# Patient Record
Sex: Male | Born: 1950
Health system: Southern US, Community
[De-identification: ages and names within clinical notes are randomized; demographics above are authoritative.]

## PROBLEM LIST (undated history)

## (undated) DIAGNOSIS — K219 Gastro-esophageal reflux disease without esophagitis: Secondary | ICD-10-CM

## (undated) DIAGNOSIS — Z950 Presence of cardiac pacemaker: Secondary | ICD-10-CM

## (undated) DIAGNOSIS — E7800 Pure hypercholesterolemia, unspecified: Secondary | ICD-10-CM

## (undated) DIAGNOSIS — I10 Essential (primary) hypertension: Secondary | ICD-10-CM

## (undated) DIAGNOSIS — I4891 Unspecified atrial fibrillation: Secondary | ICD-10-CM

## (undated) DIAGNOSIS — E669 Obesity, unspecified: Secondary | ICD-10-CM

## (undated) DIAGNOSIS — C801 Malignant (primary) neoplasm, unspecified: Secondary | ICD-10-CM

## (undated) HISTORY — PX: CHOLECYSTECTOMY: SHX55

## (undated) HISTORY — PX: APPENDECTOMY: SHX54

## (undated) HISTORY — PX: ESOPHAGEAL DILATION: SHX303

## (undated) HISTORY — PX: JOINT REPLACEMENT: SHX530

---

## 1999-03-16 ENCOUNTER — Encounter: Payer: Self-pay | Admitting: Internal Medicine

## 1999-03-16 ENCOUNTER — Ambulatory Visit (HOSPITAL_COMMUNITY): Admission: RE | Admit: 1999-03-16 | Discharge: 1999-03-16 | Payer: Self-pay | Admitting: Internal Medicine

## 2001-03-24 ENCOUNTER — Ambulatory Visit (HOSPITAL_COMMUNITY): Admission: RE | Admit: 2001-03-24 | Discharge: 2001-03-24 | Payer: Self-pay | Admitting: Internal Medicine

## 2001-03-25 ENCOUNTER — Encounter (INDEPENDENT_AMBULATORY_CARE_PROVIDER_SITE_OTHER): Payer: Self-pay | Admitting: Internal Medicine

## 2001-03-25 ENCOUNTER — Ambulatory Visit (HOSPITAL_COMMUNITY): Admission: RE | Admit: 2001-03-25 | Discharge: 2001-03-25 | Payer: Self-pay | Admitting: Internal Medicine

## 2004-09-01 ENCOUNTER — Ambulatory Visit: Payer: Self-pay | Admitting: Family Medicine

## 2004-09-04 ENCOUNTER — Ambulatory Visit (HOSPITAL_COMMUNITY): Admission: RE | Admit: 2004-09-04 | Discharge: 2004-09-04 | Payer: Self-pay | Admitting: Family Medicine

## 2004-09-07 ENCOUNTER — Ambulatory Visit: Payer: Self-pay | Admitting: Family Medicine

## 2004-10-03 ENCOUNTER — Ambulatory Visit: Payer: Self-pay | Admitting: Family Medicine

## 2005-06-14 ENCOUNTER — Emergency Department (HOSPITAL_COMMUNITY): Admission: EM | Admit: 2005-06-14 | Discharge: 2005-06-14 | Payer: Self-pay | Admitting: Emergency Medicine

## 2009-07-21 ENCOUNTER — Emergency Department (HOSPITAL_COMMUNITY): Admission: EM | Admit: 2009-07-21 | Discharge: 2009-07-21 | Payer: Self-pay | Admitting: Emergency Medicine

## 2009-07-31 ENCOUNTER — Emergency Department (HOSPITAL_COMMUNITY): Admission: EM | Admit: 2009-07-31 | Discharge: 2009-07-31 | Payer: Self-pay | Admitting: Emergency Medicine

## 2010-05-15 ENCOUNTER — Emergency Department (HOSPITAL_COMMUNITY)
Admission: EM | Admit: 2010-05-15 | Discharge: 2010-05-15 | Payer: Self-pay | Source: Home / Self Care | Admitting: Emergency Medicine

## 2010-05-22 LAB — URINE CULTURE
Colony Count: NO GROWTH
Culture  Setup Time: 201201102223
Culture: NO GROWTH

## 2010-05-22 LAB — COMPREHENSIVE METABOLIC PANEL
ALT: 17 U/L (ref 0–53)
AST: 25 U/L (ref 0–37)
Albumin: 3.9 g/dL (ref 3.5–5.2)
Alkaline Phosphatase: 94 U/L (ref 39–117)
BUN: 11 mg/dL (ref 6–23)
CO2: 29 mEq/L (ref 19–32)
Calcium: 9.6 mg/dL (ref 8.4–10.5)
Chloride: 99 mEq/L (ref 96–112)
Creatinine, Ser: 0.69 mg/dL (ref 0.4–1.5)
GFR calc Af Amer: >60 mL/min (ref >60)
GFR calc non Af Amer: >60 mL/min (ref >60)
Glucose, Bld: 102 mg/dL — ABNORMAL HIGH (ref 70–99)
Potassium: 3.7 mEq/L (ref 3.5–5.1)
Sodium: 138 mEq/L (ref 135–145)
Total Bilirubin: 0.7 mg/dL (ref 0.3–1.2)
Total Protein: 8.5 g/dL — ABNORMAL HIGH (ref 6.0–8.3)

## 2010-05-22 LAB — URINALYSIS, ROUTINE W REFLEX MICROSCOPIC
Bilirubin Urine: NEGATIVE
Hgb urine dipstick: NEGATIVE
Ketones, ur: NEGATIVE mg/dL
Nitrite: NEGATIVE
Protein, ur: NEGATIVE mg/dL
Specific Gravity, Urine: 1.015 (ref 1.005–1.030)
Urine Glucose, Fasting: NEGATIVE mg/dL
Urobilinogen, UA: 1 mg/dL (ref 0.0–1.0)
pH: 5.5 (ref 5.0–8.0)

## 2010-05-22 LAB — APTT: aPTT: 37 seconds (ref 24–37)

## 2010-05-22 LAB — CBC
HCT: 45.3 % (ref 39.0–52.0)
Hemoglobin: 15.7 g/dL (ref 13.0–17.0)
MCH: 28.1 pg (ref 26.0–34.0)
MCHC: 34.7 g/dL (ref 30.0–36.0)
MCV: 81.2 fL (ref 78.0–100.0)
Platelets: 325 10*3/uL (ref 150–400)
RBC: 5.58 MIL/uL (ref 4.22–5.81)
RDW: 14 % (ref 11.5–15.5)
WBC: 9 10*3/uL (ref 4.0–10.5)

## 2010-05-22 LAB — DIFFERENTIAL
Basophils Absolute: 0.1 10*3/uL (ref 0.0–0.1)
Basophils Relative: 1 % (ref 0–1)
Eosinophils Absolute: 0.1 10*3/uL (ref 0.0–0.7)
Eosinophils Relative: 1 % (ref 0–5)
Lymphocytes Relative: 38 % (ref 12–46)
Lymphs Abs: 3.4 10*3/uL (ref 0.7–4.0)
Monocytes Absolute: 0.6 10*3/uL (ref 0.1–1.0)
Monocytes Relative: 7 % (ref 3–12)
Neutro Abs: 4.9 10*3/uL (ref 1.7–7.7)
Neutrophils Relative %: 54 % (ref 43–77)

## 2010-05-22 LAB — PROTIME-INR
INR: 1.01 (ref 0.00–1.49)
Prothrombin Time: 13.5 seconds (ref 11.6–15.2)

## 2010-07-30 LAB — URINALYSIS, ROUTINE W REFLEX MICROSCOPIC
Bilirubin Urine: NEGATIVE
Glucose, UA: NEGATIVE mg/dL
Hgb urine dipstick: NEGATIVE
Ketones, ur: NEGATIVE mg/dL
Nitrite: NEGATIVE
Protein, ur: NEGATIVE mg/dL
Specific Gravity, Urine: 1.02 (ref 1.005–1.030)
Urobilinogen, UA: 2 mg/dL — ABNORMAL HIGH (ref 0.0–1.0)
pH: 6.5 (ref 5.0–8.0)

## 2011-08-17 ENCOUNTER — Emergency Department (HOSPITAL_COMMUNITY)
Admission: EM | Admit: 2011-08-17 | Discharge: 2011-08-17 | Disposition: A | Discharge status: Home / Self Care | Payer: Self-pay | Attending: Emergency Medicine | Admitting: Emergency Medicine

## 2011-08-17 ENCOUNTER — Encounter (HOSPITAL_COMMUNITY): Payer: Self-pay | Admitting: Emergency Medicine

## 2011-08-17 DIAGNOSIS — M542 Cervicalgia: Secondary | ICD-10-CM | POA: Insufficient documentation

## 2011-08-17 DIAGNOSIS — T148XXA Other injury of unspecified body region, initial encounter: Secondary | ICD-10-CM | POA: Insufficient documentation

## 2011-08-17 DIAGNOSIS — M25519 Pain in unspecified shoulder: Secondary | ICD-10-CM | POA: Insufficient documentation

## 2011-08-17 DIAGNOSIS — X503XXA Overexertion from repetitive movements, initial encounter: Secondary | ICD-10-CM | POA: Insufficient documentation

## 2011-08-17 MED ORDER — CYCLOBENZAPRINE HCL 10 MG PO TABS: ORAL_TABLET | ORAL | Status: DC

## 2011-08-17 MED ORDER — PREDNISONE 20 MG PO TABS
60.0000 mg | ORAL_TABLET | Freq: Once | ORAL | Status: AC
  Administered 2011-08-17: 60 mg via ORAL
  Filled 2011-08-17: qty 3

## 2011-08-17 MED ORDER — PREDNISONE 50 MG PO TABS: ORAL_TABLET | ORAL | Status: DC

## 2011-08-17 MED ORDER — CYCLOBENZAPRINE HCL 10 MG PO TABS
10.0000 mg | ORAL_TABLET | Freq: Once | ORAL | Status: AC
  Administered 2011-08-17: 10 mg via ORAL
  Filled 2011-08-17: qty 1

## 2011-08-17 NOTE — ED Notes (Signed)
Pt c/o stiff neck and shoulder pain since mowing his yard Wed.

## 2011-08-17 NOTE — Discharge Instructions (Signed)
Cryotherapy Cryotherapy means treatment with cold. Ice or gel packs can be used to reduce both pain and swelling. Ice is the most helpful within the first 24 to 48 hours after an injury or flareup from overusing a muscle or joint. Sprains, strains, spasms, burning pain, shooting pain, and aches can all be eased with ice. Ice can also be used when recovering from surgery. Ice is effective, has very few side effects, and is safe for most people to use. PRECAUTIONS  Ice is not a safe treatment option for people with:  Raynaud's phenomenon. This is a condition affecting small blood vessels in the extremities. Exposure to cold may cause your problems to return.   Cold hypersensitivity. There are many forms of cold hypersensitivity, including:   Cold urticaria. Red, itchy hives appear on the skin when the tissues begin to warm after being iced.   Cold erythema. This is a red, itchy rash caused by exposure to cold.   Cold hemoglobinuria. Red blood cells break down when the tissues begin to warm after being iced. The hemoglobin that carry oxygen are passed into the urine because they cannot combine with blood proteins fast enough.   Numbness or altered sensitivity in the area being iced.  If you have any of the following conditions, do not use ice until you have discussed cryotherapy with your caregiver:  Heart conditions, such as arrhythmia, angina, or chronic heart disease.   High blood pressure.   Healing wounds or open skin in the area being iced.   Current infections.   Rheumatoid arthritis.   Poor circulation.   Diabetes.  Ice slows the blood flow in the region it is applied. This is beneficial when trying to stop inflamed tissues from spreading irritating chemicals to surrounding tissues. However, if you expose your skin to cold temperatures for too long or without the proper protection, you can damage your skin or nerves. Watch for signs of skin damage due to cold. HOME CARE  INSTRUCTIONS Follow these tips to use ice and cold packs safely.  Place a dry or damp towel between the ice and skin. A damp towel will cool the skin more quickly, so you may need to shorten the time that the ice is used.   For a more rapid response, add gentle compression to the ice.   Ice for no more than 10 to 20 minutes at a time. The bonier the area you are icing, the less time it will take to get the benefits of ice.   Check your skin after 5 minutes to make sure there are no signs of a poor response to cold or skin damage.   Rest 20 minutes or more in between uses.   Once your skin is numb, you can end your treatment. You can test numbness by very lightly touching your skin. The touch should be so light that you do not see the skin dimple from the pressure of your fingertip. When using ice, most people will feel these normal sensations in this order: cold, burning, aching, and numbness.   Do not use ice on someone who cannot communicate their responses to pain, such as small children or people with dementia.  HOW TO MAKE AN ICE PACK Ice packs are the most common way to use ice therapy. Other methods include ice massage, ice baths, and cryo-sprays. Muscle creams that cause a cold, tingly feeling do not offer the same benefits that ice offers and should not be used as a substitute  unless recommended by your caregiver. To make an ice pack, do one of the following:  Place crushed ice or a bag of frozen vegetables in a sealable plastic bag. Squeeze out the excess air. Place this bag inside another plastic bag. Slide the bag into a pillowcase or place a damp towel between your skin and the bag.   Mix 3 parts water with 1 part rubbing alcohol. Freeze the mixture in a sealable plastic bag. When you remove the mixture from the freezer, it will be slushy. Squeeze out the excess air. Place this bag inside another plastic bag. Slide the bag into a pillowcase or place a damp towel between your skin  and the bag.  SEEK MEDICAL CARE IF:  You develop white spots on your skin. This may give the skin a blotchy (mottled) appearance.   Your skin turns blue or pale.   Your skin becomes waxy or hard.   Your swelling gets worse.  MAKE SURE YOU: Muscle Strain A muscle strain, or pulled muscle, occurs when a muscle is over-stretched. A small number of muscle fibers may also be torn. This is especially common in athletes. This happens when a sudden violent force placed on a muscle pushes it past its capacity. Usually, recovery from a pulled muscle takes 1 to 2 weeks. But complete healing will take 5 to 6 weeks. There are millions of muscle fibers. Following injury, your body will usually return to normal quickly. HOME CARE INSTRUCTIONS   While awake, apply ice to the sore muscle for 15 to 20 minutes each hour for the first 2 days. Put ice in a plastic bag and place a towel between the bag of ice and your skin.   Do not use the pulled muscle for several days. Do not use the muscle if you have pain.   You may wrap the injured area with an elastic bandage for comfort. Be careful not to bind it too tightly. This may interfere with blood circulation.   Only take over-the-counter or prescription medicines for pain, discomfort, or fever as directed by your caregiver. Do not use aspirin as this will increase bleeding (bruising) at injury site.   Warming up before exercise helps prevent muscle strains.  SEEK MEDICAL CARE IF:  There is increased pain or swelling in the affected area. MAKE SURE YOU:   Understand these instructions.   Will watch your condition.   Will get help right away if you are not doing well or get worse.  Document Released: 04/23/2005 Document Revised: 04/12/2011 Document Reviewed: 11/20/2006 [facility] Patient Information 2012 [place], [place].  Understand these instructions.   Will watch your condition.   Will get help right away if you are not doing well or get worse.    Document Released: 12/18/2010 Document Revised: 04/12/2011 Document Reviewed: 12/18/2010 [facility] Patient Information 2012 [place], [place].   Take the meds as directed.  Do NOT take any NSAID's while taking the prednisone.  Apply cold compresses several times daily to areas of soreness.  Follow up with your PCP as needed.

## 2011-08-17 NOTE — ED Provider Notes (Signed)
History     CSN: [id number]  Arrival date & time 08/17/11  1539   None     Chief Complaint  Patient presents with  . Neck Pain  . Shoulder Pain    (Consider location/radiation/quality/duration/timing/severity/associated sxs/prior treatment) HPI Comments: Pain began after using a new riding lawn mower 2 days ago.  States he was hitting areas of collapsed moles tunnels and jostling his upper body.  Took ibuprofen without relief.  No borderline DM.  No taking and SSRI's.  Patient is a 61 y.o. male presenting with neck pain and shoulder pain. The history is provided by the patient. No language interpreter was used.  Neck Pain  This is a new problem. Episode onset: 2 days. The problem occurs constantly. The problem has not changed since onset.There has been no fever. The pain radiates to the left shoulder and right shoulder (lateral neck). The pain is moderate. He has tried NSAIDs for the symptoms. The treatment provided no relief.  Shoulder Pain Associated symptoms include neck pain.    History reviewed. No pertinent past medical history.  History reviewed. No pertinent past surgical history.  History reviewed. No pertinent family history.  History  Substance Use Topics  . Smoking status: Not on file  . Smokeless tobacco: Not on file  . Alcohol Use: No      Review of Systems  HENT: Positive for neck pain.   Musculoskeletal:       B shoulder pain  All other systems reviewed and are negative.    Allergies  Adhesive and Sulfa antibiotics  Home Medications   Current Outpatient Rx  Name Route Sig Dispense Refill  . OMEPRAZOLE-SODIUM BICARBONATE 20-1100 MG PO CAPS Oral Take 1 capsule by mouth daily before breakfast.      BP 147/85  Pulse 73  Temp(Src) 97.4 F (36.3 C) (Oral)  Resp 20  Ht 6\' 5"  (1.956 m)  Wt 250 lb (113.399 kg)  BMI 29.65 kg/m2  SpO2 96%  Physical Exam  Nursing note and vitals reviewed. Constitutional: He is oriented to person, place, and  time. He appears well-developed and well-nourished. No distress.  HENT:  Head: Normocephalic and atraumatic.  Eyes: EOM are normal.  Neck: Trachea normal. Muscular tenderness present. No spinous process tenderness present. Erythema and decreased range of motion present.         B lateral muscle pain extending down into muscular areas of shoulders.  No vertebral or bony shoulder pain or PT.  Pain primarily with movement.  Cardiovascular: Normal rate, regular rhythm, normal heart sounds and intact distal pulses.   Pulmonary/Chest: Effort normal and breath sounds normal. No respiratory distress.  Abdominal: Soft. He exhibits no distension. There is no tenderness.  Musculoskeletal: He exhibits tenderness.  Neurological: He is alert and oriented to person, place, and time. No cranial nerve deficit or sensory deficit. Coordination and gait normal. GCS eye subscore is 4. GCS verbal subscore is 5. GCS motor subscore is 6.  Reflex Scores:      Tricep reflexes are 2+ on the right side and 2+ on the left side.      Bicep reflexes are 2+ on the right side and 2+ on the left side.      Brachioradialis reflexes are 2+ on the right side and 2+ on the left side. Skin: Skin is warm and dry. He is not diaphoretic.  Psychiatric: He has a normal mood and affect. Judgment normal.    ED Course  Procedures (including critical care time)  Labs Reviewed - No data to display No results found.   No diagnosis found.    MDM  rx-flexeril 10 mg, 20 rx prednisone 50 mg, 6 Ice F/u your PCP as needed.        Worthy Rancher, PA 08/17/11 [phone]

## 2011-08-17 NOTE — ED Notes (Signed)
Pt states mowed his lawn yesterday and awoke this morning with a stiff neck and sore shoulders. Pt denies injury at this time. Pt refers to pain as " stiff and painful with movement". Pt denies vision change, N/V and headache.

## 2011-08-18 NOTE — ED Provider Notes (Signed)
Medical screening examination/treatment/procedure(s) were performed by non-physician practitioner and as supervising physician I was immediately available for consultation/collaboration.  Shelda Jakes, MD 08/18/11 [phone]

## 2015-04-27 ENCOUNTER — Inpatient Hospital Stay (HOSPITAL_COMMUNITY): Payer: Medicare Other

## 2015-04-27 ENCOUNTER — Encounter (HOSPITAL_COMMUNITY): Payer: Self-pay

## 2015-04-27 ENCOUNTER — Inpatient Hospital Stay (HOSPITAL_COMMUNITY)
Admission: EM | Admit: 2015-04-27 | Discharge: 2015-04-29 | DRG: 310 | Disposition: A | Discharge status: Home / Self Care | Payer: Medicare Other | Attending: Internal Medicine | Admitting: Internal Medicine

## 2015-04-27 ENCOUNTER — Emergency Department (HOSPITAL_COMMUNITY): Payer: Medicare Other

## 2015-04-27 DIAGNOSIS — K219 Gastro-esophageal reflux disease without esophagitis: Secondary | ICD-10-CM | POA: Diagnosis present

## 2015-04-27 DIAGNOSIS — E039 Hypothyroidism, unspecified: Secondary | ICD-10-CM | POA: Diagnosis present

## 2015-04-27 DIAGNOSIS — I4891 Unspecified atrial fibrillation: Principal | ICD-10-CM

## 2015-04-27 DIAGNOSIS — Z23 Encounter for immunization: Secondary | ICD-10-CM | POA: Diagnosis not present

## 2015-04-27 DIAGNOSIS — G4733 Obstructive sleep apnea (adult) (pediatric): Secondary | ICD-10-CM | POA: Diagnosis present

## 2015-04-27 DIAGNOSIS — R269 Unspecified abnormalities of gait and mobility: Secondary | ICD-10-CM | POA: Diagnosis not present

## 2015-04-27 DIAGNOSIS — I481 Persistent atrial fibrillation: Secondary | ICD-10-CM

## 2015-04-27 DIAGNOSIS — I10 Essential (primary) hypertension: Secondary | ICD-10-CM | POA: Diagnosis present

## 2015-04-27 DIAGNOSIS — Z6837 Body mass index (BMI) 37.0-37.9, adult: Secondary | ICD-10-CM

## 2015-04-27 DIAGNOSIS — E669 Obesity, unspecified: Secondary | ICD-10-CM | POA: Diagnosis present

## 2015-04-27 DIAGNOSIS — Z8249 Family history of ischemic heart disease and other diseases of the circulatory system: Secondary | ICD-10-CM | POA: Diagnosis not present

## 2015-04-27 DIAGNOSIS — E876 Hypokalemia: Secondary | ICD-10-CM | POA: Diagnosis not present

## 2015-04-27 DIAGNOSIS — I452 Bifascicular block: Secondary | ICD-10-CM | POA: Diagnosis present

## 2015-04-27 DIAGNOSIS — R42 Dizziness and giddiness: Secondary | ICD-10-CM | POA: Diagnosis not present

## 2015-04-27 DIAGNOSIS — E860 Dehydration: Secondary | ICD-10-CM

## 2015-04-27 DIAGNOSIS — I4510 Unspecified right bundle-branch block: Secondary | ICD-10-CM | POA: Diagnosis present

## 2015-04-27 DIAGNOSIS — R05 Cough: Secondary | ICD-10-CM | POA: Diagnosis not present

## 2015-04-27 DIAGNOSIS — I440 Atrioventricular block, first degree: Secondary | ICD-10-CM | POA: Diagnosis present

## 2015-04-27 DIAGNOSIS — I453 Trifascicular block: Secondary | ICD-10-CM | POA: Diagnosis present

## 2015-04-27 DIAGNOSIS — I480 Paroxysmal atrial fibrillation: Secondary | ICD-10-CM | POA: Diagnosis present

## 2015-04-27 DIAGNOSIS — I444 Left anterior fascicular block: Secondary | ICD-10-CM | POA: Diagnosis present

## 2015-04-27 HISTORY — DX: Gastro-esophageal reflux disease without esophagitis: K21.9

## 2015-04-27 HISTORY — DX: Obesity, unspecified: E66.9

## 2015-04-27 LAB — CBC WITH DIFFERENTIAL/PLATELET
Basophils Absolute: 0.1 10*3/uL (ref 0.0–0.1)
Basophils Relative: 1 %
Eosinophils Absolute: 0.1 10*3/uL (ref 0.0–0.7)
Eosinophils Relative: 1 %
HCT: 51 % (ref 39.0–52.0)
Hemoglobin: 17.9 g/dL — ABNORMAL HIGH (ref 13.0–17.0)
Lymphocytes Relative: 34 %
Lymphs Abs: 3.5 10*3/uL (ref 0.7–4.0)
MCH: 29.2 pg (ref 26.0–34.0)
MCHC: 35.1 g/dL (ref 30.0–36.0)
MCV: 83.2 fL (ref 78.0–100.0)
Monocytes Absolute: 0.7 10*3/uL (ref 0.1–1.0)
Monocytes Relative: 7 %
Neutro Abs: 6 10*3/uL (ref 1.7–7.7)
Neutrophils Relative %: 57 %
Platelets: 302 10*3/uL (ref 150–400)
RBC: 6.13 MIL/uL — ABNORMAL HIGH (ref 4.22–5.81)
RDW: 14 % (ref 11.5–15.5)
WBC: 10.4 10*3/uL (ref 4.0–10.5)

## 2015-04-27 LAB — TROPONIN I
TROPONIN I: 0.04 ng/mL — AB (ref <0.031)
Troponin I: 0.04 ng/mL — ABNORMAL HIGH (ref <0.031)
Troponin I: 0.04 ng/mL — ABNORMAL HIGH (ref <0.031)

## 2015-04-27 LAB — COMPREHENSIVE METABOLIC PANEL
ALT: 21 U/L (ref 17–63)
AST: 34 U/L (ref 15–41)
Albumin: 4 g/dL (ref 3.5–5.0)
Alkaline Phosphatase: 84 U/L (ref 38–126)
Anion gap: 10 (ref 5–15)
BUN: 12 mg/dL (ref 6–20)
CO2: 32 mmol/L (ref 22–32)
Calcium: 9.4 mg/dL (ref 8.9–10.3)
Chloride: 97 mmol/L — ABNORMAL LOW (ref 101–111)
Creatinine, Ser: 0.89 mg/dL (ref 0.61–1.24)
GFR calc Af Amer: >60 mL/min (ref >60)
GFR calc non Af Amer: >60 mL/min (ref >60)
Glucose, Bld: 111 mg/dL — ABNORMAL HIGH (ref 65–99)
Potassium: 2.9 mmol/L — ABNORMAL LOW (ref 3.5–5.1)
Sodium: 139 mmol/L (ref 135–145)
Total Bilirubin: 1.2 mg/dL (ref 0.3–1.2)
Total Protein: 8.3 g/dL — ABNORMAL HIGH (ref 6.5–8.1)

## 2015-04-27 LAB — TSH
TSH: 10.503 u[IU]/mL — ABNORMAL HIGH (ref 0.350–4.500)
TSH: 6.505 u[IU]/mL — ABNORMAL HIGH (ref 0.350–4.500)

## 2015-04-27 LAB — MAGNESIUM: Magnesium: 2.1 mg/dL (ref 1.7–2.4)

## 2015-04-27 MED ORDER — ENOXAPARIN SODIUM 40 MG/0.4ML ~~LOC~~ SOLN
40.0000 mg | SUBCUTANEOUS | Status: DC
  Administered 2015-04-27 – 2015-04-28 (×2): 40 mg via SUBCUTANEOUS
  Filled 2015-04-27 (×2): qty 0.4

## 2015-04-27 MED ORDER — OMEPRAZOLE MAGNESIUM 20 MG PO TBEC: 20.0000 mg | DELAYED_RELEASE_TABLET | Freq: Every day | ORAL | Status: DC

## 2015-04-27 MED ORDER — PNEUMOCOCCAL VAC POLYVALENT 25 MCG/0.5ML IJ INJ
0.5000 mL | INJECTION | INTRAMUSCULAR | Status: AC
  Administered 2015-04-28: 0.5 mL via INTRAMUSCULAR
  Filled 2015-04-27: qty 0.5

## 2015-04-27 MED ORDER — SODIUM CHLORIDE 0.9 % IJ SOLN
3.0000 mL | Freq: Two times a day (BID) | INTRAMUSCULAR | Status: DC
  Administered 2015-04-29: 3 mL via INTRAVENOUS

## 2015-04-27 MED ORDER — SODIUM CHLORIDE 0.9 % IV BOLUS (SEPSIS)
500.0000 mL | Freq: Once | INTRAVENOUS | Status: AC
  Administered 2015-04-27: 500 mL via INTRAVENOUS

## 2015-04-27 MED ORDER — POTASSIUM CHLORIDE IN NACL 40-0.9 MEQ/L-% IV SOLN
INTRAVENOUS | Status: DC
  Administered 2015-04-27 – 2015-04-28 (×2): 75 mL/h via INTRAVENOUS

## 2015-04-27 MED ORDER — ONDANSETRON HCL 4 MG/2ML IJ SOLN: 4.0000 mg | Freq: Four times a day (QID) | INTRAMUSCULAR | Status: DC | PRN

## 2015-04-27 MED ORDER — ONDANSETRON HCL 4 MG PO TABS: 4.0000 mg | ORAL_TABLET | Freq: Four times a day (QID) | ORAL | Status: DC | PRN

## 2015-04-27 MED ORDER — INFLUENZA VAC SPLIT QUAD 0.5 ML IM SUSY
0.5000 mL | PREFILLED_SYRINGE | INTRAMUSCULAR | Status: AC
  Administered 2015-04-28: 0.5 mL via INTRAMUSCULAR
  Filled 2015-04-27: qty 0.5

## 2015-04-27 MED ORDER — PANTOPRAZOLE SODIUM 40 MG PO TBEC
40.0000 mg | DELAYED_RELEASE_TABLET | Freq: Every day | ORAL | Status: DC
  Administered 2015-04-27 – 2015-04-29 (×3): 40 mg via ORAL
  Filled 2015-04-27 (×3): qty 1

## 2015-04-27 NOTE — Consult Note (Signed)
Edmund A. Merlene Laughter, MD     [email or web address]          Roy [name] is an 64 y.o. male.   ASSESSMENT/PLAN: This setting of new onset atrial fibrillation. I think this is most likely etiology. His examination seems normal at this point in time. He certainly could have a small infarct not seen on CT scan involving the posterior circulation by think this is unlikely.  CHADS-VASC score 1  Likely on diagnosed obstructive sleep apnea syndrome.  RECOMMENDATION: Aspirin 81 mg daily. Outpatient sleep study.  The patient is 64 year old white male who presents with about a 3 day history of dizziness. The dizziness is described as gait instability and lightheadedness. The patient denies spinning. The dizziness is worse when he gets up and walk around after several minutes. The patient does not report focal numbness, weakness, dysarthria or dysphagia. There is no chest pain or shortness of breath. There is no palpitation. He does report a generalized sense of being fatigued and weak. The patient was diagnosed with new onset atrial fibrillation today. There is no previous history of stroke, TIA or coronary events. The review of systems otherwise negative. Patient does report a strong family history of atrial fibrillation in multiple family members. CT scan admission is negative. MRI was attempted but the patient appears to be quite claustrophobic and refused to have the testing done.  GENERAL: Very pleasant male in no acute distress.  HEENT: Supple. Atraumatic normocephalic. Large stocky neck.  ABDOMEN: soft  EXTREMITIES: No edema   BACK: Normal.  SKIN: Normal by inspection.    MENTAL STATUS: Alert and oriented. Speech, language and cognition are generally intact. Judgment and insight normal.   CRANIAL NERVES: Pupils are equal, round and reactive to light and accommodation; extra ocular movements are full, there is no significant nystagmus; visual fields are full;  upper and lower facial muscles are normal in strength and symmetric, there is no flattening of the nasolabial folds; tongue is midline; uvula is midline; shoulder elevation is normal.  MOTOR: Normal tone, bulk and strength; no pronator drift.  COORDINATION: Left finger to nose is normal, right finger to nose is normal, No rest tremor; no intention tremor; no postural tremor; no bradykinesia.  REFLEXES: Deep tendon reflexes are symmetrical and normal. Babinski reflexes are flexor bilaterally.   SENSATION: Normal to light touch.  GAIT: The patient stands with minimal assistance and takes good steps with a relatively stable gait.    Blood pressure 141/94, pulse 78, temperature 97.8 F (36.6 C), temperature source Oral, resp. rate 18, height 6' (1.829 m), weight 125.646 kg (277 lb), SpO2 95 %.  History reviewed. No pertinent past medical history.  Past Surgical History  Procedure Laterality Date  . Joint replacement      No family history on file.  Social History:  reports that he has never smoked. He does not have any smokeless tobacco history on file. He reports that he does not drink alcohol or use illicit drugs.  Allergies:  Allergies  Allergen Reactions  . Adhesive [Tape]   . Sulfa Antibiotics     UNKNOWN Childhood reaction    Medications: Prior to Admission medications   Medication Sig Start Date End Date Taking? Authorizing Provider  omeprazole (PRILOSEC OTC) 20 MG tablet Take 20 mg by mouth daily.   Yes Historical Provider, MD    Scheduled Meds: . enoxaparin (LOVENOX) injection  40 mg Subcutaneous Q24H  . [START ON 04/28/2015] Influenza vac split quadrivalent PF  0.5 mL Intramuscular Tomorrow-1000  . pantoprazole  40 mg Oral Daily  . [START ON 04/28/2015] pneumococcal 23 valent vaccine  0.5 mL Intramuscular Tomorrow-1000  . sodium chloride  3 mL Intravenous Q12H   Continuous Infusions: . 0.9 % NaCl with KCl 40 mEq / L 75 mL/hr (04/27/15 1746)   PRN  Meds:.ondansetron **OR** ondansetron (ZOFRAN) IV     Results for orders placed or performed during the hospital encounter of 04/27/15 (from the past 48 hour(s))  Comprehensive metabolic panel     Status: Abnormal   Collection Time: 04/27/15  1:36 PM  Result Value Ref Range   Sodium 139 135 - 145 mmol/L   Potassium 2.9 (L) 3.5 - 5.1 mmol/L   Chloride 97 (L) 101 - 111 mmol/L   CO2 32 22 - 32 mmol/L   Glucose, Bld 111 (H) 65 - 99 mg/dL   BUN 12 6 - 20 mg/dL   Creatinine, Ser 0.89 0.61 - 1.24 mg/dL   Calcium 9.4 8.9 - 10.3 mg/dL   Total Protein 8.3 (H) 6.5 - 8.1 g/dL   Albumin 4.0 3.5 - 5.0 g/dL   AST 34 15 - 41 U/L   ALT 21 17 - 63 U/L   Alkaline Phosphatase 84 38 - 126 U/L   Total Bilirubin 1.2 0.3 - 1.2 mg/dL   GFR calc non Af Amer >60 >60 mL/min   GFR calc Af Amer >60 >60 mL/min    Comment: (NOTE) The eGFR has been calculated using the CKD EPI equation. This calculation has not been validated in all clinical situations. eGFR's persistently <60 mL/min signify possible Chronic Kidney Disease.    Anion gap 10 5 - 15  Troponin I     Status: Abnormal   Collection Time: 04/27/15  1:36 PM  Result Value Ref Range   Troponin I 0.04 (H) <0.031 ng/mL    Comment:        PERSISTENTLY INCREASED TROPONIN VALUES IN THE RANGE OF 0.04-0.49 ng/mL CAN BE SEEN IN:       -UNSTABLE ANGINA       -CONGESTIVE HEART FAILURE       -MYOCARDITIS       -CHEST TRAUMA       -ARRYHTHMIAS       -LATE PRESENTING MYOCARDIAL INFARCTION       -COPD   CLINICAL FOLLOW-UP RECOMMENDED.   CBC with Differential     Status: Abnormal   Collection Time: 04/27/15  1:36 PM  Result Value Ref Range   WBC 10.4 4.0 - 10.5 K/uL   RBC 6.13 (H) 4.22 - 5.81 MIL/uL   Hemoglobin 17.9 (H) 13.0 - 17.0 g/dL   HCT 51.0 39.0 - 52.0 %   MCV 83.2 78.0 - 100.0 fL   MCH 29.2 26.0 - 34.0 pg   MCHC 35.1 30.0 - 36.0 g/dL   RDW 14.0 11.5 - 15.5 %   Platelets 302 150 - 400 K/uL   Neutrophils Relative % 57 %   Neutro Abs 6.0 1.7  - 7.7 K/uL   Lymphocytes Relative 34 %   Lymphs Abs 3.5 0.7 - 4.0 K/uL   Monocytes Relative 7 %   Monocytes Absolute 0.7 0.1 - 1.0 K/uL   Eosinophils Relative 1 %   Eosinophils Absolute 0.1 0.0 - 0.7 K/uL   Basophils Relative 1 %   Basophils Absolute 0.1 0.0 - 0.1 K/uL  Magnesium     Status: None   Collection Time: 04/27/15  1:36 PM  Result Value Ref  Range   Magnesium 2.1 1.7 - 2.4 mg/dL  TSH     Status: Abnormal   Collection Time: 04/27/15  1:37 PM  Result Value Ref Range   TSH 10.503 (H) 0.350 - 4.500 uIU/mL  Troponin I     Status: Abnormal   Collection Time: 04/27/15  4:38 PM  Result Value Ref Range   Troponin I 0.04 (H) <0.031 ng/mL    Comment:        PERSISTENTLY INCREASED TROPONIN VALUES IN THE RANGE OF 0.04-0.49 ng/mL CAN BE SEEN IN:       -UNSTABLE ANGINA       -CONGESTIVE HEART FAILURE       -MYOCARDITIS       -CHEST TRAUMA       -ARRYHTHMIAS       -LATE PRESENTING MYOCARDIAL INFARCTION       -COPD   CLINICAL FOLLOW-UP RECOMMENDED.     Studies/Results:     Alija Riano A. Merlene Laughter, M.D.  Diplomate, Tax adviser of Psychiatry and Neurology ( Neurology). 04/27/2015, 6:04 PM

## 2015-04-27 NOTE — ED Provider Notes (Signed)
CSN: [id number]     Arrival date & time 04/27/15  1228 History   First MD Initiated Contact with Patient 04/27/15 1304     Chief Complaint  Patient presents with  . Dizziness     (Consider location/radiation/quality/duration/timing/severity/associated sxs/prior Treatment) Patient is a 64 y.o. male presenting with dizziness. The history is provided by the patient.  Dizziness Associated symptoms: no chest pain, no headaches and no weakness    patient states he has felt dizzy for the last 5 days. States he feels a little unsteady. States he's had to use a cane. Mild shortness of breath with it. No cough. No fevers. States his head does feel congested. States his ears feel little full also. States he does not feel drunk. No chest pain. No nausea vomiting or diarrhea. He does have a history of a lazy eye.  History reviewed. No pertinent past medical history. Past Surgical History  Procedure Laterality Date  . Joint replacement     No family history on file. Social History  Substance Use Topics  . Smoking status: Never Smoker   . Smokeless tobacco: None  . Alcohol Use: No    Review of Systems  Constitutional: Negative for appetite change.  HENT: Positive for congestion.        Patient states he feels that there is water in his ears  Respiratory: Negative for chest tightness.   Cardiovascular: Negative for chest pain.  Gastrointestinal: Negative for abdominal pain.  Musculoskeletal: Negative for back pain.  Neurological: Positive for dizziness. Negative for syncope, speech difficulty, weakness and headaches.      Allergies  Adhesive and Sulfa antibiotics  Home Medications   Prior to Admission medications   Medication Sig Start Date End Date Taking? Authorizing Provider  omeprazole (PRILOSEC OTC) 20 MG tablet Take 20 mg by mouth daily.   Yes Historical Provider, MD   BP 117/78 mmHg  Pulse 54  Temp(Src) 97.5 F (36.4 C) (Oral)  Resp 18  Ht 6' (1.829 m)  Wt 277 lb  (125.646 kg)  BMI 37.56 kg/m2  SpO2 98% Physical Exam  Constitutional: He appears well-developed.  HENT:  Bilateral TMs without erythema.  Eyes:  Strabismus of left eye.  Neck: Neck supple.  Cardiovascular: Normal rate.   Irregular rhythm  Abdominal: Soft.  Musculoskeletal: Normal range of motion.  Neurological: He is alert.  Skin: Skin is warm.   finger-nose intact bilaterally. Some nystagmus with gaze to the left but does have a lazy eye on that side. Good heel-to-shin bilaterally. Normal gait. No Romberg.  ED Course  Procedures (including critical care time) Labs Review Labs Reviewed  COMPREHENSIVE METABOLIC PANEL - Abnormal; Notable for the following:    Potassium 2.9 (*)    Chloride 97 (*)    Glucose, Bld 111 (*)    Total Protein 8.3 (*)    All other components within normal limits  TROPONIN I - Abnormal; Notable for the following:    Troponin I 0.04 (*)    All other components within normal limits  CBC WITH DIFFERENTIAL/PLATELET - Abnormal; Notable for the following:    RBC 6.13 (*)    Hemoglobin 17.9 (*)    All other components within normal limits  TSH - Abnormal; Notable for the following:    TSH 10.503 (*)    All other components within normal limits  MAGNESIUM  TSH  TROPONIN I  TROPONIN I  TROPONIN I    Imaging Review Dg Chest 2 View  04/27/2015  CLINICAL  DATA:  Dizziness since Sunday.  Smoker, cough. EXAM: CHEST  2 VIEW COMPARISON:  None. FINDINGS: The heart size and mediastinal contours are within normal limits. Both lungs are clear. The visualized skeletal structures are unremarkable. IMPRESSION: No active cardiopulmonary disease. Electronically Signed   By: Rolm Baptise M.D.   On: 04/27/2015 13:57   I have personally reviewed and evaluated these images and lab results as part of my medical decision-making.   EKG Interpretation   Date/Time:  Wednesday April 27 2015 12:39:51 EST Ventricular Rate:  86 PR Interval:    QRS Duration: 163 QT  Interval:  428 QTC Calculation: 512 R Axis:   -72 Text Interpretation:  Atrial fibrillation RBBB and LAFB LVH by voltage  Confirmed by Alvino Chapel  MD, Ovid Curd [phone]) on 04/27/2015 12:45:59 PM      MDM   Final diagnoses:  New onset atrial fibrillation ([place])  Dehydration    Patient weakness/dizziness with new onset A. fib. Also appears somewhat dehydrated. Nonfocal neuro exam but stroke considered. May be feeling bad due to the A. fib. Will admit to internal medicine.    Davonna Belling, MD 04/27/15 1700

## 2015-04-27 NOTE — ED Notes (Signed)
Pt reports dizziness and feeling lightheaded since Sunday.  Pt says got better Monday but didn't completely go away.  Pt says feels like head is stopped up.

## 2015-04-27 NOTE — H&P (Signed)
[facility] History and Physical  Roy [name] [id number] DOB: 27-Dec-1950 DOA: 04/27/2015  Referring physician: ER PCP: Sherrie Mustache, MD   Chief Complaint: Dizziness  HPI: Roy [name] is a 64 y.o. male  This is a 64 year old man who gives a 4-5 day history of dizziness. He feels unsteady on his feet and thinks he is going to fall but has not done so. He has been using a cane to avoid falls. She denies any lightheadedness as if he was going to faint. He denies any chest pain or palpitations. There is no dyspnea. He does not have any double vision or slurred speech. There is no limb weakness. Evaluation in the emergency room shows him to have new onset atrial fibrillation which is rate controlled. He is now being admitted for further management.   Review of Systems:  Apart from symptoms above, all systems are negative.  History reviewed. No pertinent past medical history. Past Surgical History  Procedure Laterality Date  . Joint replacement     Social History:  reports that he has never smoked. He does not have any smokeless tobacco history on file. He reports that he does not drink alcohol or use illicit drugs.  Allergies  Allergen Reactions  . Adhesive [Tape]   . Sulfa Antibiotics     UNKNOWN Childhood reaction     Family history: His father and brothers have had irregular heart rates  Prior to Admission medications   Medication Sig Start Date End Date Taking? Authorizing Provider  omeprazole (PRILOSEC OTC) 20 MG tablet Take 20 mg by mouth daily.   Yes Historical Provider, MD   Physical Exam: Filed Vitals:   04/27/15 1230 04/27/15 1330 04/27/15 1400  BP: 165/73  107/85  Pulse: 83 67 66  Temp: 97.5 F (36.4 C)    TempSrc: Oral    Resp: 24 19   Height: 6' (1.829 m)    Weight: 125.646 kg (277 lb)    SpO2: 98% 97% 95%    Wt Readings from Last 3 Encounters:  04/27/15 125.646 kg (277 lb)  08/17/11 113.399 kg (250 lb)    General:  Appears  calm and comfortable Eyes: PERRL, normal lids, irises & conjunctiva ENT: grossly normal hearing, lips & tongue Neck: no LAD, masses or thyromegaly Cardiovascular: Irregularly irregular, consistent with atrial fibrillation. Telemetry: Atrial fibrillation. Ventricular rate is controlled. Respiratory: CTA bilaterally, no w/r/r. Normal respiratory effort. Abdomen: soft, ntnd Skin: no rash or induration seen on limited exam Musculoskeletal: grossly normal tone BUE/BLE Psychiatric: grossly normal mood and affect, speech fluent and appropriate Neurologic: grossly non-focal. in particular, there are no cerebellar signs.           Labs on Admission:  Basic Metabolic Panel:  Recent Labs Lab 04/27/15 1336  NA 139  K 2.9*  CL 97*  CO2 32  GLUCOSE 111*  BUN 12  CREATININE 0.89  CALCIUM 9.4  MG 2.1   Liver Function Tests:  Recent Labs Lab 04/27/15 1336  AST 34  ALT 21  ALKPHOS 84  BILITOT 1.2  PROT 8.3*  ALBUMIN 4.0   No results for input(s): LIPASE, AMYLASE in the last 168 hours. No results for input(s): AMMONIA in the last 168 hours. CBC:  Recent Labs Lab 04/27/15 1336  WBC 10.4  NEUTROABS 6.0  HGB 17.9*  HCT 51.0  MCV 83.2  PLT 302   Cardiac Enzymes:  Recent Labs Lab 04/27/15 1336  TROPONINI 0.04*    BNP (last 3 results) No results  for input(s): BNP in the last 8760 hours.  ProBNP (last 3 results) No results for input(s): PROBNP in the last 8760 hours.  CBG: No results for input(s): GLUCAP in the last 168 hours.  Radiological Exams on Admission: Dg Chest 2 View  04/27/2015  CLINICAL DATA:  Dizziness since Sunday.  Smoker, cough. EXAM: CHEST  2 VIEW COMPARISON:  None. FINDINGS: The heart size and mediastinal contours are within normal limits. Both lungs are clear. The visualized skeletal structures are unremarkable. IMPRESSION: No active cardiopulmonary disease. Electronically Signed   By: Rolm Baptise M.D.   On: 04/27/2015 13:57    EKG:  Independently reviewed. Atrial fibrillation without any acute ST-T wave changes.  Assessment/Plan   1. Dizziness. The etiology is unclear. He may have had a cerebellar event. I'm going to obtain MRI brain scan. I will last neurology to evaluate the patient. 2. New onset atrial fibrillation. The ventricular rate is controlled. It is difficult to know how long he has been in atrial fibrillation. Order echocardiogram. Cardiology consultation. 3. Elevated TSH. He may have hypothyroidism. We'll repeat the TSH. He may require Synthroid.  He'll be admitted, treated. Further recommendations will depend on patient's hospital progress.   Code Status: Full code.  DVT Prophylaxis: Lovenox.  Family Communication: I discussed the plan with the patient at the bedside.   Disposition Plan: Home when medically stable  Time spent: 45 minutes.  Doree Albee [facility] Pager [phone].

## 2015-04-28 ENCOUNTER — Encounter (HOSPITAL_COMMUNITY): Payer: Self-pay | Admitting: Adult Health

## 2015-04-28 ENCOUNTER — Inpatient Hospital Stay (HOSPITAL_COMMUNITY): Payer: Medicare Other

## 2015-04-28 DIAGNOSIS — I4891 Unspecified atrial fibrillation: Secondary | ICD-10-CM | POA: Diagnosis not present

## 2015-04-28 DIAGNOSIS — I4510 Unspecified right bundle-branch block: Secondary | ICD-10-CM

## 2015-04-28 DIAGNOSIS — I440 Atrioventricular block, first degree: Secondary | ICD-10-CM

## 2015-04-28 DIAGNOSIS — E876 Hypokalemia: Secondary | ICD-10-CM

## 2015-04-28 LAB — TROPONIN I
TROPONIN I: 0.03 ng/mL (ref <0.031)
TROPONIN I: 0.03 ng/mL (ref <0.031)

## 2015-04-28 LAB — COMPREHENSIVE METABOLIC PANEL
ALK PHOS: 71 U/L (ref 38–126)
ALT: 18 U/L (ref 17–63)
ANION GAP: 9 (ref 5–15)
AST: 26 U/L (ref 15–41)
Albumin: 3.5 g/dL (ref 3.5–5.0)
BILIRUBIN TOTAL: 1.5 mg/dL — AB (ref 0.3–1.2)
BUN: 10 mg/dL (ref 6–20)
CALCIUM: 9 mg/dL (ref 8.9–10.3)
CO2: 33 mmol/L — ABNORMAL HIGH (ref 22–32)
Chloride: 99 mmol/L — ABNORMAL LOW (ref 101–111)
Creatinine, Ser: 0.79 mg/dL (ref 0.61–1.24)
Glucose, Bld: 115 mg/dL — ABNORMAL HIGH (ref 65–99)
POTASSIUM: 2.7 mmol/L — AB (ref 3.5–5.1)
Sodium: 141 mmol/L (ref 135–145)
TOTAL PROTEIN: 7.3 g/dL (ref 6.5–8.1)

## 2015-04-28 LAB — CBC
HEMATOCRIT: 48.4 % (ref 39.0–52.0)
HEMOGLOBIN: 16.6 g/dL (ref 13.0–17.0)
MCH: 28.7 pg (ref 26.0–34.0)
MCHC: 34.3 g/dL (ref 30.0–36.0)
MCV: 83.7 fL (ref 78.0–100.0)
Platelets: 279 10*3/uL (ref 150–400)
RBC: 5.78 MIL/uL (ref 4.22–5.81)
RDW: 14 % (ref 11.5–15.5)
WBC: 9.6 10*3/uL (ref 4.0–10.5)

## 2015-04-28 LAB — MAGNESIUM: MAGNESIUM: 2 mg/dL (ref 1.7–2.4)

## 2015-04-28 LAB — T4, FREE: Free T4: 0.93 ng/dL (ref 0.61–1.12)

## 2015-04-28 MED ORDER — POTASSIUM CHLORIDE CRYS ER 20 MEQ PO TBCR
40.0000 meq | EXTENDED_RELEASE_TABLET | Freq: Once | ORAL | Status: AC
  Administered 2015-04-28: 40 meq via ORAL
  Filled 2015-04-28: qty 2

## 2015-04-28 MED ORDER — POTASSIUM CHLORIDE CRYS ER 20 MEQ PO TBCR
60.0000 meq | EXTENDED_RELEASE_TABLET | Freq: Four times a day (QID) | ORAL | Status: AC
  Administered 2015-04-28 (×2): 60 meq via ORAL
  Filled 2015-04-28 (×2): qty 3

## 2015-04-28 MED ORDER — POTASSIUM CHLORIDE 10 MEQ/100ML IV SOLN
10.0000 meq | INTRAVENOUS | Status: DC
  Administered 2015-04-28 (×2): 10 meq via INTRAVENOUS
  Filled 2015-04-28 (×2): qty 100

## 2015-04-28 NOTE — Consult Note (Signed)
CARDIOLOGY CONSULT NOTE   Patient ID: WILMA WUTHRICH MRN: [id number] DOB/AGE: Jul 03, 1950 63 y.o.  Admit Date: 04/27/2015 Referring Physician: [id number] Primary Physician: Sherrie Mustache, MD Consulting Cardiologist: Dorris Carnes MD Primary Cardiologist: New Reason for Consultation: New Onset Atrial fib  Clinical Summary Mr. Buxton is a 64 y.o.male with no prior cardiac history, who presented to ER with complaints of dizziness and found to be in atrial fib,, which is of uncertain length of time.  He denies palpitations  He states that he has been dizzy and lightheaded for 3 days prior to admission, waxing and waning with no precipitating or alleviating factors. Symptoms became worse on day of admission. He denies chest pain, Does note SOB with activity He admits to low energy over the last few months with some sluggishness. States he used to be active but now has to stop and rest often.   In ER BP 165/73 HR 83, O2 Sat 98%. Afebrile. TSH 10.503, and 6.505 respectively. Potassium of 2.9. Troponin of 0.03, 0.04, and 0.04 respectively. CXR was negative for pneumonia or CHF. EKG demonstrated atrial fib with  He was started on IV fluids with potassium and also given a potassium bolus. Magnesium 2.1. Repeat EKG demonstrated SR  Pt feeling better now    Allergies  Allergen Reactions  . Adhesive [Tape]   . Sulfa Antibiotics     UNKNOWN Childhood reaction    Medications Scheduled Medications: . enoxaparin (LOVENOX) injection  40 mg Subcutaneous Q24H  . pantoprazole  40 mg Oral Daily  . potassium chloride  60 mEq Oral Q6H  . sodium chloride  3 mL Intravenous Q12H    Infusions: . 0.9 % NaCl with KCl 40 mEq / L 75 mL/hr (04/28/15 0736)    PRN Medications: ondansetron **OR** ondansetron (ZOFRAN) IV   Past Medical History  Diagnosis Date  . GERD (gastroesophageal reflux disease)   . Obesity     Past Surgical History  Procedure Laterality Date  . Joint replacement    .  Cholecystectomy      Family History  Problem Relation Age of Onset  . Heart failure Father   . Thyroid disease Sister   . Heart attack Father     Deceased     Social History Mr. Trompeter reports that he has never smoked. He does not have any smokeless tobacco history on file. Mr. Bushway reports that he does not drink alcohol.  Review of Systems Complete review of systems are found to be negative unless outlined in H&P above.  Physical Examination Blood pressure 147/88, pulse 83, temperature 97.8 F (36.6 C), temperature source Oral, resp. rate 18, height 6' (1.829 m), weight 277 lb (125.646 kg), SpO2 95 %.  Intake/Output Summary (Last 24 hours) at 04/28/15 0950 Last data filed at 04/28/15 0618  Gross per 24 hour  Intake    480 ml  Output      0 ml  Net    480 ml    Telemetry: Sinus rhythm with intraventricular conduction delay.   GEN:  Obese 64 yo in NAD   HEENT: Conjunctiva and lids normal, oropharynx clear with moist mucosa. Neck: Supple, no elevated JVP or carotid bruits, no thyromegaly. Lungs: Clear to auscultation, nonlabored breathing at rest. Cardiac: Regular rate and rhythm, no S3 or significant systolic murmur, no pericardial rub. Abdomen: Soft, nontender, no hepatomegaly, bowel sounds present, no guarding or rebound. Extremities: No pitting edema, distal pulses 2+. Skin: Warm and dry. Musculoskeletal: No kyphosis. Neuropsychiatric: Alert  and oriented x3, affect grossly appropriate.  Prior Cardiac Testing/Procedures None Lab Results  Basic Metabolic Panel:  Recent Labs Lab 04/27/15 1336 04/28/15 0420 04/28/15 0611  NA 139 141  --   K 2.9* 2.7*  --   CL 97* 99*  --   CO2 32 33*  --   GLUCOSE 111* 115*  --   BUN 12 10  --   CREATININE 0.89 0.79  --   CALCIUM 9.4 9.0  --   MG 2.1  --  2.0    Liver Function Tests:  Recent Labs Lab 04/27/15 1336 04/28/15 0420  AST 34 26  ALT 21 18  ALKPHOS 84 71  BILITOT 1.2 1.5*  PROT 8.3* 7.3  ALBUMIN  4.0 3.5    CBC:  Recent Labs Lab 04/27/15 1336 04/28/15 0420  WBC 10.4 9.6  NEUTROABS 6.0  --   HGB 17.9* 16.6  HCT 51.0 48.4  MCV 83.2 83.7  PLT 302 279    Cardiac Enzymes:  Recent Labs Lab 04/27/15 1336 04/27/15 1638 04/27/15 2224 04/28/15 0420  TROPONINI 0.04* 0.04* 0.04* 0.03    Radiology: Dg Chest 2 View  04/27/2015  CLINICAL DATA:  Dizziness since Sunday.  Smoker, cough. EXAM: CHEST  2 VIEW COMPARISON:  None. FINDINGS: The heart size and mediastinal contours are within normal limits. Both lungs are clear. The visualized skeletal structures are unremarkable. IMPRESSION: No active cardiopulmonary disease. Electronically Signed   By: Rolm Baptise M.D.   On: 04/27/2015 13:57     ECG: SR, lst degree AV block (PR 240)  RBBB.  LAFB  Prolonged QTc) Nonspecific ST changes    Impression and Recommendations  1  Atrial fibrillation  New  Unknown duration.  Most likely related to fatigue, SOB  Currently back in SR. Echo with normal chamber size, normal LVEF /RVEF  Afib may be related to thyroid dysfunction  Would replete Pt should be on anticoagulation until thyroid function is stabilized and persistent SR confirmed   1. Dizziness: Improved now that back in SR and after IVfluids  Would d/c fluids and follow  Note neuro consulted   2. RBBB/LAFB with 1st degree block:Follow closely  Prob wont tolerate much with neg chronotropes that could further disrupt conduction   3. Hypokalemia: Being repleted  Check in am  4.  Thyroid  Free t3 and t4 pending   5. Obesity with snoring: ]Needs eval for sleep apnea   Signed: Phill Myron. Lawrence NP AACC  04/28/2015, 9:50 AM Co-Sign MD  Pt seen and examined  I have amended note above by Arnold Long to reflect my findings  Will continue to follow   Dorris Carnes

## 2015-04-28 NOTE — Progress Notes (Signed)
CRITICAL VALUE ALERT  Critical value received:  yes  Date of notification:  04/28/15  Time of notification:  05285  Critical value read back:no  Nurse who received alert:  Kendra Opitz RN  MD notified (1st page):  Dr Arnoldo Morale  Time of first page:  0525  MD notified (2nd page):n/a  Time of second page:n/a  Responding MD:  Dr Arnoldo Morale  Time MD responded:  [phone]

## 2015-04-28 NOTE — Progress Notes (Signed)
Critical EKG Result - STEMI - Reported to Dr Arnoldo Morale.  Stat Troponin ordered.  If elevated new orders may be given.

## 2015-04-28 NOTE — Care Management Note (Signed)
Case Management Note  Patient Details  Name: Roy [name] MRN: [id number] Date of Birth: [birth year]  Subjective/Objective:                  Pt admitted from home with a fib/hypokalemia. Pt lives with his wife and will return home at discharge. Pt is independent with ADL's. Pt has no Part D insurance.  Action/Plan: CM explained to pt the importance of having Part D insurance for medication coverage. No medication assistance can be given at discharge.   Expected Discharge Date:                  Expected Discharge Plan:  Home/Self Care  In-House Referral:  NA  Discharge planning Services  CM Consult  Post Acute Care Choice:  NA Choice offered to:  NA  DME Arranged:    DME Agency:     HH Arranged:    HH Agency:     Status of Service:  Completed, signed off  Medicare Important Message Given:    Date Medicare IM Given:    Medicare IM give by:    Date Additional Medicare IM Given:    Additional Medicare Important Message give by:     If discussed at [place] of Stay Meetings, dates discussed:    Additional Comments:  Joylene Draft, RN 04/28/2015, 1:31 PM

## 2015-04-28 NOTE — Progress Notes (Signed)
[facility] PROGRESS NOTE   Roy [name] [id number] DOB: 1950-08-28 DOA: 04/27/2015 PCP: Sherrie Mustache, MD  HPI/Subjective: Seen with wife at bedside, denies any dizziness and chest today. No chest pain, denies any palpitations.  Assessment/Plan: Principal Problem:   Dizziness Active Problems:   Atrial fibrillation (HCC)   Hypokalemia   RBBB   First degree AV block   Dizziness Presented with dizziness since Sunday, this is comes and goes. MRI of the brain without new findings. EKG showed different findings, initially showed atrial fibrillation but atrial fibrillation now is gone. Cardiology to evaluate.  Atrial fibrillation EKG showed atrial fibrillation which is resolved now (cannot rule out paroxysmal nature of it) All 3 EKGs showed right bundle branch block and left anterior fascicular block. Now has first-degree AV block, RBBB and LAFB, cardiology to evaluate, cannot rule out trifascicular block.  Hypothyroidism Possible hypothyroidism with elevated TSH. Check a free T4 and total T3, likely will start Synthroid at 1 mcg/kg/day.  Hypokalemia Unclear etiology, will replete with oral supplements.  Probable OSA Patient has body habitus and symptoms (including snoring at night and daytime sleepiness) suggesting OSA. Recommend polysomnogram as outpatient.   Code Status: Full Code Family Communication: Plan discussed with the patient. Disposition Plan: Remains inpatient Diet: Diet Heart Room service appropriate?: Yes; Fluid consistency:: Thin  Consultants:  Cardiology.  Neurology  Procedures:  None  Antibiotics:  None   Objective: Filed Vitals:   04/27/15 2035 04/28/15 0617  BP: 160/90 147/88  Pulse: 65 83  Temp: 97.8 F (36.6 C) 97.8 F (36.6 C)  Resp: 18 18    Intake/Output Summary (Last 24 hours) at 04/28/15 1145 Last data filed at 04/28/15 1021  Gross per 24 hour  Intake    840 ml  Output      0 ml  Net    840 ml     Filed Weights   04/27/15 1230  Weight: 125.646 kg (277 lb)    Exam: General: Alert and awake, oriented x3, not in any acute distress. HEENT: anicteric sclera, pupils reactive to light and accommodation, EOMI CVS: S1-S2 clear, no murmur rubs or gallops Chest: clear to auscultation bilaterally, no wheezing, rales or rhonchi Abdomen: soft nontender, nondistended, normal bowel sounds, no organomegaly Extremities: no cyanosis, clubbing or edema noted bilaterally Neuro: Cranial nerves II-XII intact, no focal neurological deficits  Data Reviewed: Basic Metabolic Panel:  Recent Labs Lab 04/27/15 1336 04/28/15 0420 04/28/15 0611  NA 139 141  --   K 2.9* 2.7*  --   CL 97* 99*  --   CO2 32 33*  --   GLUCOSE 111* 115*  --   BUN 12 10  --   CREATININE 0.89 0.79  --   CALCIUM 9.4 9.0  --   MG 2.1  --  2.0   Liver Function Tests:  Recent Labs Lab 04/27/15 1336 04/28/15 0420  AST 34 26  ALT 21 18  ALKPHOS 84 71  BILITOT 1.2 1.5*  PROT 8.3* 7.3  ALBUMIN 4.0 3.5   No results for input(s): LIPASE, AMYLASE in the last 168 hours. No results for input(s): AMMONIA in the last 168 hours. CBC:  Recent Labs Lab 04/27/15 1336 04/28/15 0420  WBC 10.4 9.6  NEUTROABS 6.0  --   HGB 17.9* 16.6  HCT 51.0 48.4  MCV 83.2 83.7  PLT 302 279   Cardiac Enzymes:  Recent Labs Lab 04/27/15 1336 04/27/15 1638 04/27/15 2224 04/28/15 0420 04/28/15 0611  TROPONINI 0.04* 0.04* 0.04*  0.03 0.03   BNP (last 3 results) No results for input(s): BNP in the last 8760 hours.  ProBNP (last 3 results) No results for input(s): PROBNP in the last 8760 hours.  CBG: No results for input(s): GLUCAP in the last 168 hours.  Micro No results found for this or any previous visit (from the past 240 hour(s)).   Studies: Dg Chest 2 View  04/27/2015  CLINICAL DATA:  Dizziness since Sunday.  Smoker, cough. EXAM: CHEST  2 VIEW COMPARISON:  None. FINDINGS: The heart size and mediastinal contours are  within normal limits. Both lungs are clear. The visualized skeletal structures are unremarkable. IMPRESSION: No active cardiopulmonary disease. Electronically Signed   By: Rolm Baptise M.D.   On: 04/27/2015 13:57    Scheduled Meds: . enoxaparin (LOVENOX) injection  40 mg Subcutaneous Q24H  . pantoprazole  40 mg Oral Daily  . potassium chloride  60 mEq Oral Q6H  . sodium chloride  3 mL Intravenous Q12H   Continuous Infusions: . 0.9 % NaCl with KCl 40 mEq / L 75 mL/hr (04/28/15 0736)       Time spent: 35 minutes    [facility] A  [facility] Pager [phone] If 7PM-7AM, please contact night-coverage at [email or web address], password [facility] 04/28/2015, 11:45 AM  LOS: 1 day

## 2015-04-28 NOTE — Progress Notes (Addendum)
CRITICAL VALUE ALERT  Critical value received:  K+ = 2.7  Date of notification:  04/28/15  Time of notification:  0650  Critical value read back:yes  Nurse who received alert:  Kendra Opitz RN  MD notified (1st page):  Dr Arnoldo Morale  Time of first page:  [phone]  MD notified (2nd page):N/A  Time of second page:N/A  Responding MD:  N/A  Time MD responded:  N/A

## 2015-04-29 DIAGNOSIS — I444 Left anterior fascicular block: Secondary | ICD-10-CM | POA: Diagnosis present

## 2015-04-29 DIAGNOSIS — I453 Trifascicular block: Secondary | ICD-10-CM | POA: Diagnosis present

## 2015-04-29 DIAGNOSIS — I480 Paroxysmal atrial fibrillation: Secondary | ICD-10-CM

## 2015-04-29 DIAGNOSIS — I10 Essential (primary) hypertension: Secondary | ICD-10-CM | POA: Diagnosis present

## 2015-04-29 LAB — BASIC METABOLIC PANEL
Anion gap: 11 (ref 5–15)
BUN: 10 mg/dL (ref 6–20)
CHLORIDE: 99 mmol/L — AB (ref 101–111)
CO2: 30 mmol/L (ref 22–32)
CREATININE: 0.83 mg/dL (ref 0.61–1.24)
Calcium: 9.4 mg/dL (ref 8.9–10.3)
GFR calc Af Amer: >60 mL/min (ref >60)
GFR calc non Af Amer: >60 mL/min (ref >60)
GLUCOSE: 85 mg/dL (ref 65–99)
Potassium: 3 mmol/L — ABNORMAL LOW (ref 3.5–5.1)
SODIUM: 140 mmol/L (ref 135–145)

## 2015-04-29 LAB — LIPID PANEL
CHOL/HDL RATIO: 7.7 ratio
CHOLESTEROL: 147 mg/dL (ref 0–200)
HDL: 19 mg/dL — ABNORMAL LOW (ref >40)
LDL Cholesterol: 97 mg/dL (ref 0–99)
Triglycerides: 153 mg/dL — ABNORMAL HIGH (ref <150)
VLDL: 31 mg/dL (ref 0–40)

## 2015-04-29 LAB — T3: T3 TOTAL: 149 ng/dL (ref 71–180)

## 2015-04-29 MED ORDER — REGADENOSON 0.4 MG/5ML IV SOLN
0.4000 mg | Freq: Once | INTRAVENOUS | Status: DC
Start: 2015-05-03 — End: 2015-04-29

## 2015-04-29 MED ORDER — POTASSIUM CHLORIDE CRYS ER 20 MEQ PO TBCR
60.0000 meq | EXTENDED_RELEASE_TABLET | Freq: Once | ORAL | Status: AC
  Administered 2015-04-29: 60 meq via ORAL
  Filled 2015-04-29: qty 3

## 2015-04-29 MED ORDER — ASPIRIN EC 81 MG PO TBEC: 81.0000 mg | DELAYED_RELEASE_TABLET | Freq: Every day | ORAL | Status: DC

## 2015-04-29 MED ORDER — RIVAROXABAN 20 MG PO TABS
20.0000 mg | ORAL_TABLET | Freq: Every day | ORAL | Status: DC
Start: 2015-04-29 — End: 2018-03-16

## 2015-04-29 NOTE — Discharge Summary (Addendum)
Physician Discharge Summary  Roy [name] [id number] DOB: [birth year] DOA: 04/27/2015  PCP: Sherrie Mustache, MD  Admit date: 04/27/2015 Discharge date: 04/29/2015  Time spent: 40  minutes  Recommendations for Outpatient Follow-up:  1. Follow-up with cardiology on 05/04/2015. 2. Discharged on Xarelto per cardiology recommendation.   Discharge Diagnoses:  Principal Problem:   Dizziness Active Problems:   Atrial fibrillation (HCC)   Hypokalemia   RBBB   First degree AV block   Essential hypertension   Trifascicular block   LAFB (left anterior fascicular block)   Discharge Condition: Stable  Diet recommendation: Heart healthy  Filed Weights   04/27/15 1230  Weight: 125.646 kg (277 lb)    History of present illness:  Roy [name] is a 64 y.o. male  This is a 64 year old man who gives a 4-5 day history of dizziness. He feels unsteady on his feet and thinks he is going to fall but has not done so. He has been using a cane to avoid falls. She denies any lightheadedness as if he was going to faint. He denies any chest pain or palpitations. There is no dyspnea. He does not have any double vision or slurred speech. There is no limb weakness. Evaluation in the emergency room shows him to have new onset atrial fibrillation which is rate controlled. He is now being admitted for further management.   Hospital Course:   Dizziness Presented with dizziness since Sunday, this is comes and goes. MRI of the brain without new findings. EKG showed different findings, initially showed atrial fibrillation which spontaneously changed to normal sinus rhythm No dizziness, patient to follow-up with cardiology at their office. I have asked him to avoid driving and operating motorized machinery until he sees his cardiologist as outpatient.  Atrial fibrillation EKG showed atrial fibrillation which is resolved now (cannot rule out paroxysmal nature of it) Patient follow-up with  cardiology on 05/04/2015. Discharge on Xarelto 20 mg for CHA2DS2-VASc score of 1 or 2 per cardiology recommendation This patients CHA2DS2-VASc Score and unadjusted Ischemic Stroke Rate (% per year) is equal to 2.2 % stroke rate/year from a score of 2 for age of 51 and probable HTN. Above score calculated as 1 point each if present [CHF, HTN, DM, Vascular=MI/PAD/Aortic Plaque, Age if 65-74, or Male] Above score calculated as 2 points each if present [Age > 75, or Stroke/TIA/TE].  Trifascicular block All 3 EKGs showed right bundle branch block and left anterior fascicular block. Now has first-degree AV block, RBBB and LAFB, patient has trifascicular block, to follow-up with cardiology as outpatient. Patient has multiple missed beats in the telemetry. Discussed with general and EP cardiology Dr. Curt Bears (who recommended outpatient monitoring), okay with discharge home.  Hypothyroidism TSH is elevated at 6.505, previous TSH was 10.503 day ago (? Lab error) Free T4 and total T3 normal, recheck TSH, free T4 and total T3 in 3-4 weeks. The thyroid function test picture might indicate subclinical hypothyroidism, this is comes with bradycardia and NOT A. fib and tachyarrhythmia. Did not start Synthroid recheck TFTs in 3-4 weeks.  Hypokalemia Unclear etiology, repleted with oral supplements, recheck at the next appointment.  Probable OSA Patient has body habitus and symptoms (including snoring at night and daytime sleepiness) suggesting OSA. Highly recommend polysomnogram as outpatient.   Procedures:  2-D echo done on 04/28/2015 Study Conclusions - Left ventricle: The cavity size was normal. Wall thickness was increased in a pattern of mild LVH. Systolic function was normal. The estimated ejection fraction was in  the range of 55% to 60%. - Aortic valve: There was mild regurgitation. - Pulmonary arteries: PA peak pressure: 33 mm Hg (S).  Consultations:  None  Discharge Exam: Filed  Vitals:   04/29/15 0529 04/29/15 1446  BP: 163/91 160/87  Pulse: 70 75  Temp: 97.5 F (36.4 C) 97.9 F (36.6 C)  Resp: 18 20   General: Alert and awake, oriented x3, not in any acute distress. Obese HEENT: anicteric sclera, pupils reactive to light and accommodation, EOMI CVS: S1-S2 clear, no murmur rubs or gallops Chest: clear to auscultation bilaterally, no wheezing, rales or rhonchi Abdomen: soft nontender, nondistended, normal bowel sounds, no organomegaly Extremities: no cyanosis, clubbing or edema noted bilaterally Neuro: Cranial nerves II-XII intact, no focal neurological deficits  Discharge Instructions   Discharge Instructions    Diet - low sodium heart healthy    Complete by:  As directed      Increase activity slowly    Complete by:  As directed           Current Discharge Medication List    START taking these medications   Details  rivaroxaban (XARELTO) 20 MG TABS tablet Take 1 tablet (20 mg total) by mouth daily with supper. Qty: 30 tablet, Refills: 0      CONTINUE these medications which have NOT CHANGED   Details  omeprazole (PRILOSEC OTC) 20 MG tablet Take 20 mg by mouth daily.       Allergies  Allergen Reactions  . Adhesive [Tape]   . Sulfa Antibiotics     UNKNOWN Childhood reaction   Follow-up Information    Follow up with Erlene Quan, PA-C On 05/04/2015.   Specialties:  Cardiology, Radiology   Why:  3 pm.   Contact information:   [address] [place] [address] [phone]       Follow up with [facility].   Why:  Stress Test-Arrive at 11:30 am. Nothing to eat after midnight but take all medications with a sip of water.        The results of significant diagnostics from this hospitalization (including imaging, microbiology, ancillary and laboratory) are listed below for reference.    Significant Diagnostic Studies: Dg Chest 2 View  04/27/2015  CLINICAL DATA:  Dizziness since Sunday.  Smoker, cough. EXAM:  CHEST  2 VIEW COMPARISON:  None. FINDINGS: The heart size and mediastinal contours are within normal limits. Both lungs are clear. The visualized skeletal structures are unremarkable. IMPRESSION: No active cardiopulmonary disease. Electronically Signed   By: Rolm Baptise M.D.   On: 04/27/2015 13:57    Microbiology: No results found for this or any previous visit (from the past 240 hour(s)).   Labs: Basic Metabolic Panel:  Recent Labs Lab 04/27/15 1336 04/28/15 0420 04/28/15 0611 04/29/15 0426  NA 139 141  --  140  K 2.9* 2.7*  --  3.0*  CL 97* 99*  --  99*  CO2 32 33*  --  30  GLUCOSE 111* 115*  --  85  BUN 12 10  --  10  CREATININE 0.89 0.79  --  0.83  CALCIUM 9.4 9.0  --  9.4  MG 2.1  --  2.0  --    Liver Function Tests:  Recent Labs Lab 04/27/15 1336 04/28/15 0420  AST 34 26  ALT 21 18  ALKPHOS 84 71  BILITOT 1.2 1.5*  PROT 8.3* 7.3  ALBUMIN 4.0 3.5   No results for input(s): LIPASE, AMYLASE in the  last 168 hours. No results for input(s): AMMONIA in the last 168 hours. CBC:  Recent Labs Lab 04/27/15 1336 04/28/15 0420  WBC 10.4 9.6  NEUTROABS 6.0  --   HGB 17.9* 16.6  HCT 51.0 48.4  MCV 83.2 83.7  PLT 302 279   Cardiac Enzymes:  Recent Labs Lab 04/27/15 1336 04/27/15 1638 04/27/15 2224 04/28/15 0420 04/28/15 0611  TROPONINI 0.04* 0.04* 0.04* 0.03 0.03   BNP: BNP (last 3 results) No results for input(s): BNP in the last 8760 hours.  ProBNP (last 3 results) No results for input(s): PROBNP in the last 8760 hours.  CBG: No results for input(s): GLUCAP in the last 168 hours.     Signed:  Birdie Hopes MD    [facility] 04/29/2015, 3:11 PM

## 2015-04-29 NOTE — Discharge Instructions (Addendum)

## 2015-04-29 NOTE — Care Management Important Message (Signed)
Important Message  Patient Details  Name: Roy [name] MRN: [id number] Date of Birth: October 19, 1950   Medicare Important Message Given:  Yes    Joylene Draft, RN 04/29/2015, 1:50 PM

## 2015-04-29 NOTE — Care Management Note (Signed)
Case Management Note  Patient Details  Name: TORRY GRADY MRN: [id number] Date of Birth: 02/06/1951  Subjective/Objective:                    Action/Plan:   Expected Discharge Date:                  Expected Discharge Plan:  Home/Self Care  In-House Referral:  NA  Discharge planning Services  CM Consult  Post Acute Care Choice:  NA Choice offered to:  NA  DME Arranged:    DME Agency:     HH Arranged:    HH Agency:     Status of Service:  Completed, signed off  Medicare Important Message Given:  Yes Date Medicare IM Given:    Medicare IM give by:    Date Additional Medicare IM Given:    Additional Medicare Important Message give by:     If discussed at [place] of Stay Meetings, dates discussed:    Additional Comments: Pt discharged home today. No CM needs noted. Christinia Gully Lennox, RN 04/29/2015, 2:29 PM

## 2015-04-29 NOTE — Progress Notes (Signed)
ANTICOAGULATION CONSULT NOTE - Initial Consult  Pharmacy Consult for Xarelto Indication: atrial fibrillation  Allergies  Allergen Reactions  . Adhesive [Tape]   . Sulfa Antibiotics     UNKNOWN Childhood reaction   Patient Measurements: Height: 6' (182.9 cm) Weight: 277 lb (125.646 kg) IBW/kg (Calculated) : 77.6  Vital Signs: Temp: 97.5 F (36.4 C) (12/23 0529) Temp Source: Oral (12/23 0529) BP: 163/91 mmHg (12/23 0529) Pulse Rate: 70 (12/23 0529)  Labs:  Recent Labs  04/27/15 1336  04/27/15 2224 04/28/15 0420 04/28/15 0611 04/29/15 0426  HGB 17.9*  --   --  16.6  --   --   HCT 51.0  --   --  48.4  --   --   PLT 302  --   --  279  --   --   CREATININE 0.89  --   --  0.79  --  0.83  TROPONINI 0.04*  < > 0.04* 0.03 0.03  --   < > = values in this interval not displayed.  Estimated Creatinine Clearance: 121.5 mL/min (by C-G formula based on Cr of 0.83).  Medical History: Past Medical History  Diagnosis Date  . GERD (gastroesophageal reflux disease)   . Obesity    Medications:  Prescriptions prior to admission  Medication Sig Dispense Refill Last Dose  . omeprazole (PRILOSEC OTC) 20 MG tablet Take 20 mg by mouth daily.   04/27/2015 at Unknown time   Assessment: Atrial fibrillation EKG showed atrial fibrillation which is resolved now (cannot rule out paroxysmal nature of it)   All 3 EKGs showed right bundle branch block and left anterior fascicular block.   Now has first-degree AV block, RBBB and LAFB, cardiology to evaluate, cannot rule out trifascicular block.  Goal of Therapy:  Anticoagulation for stroke prevention Monitor platelets by anticoagulation protocol: Yes   Plan: Asked to discuss treatment with patient and possible affordability of Xarelto.  Also d/w case Freight forwarder.  Pt will receive a benefits card for the 1st month of Xarelto for free and will be given information to apply for patient assistance with this medication. Xarelto education provided for  patient.    [place] Crane, Stephaie Dardis A 04/29/2015,10:54 AM

## 2015-04-29 NOTE — Progress Notes (Addendum)
Consulting cardiologist: Roy [name] Primary Cardiologist: Roy Carnes MD  Cardiology Specific Problem List: 1.Atrial fibrillation 2. RBBB-LAFB 3. Hypokalemia   Subjective:    Anxious to go home. No complaints  Breathing good  No CP    Objective:   Temp:  [97.5 F (36.4 C)-98.2 F (36.8 C)] 97.5 F (36.4 C) (12/23 0529) Pulse Rate:  [70-77] 70 (12/23 0529) Resp:  [18-20] 18 (12/23 0529) BP: (138-167)/(74-91) 163/91 mmHg (12/23 0529) SpO2:  [94 %-98 %] 96 % (12/23 0529) Last BM Date: 04/27/15  Filed Weights   04/27/15 1230  Weight: 277 lb (125.646 kg)    Intake/Output Summary (Last 24 hours) at 04/29/15 1045 Last data filed at 04/29/15 0954  Gross per 24 hour  Intake   1973 ml  Output      0 ml  Net   1973 ml    Telemetry: NSR in the 60's and 70's.   Exam:  General: No acute distress.  Feels good   HEENT: Conjunctiva and lids normal, oropharynx clear.  Lungs: Clear to auscultation, nonlabored.  Cardiac: No elevated JVP or bruits. RRR, no gallop or rub.   Abdomen: Normoactive bowel sounds, nontender, nondistended.  Extremities: No pitting edema, distal pulses full.  Neuropsychiatric: Alert and oriented x3, affect appropriate.   Lab Results:  Basic Metabolic Panel:  Recent Labs Lab 04/27/15 1336 04/28/15 0420 04/28/15 0611 04/29/15 0426  NA 139 141  --  140  K 2.9* 2.7*  --  3.0*  CL 97* 99*  --  99*  CO2 32 33*  --  30  GLUCOSE 111* 115*  --  85  BUN 12 10  --  10  CREATININE 0.89 0.79  --  0.83  CALCIUM 9.4 9.0  --  9.4  MG 2.1  --  2.0  --     Liver Function Tests:  Recent Labs Lab 04/27/15 1336 04/28/15 0420  AST 34 26  ALT 21 18  ALKPHOS 84 71  BILITOT 1.2 1.5*  PROT 8.3* 7.3  ALBUMIN 4.0 3.5    CBC:  Recent Labs Lab 04/27/15 1336 04/28/15 0420  WBC 10.4 9.6  HGB 17.9* 16.6  HCT 51.0 48.4  MCV 83.2 83.7  PLT 302 279    Cardiac Enzymes:  Recent Labs Lab 04/27/15 2224 04/28/15 0420 04/28/15 0611    TROPONINI 0.04* 0.03 0.03   Echocardiogram 04/28/2015 Left ventricle: The cavity size was normal. Wall thickness was increased in a pattern of mild LVH. Systolic function was normal. The estimated ejection fraction was in the range of 55% to 60%. - Aortic valve: There was mild regurgitation. - Pulmonary arteries: PA peak pressure: 33 mm Hg (S).  Radiology: Dg Chest 2 View  04/27/2015  CLINICAL DATA:  Dizziness since Sunday.  Smoker, cough. EXAM: CHEST  2 VIEW COMPARISON:  None. FINDINGS: The heart size and mediastinal contours are within normal limits. Both lungs are clear. The visualized skeletal structures are unremarkable. IMPRESSION: No active cardiopulmonary disease. Electronically Signed   By: Rolm Baptise M.D.   On: 04/27/2015 13:57     Medications:   Scheduled Medications: . enoxaparin (LOVENOX) injection  40 mg Subcutaneous Q24H  . pantoprazole  40 mg Oral Daily  . [START ON 05/03/2015] regadenoson  0.4 mg Intravenous Once  . sodium chloride  3 mL Intravenous Q12H    Infusions:    PRN Medications: ondansetron **OR** ondansetron (ZOFRAN) IV   Assessment and Plan:   1. Atrial fib: Now back in  SR. He is not on any BB at this time due to conduction delays  He is feeling much better.  OK to D/C with close f/u Appt on Wed in clinic   On Xarelto for now  CHADSVASc 1or 2  (2 if BP remains high)  F/U next week  Again, close f/u of thyroid    2. Hypothyroidism: T4 and T3 are normal. TSH 6.505 two days ago.  Also TSH of 10  Will need to to be followed   3. Hypokalemia: Potassium improved but not at normal level. Question due to hypothyroid involvement. Will give another dose of potassium 60 mEq today. He wants to go home. Will need daily potassium replacement and follow up labs as OP. Magnesium 2.0.   Roy Myron. Lawrence NP AACC  04/29/2015, 10:45 AM   Patient seen and examined  I have amended note above to reflect my findings.   Pt feeling much better  Currently  maintaining SR TSH elevated but free t3, free t4 OK  THis will need to be followed   Echo yesterday showed normal LVEF / RVEF/ atrial size On Xarelto now.  CHADSVasc 1 or 2 (if hypertensive )  WIll need to be reasessed in clinic  Thyroid function tracked BP followed  BP was up this AM    Roy [name]

## 2015-04-29 NOTE — Progress Notes (Signed)
Discharge instructions and prescription given, verbalized understanding, out in stable condition ambulatory with staff. 

## 2015-05-03 ENCOUNTER — Inpatient Hospital Stay (HOSPITAL_COMMUNITY): Admit: 2015-05-03 | Payer: Medicare Other

## 2015-05-04 ENCOUNTER — Encounter: Payer: Self-pay | Admitting: Cardiology

## 2015-05-04 ENCOUNTER — Ambulatory Visit (INDEPENDENT_AMBULATORY_CARE_PROVIDER_SITE_OTHER): Payer: Medicare Other | Admitting: Cardiology

## 2015-05-04 ENCOUNTER — Other Ambulatory Visit (HOSPITAL_COMMUNITY)
Admission: RE | Admit: 2015-05-04 | Discharge: 2015-05-04 | Disposition: A | Discharge status: Home / Self Care | Payer: Medicare Other | Source: Ambulatory Visit | Attending: Cardiology | Admitting: Cardiology

## 2015-05-04 VITALS — BP 150/92 | HR 65 | Ht 72.0 in | Wt 282.0 lb

## 2015-05-04 DIAGNOSIS — Z7901 Long term (current) use of anticoagulants: Secondary | ICD-10-CM

## 2015-05-04 DIAGNOSIS — I4891 Unspecified atrial fibrillation: Secondary | ICD-10-CM | POA: Diagnosis not present

## 2015-05-04 DIAGNOSIS — I4510 Unspecified right bundle-branch block: Secondary | ICD-10-CM | POA: Diagnosis not present

## 2015-05-04 DIAGNOSIS — I440 Atrioventricular block, first degree: Secondary | ICD-10-CM

## 2015-05-04 DIAGNOSIS — Z5181 Encounter for therapeutic drug level monitoring: Secondary | ICD-10-CM | POA: Diagnosis not present

## 2015-05-04 DIAGNOSIS — E876 Hypokalemia: Secondary | ICD-10-CM

## 2015-05-04 DIAGNOSIS — Z79899 Other long term (current) drug therapy: Secondary | ICD-10-CM

## 2015-05-04 DIAGNOSIS — E669 Obesity, unspecified: Secondary | ICD-10-CM

## 2015-05-04 DIAGNOSIS — I480 Paroxysmal atrial fibrillation: Secondary | ICD-10-CM

## 2015-05-04 DIAGNOSIS — I444 Left anterior fascicular block: Secondary | ICD-10-CM

## 2015-05-04 LAB — BASIC METABOLIC PANEL
Anion gap: 10 (ref 5–15)
BUN: 9 mg/dL (ref 6–20)
CO2: 31 mmol/L (ref 22–32)
Calcium: 9.9 mg/dL (ref 8.9–10.3)
Chloride: 99 mmol/L — ABNORMAL LOW (ref 101–111)
Creatinine, Ser: 0.7 mg/dL (ref 0.61–1.24)
GFR calc Af Amer: >60 mL/min (ref >60)
GFR calc non Af Amer: >60 mL/min (ref >60)
Glucose, Bld: 104 mg/dL — ABNORMAL HIGH (ref 65–99)
Potassium: 4.3 mmol/L (ref 3.5–5.1)
Sodium: 140 mmol/L (ref 135–145)

## 2015-05-04 NOTE — Assessment & Plan Note (Signed)
BMI 38, possible sleep apnea

## 2015-05-04 NOTE — Patient Instructions (Signed)
Your physician recommends that you schedule a follow-up appointment with Dr. Harrington Challenger after your sleep study   Your physician recommends that you continue on your current medications as directed. Please refer to the Current Medication list given to you today.  Your physician has recommended that you have a sleep study. This test records several body functions during sleep, including: brain activity, eye movement, oxygen and carbon dioxide blood levels, heart rate and rhythm, breathing rate and rhythm, the flow of air through your mouth and nose, snoring, body muscle movements, and chest and belly movement.  If you need a refill on your cardiac medications before your next appointment, please call your pharmacy.  Thank you for choosing [facility]!

## 2015-05-04 NOTE — Assessment & Plan Note (Signed)
Pt converted spontaneously to NSR. No obvious cause.

## 2015-05-04 NOTE — Assessment & Plan Note (Signed)
K+ was 3.0 in ED when he presented with PAF

## 2015-05-04 NOTE — Assessment & Plan Note (Signed)
Xarelto

## 2015-05-04 NOTE — Progress Notes (Signed)
    05/04/2015 Roy [name]   [birth year]  [id number]  Primary Physician Sherrie Mustache, MD Primary Cardiologist: Dr Harrington Challenger  HPI:  64 y/o obese male, no prior history of arrhythmia or CAD, presented to the ED 04/29/15 with dizziness and was found to be in AF and had a K+ of 3. His K+ was repleted and he converted spontaneously to NSR. His baseline EKG shows NSR with trifascicular block so he was not started on a beta blocker. His initial TSH was high-10, but repeat was 6 with normal free T4. He is in the office today for follow up. He is on Xarelto. He has not had recurrent PAF.    Current Outpatient Prescriptions  Medication Sig Dispense Refill  . omeprazole (PRILOSEC OTC) 20 MG tablet Take 20 mg by mouth daily.    . rivaroxaban (XARELTO) 20 MG TABS tablet Take 1 tablet (20 mg total) by mouth daily with supper. 30 tablet 0   No current facility-administered medications for this visit.    Allergies  Allergen Reactions  . Adhesive [Tape]   . Sulfa Antibiotics     UNKNOWN Childhood reaction    Social History   Social History  . Marital Status: Single    Spouse Name: N/A  . Number of Children: N/A  . Years of Education: N/A   Occupational History  . Not on file.   Social History Main Topics  . Smoking status: Never Smoker   . Smokeless tobacco: Not on file  . Alcohol Use: No  . Drug Use: No  . Sexual Activity: Not on file   Other Topics Concern  . Not on file   Social History Narrative     Review of Systems: General: negative for chills, fever, night sweats or weight changes.  Cardiovascular: negative for chest pain, dyspnea on exertion, edema, orthopnea, palpitations, paroxysmal nocturnal dyspnea or shortness of breath Dermatological: negative for rash Respiratory: negative for cough or wheezing Urologic: negative for hematuria Abdominal: negative for nausea, vomiting, diarrhea, bright red blood per rectum, melena, or hematemesis Neurologic: negative for  visual changes, syncope, or dizziness All other systems reviewed and are otherwise negative except as noted above.    Blood pressure 150/92, pulse 65, height 6' (1.829 m), weight 282 lb (127.914 kg), SpO2 97 %.  General appearance: alert, cooperative, no distress and moderately obese Neck: no carotid bruit, no JVD and thyroid not enlarged, symmetric, no tenderness/mass/nodules Lungs: clear to auscultation bilaterally Heart: regular rate and rhythm Extremities: extremities normal, atraumatic, no cyanosis or edema Pulses: 2+ and symmetric Neurologic: Grossly normal  EKG NSR, RBBB, 1st degree AVB, LAD  ASSESSMENT AND PLAN:   RBBB .  LAFB (left anterior fascicular block) .  First degree AV block .  PAF (paroxysmal atrial fibrillation) ([place]) Pt converted spontaneously to NSR. No obvious cause.  Obesity (BMI 30-39.9) BMI 38, possible sleep apnea  Hypokalemia K+ was 3.0 in ED when he presented with PAF  Chronic anticoagulation Xarelto    PLAN  Check K+. Order sleep study. Continue Xarelto, f/u Dr Harrington Challenger in 3 months.  Kerin Ransom K PA-C 05/04/2015 3:26 PM

## 2015-05-06 ENCOUNTER — Telehealth: Payer: Self-pay

## 2015-05-06 NOTE — Telephone Encounter (Signed)
Gave results to patient's wife.

## 2015-07-22 ENCOUNTER — Encounter: Payer: Medicare Other | Admitting: Internal Medicine

## 2015-07-22 NOTE — Progress Notes (Signed)
Error   This encounter was created in error - please disregard. 

## 2017-02-25 DIAGNOSIS — Z23 Encounter for immunization: Secondary | ICD-10-CM | POA: Diagnosis not present

## 2017-03-19 DIAGNOSIS — I10 Essential (primary) hypertension: Secondary | ICD-10-CM | POA: Diagnosis not present

## 2018-02-26 ENCOUNTER — Encounter (HOSPITAL_COMMUNITY): Payer: Self-pay | Admitting: Emergency Medicine

## 2018-02-26 ENCOUNTER — Other Ambulatory Visit: Payer: Self-pay

## 2018-02-26 ENCOUNTER — Emergency Department (HOSPITAL_COMMUNITY)
Admission: EM | Admit: 2018-02-26 | Discharge: 2018-02-26 | Disposition: A | Discharge status: Home / Self Care | Payer: Medicare Other | Attending: Emergency Medicine | Admitting: Emergency Medicine

## 2018-02-26 DIAGNOSIS — Z7901 Long term (current) use of anticoagulants: Secondary | ICD-10-CM | POA: Diagnosis not present

## 2018-02-26 DIAGNOSIS — Z79899 Other long term (current) drug therapy: Secondary | ICD-10-CM | POA: Insufficient documentation

## 2018-02-26 DIAGNOSIS — R030 Elevated blood-pressure reading, without diagnosis of hypertension: Secondary | ICD-10-CM

## 2018-02-26 DIAGNOSIS — R21 Rash and other nonspecific skin eruption: Secondary | ICD-10-CM | POA: Diagnosis not present

## 2018-02-26 DIAGNOSIS — I10 Essential (primary) hypertension: Secondary | ICD-10-CM | POA: Insufficient documentation

## 2018-02-26 MED ORDER — LORATADINE 10 MG PO TABS
10.0000 mg | ORAL_TABLET | Freq: Once | ORAL | Status: AC
  Administered 2018-02-26: 10 mg via ORAL
  Filled 2018-02-26: qty 1

## 2018-02-26 MED ORDER — PREDNISONE 10 MG PO TABS: ORAL_TABLET | ORAL | 0 refills | Status: DC

## 2018-02-26 MED ORDER — PREDNISONE 50 MG PO TABS
60.0000 mg | ORAL_TABLET | Freq: Once | ORAL | Status: AC
  Administered 2018-02-26: 60 mg via ORAL
  Filled 2018-02-26: qty 1

## 2018-02-26 MED ORDER — LORATADINE 10 MG PO TABS: 10.0000 mg | ORAL_TABLET | Freq: Every day | ORAL | 0 refills | Status: DC

## 2018-02-26 NOTE — ED Triage Notes (Signed)
Patient with rash to his lower legs and lower arms. Onset over the weekend.

## 2018-02-26 NOTE — Discharge Instructions (Addendum)
Your rash does look like insect bites and favors chiggers as discussed. Continue taking the loratadine (claritin) daily which can help with itching.  You may also take benadryl as discussed but this will make you drowsy.  Take your next dose of prednisone tomorrow evening and taper the dose per the instructions until gone.  Get rechecked for any worsening or persistent symptoms.  Your blood pressure is elevated tonight and you should have this rechecked within 1 week.

## 2018-02-26 NOTE — ED Provider Notes (Signed)
[facility] EMERGENCY DEPARTMENT Provider Note   CSN: [id number] Arrival date & time: 02/26/18  [place]     History   Chief Complaint Chief Complaint  Patient presents with  . Rash    HPI Roy [name] is a 67 y.o. male with past medical history as outlined below presenting with an itchy rash mostly on his bilateral lower extremities but with a few areas on his left forearm which started 3-4 days ago. He reports symptoms started while working outdoors, mowing his grass in an area "loaded with chiggers" but also spread straw in his dog lot which may have also been a trigger for rash.  He has used rubbing alcohol, hydrogen peroxide and currently has calamine lotion on his legs which does help the itching.  He does not like taking benadryl due to drowsiness.  HPI  Past Medical History:  Diagnosis Date  . GERD (gastroesophageal reflux disease)   . Obesity     Patient Active Problem List   Diagnosis Date Noted  . Obesity (BMI 30-39.9) 05/04/2015  . Chronic anticoagulation 05/04/2015  . Trifascicular block 04/29/2015  . LAFB (left anterior fascicular block) 04/29/2015  . Essential hypertension   . Hypokalemia 04/28/2015  . RBBB 04/28/2015  . First degree AV block 04/28/2015  . PAF (paroxysmal atrial fibrillation) ([place]) 04/27/2015  . Dizziness 04/27/2015    Past Surgical History:  Procedure Laterality Date  . CHOLECYSTECTOMY    . JOINT REPLACEMENT          Home Medications    Prior to Admission medications   Medication Sig Start Date End Date Taking? Authorizing Provider  loratadine (CLARITIN) 10 MG tablet Take 1 tablet (10 mg total) by mouth daily. 02/26/18   Evalee Jefferson, PA-C  omeprazole (PRILOSEC OTC) 20 MG tablet Take 20 mg by mouth daily.    [provider]  predniSONE (DELTASONE) 10 MG tablet Take 6 tablets day one, 5 tablets day two, 4 tablets day three, 3 tablets day four, 2 tablets day five, then 1 tablet day six 02/26/18   Kelie Gainey, Almyra Free, PA-C    rivaroxaban (XARELTO) 20 MG TABS tablet Take 1 tablet (20 mg total) by mouth daily with supper. 04/29/15   Verlee Monte, MD    Family History Family History  Problem Relation Age of Onset  . Heart failure Father   . Heart attack Father        Deceased   . Thyroid disease Sister     Social History Social History   Tobacco Use  . Smoking status: Never Smoker  . Smokeless tobacco: Never Used  Substance Use Topics  . Alcohol use: No  . Drug use: No     Allergies   Adhesive [tape] and Sulfa antibiotics   Review of Systems Review of Systems  Constitutional: Negative for chills and fever.  Respiratory: Negative for shortness of breath and wheezing.   Skin: Positive for rash.  Neurological: Negative for numbness.     Physical Exam Updated Vital Signs BP (!) 156/80 (BP Location: Right Arm)   Pulse 64   Temp 97.9 F (36.6 C) (Oral)   Resp 17   Ht 6' (1.829 m)   Wt 113.4 kg   SpO2 99%   BMI 33.91 kg/m   Physical Exam  Constitutional: He appears well-developed and well-nourished. No distress.  HENT:  Head: Normocephalic.  Neck: Neck supple.  Cardiovascular: Normal rate.  Pulmonary/Chest: Effort normal. He has no wheezes.  Musculoskeletal: Normal range of motion. He exhibits  no edema.  Skin: Rash noted. Rash is papular.  Scattered, papular rash bilateral lower legs, most prominent along sock edges.  Blanching. Some with central scabbing. No pustules or vesicles.  No surrounding erythema or red streaking.  Left volar forearm with few smaller lesions.     ED Treatments / Results  Labs (all labs ordered are listed, but only abnormal results are displayed) Labs Reviewed - No data to display  EKG None  Radiology No results found.  Procedures Procedures (including critical care time)  Medications Ordered in ED Medications  loratadine (CLARITIN) tablet 10 mg (10 mg Oral Given 02/26/18 1930)  predniSONE (DELTASONE) tablet 60 mg (60 mg Oral Given 02/26/18  1930)     Initial Impression / Assessment and Plan / ED Course  I have reviewed the triage vital signs and the nursing notes.  Pertinent labs & imaging results that were available during my care of the patient were reviewed by me and considered in my medical decision making (see chart for details).     Probable chigger bites.  Placed on prednisone to help with inflammation, claritin in place of benadryl which pt does not like. Discussed elevated bp and need or recheck within 1 week.   Final Clinical Impressions(s) / ED Diagnoses   Final diagnoses:  Rash  Elevated blood pressure reading    ED Discharge Orders         Ordered    predniSONE (DELTASONE) 10 MG tablet     02/26/18 1948    loratadine (CLARITIN) 10 MG tablet  Daily     02/26/18 1948           Evalee Jefferson, PA-C 02/26/18 2345    Hayden Rasmussen, MD 02/27/18 1034

## 2018-03-11 DIAGNOSIS — Z1159 Encounter for screening for other viral diseases: Secondary | ICD-10-CM | POA: Diagnosis not present

## 2018-03-11 DIAGNOSIS — L03116 Cellulitis of left lower limb: Secondary | ICD-10-CM | POA: Diagnosis not present

## 2018-03-11 DIAGNOSIS — Z125 Encounter for screening for malignant neoplasm of prostate: Secondary | ICD-10-CM | POA: Diagnosis not present

## 2018-03-11 DIAGNOSIS — I10 Essential (primary) hypertension: Secondary | ICD-10-CM | POA: Diagnosis not present

## 2018-03-16 ENCOUNTER — Inpatient Hospital Stay (HOSPITAL_COMMUNITY)
Admission: EM | Admit: 2018-03-16 | Discharge: 2018-03-18 | DRG: 603 | Disposition: A | Discharge status: Home / Self Care | Payer: Medicare Other | Attending: Internal Medicine | Admitting: Internal Medicine

## 2018-03-16 ENCOUNTER — Other Ambulatory Visit: Payer: Self-pay

## 2018-03-16 ENCOUNTER — Encounter (HOSPITAL_COMMUNITY): Payer: Self-pay | Admitting: Emergency Medicine

## 2018-03-16 DIAGNOSIS — Z91048 Other nonmedicinal substance allergy status: Secondary | ICD-10-CM

## 2018-03-16 DIAGNOSIS — I10 Essential (primary) hypertension: Secondary | ICD-10-CM | POA: Diagnosis present

## 2018-03-16 DIAGNOSIS — L03116 Cellulitis of left lower limb: Secondary | ICD-10-CM

## 2018-03-16 DIAGNOSIS — Z9114 Patient's other noncompliance with medication regimen: Secondary | ICD-10-CM

## 2018-03-16 DIAGNOSIS — Z8349 Family history of other endocrine, nutritional and metabolic diseases: Secondary | ICD-10-CM

## 2018-03-16 DIAGNOSIS — Z8249 Family history of ischemic heart disease and other diseases of the circulatory system: Secondary | ICD-10-CM

## 2018-03-16 DIAGNOSIS — I480 Paroxysmal atrial fibrillation: Secondary | ICD-10-CM | POA: Diagnosis present

## 2018-03-16 DIAGNOSIS — I4510 Unspecified right bundle-branch block: Secondary | ICD-10-CM | POA: Diagnosis present

## 2018-03-16 DIAGNOSIS — Z9660 Presence of unspecified orthopedic joint implant: Secondary | ICD-10-CM | POA: Diagnosis present

## 2018-03-16 DIAGNOSIS — J302 Other seasonal allergic rhinitis: Secondary | ICD-10-CM | POA: Diagnosis present

## 2018-03-16 DIAGNOSIS — Z882 Allergy status to sulfonamides status: Secondary | ICD-10-CM

## 2018-03-16 DIAGNOSIS — K219 Gastro-esophageal reflux disease without esophagitis: Secondary | ICD-10-CM | POA: Diagnosis present

## 2018-03-16 DIAGNOSIS — Z9049 Acquired absence of other specified parts of digestive tract: Secondary | ICD-10-CM

## 2018-03-16 DIAGNOSIS — I4891 Unspecified atrial fibrillation: Secondary | ICD-10-CM | POA: Diagnosis present

## 2018-03-16 DIAGNOSIS — E0781 Sick-euthyroid syndrome: Secondary | ICD-10-CM | POA: Diagnosis present

## 2018-03-16 DIAGNOSIS — L0390 Cellulitis, unspecified: Secondary | ICD-10-CM | POA: Diagnosis present

## 2018-03-16 LAB — CBC WITH DIFFERENTIAL/PLATELET
ABS IMMATURE GRANULOCYTES: 0.01 10*3/uL (ref 0.00–0.07)
BASOS ABS: 0.1 10*3/uL (ref 0.0–0.1)
Basophils Relative: 1 %
EOS PCT: 1 %
Eosinophils Absolute: 0.1 10*3/uL (ref 0.0–0.5)
HEMATOCRIT: 56 % — AB (ref 39.0–52.0)
HEMOGLOBIN: 18.4 g/dL — AB (ref 13.0–17.0)
Immature Granulocytes: 0 %
LYMPHS ABS: 2.5 10*3/uL (ref 0.7–4.0)
LYMPHS PCT: 25 %
MCH: 29.3 pg (ref 26.0–34.0)
MCHC: 32.9 g/dL (ref 30.0–36.0)
MCV: 89.3 fL (ref 80.0–100.0)
MONO ABS: 0.5 10*3/uL (ref 0.1–1.0)
Monocytes Relative: 5 %
NEUTROS ABS: 6.9 10*3/uL (ref 1.7–7.7)
NRBC: 0 % (ref 0.0–0.2)
Neutrophils Relative %: 68 %
Platelets: 268 10*3/uL (ref 150–400)
RBC: 6.27 MIL/uL — ABNORMAL HIGH (ref 4.22–5.81)
RDW: 13.2 % (ref 11.5–15.5)
WBC: 10.1 10*3/uL (ref 4.0–10.5)

## 2018-03-16 LAB — BASIC METABOLIC PANEL
Anion gap: 7 (ref 5–15)
BUN: 13 mg/dL (ref 8–23)
CALCIUM: 9.3 mg/dL (ref 8.9–10.3)
CHLORIDE: 102 mmol/L (ref 98–111)
CO2: 28 mmol/L (ref 22–32)
CREATININE: 0.61 mg/dL (ref 0.61–1.24)
GFR calc non Af Amer: >60 mL/min (ref >60)
Glucose, Bld: 101 mg/dL — ABNORMAL HIGH (ref 70–99)
POTASSIUM: 4.1 mmol/L (ref 3.5–5.1)
Sodium: 137 mmol/L (ref 135–145)

## 2018-03-16 MED ORDER — VANCOMYCIN HCL 10 G IV SOLR
2000.0000 mg | Freq: Once | INTRAVENOUS | Status: AC
Start: 2018-03-16 — End: 2018-03-16
  Administered 2018-03-16: 2000 mg via INTRAVENOUS
  Filled 2018-03-16: qty 2000

## 2018-03-16 MED ORDER — SODIUM CHLORIDE 0.9 % IV BOLUS
500.0000 mL | Freq: Once | INTRAVENOUS | Status: AC
  Administered 2018-03-16: 500 mL via INTRAVENOUS

## 2018-03-16 MED ORDER — OMEPRAZOLE MAGNESIUM 20 MG PO TBEC: 20.0000 mg | DELAYED_RELEASE_TABLET | Freq: Every day | ORAL | Status: DC

## 2018-03-16 MED ORDER — SODIUM CHLORIDE 0.9 % IV SOLN
INTRAVENOUS | Status: DC
  Administered 2018-03-16: 20:00:00 via INTRAVENOUS

## 2018-03-16 MED ORDER — VANCOMYCIN HCL IN DEXTROSE 1-5 GM/200ML-% IV SOLN
1000.0000 mg | Freq: Two times a day (BID) | INTRAVENOUS | Status: DC
  Administered 2018-03-17: 1000 mg via INTRAVENOUS
  Filled 2018-03-16: qty 200

## 2018-03-16 MED ORDER — LORATADINE 10 MG PO TABS
10.0000 mg | ORAL_TABLET | Freq: Every day | ORAL | Status: DC
  Administered 2018-03-18: 10 mg via ORAL
  Filled 2018-03-16 (×3): qty 1

## 2018-03-16 MED ORDER — VANCOMYCIN HCL IN DEXTROSE 1-5 GM/200ML-% IV SOLN: 1000.0000 mg | Freq: Once | INTRAVENOUS | Status: DC

## 2018-03-16 MED ORDER — HEPARIN SODIUM (PORCINE) 5000 UNIT/ML IJ SOLN
5000.0000 [IU] | Freq: Three times a day (TID) | INTRAMUSCULAR | Status: DC
  Administered 2018-03-16 – 2018-03-18 (×5): 5000 [IU] via SUBCUTANEOUS
  Filled 2018-03-16 (×5): qty 1

## 2018-03-16 MED ORDER — PANTOPRAZOLE SODIUM 40 MG PO TBEC
40.0000 mg | DELAYED_RELEASE_TABLET | Freq: Every day | ORAL | Status: DC
  Administered 2018-03-17 – 2018-03-18 (×2): 40 mg via ORAL
  Filled 2018-03-16 (×3): qty 1

## 2018-03-16 NOTE — ED Provider Notes (Signed)
[facility] EMERGENCY DEPARTMENT Provider Note   CSN: [id number] Arrival date & time: 03/16/18  1032     History   Chief Complaint Chief Complaint  Patient presents with  . Wound Check    HPI Roy [name] is a 67 y.o. male.  Patient presents with erythema in the left lower extremity mid medial tibial area.  He thinks symptoms initially started with a chigger bite.  Seen by his primary care doctor early in the week and prescribed doxycycline.  Wound has not worsened with tenderness in the mid medial thigh and proximal medial tibia area.  He is not diabetic.  No fever or chills.  Severity of symptoms is moderate.  Palpation makes symptoms worse.     Past Medical History:  Diagnosis Date  . GERD (gastroesophageal reflux disease)   . Obesity     Patient Active Problem List   Diagnosis Date Noted  . Obesity (BMI 30-39.9) 05/04/2015  . Chronic anticoagulation 05/04/2015  . Trifascicular block 04/29/2015  . LAFB (left anterior fascicular block) 04/29/2015  . Essential hypertension   . Hypokalemia 04/28/2015  . RBBB 04/28/2015  . First degree AV block 04/28/2015  . PAF (paroxysmal atrial fibrillation) ([place]) 04/27/2015  . Dizziness 04/27/2015    Past Surgical History:  Procedure Laterality Date  . CHOLECYSTECTOMY    . JOINT REPLACEMENT          Home Medications    Prior to Admission medications   Medication Sig Start Date End Date Taking? Authorizing Provider  loratadine (CLARITIN) 10 MG tablet Take 1 tablet (10 mg total) by mouth daily. 02/26/18  Yes Idol, Almyra Free, PA-C  omeprazole (PRILOSEC OTC) 20 MG tablet Take 20 mg by mouth daily.   Yes [provider]  doxycycline (VIBRAMYCIN) 100 MG capsule Take 100 mg by mouth 2 (two) times daily.  03/11/18 03/21/18  [provider]  predniSONE (DELTASONE) 10 MG tablet Take 6 tablets day one, 5 tablets day two, 4 tablets day three, 3 tablets day four, 2 tablets day five, then 1 tablet day six Patient not  taking: Reported on 03/16/2018 02/26/18   Evalee Jefferson, PA-C    Family History Family History  Problem Relation Age of Onset  . Heart failure Father   . Heart attack Father        Deceased   . Thyroid disease Sister     Social History Social History   Tobacco Use  . Smoking status: Never Smoker  . Smokeless tobacco: Never Used  Substance Use Topics  . Alcohol use: No  . Drug use: No     Allergies   Adhesive [tape] and Sulfa antibiotics   Review of Systems Review of Systems  All other systems reviewed and are negative.    Physical Exam Updated Vital Signs BP 133/68   Pulse 63   Temp 98.1 F (36.7 C) (Oral)   Resp 18   Ht 6' (1.829 m)   Wt 116.1 kg   SpO2 96%   BMI 34.72 kg/m   Physical Exam  Constitutional: He is oriented to person, place, and time. He appears well-developed and well-nourished.  HENT:  Head: Normocephalic and atraumatic.  Eyes: Conjunctivae are normal.  Neck: Neck supple.  Cardiovascular: Normal rate and regular rhythm.  Pulmonary/Chest: Effort normal and breath sounds normal.  Abdominal: Soft. Bowel sounds are normal.  Musculoskeletal: Normal range of motion.  Neurological: He is alert and oriented to person, place, and time.  Skin:  Left lower extremity: Obvious  erythema in the mid medial tibia soft tissue area.  Additional tenderness in the mid medial thigh.  Initially, there is a small ruptured superficial vein.  Good hemostasis at this time.  Psychiatric: He has a normal mood and affect. His behavior is normal.  Nursing note and vitals reviewed.    ED Treatments / Results  Labs (all labs ordered are listed, but only abnormal results are displayed) Labs Reviewed  CBC WITH DIFFERENTIAL/PLATELET - Abnormal; Notable for the following components:      Result Value   RBC 6.27 (*)    Hemoglobin 18.4 (*)    HCT 56.0 (*)    All other components within normal limits  BASIC METABOLIC PANEL - Abnormal; Notable for the following  components:   Glucose, Bld 101 (*)    All other components within normal limits    EKG None  Radiology No results found.  Procedures Procedures (including critical care time)  Medications Ordered in ED Medications  sodium chloride 0.9 % bolus 500 mL (0 mLs Intravenous Stopped 03/16/18 1450)  vancomycin (VANCOCIN) 2,000 mg in sodium chloride 0.9 % 500 mL IVPB (0 mg Intravenous Stopped 03/16/18 1451)     Initial Impression / Assessment and Plan / ED Course  I have reviewed the triage vital signs and the nursing notes.  Pertinent labs & imaging results that were available during my care of the patient were reviewed by me and considered in my medical decision making (see chart for details).     Patient presents with worsening cellulitis of the left lower extremity.  He has failed outpatient treatment.  Will Rx IV vancomycin.  Admit.  Final Clinical Impressions(s) / ED Diagnoses   Final diagnoses:  Cellulitis of left lower extremity    ED Discharge Orders    None       Nat Christen, MD 03/16/18 [phone]

## 2018-03-16 NOTE — Progress Notes (Signed)
Pharmacy Antibiotic Note  Roy [name] is a 67 y.o. male admitted on 03/16/2018 with cellulitis.  Pharmacy has been consulted for Vancomycin dosing.  Plan: Vancomycin 2000mg  loading dose, then 1000mg   IV every 12 hours.  Goal trough 10-15 mcg/mL.  F/U cxs and clinical progress Monitor V/S, labs and levels as indicated  Height: 6' (182.9 cm) Weight: 256 lb (116.1 kg) IBW/kg (Calculated) : 77.6  Temp (24hrs), Avg:98.1 F (36.7 C), Min:98.1 F (36.7 C), Max:98.1 F (36.7 C)  Recent Labs  Lab 03/16/18 1259  WBC 10.1  CREATININE 0.61    Estimated Creatinine Clearance: 116.3 mL/min (by C-G formula based on SCr of 0.61 mg/dL).    Allergies  Allergen Reactions  . Adhesive [Tape]   . Sulfa Antibiotics     UNKNOWN Childhood reaction    Antimicrobials this admission: Vancomycin 11/10 >>   Dose adjustments this admission: N/a  Microbiology results: No cxs  Thank you for allowing pharmacy to be a part of this patient's care.  Isac Sarna, BS Pharm D, [place] Clinical Pharmacist Pager [phone] 03/16/2018 5:11 PM

## 2018-03-16 NOTE — H&P (Addendum)
History and Physical    DUGLAS HEIER JKD:[id number] DOB: Sep 25, 1950 DOA: 03/16/2018  Referring MD/NP/PA: Lacinda Axon PCP: Dione Housekeeper, MD  Outpatient Specialists: none   Patient coming from: Home   Chief Complaint: LLE cellulitis   HPI: Roy [name] is a 67 y.o. male with medical history significant for GERD presenting with left lower extreme the cellulitis.  Patient reports aggressive redness over the left lower extremity over the past 1 to 2 weeks.  Patient believes he may came from an insect/chigger bite.  No fevers or chills.  No nausea vomiting.  Patient does work outside. No reported tobacco abuse. Pt denies any hx/o DM.  Patient was seen by PCP and placed on oral doxycycline approximately 1 week ago.  Symptoms have minimally improved and appears to have worsened.  Denies any chest pain, shortness of breath.  No abdominal pain.  Does report some minimal purulent drainage. ED Course: Presented to the ER afebrile, hemodynamically stable.  White count 10.1.  Creatinine 0.6.  Review of Systems: As per HPI otherwise 10 point review of systems negative.    Past Medical History:  Diagnosis Date  . GERD (gastroesophageal reflux disease)   . Obesity     Past Surgical History:  Procedure Laterality Date  . CHOLECYSTECTOMY    . JOINT REPLACEMENT       reports that he has never smoked. He has never used smokeless tobacco. He reports that he does not drink alcohol or use drugs.  Allergies  Allergen Reactions  . Adhesive [Tape]   . Sulfa Antibiotics     UNKNOWN Childhood reaction    Family History  Problem Relation Age of Onset  . Heart failure Father   . Heart attack Father        Deceased   . Thyroid disease Sister     Prior to Admission medications   Medication Sig Start Date End Date Taking? Authorizing Provider  loratadine (CLARITIN) 10 MG tablet Take 1 tablet (10 mg total) by mouth daily. 02/26/18  Yes Idol, Almyra Free, PA-C  omeprazole (PRILOSEC OTC) 20 MG tablet Take 20  mg by mouth daily.   Yes [provider]  doxycycline (VIBRAMYCIN) 100 MG capsule Take 100 mg by mouth 2 (two) times daily.  03/11/18 03/21/18  [provider]  predniSONE (DELTASONE) 10 MG tablet Take 6 tablets day one, 5 tablets day two, 4 tablets day three, 3 tablets day four, 2 tablets day five, then 1 tablet day six Patient not taking: Reported on 03/16/2018 02/26/18   Evalee Jefferson, PA-C    Physical Exam: Vitals:   03/16/18 1120 03/16/18 1122 03/16/18 1330  BP:  (!) 171/101 133/68  Pulse: 77  63  Resp: 19  18  Temp: 98.1 F (36.7 C)    TempSrc: Oral    SpO2: 97%  96%  Weight: 116.1 kg    Height: 6' (1.829 m)        Constitutional: NAD, calm, comfortable Vitals:   03/16/18 1120 03/16/18 1122 03/16/18 1330  BP:  (!) 171/101 133/68  Pulse: 77  63  Resp: 19  18  Temp: 98.1 F (36.7 C)    TempSrc: Oral    SpO2: 97%  96%  Weight: 116.1 kg    Height: 6' (1.829 m)     Eyes: PERRL, lids and conjunctivae normal ENMT: Mucous membranes are moist. Posterior pharynx clear of any exudate or lesions.Normal dentition.  Neck: normal, supple, no masses, no thyromegaly Respiratory: clear to auscultation bilaterally, no wheezing,  no crackles. Normal respiratory effort. No accessory muscle use.  Cardiovascular: Regular rate and rhythm, no murmurs / rubs / gallops. No extremity edema. 2+ pedal pulses. No carotid bruits.  Abdomen: no tenderness, no masses palpated. No hepatosplenomegaly. Bowel sounds positive.  Musculoskeletal: no clubbing / cyanosis. No joint deformity upper and lower extremities. Good ROM, no contractures. Normal muscle tone.  Skin: + redness and erythema over anterior ankle of LLE. Trace scab. Neurologic: CN 2-12 grossly intact. Sensation intact, DTR normal. Strength 5/5 in all 4.  Psychiatric: Normal judgment and insight. Alert and oriented x 3. Normal mood.    Labs on Admission: I have personally reviewed following labs and imaging  studies  CBC: Recent Labs  Lab 03/16/18 1259  WBC 10.1  NEUTROABS 6.9  HGB 18.4*  HCT 56.0*  MCV 89.3  PLT 536   Basic Metabolic Panel: Recent Labs  Lab 03/16/18 1259  NA 137  K 4.1  CL 102  CO2 28  GLUCOSE 101*  BUN 13  CREATININE 0.61  CALCIUM 9.3   GFR: Estimated Creatinine Clearance: 116.3 mL/min (by C-G formula based on SCr of 0.61 mg/dL). Liver Function Tests: No results for input(s): AST, ALT, ALKPHOS, BILITOT, PROT, ALBUMIN in the last 168 hours. No results for input(s): LIPASE, AMYLASE in the last 168 hours. No results for input(s): AMMONIA in the last 168 hours. Coagulation Profile: No results for input(s): INR, PROTIME in the last 168 hours. Cardiac Enzymes: No results for input(s): CKTOTAL, CKMB, CKMBINDEX, TROPONINI in the last 168 hours. BNP (last 3 results) No results for input(s): PROBNP in the last 8760 hours. HbA1C: No results for input(s): HGBA1C in the last 72 hours. CBG: No results for input(s): GLUCAP in the last 168 hours. Lipid Profile: No results for input(s): CHOL, HDL, LDLCALC, TRIG, CHOLHDL, LDLDIRECT in the last 72 hours. Thyroid Function Tests: No results for input(s): TSH, T4TOTAL, FREET4, T3FREE, THYROIDAB in the last 72 hours. Anemia Panel: No results for input(s): VITAMINB12, FOLATE, FERRITIN, TIBC, IRON, RETICCTPCT in the last 72 hours. Urine analysis:    Component Value Date/Time   COLORURINE YELLOW 05/15/2010 2059   APPEARANCEUR CLEAR 05/15/2010 2059   LABSPEC 1.015 05/15/2010 2059   PHURINE 5.5 05/15/2010 2059   GLUCOSEU NEGATIVE 07/21/2009 2043   HGBUR NEGATIVE 05/15/2010 2059   BILIRUBINUR NEGATIVE 05/15/2010 2059   KETONESUR NEGATIVE 05/15/2010 2059   PROTEINUR NEGATIVE 05/15/2010 2059   UROBILINOGEN 1.0 05/15/2010 2059   NITRITE NEGATIVE 05/15/2010 2059   LEUKOCYTESUR  05/15/2010 2059    NEGATIVE MICROSCOPIC NOT DONE ON URINES WITH NEGATIVE PROTEIN, BLOOD, LEUKOCYTES, NITRITE, OR GLUCOSE <1000 mg/dL.   Sepsis  Labs: @LABRCNTIP (procalcitonin:4,lacticidven:4) )No results found for this or any previous visit (from the past 240 hour(s)).   Radiological Exams on Admission: No results found.   Assessment/Plan Active Problems:   Cellulitis   1-Cellulitis  -failed outpt management w/ oral doxy  -IV vanc  -blood cultures  -pending demarkation of affected area to assess for any progress   2-GERD -cont PPI   3-Seasonal Allergies -cont anti-histamine   DVT prophylaxis: SQH  Code Status: Full Code   Family Communication: No family at bedside   Disposition Plan: Pending further evaluation   Consults called: None   Admission status: Obs    Deneise Lever MD [facility] Pager [phone]  If 7PM-7AM, please contact night-coverage [email or web address] Password [facility]  03/16/2018, 7:29 PM

## 2018-03-16 NOTE — ED Triage Notes (Signed)
Patient seen in Emergency room on 11/05 and diagnosed with cellulitis to left lower leg from possible bug bites. Patient given doxycycline. Per patient wound to left lower leg progressively getting worse. Red streaking noted with warmth and tenderness to touch. Denies any fevers.

## 2018-03-17 ENCOUNTER — Encounter (HOSPITAL_COMMUNITY): Payer: Self-pay

## 2018-03-17 DIAGNOSIS — J302 Other seasonal allergic rhinitis: Secondary | ICD-10-CM | POA: Diagnosis present

## 2018-03-17 DIAGNOSIS — I480 Paroxysmal atrial fibrillation: Secondary | ICD-10-CM

## 2018-03-17 DIAGNOSIS — Z9114 Patient's other noncompliance with medication regimen: Secondary | ICD-10-CM | POA: Diagnosis not present

## 2018-03-17 DIAGNOSIS — L03116 Cellulitis of left lower limb: Secondary | ICD-10-CM | POA: Diagnosis not present

## 2018-03-17 DIAGNOSIS — K219 Gastro-esophageal reflux disease without esophagitis: Secondary | ICD-10-CM | POA: Diagnosis present

## 2018-03-17 DIAGNOSIS — Z9660 Presence of unspecified orthopedic joint implant: Secondary | ICD-10-CM | POA: Diagnosis present

## 2018-03-17 DIAGNOSIS — E0781 Sick-euthyroid syndrome: Secondary | ICD-10-CM | POA: Diagnosis present

## 2018-03-17 DIAGNOSIS — Z882 Allergy status to sulfonamides status: Secondary | ICD-10-CM | POA: Diagnosis not present

## 2018-03-17 DIAGNOSIS — I4510 Unspecified right bundle-branch block: Secondary | ICD-10-CM | POA: Diagnosis not present

## 2018-03-17 DIAGNOSIS — I10 Essential (primary) hypertension: Secondary | ICD-10-CM | POA: Diagnosis not present

## 2018-03-17 DIAGNOSIS — Z8349 Family history of other endocrine, nutritional and metabolic diseases: Secondary | ICD-10-CM | POA: Diagnosis not present

## 2018-03-17 DIAGNOSIS — Z9049 Acquired absence of other specified parts of digestive tract: Secondary | ICD-10-CM | POA: Diagnosis not present

## 2018-03-17 DIAGNOSIS — Z8249 Family history of ischemic heart disease and other diseases of the circulatory system: Secondary | ICD-10-CM | POA: Diagnosis not present

## 2018-03-17 DIAGNOSIS — Z91048 Other nonmedicinal substance allergy status: Secondary | ICD-10-CM | POA: Diagnosis not present

## 2018-03-17 LAB — COMPREHENSIVE METABOLIC PANEL
ALK PHOS: 64 U/L (ref 38–126)
ALT: 16 U/L (ref 0–44)
AST: 21 U/L (ref 15–41)
Albumin: 3.4 g/dL — ABNORMAL LOW (ref 3.5–5.0)
Anion gap: 7 (ref 5–15)
BUN: 12 mg/dL (ref 8–23)
CALCIUM: 9.1 mg/dL (ref 8.9–10.3)
CO2: 28 mmol/L (ref 22–32)
CREATININE: 0.71 mg/dL (ref 0.61–1.24)
Chloride: 104 mmol/L (ref 98–111)
GFR calc non Af Amer: >60 mL/min (ref >60)
Glucose, Bld: 106 mg/dL — ABNORMAL HIGH (ref 70–99)
Potassium: 3.6 mmol/L (ref 3.5–5.1)
SODIUM: 139 mmol/L (ref 135–145)
Total Bilirubin: 2.4 mg/dL — ABNORMAL HIGH (ref 0.3–1.2)
Total Protein: 6.8 g/dL (ref 6.5–8.1)

## 2018-03-17 LAB — CBC WITH DIFFERENTIAL/PLATELET
Abs Immature Granulocytes: 0.01 10*3/uL (ref 0.00–0.07)
BASOS ABS: 0.1 10*3/uL (ref 0.0–0.1)
Basophils Relative: 1 %
EOS ABS: 0.1 10*3/uL (ref 0.0–0.5)
EOS PCT: 1 %
HEMATOCRIT: 52.7 % — AB (ref 39.0–52.0)
Hemoglobin: 17.5 g/dL — ABNORMAL HIGH (ref 13.0–17.0)
Immature Granulocytes: 0 %
LYMPHS ABS: 3.4 10*3/uL (ref 0.7–4.0)
Lymphocytes Relative: 36 %
MCH: 29.5 pg (ref 26.0–34.0)
MCHC: 33.2 g/dL (ref 30.0–36.0)
MCV: 88.7 fL (ref 80.0–100.0)
Monocytes Absolute: 0.6 10*3/uL (ref 0.1–1.0)
Monocytes Relative: 6 %
NRBC: 0 % (ref 0.0–0.2)
Neutro Abs: 5.2 10*3/uL (ref 1.7–7.7)
Neutrophils Relative %: 56 %
Platelets: 213 10*3/uL (ref 150–400)
RBC: 5.94 MIL/uL — ABNORMAL HIGH (ref 4.22–5.81)
RDW: 13.2 % (ref 11.5–15.5)
WBC: 9.3 10*3/uL (ref 4.0–10.5)

## 2018-03-17 LAB — TSH: TSH: 3.836 u[IU]/mL (ref 0.350–4.500)

## 2018-03-17 LAB — BILIRUBIN, FRACTIONATED(TOT/DIR/INDIR)
Bilirubin, Direct: 0.3 mg/dL — ABNORMAL HIGH (ref 0.0–0.2)
Indirect Bilirubin: 1.9 mg/dL — ABNORMAL HIGH (ref 0.3–0.9)
Total Bilirubin: 2.2 mg/dL — ABNORMAL HIGH (ref 0.3–1.2)

## 2018-03-17 LAB — LACTATE DEHYDROGENASE: LDH: 98 U/L (ref 98–192)

## 2018-03-17 LAB — T4, FREE: Free T4: 0.98 ng/dL (ref 0.82–1.77)

## 2018-03-17 MED ORDER — CEFAZOLIN SODIUM-DEXTROSE 2-4 GM/100ML-% IV SOLN
2.0000 g | Freq: Three times a day (TID) | INTRAVENOUS | Status: DC
  Administered 2018-03-17 – 2018-03-18 (×4): 2 g via INTRAVENOUS
  Filled 2018-03-17 (×8): qty 100

## 2018-03-17 NOTE — Discharge Summary (Signed)
Physician Discharge Summary  Roy [name] PTW:[id number] DOB: 1951-04-19 DOA: 03/16/2018  PCP: Dione Housekeeper, MD  Admit date: 03/16/2018 Discharge date: 03/18/2018  Admitted From: Home Disposition:  Home   Recommendations for Outpatient Follow-up:  1. Follow up with PCP in 1-2 weeks 2. Please obtain BMP/CBC in one week    Discharge Condition: Stable CODE STATUS: FULL Diet recommendation: Heart Healthy   Brief/Interim Summary: 67 year old male with a history of paroxysmal atrial fibrillation, right bundle branch block, GERD presenting with 1 week history of increasing left lower extremity edema and erythema around his pretibial area.  Patient states that approximately 2 weeks ago he was covered with "chigger bites".  The patient visited the emergency department on 02/26/2018.  He was sent home with prednisone.  He stated that the rash from his chigger bites improved.  However, 10 days prior to this admission, the patient developed erythema and edema and pain on his left his left pretibial area.  He denies any new injury or trauma.  Patient went to see his primary care provider on 03/11/2018.  Was placed on doxycycline.  He was compliant with medications.  He stated that he had increasing pain, erythema, edema spreading proximally.  As result, the patient came to emergency department for further evaluation.  He denies any fevers, chills, nausea, vomiting, diarrhea, abdominal pain, dysuria, hematuria.  He denies any other rashes.  Upon presentation, patient was noted to have WBC 10.1.  Start IV vancomycin initially which was changed to cefazolin.  Pt improved clinically.    Discharge Diagnoses:  Cellulitis of the left lower extremity -The patient failed outpatient doxycycline -He continued to have increasing pain, erythema, edema -Discontinue vancomycin -Started IV cefazolin -d/c home with cephalexin x  more days to finish 8 day course of abx -A.m. CBC  Paroxysmal atrial  fibrillation -Previously taking rivaroxaban--he stopped because he could not afford -CHADSVASc = 2 -ask case management regarding co-pays of NOAC-->$300 -discussed benefits/risks, alternatives with pt including warfarin and ASA -pt wants to take xarelto and follow up with his PCP to possibly help him filling out papers for financial assistance with xarelto -30-day voucher given to pt prior to discharge to start xarelto -place on tele-->afib with slow ventricular response -lost to follow up with cardiology -ekg--personally reviewed--Afib with RBBB -TSH--6.505 -Free T4--0.98, consistent with euthyroid sick syndrome -rate controlled  Essential hypertension -Presently not on any medications -He has been following his primary care provider whom feels this may be in part due to "white coat hypertension" -However, his blood pressure remains elevated during his hospitalization -He has had documented elevated blood pressures dating back to 2016 -start amlodipine 2.5 mg daily -follow up with PCP to titrate  GERD -Continue PPI  Hyperbilirubinemia -Mostly indirect -Suspect Gilbert's -Check LDH--98 -haptoglobin -no abdominal pain   Discharge Instructions   Allergies as of 03/18/2018      Reactions   Adhesive [tape]    Sulfa Antibiotics    UNKNOWN Childhood reaction      Medication List    STOP taking these medications   doxycycline 100 MG capsule Commonly known as:  VIBRAMYCIN   predniSONE 10 MG tablet Commonly known as:  DELTASONE     TAKE these medications   amLODipine 2.5 MG tablet Commonly known as:  NORVASC Take 1 tablet (2.5 mg total) by mouth daily.   cephALEXin 500 MG capsule Commonly known as:  KEFLEX Take 1 capsule (500 mg total) by mouth 4 (four) times daily.   loratadine 10 MG  tablet Commonly known as:  CLARITIN Take 1 tablet (10 mg total) by mouth daily.   omeprazole 20 MG tablet Commonly known as:  PRILOSEC OTC Take 20 mg by mouth daily.     rivaroxaban 20 MG Tabs tablet Commonly known as:  XARELTO Take 1 tablet (20 mg total) by mouth daily with supper.       Allergies  Allergen Reactions  . Adhesive [Tape]   . Sulfa Antibiotics     UNKNOWN Childhood reaction    Consultations:  none   Procedures/Studies: No results found.      Discharge Exam: Vitals:   03/18/18 0531 03/18/18 0807  BP: (!) 141/91   Pulse: (!) 48   Resp: 18   Temp: 98 F (36.7 C)   SpO2: 96% 98%   Vitals:   03/17/18 1520 03/17/18 2224 03/18/18 0531 03/18/18 0807  BP: (!) 120/53 131/63 (!) 141/91   Pulse: (!) 52 (!) 54 (!) 48   Resp: 20 20 18    Temp: 98.2 F (36.8 C) 97.7 F (36.5 C) 98 F (36.7 C)   TempSrc: Oral Oral Oral   SpO2: 98% 96% 96% 98%  Weight:      Height:        General: Pt is alert, awake, not in acute distress Cardiovascular: RRR, S1/S2 +, no rubs, no gallops Respiratory: CTA bilaterally, no wheezing, no rhonchi Abdominal: Soft, NT, ND, bowel sounds + Extremities: improving erythema left pretibial area. No drainage, necrosis or crepitance   The results of significant diagnostics from this hospitalization (including imaging, microbiology, ancillary and laboratory) are listed below for reference.    Significant Diagnostic Studies: No results found.   Microbiology: No results found for this or any previous visit (from the past 240 hour(s)).   Labs: Basic Metabolic Panel: Recent Labs  Lab 03/16/18 1259 03/17/18 0557 03/18/18 0455  NA 137 139 140  K 4.1 3.6 3.6  CL 102 104 101  CO2 28 28 31   GLUCOSE 101* 106* 87  BUN 13 12 14   CREATININE 0.61 0.71 0.78  CALCIUM 9.3 9.1 9.2   Liver Function Tests: Recent Labs  Lab 03/17/18 0557 03/18/18 0455  AST 21 18  ALT 16 13  ALKPHOS 64 66  BILITOT 2.2*  2.4* 1.9*  PROT 6.8 7.1  ALBUMIN 3.4* 3.5   No results for input(s): LIPASE, AMYLASE in the last 168 hours. No results for input(s): AMMONIA in the last 168 hours. CBC: Recent Labs  Lab  03/16/18 1259 03/17/18 0557 03/18/18 0455  WBC 10.1 9.3 9.4  NEUTROABS 6.9 5.2  --   HGB 18.4* 17.5* 16.9  HCT 56.0* 52.7* 51.7  MCV 89.3 88.7 89.0  PLT 268 213 246   Cardiac Enzymes: No results for input(s): CKTOTAL, CKMB, CKMBINDEX, TROPONINI in the last 168 hours. BNP: Invalid input(s): POCBNP CBG: No results for input(s): GLUCAP in the last 168 hours.  Time coordinating discharge:  36 minutes  Signed:  [name], DO [facility] Pager: [phone] 03/18/2018, 11:37 AM

## 2018-03-17 NOTE — Progress Notes (Signed)
**Note De-identified Roy [name] Obfuscation** EKG complete and placed in patient chart 

## 2018-03-17 NOTE — Plan of Care (Signed)

## 2018-03-17 NOTE — Progress Notes (Signed)
PROGRESS NOTE  Roy [name] WNI:[id number] DOB: March 03, 1951 DOA: 03/16/2018 PCP: Dione Housekeeper, MD  Brief History:  67 year old male with a history of paroxysmal atrial fibrillation, right bundle branch block, GERD presenting with 1 week history of increasing left lower extremity edema and erythema around his pretibial area.  Patient states that approximately 2 weeks ago he was covered with "chigger bites".  The patient visited the emergency department on 02/26/2018.  He was sent home with prednisone.  He stated that the rash from his chigger bites improved.  However, 10 days prior to this admission, the patient developed erythema and edema and pain on his left his left pretibial area.  He denies any new injury or trauma.  Patient went to see his primary care provider on 03/11/2018.  Was placed on doxycycline.  He was compliant with medications.  He stated that he had increasing pain, erythema, edema spreading proximally.  As result, the patient came to emergency department for further evaluation.  He denies any fevers, chills, nausea, vomiting, diarrhea, abdominal pain, dysuria, hematuria.  He denies any other rashes.  Upon presentation, patient was noted to have WBC 10.1.  Start IV vancomycin.  Assessment/Plan: Cellulitis of the left lower extremity -The patient failed outpatient doxycycline -He continued to have increasing pain, erythema, edema -Discontinue vancomycin -Start IV cefazolin -A.m. CBC  Paroxysmal atrial fibrillation -Previously taking rivaroxaban--he stopped because he could not afford -CHADSVASc = 2 -ask case management regarding co-pays of NOAC -place on tele -lost to follow up with cardiology -ekg -TSH -rate controlled  Essential hypertension -Presently not on any medications -He has been following his primary care provider whom feels this may be in part due to "white coat hypertension" -However, his blood pressure remains elevated during his  hospitalization -He has had documented elevated blood pressures dating back to 2016  GERD -Continue PPI  Hyperbilirubinemia -Mostly indirect -Suspect Gilbert's -Check LDH and haptoglobin -no abdominal pain     Disposition Plan:   Home 03/18/18 if stable Family Communication:  No Family at bedside  Consultants:  none  Code Status:  FULL  DVT Prophylaxis:  Delight Heparin    Procedures: As Listed in Progress Note Above  Antibiotics: None      Subjective: He continues to have some pain in his left lower extremity.  It is a little bit better.  He denies any chest pain, shortness breath, nausea, vomiting, diarrhea, abdominal pain, dysuria, hematuria.  Denies any recent trauma to his left leg.  He denies any drainage in his left leg.  Objective: Vitals:   03/16/18 2100 03/17/18 0106 03/17/18 0127 03/17/18 0556  BP: 133/68 124/67 (!) 157/83 (!) 155/91  Pulse: 64 65 (!) 56 (!) 113  Resp: 18 17 18 18   Temp:   97.7 F (36.5 C) 97.7 F (36.5 C)  TempSrc:    Oral  SpO2: 97% 97% 99% (!) 87%  Weight:      Height:        Intake/Output Summary (Last 24 hours) at 03/17/2018 0750 Last data filed at 03/17/2018 0500 Gross per 24 hour  Intake 1040.98 ml  Output -  Net 1040.98 ml   Weight change:  Exam:   General:  Pt is alert, follows commands appropriately, not in acute distress  HEENT: No icterus, No thrush, No neck mass, [place]/AT  Cardiovascular: IRRR, S1/S2, no rubs, no gallops  Respiratory: CTA bilaterally, no wheezing, no crackles, no rhonchi  Abdomen: Soft/+BS, non  tender, non distended, no guarding  Extremities: Mild erythema on his left pretibial area around some varicose veins.  There is no active drainage, necrosis, open wounds.  Please see pictures below of left lower extremity     Data Reviewed: I have personally reviewed following labs and imaging studies Basic Metabolic Panel: Recent Labs  Lab 03/16/18 1259 03/17/18 0557  NA 137 139  K 4.1 3.6    CL 102 104  CO2 28 28  GLUCOSE 101* 106*  BUN 13 12  CREATININE 0.61 0.71  CALCIUM 9.3 9.1   Liver Function Tests: Recent Labs  Lab 03/17/18 0557  AST 21  ALT 16  ALKPHOS 64  BILITOT 2.2*  2.4*  PROT 6.8  ALBUMIN 3.4*   No results for input(s): LIPASE, AMYLASE in the last 168 hours. No results for input(s): AMMONIA in the last 168 hours. Coagulation Profile: No results for input(s): INR, PROTIME in the last 168 hours. CBC: Recent Labs  Lab 03/16/18 1259 03/17/18 0557  WBC 10.1 9.3  NEUTROABS 6.9 5.2  HGB 18.4* 17.5*  HCT 56.0* 52.7*  MCV 89.3 88.7  PLT 268 213   Cardiac Enzymes: No results for input(s): CKTOTAL, CKMB, CKMBINDEX, TROPONINI in the last 168 hours. BNP: Invalid input(s): POCBNP CBG: No results for input(s): GLUCAP in the last 168 hours. HbA1C: No results for input(s): HGBA1C in the last 72 hours. Urine analysis:    Component Value Date/Time   COLORURINE YELLOW 05/15/2010 2059   APPEARANCEUR CLEAR 05/15/2010 2059   LABSPEC 1.015 05/15/2010 2059   PHURINE 5.5 05/15/2010 2059   GLUCOSEU NEGATIVE 07/21/2009 2043   HGBUR NEGATIVE 05/15/2010 2059   BILIRUBINUR NEGATIVE 05/15/2010 2059   KETONESUR NEGATIVE 05/15/2010 2059   PROTEINUR NEGATIVE 05/15/2010 2059   UROBILINOGEN 1.0 05/15/2010 2059   NITRITE NEGATIVE 05/15/2010 2059   LEUKOCYTESUR  05/15/2010 2059    NEGATIVE MICROSCOPIC NOT DONE ON URINES WITH NEGATIVE PROTEIN, BLOOD, LEUKOCYTES, NITRITE, OR GLUCOSE <1000 mg/dL.   Sepsis Labs: @LABRCNTIP (procalcitonin:4,lacticidven:4) )No results found for this or any previous visit (from the past 240 hour(s)).   Scheduled Meds: . heparin  5,000 Units Subcutaneous Q8H  . loratadine  10 mg Oral Daily  . pantoprazole  40 mg Oral Daily   Continuous Infusions: . sodium chloride 50 mL/hr at 03/16/18 1939  . vancomycin 1,000 mg (03/17/18 0426)    Procedures/Studies: No results found.  [name], DO  [facility] Pager  [phone]  If 7PM-7AM, please contact night-coverage [email or web address] Password [email or web address] 03/17/2018, 7:50 AM   LOS: 0 days

## 2018-03-18 LAB — HIV ANTIBODY (ROUTINE TESTING W REFLEX): HIV Screen 4th Generation wRfx: NONREACTIVE

## 2018-03-18 LAB — COMPREHENSIVE METABOLIC PANEL
ALBUMIN: 3.5 g/dL (ref 3.5–5.0)
ALK PHOS: 66 U/L (ref 38–126)
ALT: 13 U/L (ref 0–44)
AST: 18 U/L (ref 15–41)
Anion gap: 8 (ref 5–15)
BILIRUBIN TOTAL: 1.9 mg/dL — AB (ref 0.3–1.2)
BUN: 14 mg/dL (ref 8–23)
CO2: 31 mmol/L (ref 22–32)
Calcium: 9.2 mg/dL (ref 8.9–10.3)
Chloride: 101 mmol/L (ref 98–111)
Creatinine, Ser: 0.78 mg/dL (ref 0.61–1.24)
GFR calc Af Amer: >60 mL/min (ref >60)
GLUCOSE: 87 mg/dL (ref 70–99)
POTASSIUM: 3.6 mmol/L (ref 3.5–5.1)
Sodium: 140 mmol/L (ref 135–145)
TOTAL PROTEIN: 7.1 g/dL (ref 6.5–8.1)

## 2018-03-18 LAB — CBC
HCT: 51.7 % (ref 39.0–52.0)
HEMOGLOBIN: 16.9 g/dL (ref 13.0–17.0)
MCH: 29.1 pg (ref 26.0–34.0)
MCHC: 32.7 g/dL (ref 30.0–36.0)
MCV: 89 fL (ref 80.0–100.0)
Platelets: 246 10*3/uL (ref 150–400)
RBC: 5.81 MIL/uL (ref 4.22–5.81)
RDW: 13.3 % (ref 11.5–15.5)
WBC: 9.4 10*3/uL (ref 4.0–10.5)
nRBC: 0 % (ref 0.0–0.2)

## 2018-03-18 LAB — HAPTOGLOBIN: Haptoglobin: 137 mg/dL (ref 34–200)

## 2018-03-18 MED ORDER — AMLODIPINE BESYLATE 5 MG PO TABS
2.5000 mg | ORAL_TABLET | Freq: Every day | ORAL | Status: DC
  Administered 2018-03-18: 2.5 mg via ORAL
  Filled 2018-03-18: qty 1

## 2018-03-18 MED ORDER — RIVAROXABAN 20 MG PO TABS: 20.0000 mg | ORAL_TABLET | Freq: Every day | ORAL | Status: DC

## 2018-03-18 MED ORDER — RIVAROXABAN 20 MG PO TABS: 20.0000 mg | ORAL_TABLET | Freq: Every day | ORAL | 0 refills | Status: DC

## 2018-03-18 MED ORDER — CEPHALEXIN 500 MG PO CAPS: 500.0000 mg | ORAL_CAPSULE | Freq: Four times a day (QID) | ORAL | 0 refills | Status: DC

## 2018-03-18 MED ORDER — AMLODIPINE BESYLATE 2.5 MG PO TABS: 2.5000 mg | ORAL_TABLET | Freq: Every day | ORAL | 1 refills | Status: DC

## 2018-03-18 NOTE — Progress Notes (Signed)
IV discontinued,catheter intact. Discharged home with instructions given on medications and follow up visits,patient verbalized understanding. Prescriptions sent to Pharmacy of choice documented on AVS. Accompanied by staff to an awaiting vehicle. 

## 2018-03-18 NOTE — Care Management (Signed)
Benefits check:  Pt. Has no Pharmacy benefits listed.  Medicare A and B only..    Pt has decided to use 30-day card and apply for financial assistance at PCP office. CM encouraged pt to contact drug company as soon as possible because they can let them know quickly if he would qualify for the assistance program. CM also encouraged pt to get part D added.

## 2018-03-25 DIAGNOSIS — L03116 Cellulitis of left lower limb: Secondary | ICD-10-CM | POA: Diagnosis not present

## 2018-03-25 DIAGNOSIS — I480 Paroxysmal atrial fibrillation: Secondary | ICD-10-CM | POA: Diagnosis not present

## 2018-03-25 DIAGNOSIS — Z0000 Encounter for general adult medical examination without abnormal findings: Secondary | ICD-10-CM | POA: Diagnosis not present

## 2018-03-25 DIAGNOSIS — Z1159 Encounter for screening for other viral diseases: Secondary | ICD-10-CM | POA: Diagnosis not present

## 2018-03-25 DIAGNOSIS — Z125 Encounter for screening for malignant neoplasm of prostate: Secondary | ICD-10-CM | POA: Diagnosis not present

## 2018-03-25 DIAGNOSIS — I10 Essential (primary) hypertension: Secondary | ICD-10-CM | POA: Diagnosis not present

## 2018-03-26 ENCOUNTER — Other Ambulatory Visit: Payer: Self-pay

## 2018-03-26 ENCOUNTER — Emergency Department (HOSPITAL_COMMUNITY)
Admission: EM | Admit: 2018-03-26 | Discharge: 2018-03-26 | Disposition: A | Discharge status: Home / Self Care | Payer: Medicare Other | Attending: Emergency Medicine | Admitting: Emergency Medicine

## 2018-03-26 ENCOUNTER — Encounter (HOSPITAL_COMMUNITY): Payer: Self-pay

## 2018-03-26 ENCOUNTER — Emergency Department (HOSPITAL_COMMUNITY): Payer: Medicare Other

## 2018-03-26 DIAGNOSIS — R0789 Other chest pain: Secondary | ICD-10-CM | POA: Diagnosis not present

## 2018-03-26 DIAGNOSIS — Z79899 Other long term (current) drug therapy: Secondary | ICD-10-CM | POA: Diagnosis not present

## 2018-03-26 DIAGNOSIS — I10 Essential (primary) hypertension: Secondary | ICD-10-CM | POA: Insufficient documentation

## 2018-03-26 DIAGNOSIS — Z7901 Long term (current) use of anticoagulants: Secondary | ICD-10-CM | POA: Diagnosis not present

## 2018-03-26 DIAGNOSIS — R079 Chest pain, unspecified: Secondary | ICD-10-CM | POA: Diagnosis not present

## 2018-03-26 LAB — COMPREHENSIVE METABOLIC PANEL
ALT: 19 U/L (ref 0–44)
AST: 24 U/L (ref 15–41)
Albumin: 4.2 g/dL (ref 3.5–5.0)
Alkaline Phosphatase: 86 U/L (ref 38–126)
Anion gap: 10 (ref 5–15)
BUN: 11 mg/dL (ref 8–23)
CHLORIDE: 99 mmol/L (ref 98–111)
CO2: 28 mmol/L (ref 22–32)
Calcium: 9.9 mg/dL (ref 8.9–10.3)
Creatinine, Ser: 0.62 mg/dL (ref 0.61–1.24)
GFR calc Af Amer: >60 mL/min (ref >60)
Glucose, Bld: 123 mg/dL — ABNORMAL HIGH (ref 70–99)
Potassium: 3.7 mmol/L (ref 3.5–5.1)
Sodium: 137 mmol/L (ref 135–145)
Total Bilirubin: 1.7 mg/dL — ABNORMAL HIGH (ref 0.3–1.2)
Total Protein: 8.7 g/dL — ABNORMAL HIGH (ref 6.5–8.1)

## 2018-03-26 LAB — CBC WITH DIFFERENTIAL/PLATELET
ABS IMMATURE GRANULOCYTES: 0.04 10*3/uL (ref 0.00–0.07)
BASOS PCT: 1 %
Basophils Absolute: 0.1 10*3/uL (ref 0.0–0.1)
Eosinophils Absolute: 0.1 10*3/uL (ref 0.0–0.5)
Eosinophils Relative: 1 %
HEMATOCRIT: 55.6 % — AB (ref 39.0–52.0)
HEMOGLOBIN: 18.4 g/dL — AB (ref 13.0–17.0)
IMMATURE GRANULOCYTES: 0 %
LYMPHS ABS: 2.7 10*3/uL (ref 0.7–4.0)
LYMPHS PCT: 22 %
MCH: 28.9 pg (ref 26.0–34.0)
MCHC: 33.1 g/dL (ref 30.0–36.0)
MCV: 87.3 fL (ref 80.0–100.0)
MONOS PCT: 6 %
Monocytes Absolute: 0.7 10*3/uL (ref 0.1–1.0)
NEUTROS ABS: 8.5 10*3/uL — AB (ref 1.7–7.7)
NEUTROS PCT: 70 %
PLATELETS: 319 10*3/uL (ref 150–400)
RBC: 6.37 MIL/uL — ABNORMAL HIGH (ref 4.22–5.81)
RDW: 13 % (ref 11.5–15.5)
WBC: 12.1 10*3/uL — ABNORMAL HIGH (ref 4.0–10.5)
nRBC: 0 % (ref 0.0–0.2)

## 2018-03-26 LAB — PROTIME-INR
INR: 1.08
Prothrombin Time: 13.9 seconds (ref 11.4–15.2)

## 2018-03-26 LAB — I-STAT TROPONIN, ED: TROPONIN I, POC: 0 ng/mL (ref 0.00–0.08)

## 2018-03-26 LAB — TROPONIN I

## 2018-03-26 MED ORDER — PANTOPRAZOLE SODIUM 40 MG PO TBEC
40.0000 mg | DELAYED_RELEASE_TABLET | Freq: Once | ORAL | Status: AC
  Administered 2018-03-26: 40 mg via ORAL
  Filled 2018-03-26: qty 1

## 2018-03-26 NOTE — ED Provider Notes (Signed)
[facility] EMERGENCY DEPARTMENT Provider Note   CSN: [id number] Arrival date & time: 03/26/18  0715     History   Chief Complaint Chief Complaint  Patient presents with  . Chest Pain    HPI Roy [name] is a 67 y.o. male.  Patient complains of left thigh discomfort in his chest after eating something last night.  No longer having the pain now.  The history is provided by the patient. No language interpreter was used.  Chest Pain   This is a new problem. The current episode started 6 to 12 hours ago. The problem occurs rarely. The problem has been resolved. The pain is associated with eating. The pain is present in the lateral region. The pain is at a severity of 3/10. The pain is mild. The quality of the pain is described as brief. The pain does not radiate. Pertinent negatives include no abdominal pain, no back pain, no cough and no headaches.  Pertinent negatives for past medical history include no seizures.    Past Medical History:  Diagnosis Date  . GERD (gastroesophageal reflux disease)   . Obesity     Patient Active Problem List   Diagnosis Date Noted  . Cellulitis 03/16/2018  . Obesity (BMI 30-39.9) 05/04/2015  . Chronic anticoagulation 05/04/2015  . Trifascicular block 04/29/2015  . LAFB (left anterior fascicular block) 04/29/2015  . Essential hypertension   . Hypokalemia 04/28/2015  . RBBB 04/28/2015  . First degree AV block 04/28/2015  . PAF (paroxysmal atrial fibrillation) ([place]) 04/27/2015  . Dizziness 04/27/2015    Past Surgical History:  Procedure Laterality Date  . APPENDECTOMY    . CHOLECYSTECTOMY    . ESOPHAGEAL DILATION     multiple times  . JOINT REPLACEMENT          Home Medications    Prior to Admission medications   Medication Sig Start Date End Date Taking? Authorizing Provider  amLODipine (NORVASC) 2.5 MG tablet Take 1 tablet (2.5 mg total) by mouth daily. 03/18/18  Yes Tat, Shanon Brow, MD  cephALEXin (KEFLEX) 500 MG capsule Take  1 capsule (500 mg total) by mouth 4 (four) times daily. 03/18/18  Yes Tat, Shanon Brow, MD  omeprazole (PRILOSEC OTC) 20 MG tablet Take 20 mg by mouth daily.   Yes [provider]  warfarin (COUMADIN) 5 MG tablet Take 1 tablet by mouth daily. 03/25/18  Yes [provider]    Family History Family History  Problem Relation Age of Onset  . Heart failure Father   . Heart attack Father        Deceased   . Thyroid disease Sister     Social History Social History   Tobacco Use  . Smoking status: Never Smoker  . Smokeless tobacco: Never Used  Substance Use Topics  . Alcohol use: No  . Drug use: No     Allergies   Adhesive [tape] and Sulfa antibiotics   Review of Systems Review of Systems  Constitutional: Negative for appetite change and fatigue.  HENT: Negative for congestion, ear discharge and sinus pressure.   Eyes: Negative for discharge.  Respiratory: Negative for cough.   Cardiovascular: Positive for chest pain.  Gastrointestinal: Negative for abdominal pain and diarrhea.  Genitourinary: Negative for frequency and hematuria.  Musculoskeletal: Negative for back pain.  Skin: Negative for rash.  Neurological: Negative for seizures and headaches.  Psychiatric/Behavioral: Negative for hallucinations.     Physical Exam Updated Vital Signs BP 132/83   Pulse 62  Temp 98.2 F (36.8 C) (Oral)   Resp 20   SpO2 94%   Physical Exam  Constitutional: He is oriented to person, place, and time. He appears well-developed.  HENT:  Head: Normocephalic.  Eyes: Conjunctivae and EOM are normal. No scleral icterus.  Neck: Neck supple. No thyromegaly present.  Cardiovascular: Normal rate and regular rhythm. Exam reveals no gallop and no friction rub.  No murmur heard. Pulmonary/Chest: No stridor. He has no wheezes. He has no rales. He exhibits no tenderness.  Abdominal: He exhibits no distension. There is no tenderness. There is no rebound.  Musculoskeletal: Normal  range of motion. He exhibits no edema.  Lymphadenopathy:    He has no cervical adenopathy.  Neurological: He is oriented to person, place, and time. He exhibits normal muscle tone. Coordination normal.  Skin: No rash noted. No erythema.  Psychiatric: He has a normal mood and affect. His behavior is normal.     ED Treatments / Results  Labs (all labs ordered are listed, but only abnormal results are displayed) Labs Reviewed  CBC WITH DIFFERENTIAL/PLATELET - Abnormal; Notable for the following components:      Result Value   WBC 12.1 (*)    RBC 6.37 (*)    Hemoglobin 18.4 (*)    HCT 55.6 (*)    Neutro Abs 8.5 (*)    All other components within normal limits  COMPREHENSIVE METABOLIC PANEL - Abnormal; Notable for the following components:   Glucose, Bld 123 (*)    Total Protein 8.7 (*)    Total Bilirubin 1.7 (*)    All other components within normal limits  PROTIME-INR  TROPONIN I  I-STAT TROPONIN, ED    EKG EKG Interpretation  Date/Time:  Wednesday March 26 2018 07:23:14 EST Ventricular Rate:  80 PR Interval:    QRS Duration: 152 QT Interval:  407 QTC Calculation: 470 R Axis:   127 Text Interpretation:  Atrial fibrillation Right bundle branch block Confirmed by Milton Ferguson [id number]) on 03/26/2018 7:28:34 AM   Radiology Dg Chest Portable 1 View  Result Date: 03/26/2018 CLINICAL DATA:  Chest pain EXAM: PORTABLE CHEST 1 VIEW COMPARISON:  April 27, 2015 FINDINGS: There is no appreciable edema or consolidation. There is cardiomegaly with pulmonary vascularity normal. No adenopathy. No bone lesions. No pneumothorax. IMPRESSION: Stable cardiac prominence.  No edema or consolidation. Electronically Signed   By: Lowella Grip III M.D.   On: 03/26/2018 07:57    Procedures Procedures (including critical care time)  Medications Ordered in ED Medications  pantoprazole (PROTONIX) EC tablet 40 mg (40 mg Oral Given 03/26/18 0839)     Initial Impression / Assessment  and Plan / ED Course  I have reviewed the triage vital signs and the nursing notes.  Pertinent labs & imaging results that were available during my care of the patient were reviewed by me and considered in my medical decision making (see chart for details).     EKG shows his right bundle branch block which is unchanged from 3 years ago.  Labs including 2 troponins are unremarkable.  Patient has had no discomfort in the emergency department.  We will increase his Prilosec to twice a day and he has an appointment with his doctor next week for recheck  Final Clinical Impressions(s) / ED Diagnoses   Final diagnoses:  Atypical chest pain    ED Discharge Orders    None       Milton Ferguson, MD 03/26/18 1154

## 2018-03-26 NOTE — ED Triage Notes (Signed)
Pt started having left sided chest pain at 1:30 am. States it is an aching pain that does not radiate. States he ate  peanut butter and ritz crackers last night and that may have something to do with it. States he does have acid reflux and is not sure if that is where the pain is coming from.

## 2018-03-26 NOTE — ED Notes (Signed)
EKG given to Dr. Zammit 

## 2018-03-26 NOTE — Discharge Instructions (Addendum)
Start taking your Prilosec twice a day and follow-up with your doctor Tuesday as planned.  Return if any problems

## 2018-04-01 DIAGNOSIS — Z7901 Long term (current) use of anticoagulants: Secondary | ICD-10-CM | POA: Diagnosis not present

## 2018-04-01 DIAGNOSIS — I4891 Unspecified atrial fibrillation: Secondary | ICD-10-CM | POA: Diagnosis not present

## 2018-04-08 DIAGNOSIS — I4891 Unspecified atrial fibrillation: Secondary | ICD-10-CM | POA: Diagnosis not present

## 2018-04-08 DIAGNOSIS — Z23 Encounter for immunization: Secondary | ICD-10-CM | POA: Diagnosis not present

## 2018-04-08 DIAGNOSIS — I10 Essential (primary) hypertension: Secondary | ICD-10-CM | POA: Diagnosis not present

## 2018-04-08 DIAGNOSIS — L0390 Cellulitis, unspecified: Secondary | ICD-10-CM | POA: Diagnosis not present

## 2018-04-08 DIAGNOSIS — K219 Gastro-esophageal reflux disease without esophagitis: Secondary | ICD-10-CM | POA: Diagnosis not present

## 2018-04-15 DIAGNOSIS — I4891 Unspecified atrial fibrillation: Secondary | ICD-10-CM | POA: Diagnosis not present

## 2018-04-15 DIAGNOSIS — I480 Paroxysmal atrial fibrillation: Secondary | ICD-10-CM | POA: Diagnosis not present

## 2018-04-15 DIAGNOSIS — Z7901 Long term (current) use of anticoagulants: Secondary | ICD-10-CM | POA: Diagnosis not present

## 2019-02-17 ENCOUNTER — Other Ambulatory Visit: Payer: Self-pay

## 2019-02-17 DIAGNOSIS — Z20822 Contact with and (suspected) exposure to covid-19: Secondary | ICD-10-CM

## 2019-02-19 LAB — NOVEL CORONAVIRUS, NAA: SARS-CoV-2, NAA: NOT DETECTED

## 2019-02-20 ENCOUNTER — Telehealth: Payer: Self-pay | Admitting: General Practice

## 2019-02-20 NOTE — Telephone Encounter (Signed)
Negative COVID results given. Patient results "NOT Detected." Caller expressed understanding. ° °

## 2019-04-29 ENCOUNTER — Other Ambulatory Visit: Payer: Self-pay

## 2019-04-29 ENCOUNTER — Emergency Department (HOSPITAL_COMMUNITY): Payer: Medicare Other

## 2019-04-29 ENCOUNTER — Encounter (HOSPITAL_COMMUNITY): Payer: Self-pay | Admitting: Emergency Medicine

## 2019-04-29 ENCOUNTER — Emergency Department (HOSPITAL_COMMUNITY)
Admission: EM | Admit: 2019-04-29 | Discharge: 2019-04-29 | Disposition: A | Discharge status: Home / Self Care | Payer: Medicare Other | Attending: Emergency Medicine | Admitting: Emergency Medicine

## 2019-04-29 DIAGNOSIS — Z7901 Long term (current) use of anticoagulants: Secondary | ICD-10-CM | POA: Diagnosis not present

## 2019-04-29 DIAGNOSIS — I480 Paroxysmal atrial fibrillation: Secondary | ICD-10-CM | POA: Insufficient documentation

## 2019-04-29 DIAGNOSIS — Z79899 Other long term (current) drug therapy: Secondary | ICD-10-CM | POA: Insufficient documentation

## 2019-04-29 DIAGNOSIS — I10 Essential (primary) hypertension: Secondary | ICD-10-CM | POA: Diagnosis not present

## 2019-04-29 DIAGNOSIS — K5792 Diverticulitis of intestine, part unspecified, without perforation or abscess without bleeding: Secondary | ICD-10-CM

## 2019-04-29 DIAGNOSIS — R1032 Left lower quadrant pain: Secondary | ICD-10-CM | POA: Diagnosis present

## 2019-04-29 LAB — URINALYSIS, ROUTINE W REFLEX MICROSCOPIC
Bilirubin Urine: NEGATIVE
Glucose, UA: NEGATIVE mg/dL
Ketones, ur: NEGATIVE mg/dL
Leukocytes,Ua: NEGATIVE
Nitrite: NEGATIVE
Protein, ur: 30 mg/dL — AB
Specific Gravity, Urine: 1.017 (ref 1.005–1.030)
pH: 6 (ref 5.0–8.0)

## 2019-04-29 LAB — CBC WITH DIFFERENTIAL/PLATELET
Abs Immature Granulocytes: 0.04 10*3/uL (ref 0.00–0.07)
Basophils Absolute: 0.1 10*3/uL (ref 0.0–0.1)
Basophils Relative: 1 %
Eosinophils Absolute: 0 10*3/uL (ref 0.0–0.5)
Eosinophils Relative: 0 %
HCT: 54.4 % — ABNORMAL HIGH (ref 39.0–52.0)
Hemoglobin: 18.2 g/dL — ABNORMAL HIGH (ref 13.0–17.0)
Immature Granulocytes: 0 %
Lymphocytes Relative: 18 %
Lymphs Abs: 2.3 10*3/uL (ref 0.7–4.0)
MCH: 28.7 pg (ref 26.0–34.0)
MCHC: 33.5 g/dL (ref 30.0–36.0)
MCV: 85.8 fL (ref 80.0–100.0)
Monocytes Absolute: 0.8 10*3/uL (ref 0.1–1.0)
Monocytes Relative: 6 %
Neutro Abs: 10 10*3/uL — ABNORMAL HIGH (ref 1.7–7.7)
Neutrophils Relative %: 75 %
Platelets: 301 10*3/uL (ref 150–400)
RBC: 6.34 MIL/uL — ABNORMAL HIGH (ref 4.22–5.81)
RDW: 14.6 % (ref 11.5–15.5)
WBC: 13.3 10*3/uL — ABNORMAL HIGH (ref 4.0–10.5)
nRBC: 0 % (ref 0.0–0.2)

## 2019-04-29 LAB — BASIC METABOLIC PANEL
Anion gap: 11 (ref 5–15)
BUN: 8 mg/dL (ref 8–23)
CO2: 31 mmol/L (ref 22–32)
Calcium: 9.3 mg/dL (ref 8.9–10.3)
Chloride: 96 mmol/L — ABNORMAL LOW (ref 98–111)
Creatinine, Ser: 0.75 mg/dL (ref 0.61–1.24)
GFR calc Af Amer: >60 mL/min (ref >60)
GFR calc non Af Amer: >60 mL/min (ref >60)
Glucose, Bld: 128 mg/dL — ABNORMAL HIGH (ref 70–99)
Potassium: 2.9 mmol/L — ABNORMAL LOW (ref 3.5–5.1)
Sodium: 138 mmol/L (ref 135–145)

## 2019-04-29 MED ORDER — MAGNESIUM SULFATE 2 GM/50ML IV SOLN
2.0000 g | INTRAVENOUS | Status: AC
  Administered 2019-04-29: 2 g via INTRAVENOUS
  Filled 2019-04-29: qty 50

## 2019-04-29 MED ORDER — POTASSIUM CHLORIDE 10 MEQ/100ML IV SOLN
10.0000 meq | Freq: Once | INTRAVENOUS | Status: AC
  Administered 2019-04-29: 12:00:00 10 meq via INTRAVENOUS
  Filled 2019-04-29: qty 100

## 2019-04-29 MED ORDER — IOHEXOL 300 MG/ML  SOLN
100.0000 mL | Freq: Once | INTRAMUSCULAR | Status: AC | PRN
  Administered 2019-04-29: 11:00:00 100 mL via INTRAVENOUS

## 2019-04-29 MED ORDER — AMOXICILLIN-POT CLAVULANATE 875-125 MG PO TABS
1.0000 | ORAL_TABLET | Freq: Once | ORAL | Status: AC
  Administered 2019-04-29: 12:00:00 1 via ORAL
  Filled 2019-04-29: qty 1

## 2019-04-29 MED ORDER — ONDANSETRON 4 MG PO TBDP: 4.0000 mg | ORAL_TABLET | Freq: Three times a day (TID) | ORAL | 0 refills | Status: DC | PRN

## 2019-04-29 MED ORDER — HYDROCODONE-ACETAMINOPHEN 5-325 MG PO TABS: 1.0000 | ORAL_TABLET | Freq: Four times a day (QID) | ORAL | 0 refills | Status: AC | PRN

## 2019-04-29 MED ORDER — AMOXICILLIN-POT CLAVULANATE 875-125 MG PO TABS: 1.0000 | ORAL_TABLET | Freq: Two times a day (BID) | ORAL | 0 refills | Status: AC

## 2019-04-29 MED ORDER — SODIUM CHLORIDE 0.9 % IV SOLN: INTRAVENOUS | Status: DC

## 2019-04-29 NOTE — ED Provider Notes (Signed)
[facility] EMERGENCY DEPARTMENT Provider Note   CSN: [id number] Arrival date & time: 04/29/19  [id number]     History No chief complaint on file.   Roy [name] is a 68 y.o. male.  HPI   This patient is a 68 year old male, prior history of cholecystectomy, appendectomy and paroxysmal atrial fibrillation for which he takes Coumadin.  He presents to the hospital after developing gradual onset of left lower quadrant discomfort which started yesterday morning and has been intermittent since that time until last night when it became more persistent.  He had a difficult time resting and sleeping last night because of the constant left lower quadrant pain which radiates down into the groin and the pelvis.  There is no associated diarrhea, he does have some chronic constipation for which he uses a stool softener but has had no blood in the stool.  He denies any dysuria, frequency or hematuria and states this does not feel similar to kidney stones which she had many years ago.  The patient denies having any hernias in the past, had a colonoscopy in the past, does not think he had diverticulosis  I have reviewed the medical history, there is no signs of gastrointestinal evaluations on the medical record, however a CT scan performed in 2011 showed that the patient did have sigmoid diverticulosis.    Past Medical History:  Diagnosis Date  . GERD (gastroesophageal reflux disease)   . Obesity     Patient Active Problem List   Diagnosis Date Noted  . Cellulitis 03/16/2018  . Obesity (BMI 30-39.9) 05/04/2015  . Chronic anticoagulation 05/04/2015  . Trifascicular block 04/29/2015  . LAFB (left anterior fascicular block) 04/29/2015  . Essential hypertension   . Hypokalemia 04/28/2015  . RBBB 04/28/2015  . First degree AV block 04/28/2015  . PAF (paroxysmal atrial fibrillation) ([place]) 04/27/2015  . Dizziness 04/27/2015    Past Surgical History:  Procedure Laterality Date  . APPENDECTOMY    .  CHOLECYSTECTOMY    . ESOPHAGEAL DILATION     multiple times  . JOINT REPLACEMENT         Family History  Problem Relation Age of Onset  . Heart failure Father   . Heart attack Father        Deceased   . Thyroid disease Sister     Social History   Tobacco Use  . Smoking status: Never Smoker  . Smokeless tobacco: Never Used  Substance Use Topics  . Alcohol use: No  . Drug use: No    Home Medications Prior to Admission medications   Medication Sig Start Date End Date Taking? Authorizing Provider  amLODipine (NORVASC) 2.5 MG tablet Take 1 tablet (2.5 mg total) by mouth daily. 03/18/18  Yes Tat, Shanon Brow, MD  omeprazole (PRILOSEC OTC) 20 MG tablet Take 20 mg by mouth daily.   Yes [provider]  warfarin (COUMADIN) 5 MG tablet Take 1 tablet by mouth daily. 03/25/18  Yes [provider]  amoxicillin-clavulanate (AUGMENTIN) 875-125 MG tablet Take 1 tablet by mouth every 12 (twelve) hours for 10 days. 04/29/19 05/09/19  Noemi Chapel, MD  HYDROcodone-acetaminophen (NORCO/VICODIN) 5-325 MG tablet Take 1 tablet by mouth every 6 (six) hours as needed for up to 3 days. 04/29/19 05/02/19  Noemi Chapel, MD  ondansetron (ZOFRAN ODT) 4 MG disintegrating tablet Take 1 tablet (4 mg total) by mouth every 8 (eight) hours as needed for nausea. 04/29/19   Noemi Chapel, MD    Allergies  Adhesive [tape] and Sulfa antibiotics  Review of Systems   Review of Systems  Constitutional: Negative for chills and fever.  Respiratory: Negative for cough and shortness of breath.   Gastrointestinal: Positive for abdominal pain. Negative for nausea and vomiting.  Genitourinary: Negative for difficulty urinating, dysuria, flank pain, hematuria, penile swelling, scrotal swelling and testicular pain.  Musculoskeletal: Negative for joint swelling.  All other systems reviewed and are negative.   Physical Exam Updated Vital Signs BP (!) 143/84 (BP Location: Right Arm)   Pulse 74   Temp  98.6 F (37 C) (Oral)   Resp 18   Ht 1.829 m (6')   Wt 112.5 kg   SpO2 98%   BMI 33.63 kg/m   Physical Exam Vitals and nursing note reviewed.  Constitutional:      General: He is not in acute distress.    Appearance: He is well-developed.  HENT:     Head: Normocephalic and atraumatic.     Mouth/Throat:     Pharynx: No oropharyngeal exudate.  Eyes:     General: No scleral icterus.       Right eye: No discharge.        Left eye: No discharge.     Conjunctiva/sclera: Conjunctivae normal.     Pupils: Pupils are equal, round, and reactive to light.  Neck:     Thyroid: No thyromegaly.     Vascular: No JVD.  Cardiovascular:     Rate and Rhythm: Normal rate and regular rhythm.     Heart sounds: Normal heart sounds. No murmur. No friction rub. No gallop.      Comments: At this time the patient's heart rate is regular, normal pulses, no edema Pulmonary:     Effort: Pulmonary effort is normal. No respiratory distress.     Breath sounds: Normal breath sounds. No wheezing or rales.  Abdominal:     General: Bowel sounds are normal. There is no distension.     Palpations: Abdomen is soft. There is no mass.     Tenderness: There is abdominal tenderness.     Comments: The abdomen is very soft, he is obese, he has tenderness in the left mid and lower quadrants, no CVA tenderness  Musculoskeletal:        General: No tenderness. Normal range of motion.     Cervical back: Normal range of motion and neck supple.  Lymphadenopathy:     Cervical: No cervical adenopathy.  Skin:    General: Skin is warm and dry.     Findings: No erythema or rash.  Neurological:     Mental Status: He is alert.     Coordination: Coordination normal.  Psychiatric:        Behavior: Behavior normal.     ED Results / Procedures / Treatments   Labs (all labs ordered are listed, but only abnormal results are displayed) Labs Reviewed  CBC WITH DIFFERENTIAL/PLATELET - Abnormal; Notable for the following  components:      Result Value   WBC 13.3 (*)    RBC 6.34 (*)    Hemoglobin 18.2 (*)    HCT 54.4 (*)    Neutro Abs 10.0 (*)    All other components within normal limits  BASIC METABOLIC PANEL - Abnormal; Notable for the following components:   Potassium 2.9 (*)    Chloride 96 (*)    Glucose, Bld 128 (*)    All other components within normal limits  URINALYSIS, ROUTINE W REFLEX MICROSCOPIC - Abnormal;  Notable for the following components:   Color, Urine AMBER (*)    APPearance HAZY (*)    Hgb urine dipstick SMALL (*)    Protein, ur 30 (*)    Bacteria, UA MANY (*)    All other components within normal limits  URINE CULTURE    EKG None  Radiology CT ABDOMEN PELVIS W CONTRAST  Result Date: 04/29/2019 CLINICAL DATA:  Left lower abdominal pain radiating to the groin for 2 days. Concern for diverticulitis. EXAM: CT ABDOMEN AND PELVIS WITH CONTRAST TECHNIQUE: Multidetector CT imaging of the abdomen and pelvis was performed using the standard protocol following bolus administration of intravenous contrast. CONTRAST:  18mL OMNIPAQUE IOHEXOL 300 MG/ML  SOLN COMPARISON:  07/22/2009 FINDINGS: Lower chest: Mild motion artifact through the lung bases without lung consolidation or pleural effusion. Hepatobiliary: No focal liver abnormality is seen. Status post cholecystectomy. No biliary dilatation. Pancreas: Unremarkable. Spleen: Unremarkable. Adrenals/Urinary Tract: Unremarkable adrenal glands. 1.5 cm cyst in the upper pole of the right kidney. Subcentimeter hypodensity in the medial upper pole of the left kidney, too small to fully characterize. No renal calculi or hydronephrosis. Largely collapsed bladder. Stomach/Bowel: The stomach is nondistended. There is no evidence of bowel obstruction. Sigmoid colon diverticulosis is noted with new prominent wall thickening proximally and moderate surrounding inflammatory changes. No extraluminal gas or fluid collection is identified. Status post  appendectomy. Vascular/Lymphatic: Aortic atherosclerosis without aneurysm. No enlarged lymph nodes. Reproductive: Enlarged prostate indenting the bladder base. Other: Small fat containing right inguinal hernia. Right-sided abdominal wall muscular fatty atrophy with abdominal wall laxity. No intraperitoneal free fluid. Musculoskeletal: Left total hip arthroplasty. Bilateral SI joint ankylosis. Interbody and posterior element ankylosis in the lumbar and included lower thoracic spine. Severe medial left neural foraminal stenosis at L4-5 due to facet and endplate spurring and disc bulging. IMPRESSION: 1. Acute sigmoid diverticulitis. No evidence of perforation or abscess. 2. Findings of ankylosing spondylitis. 3.  Aortic Atherosclerosis (ICD10-I70.0). Electronically Signed   By: Logan Bores M.D.   On: 04/29/2019 11:51    Procedures Procedures (including critical care time)  Medications Ordered in ED Medications  potassium chloride 10 mEq in 100 mL IVPB (has no administration in time range)  magnesium sulfate IVPB 2 g 50 mL (has no administration in time range)  0.9 %  sodium chloride infusion (has no administration in time range)  amoxicillin-clavulanate (AUGMENTIN) 875-125 MG per tablet 1 tablet (has no administration in time range)  iohexol (OMNIPAQUE) 300 MG/ML solution 100 mL (100 mLs Intravenous Contrast Given 04/29/19 1128)    ED Course  I have reviewed the triage vital signs and the nursing notes.  Pertinent labs & imaging results that were available during my care of the patient were reviewed by me and considered in my medical decision making (see chart for details).  Clinical Course as of Apr 28 1158  Wed Apr 29, 2019  1044 Urinalysis shows many bacteria with 6-10 white blood cells, 6-10 red blood cells, nitrite negative, leukocyte negative, possible contaminant, proceed with CT scan   [BM]  1114 Potassium is 2.9, CBC shows a leukocytosis of 13,300 with a relative elevation in  hemoglobin at 18 consistent with a likely dehydrated or contracted fluid state.  IV fluids and potassium will be given   [BM]  1152 I have personally viewed and interpreted the CT scan, I believe there is acute diverticulitis of the sigmoid colon.  Antibiotics started, the patient still has stable vital signs   [BM]  Clinical Course User Index [BM] Noemi Chapel, MD   MDM Rules/Calculators/A&P                      This patient is currently asymptomatic with left lower quadrant discomfort, known history of diverticulosis based on prior CT, will need CT scan of the abdomen and pelvis with contrast to look for signs of diverticulitis, he is not febrile or tachycardic, hoping that there is no signs of abscess or perforation, the patient is agreeable to the plan.  Could possibly be nephrolithiasis though this constant reproducible pain seems less likely to be of ureteral origin  Final Clinical Impression(s) / ED Diagnoses Final diagnoses:  Acute diverticulitis of intestine    Rx / DC Orders ED Discharge Orders         Ordered    amoxicillin-clavulanate (AUGMENTIN) 875-125 MG tablet  Every 12 hours     04/29/19 1159    HYDROcodone-acetaminophen (NORCO/VICODIN) 5-325 MG tablet  Every 6 hours PRN     04/29/19 1159    ondansetron (ZOFRAN ODT) 4 MG disintegrating tablet  Every 8 hours PRN     04/29/19 1159           Noemi Chapel, MD 04/29/19 1200

## 2019-04-29 NOTE — Discharge Instructions (Signed)
Your CT scan confirms that you do in fact have diverticulitis.  Please read the attached instructions and take the medicine called Augmentin twice a day for the next 10 days.  You may take hydrocodone only for severe pain, 1 tablet every 6 hours  Please take Zofran as needed for nausea  Drink plenty of fluids but return to the emergency department for uncontrolled pain or progressive nausea vomiting pain or fever.  Follow-up with your doctor in 3 days for recheck.

## 2019-04-29 NOTE — ED Triage Notes (Signed)
Patient c/o of lower left ABD pain radiating down to groin area. denies N/V/D

## 2019-05-01 LAB — URINE CULTURE: Culture: 20000 — AB

## 2019-05-03 ENCOUNTER — Telehealth: Payer: Self-pay | Admitting: Emergency Medicine

## 2019-05-03 NOTE — Telephone Encounter (Signed)
Post ED Visit - Positive Culture Follow-up  Culture report reviewed by antimicrobial stewardship pharmacist: [place] Team []  Elenor Quinones, Pharm.D. []  Heide Guile, Pharm.D., BCPS AQ-ID []  Parks Neptune, Pharm.D., BCPS []  Alycia Rossetti, Pharm.D., BCPS []  [place], Pharm.D., BCPS, AAHIVP []  Legrand Como, Pharm.D., BCPS, AAHIVP []  Salome Arnt, PharmD, BCPS []  Johnnette Gourd, PharmD, BCPS []  Hughes Better, PharmD, BCPS []  Leeroy Cha, PharmD []  Laqueta Linden, PharmD, BCPS []  Albertina Parr, PharmD  [place] Team []  Leodis Sias, PharmD []  Lindell Spar, PharmD []  Royetta Asal, PharmD []  Graylin Shiver, Rph []  Rema Fendt) Glennon Mac, PharmD []  Arlyn Dunning, PharmD []  Netta Cedars, PharmD []  Dia Sitter, PharmD []  Leone Haven, PharmD []  Gretta Arab, PharmD []  Theodis Shove, PharmD []  Peggyann Juba, PharmD []  Reuel Boom, PharmD   Positive urine culture Treated with amoxicillin-pot clavulante, organism sensitive to the same and no further patient follow-up is required at this time.  Hazle Nordmann 05/03/2019, 9:59 AM

## 2019-05-03 NOTE — Telephone Encounter (Signed)
Post ED Visit - Positive Culture Follow-up  Culture report reviewed by antimicrobial stewardship pharmacist: [place] Team []  Elenor Quinones, Pharm.D. []  Heide Guile, Pharm.D., BCPS AQ-ID []  Parks Neptune, Pharm.D., BCPS []  Alycia Rossetti, Pharm.D., BCPS []  [place], Pharm.D., BCPS, AAHIVP []  Legrand Como, Pharm.D., BCPS, AAHIVP []  Salome Arnt, PharmD, BCPS []  Johnnette Gourd, PharmD, BCPS []  Hughes Better, PharmD, BCPS []  Leeroy Cha, PharmD []  Laqueta Linden, PharmD, BCPS []  Albertina Parr, PharmD  [place] Team []  Leodis Sias, PharmD []  Lindell Spar, PharmD []  Royetta Asal, PharmD []  Graylin Shiver, Rph []  Rema Fendt) Glennon Mac, PharmD []  Arlyn Dunning, PharmD []  Netta Cedars, PharmD []  Dia Sitter, PharmD []  Leone Haven, PharmD []  Gretta Arab, PharmD []  Theodis Shove, PharmD []  Peggyann Juba, PharmD []  Reuel Boom, PharmD   Positive urine culture Treated with amoxicillin-pot clavulanate, organism sensitive to the same and no further patient follow-up is required at this time.  Hazle Nordmann 05/03/2019, 10:19 AM

## 2019-12-21 ENCOUNTER — Ambulatory Visit (INDEPENDENT_AMBULATORY_CARE_PROVIDER_SITE_OTHER): Payer: Medicare Other | Admitting: Gastroenterology

## 2019-12-31 ENCOUNTER — Ambulatory Visit (INDEPENDENT_AMBULATORY_CARE_PROVIDER_SITE_OTHER): Payer: Medicare Other | Admitting: Gastroenterology

## 2020-02-19 LAB — COLOGUARD

## 2020-05-07 HISTORY — PX: CATARACT EXTRACTION: SUR2

## 2020-10-21 ENCOUNTER — Other Ambulatory Visit: Payer: Self-pay

## 2020-10-21 ENCOUNTER — Emergency Department (HOSPITAL_COMMUNITY): Payer: Medicare Other

## 2020-10-21 ENCOUNTER — Encounter (HOSPITAL_COMMUNITY): Payer: Self-pay | Admitting: *Deleted

## 2020-10-21 ENCOUNTER — Inpatient Hospital Stay (HOSPITAL_COMMUNITY)
Admission: EM | Admit: 2020-10-21 | Discharge: 2020-10-24 | DRG: 641 | Disposition: A | Discharge status: Home / Self Care | Payer: Medicare Other | Attending: Internal Medicine | Admitting: Internal Medicine

## 2020-10-21 DIAGNOSIS — K219 Gastro-esophageal reflux disease without esophagitis: Secondary | ICD-10-CM | POA: Diagnosis present

## 2020-10-21 DIAGNOSIS — Z9109 Other allergy status, other than to drugs and biological substances: Secondary | ICD-10-CM

## 2020-10-21 DIAGNOSIS — R3912 Poor urinary stream: Secondary | ICD-10-CM | POA: Diagnosis present

## 2020-10-21 DIAGNOSIS — D689 Coagulation defect, unspecified: Secondary | ICD-10-CM

## 2020-10-21 DIAGNOSIS — Z882 Allergy status to sulfonamides status: Secondary | ICD-10-CM

## 2020-10-21 DIAGNOSIS — I10 Essential (primary) hypertension: Secondary | ICD-10-CM | POA: Diagnosis present

## 2020-10-21 DIAGNOSIS — E876 Hypokalemia: Principal | ICD-10-CM | POA: Diagnosis present

## 2020-10-21 DIAGNOSIS — Z20822 Contact with and (suspected) exposure to covid-19: Secondary | ICD-10-CM | POA: Diagnosis present

## 2020-10-21 DIAGNOSIS — I4891 Unspecified atrial fibrillation: Secondary | ICD-10-CM | POA: Diagnosis present

## 2020-10-21 DIAGNOSIS — E669 Obesity, unspecified: Secondary | ICD-10-CM | POA: Diagnosis present

## 2020-10-21 DIAGNOSIS — I480 Paroxysmal atrial fibrillation: Secondary | ICD-10-CM | POA: Diagnosis present

## 2020-10-21 DIAGNOSIS — R531 Weakness: Secondary | ICD-10-CM | POA: Diagnosis not present

## 2020-10-21 DIAGNOSIS — Z8249 Family history of ischemic heart disease and other diseases of the circulatory system: Secondary | ICD-10-CM

## 2020-10-21 DIAGNOSIS — E7800 Pure hypercholesterolemia, unspecified: Secondary | ICD-10-CM | POA: Diagnosis present

## 2020-10-21 DIAGNOSIS — E785 Hyperlipidemia, unspecified: Secondary | ICD-10-CM | POA: Diagnosis present

## 2020-10-21 DIAGNOSIS — Z7901 Long term (current) use of anticoagulants: Secondary | ICD-10-CM

## 2020-10-21 DIAGNOSIS — I4510 Unspecified right bundle-branch block: Secondary | ICD-10-CM | POA: Diagnosis present

## 2020-10-21 DIAGNOSIS — Z79899 Other long term (current) drug therapy: Secondary | ICD-10-CM

## 2020-10-21 DIAGNOSIS — Z9049 Acquired absence of other specified parts of digestive tract: Secondary | ICD-10-CM

## 2020-10-21 DIAGNOSIS — E0590 Thyrotoxicosis, unspecified without thyrotoxic crisis or storm: Secondary | ICD-10-CM

## 2020-10-21 DIAGNOSIS — Z8349 Family history of other endocrine, nutritional and metabolic diseases: Secondary | ICD-10-CM

## 2020-10-21 DIAGNOSIS — Z6836 Body mass index (BMI) 36.0-36.9, adult: Secondary | ICD-10-CM

## 2020-10-21 HISTORY — DX: Essential (primary) hypertension: I10

## 2020-10-21 HISTORY — DX: Pure hypercholesterolemia, unspecified: E78.00

## 2020-10-21 HISTORY — DX: Unspecified atrial fibrillation: I48.91

## 2020-10-21 LAB — URIC ACID: Uric Acid, Serum: 11.8 mg/dL — ABNORMAL HIGH (ref 3.7–8.6)

## 2020-10-21 LAB — PROTIME-INR
INR: 5.1 (ref 0.8–1.2)
Prothrombin Time: 47 seconds — ABNORMAL HIGH (ref 11.4–15.2)

## 2020-10-21 LAB — URINALYSIS, COMPLETE (UACMP) WITH MICROSCOPIC
Bacteria, UA: NONE SEEN
Bilirubin Urine: NEGATIVE
Glucose, UA: NEGATIVE mg/dL
Hgb urine dipstick: NEGATIVE
Ketones, ur: NEGATIVE mg/dL
Leukocytes,Ua: NEGATIVE
Nitrite: NEGATIVE
Protein, ur: NEGATIVE mg/dL
Specific Gravity, Urine: <1.005 — ABNORMAL LOW (ref 1.005–1.030)
pH: 7 (ref 5.0–8.0)

## 2020-10-21 LAB — CBC WITH DIFFERENTIAL/PLATELET
Basophils Absolute: 0.1 10*3/uL (ref 0.0–0.1)
Basophils Relative: 1 %
Eosinophils Absolute: 0.2 10*3/uL (ref 0.0–0.5)
Eosinophils Relative: 3 %
HCT: 47.3 % (ref 39.0–52.0)
Hemoglobin: 17.3 g/dL — ABNORMAL HIGH (ref 13.0–17.0)
Lymphocytes Relative: 51 %
Lymphs Abs: 2.6 10*3/uL (ref 0.7–4.0)
MCH: 31.6 pg (ref 26.0–34.0)
MCHC: 36.6 g/dL — ABNORMAL HIGH (ref 30.0–36.0)
MCV: 86.5 fL (ref 80.0–100.0)
Monocytes Absolute: 0.3 10*3/uL (ref 0.1–1.0)
Monocytes Relative: 5 %
Neutro Abs: 2 10*3/uL (ref 1.7–7.7)
Neutrophils Relative %: 40 %
Platelets: 216 10*3/uL (ref 150–400)
RBC: 5.47 MIL/uL (ref 4.22–5.81)
RDW: 14.6 % (ref 11.5–15.5)
WBC: 5 10*3/uL (ref 4.0–10.5)
nRBC: 0 % (ref 0.0–0.2)

## 2020-10-21 LAB — TROPONIN I (HIGH SENSITIVITY)
Troponin I (High Sensitivity): 23 ng/L — ABNORMAL HIGH (ref <18)
Troponin I (High Sensitivity): 24 ng/L — ABNORMAL HIGH (ref <18)

## 2020-10-21 LAB — MAGNESIUM
Magnesium: 1.6 mg/dL — ABNORMAL LOW (ref 1.7–2.4)
Magnesium: 1.7 mg/dL (ref 1.7–2.4)

## 2020-10-21 LAB — VITAMIN B12: Vitamin B-12: 231 pg/mL (ref 180–914)

## 2020-10-21 LAB — COMPREHENSIVE METABOLIC PANEL
ALT: 19 U/L (ref 0–44)
AST: 66 U/L — ABNORMAL HIGH (ref 15–41)
Albumin: 3.6 g/dL (ref 3.5–5.0)
Alkaline Phosphatase: 57 U/L (ref 38–126)
Anion gap: 11 (ref 5–15)
BUN: 15 mg/dL (ref 8–23)
CO2: 31 mmol/L (ref 22–32)
Calcium: 8.9 mg/dL (ref 8.9–10.3)
Chloride: 96 mmol/L — ABNORMAL LOW (ref 98–111)
Creatinine, Ser: 0.78 mg/dL (ref 0.61–1.24)
GFR, Estimated: >60 mL/min (ref >60)
Glucose, Bld: 106 mg/dL — ABNORMAL HIGH (ref 70–99)
Potassium: 2.3 mmol/L — CL (ref 3.5–5.1)
Sodium: 138 mmol/L (ref 135–145)
Total Bilirubin: 3.2 mg/dL — ABNORMAL HIGH (ref 0.3–1.2)
Total Protein: 6.9 g/dL (ref 6.5–8.1)

## 2020-10-21 LAB — T4, FREE: Free T4: 1.75 ng/dL — ABNORMAL HIGH (ref 0.61–1.12)

## 2020-10-21 LAB — BILIRUBIN, FRACTIONATED(TOT/DIR/INDIR)
Bilirubin, Direct: 0.5 mg/dL — ABNORMAL HIGH (ref 0.0–0.2)
Indirect Bilirubin: 2.3 mg/dL — ABNORMAL HIGH (ref 0.3–0.9)
Total Bilirubin: 2.8 mg/dL — ABNORMAL HIGH (ref 0.3–1.2)

## 2020-10-21 LAB — LIPASE, BLOOD: Lipase: 26 U/L (ref 11–51)

## 2020-10-21 LAB — RESP PANEL BY RT-PCR (FLU A&B, COVID) ARPGX2
Influenza A by PCR: NEGATIVE
Influenza B by PCR: NEGATIVE
SARS Coronavirus 2 by RT PCR: NEGATIVE

## 2020-10-21 LAB — TSH: TSH: 0.03 u[IU]/mL — ABNORMAL LOW (ref 0.350–4.500)

## 2020-10-21 LAB — FOLATE: Folate: 8.3 ng/mL (ref >5.9)

## 2020-10-21 LAB — LACTATE DEHYDROGENASE: LDH: 475 U/L — ABNORMAL HIGH (ref 98–192)

## 2020-10-21 MED ORDER — PANTOPRAZOLE SODIUM 40 MG PO TBEC
40.0000 mg | DELAYED_RELEASE_TABLET | Freq: Every day | ORAL | Status: DC
  Administered 2020-10-22 – 2020-10-24 (×3): 40 mg via ORAL
  Filled 2020-10-21 (×4): qty 1

## 2020-10-21 MED ORDER — ROSUVASTATIN CALCIUM 10 MG PO TABS
5.0000 mg | ORAL_TABLET | Freq: Every day | ORAL | Status: DC
  Administered 2020-10-21 – 2020-10-23 (×3): 5 mg via ORAL
  Filled 2020-10-21 (×3): qty 1

## 2020-10-21 MED ORDER — ONDANSETRON HCL 4 MG PO TABS: 4.0000 mg | ORAL_TABLET | Freq: Four times a day (QID) | ORAL | Status: DC | PRN

## 2020-10-21 MED ORDER — POTASSIUM CHLORIDE 10 MEQ/100ML IV SOLN
INTRAVENOUS | Status: AC
  Administered 2020-10-21: 10 meq
  Filled 2020-10-21: qty 100

## 2020-10-21 MED ORDER — POTASSIUM CHLORIDE 10 MEQ/100ML IV SOLN
10.0000 meq | INTRAVENOUS | Status: AC
  Administered 2020-10-21 (×2): 10 meq via INTRAVENOUS
  Filled 2020-10-21 (×2): qty 100

## 2020-10-21 MED ORDER — SODIUM CHLORIDE 0.9 % IV BOLUS
1000.0000 mL | Freq: Once | INTRAVENOUS | Status: AC
  Administered 2020-10-21: 1000 mL via INTRAVENOUS

## 2020-10-21 MED ORDER — MAGNESIUM SULFATE 2 GM/50ML IV SOLN
2.0000 g | Freq: Once | INTRAVENOUS | Status: AC
  Administered 2020-10-21: 2 g via INTRAVENOUS
  Filled 2020-10-21: qty 50

## 2020-10-21 MED ORDER — ACETAMINOPHEN 650 MG RE SUPP: 650.0000 mg | Freq: Four times a day (QID) | RECTAL | Status: DC | PRN

## 2020-10-21 MED ORDER — ACETAMINOPHEN 325 MG PO TABS: 650.0000 mg | ORAL_TABLET | Freq: Four times a day (QID) | ORAL | Status: DC | PRN

## 2020-10-21 MED ORDER — IOHEXOL 300 MG/ML  SOLN
100.0000 mL | Freq: Once | INTRAMUSCULAR | Status: AC | PRN
  Administered 2020-10-21: 100 mL via INTRAVENOUS

## 2020-10-21 MED ORDER — POTASSIUM CHLORIDE CRYS ER 20 MEQ PO TBCR
40.0000 meq | EXTENDED_RELEASE_TABLET | Freq: Once | ORAL | Status: AC
  Administered 2020-10-21: 40 meq via ORAL
  Filled 2020-10-21: qty 2

## 2020-10-21 MED ORDER — ONDANSETRON HCL 4 MG/2ML IJ SOLN: 4.0000 mg | Freq: Four times a day (QID) | INTRAMUSCULAR | Status: DC | PRN

## 2020-10-21 NOTE — ED Notes (Signed)
Attempted to give report to [place].

## 2020-10-21 NOTE — ED Notes (Signed)
Date and time results received: 10/21/20 &  1108  Test: INR  Critical Value: 5.1  Name of Provider Notified: EDP  Orders Received? Or Actions Taken?: Notified

## 2020-10-21 NOTE — ED Triage Notes (Signed)
Pt c/o generalized weakness, intermittent headache, weak urine stream. Pt reports he went to the beach last week and feels dehydrated. He was outside in the heat most days for several hours and did a lot of walking and feels he probably "wore myself out". Denies SOB, cough, fever. He reports he hasn't felt any better since coming back from the beach 1 week ago.

## 2020-10-21 NOTE — Progress Notes (Signed)
ANTICOAGULATION CONSULT NOTE - Initial Consult  Pharmacy Consult for Coumadin Indication: atrial fibrillation  Allergies  Allergen Reactions   Adhesive [Tape]    Sulfa Antibiotics     UNKNOWN Childhood reaction    Patient Measurements: Height: 6' (182.9 cm) Weight: 122.9 kg (271 lb) IBW/kg (Calculated) : 77.6  Vital Signs: Temp: 98.2 F (36.8 C) (06/17 0918) Temp Source: Oral (06/17 0918) BP: 131/68 (06/17 1200) Pulse Rate: 53 (06/17 1200)  Labs: Recent Labs    10/21/20 0928  HGB 17.3*  HCT 47.3  PLT 216  LABPROT 47.0*  INR 5.1*  CREATININE 0.78  TROPONINIHS 23*    Estimated Creatinine Clearance: 116.3 mL/min (by C-G formula based on SCr of 0.78 mg/dL).   Medical History: Past Medical History:  Diagnosis Date   Atrial fibrillation (HCC)    GERD (gastroesophageal reflux disease)    High cholesterol    Hypertension    Obesity     Medications:  See med rec  Assessment: 69 y.o. male with medical history of paroxysmal atrial fibrillation, right bundle branch block, GERD, hypertension, hyperlipidemia presenting with 1 week history of generalized weakness, decreased oral intake. No bleeding noted but INR is elevated at 5.1  Per patient, he takes 5mg  on T, TH, Sat                                   7.5mg  on M,W,F,Sun Goal of Therapy:  INR 2-3 Monitor platelets by anticoagulation protocol: Yes   Plan:  No coumadin today PT-INR daily Monitor for S/S of bleeding   Isac Sarna, BS [place]ena [place], BCPS Clinical Pharmacist Pager [phone]  10/21/2020,12:54 PM

## 2020-10-21 NOTE — ED Notes (Signed)
All 3 bags of potassium given to pt.

## 2020-10-21 NOTE — Evaluation (Signed)
Physical Therapy Evaluation Patient Details Name: Roy [name] MRN: [id number] DOB: 03-09-1951 Today's Date: 10/21/2020   History of Present Illness  Roy [name] is a 70 y.o. male with medical history of paroxysmal atrial fibrillation, right bundle branch block, GERD, hypertension, hyperlipidemia presenting with 1 week history of generalized weakness, decreased oral intake.  The patient denies any fevers, chills, headache, chest pain, shortness of breath, palpitations, nausea, vomiting, diarrhea.  He has had some intermittent left lower quadrant pain that is worsened with certain movements.  He denies any hematochezia, melena, hemoptysis, hematuria, dysuria.  He has had a weak urinary stream.  He has had some dizziness but denies any syncope.  He states that he recently started HCTZ on 09/20/2020.  At that time, his amlodipine was discontinued.  He has had no other medication changes.  He has had decreased oral intake and decreased appetite.   Clinical Impression  Patient functioning near baseline for functional mobility and gait. Patient with labored transition to seated EOB. He demonstrates good sitting balance and sitting tolerance. Patient minimally unsteady with initial transfer to standing without AD. He ambulates with slightly unsteady, labored cadence without AD or loss of balance. Patient returned to bed at end of session. Patient discharged to care of nursing for ambulation daily as tolerated for length of stay.     Follow Up Recommendations No PT follow up    Equipment Recommendations  None recommended by PT    Recommendations for Other Services       Precautions / Restrictions Precautions Precautions: Fall Restrictions Weight Bearing Restrictions: No      Mobility  Bed Mobility Overal bed mobility: Modified Independent             General bed mobility comments: labored transition to seated EOB    Transfers Overall transfer level: Modified  independent Equipment used: None             General transfer comment: transfer to standing without AD, minimal unsteadiness upon standing  Ambulation/Gait Ambulation/Gait assistance: Min guard Gait Distance (Feet): 100 Feet Assistive device: None Gait Pattern/deviations: Decreased step length - right;Decreased step length - left;Decreased stride length;Trunk flexed     General Gait Details: slighlty unsteady cadence without AD without loss of balance  Stairs            Wheelchair Mobility    Modified Rankin (Stroke Patients Only)       Balance Overall balance assessment: Needs assistance Sitting-balance support: No upper extremity supported Sitting balance-Leahy Scale: Good Sitting balance - Comments: seated EoB   Standing balance support: No upper extremity supported Standing balance-Leahy Scale: Good Standing balance comment: fair/good without AD                             Pertinent Vitals/Pain Pain Assessment: No/denies pain    Home Living Family/patient expects to be discharged to:: Private residence Living Arrangements: Alone Available Help at Discharge: Family Type of Home: Mobile home Home Access: Stairs to enter Entrance Stairs-Rails: Right;Left;Can reach both Entrance Stairs-Number of Steps: 3-4 Home Layout: One level Home Equipment: Walker - 2 wheels;Cane - single point      Prior Function Level of Independence: Independent         Comments: community ambulator, independent with ADL     Hand Dominance        Extremity/Trunk Assessment   Upper Extremity Assessment Upper Extremity Assessment: Overall WFL for tasks assessed  Lower Extremity Assessment Lower Extremity Assessment: Overall WFL for tasks assessed    Cervical / Trunk Assessment Cervical / Trunk Assessment: Kyphotic  Communication   Communication: No difficulties  Cognition Arousal/Alertness: Awake/alert Behavior During Therapy: WFL for tasks  assessed/performed Overall Cognitive Status: Within Functional Limits for tasks assessed                                        General Comments      Exercises     Assessment/Plan    PT Assessment Patent does not need any further PT services  PT Problem List         PT Treatment Interventions      PT Goals (Current goals can be found in the Care Plan section)  Acute Rehab PT Goals Patient Stated Goal: Return home PT Goal Formulation: With patient Time For Goal Achievement: 10/21/20 Potential to Achieve Goals: Good    Frequency     Barriers to discharge        Co-evaluation               AM-PAC PT "6 Clicks" Mobility  Outcome Measure Help needed turning from your back to your side while in a flat bed without using bedrails?: None Help needed moving from lying on your back to sitting on the side of a flat bed without using bedrails?: None Help needed moving to and from a bed to a chair (including a wheelchair)?: None Help needed standing up from a chair using your arms (e.g., wheelchair or bedside chair)?: None Help needed to walk in hospital room?: None Help needed climbing 3-5 steps with a railing? : A Little 6 Click Score: 23    End of Session Equipment Utilized During Treatment: Gait belt Activity Tolerance: Patient tolerated treatment well Patient left: in bed;with call bell/phone within reach Nurse Communication: Mobility status PT Visit Diagnosis: Other abnormalities of gait and mobility (R26.89);Muscle weakness (generalized) (M62.81)    Time: 7001-7494 PT Time Calculation (min) (ACUTE ONLY): 23 min   Charges:   PT Evaluation $PT Eval Low Complexity: 1 Low PT Treatments $Therapeutic Activity: 8-22 mins        2:25 PM, 10/21/20 Mearl Latin PT, DPT Physical Therapist at [facility]

## 2020-10-21 NOTE — H&P (Signed)
History and Physical  Roy [name] OIB:[id number] DOB: 09-11-1950 DOA: 10/21/2020   PCP: Bridget Hartshorn, NP   Patient coming from: Home  Chief Complaint: generalized weakness  HPI:  Roy [name] is a 70 y.o. male with medical history of paroxysmal atrial fibrillation, right bundle branch block, GERD, hypertension, hyperlipidemia presenting with 1 week history of generalized weakness, decreased oral intake.  The patient denies any fevers, chills, headache, chest pain, shortness of breath, palpitations, nausea, vomiting, diarrhea.  He has had some intermittent left lower quadrant pain that is worsened with certain movements.  He denies any hematochezia, melena, hemoptysis, hematuria, dysuria.  He has had a weak urinary stream.  He has had some dizziness but denies any syncope.  He states that he recently started HCTZ on 09/20/2020.  At that time, his amlodipine was discontinued.  He has had no other medication changes.  He has had decreased oral intake and decreased appetite. In the emergency department, the patient was afebrile hemodynamically stable with oxygen saturation 94 5% on room air.  BMP showed sodium 138, potassium 2.3, serum creatinine 0.78.  AST 66, ALT 19, alk phosphatase 57, total bilirubin 3.2, WBC 5.0, hemoglobin 17.3, platelets 216,000.  INR was one 5.1.  CT abdomen pelvis was negative for any acute findings.  Assessment/Plan: Generalized weakness -Likely secondary to hypokalemia -Obtain UA and urine culture -X45 -Folic acid -TSH -PT eval  Hypokalemia -Replete -check mag  Supratherapeutic INR/coagulopathy -INR 5.1 -Likely secondary to poor oral intake -The patient last took warfarin on the morning of 10/21/2020 -As the patient has no active bleeding, will allow the patient's INR to trend down  Hyperbilirubinemia -Likely secondary to Gilbert's -This has been a chronic issue for the patient -Fractionate bili -Check LDH -Haptoglobin  Essential  hypertension -Discontinue HCTZ in the setting of his severe hypokalemia -Monitor off antihypertensives for now  Paroxysmal atrial fibrillation -Currently in sinus rhythm -Holding warfarin as discussed above  Class II Obesity -BMI 36.75 -Lifestyle modification  Hyperlipidemia -Continue statin  GERD -Continue PPI       Past Medical History:  Diagnosis Date   Atrial fibrillation (HCC)    GERD (gastroesophageal reflux disease)    High cholesterol    Hypertension    Obesity    Past Surgical History:  Procedure Laterality Date   APPENDECTOMY     CATARACT EXTRACTION Bilateral 2022   CHOLECYSTECTOMY     ESOPHAGEAL DILATION     multiple times   JOINT REPLACEMENT     Social History:  reports that he has never smoked. He has never used smokeless tobacco. He reports that he does not drink alcohol and does not use drugs.   Family History  Problem Relation Age of Onset   Heart failure Father    Heart attack Father        Deceased    Thyroid disease Sister      Allergies  Allergen Reactions   Adhesive [Tape]    Sulfa Antibiotics     UNKNOWN Childhood reaction     Prior to Admission medications   Medication Sig Start Date End Date Taking? Authorizing Provider  amLODipine (NORVASC) 2.5 MG tablet Take 1 tablet (2.5 mg total) by mouth daily. 03/18/18   [name], MD  omeprazole (PRILOSEC OTC) 20 MG tablet Take 20 mg by mouth daily.    [provider]  ondansetron (ZOFRAN ODT) 4 MG disintegrating tablet Take 1 tablet (4 mg total) by mouth every 8 (eight)  hours as needed for nausea. 04/29/19   Noemi Chapel, MD  warfarin (COUMADIN) 5 MG tablet Take 1 tablet by mouth daily. 03/25/18   [provider]    Review of Systems:  Constitutional:  No weight loss, night sweats, Fevers, chills,  Head&Eyes: No headache.  No vision loss.  No eye pain or scotoma ENT:  No Difficulty swallowing,Tooth/dental problems,Sore throat,  No ear ache, post nasal drip,   Cardio-vascular:  No chest pain, Orthopnea, PND, swelling in lower extremities,  dizziness, palpitations  GI:  No  abdominal pain, nausea, vomiting, diarrhea, loss of appetite, hematochezia, melena, heartburn, indigestion, Resp:  No shortness of breath with exertion or at rest. No cough. No coughing up of blood .No wheezing.No chest wall deformity  Skin:  no rash or lesions.  GU:  no dysuria, change in color of urine, no urgency or frequency. No flank pain.  Musculoskeletal:  No joint pain or swelling. No decreased range of motion. No back pain.  Psych:  No change in mood or affect. No depression or anxiety. Neurologic: No headache, no dysesthesia, no focal weakness, no vision loss. No syncope  Physical Exam: Vitals:   10/21/20 0913 10/21/20 0918 10/21/20 0951 10/21/20 1030  BP:  (!) 151/91  122/88  Pulse:  69  (!) 56  Resp:  18  (!) 21  Temp:  98.2 F (36.8 C)    TempSrc:  Oral    SpO2:  96% 94% 93%  Weight: 122.9 kg     Height: 6' (1.829 m)      General:  A&O x 3, NAD, nontoxic, pleasant/cooperative Head/Eye: No conjunctival hemorrhage, no icterus, /AT, No nystagmus ENT:  No icterus,  No thrush, good dentition, no pharyngeal exudate Neck:  No masses, no lymphadenpathy, no bruits CV:  RRR, no rub, no gallop, no S3 Lung:  CTAB, good air movement, no wheeze, no rhonchi Abdomen: soft/NT, +BS, nondistended, no peritoneal signs Ext: No cyanosis, No rashes, No petechiae, No lymphangitis, No edema Neuro: CNII-XII intact, strength 4/5 in bilateral upper and lower extremities, no dysmetria  Labs on Admission:  Basic Metabolic Panel: Recent Labs  Lab 10/21/20 0928  NA 138  K 2.3*  CL 96*  CO2 31  GLUCOSE 106*  BUN 15  CREATININE 0.78  CALCIUM 8.9  MG 1.6*   Liver Function Tests: Recent Labs  Lab 10/21/20 0928  AST 66*  ALT 19  ALKPHOS 57  BILITOT 3.2*  PROT 6.9  ALBUMIN 3.6   Recent Labs  Lab 10/21/20 0928  LIPASE 26   No results for input(s): AMMONIA  in the last 168 hours. CBC: Recent Labs  Lab 10/21/20 0928  WBC 5.0  NEUTROABS 2.0  HGB 17.3*  HCT 47.3  MCV 86.5  PLT 216   Coagulation Profile: Recent Labs  Lab 10/21/20 0928  INR 5.1*   Cardiac Enzymes: No results for input(s): CKTOTAL, CKMB, CKMBINDEX, TROPONINI in the last 168 hours. BNP: Invalid input(s): POCBNP CBG: No results for input(s): GLUCAP in the last 168 hours. Urine analysis:    Component Value Date/Time   COLORURINE AMBER (A) 04/29/2019 1004   APPEARANCEUR HAZY (A) 04/29/2019 1004   LABSPEC 1.017 04/29/2019 1004   PHURINE 6.0 04/29/2019 1004   GLUCOSEU NEGATIVE 04/29/2019 1004   HGBUR SMALL (A) 04/29/2019 1004   BILIRUBINUR NEGATIVE 04/29/2019 1004   KETONESUR NEGATIVE 04/29/2019 1004   PROTEINUR 30 (A) 04/29/2019 1004   UROBILINOGEN 1.0 05/15/2010 2059   NITRITE NEGATIVE 04/29/2019 1004   LEUKOCYTESUR  NEGATIVE 04/29/2019 1004   Sepsis Labs: @LABRCNTIP (procalcitonin:4,lacticidven:4) ) Recent Results (from the past 240 hour(s))  Resp Panel by RT-PCR (Flu A&B, Covid) Nasopharyngeal Swab     Status: None   Collection Time: 10/21/20  9:29 AM   Specimen: Nasopharyngeal Swab; Nasopharyngeal(NP) swabs in vial transport medium  Result Value Ref Range Status   SARS Coronavirus 2 by RT PCR NEGATIVE NEGATIVE Final    Comment: (NOTE) SARS-CoV-2 target nucleic acids are NOT DETECTED.  The SARS-CoV-2 RNA is generally detectable in upper respiratory specimens during the acute phase of infection. The lowest concentration of SARS-CoV-2 viral copies this assay can detect is 138 copies/mL. A negative result does not preclude SARS-Cov-2 infection and should not be used as the sole basis for treatment or other patient management decisions. A negative result may occur with  improper specimen collection/handling, submission of specimen other than nasopharyngeal swab, presence of viral mutation(s) within the areas targeted by this assay, and inadequate number of  viral copies(<138 copies/mL). A negative result must be combined with clinical observations, patient history, and epidemiological information. The expected result is Negative.  Fact Sheet for Patients:  EntrepreneurPulse.com.au  Fact Sheet for Healthcare Providers:  IncredibleEmployment.be  This test is no t yet approved or cleared by the [place] FDA and  has been authorized for detection and/or diagnosis of SARS-CoV-2 by FDA under an Emergency Use Authorization (EUA). This EUA will remain  in effect (meaning this test can be used) for the duration of the COVID-19 declaration under Section 564(b)(1) of the Act, 21 U.S.C.section 360bbb-3(b)(1), unless the authorization is terminated  or revoked sooner.       Influenza A by PCR NEGATIVE NEGATIVE Final   Influenza B by PCR NEGATIVE NEGATIVE Final    Comment: (NOTE) The Xpert Xpress SARS-CoV-2/FLU/RSV plus assay is intended as an aid in the diagnosis of influenza from Nasopharyngeal swab specimens and should not be used as a sole basis for treatment. Nasal washings and aspirates are unacceptable for Xpert Xpress SARS-CoV-2/FLU/RSV testing.  Fact Sheet for Patients: EntrepreneurPulse.com.au  Fact Sheet for Healthcare Providers: IncredibleEmployment.be  This test is not yet approved or cleared by the [place] FDA and has been authorized for detection and/or diagnosis of SARS-CoV-2 by FDA under an Emergency Use Authorization (EUA). This EUA will remain in effect (meaning this test can be used) for the duration of the COVID-19 declaration under Section 564(b)(1) of the Act, 21 U.S.C. section 360bbb-3(b)(1), unless the authorization is terminated or revoked.  Performed at [facility], [address], [place], [place] [address]      Radiological Exams on Admission: CT ABDOMEN PELVIS W CONTRAST  Result Date: 10/21/2020 CLINICAL DATA:   Diverticulitis suspected, weakness, headache, weak urine stream EXAM: CT ABDOMEN AND PELVIS WITH CONTRAST TECHNIQUE: Multidetector CT imaging of the abdomen and pelvis was performed using the standard protocol following bolus administration of intravenous contrast. CONTRAST:  131m OMNIPAQUE IOHEXOL 300 MG/ML  SOLN COMPARISON:  04/29/2019 FINDINGS: Lower chest: No acute abnormality. Hepatobiliary: No focal liver abnormality is seen. Status post cholecystectomy. No biliary dilatation. Pancreas: Unremarkable. No pancreatic ductal dilatation or surrounding inflammatory changes. Spleen: Normal in size without significant abnormality. Adrenals/Urinary Tract: Adrenal glands are unremarkable. Kidneys are normal, without renal calculi, solid lesion, or hydronephrosis. Bladder is unremarkable. Stomach/Bowel: Stomach is within normal limits. Appendix is surgically absent. No evidence of bowel wall thickening, distention, or inflammatory changes. Sigmoid diverticula. Vascular/Lymphatic: Aortic atherosclerosis. No enlarged abdominal or pelvic lymph nodes. Reproductive: Prostatomegaly. Other: No abdominal  wall hernia or abnormality. No abdominopelvic ascites. Musculoskeletal: No acute osseous findings. Ankylosis of the included thoracic and lumbar spine as well as the bilateral sacroiliac joint. Status post left hip total arthroplasty. IMPRESSION: 1. No acute CT findings of the abdomen or pelvis. 2. Sigmoid diverticulosis without evidence of acute diverticulitis. 3. Prostatomegaly. 4. Ankylosis of the included lower thoracic and lumbar spine as well as the bilateral sacroiliac joints, suggestive of ankylosing spondylitis. Aortic Atherosclerosis (ICD10-I70.0). Electronically Signed   By: Eddie Candle M.D.   On: 10/21/2020 11:24    EKG: Independently reviewed. Atrial fib, RBBB    Time spent:60 minutes Code Status:   FULL Family Communication:  No Family at bedside Disposition Plan: expect 1-2 day hospitalization Consults  called: none DVT Prophylaxis: warfarin Malyk Girouard, DO  [facility] Pager [phone]  If 7PM-7AM, please contact night-coverage [email or web address] Password [facility] 10/21/2020, 12:26 PM

## 2020-10-21 NOTE — ED Provider Notes (Signed)
[facility] Provider Note   CSN: [id number] Arrival date & time: 10/21/20  0900     History Chief Complaint  Patient presents with   Weakness    Roy [name] is a 70 y.o. male.  HPI   Pt is a 70 y/o male with a h/o afib, gerd, hld, htn, obesity who presents to the ed today for eval of generalized weakness ongoing for the last 7-10 days. He further reports a weak urinary stream and decreased urine output. Denies dysuria, frequency, or hematuria. He has had some llq abd pain. Denies fever, uri sxs, cough, chest pain, sob, vomiting, diarrhea. Denies any sick contacts. Has had covid vaccines.   Past Medical History:  Diagnosis Date   Atrial fibrillation ([place])    GERD (gastroesophageal reflux disease)    High cholesterol    Hypertension    Obesity     Patient Active Problem List   Diagnosis Date Noted   Cellulitis 03/16/2018   Obesity (BMI 30-39.9) 05/04/2015   Chronic anticoagulation 05/04/2015   Trifascicular block 04/29/2015   LAFB (left anterior fascicular block) 04/29/2015   Essential hypertension    Hypokalemia 04/28/2015   RBBB 04/28/2015   First degree AV block 04/28/2015   PAF (paroxysmal atrial fibrillation) ([place]) 04/27/2015   Dizziness 04/27/2015    Past Surgical History:  Procedure Laterality Date   APPENDECTOMY     CATARACT EXTRACTION Bilateral 2022   CHOLECYSTECTOMY     ESOPHAGEAL DILATION     multiple times   JOINT REPLACEMENT         Family History  Problem Relation Age of Onset   Heart failure Father    Heart attack Father        Deceased    Thyroid disease Sister     Social History   Tobacco Use   Smoking status: Never   Smokeless tobacco: Never  Vaping Use   Vaping Use: Never used  Substance Use Topics   Alcohol use: No   Drug use: No    Home Medications Prior to Admission medications   Medication Sig Start Date End Date Taking? Authorizing Provider  amLODipine (NORVASC) 2.5 MG tablet Take 1 tablet  (2.5 mg total) by mouth daily. 03/18/18   [name], MD  omeprazole (PRILOSEC OTC) 20 MG tablet Take 20 mg by mouth daily.    [provider]  ondansetron (ZOFRAN ODT) 4 MG disintegrating tablet Take 1 tablet (4 mg total) by mouth every 8 (eight) hours as needed for nausea. 04/29/19   Noemi Chapel, MD  warfarin (COUMADIN) 5 MG tablet Take 1 tablet by mouth daily. 03/25/18   [provider]    Allergies    Adhesive [tape] and Sulfa antibiotics  Review of Systems   Review of Systems  Constitutional:  Negative for chills and fever.  HENT:  Negative for congestion, ear pain, rhinorrhea and sore throat.   Eyes:  Negative for visual disturbance.  Respiratory:  Negative for cough and shortness of breath.   Cardiovascular:  Negative for chest pain and leg swelling.  Gastrointestinal:  Positive for abdominal pain. Negative for constipation, diarrhea, nausea and vomiting.  Genitourinary:  Positive for decreased urine volume and difficulty urinating. Negative for dysuria and hematuria.  Musculoskeletal:  Negative for back pain.  Skin:  Negative for color change and rash.  Neurological:  Positive for weakness.  All other systems reviewed and are negative.  Physical Exam Updated Vital Signs BP 122/88   Pulse (!) 56  Temp 98.2 F (36.8 C) (Oral)   Resp (!) 21   Ht 6' (1.829 m)   Wt 122.9 kg   SpO2 93%   BMI 36.75 kg/m   Physical Exam Vitals and nursing note reviewed.  Constitutional:      Appearance: He is well-developed.  HENT:     Head: Normocephalic and atraumatic.  Eyes:     Conjunctiva/sclera: Conjunctivae normal.  Cardiovascular:     Heart sounds: Normal heart sounds. No murmur heard.    Comments: Irregularly irregular Pulmonary:     Effort: Pulmonary effort is normal. No respiratory distress.     Breath sounds: Normal breath sounds. No wheezing, rhonchi or rales.  Abdominal:     General: Bowel sounds are normal.     Palpations: Abdomen is soft.      Tenderness: There is abdominal tenderness (LLQ TTP). There is no guarding or rebound.  Musculoskeletal:     Cervical back: Neck supple.  Skin:    General: Skin is warm and dry.  Neurological:     Mental Status: He is alert.    ED Results / Procedures / Treatments   Labs (all labs ordered are listed, but only abnormal results are displayed) Labs Reviewed  COMPREHENSIVE METABOLIC PANEL - Abnormal; Notable for the following components:      Result Value   Potassium 2.3 (*)    Chloride 96 (*)    Glucose, Bld 106 (*)    AST 66 (*)    Total Bilirubin 3.2 (*)    All other components within normal limits  CBC WITH DIFFERENTIAL/PLATELET - Abnormal; Notable for the following components:   Hemoglobin 17.3 (*)    MCHC 36.6 (*)    All other components within normal limits  PROTIME-INR - Abnormal; Notable for the following components:   Prothrombin Time 47.0 (*)    INR 5.1 (*)    All other components within normal limits  MAGNESIUM - Abnormal; Notable for the following components:   Magnesium 1.6 (*)    All other components within normal limits  TROPONIN I (HIGH SENSITIVITY) - Abnormal; Notable for the following components:   Troponin I (High Sensitivity) 23 (*)    All other components within normal limits  RESP PANEL BY RT-PCR (FLU A&B, COVID) [id number]  URINE CULTURE  LIPASE, BLOOD  URINALYSIS, ROUTINE W REFLEX MICROSCOPIC  URINALYSIS, COMPLETE (UACMP) WITH MICROSCOPIC  TROPONIN I (HIGH SENSITIVITY)    EKG EKG Interpretation  Date/Time:  Friday October 21 2020 09:20:18 EDT Ventricular Rate:  69 PR Interval:    QRS Duration: 176 QT Interval:  618 QTC Calculation: 663 R Axis:   -82 Text Interpretation: Atrial fibrillation RBBB and LAFB Confirmed by Milton Ferguson [phone]) on 10/21/2020 9:23:35 AM  Radiology CT ABDOMEN PELVIS W CONTRAST  Result Date: 10/21/2020 CLINICAL DATA:  Diverticulitis suspected, weakness, headache, weak urine stream EXAM: CT ABDOMEN AND PELVIS WITH CONTRAST  TECHNIQUE: Multidetector CT imaging of the abdomen and pelvis was performed using the standard protocol following bolus administration of intravenous contrast. CONTRAST:  162mL OMNIPAQUE IOHEXOL 300 MG/ML  SOLN COMPARISON:  04/29/2019 FINDINGS: Lower chest: No acute abnormality. Hepatobiliary: No focal liver abnormality is seen. Status post cholecystectomy. No biliary dilatation. Pancreas: Unremarkable. No pancreatic ductal dilatation or surrounding inflammatory changes. Spleen: Normal in size without significant abnormality. Adrenals/Urinary Tract: Adrenal glands are unremarkable. Kidneys are normal, without renal calculi, solid lesion, or hydronephrosis. Bladder is unremarkable. Stomach/Bowel: Stomach is within normal limits. Appendix is surgically absent. No evidence of bowel  wall thickening, distention, or inflammatory changes. Sigmoid diverticula. Vascular/Lymphatic: Aortic atherosclerosis. No enlarged abdominal or pelvic lymph nodes. Reproductive: Prostatomegaly. Other: No abdominal wall hernia or abnormality. No abdominopelvic ascites. Musculoskeletal: No acute osseous findings. Ankylosis of the included thoracic and lumbar spine as well as the bilateral sacroiliac joint. Status post left hip total arthroplasty. IMPRESSION: 1. No acute CT findings of the abdomen or pelvis. 2. Sigmoid diverticulosis without evidence of acute diverticulitis. 3. Prostatomegaly. 4. Ankylosis of the included lower thoracic and lumbar spine as well as the bilateral sacroiliac joints, suggestive of ankylosing spondylitis. Aortic Atherosclerosis (ICD10-I70.0). Electronically Signed   By: Eddie Candle M.D.   On: 10/21/2020 11:24    Procedures Procedures   CRITICAL CARE Performed by: Rodney Booze   Total critical care time: 32 minutes  Critical care time was exclusive of separately billable procedures and treating other patients.  Critical care was necessary to treat or prevent imminent or life-threatening  deterioration.  Critical care was time spent personally by me on the following activities: development of treatment plan with patient and/or surrogate as well as nursing, discussions with consultants, evaluation of patient's response to treatment, examination of patient, obtaining history from patient or surrogate, ordering and performing treatments and interventions, ordering and review of laboratory studies, ordering and review of radiographic studies, pulse oximetry and re-evaluation of patient's condition.   Medications Ordered in ED Medications  potassium chloride SA (KLOR-CON) CR tablet 40 mEq (has no administration in time range)  potassium chloride 10 mEq in 100 mL IVPB (has no administration in time range)  magnesium sulfate IVPB 2 g 50 mL (has no administration in time range)  sodium chloride 0.9 % bolus 1,000 mL (1,000 mLs Intravenous New Bag/Given 10/21/20 0959)  iohexol (OMNIPAQUE) 300 MG/ML solution 100 mL (100 mLs Intravenous Contrast Given 10/21/20 1052)    ED Course  I have reviewed the triage vital signs and the nursing notes.  Pertinent labs & imaging results that were available during my care of the patient were reviewed by me and considered in my medical decision making (see chart for details).    MDM Rules/Calculators/A&P                          70 y/o M presenting for 1 week hx of weakness, decreased urine output. He further reports abd pain.   Reviewed/interpreted labs CBC grossly unremarkable CMP with hypokalemia, normal renal function, slightly elevated AST and elevated bilirubin which appears mildly worse from prior  - po and iv k given.  - no ruq abd ttp to suggest gallbladder pathology as cause for hyperbilirubinemia Lipase neg INR elevated at 5, no obvious active bleeding to warrant vit k or ffp administration Mag low  - iv k given Trop marginally elevated, no cp reported COVID neg UA pending on admission   EKG - with afib, RBBB, LAFB  Imaging  reviewed/interpreted  CT abd/pelvis  -  1. No acute CT findings of the abdomen or pelvis. 2. Sigmoid diverticulosis without evidence of acute diverticulitis. 3. Prostatomegaly. 4. Ankylosis of the included lower thoracic and lumbar spine as well as the bilateral sacroiliac joints, suggestive of ankylosing spondylitis. Aortic Atherosclerosis   Pt with severe hypokalemia, will admit for further tx.   12:03 PM CONSULT with Dr. Carles Collet who accepts patient for admission   Final Clinical Impression(s) / ED Diagnoses Final diagnoses:  Hypokalemia    Rx / DC Orders ED Discharge Orders  None        Bishop Dublin 10/21/20 1203    Milton Ferguson, MD 10/24/20 [phone]

## 2020-10-22 DIAGNOSIS — Z7901 Long term (current) use of anticoagulants: Secondary | ICD-10-CM | POA: Diagnosis not present

## 2020-10-22 DIAGNOSIS — E0590 Thyrotoxicosis, unspecified without thyrotoxic crisis or storm: Secondary | ICD-10-CM | POA: Diagnosis not present

## 2020-10-22 DIAGNOSIS — E876 Hypokalemia: Secondary | ICD-10-CM | POA: Diagnosis not present

## 2020-10-22 DIAGNOSIS — D689 Coagulation defect, unspecified: Secondary | ICD-10-CM | POA: Diagnosis not present

## 2020-10-22 LAB — COMPREHENSIVE METABOLIC PANEL
ALT: 19 U/L (ref 0–44)
AST: 66 U/L — ABNORMAL HIGH (ref 15–41)
Albumin: 3.4 g/dL — ABNORMAL LOW (ref 3.5–5.0)
Alkaline Phosphatase: 52 U/L (ref 38–126)
Anion gap: 11 (ref 5–15)
BUN: 13 mg/dL (ref 8–23)
CO2: 28 mmol/L (ref 22–32)
Calcium: 8.9 mg/dL (ref 8.9–10.3)
Chloride: 99 mmol/L (ref 98–111)
Creatinine, Ser: 0.62 mg/dL (ref 0.61–1.24)
GFR, Estimated: >60 mL/min (ref >60)
Glucose, Bld: 89 mg/dL (ref 70–99)
Potassium: 2.6 mmol/L — CL (ref 3.5–5.1)
Sodium: 138 mmol/L (ref 135–145)
Total Bilirubin: 2.3 mg/dL — ABNORMAL HIGH (ref 0.3–1.2)
Total Protein: 6.5 g/dL (ref 6.5–8.1)

## 2020-10-22 LAB — CBC
HCT: 45.3 % (ref 39.0–52.0)
Hemoglobin: 15.9 g/dL (ref 13.0–17.0)
MCH: 30.9 pg (ref 26.0–34.0)
MCHC: 35.1 g/dL (ref 30.0–36.0)
MCV: 88 fL (ref 80.0–100.0)
Platelets: 220 10*3/uL (ref 150–400)
RBC: 5.15 MIL/uL (ref 4.22–5.81)
RDW: 14.6 % (ref 11.5–15.5)
WBC: 4.2 10*3/uL (ref 4.0–10.5)
nRBC: 0 % (ref 0.0–0.2)

## 2020-10-22 LAB — PROTIME-INR
INR: 5.7 (ref 0.8–1.2)
Prothrombin Time: 51.5 seconds — ABNORMAL HIGH (ref 11.4–15.2)

## 2020-10-22 LAB — RETICULOCYTES
Immature Retic Fract: 16.6 % — ABNORMAL HIGH (ref 2.3–15.9)
RBC.: 5.07 MIL/uL (ref 4.22–5.81)
Retic Count, Absolute: 211.4 10*3/uL — ABNORMAL HIGH (ref 19.0–186.0)
Retic Ct Pct: 4.2 % — ABNORMAL HIGH (ref 0.4–3.1)

## 2020-10-22 LAB — HAPTOGLOBIN: Haptoglobin: 21 mg/dL — ABNORMAL LOW (ref 32–363)

## 2020-10-22 LAB — DIRECT ANTIGLOBULIN TEST (NOT AT ARMC)
DAT, IgG: NEGATIVE
DAT, complement: NEGATIVE

## 2020-10-22 LAB — HIV ANTIBODY (ROUTINE TESTING W REFLEX): HIV Screen 4th Generation wRfx: NONREACTIVE

## 2020-10-22 LAB — MAGNESIUM: Magnesium: 1.8 mg/dL (ref 1.7–2.4)

## 2020-10-22 MED ORDER — POTASSIUM CHLORIDE 10 MEQ/100ML IV SOLN
10.0000 meq | INTRAVENOUS | Status: AC
  Administered 2020-10-22 (×3): 10 meq via INTRAVENOUS
  Filled 2020-10-22 (×3): qty 100

## 2020-10-22 MED ORDER — MAGNESIUM OXIDE -MG SUPPLEMENT 400 (240 MG) MG PO TABS: 400.0000 mg | ORAL_TABLET | Freq: Every day | ORAL | Status: DC

## 2020-10-22 MED ORDER — POTASSIUM CHLORIDE CRYS ER 20 MEQ PO TBCR
40.0000 meq | EXTENDED_RELEASE_TABLET | Freq: Two times a day (BID) | ORAL | Status: DC
  Administered 2020-10-22 (×2): 40 meq via ORAL
  Filled 2020-10-22 (×3): qty 2

## 2020-10-22 MED ORDER — MAGNESIUM OXIDE -MG SUPPLEMENT 400 (240 MG) MG PO TABS
400.0000 mg | ORAL_TABLET | Freq: Two times a day (BID) | ORAL | Status: DC
  Administered 2020-10-22 – 2020-10-24 (×4): 400 mg via ORAL
  Filled 2020-10-22 (×4): qty 1

## 2020-10-22 NOTE — Plan of Care (Signed)
  Problem: Education: Goal: Knowledge of General Education information will improve Description Including pain rating scale, medication(s)/side effects and non-pharmacologic comfort measures Outcome: Progressing   Problem: Health Behavior/Discharge Planning: Goal: Ability to manage health-related needs will improve Outcome: Progressing   

## 2020-10-22 NOTE — Progress Notes (Signed)
Date and time results received: 10/22/20 9:13 AM   Test: Potassium and INR Critical Value: potassium 2.6 and INR 5.7  Name of Provider Notified: Dr. Shanon Brow Tat  Orders Received? Or Actions Taken?: awaiting orders

## 2020-10-22 NOTE — Progress Notes (Signed)
PROGRESS NOTE  Roy [name] NHA:[id number] DOB: 08-28-1950 DOA: 10/21/2020 PCP: Bridget Hartshorn, NP  Brief History:  70 y.o. male with medical history of paroxysmal atrial fibrillation, right bundle branch block, GERD, hypertension, hyperlipidemia presenting with 1 week history of generalized weakness, decreased oral intake.  The patient denies any fevers, chills, headache, chest pain, shortness of breath, palpitations, nausea, vomiting, diarrhea.  He has had some intermittent left lower quadrant pain that is worsened with certain movements.  He denies any hematochezia, melena, hemoptysis, hematuria, dysuria.  He has had a weak urinary stream.  He has had some dizziness but denies any syncope.  He states that he recently started HCTZ on 09/20/2020.  At that time, his amlodipine was discontinued.  He has had no other medication changes.  He has had decreased oral intake and decreased appetite. In the emergency department, the patient was afebrile hemodynamically stable with oxygen saturation 94 5% on room air.  BMP showed sodium 138, potassium 2.3, serum creatinine 0.78.  AST 66, ALT 19, alk phosphatase 57, total bilirubin 3.2, WBC 5.0, hemoglobin 17.3, platelets 216,000.  INR was one 5.1.  CT abdomen pelvis was negative for any acute findings.    Assessment/Plan:  Generalized weakness -Likely secondary to hypokalemia -Obtain UA--no pyuria -O32--919 -Folic TYOM--6.0 -OKH--9.977 -Free T4--1.75 -PT eval   Hypokalemia/hypomagnesemia -Replete -remains low at 2.6 despite aggressive repletion -increase repletion, add IV KCl -start oral mag   Supratherapeutic INR/coagulopathy -INR 5.1>>5.7 -Likely secondary to poor oral intake -The patient last took warfarin on the morning of 10/21/2020 -As the patient has no active bleeding, will allow the patient's INR to trend down   Hyperbilirubinemia -Likely secondary to Gilbert's -This has been a chronic issue for the  patient -Fractionate bili--mostly indirect -Check LDH--475 -Haptoglobin--21 -check direct coomb's test -check retic  Hyperthyroidism -TSH--0.030 -Free T4--1.75 -check TPO antibodies -check thyroglobulin antibody -check thyroid stimulating immunoglobulin  Essential hypertension -Discontinue HCTZ in the setting of his severe hypokalemia -Monitor off antihypertensives for now   Paroxysmal atrial fibrillation -Currently in sinus rhythm -Holding warfarin as discussed above   Class II Obesity -BMI 36.75 -Lifestyle modification   Hyperlipidemia -Continue statin   GERD -Continue PPI     Status is: Observation  The patient will require care spanning > 2 midnights and should be moved to inpatient because: Persistent severe electrolyte disturbances  Dispo: The patient is from: Home              Anticipated d/c is to: Home              Patient currently is not medically stable to d/c.   Difficult to place patient No        Family Communication:   no Family at bedside  Consultants:  none  Code Status:  FULL   DVT Prophylaxis:  warfarin   Procedures: As Listed in Progress Note Above  Antibiotics: None       Subjective: Patient denies fevers, chills, headache, chest pain, dyspnea, nausea, vomiting, diarrhea, abdominal pain, dysuria, hematuria, hematochezia, and melena.   Objective: Vitals:   10/21/20 2305 10/22/20 0514 10/22/20 0900 10/22/20 1334  BP: 138/78 133/78  117/70  Pulse: 61 60  (!) 59  Resp: 20   16  Temp: 98.1 F (36.7 C) 98.5 F (36.9 C)  98 F (36.7 C)  TempSrc: Oral Oral  Oral  SpO2: 93% 90% 95% 92%  Weight:  Height:        Intake/Output Summary (Last 24 hours) at 10/22/2020 1512 Last data filed at 10/22/2020 0830 Gross per 24 hour  Intake 480 ml  Output --  Net 480 ml   Weight change:  Exam:  General:  Pt is alert, follows commands appropriately, not in acute distress HEENT: No icterus, No thrush, No neck mass,  [place]/AT Cardiovascular: RRR, S1/S2, no rubs, no gallops Respiratory: CTA bilaterally, no wheezing, no crackles, no rhonchi Abdomen: Soft/+BS, non tender, non distended, no guarding Extremities: trace LE edema, No lymphangitis, No petechiae, No rashes, no synovitis   Data Reviewed: I have personally reviewed following labs and imaging studies Basic Metabolic Panel: Recent Labs  Lab 10/21/20 0928 10/21/20 1226 10/22/20 0616  NA 138  --  138  K 2.3*  --  2.6*  CL 96*  --  99  CO2 31  --  28  GLUCOSE 106*  --  89  BUN 15  --  13  CREATININE 0.78  --  0.62  CALCIUM 8.9  --  8.9  MG 1.6* 1.7 1.8   Liver Function Tests: Recent Labs  Lab 10/21/20 0928 10/21/20 1226 10/22/20 0616  AST 66*  --  66*  ALT 19  --  19  ALKPHOS 57  --  52  BILITOT 3.2* 2.8* 2.3*  PROT 6.9  --  6.5  ALBUMIN 3.6  --  3.4*   Recent Labs  Lab 10/21/20 0928  LIPASE 26   No results for input(s): AMMONIA in the last 168 hours. Coagulation Profile: Recent Labs  Lab 10/21/20 0928 10/22/20 0616  INR 5.1* 5.7*   CBC: Recent Labs  Lab 10/21/20 0928 10/22/20 0616  WBC 5.0 4.2  NEUTROABS 2.0  --   HGB 17.3* 15.9  HCT 47.3 45.3  MCV 86.5 88.0  PLT 216 220   Cardiac Enzymes: No results for input(s): CKTOTAL, CKMB, CKMBINDEX, TROPONINI in the last 168 hours. BNP: Invalid input(s): POCBNP CBG: No results for input(s): GLUCAP in the last 168 hours. HbA1C: No results for input(s): HGBA1C in the last 72 hours. Urine analysis:    Component Value Date/Time   COLORURINE YELLOW 10/21/2020 1248   APPEARANCEUR HAZY (A) 10/21/2020 1248   LABSPEC <1.005 (L) 10/21/2020 1248   PHURINE 7.0 10/21/2020 1248   GLUCOSEU NEGATIVE 10/21/2020 1248   HGBUR NEGATIVE 10/21/2020 1248   BILIRUBINUR NEGATIVE 10/21/2020 1248   KETONESUR NEGATIVE 10/21/2020 1248   PROTEINUR NEGATIVE 10/21/2020 1248   UROBILINOGEN 1.0 05/15/2010 2059   NITRITE NEGATIVE 10/21/2020 1248   LEUKOCYTESUR NEGATIVE 10/21/2020 1248    Sepsis Labs: @LABRCNTIP (procalcitonin:4,lacticidven:4) ) Recent Results (from the past 240 hour(s))  Resp Panel by RT-PCR (Flu A&B, Covid) Nasopharyngeal Swab     Status: None   Collection Time: 10/21/20  9:29 AM   Specimen: Nasopharyngeal Swab; Nasopharyngeal(NP) swabs in vial transport medium  Result Value Ref Range Status   SARS Coronavirus 2 by RT PCR NEGATIVE NEGATIVE Final    Comment: (NOTE) SARS-CoV-2 target nucleic acids are NOT DETECTED.  The SARS-CoV-2 RNA is generally detectable in upper respiratory specimens during the acute phase of infection. The lowest concentration of SARS-CoV-2 viral copies this assay can detect is 138 copies/mL. A negative result does not preclude SARS-Cov-2 infection and should not be used as the sole basis for treatment or other patient management decisions. A negative result may occur with  improper specimen collection/handling, submission of specimen other than nasopharyngeal swab, presence of viral mutation(s) within the areas  targeted by this assay, and inadequate number of viral copies(<138 copies/mL). A negative result must be combined with clinical observations, patient history, and epidemiological information. The expected result is Negative.  Fact Sheet for Patients:  EntrepreneurPulse.com.au  Fact Sheet for Healthcare Providers:  IncredibleEmployment.be  This test is no t yet approved or cleared by the [place] FDA and  has been authorized for detection and/or diagnosis of SARS-CoV-2 by FDA under an Emergency Use Authorization (EUA). This EUA will remain  in effect (meaning this test can be used) for the duration of the COVID-19 declaration under Section 564(b)(1) of the Act, 21 U.S.C.section 360bbb-3(b)(1), unless the authorization is terminated  or revoked sooner.       Influenza A by PCR NEGATIVE NEGATIVE Final   Influenza B by PCR NEGATIVE NEGATIVE Final    Comment: (NOTE) The  Xpert Xpress SARS-CoV-2/FLU/RSV plus assay is intended as an aid in the diagnosis of influenza from Nasopharyngeal swab specimens and should not be used as a sole basis for treatment. Nasal washings and aspirates are unacceptable for Xpert Xpress SARS-CoV-2/FLU/RSV testing.  Fact Sheet for Patients: EntrepreneurPulse.com.au  Fact Sheet for Healthcare Providers: IncredibleEmployment.be  This test is not yet approved or cleared by the [place] FDA and has been authorized for detection and/or diagnosis of SARS-CoV-2 by FDA under an Emergency Use Authorization (EUA). This EUA will remain in effect (meaning this test can be used) for the duration of the COVID-19 declaration under Section 564(b)(1) of the Act, 21 U.S.C. section 360bbb-3(b)(1), unless the authorization is terminated or revoked.  Performed at [facility], [address], [place], [place] [address]      Scheduled Meds:  pantoprazole  40 mg Oral Daily   potassium chloride  40 mEq Oral BID   rosuvastatin  5 mg Oral QHS   Continuous Infusions:  Procedures/Studies: CT ABDOMEN PELVIS W CONTRAST  Result Date: 10/21/2020 CLINICAL DATA:  Diverticulitis suspected, weakness, headache, weak urine stream EXAM: CT ABDOMEN AND PELVIS WITH CONTRAST TECHNIQUE: Multidetector CT imaging of the abdomen and pelvis was performed using the standard protocol following bolus administration of intravenous contrast. CONTRAST:  168m OMNIPAQUE IOHEXOL 300 MG/ML  SOLN COMPARISON:  04/29/2019 FINDINGS: Lower chest: No acute abnormality. Hepatobiliary: No focal liver abnormality is seen. Status post cholecystectomy. No biliary dilatation. Pancreas: Unremarkable. No pancreatic ductal dilatation or surrounding inflammatory changes. Spleen: Normal in size without significant abnormality. Adrenals/Urinary Tract: Adrenal glands are unremarkable. Kidneys are normal, without renal calculi, solid lesion, or hydronephrosis.  Bladder is unremarkable. Stomach/Bowel: Stomach is within normal limits. Appendix is surgically absent. No evidence of bowel wall thickening, distention, or inflammatory changes. Sigmoid diverticula. Vascular/Lymphatic: Aortic atherosclerosis. No enlarged abdominal or pelvic lymph nodes. Reproductive: Prostatomegaly. Other: No abdominal wall hernia or abnormality. No abdominopelvic ascites. Musculoskeletal: No acute osseous findings. Ankylosis of the included thoracic and lumbar spine as well as the bilateral sacroiliac joint. Status post left hip total arthroplasty. IMPRESSION: 1. No acute CT findings of the abdomen or pelvis. 2. Sigmoid diverticulosis without evidence of acute diverticulitis. 3. Prostatomegaly. 4. Ankylosis of the included lower thoracic and lumbar spine as well as the bilateral sacroiliac joints, suggestive of ankylosing spondylitis. Aortic Atherosclerosis (ICD10-I70.0). Electronically Signed   By: AEddie CandleM.D.   On: 10/21/2020 11:24    DOrson Eva DO  [facility]  If 7PM-7AM, please contact night-coverage [email or web address] Password [email or web address] 10/22/2020, 3:12 PM   LOS: 0 days

## 2020-10-22 NOTE — Care Management Obs Status (Signed)
[facility] NOTIFICATION   Patient Details  Name: Roy [name] MRN: [id number] Date of Birth: 20-Nov-1950   Medicare Observation Status Notification Given:  Yes    Natasha Bence, LCSW 10/22/2020, 3:13 PM

## 2020-10-22 NOTE — Progress Notes (Signed)
ANTICOAGULATION CONSULT NOTE - Initial Consult  Pharmacy Consult for Coumadin Indication: atrial fibrillation  Allergies  Allergen Reactions   Adhesive [Tape]    Sulfa Antibiotics     UNKNOWN Childhood reaction    Patient Measurements: Height: 6' (182.9 cm) Weight: 122 kg (269 lb) IBW/kg (Calculated) : 77.6  Vital Signs: Temp: 98.5 F (36.9 C) (06/18 0514) Temp Source: Oral (06/18 0514) BP: 133/78 (06/18 0514) Pulse Rate: 60 (06/18 0514)  Labs: Recent Labs    10/21/20 0928 10/21/20 1226 10/22/20 0616  HGB 17.3*  --  15.9  HCT 47.3  --  45.3  PLT 216  --  220  LABPROT 47.0*  --  51.5*  INR 5.1*  --  5.7*  CREATININE 0.78  --  0.62  TROPONINIHS 23* 24*  --      Estimated Creatinine Clearance: 115.9 mL/min (by C-G formula based on SCr of 0.62 mg/dL).   Medical History: Past Medical History:  Diagnosis Date   Atrial fibrillation (HCC)    GERD (gastroesophageal reflux disease)    High cholesterol    Hypertension    Obesity     Medications:  See med rec  Assessment: 70 y.o. male with medical history of paroxysmal atrial fibrillation, right bundle branch block, GERD, hypertension, hyperlipidemia presenting with 1 week history of generalized weakness, decreased oral intake. No bleeding noted but INR is elevated at 5.7  Per patient, he takes 5mg  on T, TH, Sat                                   7.5mg  on M,W,F,Sun Goal of Therapy:  INR 2-3 Monitor platelets by anticoagulation protocol: Yes   Plan:  No coumadin today PT-INR daily Monitor for S/S of bleeding   Thomasenia Sales, PharmD, MBA, BCGP Clinical Pharmacist   10/22/2020,9:23 AM

## 2020-10-23 DIAGNOSIS — E876 Hypokalemia: Secondary | ICD-10-CM | POA: Diagnosis present

## 2020-10-23 DIAGNOSIS — R3912 Poor urinary stream: Secondary | ICD-10-CM | POA: Diagnosis present

## 2020-10-23 DIAGNOSIS — I480 Paroxysmal atrial fibrillation: Secondary | ICD-10-CM | POA: Diagnosis present

## 2020-10-23 DIAGNOSIS — Z8349 Family history of other endocrine, nutritional and metabolic diseases: Secondary | ICD-10-CM | POA: Diagnosis not present

## 2020-10-23 DIAGNOSIS — I10 Essential (primary) hypertension: Secondary | ICD-10-CM | POA: Diagnosis present

## 2020-10-23 DIAGNOSIS — Z8249 Family history of ischemic heart disease and other diseases of the circulatory system: Secondary | ICD-10-CM | POA: Diagnosis not present

## 2020-10-23 DIAGNOSIS — Z7901 Long term (current) use of anticoagulants: Secondary | ICD-10-CM | POA: Diagnosis not present

## 2020-10-23 DIAGNOSIS — E7800 Pure hypercholesterolemia, unspecified: Secondary | ICD-10-CM | POA: Diagnosis present

## 2020-10-23 DIAGNOSIS — D689 Coagulation defect, unspecified: Secondary | ICD-10-CM | POA: Diagnosis present

## 2020-10-23 DIAGNOSIS — K219 Gastro-esophageal reflux disease without esophagitis: Secondary | ICD-10-CM | POA: Diagnosis present

## 2020-10-23 DIAGNOSIS — Z6836 Body mass index (BMI) 36.0-36.9, adult: Secondary | ICD-10-CM | POA: Diagnosis not present

## 2020-10-23 DIAGNOSIS — E0590 Thyrotoxicosis, unspecified without thyrotoxic crisis or storm: Secondary | ICD-10-CM | POA: Diagnosis present

## 2020-10-23 DIAGNOSIS — E669 Obesity, unspecified: Secondary | ICD-10-CM | POA: Diagnosis present

## 2020-10-23 DIAGNOSIS — R531 Weakness: Secondary | ICD-10-CM | POA: Diagnosis present

## 2020-10-23 DIAGNOSIS — I4510 Unspecified right bundle-branch block: Secondary | ICD-10-CM | POA: Diagnosis present

## 2020-10-23 DIAGNOSIS — Z79899 Other long term (current) drug therapy: Secondary | ICD-10-CM | POA: Diagnosis not present

## 2020-10-23 DIAGNOSIS — Z882 Allergy status to sulfonamides status: Secondary | ICD-10-CM | POA: Diagnosis not present

## 2020-10-23 DIAGNOSIS — Z20822 Contact with and (suspected) exposure to covid-19: Secondary | ICD-10-CM | POA: Diagnosis present

## 2020-10-23 DIAGNOSIS — Z9049 Acquired absence of other specified parts of digestive tract: Secondary | ICD-10-CM | POA: Diagnosis not present

## 2020-10-23 DIAGNOSIS — Z9109 Other allergy status, other than to drugs and biological substances: Secondary | ICD-10-CM | POA: Diagnosis not present

## 2020-10-23 DIAGNOSIS — E785 Hyperlipidemia, unspecified: Secondary | ICD-10-CM | POA: Diagnosis present

## 2020-10-23 LAB — COMPREHENSIVE METABOLIC PANEL
ALT: 20 U/L (ref 0–44)
AST: 73 U/L — ABNORMAL HIGH (ref 15–41)
Albumin: 3.3 g/dL — ABNORMAL LOW (ref 3.5–5.0)
Alkaline Phosphatase: 51 U/L (ref 38–126)
Anion gap: 9 (ref 5–15)
BUN: 16 mg/dL (ref 8–23)
CO2: 29 mmol/L (ref 22–32)
Calcium: 9.2 mg/dL (ref 8.9–10.3)
Chloride: 101 mmol/L (ref 98–111)
Creatinine, Ser: 0.64 mg/dL (ref 0.61–1.24)
GFR, Estimated: >60 mL/min (ref >60)
Glucose, Bld: 93 mg/dL (ref 70–99)
Potassium: 2.9 mmol/L — ABNORMAL LOW (ref 3.5–5.1)
Sodium: 139 mmol/L (ref 135–145)
Total Bilirubin: 1.7 mg/dL — ABNORMAL HIGH (ref 0.3–1.2)
Total Protein: 6.4 g/dL — ABNORMAL LOW (ref 6.5–8.1)

## 2020-10-23 LAB — PROTIME-INR
INR: 5.1 (ref 0.8–1.2)
Prothrombin Time: 46.9 seconds — ABNORMAL HIGH (ref 11.4–15.2)

## 2020-10-23 LAB — CBC
HCT: 44.6 % (ref 39.0–52.0)
Hemoglobin: 15.7 g/dL (ref 13.0–17.0)
MCH: 31 pg (ref 26.0–34.0)
MCHC: 35.2 g/dL (ref 30.0–36.0)
MCV: 88.1 fL (ref 80.0–100.0)
Platelets: 213 10*3/uL (ref 150–400)
RBC: 5.06 MIL/uL (ref 4.22–5.81)
RDW: 14.6 % (ref 11.5–15.5)
WBC: 4.4 10*3/uL (ref 4.0–10.5)
nRBC: 0 % (ref 0.0–0.2)

## 2020-10-23 LAB — URINE CULTURE: Culture: <10000 — AB

## 2020-10-23 LAB — LACTATE DEHYDROGENASE: LDH: 408 U/L — ABNORMAL HIGH (ref 98–192)

## 2020-10-23 MED ORDER — POTASSIUM CHLORIDE 10 MEQ/100ML IV SOLN
10.0000 meq | INTRAVENOUS | Status: AC
  Administered 2020-10-23 (×4): 10 meq via INTRAVENOUS
  Filled 2020-10-23 (×4): qty 100

## 2020-10-23 MED ORDER — POTASSIUM CHLORIDE CRYS ER 20 MEQ PO TBCR
40.0000 meq | EXTENDED_RELEASE_TABLET | Freq: Four times a day (QID) | ORAL | Status: AC
  Administered 2020-10-23 (×3): 40 meq via ORAL
  Filled 2020-10-23 (×2): qty 2

## 2020-10-23 NOTE — Progress Notes (Signed)
PROGRESS NOTE  Roy [name] INO:[id number] DOB: 26-Aug-1950 DOA: 10/21/2020 PCP: Bridget Hartshorn, NP    Brief History:  70 y.o. male with medical history of paroxysmal atrial fibrillation, right bundle branch block, GERD, hypertension, hyperlipidemia presenting with 1 week history of generalized weakness, decreased oral intake.  The patient denies any fevers, chills, headache, chest pain, shortness of breath, palpitations, nausea, vomiting, diarrhea.  He has had some intermittent left lower quadrant pain that is worsened with certain movements.  He denies any hematochezia, melena, hemoptysis, hematuria, dysuria.  He has had a weak urinary stream.  He has had some dizziness but denies any syncope.  He states that he recently started HCTZ on 09/20/2020.  At that time, his amlodipine was discontinued.  He has had no other medication changes.  He has had decreased oral intake and decreased appetite. In the emergency department, the patient was afebrile hemodynamically stable with oxygen saturation 94 5% on room air.  BMP showed sodium 138, potassium 2.3, serum creatinine 0.78.  AST 66, ALT 19, alk phosphatase 57, total bilirubin 3.2, WBC 5.0, hemoglobin 17.3, platelets 216,000.  INR was one 5.1.  CT abdomen pelvis was negative for any acute findings.     Assessment/Plan:   Generalized weakness -Likely secondary to hypokalemia -Obtain UA--no pyuria -S96--283 -Folic MOQH--4.7 -MLY--6.503 -Free T4--1.75 -PT eval--no followup    Hypokalemia/hypomagnesemia -Replete -remains low at 2.6 despite aggressive repletion -increase repletion, add IV KCl -start oral mag   Supratherapeutic INR/coagulopathy -INR 5.1>>5.7>>5.1 -Likely secondary to poor oral intake -The patient last took warfarin on the morning of 10/21/2020 -As the patient has no active bleeding, will allow the patient's INR to trend down   Hyperbilirubinemia -Likely secondary to Gilbert's -This has been a chronic issue  for the patient -Fractionate bili--mostly indirect -Check LDH--475 -Haptoglobin--21 -check direct coomb's test--neg -check retic--4.1% -discussed with hematology, Dr. Rudell Cobb haptoglobin may be due to liver disease, NASH--can follow up in office   Hyperthyroidism -TSH--0.030 -Free T4--1.75 -check TPO antibodies -check thyroglobulin antibody -check thyroid stimulating immunoglobulin   Essential hypertension -Discontinue HCTZ in the setting of his severe hypokalemia -Monitor off antihypertensives for now   Paroxysmal atrial fibrillation -Currently in sinus rhythm -Holding warfarin as discussed above   Class II Obesity -BMI 36.75 -Lifestyle modification   Hyperlipidemia -Continue statin   GERD -Continue PPI       Status is: Observation   The patient will require care spanning > 2 midnights and should be moved to inpatient because: Persistent severe electrolyte disturbances   Dispo: The patient is from: Home              Anticipated d/c is to: Home              Patient currently is not medically stable to d/c.              Difficult to place patient No               Family Communication:   son updated 6/18   Consultants:  none   Code Status:  FULL   DVT Prophylaxis:  warfarin     Procedures: As Listed in Progress Note Above   Antibiotics: None   Subjective: Patient is feeling stronger.  Patient denies fevers, chills, headache, chest pain, dyspnea, nausea, vomiting, diarrhea, abdominal pain, dysuria, hematuria, hematochezia, and melena.   Objective: Vitals:   10/22/20 1334 10/22/20 2106 10/23/20 0558 10/23/20 1635  BP: 117/70 131/71 (!) 146/71 127/64  Pulse: (!) 59 (!) 59 (!) 59 (!) 58  Resp: _0 Temp: 98 F (36.7 C) 98.6 F (37 C) 98.7 F (37.1 C) 97.7 F (36.5 C)  TempSrc: Oral Oral Oral Oral  SpO2: 92% 94% 94% 98%  Weight:      Height:        Intake/Output Summary (Last 24 hours) at 10/23/2020 1725 Last data filed  at 10/23/2020 1500 Gross per 24 hour  Intake 900.11 ml  Output --  Net 900.11 ml   Weight change:  Exam:  General:  Pt is alert, follows commands appropriately, not in acute distress HEENT: No icterus, No thrush, No neck mass, [place]/AT Cardiovascular: RRR, S1/S2, no rubs, no gallops Respiratory: CTA bilaterally, no wheezing, no crackles, no rhonchi Abdomen: Soft/+BS, non tender, non distended, no guarding Extremities: No edema, No lymphangitis, No petechiae, No rashes, no synovitis   Data Reviewed: I have personally reviewed following labs and imaging studies Basic Metabolic Panel: Recent Labs  Lab 10/21/20 0928 10/21/20 1226 10/22/20 0616 10/23/20 0613  NA 138  --  138 139  K 2.3*  --  2.6* 2.9*  CL 96*  --  99 101  CO2 31  --  28 29  GLUCOSE 106*  --  89 93  BUN 15  --  13 16  CREATININE 0.78  --  0.62 0.64  CALCIUM 8.9  --  8.9 9.2  MG 1.6* 1.7 1.8  --    Liver Function Tests: Recent Labs  Lab 10/21/20 0928 10/21/20 1226 10/22/20 0616 10/23/20 0613  AST 66*  --  66* 73*  ALT 19  --  19 20  ALKPHOS 57  --  52 51  BILITOT 3.2* 2.8* 2.3* 1.7*  PROT 6.9  --  6.5 6.4*  ALBUMIN 3.6  --  3.4* 3.3*   Recent Labs  Lab 10/21/20 0928  LIPASE 26   No results for input(s): AMMONIA in the last 168 hours. Coagulation Profile: Recent Labs  Lab 10/21/20 0928 10/22/20 0616 10/23/20 0613  INR 5.1* 5.7* 5.1*   CBC: Recent Labs  Lab 10/21/20 0928 10/22/20 0616 10/23/20 0613  WBC 5.0 4.2 4.4  NEUTROABS 2.0  --   --   HGB 17.3* 15.9 15.7  HCT 47.3 45.3 44.6  MCV 86.5 88.0 88.1  PLT 216 220 213   Cardiac Enzymes: No results for input(s): CKTOTAL, CKMB, CKMBINDEX, TROPONINI in the last 168 hours. BNP: Invalid input(s): POCBNP CBG: No results for input(s): GLUCAP in the last 168 hours. HbA1C: No results for input(s): HGBA1C in the last 72 hours. Urine analysis:    Component Value Date/Time   COLORURINE YELLOW 10/21/2020 1248   APPEARANCEUR HAZY (A)  10/21/2020 1248   LABSPEC <1.005 (L) 10/21/2020 1248   PHURINE 7.0 10/21/2020 1248   GLUCOSEU NEGATIVE 10/21/2020 1248   HGBUR NEGATIVE 10/21/2020 1248   BILIRUBINUR NEGATIVE 10/21/2020 1248   KETONESUR NEGATIVE 10/21/2020 1248   PROTEINUR NEGATIVE 10/21/2020 1248   UROBILINOGEN 1.0 05/15/2010 2059   NITRITE NEGATIVE 10/21/2020 1248   LEUKOCYTESUR NEGATIVE 10/21/2020 1248   Sepsis Labs: _1 (procalcitonin:4,lacticidven:4) ) Recent Results (from the past 240 hour(s))  Resp Panel by RT-PCR (Flu A&B, Covid) Nasopharyngeal Swab     Status: None   Collection Time: 10/21/20  9:29 AM   Specimen: Nasopharyngeal Swab; Nasopharyngeal(NP) swabs in vial transport medium  Result Value Ref Range Status   SARS Coronavirus 2 by RT PCR NEGATIVE NEGATIVE  Final    Comment: (NOTE) SARS-CoV-2 target nucleic acids are NOT DETECTED.  The SARS-CoV-2 RNA is generally detectable in upper respiratory specimens during the acute phase of infection. The lowest concentration of SARS-CoV-2 viral copies this assay can detect is 138 copies/mL. A negative result does not preclude SARS-Cov-2 infection and should not be used as the sole basis for treatment or other patient management decisions. A negative result may occur with  improper specimen collection/handling, submission of specimen other than nasopharyngeal swab, presence of viral mutation(s) within the areas targeted by this assay, and inadequate number of viral copies(<138 copies/mL). A negative result must be combined with clinical observations, patient history, and epidemiological information. The expected result is Negative.  Fact Sheet for Patients:  EntrepreneurPulse.com.au  Fact Sheet for Healthcare Providers:  IncredibleEmployment.be  This test is no t yet approved or cleared by the [place] FDA and  has been authorized for detection and/or diagnosis of SARS-CoV-2 by FDA under an Emergency Use  Authorization (EUA). This EUA will remain  in effect (meaning this test can be used) for the duration of the COVID-19 declaration under Section 564(b)(1) of the Act, 21 U.S.C.section 360bbb-3(b)(1), unless the authorization is terminated  or revoked sooner.       Influenza A by PCR NEGATIVE NEGATIVE Final   Influenza B by PCR NEGATIVE NEGATIVE Final    Comment: (NOTE) The Xpert Xpress SARS-CoV-2/FLU/RSV plus assay is intended as an aid in the diagnosis of influenza from Nasopharyngeal swab specimens and should not be used as a sole basis for treatment. Nasal washings and aspirates are unacceptable for Xpert Xpress SARS-CoV-2/FLU/RSV testing.  Fact Sheet for Patients: EntrepreneurPulse.com.au  Fact Sheet for Healthcare Providers: IncredibleEmployment.be  This test is not yet approved or cleared by the [place] FDA and has been authorized for detection and/or diagnosis of SARS-CoV-2 by FDA under an Emergency Use Authorization (EUA). This EUA will remain in effect (meaning this test can be used) for the duration of the COVID-19 declaration under Section 564(b)(1) of the Act, 21 U.S.C. section 360bbb-3(b)(1), unless the authorization is terminated or revoked.  Performed at [facility], [address], [place], [place] [address]   Culture, Urine     Status: Abnormal   Collection Time: 10/21/20 12:48 PM   Specimen: Urine, Clean Catch  Result Value Ref Range Status   Specimen Description   Final    URINE, CLEAN CATCH Performed at [facility], [address], [place], [place] [address]    Special Requests   Final    NONE Performed at [facility], [address] [place], [place] [address]    Culture (A)  Final    <10,000 COLONIES/mL INSIGNIFICANT GROWTH Performed at [facility] [address], [place], [place] [address]    Report Status 10/23/2020 FINAL  Final     Scheduled Meds:  magnesium oxide  400 mg Oral BID    pantoprazole  40 mg Oral Daily   potassium chloride  40 mEq Oral Q6H   rosuvastatin  5 mg Oral QHS   Continuous Infusions:  Procedures/Studies: CT ABDOMEN PELVIS W CONTRAST  Result Date: 10/21/2020 CLINICAL DATA:  Diverticulitis suspected, weakness, headache, weak urine stream EXAM: CT ABDOMEN AND PELVIS WITH CONTRAST TECHNIQUE: Multidetector CT imaging of the abdomen and pelvis was performed using the standard protocol following bolus administration of intravenous contrast. CONTRAST:  13m OMNIPAQUE IOHEXOL 300 MG/ML  SOLN COMPARISON:  04/29/2019 FINDINGS: Lower chest: No acute abnormality. Hepatobiliary: No focal liver abnormality  is seen. Status post cholecystectomy. No biliary dilatation. Pancreas: Unremarkable. No pancreatic ductal dilatation or surrounding inflammatory changes. Spleen: Normal in size without significant abnormality. Adrenals/Urinary Tract: Adrenal glands are unremarkable. Kidneys are normal, without renal calculi, solid lesion, or hydronephrosis. Bladder is unremarkable. Stomach/Bowel: Stomach is within normal limits. Appendix is surgically absent. No evidence of bowel wall thickening, distention, or inflammatory changes. Sigmoid diverticula. Vascular/Lymphatic: Aortic atherosclerosis. No enlarged abdominal or pelvic lymph nodes. Reproductive: Prostatomegaly. Other: No abdominal wall hernia or abnormality. No abdominopelvic ascites. Musculoskeletal: No acute osseous findings. Ankylosis of the included thoracic and lumbar spine as well as the bilateral sacroiliac joint. Status post left hip total arthroplasty. IMPRESSION: 1. No acute CT findings of the abdomen or pelvis. 2. Sigmoid diverticulosis without evidence of acute diverticulitis. 3. Prostatomegaly. 4. Ankylosis of the included lower thoracic and lumbar spine as well as the bilateral sacroiliac joints, suggestive of ankylosing spondylitis. Aortic Atherosclerosis (ICD10-I70.0). Electronically Signed   By: Eddie Candle M.D.   On:  10/21/2020 11:24    [name], DO  [facility]  If 7PM-7AM, please contact night-coverage [email or web address] Password [email or web address] 10/23/2020, 5:25 PM   LOS: 0 days

## 2020-10-23 NOTE — Progress Notes (Signed)
Date and time results received: 10/23/20 0830   Test: INR  Critical Value:5.1  Name of Provider Notified Dr. Carles Collet via [facility] pager

## 2020-10-23 NOTE — Progress Notes (Signed)
ANTICOAGULATION CONSULT NOTE - Initial Consult  Pharmacy Consult for Coumadin Indication: atrial fibrillation  Allergies  Allergen Reactions   Adhesive [Tape]    Sulfa Antibiotics     UNKNOWN Childhood reaction    Patient Measurements: Height: 6' (182.9 cm) Weight: 122 kg (269 lb) IBW/kg (Calculated) : 77.6  Vital Signs: Temp: 98.7 F (37.1 C) (06/19 0558) Temp Source: Oral (06/19 0558) BP: 146/71 (06/19 0558) Pulse Rate: 59 (06/19 0558)  Labs: Recent Labs    10/21/20 0928 10/21/20 1226 10/22/20 0616 10/23/20 0613  HGB 17.3*  --  15.9 15.7  HCT 47.3  --  45.3 44.6  PLT 216  --  220 213  LABPROT 47.0*  --  51.5* 46.9*  INR 5.1*  --  5.7* 5.1*  CREATININE 0.78  --  0.62 0.64  TROPONINIHS 23* 24*  --   --      Estimated Creatinine Clearance: 115.9 mL/min (by C-G formula based on SCr of 0.64 mg/dL).   Medical History: Past Medical History:  Diagnosis Date   Atrial fibrillation (HCC)    GERD (gastroesophageal reflux disease)    High cholesterol    Hypertension    Obesity     Medications:  See med rec  Assessment: 70 y.o. male with medical history of paroxysmal atrial fibrillation, right bundle branch block, GERD, hypertension, hyperlipidemia presenting with 1 week history of generalized weakness, decreased oral intake. No bleeding noted but INR is elevated at 5.1  Per patient, he takes 5mg  on T, TH, Sat                                   7.5mg  on M,W,F,Sun Goal of Therapy:  INR 2-3 Monitor platelets by anticoagulation protocol: Yes   Plan:  No coumadin today PT-INR daily Monitor for S/S of bleeding   Thomasenia Sales, PharmD, MBA, BCGP Clinical Pharmacist   10/23/2020,10:18 AM

## 2020-10-24 DIAGNOSIS — E876 Hypokalemia: Secondary | ICD-10-CM | POA: Diagnosis not present

## 2020-10-24 DIAGNOSIS — E0590 Thyrotoxicosis, unspecified without thyrotoxic crisis or storm: Secondary | ICD-10-CM | POA: Diagnosis not present

## 2020-10-24 DIAGNOSIS — E669 Obesity, unspecified: Secondary | ICD-10-CM | POA: Diagnosis not present

## 2020-10-24 DIAGNOSIS — Z7901 Long term (current) use of anticoagulants: Secondary | ICD-10-CM | POA: Diagnosis not present

## 2020-10-24 LAB — MAGNESIUM: Magnesium: 1.4 mg/dL — ABNORMAL LOW (ref 1.7–2.4)

## 2020-10-24 LAB — BASIC METABOLIC PANEL
Anion gap: 9 (ref 5–15)
BUN: 12 mg/dL (ref 8–23)
CO2: 25 mmol/L (ref 22–32)
Calcium: 9 mg/dL (ref 8.9–10.3)
Chloride: 103 mmol/L (ref 98–111)
Creatinine, Ser: 0.63 mg/dL (ref 0.61–1.24)
GFR, Estimated: >60 mL/min (ref >60)
Glucose, Bld: 87 mg/dL (ref 70–99)
Potassium: 3.4 mmol/L — ABNORMAL LOW (ref 3.5–5.1)
Sodium: 137 mmol/L (ref 135–145)

## 2020-10-24 LAB — LACTATE DEHYDROGENASE: LDH: 406 U/L — ABNORMAL HIGH (ref 98–192)

## 2020-10-24 LAB — PROTIME-INR
INR: 3.8 — ABNORMAL HIGH (ref 0.8–1.2)
Prothrombin Time: 37.1 seconds — ABNORMAL HIGH (ref 11.4–15.2)

## 2020-10-24 MED ORDER — AMLODIPINE BESYLATE 2.5 MG PO TABS: 2.5000 mg | ORAL_TABLET | Freq: Every day | ORAL | 1 refills | Status: DC

## 2020-10-24 MED ORDER — POTASSIUM CHLORIDE CRYS ER 20 MEQ PO TBCR: 40.0000 meq | EXTENDED_RELEASE_TABLET | Freq: Every day | ORAL | 0 refills | Status: DC

## 2020-10-24 MED ORDER — WARFARIN SODIUM 5 MG PO TABS: 5.0000 mg | ORAL_TABLET | Freq: Every day | ORAL | Status: DC

## 2020-10-24 MED ORDER — POTASSIUM CHLORIDE CRYS ER 20 MEQ PO TBCR: 40.0000 meq | EXTENDED_RELEASE_TABLET | Freq: Every day | ORAL | Status: DC

## 2020-10-24 MED ORDER — MAGNESIUM SULFATE 2 GM/50ML IV SOLN
2.0000 g | Freq: Once | INTRAVENOUS | Status: AC
  Administered 2020-10-24: 2 g via INTRAVENOUS
  Filled 2020-10-24: qty 50

## 2020-10-24 MED ORDER — POTASSIUM CHLORIDE CRYS ER 20 MEQ PO TBCR
40.0000 meq | EXTENDED_RELEASE_TABLET | Freq: Once | ORAL | Status: AC
  Administered 2020-10-24: 40 meq via ORAL
  Filled 2020-10-24: qty 2

## 2020-10-24 MED ORDER — MAGNESIUM OXIDE -MG SUPPLEMENT 400 (240 MG) MG PO TABS: 400.0000 mg | ORAL_TABLET | Freq: Two times a day (BID) | ORAL | 1 refills | Status: DC

## 2020-10-24 NOTE — Plan of Care (Signed)

## 2020-10-24 NOTE — TOC Transition Note (Signed)
Transition of Care [facility]) - CM/SW Discharge Note   Patient Details  Name: Roy [name] MRN: [id number] Date of Birth: [birth year]  Transition of Care [facility]) CM/SW Contact:  Natasha Bence, LCSW Phone Number: 10/24/2020, 1:48 PM   Clinical Narrative:    CSW notified of patient's readiness for discharge and request for PCP list. CSW provided PCP list to patient. TOC signing off.   Final next level of care: Home/Self Care Barriers to Discharge: Barriers Resolved   Patient Goals and CMS Choice   CMS Medicare.gov Compare Post Acute Care list provided to:: Patient    Discharge Placement                    Patient and family notified of of transfer: 10/24/20  Discharge Plan and Services                                     Social Determinants of Health (SDOH) Interventions     Readmission Risk Interventions No flowsheet data found.

## 2020-10-24 NOTE — Progress Notes (Signed)
ANTICOAGULATION CONSULT NOTE -   Pharmacy Consult for Coumadin Indication: atrial fibrillation  Allergies  Allergen Reactions   Adhesive [Tape]    Sulfa Antibiotics     UNKNOWN Childhood reaction    Patient Measurements: Height: 6' (182.9 cm) Weight: 122 kg (269 lb) IBW/kg (Calculated) : 77.6  Vital Signs: Temp: 98.1 F (36.7 C) (06/20 0600) Temp Source: Oral (06/20 0600) BP: 136/76 (06/20 0600) Pulse Rate: 57 (06/20 0600)  Labs: Recent Labs    10/21/20 0928 10/21/20 1226 10/22/20 0616 10/23/20 0613 10/24/20 0607  HGB 17.3*  --  15.9 15.7  --   HCT 47.3  --  45.3 44.6  --   PLT 216  --  220 213  --   LABPROT 47.0*  --  51.5* 46.9* 37.1*  INR 5.1*  --  5.7* 5.1* 3.8*  CREATININE 0.78  --  0.62 0.64  --   TROPONINIHS 23* 24*  --   --   --      Estimated Creatinine Clearance: 115.9 mL/min (by C-G formula based on SCr of 0.64 mg/dL).   Medical History: Past Medical History:  Diagnosis Date   Atrial fibrillation (HCC)    GERD (gastroesophageal reflux disease)    High cholesterol    Hypertension    Obesity     Medications:  See med rec  Assessment: 70 y.o. male with medical history of paroxysmal atrial fibrillation, right bundle branch block, GERD, hypertension, hyperlipidemia presenting with 1 week history of generalized weakness, decreased oral intake. No bleeding noted but INR is elevated at 3.8  Per patient, he takes 5mg  on T, TH, Sat                                   7.5mg  on M,W,F,Sun Goal of Therapy:  INR 2-3 Monitor platelets by anticoagulation protocol: Yes   Plan:  No coumadin today PT-INR daily Monitor for S/S of bleeding   Margot Ables, PharmD Clinical Pharmacist 10/24/2020 7:59 AM

## 2020-10-24 NOTE — Plan of Care (Signed)
  Problem: Education: Goal: Knowledge of General Education information will improve Description: Including pain rating scale, medication(s)/side effects and non-pharmacologic comfort measures 10/24/2020 1246 by Melony Overly, RN Outcome: Adequate for Discharge 10/24/2020 1108 by Melony Overly, RN Outcome: Progressing   Problem: Health Behavior/Discharge Planning: Goal: Ability to manage health-related needs will improve 10/24/2020 1246 by Melony Overly, RN Outcome: Adequate for Discharge 10/24/2020 1108 by Melony Overly, RN Outcome: Progressing   Problem: Clinical Measurements: Goal: Ability to maintain clinical measurements within normal limits will improve 10/24/2020 1246 by Melony Overly, RN Outcome: Adequate for Discharge 10/24/2020 1108 by Melony Overly, RN Outcome: Progressing Goal: Will remain free from infection 10/24/2020 1246 by Melony Overly, RN Outcome: Adequate for Discharge 10/24/2020 1108 by Melony Overly, RN Outcome: Progressing Goal: Diagnostic test results will improve 10/24/2020 1246 by Melony Overly, RN Outcome: Adequate for Discharge 10/24/2020 1108 by Melony Overly, RN Outcome: Progressing Goal: Respiratory complications will improve 10/24/2020 1246 by Melony Overly, RN Outcome: Adequate for Discharge 10/24/2020 1108 by Melony Overly, RN Outcome: Progressing Goal: Cardiovascular complication will be avoided 10/24/2020 1246 by Melony Overly, RN Outcome: Adequate for Discharge 10/24/2020 1108 by Melony Overly, RN Outcome: Progressing   Problem: Activity: Goal: Risk for activity intolerance will decrease 10/24/2020 1246 by Melony Overly, RN Outcome: Adequate for Discharge 10/24/2020 1108 by Melony Overly, RN Outcome: Progressing   Problem: Nutrition: Goal: Adequate nutrition will be maintained 10/24/2020 1246 by Melony Overly, RN Outcome: Adequate for Discharge 10/24/2020 1108 by Melony Overly, RN Outcome: Progressing    Problem: Coping: Goal: Level of anxiety will decrease 10/24/2020 1246 by Melony Overly, RN Outcome: Adequate for Discharge 10/24/2020 1108 by Melony Overly, RN Outcome: Progressing   Problem: Elimination: Goal: Will not experience complications related to bowel motility 10/24/2020 1246 by Melony Overly, RN Outcome: Adequate for Discharge 10/24/2020 1108 by Melony Overly, RN Outcome: Progressing Goal: Will not experience complications related to urinary retention 10/24/2020 1246 by Melony Overly, RN Outcome: Adequate for Discharge 10/24/2020 1108 by Melony Overly, RN Outcome: Progressing   Problem: Pain Managment: Goal: General experience of comfort will improve 10/24/2020 1246 by Melony Overly, RN Outcome: Adequate for Discharge 10/24/2020 1108 by Melony Overly, RN Outcome: Progressing   Problem: Safety: Goal: Ability to remain free from injury will improve 10/24/2020 1246 by Melony Overly, RN Outcome: Adequate for Discharge 10/24/2020 1108 by Melony Overly, RN Outcome: Progressing   Problem: Skin Integrity: Goal: Risk for impaired skin integrity will decrease 10/24/2020 1246 by Melony Overly, RN Outcome: Adequate for Discharge 10/24/2020 1108 by Melony Overly, RN Outcome: Progressing

## 2020-10-24 NOTE — Discharge Summary (Signed)
Physician Discharge Summary  Roy [name] OQH:[id number] DOB: 12/20/1950 DOA: 10/21/2020  PCP: Bridget Hartshorn, NP  Admit date: 10/21/2020 Discharge date: 10/24/2020  Admitted From: Home Disposition:  Home   Recommendations for Outpatient Follow-up:  Follow up with PCP in 1-2 weeks Please obtain BMP/CBC in one week    Discharge Condition: Stable CODE STATUS:FULL Diet recommendation: Heart Healthy   Brief/Interim Summary: 70 y.o. male with medical history of paroxysmal atrial fibrillation, right bundle branch block, GERD, hypertension, hyperlipidemia presenting with 1 week history of generalized weakness, decreased oral intake.  The patient denies any fevers, chills, headache, chest pain, shortness of breath, palpitations, nausea, vomiting, diarrhea.  He has had some intermittent left lower quadrant pain that is worsened with certain movements.  He denies any hematochezia, melena, hemoptysis, hematuria, dysuria.  He has had a weak urinary stream.  He has had some dizziness but denies any syncope.  He states that he recently started HCTZ on 09/20/2020.  At that time, his amlodipine was discontinued.  He has had no other medication changes.  He has had decreased oral intake and decreased appetite. In the emergency department, the patient was afebrile hemodynamically stable with oxygen saturation 94 5% on room air.  BMP showed sodium 138, potassium 2.3, serum creatinine 0.78.  AST 66, ALT 19, alk phosphatase 57, total bilirubin 3.2, WBC 5.0, hemoglobin 17.3, platelets 216,000.  INR was one 5.1.  CT abdomen pelvis was negative for any acute findings.  Discharge Diagnoses:   Generalized weakness -Likely secondary to hypokalemia -Obtain UA--no pyuria -T46--568 -Folic LEXN--1.7 -GYF--7.494 -Free T4--1.75 -PT eval--no followup  -overall improved as KCl was repleted   Hypokalemia/hypomagnesemia -Replete -remains low at 2.6 despite aggressive repletion -increased repletion, added  IV KCl -started oral mag -d/c home with KCl 40 mEq daily   Supratherapeutic INR/coagulopathy -INR 5.1>>5.7>>5.1 -Likely secondary to poor oral intake -The patient last took warfarin on the morning of 10/21/2020 -As the patient has no active bleeding, will allow the patient's INR to trend down   Hyperbilirubinemia -Likely secondary to Gilbert's -This has been a chronic issue for the patient -Fractionate bili--mostly indirect -Check LDH--475 -Haptoglobin--21 -check direct coomb's test--neg -check retic--4.1% -discussed with hematology, Dr. Rudell Cobb haptoglobin may be due to liver disease, NASH--can follow up in office   Hyperthyroidism -TSH--0.030 -Free T4--1.75 -check TPO antibodies--pending at time of d/c -check thyroglobulin antibody -check thyroid stimulating immunoglobulin -outpatient endocrine appointment scheduled for 11/09/20 at 9:30 AM   Essential hypertension -Discontinue HCTZ in the setting of his severe hypokalemia -Monitor off antihypertensives for now   Paroxysmal atrial fibrillation -Currently in sinus rhythm -Holding warfarin as discussed above -INR 3.7 on day of d/c -resume warfarin 5 mg daily on 10/26/19 and follow up PCP for INR check in 3-4 days   Class II Obesity -BMI 36.75 -Lifestyle modification   Hyperlipidemia -Continue statin   GERD -Continue PPI     Discharge Instructions  Discharge Instructions     Ambulatory referral to Hematology / Oncology   Complete by: As directed    For elevated indirect bili, low haptoglobin, elevated LDH      Allergies as of 10/24/2020       Reactions   Adhesive [tape]    Sulfa Antibiotics    UNKNOWN Childhood reaction        Medication List     STOP taking these medications    hydrochlorothiazide 12.5 MG capsule Commonly known as: MICROZIDE   ondansetron 4 MG disintegrating tablet Commonly known as: Zofran  ODT       TAKE these medications    amLODipine 2.5 MG tablet Commonly  known as: NORVASC Take 1 tablet (2.5 mg total) by mouth daily.   magnesium oxide 400 (240 Mg) MG tablet Commonly known as: MAG-OX Take 1 tablet (400 mg total) by mouth 2 (two) times daily.   omeprazole 10 MG capsule Commonly known as: PRILOSEC Take 10 mg by mouth 2 (two) times daily.   potassium chloride SA 20 MEQ tablet Commonly known as: KLOR-CON Take 2 tablets (40 mEq total) by mouth daily. Start taking on: October 25, 2020   rosuvastatin 5 MG tablet Commonly known as: CRESTOR Take 1 tablet by mouth at bedtime.   warfarin 5 MG tablet Commonly known as: COUMADIN Take 1 tablet (5 mg total) by mouth daily at 4 PM. What changed:  when to take this additional instructions        Follow-up Information     Cassandria Anger, MD Follow up on 11/09/2020.   Specialty: Endocrinology Why: at 9:30 am Contact information: [place] [address] [phone]                Allergies  Allergen Reactions   Adhesive [Tape]    Sulfa Antibiotics     UNKNOWN Childhood reaction    Consultations: none   Procedures/Studies: CT ABDOMEN PELVIS W CONTRAST  Result Date: 10/21/2020 CLINICAL DATA:  Diverticulitis suspected, weakness, headache, weak urine stream EXAM: CT ABDOMEN AND PELVIS WITH CONTRAST TECHNIQUE: Multidetector CT imaging of the abdomen and pelvis was performed using the standard protocol following bolus administration of intravenous contrast. CONTRAST:  162m OMNIPAQUE IOHEXOL 300 MG/ML  SOLN COMPARISON:  04/29/2019 FINDINGS: Lower chest: No acute abnormality. Hepatobiliary: No focal liver abnormality is seen. Status post cholecystectomy. No biliary dilatation. Pancreas: Unremarkable. No pancreatic ductal dilatation or surrounding inflammatory changes. Spleen: Normal in size without significant abnormality. Adrenals/Urinary Tract: Adrenal glands are unremarkable. Kidneys are normal, without renal calculi, solid lesion, or hydronephrosis. Bladder is  unremarkable. Stomach/Bowel: Stomach is within normal limits. Appendix is surgically absent. No evidence of bowel wall thickening, distention, or inflammatory changes. Sigmoid diverticula. Vascular/Lymphatic: Aortic atherosclerosis. No enlarged abdominal or pelvic lymph nodes. Reproductive: Prostatomegaly. Other: No abdominal wall hernia or abnormality. No abdominopelvic ascites. Musculoskeletal: No acute osseous findings. Ankylosis of the included thoracic and lumbar spine as well as the bilateral sacroiliac joint. Status post left hip total arthroplasty. IMPRESSION: 1. No acute CT findings of the abdomen or pelvis. 2. Sigmoid diverticulosis without evidence of acute diverticulitis. 3. Prostatomegaly. 4. Ankylosis of the included lower thoracic and lumbar spine as well as the bilateral sacroiliac joints, suggestive of ankylosing spondylitis. Aortic Atherosclerosis (ICD10-I70.0). Electronically Signed   By: AEddie CandleM.D.   On: 10/21/2020 11:24        Discharge Exam: Vitals:   10/23/20 2146 10/24/20 0600  BP: 133/65 136/76  Pulse: (!) 52 (!) 57  Resp: 18 18  Temp: 98.1 F (36.7 C) 98.1 F (36.7 C)  SpO2: 99% 93%   Vitals:   10/23/20 0558 10/23/20 1635 10/23/20 2146 10/24/20 0600  BP: (!) 146/71 127/64 133/65 136/76  Pulse: (!) 59 (!) 58 (!) 52 (!) 57  Resp: 16 18 18 18   Temp: 98.7 F (37.1 C) 97.7 F (36.5 C) 98.1 F (36.7 C) 98.1 F (36.7 C)  TempSrc: Oral Oral Oral Oral  SpO2: 94% 98% 99% 93%  Weight:      Height:  General: Pt is alert, awake, not in acute distress Cardiovascular: RRR, S1/S2 +, no rubs, no gallops Respiratory: CTA bilaterally, no wheezing, no rhonchi Abdominal: Soft, NT, ND, bowel sounds + Extremities: no edema, no cyanosis   The results of significant diagnostics from this hospitalization (including imaging, microbiology, ancillary and laboratory) are listed below for reference.    Significant Diagnostic Studies: CT ABDOMEN PELVIS W  CONTRAST  Result Date: 10/21/2020 CLINICAL DATA:  Diverticulitis suspected, weakness, headache, weak urine stream EXAM: CT ABDOMEN AND PELVIS WITH CONTRAST TECHNIQUE: Multidetector CT imaging of the abdomen and pelvis was performed using the standard protocol following bolus administration of intravenous contrast. CONTRAST:  160m OMNIPAQUE IOHEXOL 300 MG/ML  SOLN COMPARISON:  04/29/2019 FINDINGS: Lower chest: No acute abnormality. Hepatobiliary: No focal liver abnormality is seen. Status post cholecystectomy. No biliary dilatation. Pancreas: Unremarkable. No pancreatic ductal dilatation or surrounding inflammatory changes. Spleen: Normal in size without significant abnormality. Adrenals/Urinary Tract: Adrenal glands are unremarkable. Kidneys are normal, without renal calculi, solid lesion, or hydronephrosis. Bladder is unremarkable. Stomach/Bowel: Stomach is within normal limits. Appendix is surgically absent. No evidence of bowel wall thickening, distention, or inflammatory changes. Sigmoid diverticula. Vascular/Lymphatic: Aortic atherosclerosis. No enlarged abdominal or pelvic lymph nodes. Reproductive: Prostatomegaly. Other: No abdominal wall hernia or abnormality. No abdominopelvic ascites. Musculoskeletal: No acute osseous findings. Ankylosis of the included thoracic and lumbar spine as well as the bilateral sacroiliac joint. Status post left hip total arthroplasty. IMPRESSION: 1. No acute CT findings of the abdomen or pelvis. 2. Sigmoid diverticulosis without evidence of acute diverticulitis. 3. Prostatomegaly. 4. Ankylosis of the included lower thoracic and lumbar spine as well as the bilateral sacroiliac joints, suggestive of ankylosing spondylitis. Aortic Atherosclerosis (ICD10-I70.0). Electronically Signed   By: AEddie CandleM.D.   On: 10/21/2020 11:24    Microbiology: Recent Results (from the past 240 hour(s))  Resp Panel by RT-PCR (Flu A&B, Covid) Nasopharyngeal Swab     Status: None   Collection  Time: 10/21/20  9:29 AM   Specimen: Nasopharyngeal Swab; Nasopharyngeal(NP) swabs in vial transport medium  Result Value Ref Range Status   SARS Coronavirus 2 by RT PCR NEGATIVE NEGATIVE Final    Comment: (NOTE) SARS-CoV-2 target nucleic acids are NOT DETECTED.  The SARS-CoV-2 RNA is generally detectable in upper respiratory specimens during the acute phase of infection. The lowest concentration of SARS-CoV-2 viral copies this assay can detect is 138 copies/mL. A negative result does not preclude SARS-Cov-2 infection and should not be used as the sole basis for treatment or other patient management decisions. A negative result may occur with  improper specimen collection/handling, submission of specimen other than nasopharyngeal swab, presence of viral mutation(s) within the areas targeted by this assay, and inadequate number of viral copies(<138 copies/mL). A negative result must be combined with clinical observations, patient history, and epidemiological information. The expected result is Negative.  Fact Sheet for Patients:  hEntrepreneurPulse.com.au Fact Sheet for Healthcare Providers:  hIncredibleEmployment.be This test is no t yet approved or cleared by the [place] and  has been authorized for detection and/or diagnosis of SARS-CoV-2 by FDA under an Emergency Use Authorization (EUA). This EUA will remain  in effect (meaning this test can be used) for the duration of the COVID-19 declaration under Section 564(b)(1) of the Act, 21 U.S.C.section 360bbb-3(b)(1), unless the authorization is terminated  or revoked sooner.       Influenza A by PCR NEGATIVE NEGATIVE Final   Influenza B by PCR NEGATIVE NEGATIVE  Final    Comment: (NOTE) The Xpert Xpress SARS-CoV-2/FLU/RSV plus assay is intended as an aid in the diagnosis of influenza from Nasopharyngeal swab specimens and should not be used as a sole basis for treatment. Nasal washings  and aspirates are unacceptable for Xpert Xpress SARS-CoV-2/FLU/RSV testing.  Fact Sheet for Patients: EntrepreneurPulse.com.au  Fact Sheet for Healthcare Providers: IncredibleEmployment.be  This test is not yet approved or cleared by the [place] FDA and has been authorized for detection and/or diagnosis of SARS-CoV-2 by FDA under an Emergency Use Authorization (EUA). This EUA will remain in effect (meaning this test can be used) for the duration of the COVID-19 declaration under Section 564(b)(1) of the Act, 21 U.S.C. section 360bbb-3(b)(1), unless the authorization is terminated or revoked.  Performed at [facility], [address] [place], [place] [address]   Culture, Urine     Status: Abnormal   Collection Time: 10/21/20 12:48 PM   Specimen: Urine, Clean Catch  Result Value Ref Range Status   Specimen Description   Final    URINE, CLEAN CATCH Performed at [facility], [address] [place], [place] [address]    Special Requests   Final    NONE Performed at [facility], [address], [place], [place] [address]    Culture (A)  Final    <10,000 COLONIES/mL INSIGNIFICANT GROWTH Performed at [facility] [address] [place], [place] [address]    Report Status 10/23/2020 FINAL  Final     Labs: Basic Metabolic Panel: Recent Labs  Lab 10/21/20 0928 10/21/20 1226 10/22/20 0616 10/23/20 0613 10/24/20 0607  NA 138  --  138 139 137  K 2.3*  --  2.6* 2.9* 3.4*  CL 96*  --  99 101 103  CO2 31  --  28 29 25   GLUCOSE 106*  --  89 93 87  BUN 15  --  13 16 12   CREATININE 0.78  --  0.62 0.64 0.63  CALCIUM 8.9  --  8.9 9.2 9.0  MG 1.6* 1.7 1.8  --  1.4*   Liver Function Tests: Recent Labs  Lab 10/21/20 0928 10/21/20 1226 10/22/20 0616 10/23/20 0613  AST 66*  --  66* 73*  ALT 19  --  19 20  ALKPHOS 57  --  52 51  BILITOT 3.2* 2.8* 2.3* 1.7*  PROT 6.9  --  6.5 6.4*  ALBUMIN 3.6  --  3.4* 3.3*   Recent Labs   Lab 10/21/20 0928  LIPASE 26   No results for input(s): AMMONIA in the last 168 hours. CBC: Recent Labs  Lab 10/21/20 0928 10/22/20 0616 10/23/20 0613  WBC 5.0 4.2 4.4  NEUTROABS 2.0  --   --   HGB 17.3* 15.9 15.7  HCT 47.3 45.3 44.6  MCV 86.5 88.0 88.1  PLT 216 220 213   Cardiac Enzymes: No results for input(s): CKTOTAL, CKMB, CKMBINDEX, TROPONINI in the last 168 hours. BNP: Invalid input(s): POCBNP CBG: No results for input(s): GLUCAP in the last 168 hours.  Time coordinating discharge:  36 minutes  Signed:  [name], DO [facility] Pager: [phone] 10/24/2020, 1:40 PM

## 2020-10-25 LAB — THYROID STIMULATING IMMUNOGLOBULIN: Thyroid Stimulating Immunoglob: <0.1 IU/L (ref 0.00–0.55)

## 2020-10-25 LAB — THYROID PEROXIDASE ANTIBODY: Thyroperoxidase Ab SerPl-aCnc: <8 IU/mL (ref 0–34)

## 2020-10-25 LAB — HAPTOGLOBIN: Haptoglobin: 21 mg/dL — ABNORMAL LOW (ref 32–363)

## 2020-10-25 LAB — THYROGLOBULIN ANTIBODY: Thyroglobulin Antibody: <1 IU/mL (ref 0.0–0.9)

## 2020-11-03 ENCOUNTER — Ambulatory Visit (HOSPITAL_COMMUNITY): Payer: Medicare Other | Admitting: Hematology and Oncology

## 2020-11-04 ENCOUNTER — Ambulatory Visit (HOSPITAL_COMMUNITY): Payer: Medicare Other | Admitting: Hematology and Oncology

## 2020-11-08 ENCOUNTER — Encounter (HOSPITAL_COMMUNITY): Payer: Self-pay | Admitting: Surgery

## 2020-11-08 ENCOUNTER — Other Ambulatory Visit: Payer: Self-pay

## 2020-11-09 ENCOUNTER — Ambulatory Visit: Payer: Medicare Other | Admitting: Nurse Practitioner

## 2020-11-09 ENCOUNTER — Encounter: Payer: Self-pay | Admitting: Nurse Practitioner

## 2020-11-09 VITALS — BP 113/64 | HR 87 | Ht 72.0 in | Wt 264.0 lb

## 2020-11-09 DIAGNOSIS — E0590 Thyrotoxicosis, unspecified without thyrotoxic crisis or storm: Secondary | ICD-10-CM

## 2020-11-09 DIAGNOSIS — R7989 Other specified abnormal findings of blood chemistry: Secondary | ICD-10-CM | POA: Diagnosis not present

## 2020-11-09 NOTE — Patient Instructions (Signed)
Hyperthyroidism  Hyperthyroidism is when the thyroid gland is too active (overactive). The thyroid gland is a small gland located in the lower front part of the neck, just in front of the windpipe (trachea). This gland makes hormones that help control how the body uses food for energy (metabolism) as well as how the heart and brain function. These hormones also play a role in keeping your bones strong. When the thyroid is overactive, it produces toomuch of a hormone called thyroxine. What are the causes? This condition may be caused by: Graves' disease. This is a disorder in which the body's disease-fighting system (immune system) attacks the thyroid gland. This is the most common cause. Inflammation of the thyroid gland. A tumor in the thyroid gland. Use of certain medicines, including: Prescription thyroid hormone replacement. Herbal supplements that mimic thyroid hormones. Amiodarone therapy. Solid or fluid-filled lumps within your thyroid gland (thyroid nodules). Taking in a large amount of iodine from foods or medicines. What increases the risk? You are more likely to develop this condition if: You are male. You have a family history of thyroid conditions. You smoke tobacco. You use a medicine called lithium. You take medicines that affect the immune system (immunosuppressants). What are the signs or symptoms? Symptoms of this condition include: Nervousness. Inability to tolerate heat. Unexplained weight loss. Diarrhea. Change in the texture of hair or skin. Heart skipping beats or making extra beats. Rapid heart rate. Loss of menstruation. Shaky hands. Fatigue. Restlessness. Sleep problems. Enlarged thyroid gland or a lump in the thyroid (nodule). You may also have symptoms of Graves' disease, which may include: Protruding eyes. Dry eyes. Red or swollen eyes. Problems with vision. How is this diagnosed? This condition may be diagnosed based on: Your symptoms and  medical history. A physical exam. Blood tests. Thyroid ultrasound. This test involves using sound waves to produce images of the thyroid gland. A thyroid scan. A radioactive substance is injected into a vein, and images show how much iodine is present in the thyroid. Radioactive iodine uptake test (RAIU). A small amount of radioactive iodine is given by mouth to see how much iodine the thyroid absorbs after a certain amount of time. How is this treated? Treatment depends on the cause and severity of the condition. Treatment may include: Medicines to reduce the amount of thyroid hormone your body makes. Radioactive iodine treatment (radioiodine therapy). This involves swallowing a small dose of radioactive iodine, in capsule or liquid form, to kill thyroid cells. Surgery to remove part or all of your thyroid gland. You may need to take thyroid hormone replacement medicine for the rest of your life after thyroid surgery. Medicines to help manage your symptoms. Follow these instructions at home:  Take over-the-counter and prescription medicines only as told by your health care provider. Do not use any products that contain nicotine or tobacco, such as cigarettes and e-cigarettes. If you need help quitting, ask your health care provider. Follow any instructions from your health care provider about diet. You may be instructed to limit foods that contain iodine. Keep all follow-up visits as told by your health care provider. This is important. You will need to have blood tests regularly so that your health care provider can monitor your condition. Contact a health care provider if: Your symptoms do not get better with treatment. You have a fever. You are taking thyroid hormone replacement medicine and you: Have symptoms of depression. Feel like you are tired all the time. Gain weight. Get help right  away if: You have chest pain. You have decreased alertness or a change in your awareness. You  have abdominal pain. You feel dizzy. You have a rapid heartbeat. You have an irregular heartbeat. You have difficulty breathing. Summary The thyroid gland is a small gland located in the lower front part of the neck, just in front of the windpipe (trachea). Hyperthyroidism is when the thyroid gland is too active (overactive) and produces too much of a hormone called thyroxine. The most common cause is Graves' disease, a disorder in which your immune system attacks the thyroid gland. Hyperthyroidism can cause various symptoms, such as unexplained weight loss, nervousness, inability to tolerate heat, or changes in your heartbeat. Treatment may include medicine to reduce the amount of thyroid hormone your body makes, radioiodine therapy, surgery, or medicines to manage symptoms. This information is not intended to replace advice given to you by your health care provider. Make sure you discuss any questions you have with your healthcare provider. Document Revised: 01/07/2020 Document Reviewed: 01/07/2020 Elsevier Patient Education  2022 [employer].

## 2020-11-09 NOTE — Progress Notes (Signed)
11/09/2020     Endocrinology Consult Note    Subjective:    Patient ID: Roy [name], male    DOB: 23-Apr-1951, PCP Lissa Hoard Karie Schwalbe, NP.   Past Medical History:  Diagnosis Date   Atrial fibrillation (HCC)    GERD (gastroesophageal reflux disease)    High cholesterol    Hypertension    Obesity     Past Surgical History:  Procedure Laterality Date   APPENDECTOMY     CATARACT EXTRACTION Bilateral 2022   CHOLECYSTECTOMY     ESOPHAGEAL DILATION     multiple times   JOINT REPLACEMENT Left    hip    Social History   Socioeconomic History   Marital status: Widowed    Spouse name: Not on file   Number of children: 3   Years of education: Not on file   Highest education level: Not on file  Occupational History   Occupation: Retired  Tobacco Use   Smoking status: Never   Smokeless tobacco: Never  Vaping Use   Vaping Use: Never used  Substance and Sexual Activity   Alcohol use: No   Drug use: No   Sexual activity: Not Currently  Other Topics Concern   Not on file  Social History Narrative   Not on file   Social Determinants of Health   Financial Resource Strain: Low Risk    Difficulty of Paying Living Expenses: Not hard at all  Food Insecurity: No Food Insecurity   Worried About Charity fundraiser in the Last Year: Never true   [place] in the Last Year: Never true  Transportation Needs: No Transportation Needs   Lack of Transportation (Medical): No   Lack of Transportation (Non-Medical): No  Physical Activity: Sufficiently Active   Days of Exercise per Week: 5 days   Minutes of Exercise per Session: 30 min  Stress: No Stress Concern Present   Feeling of Stress : Not at all  Social Connections: Moderately Isolated   Frequency of Communication with Friends and Family: More than three times a week   Frequency of Social Gatherings with Friends and Family: Three times a week   Attends Religious Services: 1 to 4 times per year   Active  Member of Clubs or Organizations: No   Attends Archivist Meetings: Never   Marital Status: Widowed    Family History  Problem Relation Age of Onset   Heart failure Father    Heart attack Father        Deceased    Thyroid disease Sister     Outpatient Encounter Medications as of 11/09/2020  Medication Sig   amLODipine (NORVASC) 2.5 MG tablet Take 1 tablet (2.5 mg total) by mouth daily.   Calcium Carbonate-Vitamin D (CALCIUM 600+D PO) Take by mouth daily.   Cholecalciferol (VITAMIN D3) 25 MCG (1000 UT) CAPS Take by mouth daily.   magnesium oxide (MAG-OX) 400 (240 Mg) MG tablet Take 1 tablet (400 mg total) by mouth 2 (two) times daily.   omeprazole (PRILOSEC) 10 MG capsule Take 10 mg by mouth daily. Usually once a day, sometimes twice   potassium chloride SA (KLOR-CON) 20 MEQ tablet Take 2 tablets (40 mEq total) by mouth daily.   rosuvastatin (CRESTOR) 5 MG tablet Take 1 tablet by mouth at bedtime.   warfarin (COUMADIN) 5 MG tablet Take 1 tablet (5 mg total) by mouth daily at 4 PM.   No facility-administered encounter medications on file as  of 11/09/2020.    ALLERGIES: Allergies  Allergen Reactions   Adhesive [Tape]    Sulfa Antibiotics     UNKNOWN Childhood reaction    VACCINATION STATUS: Immunization History  Administered Date(s) Administered   Influenza,inj,Quad PF,6+ Mos 04/28/2015   Moderna Sars-Covid-2 Vaccination 07/03/2019, 07/31/2019   PFIZER(Purple Top)SARS-COV-2 Vaccination 03/25/2020   Pneumococcal Polysaccharide-23 04/28/2015     HPI  Roy [name] is 70 y.o. male who presents today with a medical history as above. he is being seen in consultation for hyperthyroidism requested by Bridget Hartshorn, NP.  he has been dealing with symptoms of palpitations (has afib), anxiety, jitteriness, heat intolerance, and unexplained weight loss for about 1 month. These symptoms are progressively worsening and troubling to him.  his most recent thyroid labs  revealed suppressed TSH of 0.03 and high Free T4 of 1.75 on 10/22/20 during a recent hospitalization for his symptoms.  They did perform antibody testing for autoimmune thyroid dysfunction which was negative.  he denies dysphagia, choking, shortness of breath, no recent voice change.    he denies family history of thyroid dysfunction and denies family hx of thyroid cancer. he denies personal history of goiter. he is not on any anti-thyroid medications nor on any thyroid hormone supplements. Denies use of Biotin containing supplements. He has never had any imaging of his thyroid done in the past.   he is willing to proceed with appropriate work up and therapy for thyrotoxicosis.   Review of systems  Constitutional: + reports recent unintentional weight loss (has gained some back),  current Body mass index is 35.8 kg/m., no fatigue, + subjective hyperthermia, no subjective hypothermia Eyes: no blurry vision, no xerophthalmia ENT: no sore throat, no nodules palpated in throat, no dysphagia/odynophagia, no hoarseness Cardiovascular: no chest pain, no shortness of breath, + palpitations (has hx of afib), no leg swelling Respiratory: no cough, no shortness of breath Gastrointestinal: no nausea/vomiting/diarrhea, does have constipation Musculoskeletal: no muscle/joint aches Skin: no rashes, no hyperemia Neurological: no tremors, no numbness, no tingling, no dizziness Psychiatric: no depression, + anxiety   Objective:    BP 113/64   Pulse 87   Ht 6' (1.829 m)   Wt 264 lb (119.7 kg)   BMI 35.80 kg/m   Wt Readings from Last 3 Encounters:  11/09/20 264 lb (119.7 kg)  10/21/20 269 lb (122 kg)  04/29/19 248 lb (112.5 kg)     BP Readings from Last 3 Encounters:  11/09/20 113/64  10/24/20 136/76  04/29/19 137/80                         Physical Exam- Limited  Constitutional:  Body mass index is 35.8 kg/m. , not in acute distress, normal state of mind Eyes:  EOMI, no  exophthalmos Neck: Supple Thyroid: No gross goiter Cardiovascular: Irregular rhythm- hx afib, no murmurs, rubs, or gallops, no edema Respiratory: Adequate breathing efforts, no crackles, rales, rhonchi, or wheezing Musculoskeletal: no gross deformities, strength intact in all four extremities, no gross restriction of joint movements Skin:  no rashes, no hyperemia Neurological: no tremor with outstretched hands, DTR normal in BLE   CMP     Component Value Date/Time   NA 137 10/24/2020 0607   K 3.4 (L) 10/24/2020 0607   CL 103 10/24/2020 0607   CO2 25 10/24/2020 0607   GLUCOSE 87 10/24/2020 0607   BUN 12 10/24/2020 0607   CREATININE 0.63 10/24/2020 0607   CALCIUM 9.0 10/24/2020  0607   PROT 6.4 (L) 10/23/2020 0613   ALBUMIN 3.3 (L) 10/23/2020 0613   AST 73 (H) 10/23/2020 0613   ALT 20 10/23/2020 0613   ALKPHOS 51 10/23/2020 0613   BILITOT 1.7 (H) 10/23/2020 0613   GFRNONAA >60 10/24/2020 0607   GFRAA >60 04/29/2019 1033     CBC    Component Value Date/Time   WBC 4.4 10/23/2020 0613   RBC 5.06 10/23/2020 0613   HGB 15.7 10/23/2020 0613   HCT 44.6 10/23/2020 0613   PLT 213 10/23/2020 0613   MCV 88.1 10/23/2020 0613   MCH 31.0 10/23/2020 0613   MCHC 35.2 10/23/2020 0613   RDW 14.6 10/23/2020 0613   LYMPHSABS 2.6 10/21/2020 0928   MONOABS 0.3 10/21/2020 0928   EOSABS 0.2 10/21/2020 0928   BASOSABS 0.1 10/21/2020 0928     Diabetic Labs (most recent): No results found for: HGBA1C  Lipid Panel     Component Value Date/Time   CHOL 147 04/29/2015 0426   TRIG 153 (H) 04/29/2015 0426   HDL 19 (L) 04/29/2015 0426   CHOLHDL 7.7 04/29/2015 0426   VLDL 31 04/29/2015 0426   LDLCALC 97 04/29/2015 0426     Lab Results  Component Value Date   TSH 0.030 (L) 10/21/2020   TSH 3.836 03/17/2018   TSH 6.505 (H) 04/27/2015   TSH 10.503 (H) 04/27/2015   FREET4 1.75 (H) 10/21/2020   FREET4 0.98 03/17/2018   FREET4 0.93 04/28/2015     Results for JOWEL, WALTNER (MRN  [id number]) as of 11/09/2020 11:28  Ref. Range 04/27/2015 16:39 04/28/2015 17:22 03/17/2018 09:04 10/21/2020 12:26 10/22/2020 15:38  TSH Latest Ref Range: 0.350 - 4.500 uIU/mL 6.505 (H)  3.836 0.030 (L)   Triiodothyronine (T3) Latest Ref Range: 71 - 180 ng/dL  149     T4,Free(Direct) Latest Ref Range: 0.61 - 1.12 ng/dL  0.93 0.98 1.75 (H)   Thyroperoxidase Ab SerPl-aCnc Latest Ref Range: 0 - 34 IU/mL     <8  Thyroglobulin Antibody Latest Ref Range: 0.0 - 0.9 IU/mL     <1.0  THYROID STIMULATING IMMUNOGLOBULIN Unknown     Rpt     Assessment & Plan:   1. Abnormal TSH- Hyperthyroidism?  he is being seen at a kind request of Hemberg, Karie Schwalbe, NP.  his history and most recent labs are reviewed, and he was examined clinically. Subjective and objective findings are consistent with thyrotoxicosis but more information is needed to determine the etiology. The potential risks of untreated thyrotoxicosis and the need for definitive therapy have been discussed in detail with him, and he agrees to proceed with diagnostic workup and treatment plan.   I will repeat full profile thyroid function tests today and confirmatory thyroid uptake and scan will be scheduled to be done as soon as possible.   Options of therapy are discussed with him.  We discussed the option of treating it with medications including methimazole or PTU which may have side effects including rash, transaminitis, and bone marrow suppression.  We also discussed the option of definitive therapy with RAI ablation of the thyroid. If he is found to have primary hyperthyroidism from Graves' disease , toxic multinodular goiter or toxic nodular goiter the preferred modality of treatment would be I-131 thyroid ablation. Surgery is another choice of treatment in some cases, in his case surgery is not a good fit for presentation with only mild goiter.  -Patient is made aware of the high likelihood of post ablative hypothyroidism with  subsequent need for  lifelong thyroid hormone replacement. heunderstands this outcome and he is  willing to proceed.      he will return in 3 weeks for treatment decision.   I did not initiate any new prescriptions for him today.  HR was stable at 87.    -Patient is advised to maintain close follow up with Hemberg, Karie Schwalbe, NP for primary care needs.   - Time spent with the patient: 60 minutes, of which >50% was spent in obtaining information about his symptoms, reviewing his previous labs, evaluations, and treatments, counseling him about his hyperthyroidism , and developing a plan to confirm the diagnosis and long term treatment as necessary. Please refer to "Patient Self Inventory" in the Media tab for reviewed elements of pertinent patient history.  Roy [name] participated in the discussions, expressed understanding, and voiced agreement with the above plans.  All questions were answered to his satisfaction. he is encouraged to contact clinic should he have any questions or concerns prior to his return visit.   Follow up plan: Return in about 3 weeks (around 11/30/2020) for Thyroid follow up, Previsit labs, uptake and scan.   Thank you for involving me in the care of this pleasant patient, and I will continue to update you with his progress.    Rayetta Pigg, [facility] [address] [place], [place] [address] Phone: [phone] Fax: [phone]  11/09/2020, 11:22 AM

## 2020-11-10 LAB — T3, FREE: T3, Free: 3.7 pg/mL (ref 2.0–4.4)

## 2020-11-10 LAB — TSH: TSH: 0.067 u[IU]/mL — ABNORMAL LOW (ref 0.450–4.500)

## 2020-11-10 LAB — T4, FREE: Free T4: 1.12 ng/dL (ref 0.82–1.77)

## 2020-11-11 ENCOUNTER — Inpatient Hospital Stay (HOSPITAL_COMMUNITY): Payer: Medicare Other

## 2020-11-11 ENCOUNTER — Inpatient Hospital Stay (HOSPITAL_COMMUNITY): Payer: Medicare Other | Attending: Hematology and Oncology | Admitting: Hematology and Oncology

## 2020-11-11 ENCOUNTER — Encounter (HOSPITAL_COMMUNITY): Payer: Self-pay | Admitting: Hematology and Oncology

## 2020-11-11 ENCOUNTER — Other Ambulatory Visit: Payer: Self-pay

## 2020-11-11 VITALS — BP 119/63 | HR 74 | Temp 97.9°F | Resp 16 | Ht 72.0 in | Wt 264.1 lb

## 2020-11-11 DIAGNOSIS — D591 Autoimmune hemolytic anemia, unspecified: Secondary | ICD-10-CM

## 2020-11-11 DIAGNOSIS — R5383 Other fatigue: Secondary | ICD-10-CM | POA: Insufficient documentation

## 2020-11-11 DIAGNOSIS — K5900 Constipation, unspecified: Secondary | ICD-10-CM | POA: Insufficient documentation

## 2020-11-11 DIAGNOSIS — I10 Essential (primary) hypertension: Secondary | ICD-10-CM | POA: Diagnosis not present

## 2020-11-11 DIAGNOSIS — R945 Abnormal results of liver function studies: Secondary | ICD-10-CM | POA: Insufficient documentation

## 2020-11-11 DIAGNOSIS — K760 Fatty (change of) liver, not elsewhere classified: Secondary | ICD-10-CM | POA: Diagnosis not present

## 2020-11-11 DIAGNOSIS — E785 Hyperlipidemia, unspecified: Secondary | ICD-10-CM | POA: Insufficient documentation

## 2020-11-11 DIAGNOSIS — I480 Paroxysmal atrial fibrillation: Secondary | ICD-10-CM | POA: Insufficient documentation

## 2020-11-11 LAB — RETICULOCYTES
Immature Retic Fract: 11.7 % (ref 2.3–15.9)
RBC.: 5.59 MIL/uL (ref 4.22–5.81)
Retic Count, Absolute: 148.1 10*3/uL (ref 19.0–186.0)
Retic Ct Pct: 2.7 % (ref 0.4–3.1)

## 2020-11-11 LAB — CMP (CANCER CENTER ONLY)
ALT: 21 U/L (ref 0–44)
AST: 74 U/L — ABNORMAL HIGH (ref 15–41)
Albumin: 3.9 g/dL (ref 3.5–5.0)
Alkaline Phosphatase: 58 U/L (ref 38–126)
Anion gap: 11 (ref 5–15)
BUN: 12 mg/dL (ref 8–23)
CO2: 23 mmol/L (ref 22–32)
Calcium: 9.5 mg/dL (ref 8.9–10.3)
Chloride: 101 mmol/L (ref 98–111)
Creatinine: 0.82 mg/dL (ref 0.61–1.24)
GFR, Estimated: >60 mL/min (ref >60)
Glucose, Bld: 105 mg/dL — ABNORMAL HIGH (ref 70–99)
Potassium: 4.1 mmol/L (ref 3.5–5.1)
Sodium: 135 mmol/L (ref 135–145)
Total Bilirubin: 2.3 mg/dL — ABNORMAL HIGH (ref 0.3–1.2)
Total Protein: 7.1 g/dL (ref 6.5–8.1)

## 2020-11-11 LAB — HEPATITIS PANEL, ACUTE
HCV Ab: NONREACTIVE
Hep A IgM: NONREACTIVE
Hep B C IgM: NONREACTIVE
Hepatitis B Surface Ag: NONREACTIVE

## 2020-11-11 LAB — CBC WITH DIFFERENTIAL/PLATELET
Abs Immature Granulocytes: 0.01 10*3/uL (ref 0.00–0.07)
Basophils Absolute: 0.1 10*3/uL (ref 0.0–0.1)
Basophils Relative: 1 %
Eosinophils Absolute: 0.1 10*3/uL (ref 0.0–0.5)
Eosinophils Relative: 2 %
HCT: 50.2 % (ref 39.0–52.0)
Hemoglobin: 17.3 g/dL — ABNORMAL HIGH (ref 13.0–17.0)
Immature Granulocytes: 0 %
Lymphocytes Relative: 45 %
Lymphs Abs: 1.8 10*3/uL (ref 0.7–4.0)
MCH: 31.3 pg (ref 26.0–34.0)
MCHC: 34.5 g/dL (ref 30.0–36.0)
MCV: 90.8 fL (ref 80.0–100.0)
Monocytes Absolute: 0.3 10*3/uL (ref 0.1–1.0)
Monocytes Relative: 8 %
Neutro Abs: 1.8 10*3/uL (ref 1.7–7.7)
Neutrophils Relative %: 44 %
Platelets: 234 10*3/uL (ref 150–400)
RBC: 5.53 MIL/uL (ref 4.22–5.81)
RDW: 13.5 % (ref 11.5–15.5)
WBC: 4.1 10*3/uL (ref 4.0–10.5)
nRBC: 0 % (ref 0.0–0.2)

## 2020-11-11 LAB — IRON AND TIBC
Iron: 151 ug/dL (ref 45–182)
Saturation Ratios: 41 % — ABNORMAL HIGH (ref 17.9–39.5)
TIBC: 369 ug/dL (ref 250–450)
UIBC: 218 ug/dL

## 2020-11-11 LAB — BILIRUBIN, DIRECT: Bilirubin, Direct: 0.4 mg/dL — ABNORMAL HIGH (ref 0.0–0.2)

## 2020-11-11 LAB — LACTATE DEHYDROGENASE: LDH: 399 U/L — ABNORMAL HIGH (ref 98–192)

## 2020-11-11 NOTE — Progress Notes (Signed)
[facility] NOTE  Patient Care Team: Bridget Hartshorn, NP as PCP - General (Adult Health Nurse Practitioner)  CHIEF COMPLAINTS/PURPOSE OF CONSULTATION:  Concern for hemolysis.  ASSESSMENT & PLAN:  This is a very pleasant 70 year old male patient with medical history significant for hypertension, dyslipidemia, atrial fibrillation referred to hematology for evaluation of nonimmune mediated hemolysis without anemia.  He was found to have high LDH, high bilirubin, mostly indirect but mildly elevated, high reticulocyte count without anemia noted on lab evaluation during the hospitalization.  He denies any new complaints, his weakness has resolved.  Physical examination today unremarkable. We have reviewed his older labs which did not suggest evidence of heritable or genetic causes for it.  No concern for an active infection at this time.  We have discussed about any new medications that could have caused it, patient denies any new medications, herbal supplements or other vitamins.. No foreign devices in the body that could cause fragmentation.   Liver disease can cause hemolysis in certain patients, he also has elevated AST hence this is a possibility.  I have also ordered a P[facility] panel today. He will return to clinic in a couple weeks to review the above-mentioned labs and to discuss any additional recommendations.  At this time the degree of hemolysis appears to be very mild and hence there is no concern for transfusion, or immediate treatment.  Thank you for consulting [place] in the care of this patient.  Please do not hesitate to contact us with any additional questions or concerns.  HISTORY OF PRESENTING ILLNESS:   Roy [name] 70 y.o. male is here because of concern for hemolysis without overt anemia.  This is a pleasant 70 year old male patient with past medical history significant for paroxysmal atrial fibrillation, hypertension, dyslipidemia who presented to the  hospital with generalized weakness, decreased oral intake.  He also noted some intermittent left lower quadrant pain worsened with certain movements.  He was evaluated with CT abdomen pelvis which was unremarkable, no other concerns on blood counts except for mildly elevated bilirubin, low haptoglobin and concern for hemolysis.  He was thought to have hypokalemia and related generalized weakness. He was also found to have indirect bilirubin, elevated LDH, low haptoglobin and negative Coombs test, mildly elevated reticulocyte count He denies any complaints, his weakness has significantly improved since his hospital discharge.  He denies any new medications, prior history of nutritional deficiencies, his thyroid numbers being managed currently by his other doctor.  He denies any recent viral infections, known hemolytic anemia in the past.  He feels well.  Rest of the pertinent 10 point ROS reviewed and negative.  REVIEW OF SYSTEMS:   Constitutional: Denies fevers, chills or abnormal night sweats Eyes: Denies blurriness of vision, double vision or watery eyes Ears, nose, mouth, throat, and face: Denies mucositis or sore throat Respiratory: Denies cough, dyspnea or wheezes Cardiovascular: Denies palpitation, chest discomfort or lower extremity swelling Gastrointestinal:  Denies nausea, heartburn or change in bowel habits Skin: Denies abnormal skin rashes Lymphatics: Denies new lymphadenopathy or easy bruising Neurological:Denies numbness, tingling or new weaknesses Behavioral/Psych: Mood is stable, no new changes  All other systems were reviewed with the patient and are negative.  MEDICAL HISTORY:  Past Medical History:  Diagnosis Date   Atrial fibrillation (HCC)    GERD (gastroesophageal reflux disease)    High cholesterol    Hypertension    Obesity     SURGICAL HISTORY: Past Surgical History:  Procedure Laterality Date  APPENDECTOMY     CATARACT EXTRACTION Bilateral 2022    CHOLECYSTECTOMY     ESOPHAGEAL DILATION     multiple times   JOINT REPLACEMENT Left    hip    SOCIAL HISTORY: Social History   Socioeconomic History   Marital status: Widowed    Spouse name: Not on file   Number of children: 3   Years of education: Not on file   Highest education level: Not on file  Occupational History   Occupation: Retired  Tobacco Use   Smoking status: Never   Smokeless tobacco: Never  Vaping Use   Vaping Use: Never used  Substance and Sexual Activity   Alcohol use: No   Drug use: No   Sexual activity: Not Currently  Other Topics Concern   Not on file  Social History Narrative   Not on file   Social Determinants of Health   Financial Resource Strain: Low Risk    Difficulty of Paying Living Expenses: Not hard at all  Food Insecurity: No Food Insecurity   Worried About Charity fundraiser in the Last Year: Never true   [place] in the Last Year: Never true  Transportation Needs: No Transportation Needs   Lack of Transportation (Medical): No   Lack of Transportation (Non-Medical): No  Physical Activity: Sufficiently Active   Days of Exercise per Week: 5 days   Minutes of Exercise per Session: 30 min  Stress: No Stress Concern Present   Feeling of Stress : Not at all  Social Connections: Moderately Isolated   Frequency of Communication with Friends and Family: More than three times a week   Frequency of Social Gatherings with Friends and Family: Three times a week   Attends Religious Services: 1 to 4 times per year   Active Member of Clubs or Organizations: No   Attends Archivist Meetings: Never   Marital Status: Widowed  Human resources officer Violence: Not At Risk   Fear of Current or Ex-Partner: No   Emotionally Abused: No   Physically Abused: No   Sexually Abused: No    FAMILY HISTORY: Family History  Problem Relation Age of Onset   Heart failure Father    Heart attack Father        Deceased    Thyroid disease Sister      ALLERGIES:  is allergic to adhesive [tape] and sulfa antibiotics.  MEDICATIONS:  Current Outpatient Medications  Medication Sig Dispense Refill   amLODipine (NORVASC) 2.5 MG tablet Take 1 tablet (2.5 mg total) by mouth daily. 30 tablet 1   Calcium Carbonate-Vitamin D (CALCIUM 600+D PO) Take by mouth daily.     Cholecalciferol (VITAMIN D3) 25 MCG (1000 UT) CAPS Take by mouth daily.     magnesium oxide (MAG-OX) 400 (240 Mg) MG tablet Take 1 tablet (400 mg total) by mouth 2 (two) times daily. 60 tablet 1   omeprazole (PRILOSEC) 10 MG capsule Take 10 mg by mouth daily. Usually once a day, sometimes twice     potassium chloride SA (KLOR-CON) 20 MEQ tablet Take 2 tablets (40 mEq total) by mouth daily. 20 tablet 0   rosuvastatin (CRESTOR) 5 MG tablet Take 1 tablet by mouth at bedtime.     warfarin (COUMADIN) 5 MG tablet Take 1 tablet (5 mg total) by mouth daily at 4 PM.     No current facility-administered medications for this visit.     PHYSICAL EXAMINATION:  ECOG PERFORMANCE STATUS: 0 - Asymptomatic  Vitals:   11/11/20 0939  BP: 119/63  Pulse: 74  Resp: 16  Temp: 97.9 F (36.6 C)  SpO2: 95%   Filed Weights   11/11/20 0939  Weight: 264 lb 1.6 oz (119.8 kg)    GENERAL:alert, no distress and comfortable SKIN: skin color, texture, turgor are normal, no rashes or significant lesions EYES: normal, conjunctiva are pink and non-injected, sclera clear OROPHARYNX:no exudate, no erythema and lips, buccal mucosa, and tongue normal  NECK: supple, thyroid normal size, non-tender, without nodularity LYMPH:  no palpable lymphadenopathy in the cervical, axillary or inguinal LUNGS: clear to auscultation and percussion with normal breathing effort HEART: regular rate & rhythm and no murmurs and no lower extremity edema ABDOMEN:abdomen soft, non-tender and normal bowel sounds Musculoskeletal:no cyanosis of digits and no clubbing  PSYCH: alert & oriented x 3 with fluent speech NEURO: no  focal motor/sensory deficits  LABORATORY DATA:  I have reviewed the data as listed Lab Results  Component Value Date   WBC 4.4 10/23/2020   HGB 15.7 10/23/2020   HCT 44.6 10/23/2020   MCV 88.1 10/23/2020   PLT 213 10/23/2020     Chemistry      Component Value Date/Time   NA 137 10/24/2020 0607   K 3.4 (L) 10/24/2020 0607   CL 103 10/24/2020 0607   CO2 25 10/24/2020 0607   BUN 12 10/24/2020 0607   CREATININE 0.63 10/24/2020 0607      Component Value Date/Time   CALCIUM 9.0 10/24/2020 0607   ALKPHOS 51 10/23/2020 0613   AST 73 (H) 10/23/2020 0613   ALT 20 10/23/2020 0613   BILITOT 1.7 (H) 10/23/2020 4401       RADIOGRAPHIC STUDIES: I have personally reviewed the radiological images as listed and agreed with the findings in the report. CT ABDOMEN PELVIS W CONTRAST  Result Date: 10/21/2020 CLINICAL DATA:  Diverticulitis suspected, weakness, headache, weak urine stream EXAM: CT ABDOMEN AND PELVIS WITH CONTRAST TECHNIQUE: Multidetector CT imaging of the abdomen and pelvis was performed using the standard protocol following bolus administration of intravenous contrast. CONTRAST:  128mL OMNIPAQUE IOHEXOL 300 MG/ML  SOLN COMPARISON:  04/29/2019 FINDINGS: Lower chest: No acute abnormality. Hepatobiliary: No focal liver abnormality is seen. Status post cholecystectomy. No biliary dilatation. Pancreas: Unremarkable. No pancreatic ductal dilatation or surrounding inflammatory changes. Spleen: Normal in size without significant abnormality. Adrenals/Urinary Tract: Adrenal glands are unremarkable. Kidneys are normal, without renal calculi, solid lesion, or hydronephrosis. Bladder is unremarkable. Stomach/Bowel: Stomach is within normal limits. Appendix is surgically absent. No evidence of bowel wall thickening, distention, or inflammatory changes. Sigmoid diverticula. Vascular/Lymphatic: Aortic atherosclerosis. No enlarged abdominal or pelvic lymph nodes. Reproductive: Prostatomegaly. Other: No  abdominal wall hernia or abnormality. No abdominopelvic ascites. Musculoskeletal: No acute osseous findings. Ankylosis of the included thoracic and lumbar spine as well as the bilateral sacroiliac joint. Status post left hip total arthroplasty. IMPRESSION: 1. No acute CT findings of the abdomen or pelvis. 2. Sigmoid diverticulosis without evidence of acute diverticulitis. 3. Prostatomegaly. 4. Ankylosis of the included lower thoracic and lumbar spine as well as the bilateral sacroiliac joints, suggestive of ankylosing spondylitis. Aortic Atherosclerosis (ICD10-I70.0). Electronically Signed   By: Eddie Candle M.D.   On: 10/21/2020 11:24    All questions were answered. The patient knows to call the clinic with any problems, questions or concerns. I spent 45 minutes in the care of this patient including H and P, review of records, counseling and coordination of care.     Roy [name]  Breaunna Gottlieb, MD 11/11/2020 10:06 AM

## 2020-11-12 LAB — IGG, IGA, IGM
IgA: 432 mg/dL (ref 61–437)
IgG (Immunoglobin G), Serum: 1089 mg/dL (ref 603–1613)
IgM (Immunoglobulin M), Srm: 84 mg/dL (ref 20–172)

## 2020-11-14 ENCOUNTER — Other Ambulatory Visit: Payer: Self-pay

## 2020-11-14 ENCOUNTER — Inpatient Hospital Stay (HOSPITAL_COMMUNITY): Payer: Medicare Other

## 2020-11-14 DIAGNOSIS — D591 Autoimmune hemolytic anemia, unspecified: Secondary | ICD-10-CM | POA: Diagnosis not present

## 2020-11-14 LAB — PATHOLOGIST SMEAR REVIEW

## 2020-11-15 LAB — PROTEIN ELECTROPHORESIS, SERUM
A/G Ratio: 1.2 (ref 0.7–1.7)
Albumin ELP: 3.6 g/dL (ref 2.9–4.4)
Alpha-1-Globulin: 0.2 g/dL (ref 0.0–0.4)
Alpha-2-Globulin: 0.6 g/dL (ref 0.4–1.0)
Beta Globulin: 1.2 g/dL (ref 0.7–1.3)
Gamma Globulin: 1 g/dL (ref 0.4–1.8)
Globulin, Total: 3 g/dL (ref 2.2–3.9)
Total Protein ELP: 6.6 g/dL (ref 6.0–8.5)

## 2020-11-15 LAB — CRYOGLOBULIN

## 2020-11-18 LAB — PNH PROFILE (-HIGH SENSITIVITY)

## 2020-11-21 ENCOUNTER — Telehealth: Payer: Self-pay | Admitting: Nurse Practitioner

## 2020-11-21 NOTE — Telephone Encounter (Signed)
Tillie Rung is calling from [facility] in regards to needing a authorization for uptake and scan that pt is scheduled for this Wednesday. She is requesting a call back as soon as you can at [phone] ext [phone]

## 2020-11-22 LAB — ANTINUCLEAR ANTIBODIES, IFA: ANA Ab, IFA: NEGATIVE

## 2020-11-22 NOTE — Telephone Encounter (Signed)
Left a message requesting a return call to the office. 

## 2020-11-23 ENCOUNTER — Encounter (HOSPITAL_COMMUNITY): Payer: Medicare Other

## 2020-11-23 ENCOUNTER — Other Ambulatory Visit (HOSPITAL_COMMUNITY): Payer: Medicare Other

## 2020-11-24 ENCOUNTER — Other Ambulatory Visit (HOSPITAL_COMMUNITY): Payer: Medicare Other

## 2020-11-28 ENCOUNTER — Encounter: Payer: Self-pay | Admitting: Internal Medicine

## 2020-11-28 ENCOUNTER — Inpatient Hospital Stay (HOSPITAL_COMMUNITY): Payer: Medicare Other | Admitting: Physician Assistant

## 2020-11-28 ENCOUNTER — Other Ambulatory Visit: Payer: Self-pay

## 2020-11-28 VITALS — BP 121/68 | HR 78 | Temp 96.6°F | Resp 19 | Wt 263.4 lb

## 2020-11-28 DIAGNOSIS — Z862 Personal history of diseases of the blood and blood-forming organs and certain disorders involving the immune mechanism: Secondary | ICD-10-CM | POA: Diagnosis not present

## 2020-11-28 DIAGNOSIS — D596 Hemoglobinuria due to hemolysis from other external causes: Secondary | ICD-10-CM | POA: Diagnosis not present

## 2020-11-28 DIAGNOSIS — D591 Autoimmune hemolytic anemia, unspecified: Secondary | ICD-10-CM | POA: Diagnosis not present

## 2020-11-28 NOTE — Progress Notes (Signed)
[facility] Progress Note  Patient Care Team: Bridget Hartshorn, NP as PCP - General (Adult Health Nurse Practitioner)  CHIEF COMPLAINTS:  Concern for hemolysis.  HISTORY OF PRESENTING ILLNESS:   Roy [name] 70 y.o. male returns for a follow up for hemolysis without anemia. Patient is unaccompanied for this visit. He reports persistent fatigue but is able to complete his ADLs on his own. He attributes his fatigue to abnormal thyroid levels. He uses a cane to ambulate. Patient has a good appetite without any weight changes. He denies any nausea, vomiting or abdominal pain. He notes having constipation with a bowel movement every 1-2 days. He takes OTC laxatives and ducolax that improves symptoms. He denies easy bruising or signs of bleeding. Patient denies any fevers, chills, night sweats, shortness of breath, chest pain or cough. He has no other complaints. Rest of the pertinent 10 point ROS reviewed and negative.  REVIEW OF SYSTEMS:   Constitutional: Denies fevers, chills or abnormal night sweats Eyes: Denies blurriness of vision, double vision or watery eyes Ears, nose, mouth, throat, and face: Denies mucositis or sore throat Respiratory: Denies cough, dyspnea or wheezes Cardiovascular: Denies palpitation, chest discomfort or lower extremity swelling Gastrointestinal:  Denies nausea, heartburn or change in bowel habits Skin: Denies abnormal skin rashes Lymphatics: Denies new lymphadenopathy or easy bruising Neurological:Denies numbness, tingling or new weaknesses Behavioral/Psych: Mood is stable, no new changes  All other systems were reviewed with the patient and are negative.  MEDICAL HISTORY:  Past Medical History:  Diagnosis Date   Atrial fibrillation (HCC)    GERD (gastroesophageal reflux disease)    High cholesterol    Hypertension    Obesity     SURGICAL HISTORY: Past Surgical History:  Procedure Laterality Date    APPENDECTOMY     CATARACT EXTRACTION Bilateral 2022   CHOLECYSTECTOMY     ESOPHAGEAL DILATION     multiple times   JOINT REPLACEMENT Left    hip    SOCIAL HISTORY: Social History   Socioeconomic History   Marital status: Widowed    Spouse name: Not on file   Number of children: 3   Years of education: Not on file   Highest education level: Not on file  Occupational History   Occupation: Retired  Tobacco Use   Smoking status: Never   Smokeless tobacco: Never  Vaping Use   Vaping Use: Never used  Substance and Sexual Activity   Alcohol use: No   Drug use: No   Sexual activity: Not Currently  Other Topics Concern   Not on file  Social History Narrative   Not on file   Social Determinants of Health   Financial Resource Strain: Low Risk    Difficulty of Paying Living Expenses: Not hard at all  Food Insecurity: No Food Insecurity   Worried About Charity fundraiser in the Last Year: Never true   [place] in the Last Year: Never true  Transportation Needs: No Transportation Needs   Lack of Transportation (Medical): No   Lack of Transportation (Non-Medical): No  Physical Activity: Sufficiently Active   Days of Exercise per Week: 5 days   Minutes of Exercise per Session: 30 min  Stress: No Stress Concern Present   Feeling of Stress : Not at all  Social Connections: Moderately Isolated   Frequency of Communication with Friends and Family: More than three times a week   Frequency of Social Gatherings with Friends  and Family: Three times a week   Attends Religious Services: 1 to 4 times per year   Active Member of Clubs or Organizations: No   Attends Archivist Meetings: Never   Marital Status: Widowed  Human resources officer Violence: Not At Risk   Fear of Current or Ex-Partner: No   Emotionally Abused: No   Physically Abused: No   Sexually Abused: No    FAMILY HISTORY: Family History  Problem Relation Age of Onset   Heart failure Father    Heart  attack Father        Deceased    Thyroid disease Sister     ALLERGIES:  is allergic to adhesive [tape] and sulfa antibiotics.  MEDICATIONS:  Current Outpatient Medications  Medication Sig Dispense Refill   omeprazole (PRILOSEC) 10 MG capsule Take 1 capsule by mouth 2 (two) times daily.     amLODipine (NORVASC) 2.5 MG tablet Take 1 tablet (2.5 mg total) by mouth daily. 30 tablet 1   Calcium Carbonate-Vitamin D (CALCIUM-VITAMIN D) 600-125 MG-UNIT TABS Take by mouth.     Cholecalciferol (VITAMIN D3) 25 MCG (1000 UT) CAPS Take by mouth daily.     magnesium oxide (MAG-OX) 400 (240 Mg) MG tablet Take 1 tablet (400 mg total) by mouth 2 (two) times daily. 60 tablet 1   potassium chloride (KLOR-CON) 10 MEQ tablet Take 10 mEq by mouth 2 (two) times daily.     rosuvastatin (CRESTOR) 5 MG tablet Take 1 tablet by mouth at bedtime.     warfarin (COUMADIN) 5 MG tablet Take 1 tablet (5 mg total) by mouth daily at 4 PM.     No current facility-administered medications for this visit.     PHYSICAL EXAMINATION:  ECOG PERFORMANCE STATUS: 0 - Asymptomatic  Vitals:   11/28/20 0849  BP: 121/68  Pulse: 78  Resp: 19  Temp: (!) 96.6 F (35.9 C)  SpO2: 96%   Filed Weights   11/28/20 0849  Weight: 263 lb 6.4 oz (119.5 kg)    GENERAL:alert, no distress and comfortable SKIN: skin color, texture, turgor are normal, no rashes or significant lesions EYES: normal, conjunctiva are pink and non-injected, sclera clear NECK: supple, thyroid normal size, non-tender, without nodularity LUNGS: clear to auscultation and percussion with normal breathing effort HEART: regular rate & rhythm and no murmurs and no lower extremity edema ABDOMEN:abdomen soft, non-tender and normal bowel sounds Musculoskeletal:no cyanosis of digits and no clubbing  PSYCH: alert & oriented x 3 with fluent speech NEURO: no focal motor/sensory deficits  LABORATORY DATA:  I have reviewed the data as listed Lab Results  Component  Value Date   WBC 4.1 11/11/2020   HGB 17.3 (H) 11/11/2020   HCT 50.2 11/11/2020   MCV 90.8 11/11/2020   PLT 234 11/11/2020     Chemistry      Component Value Date/Time   NA 135 11/11/2020 1046   K 4.1 11/11/2020 1046   CL 101 11/11/2020 1046   CO2 23 11/11/2020 1046   BUN 12 11/11/2020 1046   CREATININE 0.82 11/11/2020 1046      Component Value Date/Time   CALCIUM 9.5 11/11/2020 1046   ALKPHOS 58 11/11/2020 1046   AST 74 (H) 11/11/2020 1046   ALT 21 11/11/2020 1046   BILITOT 2.3 (H) 11/11/2020 1046       RADIOGRAPHIC STUDIES: I have personally reviewed the radiological images as listed and agreed with the findings in the report.  CT A/P with contrast on 10/21/2020:  IMPRESSION: 1. No acute CT findings of the abdomen or pelvis. 2. Sigmoid diverticulosis without evidence of acute diverticulitis. 3. Prostatomegaly. 4. Ankylosis of the included lower thoracic and lumbar spine as well as the bilateral sacroiliac joints, suggestive of ankylosing spondylitis.   ASSESSMENT & PLAN: Roy [name] is a 70 y.o. 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   #Nonimmune hemolysis without anemia: --No evidence of inheritable/genetic causes --No concern for active infection --No evidence of medications/supplements  --Paroxysmal Noctural hemolobinuria profile was negative --Will refer patient to gastroenterology to rule out liver disease in the setting of elevated AST, hyperbilirubinemia and history of diffuse fatty infiltration seen on prior imaging (US abdomen 2006) --No indication for transfusion or treatment due to mild hemolysis without anemia. --RTC in 3 months with repeat labs.   All questions were answered. The patient knows to call the clinic with any problems, questions or concerns.  I have spent a total of 25 minutes minutes of face-to-face and non-face-to-face time, preparing to see the patient, obtaining and/or reviewing  separately obtained history, performing a medically appropriate examination, counseling and educating the patient, referring with other health care professionals, documenting clinical information in the electronic health record, and care coordination.   Lincoln Brigham, PA-C Hematology and [facility]

## 2020-11-29 ENCOUNTER — Other Ambulatory Visit (HOSPITAL_COMMUNITY): Payer: Self-pay

## 2020-11-29 DIAGNOSIS — D591 Autoimmune hemolytic anemia, unspecified: Secondary | ICD-10-CM

## 2020-11-29 DIAGNOSIS — D596 Hemoglobinuria due to hemolysis from other external causes: Secondary | ICD-10-CM

## 2020-11-30 ENCOUNTER — Ambulatory Visit: Payer: Medicare Other | Admitting: Nurse Practitioner

## 2020-12-05 ENCOUNTER — Other Ambulatory Visit: Payer: Self-pay

## 2020-12-05 ENCOUNTER — Encounter (HOSPITAL_COMMUNITY)
Admission: RE | Admit: 2020-12-05 | Discharge: 2020-12-05 | Disposition: A | Discharge status: Home / Self Care | Payer: Medicare Other | Source: Ambulatory Visit | Attending: Nurse Practitioner | Admitting: Nurse Practitioner

## 2020-12-05 DIAGNOSIS — E0590 Thyrotoxicosis, unspecified without thyrotoxic crisis or storm: Secondary | ICD-10-CM | POA: Insufficient documentation

## 2020-12-05 DIAGNOSIS — R7989 Other specified abnormal findings of blood chemistry: Secondary | ICD-10-CM | POA: Insufficient documentation

## 2020-12-05 MED ORDER — SODIUM IODIDE I-123 7.4 MBQ CAPS
452.0000 | ORAL_CAPSULE | Freq: Once | ORAL | Status: AC
  Administered 2020-12-05: 452 via ORAL

## 2020-12-06 ENCOUNTER — Encounter (HOSPITAL_COMMUNITY)
Admission: RE | Admit: 2020-12-06 | Discharge: 2020-12-06 | Disposition: A | Discharge status: Home / Self Care | Payer: Medicare Other | Source: Ambulatory Visit | Attending: Nurse Practitioner | Admitting: Nurse Practitioner

## 2020-12-09 ENCOUNTER — Ambulatory Visit: Payer: Medicare Other | Admitting: Nurse Practitioner

## 2020-12-12 ENCOUNTER — Encounter: Payer: Self-pay | Admitting: Nurse Practitioner

## 2020-12-12 ENCOUNTER — Other Ambulatory Visit: Payer: Self-pay

## 2020-12-12 ENCOUNTER — Ambulatory Visit: Payer: Medicare Other | Admitting: Nurse Practitioner

## 2020-12-12 VITALS — BP 127/79 | HR 64 | Ht 72.0 in | Wt 263.0 lb

## 2020-12-12 DIAGNOSIS — E0590 Thyrotoxicosis, unspecified without thyrotoxic crisis or storm: Secondary | ICD-10-CM

## 2020-12-12 NOTE — Progress Notes (Signed)
12/12/2020     Endocrinology Follow Up Note    Subjective:    Patient ID: Roy [name], male    DOB: 1950/06/13, PCP Lissa Hoard Karie Schwalbe, NP.   Past Medical History:  Diagnosis Date   Atrial fibrillation (HCC)    GERD (gastroesophageal reflux disease)    High cholesterol    Hypertension    Obesity     Past Surgical History:  Procedure Laterality Date   APPENDECTOMY     CATARACT EXTRACTION Bilateral 2022   CHOLECYSTECTOMY     ESOPHAGEAL DILATION     multiple times   JOINT REPLACEMENT Left    hip    Social History   Socioeconomic History   Marital status: Widowed    Spouse name: Not on file   Number of children: 3   Years of education: Not on file   Highest education level: Not on file  Occupational History   Occupation: Retired  Tobacco Use   Smoking status: Never   Smokeless tobacco: Never  Vaping Use   Vaping Use: Never used  Substance and Sexual Activity   Alcohol use: No   Drug use: No   Sexual activity: Not Currently  Other Topics Concern   Not on file  Social History Narrative   Not on file   Social Determinants of Health   Financial Resource Strain: Low Risk    Difficulty of Paying Living Expenses: Not hard at all  Food Insecurity: No Food Insecurity   Worried About Charity fundraiser in the Last Year: Never true   [place] in the Last Year: Never true  Transportation Needs: No Transportation Needs   Lack of Transportation (Medical): No   Lack of Transportation (Non-Medical): No  Physical Activity: Sufficiently Active   Days of Exercise per Week: 5 days   Minutes of Exercise per Session: 30 min  Stress: No Stress Concern Present   Feeling of Stress : Not at all  Social Connections: Moderately Isolated   Frequency of Communication with Friends and Family: More than three times a week   Frequency of Social Gatherings with Friends and Family: Three times a week   Attends Religious Services: 1 to 4 times per year    Active Member of Clubs or Organizations: No   Attends Archivist Meetings: Never   Marital Status: Widowed    Family History  Problem Relation Age of Onset   Heart failure Father    Heart attack Father        Deceased    Thyroid disease Sister     Outpatient Encounter Medications as of 12/12/2020  Medication Sig   amLODipine (NORVASC) 2.5 MG tablet Take 1 tablet (2.5 mg total) by mouth daily.   Calcium Carbonate-Vitamin D (CALCIUM-VITAMIN D) 600-125 MG-UNIT TABS Take by mouth.   Cholecalciferol (VITAMIN D3) 25 MCG (1000 UT) CAPS Take by mouth daily.   magnesium oxide (MAG-OX) 400 (240 Mg) MG tablet Take 1 tablet (400 mg total) by mouth 2 (two) times daily.   omeprazole (PRILOSEC) 10 MG capsule Take 1 capsule by mouth 2 (two) times daily.   potassium chloride (KLOR-CON) 10 MEQ tablet Take 10 mEq by mouth 2 (two) times daily.   rosuvastatin (CRESTOR) 5 MG tablet Take 1 tablet by mouth at bedtime.   warfarin (COUMADIN) 5 MG tablet Take 1 tablet (5 mg total) by mouth daily at 4 PM.   No facility-administered encounter medications on file as of 12/12/2020.  ALLERGIES: Allergies  Allergen Reactions   Adhesive [Tape]    Sulfa Antibiotics     UNKNOWN Childhood reaction    VACCINATION STATUS: Immunization History  Administered Date(s) Administered   Influenza,inj,Quad PF,6+ Mos 04/28/2015   Moderna Sars-Covid-2 Vaccination 07/03/2019, 07/31/2019   PFIZER(Purple Top)SARS-COV-2 Vaccination 03/25/2020   Pneumococcal Polysaccharide-23 04/28/2015     HPI  CLERANCE UZZLE is 70 y.o. male who presents today with a medical history as above. he is being seen in follow up after being seen in consultation for hyperthyroidism requested by Bridget Hartshorn, NP.  he has been dealing with symptoms of palpitations (has afib), anxiety, jitteriness, heat intolerance, and unexplained weight loss for about 1 month. These symptoms are mostly improving with exception of fatigue.  his most  recent thyroid labs revealed suppressed improved yet still suppressed TSH of 0.067 and normal FT4 of 1.12 and normal FT3 of 3.7.  They did perform antibody testing for autoimmune thyroid dysfunction which was negative.  he denies dysphagia, choking, shortness of breath, no recent voice change.    he denies family history of thyroid dysfunction and denies family hx of thyroid cancer. he denies personal history of goiter. he is not on any anti-thyroid medications nor on any thyroid hormone supplements. Denies use of Biotin containing supplements. He has never had any imaging of his thyroid done in the past.   Review of systems  Constitutional: + stable body weight,  current Body mass index is 35.67 kg/m., + fatigue, no subjective hyperthermia, no subjective hypothermia Eyes: no blurry vision, no xerophthalmia ENT: no sore throat, no nodules palpated in throat, no dysphagia/odynophagia, no hoarseness Cardiovascular: no chest pain, no shortness of breath, + palpitations (has hx of afib), no leg swelling Respiratory: no cough, no shortness of breath Gastrointestinal: no nausea/vomiting/diarrhea Musculoskeletal: no muscle/joint aches Skin: no rashes, no hyperemia Neurological: no tremors, no numbness, no tingling, no dizziness Psychiatric: no depression, + anxiety   Objective:    BP 127/79   Pulse 64   Ht 6' (1.829 m)   Wt 263 lb (119.3 kg)   BMI 35.67 kg/m   Wt Readings from Last 3 Encounters:  12/12/20 263 lb (119.3 kg)  11/28/20 263 lb 6.4 oz (119.5 kg)  11/11/20 264 lb 1.6 oz (119.8 kg)     BP Readings from Last 3 Encounters:  12/12/20 127/79  11/28/20 121/68  11/11/20 119/63                         Physical Exam- Limited  Constitutional:  Body mass index is 35.67 kg/m. , not in acute distress, normal state of mind Eyes:  EOMI, no exophthalmos Neck: Supple Cardiovascular: Irregular rhythm- hx afib, no murmurs, rubs, or gallops, no edema Respiratory: Adequate breathing  efforts, no crackles, rales, rhonchi, or wheezing Musculoskeletal: no gross deformities, strength intact in all four extremities, no gross restriction of joint movements Skin:  no rashes, no hyperemia Neurological: no tremor with outstretched hands, DTR normal in BLE   CMP     Component Value Date/Time   NA 135 11/11/2020 1046   K 4.1 11/11/2020 1046   CL 101 11/11/2020 1046   CO2 23 11/11/2020 1046   GLUCOSE 105 (H) 11/11/2020 1046   BUN 12 11/11/2020 1046   CREATININE 0.82 11/11/2020 1046   CALCIUM 9.5 11/11/2020 1046   PROT 7.1 11/11/2020 1046   ALBUMIN 3.9 11/11/2020 1046   AST 74 (H) 11/11/2020 1046   ALT 21  11/11/2020 1046   ALKPHOS 58 11/11/2020 1046   BILITOT 2.3 (H) 11/11/2020 1046   GFRNONAA >60 11/11/2020 1046   GFRAA >60 04/29/2019 1033     CBC    Component Value Date/Time   WBC 4.1 11/11/2020 1046   RBC 5.59 11/11/2020 1046   RBC 5.53 11/11/2020 1046   HGB 17.3 (H) 11/11/2020 1046   HCT 50.2 11/11/2020 1046   PLT 234 11/11/2020 1046   MCV 90.8 11/11/2020 1046   MCH 31.3 11/11/2020 1046   MCHC 34.5 11/11/2020 1046   RDW 13.5 11/11/2020 1046   LYMPHSABS 1.8 11/11/2020 1046   MONOABS 0.3 11/11/2020 1046   EOSABS 0.1 11/11/2020 1046   BASOSABS 0.1 11/11/2020 1046     Diabetic Labs (most recent): No results found for: HGBA1C  Lipid Panel     Component Value Date/Time   CHOL 147 04/29/2015 0426   TRIG 153 (H) 04/29/2015 0426   HDL 19 (L) 04/29/2015 0426   CHOLHDL 7.7 04/29/2015 0426   VLDL 31 04/29/2015 0426   LDLCALC 97 04/29/2015 0426     Lab Results  Component Value Date   TSH 0.067 (L) 11/09/2020   TSH 0.030 (L) 10/21/2020   TSH 3.836 03/17/2018   TSH 6.505 (H) 04/27/2015   TSH 10.503 (H) 04/27/2015   FREET4 1.12 11/09/2020   FREET4 1.75 (H) 10/21/2020   FREET4 0.98 03/17/2018   FREET4 0.93 04/28/2015     Results for DEONDRAE, DEMPEWOLF (MRN [id number]) as of 11/09/2020 11:28  Ref. Range 04/27/2015 16:39 04/28/2015 17:22 03/17/2018  09:04 10/21/2020 12:26 10/22/2020 15:38  TSH Latest Ref Range: 0.350 - 4.500 uIU/mL 6.505 (H)  3.836 0.030 (L)   Triiodothyronine (T3) Latest Ref Range: 71 - 180 ng/dL  149     T4,Free(Direct) Latest Ref Range: 0.61 - 1.12 ng/dL  0.93 0.98 1.75 (H)   Thyroperoxidase Ab SerPl-aCnc Latest Ref Range: 0 - 34 IU/mL     <8  Thyroglobulin Antibody Latest Ref Range: 0.0 - 0.9 IU/mL     <1.0  THYROID STIMULATING IMMUNOGLOBULIN Unknown     Rpt     Assessment & Plan:   1. Subclinical hyperthyroidism  he is being seen at a kind request of Hemberg, Karie Schwalbe, NP.  His repeat thyroid function tests show improvement without any intervention.  His uptake and scan was low, indicating the likelihood of acute inflammation in which his labs also point towards.  Will recheck thyroid labs in 4 months.  I expect continued improvement.    -Patient is advised to maintain close follow up with Hemberg, Karie Schwalbe, NP for primary care needs.     I spent 20 minutes in the care of the patient today including review of labs from Thyroid Function, CMP, and other relevant labs ; imaging/biopsy records (current and previous including abstractions from other facilities); face-to-face time discussing  his lab results and symptoms, medications doses, his options of short and long term treatment based on the latest standards of care / guidelines;   and documenting the encounter.  Roy [name]  participated in the discussions, expressed understanding, and voiced agreement with the above plans.  All questions were answered to his satisfaction. he is encouraged to contact clinic should he have any questions or concerns prior to his return visit.    Follow up plan: Return in about 4 months (around 04/13/2021) for Thyroid follow up, Previsit labs.   Thank you for involving me in the care of this pleasant patient, and  I will continue to update you with his progress.    Rayetta Pigg, [facility]  [facility] [address] [place], [place] [address] Phone: [phone] Fax: [phone]  12/12/2020, 9:36 AM

## 2020-12-20 DIAGNOSIS — I350 Nonrheumatic aortic (valve) stenosis: Secondary | ICD-10-CM | POA: Insufficient documentation

## 2021-01-02 ENCOUNTER — Emergency Department (HOSPITAL_COMMUNITY)
Admission: EM | Admit: 2021-01-02 | Discharge: 2021-01-02 | Disposition: A | Discharge status: Home / Self Care | Payer: Medicare Other | Attending: Emergency Medicine | Admitting: Emergency Medicine

## 2021-01-02 ENCOUNTER — Other Ambulatory Visit: Payer: Self-pay

## 2021-01-02 ENCOUNTER — Encounter (HOSPITAL_COMMUNITY): Payer: Self-pay | Admitting: Emergency Medicine

## 2021-01-02 DIAGNOSIS — Z96642 Presence of left artificial hip joint: Secondary | ICD-10-CM | POA: Insufficient documentation

## 2021-01-02 DIAGNOSIS — Z79899 Other long term (current) drug therapy: Secondary | ICD-10-CM | POA: Insufficient documentation

## 2021-01-02 DIAGNOSIS — Z20822 Contact with and (suspected) exposure to covid-19: Secondary | ICD-10-CM | POA: Diagnosis not present

## 2021-01-02 DIAGNOSIS — Z7901 Long term (current) use of anticoagulants: Secondary | ICD-10-CM | POA: Diagnosis not present

## 2021-01-02 DIAGNOSIS — I10 Essential (primary) hypertension: Secondary | ICD-10-CM | POA: Insufficient documentation

## 2021-01-02 DIAGNOSIS — R42 Dizziness and giddiness: Secondary | ICD-10-CM

## 2021-01-02 LAB — CBC WITH DIFFERENTIAL/PLATELET
Basophils Absolute: 0 10*3/uL (ref 0.0–0.1)
Basophils Relative: 1 %
Eosinophils Absolute: 0.1 10*3/uL (ref 0.0–0.5)
Eosinophils Relative: 2 %
HCT: 46.2 % (ref 39.0–52.0)
Hemoglobin: 15.8 g/dL (ref 13.0–17.0)
Lymphocytes Relative: 49 %
Lymphs Abs: 1.9 10*3/uL (ref 0.7–4.0)
MCH: 31 pg (ref 26.0–34.0)
MCHC: 34.2 g/dL (ref 30.0–36.0)
MCV: 90.8 fL (ref 80.0–100.0)
Monocytes Absolute: 0.2 10*3/uL (ref 0.1–1.0)
Monocytes Relative: 5 %
Neutro Abs: 1.6 10*3/uL — ABNORMAL LOW (ref 1.7–7.7)
Neutrophils Relative %: 43 %
Platelets: 183 10*3/uL (ref 150–400)
RBC: 5.09 MIL/uL (ref 4.22–5.81)
RDW: 14.3 % (ref 11.5–15.5)
WBC: 3.8 10*3/uL — ABNORMAL LOW (ref 4.0–10.5)
nRBC: 0 % (ref 0.0–0.2)

## 2021-01-02 LAB — COMPREHENSIVE METABOLIC PANEL
ALT: 17 U/L (ref 0–44)
AST: 107 U/L — ABNORMAL HIGH (ref 15–41)
Albumin: 3.8 g/dL (ref 3.5–5.0)
Alkaline Phosphatase: 61 U/L (ref 38–126)
Anion gap: 10 (ref 5–15)
BUN: 13 mg/dL (ref 8–23)
CO2: 25 mmol/L (ref 22–32)
Calcium: 9.8 mg/dL (ref 8.9–10.3)
Chloride: 96 mmol/L — ABNORMAL LOW (ref 98–111)
Creatinine, Ser: 0.85 mg/dL (ref 0.61–1.24)
GFR, Estimated: >60 mL/min (ref >60)
Glucose, Bld: 96 mg/dL (ref 70–99)
Potassium: 4.2 mmol/L (ref 3.5–5.1)
Sodium: 131 mmol/L — ABNORMAL LOW (ref 135–145)
Total Bilirubin: 2.3 mg/dL — ABNORMAL HIGH (ref 0.3–1.2)
Total Protein: 6.6 g/dL (ref 6.5–8.1)

## 2021-01-02 LAB — PROTIME-INR
INR: 3.6 — ABNORMAL HIGH (ref 0.8–1.2)
Prothrombin Time: 35.6 seconds — ABNORMAL HIGH (ref 11.4–15.2)

## 2021-01-02 LAB — MAGNESIUM: Magnesium: 1.8 mg/dL (ref 1.7–2.4)

## 2021-01-02 LAB — RESP PANEL BY RT-PCR (FLU A&B, COVID) ARPGX2
Influenza A by PCR: NEGATIVE
Influenza B by PCR: NEGATIVE
SARS Coronavirus 2 by RT PCR: NEGATIVE

## 2021-01-02 LAB — CBG MONITORING, ED: Glucose-Capillary: 105 mg/dL — ABNORMAL HIGH (ref 70–99)

## 2021-01-02 MED ORDER — MECLIZINE HCL 12.5 MG PO TABS
25.0000 mg | ORAL_TABLET | Freq: Once | ORAL | Status: DC
  Filled 2021-01-02: qty 2

## 2021-01-02 MED ORDER — SODIUM CHLORIDE 0.9 % IV BOLUS
1000.0000 mL | Freq: Once | INTRAVENOUS | Status: AC
  Administered 2021-01-02: 1000 mL via INTRAVENOUS

## 2021-01-02 MED ORDER — MECLIZINE HCL 12.5 MG PO TABS: 12.5000 mg | ORAL_TABLET | Freq: Three times a day (TID) | ORAL | 0 refills | Status: DC | PRN

## 2021-01-02 NOTE — Discharge Instructions (Addendum)
Lab work was all reassuring.  Suspect you are dehydrated, please drink plenty of fluids, also given you meclizine which will help with dizziness please use as needed.  Please follow-up your PCP as needed.  Come back to the emergency department if you develop chest pain, shortness of breath, severe abdominal pain, uncontrolled nausea, vomiting, diarrhea.

## 2021-01-02 NOTE — ED Provider Notes (Signed)
[facility] EMERGENCY DEPARTMENT Provider Note   CSN: [id number] Arrival date & time: 01/02/21  0934     History Chief Complaint  Patient presents with   Dizziness   Leg Pain    Roy [name] is a 70 y.o. male.  HPI  Patient with significant medical history of A. fib currently on warfarin, hypertension, obesity presents with chief complaint of feeling dizzy.  He describes the dizziness as a feeling of being off balance, happens when he changes positions, states he feels off balance, and will have occasional shortness of breath, he denies chest pain, becoming diaphoretic, nausea, vomiting.  Patient denies recent head trauma he has no associated headaches, change in vision, paresthesia/ weakness in the upper or lower extremities, states that he never felt this in the past, patient endorses that his dizziness resolved when once he is stationary, states been going on for the last 3 weeks, he states he is here today because he cannot take it any longer.  Has no other complaints at this time.  Does not endorse orthopnea, worsening pedal edema, stomach pains, nausea vomiting diarrhea.  Past Medical History:  Diagnosis Date   Atrial fibrillation ([place])    GERD (gastroesophageal reflux disease)    High cholesterol    Hypertension    Obesity     Patient Active Problem List   Diagnosis Date Noted   Hyperthyroidism 10/22/2020   Coagulopathy ([place]) 10/21/2020   Cellulitis 03/16/2018   Obesity (BMI 30-39.9) 05/04/2015   Chronic anticoagulation 05/04/2015   Trifascicular block 04/29/2015   LAFB (left anterior fascicular block) 04/29/2015   Essential hypertension    Hypokalemia 04/28/2015   RBBB 04/28/2015   First degree AV block 04/28/2015   PAF (paroxysmal atrial fibrillation) ([place]) 04/27/2015   Dizziness 04/27/2015    Past Surgical History:  Procedure Laterality Date   APPENDECTOMY     CATARACT EXTRACTION Bilateral 2022   CHOLECYSTECTOMY     ESOPHAGEAL DILATION     multiple  times   JOINT REPLACEMENT Left    hip       Family History  Problem Relation Age of Onset   Heart failure Father    Heart attack Father        Deceased    Thyroid disease Sister     Social History   Tobacco Use   Smoking status: Never   Smokeless tobacco: Never  Vaping Use   Vaping Use: Never used  Substance Use Topics   Alcohol use: No   Drug use: No    Home Medications Prior to Admission medications   Medication Sig Start Date End Date Taking? Authorizing Provider  Calcium Carbonate-Vitamin D (CALCIUM-VITAMIN D) 600-125 MG-UNIT TABS Take by mouth.   Yes [provider]  Cholecalciferol (VITAMIN D3) 25 MCG (1000 UT) CAPS Take by mouth daily.   Yes [provider]  magnesium oxide (MAG-OX) 400 (240 Mg) MG tablet Take 1 tablet (400 mg total) by mouth 2 (two) times daily. 10/24/20  Yes Tat, Shanon Brow, MD  meclizine (ANTIVERT) 12.5 MG tablet Take 1 tablet (12.5 mg total) by mouth 3 (three) times daily as needed for dizziness. 01/02/21  Yes Marcello Fennel, PA-C  omeprazole (PRILOSEC) 10 MG capsule Take 1 capsule by mouth 2 (two) times daily. 11/15/20  Yes [provider]  potassium chloride (KLOR-CON) 10 MEQ tablet Take 10 mEq by mouth 2 (two) times daily. 11/04/20  Yes [provider]  rosuvastatin (CRESTOR) 5 MG tablet Take 1 tablet by mouth  at bedtime. 08/23/20  Yes [provider]  warfarin (COUMADIN) 5 MG tablet Take 1 tablet (5 mg total) by mouth daily at 4 PM. 10/24/20  Yes Tat, Shanon Brow, MD  amLODipine (NORVASC) 2.5 MG tablet Take 1 tablet (2.5 mg total) by mouth daily. Patient not taking: Reported on 01/02/2021 10/24/20   [name], MD  magnesium oxide (MAG-OX) 400 MG tablet Take by mouth. Patient not taking: Reported on 01/02/2021 12/19/20 01/18/21  [provider]  potassium chloride (KLOR-CON) 10 MEQ tablet Take by mouth. Patient not taking: Reported on 01/02/2021 12/13/20 01/12/21  [provider]    Allergies     Adhesive [tape] and Sulfa antibiotics  Review of Systems   Review of Systems  Constitutional:  Negative for chills and fever.  HENT:  Negative for congestion.   Respiratory:  Negative for shortness of breath.   Cardiovascular:  Negative for chest pain.  Gastrointestinal:  Negative for abdominal pain.  Genitourinary:  Negative for enuresis.  Musculoskeletal:  Negative for back pain.  Skin:  Negative for rash.  Neurological:  Positive for dizziness. Negative for weakness and headaches.  Hematological:  Does not bruise/bleed easily.   Physical Exam Updated Vital Signs BP 113/63   Pulse 67   Temp 97.8 F (36.6 C) (Oral)   Resp 18   SpO2 93%   Physical Exam Vitals and nursing note reviewed.  Constitutional:      General: He is not in acute distress.    Appearance: He is not ill-appearing.  HENT:     Head: Normocephalic and atraumatic.     Nose: No congestion.  Eyes:     Conjunctiva/sclera: Conjunctivae normal.  Cardiovascular:     Rate and Rhythm: Normal rate and regular rhythm.     Pulses: Normal pulses.     Heart sounds: No murmur heard.   No friction rub. No gallop.  Pulmonary:     Effort: No respiratory distress.     Breath sounds: No wheezing, rhonchi or rales.  Skin:    General: Skin is warm and dry.  Neurological:     Mental Status: He is alert.     GCS: GCS eye subscore is 4. GCS verbal subscore is 5. GCS motor subscore is 6.     Comments: Cranial nerves II through XII are grossly intact, patient have no difficulty word finding, no slurring of his words, able to follow two-step commands, no unilateral weakness present.   Psychiatric:        Mood and Affect: Mood normal.    ED Results / Procedures / Treatments   Labs (all labs ordered are listed, but only abnormal results are displayed) Labs Reviewed  COMPREHENSIVE METABOLIC PANEL - Abnormal; Notable for the following components:      Result Value   Sodium 131 (*)    Chloride 96 (*)    AST 107 (*)     Total Bilirubin 2.3 (*)    All other components within normal limits  CBC WITH DIFFERENTIAL/PLATELET - Abnormal; Notable for the following components:   WBC 3.8 (*)    Neutro Abs 1.6 (*)    All other components within normal limits  PROTIME-INR - Abnormal; Notable for the following components:   Prothrombin Time 35.6 (*)    INR 3.6 (*)    All other components within normal limits  CBG MONITORING, ED - Abnormal; Notable for the following components:   Glucose-Capillary 105 (*)    All other components within normal limits  RESP  PANEL BY RT-PCR (FLU A&B, COVID) ARPGX2  MAGNESIUM    EKG   Radiology No results found.  Procedures Procedures   Medications Ordered in ED Medications  meclizine (ANTIVERT) tablet 25 mg (has no administration in time range)  sodium chloride 0.9 % bolus 1,000 mL (1,000 mLs Intravenous New Bag/Given 01/02/21 1155)    ED Course  I have reviewed the triage vital signs and the nursing notes.  Pertinent labs & imaging results that were available during my care of the patient were reviewed by me and considered in my medical decision making (see chart for details).    MDM Rules/Calculators/A&P                          Initial impression-presents with dizziness.  He is alert, does not appear in acute stress, discharged home for hypotension systolic [id number].  Suspect possible dehydration, will obtain basic lab work-up, providing fluids, and reassess.  Work-up-CBC shows slight leukocytopenia and with a white count of 3.8, CMP shows slight hyponatremia of 131, T bili slightly elevated 2.3 appears to be as baseline, magnesium 1.8, prothrombin time 35, INR 3.6, respiratory panel unremarkable patient had a negative orthostatic vital signs.  Reassessment-patient was reassessed after fluids, states he is feeling much better, BP has stabilized, no longer feels dizzy upon changing position or or ambulation.  Patient is agreeable for discharge at this time.  Rule out- I  have low suspicion for ACS as history is atypical, denies chest pain, shortness of breath, no pedal edema present my exam, EKG was sinus rhythm without signs of ischemia.  Low suspicion for PE as patient denies pleuritic chest pain, shortness of breath, patient denies leg pain, no pedal edema noted on exam, vital signs reassuring.  I have low suspicion for intracranial head bleed, mass, CVA as patient denies change in vision, paresthesias or weakness upper lower extremities, he has had no recent head trauma, no neurodeficits present on exam. symptoms also resolved after fluids suspect this was more dehydration. Low suspicion for AAA or aortic dissection as history is atypical, patient has low risk factors.  Low suspicion for systemic infection, meningitis as there is no meningeal sign present my exam, as patient is nontoxic-appearing, vital signs reassuring, no obvious source infection noted on exam.   Plan-  Dizziness since resolved-likely this is vasovagal versus dehydration, will provide him with short course of meclizine, recommend that he stays hydrated, follow-up with his PCP for further evaluation if needed.  Vital signs have remained stable, no indication for hospital admission.  Patient discussed with attending and they agreed with assessment and plan.  Patient given at home care as well strict return precautions.  Patient verbalized that they understood agreed to said plan.  Final Clinical Impression(s) / ED Diagnoses Final diagnoses:  Dizziness    Rx / DC Orders ED Discharge Orders          Ordered    meclizine (ANTIVERT) 12.5 MG tablet  3 times daily PRN        01/02/21 1325             Aron Baba 01/02/21 1327    Noemi Chapel, MD 01/03/21 [phone]

## 2021-01-02 NOTE — ED Triage Notes (Signed)
Pt c/o of dizziness x 1 month and left leg pain.  Saw PCP and they told him to stop his BP meds. BP 86/51

## 2021-01-08 ENCOUNTER — Encounter (HOSPITAL_COMMUNITY): Payer: Self-pay

## 2021-01-08 ENCOUNTER — Emergency Department (HOSPITAL_COMMUNITY): Payer: Medicare Other

## 2021-01-08 ENCOUNTER — Inpatient Hospital Stay (HOSPITAL_COMMUNITY)
Admission: EM | Admit: 2021-01-08 | Discharge: 2021-01-13 | DRG: 948 | Disposition: A | Discharge status: Home / Self Care | Payer: Medicare Other | Attending: Family Medicine | Admitting: Family Medicine

## 2021-01-08 ENCOUNTER — Other Ambulatory Visit: Payer: Self-pay

## 2021-01-08 DIAGNOSIS — I10 Essential (primary) hypertension: Secondary | ICD-10-CM | POA: Diagnosis not present

## 2021-01-08 DIAGNOSIS — M542 Cervicalgia: Secondary | ICD-10-CM

## 2021-01-08 DIAGNOSIS — R0600 Dyspnea, unspecified: Secondary | ICD-10-CM

## 2021-01-08 DIAGNOSIS — E871 Hypo-osmolality and hyponatremia: Secondary | ICD-10-CM | POA: Diagnosis present

## 2021-01-08 DIAGNOSIS — Z79899 Other long term (current) drug therapy: Secondary | ICD-10-CM

## 2021-01-08 DIAGNOSIS — E7800 Pure hypercholesterolemia, unspecified: Secondary | ICD-10-CM | POA: Diagnosis present

## 2021-01-08 DIAGNOSIS — Z96642 Presence of left artificial hip joint: Secondary | ICD-10-CM | POA: Diagnosis present

## 2021-01-08 DIAGNOSIS — E669 Obesity, unspecified: Secondary | ICD-10-CM | POA: Diagnosis present

## 2021-01-08 DIAGNOSIS — E0590 Thyrotoxicosis, unspecified without thyrotoxic crisis or storm: Secondary | ICD-10-CM | POA: Diagnosis present

## 2021-01-08 DIAGNOSIS — Z7901 Long term (current) use of anticoagulants: Secondary | ICD-10-CM

## 2021-01-08 DIAGNOSIS — Z6833 Body mass index (BMI) 33.0-33.9, adult: Secondary | ICD-10-CM

## 2021-01-08 DIAGNOSIS — I440 Atrioventricular block, first degree: Secondary | ICD-10-CM | POA: Diagnosis present

## 2021-01-08 DIAGNOSIS — R001 Bradycardia, unspecified: Secondary | ICD-10-CM | POA: Diagnosis present

## 2021-01-08 DIAGNOSIS — Z91048 Other nonmedicinal substance allergy status: Secondary | ICD-10-CM

## 2021-01-08 DIAGNOSIS — I453 Trifascicular block: Secondary | ICD-10-CM | POA: Diagnosis present

## 2021-01-08 DIAGNOSIS — R531 Weakness: Principal | ICD-10-CM

## 2021-01-08 DIAGNOSIS — E2740 Unspecified adrenocortical insufficiency: Secondary | ICD-10-CM | POA: Diagnosis present

## 2021-01-08 DIAGNOSIS — I480 Paroxysmal atrial fibrillation: Secondary | ICD-10-CM | POA: Diagnosis present

## 2021-01-08 DIAGNOSIS — I444 Left anterior fascicular block: Secondary | ICD-10-CM | POA: Diagnosis present

## 2021-01-08 DIAGNOSIS — I959 Hypotension, unspecified: Secondary | ICD-10-CM | POA: Diagnosis present

## 2021-01-08 DIAGNOSIS — Z20822 Contact with and (suspected) exposure to covid-19: Secondary | ICD-10-CM | POA: Diagnosis present

## 2021-01-08 DIAGNOSIS — K219 Gastro-esophageal reflux disease without esophagitis: Secondary | ICD-10-CM | POA: Diagnosis present

## 2021-01-08 DIAGNOSIS — I4510 Unspecified right bundle-branch block: Secondary | ICD-10-CM | POA: Diagnosis present

## 2021-01-08 DIAGNOSIS — Z8349 Family history of other endocrine, nutritional and metabolic diseases: Secondary | ICD-10-CM

## 2021-01-08 DIAGNOSIS — Z8249 Family history of ischemic heart disease and other diseases of the circulatory system: Secondary | ICD-10-CM

## 2021-01-08 DIAGNOSIS — Z882 Allergy status to sulfonamides status: Secondary | ICD-10-CM

## 2021-01-08 DIAGNOSIS — R0609 Other forms of dyspnea: Secondary | ICD-10-CM

## 2021-01-08 DIAGNOSIS — I4891 Unspecified atrial fibrillation: Secondary | ICD-10-CM | POA: Diagnosis present

## 2021-01-08 LAB — CBC
HCT: 44.1 % (ref 39.0–52.0)
Hemoglobin: 15.8 g/dL (ref 13.0–17.0)
MCH: 31.5 pg (ref 26.0–34.0)
MCHC: 35.8 g/dL (ref 30.0–36.0)
MCV: 87.8 fL (ref 80.0–100.0)
Platelets: 177 10*3/uL (ref 150–400)
RBC: 5.02 MIL/uL (ref 4.22–5.81)
RDW: 14 % (ref 11.5–15.5)
WBC: 3.6 10*3/uL — ABNORMAL LOW (ref 4.0–10.5)
nRBC: 0 % (ref 0.0–0.2)

## 2021-01-08 LAB — RESP PANEL BY RT-PCR (FLU A&B, COVID) ARPGX2
Influenza A by PCR: NEGATIVE
Influenza B by PCR: NEGATIVE
SARS Coronavirus 2 by RT PCR: NEGATIVE

## 2021-01-08 LAB — TROPONIN I (HIGH SENSITIVITY)
Troponin I (High Sensitivity): 7 ng/L (ref <18)
Troponin I (High Sensitivity): 7 ng/L (ref <18)

## 2021-01-08 LAB — COMPREHENSIVE METABOLIC PANEL
ALT: 15 U/L (ref 0–44)
AST: 119 U/L — ABNORMAL HIGH (ref 15–41)
Albumin: 3.8 g/dL (ref 3.5–5.0)
Alkaline Phosphatase: 65 U/L (ref 38–126)
Anion gap: 11 (ref 5–15)
BUN: 7 mg/dL — ABNORMAL LOW (ref 8–23)
CO2: 25 mmol/L (ref 22–32)
Calcium: 9.4 mg/dL (ref 8.9–10.3)
Chloride: 89 mmol/L — ABNORMAL LOW (ref 98–111)
Creatinine, Ser: 0.71 mg/dL (ref 0.61–1.24)
GFR, Estimated: >60 mL/min (ref >60)
Glucose, Bld: 86 mg/dL (ref 70–99)
Potassium: 3.8 mmol/L (ref 3.5–5.1)
Sodium: 125 mmol/L — ABNORMAL LOW (ref 135–145)
Total Bilirubin: 3 mg/dL — ABNORMAL HIGH (ref 0.3–1.2)
Total Protein: 6.6 g/dL (ref 6.5–8.1)

## 2021-01-08 LAB — URINALYSIS, ROUTINE W REFLEX MICROSCOPIC
Bilirubin Urine: NEGATIVE
Glucose, UA: NEGATIVE mg/dL
Hgb urine dipstick: NEGATIVE
Ketones, ur: NEGATIVE mg/dL
Leukocytes,Ua: NEGATIVE
Nitrite: NEGATIVE
Protein, ur: NEGATIVE mg/dL
Specific Gravity, Urine: 1.02 (ref 1.005–1.030)
pH: 5.5 (ref 5.0–8.0)

## 2021-01-08 LAB — BRAIN NATRIURETIC PEPTIDE: B Natriuretic Peptide: 177 pg/mL — ABNORMAL HIGH (ref 0.0–100.0)

## 2021-01-08 LAB — PROTIME-INR
INR: 3.6 — ABNORMAL HIGH (ref 0.8–1.2)
Prothrombin Time: 35.9 seconds — ABNORMAL HIGH (ref 11.4–15.2)

## 2021-01-08 LAB — CBG MONITORING, ED: Glucose-Capillary: 89 mg/dL (ref 70–99)

## 2021-01-08 LAB — TSH: TSH: 0.036 u[IU]/mL — ABNORMAL LOW (ref 0.350–4.500)

## 2021-01-08 MED ORDER — ATROPINE SULFATE 0.4 MG/ML IJ SOLN
0.4000 mg | INTRAMUSCULAR | Status: DC | PRN
  Filled 2021-01-08: qty 1

## 2021-01-08 MED ORDER — ROSUVASTATIN CALCIUM 10 MG PO TABS
5.0000 mg | ORAL_TABLET | Freq: Every day | ORAL | Status: DC
  Administered 2021-01-08 – 2021-01-12 (×5): 5 mg via ORAL
  Filled 2021-01-08 (×6): qty 1

## 2021-01-08 MED ORDER — ACETAMINOPHEN 325 MG PO TABS
650.0000 mg | ORAL_TABLET | Freq: Four times a day (QID) | ORAL | Status: DC | PRN
  Administered 2021-01-08: 650 mg via ORAL
  Filled 2021-01-08: qty 2

## 2021-01-08 MED ORDER — WARFARIN - PHARMACIST DOSING INPATIENT: Freq: Every day | Status: DC

## 2021-01-08 MED ORDER — ATROPINE SULFATE 0.4 MG/ML IJ SOLN
0.4000 mg | Freq: Once | INTRAMUSCULAR | Status: DC
  Filled 2021-01-08: qty 1

## 2021-01-08 MED ORDER — ENSURE ENLIVE PO LIQD
237.0000 mL | Freq: Two times a day (BID) | ORAL | Status: DC
  Administered 2021-01-09 – 2021-01-13 (×9): 237 mL via ORAL

## 2021-01-08 MED ORDER — POTASSIUM CHLORIDE CRYS ER 10 MEQ PO TBCR
10.0000 meq | EXTENDED_RELEASE_TABLET | Freq: Two times a day (BID) | ORAL | Status: DC
  Administered 2021-01-08 – 2021-01-13 (×11): 10 meq via ORAL
  Filled 2021-01-08 (×11): qty 1

## 2021-01-08 NOTE — Progress Notes (Signed)
[facility] night shift telemetry coverage note.  The nursing staff reported that the patient's heart rate was in the mid 50s.  He was also able to go to the bathroom with no symptoms.  I have changed the atropine 0.4 mg IVP x1 order as needed.  He also had neck pain and acetaminophen 650 mg every 6 hours was ordered.  Tennis Must, MD.

## 2021-01-08 NOTE — ED Provider Notes (Signed)
[facility] EMERGENCY DEPARTMENT Provider Note   CSN: [id number] Arrival date & time: 01/08/21  0925     History Chief Complaint  Patient presents with   Weakness    KENNARD JOHANNSEN is a 70 y.o. male.  HPI   Pt is a 70 y/o male with a h/o afib, gerd, hld, htn, obesity, who presents to the ED today for eval of generalized weakness for the last month. States he has felt sob for the last few weeks as well. Report diarrhea x2 days. Denies ble swelling, cough, chest pain, vomiting, abd pain, bloody stools, fevers. Sxs constant and have worsened since onset. Denies exacerbating or alleviating factors  Past Medical History:  Diagnosis Date   Atrial fibrillation ([place])    GERD (gastroesophageal reflux disease)    High cholesterol    Hypertension    Obesity     Patient Active Problem List   Diagnosis Date Noted   Hyponatremia 01/08/2021   Hyperthyroidism 10/22/2020   Coagulopathy ([place]) 10/21/2020   Cellulitis 03/16/2018   Obesity (BMI 30-39.9) 05/04/2015   Chronic anticoagulation 05/04/2015   Trifascicular block 04/29/2015   LAFB (left anterior fascicular block) 04/29/2015   Essential hypertension    Hypokalemia 04/28/2015   RBBB 04/28/2015   First degree AV block 04/28/2015   PAF (paroxysmal atrial fibrillation) ([place]) 04/27/2015   Dizziness 04/27/2015    Past Surgical History:  Procedure Laterality Date   APPENDECTOMY     CATARACT EXTRACTION Bilateral 2022   CHOLECYSTECTOMY     ESOPHAGEAL DILATION     multiple times   JOINT REPLACEMENT Left    hip       Family History  Problem Relation Age of Onset   Heart failure Father    Heart attack Father        Deceased    Thyroid disease Sister     Social History   Tobacco Use   Smoking status: Never   Smokeless tobacco: Never  Vaping Use   Vaping Use: Never used  Substance Use Topics   Alcohol use: No   Drug use: No    Home Medications Prior to Admission medications   Medication Sig Start Date End Date  Taking? Authorizing Provider  amLODipine (NORVASC) 2.5 MG tablet Take 1 tablet (2.5 mg total) by mouth daily. Patient not taking: Reported on 01/02/2021 10/24/20   [name], MD  Calcium Carbonate-Vitamin D (CALCIUM-VITAMIN D) 600-125 MG-UNIT TABS Take by mouth.    [provider]  Cholecalciferol (VITAMIN D3) 25 MCG (1000 UT) CAPS Take by mouth daily.    [provider]  magnesium oxide (MAG-OX) 400 (240 Mg) MG tablet Take 1 tablet (400 mg total) by mouth 2 (two) times daily. 10/24/20   [name], MD  magnesium oxide (MAG-OX) 400 MG tablet Take by mouth. Patient not taking: Reported on 01/02/2021 12/19/20 01/18/21  [provider]  meclizine (ANTIVERT) 12.5 MG tablet Take 1 tablet (12.5 mg total) by mouth 3 (three) times daily as needed for dizziness. 01/02/21   Marcello Fennel, PA-C  omeprazole (PRILOSEC) 10 MG capsule Take 1 capsule by mouth 2 (two) times daily. 11/15/20   [provider]  potassium chloride (KLOR-CON) 10 MEQ tablet Take 10 mEq by mouth 2 (two) times daily. 11/04/20   [provider]  potassium chloride (KLOR-CON) 10 MEQ tablet Take by mouth. Patient not taking: Reported on 01/02/2021 12/13/20 01/12/21  [provider]  rosuvastatin (CRESTOR) 5 MG tablet Take 1 tablet by mouth at  bedtime. 08/23/20   [provider]  warfarin (COUMADIN) 5 MG tablet Take 1 tablet (5 mg total) by mouth daily at 4 PM. 10/24/20   Tat, Shanon Brow, MD    Allergies    Adhesive [tape] and Sulfa antibiotics  Review of Systems   Review of Systems  Constitutional:  Positive for fatigue. Negative for fever.  HENT:  Negative for ear pain and sore throat.   Eyes:  Negative for pain and visual disturbance.  Respiratory:  Positive for shortness of breath. Negative for cough.   Cardiovascular:  Negative for chest pain and leg swelling.  Gastrointestinal:  Positive for diarrhea. Negative for abdominal pain, constipation, nausea and vomiting.  Genitourinary:   Negative for dysuria, flank pain and hematuria.  Musculoskeletal:  Negative for back pain.  Skin:  Negative for rash.  Neurological:  Positive for weakness.  All other systems reviewed and are negative.  Physical Exam Updated Vital Signs BP (!) 91/58   Pulse (!) 57   Temp (!) 97 F (36.1 C) (Oral)   Resp 17   Ht 6' (1.829 m)   Wt 113.4 kg   SpO2 95%   BMI 33.91 kg/m   Physical Exam Vitals and nursing note reviewed.  Constitutional:      Appearance: He is well-developed.  HENT:     Head: Normocephalic and atraumatic.  Eyes:     Conjunctiva/sclera: Conjunctivae normal.  Cardiovascular:     Rate and Rhythm: Normal rate and regular rhythm.     Heart sounds: Normal heart sounds. No murmur heard. Pulmonary:     Effort: Pulmonary effort is normal. No respiratory distress.     Breath sounds: Normal breath sounds. No wheezing, rhonchi or rales.  Abdominal:     General: Bowel sounds are normal.     Palpations: Abdomen is soft.     Tenderness: There is no abdominal tenderness. There is no guarding or rebound.  Musculoskeletal:     Cervical back: Neck supple.     Comments: Trace ble edema, no calf ttp  Skin:    General: Skin is warm and dry.  Neurological:     Mental Status: He is alert.    ED Results / Procedures / Treatments   Labs (all labs ordered are listed, but only abnormal results are displayed) Labs Reviewed  CBC - Abnormal; Notable for the following components:      Result Value   WBC 3.6 (*)    All other components within normal limits  PROTIME-INR - Abnormal; Notable for the following components:   Prothrombin Time 35.9 (*)    INR 3.6 (*)    All other components within normal limits  COMPREHENSIVE METABOLIC PANEL - Abnormal; Notable for the following components:   Sodium 125 (*)    Chloride 89 (*)    BUN 7 (*)    AST 119 (*)    Total Bilirubin 3.0 (*)    All other components within normal limits  BRAIN NATRIURETIC PEPTIDE - Abnormal; Notable for the  following components:   B Natriuretic Peptide 177.0 (*)    All other components within normal limits  RESP PANEL BY RT-PCR (FLU A&B, COVID) ARPGX2  URINALYSIS, ROUTINE W REFLEX MICROSCOPIC  SODIUM, URINE, RANDOM  OSMOLALITY, URINE  CREATININE, URINE, RANDOM  CBG MONITORING, ED  TROPONIN I (HIGH SENSITIVITY)  TROPONIN I (HIGH SENSITIVITY)    EKG EKG Interpretation  Date/Time:  Sunday January 08 2021 09:46:57 EDT Ventricular Rate:  59 PR Interval:    QRS  Duration: 166 QT Interval:  534 QTC Calculation: 530 R Axis:   -89 Text Interpretation: Atrial flutter with predominant 4:1 AV block RBBB and LAFB Confirmed by Davonna Belling [phone]) on 01/08/2021 11:14:22 AM  Radiology DG Chest Portable 1 View  Result Date: 01/08/2021 CLINICAL DATA:  Dyspnea, weakness, diarrhea EXAM: PORTABLE CHEST 1 VIEW COMPARISON:  03/26/2018 chest radiograph. FINDINGS: Stable cardiomediastinal silhouette with top-normal heart size. No pneumothorax. No pleural effusion. Lungs appear clear, with no acute consolidative airspace disease and no pulmonary edema. IMPRESSION: No active disease. Electronically Signed   By: Ilona Sorrel M.D.   On: 01/08/2021 10:17    Procedures Procedures   11:30 AM ardiac monitoring reveals HR 68, aflutter (Rate & rhythm), as reviewed and interpreted by me. Cardiac monitoring was ordered due to arrhythmia, dyspnea on exertion, concern for acs, and to monitor patient for dysrhythmia.   Medications Ordered in ED Medications - No data to display  ED Course  I have reviewed the triage vital signs and the nursing notes.  Pertinent labs & imaging results that were available during my care of the patient were reviewed by me and considered in my medical decision making (see chart for details).    MDM Rules/Calculators/A&P                          70 y/o male presenting for eval of sob and generalized weakness x1 month  Reviewed/interpreted labs CBC with mild leukopenia which  appears chronic, no anemia CMPwith hyponatremia with a sodium of 125, ast is slightly worse than prior and bilirubin is slightly worse than prior  - pt has no abd ttp to suggest emergent biliary pathology of sxs Trop neg, delta pending on admission BNP marginally eleavted INR 3.6, slightly supratheraputic  COVID negative UA pending on admission   EKG - Atrial fibrillation RBBB and LAFB No significant change since last tracing  Reviewed/interpreted imaging CXR - Atrial flutter with predominant 4:1 AV block RBBB and LAFB   Pt here for generalized weakness and DOE. Labs reveal hyponatremia which has worsened since his last visit to the ED. Unclear cause of hyponatremia at this time, urine studies are pending. Pt appears euvolemic to slightly fluid overloaded so we will hold ivf until this results. If his symptoms persist after normalization of his sodium, I would consider cardiology consultation. Prior records indicate pt had recent cards eval where echo and stress test were recommended. Echo on 12/20/2020 revealed normal sysolic function, no diaslotic dysfunction and new mild aortic stenosis. He has not had a stress test yet. He may benefit for expedited stress test to r/o unstable angina as an underlying pathology of his current symptoms.   Case discussed with Dr. Alvino Chapel who is in agreement with the plan for admission   12:09 PM CONSULT with Dr. Sherral Hammers who accepts patient for admission   Final Clinical Impression(s) / ED Diagnoses Final diagnoses:  Hyponatremia  Dyspnea on exertion    Rx / DC Orders ED Discharge Orders     None        Bishop Dublin 01/08/21 1219    Davonna Belling, MD 01/08/21 1517

## 2021-01-08 NOTE — H&P (Signed)
[facility] History and Physical  Roy [name] [id number] DOB: 1950-08-13 DOA: 01/08/2021  Referring physician:  PCP: Bridget Hartshorn, NP   Chief Complaint: Increasing generalized weakness over the last month  HPI: Roy [name] is a 70 y.o. WM PMHx afib on chronic anticoagulation, trifascicular block, left anterior fascicular block, RBBB essential HTN, gerd, HLD, obesity,   Presents to the ED today for eval of generalized weakness for the last month. States he has felt sob for the last few weeks as well. Report diarrhea x2 days. Denies ble swelling, cough, chest pain, vomiting, abd pain, bloody stools, fevers. Sxs constant and have worsened since onset. Denies exacerbating or alleviating factors.  States has not been eating for~30 days (just has not felt hungry)    Review of Systems:  Covid vaccination;  Constitutional:  No weight loss, night sweats, Fevers, chills, fatigue.  HEENT:  No headaches, Difficulty swallowing,Tooth/dental problems,Sore throat,  No sneezing, itching, ear ache, nasal congestion, post nasal drip,  Cardio-vascular:  No chest pain, Orthopnea, PND, swelling in lower extremities, anasarca, dizziness, palpitations  GI:  No heartburn, indigestion, abdominal pain, nausea, vomiting, diarrhea, change in bowel habits, loss of appetite  Resp:  No shortness of breath with exertion or at rest. No excess mucus, no productive cough, No non-productive cough, No coughing up of blood.No change in color of mucus.No wheezing.No chest wall deformity  Skin:  no rash or lesions.  GU:  no dysuria, change in color of urine, no urgency or frequency. No flank pain.  Musculoskeletal:  No joint pain or swelling. No decreased range of motion. No back pain.  Psych:  No change in mood or affect. No depression or anxiety. No memory loss.   Past Medical History:  Diagnosis Date   Atrial fibrillation ([facility])    GERD (gastroesophageal reflux disease)    High  cholesterol    Hypertension    Obesity    Past Surgical History:  Procedure Laterality Date   APPENDECTOMY     CATARACT EXTRACTION Bilateral 2022   CHOLECYSTECTOMY     ESOPHAGEAL DILATION     multiple times   JOINT REPLACEMENT Left    hip   Social History:  reports that he has never smoked. He has never used smokeless tobacco. He reports that he does not drink alcohol and does not use drugs.  Allergies  Allergen Reactions   Adhesive [Tape]    Sulfa Antibiotics     UNKNOWN Childhood reaction    Family History  Problem Relation Age of Onset   Heart failure Father    Heart attack Father        Deceased    Thyroid disease Sister     Prior to Admission medications   Medication Sig Start Date End Date Taking? Authorizing Provider  amLODipine (NORVASC) 2.5 MG tablet Take 1 tablet (2.5 mg total) by mouth daily. Patient not taking: Reported on 01/02/2021 10/24/20   [name], MD  Calcium Carbonate-Vitamin D (CALCIUM-VITAMIN D) 600-125 MG-UNIT TABS Take by mouth.    [provider]  Cholecalciferol (VITAMIN D3) 25 MCG (1000 UT) CAPS Take by mouth daily.    [provider]  magnesium oxide (MAG-OX) 400 (240 Mg) MG tablet Take 1 tablet (400 mg total) by mouth 2 (two) times daily. 10/24/20   [name], MD  magnesium oxide (MAG-OX) 400 MG tablet Take by mouth. Patient not taking: Reported on 01/02/2021 12/19/20 01/18/21  [provider]  meclizine (ANTIVERT) 12.5 MG tablet Take 1  tablet (12.5 mg total) by mouth 3 (three) times daily as needed for dizziness. 01/02/21   Marcello Fennel, PA-C  omeprazole (PRILOSEC) 10 MG capsule Take 1 capsule by mouth 2 (two) times daily. 11/15/20   [provider]  potassium chloride (KLOR-CON) 10 MEQ tablet Take 10 mEq by mouth 2 (two) times daily. 11/04/20   [provider]  potassium chloride (KLOR-CON) 10 MEQ tablet Take by mouth. Patient not taking: Reported on 01/02/2021 12/13/20 01/12/21  [provider]  rosuvastatin (CRESTOR) 5 MG tablet Take 1 tablet by mouth at bedtime. 08/23/20   [provider]  warfarin (COUMADIN) 5 MG tablet Take 1 tablet (5 mg total) by mouth daily at 4 PM. 10/24/20   [name], MD     Consultants:    Procedures/Significant Events:    I have personally reviewed and interpreted all radiology studies and my findings are as above.   VENTILATOR SETTINGS:    Cultures 9/4 SARS coronavirus negative 9/4 influenza A/B negative  Antimicrobials:    Devices    LINES / TUBES:      Continuous Infusions:  Physical Exam: Vitals:   01/08/21 1600 01/08/21 1630 01/08/21 1635 01/08/21 1701  BP: 101/62 (!) 101/57  90/60  Pulse: (!) 54 (!) 53  (!) 58  Resp: 17 (!) 21  20  Temp:   97.6 F (36.4 C) 97.9 F (36.6 C)  TempSrc:    Oral  SpO2: 95% 97%  99%  Weight:      Height:        Wt Readings from Last 3 Encounters:  01/08/21 113.4 kg  12/12/20 119.3 kg  11/28/20 119.5 kg    General: A/O x4, No acute respiratory distress Eyes: negative scleral hemorrhage, negative anisocoria, negative icterus ENT: Negative Runny nose, negative gingival bleeding, Neck:  Negative scars, masses, torticollis, lymphadenopathy, JVD Lungs: Clear to auscultation bilaterally without wheezes or crackles Cardiovascular: Regular rate and rhythm without murmur gallop or rub normal S1 and S2 Abdomen: negative abdominal pain, nondistended, positive soft, bowel sounds, no rebound, no ascites, no appreciable mass Extremities: No significant cyanosis, clubbing, or edema bilateral lower extremities Skin: Negative rashes, lesions, ulcers Psychiatric:  Negative depression, negative anxiety, negative fatigue, negative mania  Central nervous system:  Cranial nerves II through XII intact, tongue/uvula midline, all extremities muscle strength 5/5, sensation intact throughout, negative dysarthria, negative expressive aphasia, negative receptive aphasia.        Labs on  Admission:  Basic Metabolic Panel: Recent Labs  Lab 01/02/21 1100 01/08/21 0958  NA 131* 125*  K 4.2 3.8  CL 96* 89*  CO2 25 25  GLUCOSE 96 86  BUN 13 7*  CREATININE 0.85 0.71  CALCIUM 9.8 9.4  MG 1.8  --    Liver Function Tests: Recent Labs  Lab 01/02/21 1100 01/08/21 0958  AST 107* 119*  ALT 17 15  ALKPHOS 61 65  BILITOT 2.3* 3.0*  PROT 6.6 6.6  ALBUMIN 3.8 3.8   No results for input(s): LIPASE, AMYLASE in the last 168 hours. No results for input(s): AMMONIA in the last 168 hours. CBC: Recent Labs  Lab 01/02/21 1100 01/08/21 0958  WBC 3.8* 3.6*  NEUTROABS 1.6*  --   HGB 15.8 15.8  HCT 46.2 44.1  MCV 90.8 87.8  PLT 183 177   Cardiac Enzymes: No results for input(s): CKTOTAL, CKMB, CKMBINDEX, TROPONINI in the last 168 hours.  BNP (last 3 results) Recent Labs    01/08/21 0958  BNP  177.0*    ProBNP (last 3 results) No results for input(s): PROBNP in the last 8760 hours.  CBG: Recent Labs  Lab 01/02/21 1043 01/08/21 1017  GLUCAP 105* 89    Radiological Exams on Admission: DG Chest Portable 1 View  Result Date: 01/08/2021 CLINICAL DATA:  Dyspnea, weakness, diarrhea EXAM: PORTABLE CHEST 1 VIEW COMPARISON:  03/26/2018 chest radiograph. FINDINGS: Stable cardiomediastinal silhouette with top-normal heart size. No pneumothorax. No pleural effusion. Lungs appear clear, with no acute consolidative airspace disease and no pulmonary edema. IMPRESSION: No active disease. Electronically Signed   By: Ilona Sorrel M.D.   On: 01/08/2021 10:17    EKG: Independently reviewed.   Assessment/Plan Active Problems:   PAF (paroxysmal atrial fibrillation) ([facility])   RBBB   First degree AV block   Essential hypertension   Trifascicular block   LAFB (left anterior fascicular block)   Obesity (BMI 30-39.9)   Chronic anticoagulation   Hyperthyroidism   Hyponatremia   Weakness  Weakness - May be multifactorial to include bradycardia, hypotension, heart failure?,   Elevated pulmonary arterial pressure -Will hold all BP medication, and any other medication which would lower BP. - Echocardiogram pending -Free T4 pending - AM cortisol pending -Normal saline 136m/hr  Symptomatic bradycardia - Atropine 0.4 mg x 1 -Await findings of echocardiogram - [facility].  Paroxysmal atrial fibrillation - Currently NSR -Warfarin per pharmacy Lab Results  Component Value Date   INR 3.6 (H) 01/08/2021   INR 3.6 (H) 01/02/2021   INR 3.8 (H) 10/24/2020   INR 5.1 (HH) 10/23/2020   INR 5.7 (HH) 10/22/2020    Hyponatremia - Most likely multifactorial anorexia, dehydration -Treat underlying cause     Code Status: Full (DVT Prophylaxis: Warfarin Family Communication:   Status is: Inpatient    Dispo: The patient is from: Home              Anticipated d/c is to: Home              Anticipated d/c date is: 3 days              Patient currently is not medically stable to d/c.     Data Reviewed: Care during the described time interval was provided by me .  I have reviewed this patient's available data, including medical history, events of note, physical examination, and all test results as part of my evaluation.   The patient is critically ill with multiple organ systems failure and requires high complexity decision making for assessment and support, frequent evaluation and titration of therapies, application of advanced monitoring technologies and extensive interpretation of multiple databases. Critical Care Time devoted to patient care services described in this note  Time spent: 70 minutes   Kyrie Fludd, [facility]

## 2021-01-08 NOTE — Progress Notes (Signed)
Pt admitted to 327 via wheelchair. A&O x 4. Walked to bathroom with minimal assistance. Pt welcomed and oriented to unit. Stated he did not need to call anyone to let them know he has been admitted to floor. Pt eating meal from dietary at this time.

## 2021-01-08 NOTE — ED Triage Notes (Signed)
Pt presents to ED with complaints of generalized weakness, SOB and diarrhea x 2 days.

## 2021-01-08 NOTE — Progress Notes (Signed)
ANTICOAGULATION CONSULT NOTE - Initial Up Consult   Pharmacy Consult for warfarin dosing  Indication: atrial fibrillation   Allergies  Allergen Reactions   Adhesive [Tape]    Sulfa Antibiotics     UNKNOWN Childhood reaction      Patient Measurements: Last Weight  Most recent update: 01/08/2021  9:38 AM    Weight  113.4 kg (250 lb)            Body mass index is 33.91 kg/m. [name]               Temp: 97 F (36.1 C) (09/04 0937) Temp Source: Oral (09/04 0937) BP: 91/58 (09/04 1200) Pulse Rate: 57 (09/04 1200)  Labs: Recent Labs    01/08/21 0958  HGB 15.8  HCT 44.1  PLT 177  LABPROT 35.9*  INR 3.6*  CREATININE 0.71    Estimated Creatinine Clearance: 111.7 mL/min (by C-G formula based on SCr of 0.71 mg/dL).     Medications:  (Not in a hospital admission)  Scheduled:   potassium chloride  10 mEq Oral BID   rosuvastatin  5 mg Oral QHS   Infusions:  PRN:  Anti-infectives (From admission, onward)    None       Goal of Therapy:  INR 2-3 Monitor platelets by anticoagulation protocol: Yes    Prior to Admission Warfarin Dosing:  [name] takes '5mg'$  of warfarin daily     Admit INR was 3.6 Lab Results  Component Value Date   INR 3.6 (H) 01/08/2021   INR 3.6 (H) 01/02/2021   INR 3.8 (H) 10/24/2020    Assessment: Roy [name] a 70 y.o. male requires anticoagulation with warfarin for the indication of  atrial fibrillation. Warfarin will be initiated inpatient following pharmacy protocol per pharmacy consult. Patient most recent blood work is as follows: CBC Latest Ref Rng & Units 01/08/2021 01/02/2021 11/11/2020  WBC 4.0 - 10.5 K/uL 3.6(L) 3.8(L) 4.1  Hemoglobin 13.0 - 17.0 g/dL 15.8 15.8 17.3(H)  Hematocrit 39.0 - 52.0 % 44.1 46.2 50.2  Platelets 150 - 400 K/uL 177 183 234     Plan: Hold warfarin x 1 dose tonight  Monitor CBC daily with am labs   Monitor INR daily Monitor for signs and symptoms of bleeding   Donna Christen Gerline Ratto, PharmD,  MBA, BCGP Clinical Pharmacist

## 2021-01-09 ENCOUNTER — Observation Stay (HOSPITAL_COMMUNITY): Payer: Medicare Other

## 2021-01-09 DIAGNOSIS — I10 Essential (primary) hypertension: Secondary | ICD-10-CM | POA: Diagnosis not present

## 2021-01-09 DIAGNOSIS — I5021 Acute systolic (congestive) heart failure: Secondary | ICD-10-CM | POA: Diagnosis not present

## 2021-01-09 DIAGNOSIS — Z7901 Long term (current) use of anticoagulants: Secondary | ICD-10-CM | POA: Diagnosis not present

## 2021-01-09 DIAGNOSIS — R0600 Dyspnea, unspecified: Secondary | ICD-10-CM

## 2021-01-09 DIAGNOSIS — M542 Cervicalgia: Secondary | ICD-10-CM

## 2021-01-09 DIAGNOSIS — E871 Hypo-osmolality and hyponatremia: Secondary | ICD-10-CM | POA: Diagnosis not present

## 2021-01-09 LAB — CORTISOL-AM, BLOOD: Cortisol - AM: <0.4 ug/dL — ABNORMAL LOW (ref 6.7–22.6)

## 2021-01-09 LAB — COMPREHENSIVE METABOLIC PANEL
ALT: 17 U/L (ref 0–44)
AST: 109 U/L — ABNORMAL HIGH (ref 15–41)
Albumin: 3.6 g/dL (ref 3.5–5.0)
Alkaline Phosphatase: 61 U/L (ref 38–126)
Anion gap: 10 (ref 5–15)
BUN: 8 mg/dL (ref 8–23)
CO2: 27 mmol/L (ref 22–32)
Calcium: 9.3 mg/dL (ref 8.9–10.3)
Chloride: 90 mmol/L — ABNORMAL LOW (ref 98–111)
Creatinine, Ser: 0.66 mg/dL (ref 0.61–1.24)
GFR, Estimated: >60 mL/min (ref >60)
Glucose, Bld: 78 mg/dL (ref 70–99)
Potassium: 4 mmol/L (ref 3.5–5.1)
Sodium: 127 mmol/L — ABNORMAL LOW (ref 135–145)
Total Bilirubin: 3 mg/dL — ABNORMAL HIGH (ref 0.3–1.2)
Total Protein: 6.2 g/dL — ABNORMAL LOW (ref 6.5–8.1)

## 2021-01-09 LAB — CBC WITH DIFFERENTIAL/PLATELET
Abs Immature Granulocytes: 0.01 10*3/uL (ref 0.00–0.07)
Basophils Absolute: 0 10*3/uL (ref 0.0–0.1)
Basophils Relative: 1 %
Eosinophils Absolute: 0.1 10*3/uL (ref 0.0–0.5)
Eosinophils Relative: 2 %
HCT: 41.8 % (ref 39.0–52.0)
Hemoglobin: 14.8 g/dL (ref 13.0–17.0)
Immature Granulocytes: 0 %
Lymphocytes Relative: 50 %
Lymphs Abs: 1.4 10*3/uL (ref 0.7–4.0)
MCH: 31.2 pg (ref 26.0–34.0)
MCHC: 35.4 g/dL (ref 30.0–36.0)
MCV: 88 fL (ref 80.0–100.0)
Monocytes Absolute: 0.2 10*3/uL (ref 0.1–1.0)
Monocytes Relative: 9 %
Neutro Abs: 1.1 10*3/uL — ABNORMAL LOW (ref 1.7–7.7)
Neutrophils Relative %: 38 %
Platelets: 170 10*3/uL (ref 150–400)
RBC: 4.75 MIL/uL (ref 4.22–5.81)
RDW: 13.9 % (ref 11.5–15.5)
WBC: 2.8 10*3/uL — ABNORMAL LOW (ref 4.0–10.5)
nRBC: 0 % (ref 0.0–0.2)

## 2021-01-09 LAB — PROTIME-INR
INR: 3.4 — ABNORMAL HIGH (ref 0.8–1.2)
Prothrombin Time: 34.1 seconds — ABNORMAL HIGH (ref 11.4–15.2)

## 2021-01-09 LAB — OSMOLALITY, URINE: Osmolality, Ur: 254 mOsm/kg — ABNORMAL LOW (ref 300–900)

## 2021-01-09 LAB — PHOSPHORUS: Phosphorus: 3.2 mg/dL (ref 2.5–4.6)

## 2021-01-09 LAB — MAGNESIUM: Magnesium: 1.6 mg/dL — ABNORMAL LOW (ref 1.7–2.4)

## 2021-01-09 LAB — T4, FREE: Free T4: 0.74 ng/dL (ref 0.61–1.12)

## 2021-01-09 LAB — SODIUM, URINE, RANDOM: Sodium, Ur: 44 mmol/L

## 2021-01-09 LAB — CREATININE, URINE, RANDOM: Creatinine, Urine: 92.61 mg/dL

## 2021-01-09 MED ORDER — ATROPINE SULFATE 1 MG/10ML IJ SOSY
0.4000 mg | PREFILLED_SYRINGE | Freq: Once | INTRAMUSCULAR | Status: AC
  Administered 2021-01-09: 0.4 mg via INTRAVENOUS
  Filled 2021-01-09: qty 10

## 2021-01-09 MED ORDER — MIDODRINE HCL 5 MG PO TABS
5.0000 mg | ORAL_TABLET | Freq: Three times a day (TID) | ORAL | Status: DC
  Administered 2021-01-09 – 2021-01-13 (×14): 5 mg via ORAL
  Filled 2021-01-09 (×12): qty 1

## 2021-01-09 MED ORDER — TRAMADOL HCL 50 MG PO TABS
50.0000 mg | ORAL_TABLET | Freq: Once | ORAL | Status: AC
  Administered 2021-01-09: 50 mg via ORAL
  Filled 2021-01-09: qty 1

## 2021-01-09 MED ORDER — SODIUM CHLORIDE 0.9 % IV SOLN: INTRAVENOUS | Status: DC

## 2021-01-09 MED ORDER — MAGNESIUM SULFATE 2 GM/50ML IV SOLN
2.0000 g | Freq: Once | INTRAVENOUS | Status: AC
  Administered 2021-01-09: 2 g via INTRAVENOUS
  Filled 2021-01-09: qty 50

## 2021-01-09 MED ORDER — ATROPINE SULFATE 1 MG/10ML IJ SOSY: 0.4000 mg | PREFILLED_SYRINGE | Freq: Once | INTRAMUSCULAR | Status: DC

## 2021-01-09 NOTE — Progress Notes (Signed)
*  PRELIMINARY RESULTS* Echocardiogram 2D Echocardiogram has been performed.  Elpidio Anis 01/09/2021, 9:27 AM

## 2021-01-09 NOTE — Care Management Obs Status (Signed)
[facility] NOTIFICATION   Patient Details  Name: Roy [name] MRN: [id number] Date of Birth: 1950-11-01   Medicare Observation Status Notification Given:  Yes    [name] 01/09/2021, 2:16 PM

## 2021-01-09 NOTE — Progress Notes (Signed)
PROGRESS NOTE    JAMMAL KHAM  [id number] DOB: 1950/06/09 DOA: 01/08/2021 PCP: Bridget Hartshorn, NP   Brief Narrative:   LONNELL BEA is a 70 y.o. WM PMHx afib on chronic anticoagulation, trifascicular block, left anterior fascicular block, RBBB essential HTN, gerd, HLD, obesity,    Presents to the ED today for eval of generalized weakness for the last month. States he has felt sob for the last few weeks as well. Report diarrhea x2 days. Denies ble swelling, cough, chest pain, vomiting, abd pain, bloody stools, fevers. Sxs constant and have worsened since onset. Denies exacerbating or alleviating factors.  States has not been eating for~30 days (just has not felt hungry)   Subjective: A/O x4, sitting in chair comfortably.  Patient still bradycardic but says negative symptoms right at this moment.   Assessment & Plan:  Covid vaccination;  Active Problems:   PAF (paroxysmal atrial fibrillation) (HCC)   RBBB   First degree AV block   Essential hypertension   Trifascicular block   LAFB (left anterior fascicular block)   Obesity (BMI 30-39.9)   Chronic anticoagulation   Hyperthyroidism   Hyponatremia   Weakness   Weakness - May be multifactorial to include bradycardia, hypotension, heart failure?,  Elevated pulmonary arterial pressure -Will hold all BP medication, and any other medication which would lower BP. - Echocardiogram pending -Free T4 pending - AM cortisol pending -Normal saline 167m/hr   Symptomatic bradycardia - Atropine 0.4 mg x 1 -Await findings of echocardiogram - [facility] -9/5 orthostatic vitals q shift -9/5 atropine 0.4 mg x 1: Sustained/symptomatic bradycardia  Paroxysmal atrial fibrillation - Currently NSR -Warfarin per pharmacy Lab Results  Component Value Date   INR 3.4 (H) 01/09/2021   INR 3.6 (H) 01/08/2021   INR 3.6 (H) 01/02/2021   INR 3.8 (H) 10/24/2020    INR 5.1 (HH) 10/23/2020   Hypotension - 9/5 AM cortisol pending - 9/5 TSH pending - 9/5 Midodrine 5 mg TID   Hyponatremia - Most likely multifactorial anorexia, dehydration -Treat underlying cause Lab Results  Component Value Date   NA 127 (L) 01/09/2021   NA 125 (L) 01/08/2021   NA 131 (L) 01/02/2021   NA 135 11/11/2020   NA 137 10/24/2020    Hypomagnesmia - Magnesium goal> 2 - Magnesium IV 2 g   DVT prophylaxis: Coumadin Code Status: Full Family Communication:  Status is: Inpatient    Dispo: The patient is from: Home              Anticipated d/c is to: Home              Anticipated d/c date is: 3 days              Patient currently is not medically stable to d/c.      Consultants:    Procedures/Significant Events:  9/4 SARS coronavirus negative 9/4 influenza A/B negative   I have personally reviewed and interpreted all radiology studies and my findings are as above.  VENTILATOR SETTINGS:    Cultures   Antimicrobials:    Devices    LINES / TUBES:      Continuous Infusions:   Objective: Vitals:   01/08/21 1701 01/08/21 2058 01/09/21 0051 01/09/21 0443  BP: 90/60 111/75 117/77 (!) 95/50  Pulse: (!) 58 65 60 (!) 55  Resp: '20 18 18 18  '$ Temp: 97.9 F (36.6 C) 97.6 F (36.4  C) 97.6 F (36.4 C) 97.6 F (36.4 C)  TempSrc: Oral Oral Oral Oral  SpO2: 99% 93% 95% 97%  Weight:      Height:        Intake/Output Summary (Last 24 hours) at 01/09/2021 1333 Last data filed at 01/09/2021 0900 Gross per 24 hour  Intake 1400 ml  Output 2 ml  Net 1398 ml   Filed Weights   01/08/21 0937  Weight: 113.4 kg    Examination:  General: A/O x4, No acute respiratory distress Eyes: negative scleral hemorrhage, negative anisocoria, negative icterus ENT: Negative Runny nose, negative gingival bleeding, Neck:  Negative scars, masses, torticollis, lymphadenopathy, JVD Lungs: Clear to auscultation bilaterally without wheezes or  crackles Cardiovascular: Regular rate and rhythm without murmur gallop or rub normal S1 and S2 Abdomen: negative abdominal pain, nondistended, positive soft, bowel sounds, no rebound, no ascites, no appreciable mass Extremities: No significant cyanosis, clubbing, or edema bilateral lower extremities Skin: Negative rashes, lesions, ulcers Psychiatric:  Negative depression, negative anxiety, negative fatigue, negative mania  Central nervous system:  Cranial nerves II through XII intact, tongue/uvula midline, all extremities muscle strength 5/5, sensation intact throughout, negative dysarthria, negative expressive aphasia, negative receptive aphasia.  .     Data Reviewed: Care during the described time interval was provided by me .  I have reviewed this patient's available data, including medical history, events of note, physical examination, and all test results as part of my evaluation.   CBC: Recent Labs  Lab 01/08/21 0958 01/09/21 0455  WBC 3.6* 2.8*  NEUTROABS  --  1.1*  HGB 15.8 14.8  HCT 44.1 41.8  MCV 87.8 88.0  PLT 177 [id number]   Basic Metabolic Panel: Recent Labs  Lab 01/08/21 0958 01/09/21 0455  NA 125* 127*  K 3.8 4.0  CL 89* 90*  CO2 25 27  GLUCOSE 86 78  BUN 7* 8  CREATININE 0.71 0.66  CALCIUM 9.4 9.3  MG  --  1.6*  PHOS  --  3.2   GFR: Estimated Creatinine Clearance: 111.7 mL/min (by C-G formula based on SCr of 0.66 mg/dL). Liver Function Tests: Recent Labs  Lab 01/08/21 0958 01/09/21 0455  AST 119* 109*  ALT 15 17  ALKPHOS 65 61  BILITOT 3.0* 3.0*  PROT 6.6 6.2*  ALBUMIN 3.8 3.6   No results for input(s): LIPASE, AMYLASE in the last 168 hours. No results for input(s): AMMONIA in the last 168 hours. Coagulation Profile: Recent Labs  Lab 01/08/21 0958 01/09/21 0455  INR 3.6* 3.4*   Cardiac Enzymes: No results for input(s): CKTOTAL, CKMB, CKMBINDEX, TROPONINI in the last 168 hours. BNP (last 3 results) No results for input(s): PROBNP in the  last 8760 hours. HbA1C: No results for input(s): HGBA1C in the last 72 hours. CBG: Recent Labs  Lab 01/08/21 1017  GLUCAP 89   Lipid Profile: No results for input(s): CHOL, HDL, LDLCALC, TRIG, CHOLHDL, LDLDIRECT in the last 72 hours. Thyroid Function Tests: Recent Labs    01/08/21 0958  TSH 0.036*   Anemia Panel: No results for input(s): VITAMINB12, FOLATE, FERRITIN, TIBC, IRON, RETICCTPCT in the last 72 hours. Urine analysis:    Component Value Date/Time   COLORURINE YELLOW 01/08/2021 [place] 01/08/2021 1032   LABSPEC 1.020 01/08/2021 1032   PHURINE 5.5 01/08/2021 [place] 01/08/2021 1032   HGBUR NEGATIVE 01/08/2021 [place] 01/08/2021 1032   [place] 01/08/2021 [place] 01/08/2021 1032  UROBILINOGEN 1.0 05/15/2010 2059   NITRITE NEGATIVE 01/08/2021 1032   LEUKOCYTESUR NEGATIVE 01/08/2021 1032   Sepsis Labs: '@LABRCNTIP'$ (procalcitonin:4,lacticidven:4)  ) Recent Results (from the past 240 hour(s))  Resp Panel by RT-PCR (Flu A&B, Covid) Nasopharyngeal Swab     Status: None   Collection Time: 01/02/21 11:51 AM   Specimen: Nasopharyngeal Swab; Nasopharyngeal(NP) swabs in vial transport medium  Result Value Ref Range Status   SARS Coronavirus 2 by RT PCR NEGATIVE NEGATIVE Final    Comment: (NOTE) SARS-CoV-2 target nucleic acids are NOT DETECTED.  The SARS-CoV-2 RNA is generally detectable in upper respiratory specimens during the acute phase of infection. The lowest concentration of SARS-CoV-2 viral copies this assay can detect is 138 copies/mL. A negative result does not preclude SARS-Cov-2 infection and should not be used as the sole basis for treatment or other patient management decisions. A negative result may occur with  improper specimen collection/handling, submission of specimen other than nasopharyngeal swab, presence of viral mutation(s) within the areas targeted by this  assay, and inadequate number of viral copies(<138 copies/mL). A negative result must be combined with clinical observations, patient history, and epidemiological information. The expected result is Negative.  Fact Sheet for Patients:  EntrepreneurPulse.com.au  Fact Sheet for Healthcare Providers:  IncredibleEmployment.be  This test is no t yet approved or cleared by the [place] FDA and  has been authorized for detection and/or diagnosis of SARS-CoV-2 by FDA under an Emergency Use Authorization (EUA). This EUA will remain  in effect (meaning this test can be used) for the duration of the COVID-19 declaration under Section 564(b)(1) of the Act, 21 U.S.C.section 360bbb-3(b)(1), unless the authorization is terminated  or revoked sooner.       Influenza A by PCR NEGATIVE NEGATIVE Final   Influenza B by PCR NEGATIVE NEGATIVE Final    Comment: (NOTE) The Xpert Xpress SARS-CoV-2/FLU/RSV plus assay is intended as an aid in the diagnosis of influenza from Nasopharyngeal swab specimens and should not be used as a sole basis for treatment. Nasal washings and aspirates are unacceptable for Xpert Xpress SARS-CoV-2/FLU/RSV testing.  Fact Sheet for Patients: EntrepreneurPulse.com.au  Fact Sheet for Healthcare Providers: IncredibleEmployment.be  This test is not yet approved or cleared by the [place] FDA and has been authorized for detection and/or diagnosis of SARS-CoV-2 by FDA under an Emergency Use Authorization (EUA). This EUA will remain in effect (meaning this test can be used) for the duration of the COVID-19 declaration under Section 564(b)(1) of the Act, 21 U.S.C. section 360bbb-3(b)(1), unless the authorization is terminated or revoked.  Performed at [facility], [address], [place], [place] [address]   Resp Panel by RT-PCR (Flu A&B, Covid) Nasopharyngeal Swab     Status: None   Collection  Time: 01/08/21  9:51 AM   Specimen: Nasopharyngeal Swab; Nasopharyngeal(NP) swabs in vial transport medium  Result Value Ref Range Status   SARS Coronavirus 2 by RT PCR NEGATIVE NEGATIVE Final    Comment: (NOTE) SARS-CoV-2 target nucleic acids are NOT DETECTED.  The SARS-CoV-2 RNA is generally detectable in upper respiratory specimens during the acute phase of infection. The lowest concentration of SARS-CoV-2 viral copies this assay can detect is 138 copies/mL. A negative result does not preclude SARS-Cov-2 infection and should not be used as the sole basis for treatment or other patient management decisions. A negative result may occur with  improper specimen collection/handling, submission of specimen other than nasopharyngeal swab, presence of viral mutation(s) within the areas targeted by this  assay, and inadequate number of viral copies(<138 copies/mL). A negative result must be combined with clinical observations, patient history, and epidemiological information. The expected result is Negative.  Fact Sheet for Patients:  EntrepreneurPulse.com.au  Fact Sheet for Healthcare Providers:  IncredibleEmployment.be  This test is no t yet approved or cleared by the [place] FDA and  has been authorized for detection and/or diagnosis of SARS-CoV-2 by FDA under an Emergency Use Authorization (EUA). This EUA will remain  in effect (meaning this test can be used) for the duration of the COVID-19 declaration under Section 564(b)(1) of the Act, 21 U.S.C.section 360bbb-3(b)(1), unless the authorization is terminated  or revoked sooner.       Influenza A by PCR NEGATIVE NEGATIVE Final   Influenza B by PCR NEGATIVE NEGATIVE Final    Comment: (NOTE) The Xpert Xpress SARS-CoV-2/FLU/RSV plus assay is intended as an aid in the diagnosis of influenza from Nasopharyngeal swab specimens and should not be used as a sole basis for treatment. Nasal washings  and aspirates are unacceptable for Xpert Xpress SARS-CoV-2/FLU/RSV testing.  Fact Sheet for Patients: EntrepreneurPulse.com.au  Fact Sheet for Healthcare Providers: IncredibleEmployment.be  This test is not yet approved or cleared by the [place] FDA and has been authorized for detection and/or diagnosis of SARS-CoV-2 by FDA under an Emergency Use Authorization (EUA). This EUA will remain in effect (meaning this test can be used) for the duration of the COVID-19 declaration under Section 564(b)(1) of the Act, 21 U.S.C. section 360bbb-3(b)(1), unless the authorization is terminated or revoked.  Performed at [facility], [address] [place], [place] [address]   Culture, blood (routine x 2)     Status: None (Preliminary result)   Collection Time: 01/08/21  1:13 PM   Specimen: BLOOD LEFT HAND  Result Value Ref Range Status   Specimen Description   Final    BLOOD LEFT HAND BOTTLES DRAWN AEROBIC AND ANAEROBIC   Special Requests PENDING  Incomplete   Culture   Final    NO GROWTH < 24 HOURS Performed at [facility], [address], [place], [place] [address]    Report Status PENDING  Incomplete  Culture, blood (routine x 2)     Status: None (Preliminary result)   Collection Time: 01/08/21  1:13 PM   Specimen: Left Antecubital; Blood  Result Value Ref Range Status   Specimen Description   Final    LEFT ANTECUBITAL BOTTLES DRAWN AEROBIC AND ANAEROBIC   Special Requests Blood Culture adequate volume  Final   Culture   Final    NO GROWTH < 24 HOURS Performed at [facility], [address], [place], [place] [address]    Report Status PENDING  Incomplete         Radiology Studies: DG Chest Portable 1 View  Result Date: 01/08/2021 CLINICAL DATA:  Dyspnea, weakness, diarrhea EXAM: PORTABLE CHEST 1 VIEW COMPARISON:  03/26/2018 chest radiograph. FINDINGS: Stable cardiomediastinal silhouette with top-normal heart size. No pneumothorax. No  pleural effusion. Lungs appear clear, with no acute consolidative airspace disease and no pulmonary edema. IMPRESSION: No active disease. Electronically Signed   By: Ilona Sorrel M.D.   On: 01/08/2021 10:17        Scheduled Meds:  feeding supplement  237 mL Oral BID BM   potassium chloride  10 mEq Oral BID   rosuvastatin  5 mg Oral QHS   Warfarin - Pharmacist Dosing Inpatient   Does not apply q1600   Continuous Infusions:   LOS: 0  days   The patient is critically ill with multiple organ systems failure and requires high complexity decision making for assessment and support, frequent evaluation and titration of therapies, application of advanced monitoring technologies and extensive interpretation of multiple databases. Critical Care Time devoted to patient care services described in this note  Time spent: 40 minutes     Aubriella Perezgarcia, Geraldo Docker, MD [facility]   If 7PM-7AM, please contact night-coverage 01/09/2021, 1:33 PM

## 2021-01-09 NOTE — Progress Notes (Signed)
Patient complaining of pain 10/10 in neck, states that he had "some kind of test" done on his thyroid 3 weeks ago, and that he was laying on a table. States he has had pain in his neck since then. Paged Dr. Sherral Hammers with new orders.

## 2021-01-09 NOTE — Progress Notes (Addendum)
Called night time doctor to clarify dose of atropine.  Daytime provider still present who ordered med.  Received verification to give medication.  Patient currently has no complaints of shortness of breath, dizziness,   chest pain, and is alert and oriented x 4.  Vitals taken, and med given per MD order.

## 2021-01-09 NOTE — Progress Notes (Signed)
ANTICOAGULATION CONSULT NOTE - Initial Up Consult   Pharmacy Consult for warfarin dosing  Indication: atrial fibrillation   Allergies  Allergen Reactions   Adhesive [Tape]    Sulfa Antibiotics     UNKNOWN Childhood reaction      Patient Measurements: Last Weight  Most recent update: 01/08/2021  9:38 AM    Weight  113.4 kg (250 lb)            Body mass index is 33.91 kg/m. [name]               Temp: 97.6 F (36.4 C) (09/05 0443) Temp Source: Oral (09/05 0443) BP: 95/50 (09/05 0443) Pulse Rate: 55 (09/05 0443)  Labs: Recent Labs    01/08/21 0958 01/09/21 0455  HGB 15.8 14.8  HCT 44.1 41.8  PLT 177 170  LABPROT 35.9* 34.1*  INR 3.6* 3.4*  CREATININE 0.71 0.66     Estimated Creatinine Clearance: 111.7 mL/min (by C-G formula based on SCr of 0.66 mg/dL).     Medications:  Medications Prior to Admission  Medication Sig Dispense Refill Last Dose   Calcium Carbonate-Vitamin D (CALCIUM-VITAMIN D) 600-125 MG-UNIT TABS Take by mouth.   01/07/2021   Cholecalciferol (VITAMIN D3) 25 MCG (1000 UT) CAPS Take by mouth daily.   01/07/2021   magnesium oxide (MAG-OX) 400 (240 Mg) MG tablet Take 1 tablet (400 mg total) by mouth 2 (two) times daily. 60 tablet 1 01/07/2021   meclizine (ANTIVERT) 12.5 MG tablet Take 1 tablet (12.5 mg total) by mouth 3 (three) times daily as needed for dizziness. 30 tablet 0 01/07/2021   omeprazole (PRILOSEC) 10 MG capsule Take 1 capsule by mouth daily.   01/07/2021   potassium chloride (KLOR-CON) 10 MEQ tablet Take 10 mEq by mouth 2 (two) times daily.   01/07/2021   rosuvastatin (CRESTOR) 5 MG tablet Take 1 tablet by mouth at bedtime.   01/07/2021   warfarin (COUMADIN) 5 MG tablet Take 1 tablet (5 mg total) by mouth daily at 4 PM. (Patient taking differently: Take 5 mg by mouth every evening.)   01/07/2021 at 1600   amLODipine (NORVASC) 2.5 MG tablet Take 1 tablet (2.5 mg total) by mouth daily. (Patient not taking: No sig reported) 30 tablet 1    magnesium  oxide (MAG-OX) 400 MG tablet Take by mouth. (Patient not taking: No sig reported)      potassium chloride (KLOR-CON) 10 MEQ tablet Take by mouth. (Patient not taking: No sig reported)      Scheduled:   feeding supplement  237 mL Oral BID BM   potassium chloride  10 mEq Oral BID   rosuvastatin  5 mg Oral QHS   Warfarin - Pharmacist Dosing Inpatient   Does not apply q1600   Infusions:  PRN:  Anti-infectives (From admission, onward)    None       Goal of Therapy:  INR 2-3 Monitor platelets by anticoagulation protocol: Yes    Prior to Admission Warfarin Dosing:  [name] takes '5mg'$  of warfarin daily     Admit INR was 3.6 Lab Results  Component Value Date   INR 3.4 (H) 01/09/2021   INR 3.6 (H) 01/08/2021   INR 3.6 (H) 01/02/2021    Assessment: AANAV BREDBENNER a 70 y.o. male requires anticoagulation with warfarin for the indication of  atrial fibrillation. Warfarin will be initiated inpatient following pharmacy protocol per pharmacy consult. Patient most recent blood work is as follows: CBC Latest Ref Rng &  Units 01/09/2021 01/08/2021 01/02/2021  WBC 4.0 - 10.5 K/uL 2.8(L) 3.6(L) 3.8(L)  Hemoglobin 13.0 - 17.0 g/dL 14.8 15.8 15.8  Hematocrit 39.0 - 52.0 % 41.8 44.1 46.2  Platelets 150 - 400 K/uL 170 177 183   INR 3.4 today, supratherapeutic   Plan: Hold warfarin x 1 dose tonight  Monitor CBC daily with am labs   Monitor INR daily Monitor for signs and symptoms of bleeding   Donna Christen Areebah Meinders, PharmD, MBA, BCGP Clinical Pharmacist

## 2021-01-10 DIAGNOSIS — E2740 Unspecified adrenocortical insufficiency: Secondary | ICD-10-CM

## 2021-01-10 DIAGNOSIS — R0600 Dyspnea, unspecified: Secondary | ICD-10-CM | POA: Diagnosis not present

## 2021-01-10 DIAGNOSIS — Z7901 Long term (current) use of anticoagulants: Secondary | ICD-10-CM | POA: Diagnosis not present

## 2021-01-10 DIAGNOSIS — I10 Essential (primary) hypertension: Secondary | ICD-10-CM | POA: Diagnosis not present

## 2021-01-10 DIAGNOSIS — E871 Hypo-osmolality and hyponatremia: Secondary | ICD-10-CM | POA: Diagnosis not present

## 2021-01-10 LAB — COMPREHENSIVE METABOLIC PANEL
ALT: 16 U/L (ref 0–44)
AST: 117 U/L — ABNORMAL HIGH (ref 15–41)
Albumin: 3.9 g/dL (ref 3.5–5.0)
Alkaline Phosphatase: 65 U/L (ref 38–126)
Anion gap: 12 (ref 5–15)
BUN: 9 mg/dL (ref 8–23)
CO2: 26 mmol/L (ref 22–32)
Calcium: 9.7 mg/dL (ref 8.9–10.3)
Chloride: 91 mmol/L — ABNORMAL LOW (ref 98–111)
Creatinine, Ser: 0.68 mg/dL (ref 0.61–1.24)
GFR, Estimated: >60 mL/min (ref >60)
Glucose, Bld: 96 mg/dL (ref 70–99)
Potassium: 4.1 mmol/L (ref 3.5–5.1)
Sodium: 129 mmol/L — ABNORMAL LOW (ref 135–145)
Total Bilirubin: 2.5 mg/dL — ABNORMAL HIGH (ref 0.3–1.2)
Total Protein: 6.9 g/dL (ref 6.5–8.1)

## 2021-01-10 LAB — CBC WITH DIFFERENTIAL/PLATELET
Abs Immature Granulocytes: 0.01 10*3/uL (ref 0.00–0.07)
Basophils Absolute: 0 10*3/uL (ref 0.0–0.1)
Basophils Relative: 1 %
Eosinophils Absolute: 0.1 10*3/uL (ref 0.0–0.5)
Eosinophils Relative: 2 %
HCT: 46.8 % (ref 39.0–52.0)
Hemoglobin: 16.1 g/dL (ref 13.0–17.0)
Immature Granulocytes: 0 %
Lymphocytes Relative: 49 %
Lymphs Abs: 1.8 10*3/uL (ref 0.7–4.0)
MCH: 31.1 pg (ref 26.0–34.0)
MCHC: 34.4 g/dL (ref 30.0–36.0)
MCV: 90.3 fL (ref 80.0–100.0)
Monocytes Absolute: 0.4 10*3/uL (ref 0.1–1.0)
Monocytes Relative: 11 %
Neutro Abs: 1.4 10*3/uL — ABNORMAL LOW (ref 1.7–7.7)
Neutrophils Relative %: 37 %
Platelets: 198 10*3/uL (ref 150–400)
RBC: 5.18 MIL/uL (ref 4.22–5.81)
RDW: 14.3 % (ref 11.5–15.5)
WBC: 3.7 10*3/uL — ABNORMAL LOW (ref 4.0–10.5)
nRBC: 0 % (ref 0.0–0.2)

## 2021-01-10 LAB — PHOSPHORUS: Phosphorus: 3.9 mg/dL (ref 2.5–4.6)

## 2021-01-10 LAB — CORTISOL-AM, BLOOD: Cortisol - AM: 0.4 ug/dL — ABNORMAL LOW (ref 6.7–22.6)

## 2021-01-10 LAB — PROTIME-INR
INR: 2.6 — ABNORMAL HIGH (ref 0.8–1.2)
Prothrombin Time: 28.2 seconds — ABNORMAL HIGH (ref 11.4–15.2)

## 2021-01-10 LAB — HEMOGLOBIN A1C
Hgb A1c MFr Bld: 5.1 % (ref 4.8–5.6)
Mean Plasma Glucose: 100 mg/dL

## 2021-01-10 LAB — MAGNESIUM: Magnesium: 2 mg/dL (ref 1.7–2.4)

## 2021-01-10 MED ORDER — HYDROCORTISONE NA SUCCINATE PF 100 MG IJ SOLR
100.0000 mg | Freq: Three times a day (TID) | INTRAMUSCULAR | Status: DC
  Administered 2021-01-10 – 2021-01-11 (×3): 100 mg via INTRAVENOUS
  Filled 2021-01-10 (×3): qty 2

## 2021-01-10 MED ORDER — WARFARIN SODIUM 5 MG PO TABS
5.0000 mg | ORAL_TABLET | Freq: Once | ORAL | Status: AC
  Administered 2021-01-10: 5 mg via ORAL
  Filled 2021-01-10: qty 1

## 2021-01-10 MED ORDER — TRAMADOL HCL 50 MG PO TABS
50.0000 mg | ORAL_TABLET | Freq: Every evening | ORAL | Status: DC | PRN
  Administered 2021-01-11: 50 mg via ORAL
  Filled 2021-01-10 (×2): qty 1

## 2021-01-10 MED ORDER — HYDROCORTISONE NA SUCCINATE PF 100 MG IJ SOLR
100.0000 mg | Freq: Once | INTRAMUSCULAR | Status: AC
  Administered 2021-01-10: 100 mg via INTRAVENOUS
  Filled 2021-01-10: qty 2

## 2021-01-10 NOTE — Progress Notes (Signed)
ANTICOAGULATION CONSULT NOTE -    Pharmacy Consult for warfarin dosing  Indication: atrial fibrillation   Allergies  Allergen Reactions   Adhesive [Tape]    Sulfa Antibiotics     UNKNOWN Childhood reaction      Patient Measurements: Last Weight  Most recent update: 01/08/2021  9:38 AM    Weight  113.4 kg (250 lb)            Body mass index is 33.91 kg/m. Roy [name]               Temp: 98.1 F (36.7 C) (09/06 0512) Temp Source: Oral (09/06 0512) BP: 98/62 (09/06 0512) Pulse Rate: 51 (09/06 0512)  Labs: Recent Labs    01/08/21 0958 01/09/21 0455 01/10/21 0600  HGB 15.8 14.8 16.1  HCT 44.1 41.8 46.8  PLT 177 170 198  LABPROT 35.9* 34.1* 28.2*  INR 3.6* 3.4* 2.6*  CREATININE 0.71 0.66 0.68     Estimated Creatinine Clearance: 111.7 mL/min (by C-G formula based on SCr of 0.68 mg/dL).     Medications:  Medications Prior to Admission  Medication Sig Dispense Refill Last Dose   Calcium Carbonate-Vitamin D (CALCIUM-VITAMIN D) 600-125 MG-UNIT TABS Take by mouth.   01/07/2021   Cholecalciferol (VITAMIN D3) 25 MCG (1000 UT) CAPS Take by mouth daily.   01/07/2021   magnesium oxide (MAG-OX) 400 (240 Mg) MG tablet Take 1 tablet (400 mg total) by mouth 2 (two) times daily. 60 tablet 1 01/07/2021   meclizine (ANTIVERT) 12.5 MG tablet Take 1 tablet (12.5 mg total) by mouth 3 (three) times daily as needed for dizziness. 30 tablet 0 01/07/2021   omeprazole (PRILOSEC) 10 MG capsule Take 1 capsule by mouth daily.   01/07/2021   potassium chloride (KLOR-CON) 10 MEQ tablet Take 10 mEq by mouth 2 (two) times daily.   01/07/2021   rosuvastatin (CRESTOR) 5 MG tablet Take 1 tablet by mouth at bedtime.   01/07/2021   warfarin (COUMADIN) 5 MG tablet Take 1 tablet (5 mg total) by mouth daily at 4 PM. (Patient taking differently: Take 5 mg by mouth every evening.)   01/07/2021 at 1600   amLODipine (NORVASC) 2.5 MG tablet Take 1 tablet (2.5 mg total) by mouth daily. (Patient not taking: No sig  reported) 30 tablet 1    magnesium oxide (MAG-OX) 400 MG tablet Take by mouth. (Patient not taking: No sig reported)      potassium chloride (KLOR-CON) 10 MEQ tablet Take by mouth. (Patient not taking: No sig reported)      Scheduled:   feeding supplement  237 mL Oral BID BM   midodrine  5 mg Oral TID WC   potassium chloride  10 mEq Oral BID   rosuvastatin  5 mg Oral QHS   Warfarin - Pharmacist Dosing Inpatient   Does not apply q1600   Infusions:   sodium chloride Stopped (01/09/21 1452)   PRN:  Anti-infectives (From admission, onward)    None       Goal of Therapy:  INR 2-3 Monitor platelets by anticoagulation protocol: Yes    Prior to Admission Warfarin Dosing:  Roy [name] takes '5mg'$  of warfarin daily     Admit INR was 3.6 Lab Results  Component Value Date   INR 2.6 (H) 01/10/2021   INR 3.4 (H) 01/09/2021   INR 3.6 (H) 01/08/2021    Assessment: Roy [name] a 70 y.o. male requires anticoagulation with warfarin for the indication of  atrial fibrillation.  Warfarin will be initiated inpatient following pharmacy protocol per pharmacy consult. Patient most recent blood work is as follows: CBC Latest Ref Rng & Units 01/10/2021 01/09/2021 01/08/2021  WBC 4.0 - 10.5 K/uL 3.7(L) 2.8(L) 3.6(L)  Hemoglobin 13.0 - 17.0 g/dL 16.1 14.8 15.8  Hematocrit 39.0 - 52.0 % 46.8 41.8 44.1  Platelets 150 - 400 K/uL 198 170 177   INR 2.6 today, therapeutic  Home dose is '5mg'$  daily  Plan:  Warfarin '5mg'$  a 1 dose tonight  Monitor CBC daily with am labs   Monitor INR daily Monitor for signs and symptoms of bleeding   Isac Sarna, BS [place], BCPS Clinical Pharmacist Pager [phone]

## 2021-01-10 NOTE — Progress Notes (Signed)
PROGRESS NOTE    [name]  [id number] DOB: 12/08/1950 DOA: 01/08/2021 PCP: Bridget Hartshorn, NP   Brief Narrative:   Roy [name] is a 70 y.o. WM PMHx afib on chronic anticoagulation, trifascicular block, left anterior fascicular block, RBBB essential HTN, gerd, HLD, obesity,    Presents to the ED today for eval of generalized weakness for the last month. States he has felt sob for the last few weeks as well. Report diarrhea x2 days. Denies ble swelling, cough, chest pain, vomiting, abd pain, bloody stools, fevers. Sxs constant and have worsened since onset. Denies exacerbating or alleviating factors.  States has not been eating for~30 days (just has not felt hungry)   Subjective: 9/6 afebrile overnight patient continues to be bradycardic post atropine, and hypotensive.   Assessment & Plan:  Covid vaccination;  Active Problems:   PAF (paroxysmal atrial fibrillation) (HCC)   RBBB   First degree AV block   Essential hypertension   Trifascicular block   LAFB (left anterior fascicular block)   Obesity (BMI 30-39.9)   Chronic anticoagulation   Hyperthyroidism   Hyponatremia   Weakness   Weakness - May be multifactorial to include bradycardia, hypotension, heart failure?,  Elevated pulmonary arterial pressure -Will hold all BP medication, and any other medication which would lower BP. - Echocardiogram pending -Free T4 pending - 9/6 DC normal saline 159m/hr  Adrenal insufficiency - 9/5 AM cortisol <0.4 extremely low - Hydrocortisone IV 100 mg TID -9/6 ACTH pending -Free T4 WNL - 9/6 consult Endocrinology in the AM.   Symptomatic bradycardia - Atropine 0.4 mg x 1 -Await findings of echocardiogram - [facility] -9/5 orthostatic vitals q shift -9/5 atropine 0.4 mg x 1: Sustained/symptomatic bradycardia  Paroxysmal atrial fibrillation - Currently NSR -Warfarin per pharmacy Lab  Results  Component Value Date   INR 2.6 (H) 01/10/2021   INR 3.4 (H) 01/09/2021   INR 3.6 (H) 01/08/2021   INR 3.6 (H) 01/02/2021   INR 3.8 (H) 10/24/2020   Hypotension - 9/5 AM cortisol: See adrenal insufficiency - 9/5 TSH WNL - 9/5 Midodrine 5 mg TID   Hyponatremia - Most likely multifactorial anorexia, dehydration -Treat underlying cause Lab Results  Component Value Date   NA 129 (L) 01/10/2021   NA 127 (L) 01/09/2021   NA 125 (L) 01/08/2021   NA 131 (L) 01/02/2021   NA 135 11/11/2020    Hypomagnesmia - Magnesium goal> 2 - Magnesium IV 2 g   DVT prophylaxis: Coumadin Code Status: Full Family Communication:  Status is: Inpatient    Dispo: The patient is from: Home              Anticipated d/c is to: Home              Anticipated d/c date is: 3 days              Patient currently is not medically stable to d/c.      Consultants:    Procedures/Significant Events:  9/4 SARS coronavirus negative 9/4 influenza A/B negative   I have personally reviewed and interpreted all radiology studies and my findings are as above.  VENTILATOR SETTINGS:    Cultures   Antimicrobials:    Devices    LINES / TUBES:      Continuous Infusions:  sodium chloride Stopped (01/09/21 1452)     Objective: Vitals:   01/09/21 2103 01/09/21 2123 01/09/21  2134 01/10/21 0512  BP: (!) 102/59 (!) 91/59 106/69 98/62  Pulse: (!) 59 (!) 57 60 (!) 51  Resp:  '18 18 16  '$ Temp:  98 F (36.7 C)  98.1 F (36.7 C)  TempSrc:    Oral  SpO2:  93% 95% 92%  Weight:      Height:        Intake/Output Summary (Last 24 hours) at 01/10/2021 0744 Last data filed at 01/10/2021 0200 Gross per 24 hour  Intake 760.31 ml  Output 300 ml  Net 460.31 ml    Filed Weights   01/08/21 0937  Weight: 113.4 kg    Examination:  General: A/O x4, No acute respiratory distress Eyes: negative scleral hemorrhage, negative anisocoria, negative icterus ENT: Negative Runny nose, negative  gingival bleeding, Neck:  Negative scars, masses, torticollis, lymphadenopathy, JVD Lungs: Clear to auscultation bilaterally without wheezes or crackles Cardiovascular: Regular rate and rhythm without murmur gallop or rub normal S1 and S2 Abdomen: negative abdominal pain, nondistended, positive soft, bowel sounds, no rebound, no ascites, no appreciable mass Extremities: No significant cyanosis, clubbing, or edema bilateral lower extremities Skin: Negative rashes, lesions, ulcers Psychiatric:  Negative depression, negative anxiety, negative fatigue, negative mania  Central nervous system:  Cranial nerves II through XII intact, tongue/uvula midline, all extremities muscle strength 5/5, sensation intact throughout, negative dysarthria, negative expressive aphasia, negative receptive aphasia.  .     Data Reviewed: Care during the described time interval was provided by me .  I have reviewed this patient's available data, including medical history, events of note, physical examination, and all test results as part of my evaluation.   CBC: Recent Labs  Lab 01/08/21 0958 01/09/21 0455 01/10/21 0600  WBC 3.6* 2.8* 3.7*  NEUTROABS  --  1.1* 1.4*  HGB 15.8 14.8 16.1  HCT 44.1 41.8 46.8  MCV 87.8 88.0 90.3  PLT 177 170 [id number]    Basic Metabolic Panel: Recent Labs  Lab 01/08/21 0958 01/09/21 0455 01/10/21 0600  NA 125* 127* 129*  K 3.8 4.0 4.1  CL 89* 90* 91*  CO2 '25 27 26  '$ GLUCOSE 86 78 96  BUN 7* 8 9  CREATININE 0.71 0.66 0.68  CALCIUM 9.4 9.3 9.7  MG  --  1.6* 2.0  PHOS  --  3.2 3.9    GFR: Estimated Creatinine Clearance: 111.7 mL/min (by C-G formula based on SCr of 0.68 mg/dL). Liver Function Tests: Recent Labs  Lab 01/08/21 0958 01/09/21 0455 01/10/21 0600  AST 119* 109* 117*  ALT '15 17 16  '$ ALKPHOS 65 61 65  BILITOT 3.0* 3.0* 2.5*  PROT 6.6 6.2* 6.9  ALBUMIN 3.8 3.6 3.9    No results for input(s): LIPASE, AMYLASE in the last 168 hours. No results for input(s):  AMMONIA in the last 168 hours. Coagulation Profile: Recent Labs  Lab 01/08/21 0958 01/09/21 0455 01/10/21 0600  INR 3.6* 3.4* 2.6*    Cardiac Enzymes: No results for input(s): CKTOTAL, CKMB, CKMBINDEX, TROPONINI in the last 168 hours. BNP (last 3 results) No results for input(s): PROBNP in the last 8760 hours. HbA1C: No results for input(s): HGBA1C in the last 72 hours. CBG: Recent Labs  Lab 01/08/21 1017  GLUCAP 89    Lipid Profile: No results for input(s): CHOL, HDL, LDLCALC, TRIG, CHOLHDL, LDLDIRECT in the last 72 hours. Thyroid Function Tests: Recent Labs    01/08/21 0958 01/09/21 0455  TSH 0.036*  --   FREET4  --  0.74    Anemia  Panel: No results for input(s): VITAMINB12, FOLATE, FERRITIN, TIBC, IRON, RETICCTPCT in the last 72 hours. Urine analysis:    Component Value Date/Time   COLORURINE YELLOW 01/08/2021 [place] 01/08/2021 1032   LABSPEC 1.020 01/08/2021 1032   PHURINE 5.5 01/08/2021 1032   GLUCOSEU NEGATIVE 01/08/2021 1032   HGBUR NEGATIVE 01/08/2021 1032   [place] 01/08/2021 1032   KETONESUR NEGATIVE 01/08/2021 1032   PROTEINUR NEGATIVE 01/08/2021 1032   UROBILINOGEN 1.0 05/15/2010 2059   NITRITE NEGATIVE 01/08/2021 1032   LEUKOCYTESUR NEGATIVE 01/08/2021 1032   Sepsis Labs: '@LABRCNTIP'$ (procalcitonin:4,lacticidven:4)  ) Recent Results (from the past 240 hour(s))  Resp Panel by RT-PCR (Flu A&B, Covid) Nasopharyngeal Swab     Status: None   Collection Time: 01/02/21 11:51 AM   Specimen: Nasopharyngeal Swab; Nasopharyngeal(NP) swabs in vial transport medium  Result Value Ref Range Status   SARS Coronavirus 2 by RT PCR NEGATIVE NEGATIVE Final    Comment: (NOTE) SARS-CoV-2 target nucleic acids are NOT DETECTED.  The SARS-CoV-2 RNA is generally detectable in upper respiratory specimens during the acute phase of infection. The lowest concentration of SARS-CoV-2 viral copies this assay can detect is 138 copies/mL. A  negative result does not preclude SARS-Cov-2 infection and should not be used as the sole basis for treatment or other patient management decisions. A negative result may occur with  improper specimen collection/handling, submission of specimen other than nasopharyngeal swab, presence of viral mutation(s) within the areas targeted by this assay, and inadequate number of viral copies(<138 copies/mL). A negative result must be combined with clinical observations, patient history, and epidemiological information. The expected result is Negative.  Fact Sheet for Patients:  EntrepreneurPulse.com.au  Fact Sheet for Healthcare Providers:  IncredibleEmployment.be  This test is no t yet approved or cleared by the [place] FDA and  has been authorized for detection and/or diagnosis of SARS-CoV-2 by FDA under an Emergency Use Authorization (EUA). This EUA will remain  in effect (meaning this test can be used) for the duration of the COVID-19 declaration under Section 564(b)(1) of the Act, 21 U.S.C.section 360bbb-3(b)(1), unless the authorization is terminated  or revoked sooner.       Influenza A by PCR NEGATIVE NEGATIVE Final   Influenza B by PCR NEGATIVE NEGATIVE Final    Comment: (NOTE) The Xpert Xpress SARS-CoV-2/FLU/RSV plus assay is intended as an aid in the diagnosis of influenza from Nasopharyngeal swab specimens and should not be used as a sole basis for treatment. Nasal washings and aspirates are unacceptable for Xpert Xpress SARS-CoV-2/FLU/RSV testing.  Fact Sheet for Patients: EntrepreneurPulse.com.au  Fact Sheet for Healthcare Providers: IncredibleEmployment.be  This test is not yet approved or cleared by the [place] FDA and has been authorized for detection and/or diagnosis of SARS-CoV-2 by FDA under an Emergency Use Authorization (EUA). This EUA will remain in effect (meaning this test can  be used) for the duration of the COVID-19 declaration under Section 564(b)(1) of the Act, 21 U.S.C. section 360bbb-3(b)(1), unless the authorization is terminated or revoked.  Performed at [facility], [address] [place], [place] [address]   Resp Panel by RT-PCR (Flu A&B, Covid) Nasopharyngeal Swab     Status: None   Collection Time: 01/08/21  9:51 AM   Specimen: Nasopharyngeal Swab; Nasopharyngeal(NP) swabs in vial transport medium  Result Value Ref Range Status   SARS Coronavirus 2 by RT PCR NEGATIVE NEGATIVE Final    Comment: (NOTE) SARS-CoV-2 target nucleic acids are NOT DETECTED.  The  SARS-CoV-2 RNA is generally detectable in upper respiratory specimens during the acute phase of infection. The lowest concentration of SARS-CoV-2 viral copies this assay can detect is 138 copies/mL. A negative result does not preclude SARS-Cov-2 infection and should not be used as the sole basis for treatment or other patient management decisions. A negative result may occur with  improper specimen collection/handling, submission of specimen other than nasopharyngeal swab, presence of viral mutation(s) within the areas targeted by this assay, and inadequate number of viral copies(<138 copies/mL). A negative result must be combined with clinical observations, patient history, and epidemiological information. The expected result is Negative.  Fact Sheet for Patients:  EntrepreneurPulse.com.au  Fact Sheet for Healthcare Providers:  IncredibleEmployment.be  This test is no t yet approved or cleared by the [place] FDA and  has been authorized for detection and/or diagnosis of SARS-CoV-2 by FDA under an Emergency Use Authorization (EUA). This EUA will remain  in effect (meaning this test can be used) for the duration of the COVID-19 declaration under Section 564(b)(1) of the Act, 21 U.S.C.section 360bbb-3(b)(1), unless the authorization is terminated   or revoked sooner.       Influenza A by PCR NEGATIVE NEGATIVE Final   Influenza B by PCR NEGATIVE NEGATIVE Final    Comment: (NOTE) The Xpert Xpress SARS-CoV-2/FLU/RSV plus assay is intended as an aid in the diagnosis of influenza from Nasopharyngeal swab specimens and should not be used as a sole basis for treatment. Nasal washings and aspirates are unacceptable for Xpert Xpress SARS-CoV-2/FLU/RSV testing.  Fact Sheet for Patients: EntrepreneurPulse.com.au  Fact Sheet for Healthcare Providers: IncredibleEmployment.be  This test is not yet approved or cleared by the [place] FDA and has been authorized for detection and/or diagnosis of SARS-CoV-2 by FDA under an Emergency Use Authorization (EUA). This EUA will remain in effect (meaning this test can be used) for the duration of the COVID-19 declaration under Section 564(b)(1) of the Act, 21 U.S.C. section 360bbb-3(b)(1), unless the authorization is terminated or revoked.  Performed at [facility], [address] [place], [place] [address]   Culture, blood (routine x 2)     Status: None (Preliminary result)   Collection Time: 01/08/21  1:13 PM   Specimen: BLOOD LEFT HAND  Result Value Ref Range Status   Specimen Description   Final    BLOOD LEFT HAND BOTTLES DRAWN AEROBIC AND ANAEROBIC   Special Requests PENDING  Incomplete   Culture   Final    NO GROWTH < 24 HOURS Performed at [facility], [address] [place], [place] [address]    Report Status PENDING  Incomplete  Culture, blood (routine x 2)     Status: None (Preliminary result)   Collection Time: 01/08/21  1:13 PM   Specimen: Left Antecubital; Blood  Result Value Ref Range Status   Specimen Description   Final    LEFT ANTECUBITAL BOTTLES DRAWN AEROBIC AND ANAEROBIC   Special Requests Blood Culture adequate volume  Final   Culture   Final    NO GROWTH < 24 HOURS Performed at [facility], [address],  [place], [place] [address]    Report Status PENDING  Incomplete         Radiology Studies: DG Cervical Spine 2 or 3 views  Result Date: 01/09/2021 CLINICAL DATA:  Neck pain EXAM: CERVICAL SPINE - 2-3 VIEW COMPARISON:  None. FINDINGS: Suboptimal visualization of C6 and below. Dens and lateral masses are within normal limits. Imaged vertebral body heights appear  normal. Diffuse ankylosis. Normal prevertebral soft tissue thickness. IMPRESSION: Limited visibility C6 and below. Diffuse ankylosis without definite acute osseous abnormality within the visualized portions of the cervical spine Electronically Signed   By: Donavan Foil M.D.   On: 01/09/2021 19:05   DG Chest Portable 1 View  Result Date: 01/08/2021 CLINICAL DATA:  Dyspnea, weakness, diarrhea EXAM: PORTABLE CHEST 1 VIEW COMPARISON:  03/26/2018 chest radiograph. FINDINGS: Stable cardiomediastinal silhouette with top-normal heart size. No pneumothorax. No pleural effusion. Lungs appear clear, with no acute consolidative airspace disease and no pulmonary edema. IMPRESSION: No active disease. Electronically Signed   By: Ilona Sorrel M.D.   On: 01/08/2021 10:17        Scheduled Meds:  feeding supplement  237 mL Oral BID BM   midodrine  5 mg Oral TID WC   potassium chloride  10 mEq Oral BID   rosuvastatin  5 mg Oral QHS   Warfarin - Pharmacist Dosing Inpatient   Does not apply q1600   Continuous Infusions:  sodium chloride Stopped (01/09/21 1452)     LOS: 0 days   The patient is critically ill with multiple organ systems failure and requires high complexity decision making for assessment and support, frequent evaluation and titration of therapies, application of advanced monitoring technologies and extensive interpretation of multiple databases. Critical Care Time devoted to patient care services described in this note  Time spent: 40 minutes     Catherine Oak, Geraldo Docker, MD [facility]   If 7PM-7AM, please contact  night-coverage 01/10/2021, 7:44 AM

## 2021-01-10 NOTE — Plan of Care (Signed)
  Problem: Education: Goal: Knowledge of General Education information will improve Description: Including pain rating scale, medication(s)/side effects and non-pharmacologic comfort measures Outcome: Progressing   Problem: Health Behavior/Discharge Planning: Goal: Ability to manage health-related needs will improve Outcome: Progressing   Problem: Clinical Measurements: Goal: Ability to maintain clinical measurements within normal limits will improve Outcome: Progressing Goal: Will remain free from infection Outcome: Progressing Goal: Diagnostic test results will improve Outcome: Progressing Goal: Respiratory complications will improve Outcome: Progressing Goal: Cardiovascular complication will be avoided Outcome: Progressing   Problem: Activity: Goal: Risk for activity intolerance will decrease Outcome: Progressing   Problem: Elimination: Goal: Will not experience complications related to bowel motility Outcome: Progressing   Problem: Safety: Goal: Ability to remain free from injury will improve Outcome: Progressing   Problem: Skin Integrity: Goal: Risk for impaired skin integrity will decrease Outcome: Progressing

## 2021-01-10 NOTE — Progress Notes (Signed)
Patient is alert and pleasant. Tolerated meds and meals well. States he feels better overall. IV fluids d/c'd per order. No pain or nausea.

## 2021-01-10 NOTE — Plan of Care (Signed)
  Problem: Education: Goal: Knowledge of General Education information will improve Description: Including pain rating scale, medication(s)/side effects and non-pharmacologic comfort measures 01/10/2021 1850 by Deberah Castle, RN Outcome: Progressing 01/10/2021 1849 by Deberah Castle, RN Outcome: Progressing   Problem: Health Behavior/Discharge Planning: Goal: Ability to manage health-related needs will improve 01/10/2021 1850 by Deberah Castle, RN Outcome: Progressing 01/10/2021 1849 by Deberah Castle, RN Outcome: Progressing   Problem: Clinical Measurements: Goal: Ability to maintain clinical measurements within normal limits will improve 01/10/2021 1850 by Deberah Castle, RN Outcome: Progressing 01/10/2021 1849 by Deberah Castle, RN Outcome: Progressing Goal: Will remain free from infection 01/10/2021 1850 by Deberah Castle, RN Outcome: Progressing 01/10/2021 1849 by Deberah Castle, RN Outcome: Progressing Goal: Diagnostic test results will improve 01/10/2021 1850 by Deberah Castle, RN Outcome: Progressing 01/10/2021 1849 by Deberah Castle, RN Outcome: Progressing Goal: Respiratory complications will improve 01/10/2021 1850 by Deberah Castle, RN Outcome: Progressing 01/10/2021 1849 by Deberah Castle, RN Outcome: Progressing Goal: Cardiovascular complication will be avoided 01/10/2021 1850 by Deberah Castle, RN Outcome: Progressing 01/10/2021 1849 by Deberah Castle, RN Outcome: Progressing   Problem: Activity: Goal: Risk for activity intolerance will decrease 01/10/2021 1850 by Deberah Castle, RN Outcome: Progressing 01/10/2021 1849 by Deberah Castle, RN Outcome: Progressing   Problem: Elimination: Goal: Will not experience complications related to bowel motility 01/10/2021 1850 by Deberah Castle, RN Outcome: Progressing 01/10/2021 1849 by Deberah Castle, RN Outcome: Progressing   Problem: Safety: Goal: Ability to remain  free from injury will improve 01/10/2021 1850 by Deberah Castle, RN Outcome: Progressing 01/10/2021 1849 by Deberah Castle, RN Outcome: Progressing   Problem: Skin Integrity: Goal: Risk for impaired skin integrity will decrease 01/10/2021 1850 by Deberah Castle, RN Outcome: Progressing 01/10/2021 1849 by Deberah Castle, RN Outcome: Progressing

## 2021-01-10 NOTE — Progress Notes (Signed)
Patient heart rate has been in the 50s, upper 40s during this shift.  Patient has had no cardiac type  complaints this shift.

## 2021-01-11 DIAGNOSIS — E2740 Unspecified adrenocortical insufficiency: Secondary | ICD-10-CM | POA: Diagnosis present

## 2021-01-11 DIAGNOSIS — Z79899 Other long term (current) drug therapy: Secondary | ICD-10-CM | POA: Diagnosis not present

## 2021-01-11 DIAGNOSIS — I453 Trifascicular block: Secondary | ICD-10-CM | POA: Diagnosis present

## 2021-01-11 DIAGNOSIS — K219 Gastro-esophageal reflux disease without esophagitis: Secondary | ICD-10-CM | POA: Diagnosis present

## 2021-01-11 DIAGNOSIS — R001 Bradycardia, unspecified: Secondary | ICD-10-CM | POA: Diagnosis present

## 2021-01-11 DIAGNOSIS — I959 Hypotension, unspecified: Secondary | ICD-10-CM | POA: Diagnosis present

## 2021-01-11 DIAGNOSIS — I480 Paroxysmal atrial fibrillation: Secondary | ICD-10-CM | POA: Diagnosis present

## 2021-01-11 DIAGNOSIS — Z7901 Long term (current) use of anticoagulants: Secondary | ICD-10-CM | POA: Diagnosis not present

## 2021-01-11 DIAGNOSIS — E871 Hypo-osmolality and hyponatremia: Secondary | ICD-10-CM | POA: Diagnosis present

## 2021-01-11 DIAGNOSIS — Z8349 Family history of other endocrine, nutritional and metabolic diseases: Secondary | ICD-10-CM | POA: Diagnosis not present

## 2021-01-11 DIAGNOSIS — I440 Atrioventricular block, first degree: Secondary | ICD-10-CM | POA: Diagnosis present

## 2021-01-11 DIAGNOSIS — R0600 Dyspnea, unspecified: Secondary | ICD-10-CM | POA: Diagnosis not present

## 2021-01-11 DIAGNOSIS — Z8249 Family history of ischemic heart disease and other diseases of the circulatory system: Secondary | ICD-10-CM | POA: Diagnosis not present

## 2021-01-11 DIAGNOSIS — Z96642 Presence of left artificial hip joint: Secondary | ICD-10-CM | POA: Diagnosis present

## 2021-01-11 DIAGNOSIS — Z91048 Other nonmedicinal substance allergy status: Secondary | ICD-10-CM | POA: Diagnosis not present

## 2021-01-11 DIAGNOSIS — E7800 Pure hypercholesterolemia, unspecified: Secondary | ICD-10-CM | POA: Diagnosis present

## 2021-01-11 DIAGNOSIS — Z6833 Body mass index (BMI) 33.0-33.9, adult: Secondary | ICD-10-CM | POA: Diagnosis not present

## 2021-01-11 DIAGNOSIS — I10 Essential (primary) hypertension: Secondary | ICD-10-CM | POA: Diagnosis present

## 2021-01-11 DIAGNOSIS — E669 Obesity, unspecified: Secondary | ICD-10-CM | POA: Diagnosis present

## 2021-01-11 DIAGNOSIS — Z882 Allergy status to sulfonamides status: Secondary | ICD-10-CM | POA: Diagnosis not present

## 2021-01-11 DIAGNOSIS — R531 Weakness: Secondary | ICD-10-CM | POA: Diagnosis present

## 2021-01-11 DIAGNOSIS — Z20822 Contact with and (suspected) exposure to covid-19: Secondary | ICD-10-CM | POA: Diagnosis present

## 2021-01-11 DIAGNOSIS — E0590 Thyrotoxicosis, unspecified without thyrotoxic crisis or storm: Secondary | ICD-10-CM | POA: Diagnosis present

## 2021-01-11 LAB — PROTIME-INR
INR: 2.1 — ABNORMAL HIGH (ref 0.8–1.2)
Prothrombin Time: 23.9 seconds — ABNORMAL HIGH (ref 11.4–15.2)

## 2021-01-11 LAB — COMPREHENSIVE METABOLIC PANEL
ALT: 18 U/L (ref 0–44)
AST: 123 U/L — ABNORMAL HIGH (ref 15–41)
Albumin: 4.2 g/dL (ref 3.5–5.0)
Alkaline Phosphatase: 61 U/L (ref 38–126)
Anion gap: 12 (ref 5–15)
BUN: 16 mg/dL (ref 8–23)
CO2: 21 mmol/L — ABNORMAL LOW (ref 22–32)
Calcium: 9.7 mg/dL (ref 8.9–10.3)
Chloride: 99 mmol/L (ref 98–111)
Creatinine, Ser: 0.7 mg/dL (ref 0.61–1.24)
GFR, Estimated: >60 mL/min (ref >60)
Glucose, Bld: 140 mg/dL — ABNORMAL HIGH (ref 70–99)
Potassium: 4.5 mmol/L (ref 3.5–5.1)
Sodium: 132 mmol/L — ABNORMAL LOW (ref 135–145)
Total Bilirubin: 2.3 mg/dL — ABNORMAL HIGH (ref 0.3–1.2)
Total Protein: 7.2 g/dL (ref 6.5–8.1)

## 2021-01-11 LAB — URINE CULTURE: Culture: NO GROWTH

## 2021-01-11 LAB — CBC WITH DIFFERENTIAL/PLATELET
Abs Immature Granulocytes: 0.05 10*3/uL (ref 0.00–0.07)
Basophils Absolute: 0 10*3/uL (ref 0.0–0.1)
Basophils Relative: 0 %
Eosinophils Absolute: 0 10*3/uL (ref 0.0–0.5)
Eosinophils Relative: 0 %
HCT: 42.3 % (ref 39.0–52.0)
Hemoglobin: 14.6 g/dL (ref 13.0–17.0)
Immature Granulocytes: 1 %
Lymphocytes Relative: 9 %
Lymphs Abs: 0.7 10*3/uL (ref 0.7–4.0)
MCH: 31.6 pg (ref 26.0–34.0)
MCHC: 34.5 g/dL (ref 30.0–36.0)
MCV: 91.6 fL (ref 80.0–100.0)
Monocytes Absolute: 0.3 10*3/uL (ref 0.1–1.0)
Monocytes Relative: 4 %
Neutro Abs: 6.2 10*3/uL (ref 1.7–7.7)
Neutrophils Relative %: 86 %
Platelets: 192 10*3/uL (ref 150–400)
RBC: 4.62 MIL/uL (ref 4.22–5.81)
RDW: 14.8 % (ref 11.5–15.5)
WBC: 7.2 10*3/uL (ref 4.0–10.5)
nRBC: 0 % (ref 0.0–0.2)

## 2021-01-11 LAB — ACTH: C206 ACTH: 6.3 pg/mL — ABNORMAL LOW (ref 7.2–63.3)

## 2021-01-11 LAB — MAGNESIUM: Magnesium: 2 mg/dL (ref 1.7–2.4)

## 2021-01-11 LAB — PHOSPHORUS: Phosphorus: 2.5 mg/dL (ref 2.5–4.6)

## 2021-01-11 MED ORDER — COSYNTROPIN 0.25 MG IJ SOLR
0.2500 mg | Freq: Once | INTRAMUSCULAR | Status: AC
  Administered 2021-01-12: 0.25 mg via INTRAVENOUS
  Filled 2021-01-11 (×2): qty 0.25

## 2021-01-11 MED ORDER — WARFARIN SODIUM 5 MG PO TABS
5.0000 mg | ORAL_TABLET | Freq: Once | ORAL | Status: AC
  Administered 2021-01-11: 5 mg via ORAL
  Filled 2021-01-11: qty 1

## 2021-01-11 MED ORDER — ALUM & MAG HYDROXIDE-SIMETH 200-200-20 MG/5ML PO SUSP
30.0000 mL | ORAL | Status: DC | PRN
  Administered 2021-01-11 – 2021-01-13 (×3): 30 mL via ORAL
  Filled 2021-01-11 (×3): qty 30

## 2021-01-11 NOTE — Progress Notes (Signed)
ANTICOAGULATION CONSULT NOTE -    Pharmacy Consult for warfarin dosing  Indication: atrial fibrillation   Allergies  Allergen Reactions   Adhesive [Tape]    Sulfa Antibiotics     UNKNOWN Childhood reaction      Patient Measurements: Last Weight  Most recent update: 01/08/2021  9:38 AM    Weight  113.4 kg (250 lb)            Body mass index is 33.91 kg/m. Roy [name]               Temp: 98.2 F (36.8 C) (09/07 0408) Temp Source: Oral (09/07 0408) BP: 122/83 (09/07 0408) Pulse Rate: 72 (09/07 0408)  Labs: Recent Labs    01/09/21 0455 01/10/21 0600 01/11/21 0534  HGB 14.8 16.1 14.6  HCT 41.8 46.8 42.3  PLT 170 198 192  LABPROT 34.1* 28.2* 23.9*  INR 3.4* 2.6* 2.1*  CREATININE 0.66 0.68 0.70     Estimated Creatinine Clearance: 111.7 mL/min (by C-G formula based on SCr of 0.7 mg/dL).     Medications:  Medications Prior to Admission  Medication Sig Dispense Refill Last Dose   Calcium Carbonate-Vitamin D (CALCIUM-VITAMIN D) 600-125 MG-UNIT TABS Take by mouth.   01/07/2021   Cholecalciferol (VITAMIN D3) 25 MCG (1000 UT) CAPS Take by mouth daily.   01/07/2021   magnesium oxide (MAG-OX) 400 (240 Mg) MG tablet Take 1 tablet (400 mg total) by mouth 2 (two) times daily. 60 tablet 1 01/07/2021   meclizine (ANTIVERT) 12.5 MG tablet Take 1 tablet (12.5 mg total) by mouth 3 (three) times daily as needed for dizziness. 30 tablet 0 01/07/2021   omeprazole (PRILOSEC) 10 MG capsule Take 1 capsule by mouth daily.   01/07/2021   potassium chloride (KLOR-CON) 10 MEQ tablet Take 10 mEq by mouth 2 (two) times daily.   01/07/2021   rosuvastatin (CRESTOR) 5 MG tablet Take 1 tablet by mouth at bedtime.   01/07/2021   warfarin (COUMADIN) 5 MG tablet Take 1 tablet (5 mg total) by mouth daily at 4 PM. (Patient taking differently: Take 5 mg by mouth every evening.)   01/07/2021 at 1600   amLODipine (NORVASC) 2.5 MG tablet Take 1 tablet (2.5 mg total) by mouth daily. (Patient not taking: No sig  reported) 30 tablet 1    magnesium oxide (MAG-OX) 400 MG tablet Take by mouth. (Patient not taking: No sig reported)      potassium chloride (KLOR-CON) 10 MEQ tablet Take by mouth. (Patient not taking: No sig reported)      Scheduled:   feeding supplement  237 mL Oral BID BM   hydrocortisone sod succinate (SOLU-CORTEF) inj  100 mg Intravenous Q8H   midodrine  5 mg Oral TID WC   potassium chloride  10 mEq Oral BID   rosuvastatin  5 mg Oral QHS   Warfarin - Pharmacist Dosing Inpatient   Does not apply q1600   Infusions:   PRN:  Anti-infectives (From admission, onward)    None       Goal of Therapy:  INR 2-3 Monitor platelets by anticoagulation protocol: Yes    Prior to Admission Warfarin Dosing:  Roy [name] takes '5mg'$  of warfarin daily     Admit INR was 3.6 Lab Results  Component Value Date   INR 2.1 (H) 01/11/2021   INR 2.6 (H) 01/10/2021   INR 3.4 (H) 01/09/2021    Assessment: Roy [name] a 70 y.o. male requires anticoagulation with warfarin for the  indication of  atrial fibrillation. Warfarin will be initiated inpatient following pharmacy protocol per pharmacy consult. Patient most recent blood work is as follows: CBC Latest Ref Rng & Units 01/11/2021 01/10/2021 01/09/2021  WBC 4.0 - 10.5 K/uL 7.2 3.7(L) 2.8(L)  Hemoglobin 13.0 - 17.0 g/dL 14.6 16.1 14.8  Hematocrit 39.0 - 52.0 % 42.3 46.8 41.8  Platelets 150 - 400 K/uL 192 198 170   INR 2.1 today, therapeutic  Home dose is '5mg'$  daily  Plan:  Warfarin '5mg'$  x 1 dose tonight  Monitor CBC daily with am labs   Monitor INR daily Monitor for signs and symptoms of bleeding   Isac Sarna, BS [place], BCPS Clinical Pharmacist Pager [phone]

## 2021-01-11 NOTE — Progress Notes (Signed)
Nutrition Brief Note  Patient identified on the Malnutrition Screening Tool (MST) Report  He presented to ED with weakness and loss of appetite. Hx of obesity, HTN, GERD, HLD. Chart reviewed.   Wt Readings from Last 15 Encounters:  01/08/21 113.4 kg  12/12/20 119.3 kg  11/28/20 119.5 kg  11/11/20 119.8 kg  11/09/20 119.7 kg  10/21/20 122 kg  04/29/19 112.5 kg  03/16/18 116.1 kg  02/26/18 113.4 kg  05/04/15 127.9 kg  04/27/15 125.6 kg  08/17/11 113.4 kg   Acute weight loss noted above 6 kg (5%) x 1 months which is significant.  Body mass index is 33.91 kg/m. Patient meets criteria for obesity based on current BMI.   Current diet order is Heart Healthy, patient is consuming approximately 100% of all meals at this time. Feeds himself. No known skin breakdown or chewing/swallow problems.   Labs and medications reviewed.  BMP Latest Ref Rng & Units 01/11/2021 01/10/2021 01/09/2021  Glucose 70 - 99 mg/dL 140(H) 96 78  BUN 8 - 23 mg/dL '16 9 8  '$ Creatinine 0.61 - 1.24 mg/dL 0.70 0.68 0.66  Sodium 135 - 145 mmol/L 132(L) 129(L) 127(L)  Potassium 3.5 - 5.1 mmol/L 4.5 4.1 4.0  Chloride 98 - 111 mmol/L 99 91(L) 90(L)  CO2 22 - 32 mmol/L 21(L) 26 27  Calcium 8.9 - 10.3 mg/dL 9.7 9.7 9.3     No nutrition interventions warranted at this time. If nutrition issues arise, please consult RD.   Colman Cater MS,RD,CSG,LDN Contact: Shea Evans

## 2021-01-11 NOTE — Progress Notes (Signed)
PROGRESS NOTE    OSAMAH KESTEN  [id number] DOB: April 04, 1951 DOA: 01/08/2021 PCP: Bridget Hartshorn, NP   Brief Narrative:  70 y.o. WM PMHx afib on chronic anticoagulation, trifascicular block, left anterior fascicular block, RBBB essential HTN, gerd, HLD, obesity. Presents to the ED today for eval of generalized weakness for the last month.  Patient was found to have low a.m. cortisol concerning for adrenal insufficiency, initiated hydrocortisone.   Assessment & Plan:   Active Problems:   PAF (paroxysmal atrial fibrillation) (HCC)   RBBB   First degree AV block   Essential hypertension   Trifascicular block   LAFB (left anterior fascicular block)   Obesity (BMI 30-39.9)   Chronic anticoagulation   Hyperthyroidism   Hyponatremia   Weakness   Adrenal insufficiency (HCC)   Generalized weakness Intermittent hypotension Adrenal insufficiency -A.m. cortisol less than 0.4.  Hydrocortisone 100 mg 3 times daily, which I will stop in anticipation for Stim test tomorrow morning.  -ACTH- - Echocardiogram 12/20/2020 '@[facility]'$ -EF 55 to 60%, mild AR, mild AAS - TSH, free T4-normal -Midodrine 5 mg 3 times daily   Symptomatic bradycardia -Received atropine during hospitalization.  Recent echocardiogram as above   Paroxysmal atrial fibrillation -Currently normal sinus rhythm.  Warfarin dosing per pharmacy      Hyponatremia -Improved with fluids     DVT prophylaxis:  warfarin (COUMADIN) tablet 5 mg  Code Status: Full Code Family Communication:  None  Status is: Inpatient. On IV hydrocortisone which is currently on hold to perform Stim Test.   Dispo: The patient is from: Home              Anticipated d/c is to: Home              Patient currently is not medically stable to d/c. Plan for stim test tomorrow morning.  In the meantime watch him in the hospital for bradycardia, hypotension.   Difficult to place patient No       Subjective: Feels okay overall feels  slightly weak.  Review of Systems Otherwise negative except as per HPI, including: General: Denies fever, chills, night sweats or unintended weight loss. Resp: Denies cough, wheezing, shortness of breath. Cardiac: Denies chest pain, palpitations, orthopnea, paroxysmal nocturnal dyspnea. GI: Denies abdominal pain, nausea, vomiting, diarrhea or constipation GU: Denies dysuria, frequency, hesitancy or incontinence MS: Denies muscle aches, joint pain or swelling Neuro: Denies headache, neurologic deficits (focal weakness, numbness, tingling), abnormal gait Psych: Denies anxiety, depression, SI/HI/AVH Skin: Denies new rashes or lesions ID: Denies sick contacts, exotic exposures, travel  Examination:  General exam: Appears calm and comfortable  Respiratory system: Clear to auscultation. Respiratory effort normal. Cardiovascular system: S1 & S2 heard, RRR. No JVD, murmurs, rubs, gallops or clicks. No pedal edema. Gastrointestinal system: Abdomen is nondistended, soft and nontender. No organomegaly or masses felt. Normal bowel sounds heard. Central nervous system: Alert and oriented. No focal neurological deficits. Extremities: Symmetric 5 x 5 power. Skin: No rashes, lesions or ulcers Psychiatry: Judgement and insight appear normal. Mood & affect appropriate.     Objective: Vitals:   01/10/21 0512 01/10/21 1416 01/10/21 2037 01/11/21 0408  BP: 98/62 101/62 135/76 122/83  Pulse: (!) 51 (!) 56 65 72  Resp: '16 18 15 18  '$ Temp: 98.1 F (36.7 C) 98.1 F (36.7 C) 98.3 F (36.8 C) 98.2 F (36.8 C)  TempSrc: Oral Oral Oral Oral  SpO2: 92% 94% 96% 93%  Weight:      Height:  Intake/Output Summary (Last 24 hours) at 01/11/2021 0907 Last data filed at 01/11/2021 0151 Gross per 24 hour  Intake 1072 ml  Output 2225 ml  Net -1153 ml   Filed Weights   01/08/21 0937  Weight: 113.4 kg     Data Reviewed:   CBC: Recent Labs  Lab 01/08/21 0958 01/09/21 0455 01/10/21 0600  01/11/21 0534  WBC 3.6* 2.8* 3.7* 7.2  NEUTROABS  --  1.1* 1.4* 6.2  HGB 15.8 14.8 16.1 14.6  HCT 44.1 41.8 46.8 42.3  MCV 87.8 88.0 90.3 91.6  PLT 177 170 198 [id number]   Basic Metabolic Panel: Recent Labs  Lab 01/08/21 0958 01/09/21 0455 01/10/21 0600 01/11/21 0534  NA 125* 127* 129* 132*  K 3.8 4.0 4.1 4.5  CL 89* 90* 91* 99  CO2 '25 27 26 '$ 21*  GLUCOSE 86 78 96 140*  BUN 7* '8 9 16  '$ CREATININE 0.71 0.66 0.68 0.70  CALCIUM 9.4 9.3 9.7 9.7  MG  --  1.6* 2.0 2.0  PHOS  --  3.2 3.9 2.5   GFR: Estimated Creatinine Clearance: 111.7 mL/min (by C-G formula based on SCr of 0.7 mg/dL). Liver Function Tests: Recent Labs  Lab 01/08/21 0958 01/09/21 0455 01/10/21 0600 01/11/21 0534  AST 119* 109* 117* 123*  ALT '15 17 16 18  '$ ALKPHOS 65 61 65 61  BILITOT 3.0* 3.0* 2.5* 2.3*  PROT 6.6 6.2* 6.9 7.2  ALBUMIN 3.8 3.6 3.9 4.2   No results for input(s): LIPASE, AMYLASE in the last 168 hours. No results for input(s): AMMONIA in the last 168 hours. Coagulation Profile: Recent Labs  Lab 01/08/21 0958 01/09/21 0455 01/10/21 0600 01/11/21 0534  INR 3.6* 3.4* 2.6* 2.1*   Cardiac Enzymes: No results for input(s): CKTOTAL, CKMB, CKMBINDEX, TROPONINI in the last 168 hours. BNP (last 3 results) No results for input(s): PROBNP in the last 8760 hours. HbA1C: Recent Labs    01/08/21 0958  HGBA1C 5.1   CBG: Recent Labs  Lab 01/08/21 1017  GLUCAP 89   Lipid Profile: No results for input(s): CHOL, HDL, LDLCALC, TRIG, CHOLHDL, LDLDIRECT in the last 72 hours. Thyroid Function Tests: Recent Labs    01/08/21 0958 01/09/21 0455  TSH 0.036*  --   FREET4  --  0.74   Anemia Panel: No results for input(s): VITAMINB12, FOLATE, FERRITIN, TIBC, IRON, RETICCTPCT in the last 72 hours. Sepsis Labs: No results for input(s): PROCALCITON, LATICACIDVEN in the last 168 hours.  Recent Results (from the past 240 hour(s))  Resp Panel by RT-PCR (Flu A&B, Covid) Nasopharyngeal Swab     Status:  None   Collection Time: 01/02/21 11:51 AM   Specimen: Nasopharyngeal Swab; Nasopharyngeal(NP) swabs in vial transport medium  Result Value Ref Range Status   SARS Coronavirus 2 by RT PCR NEGATIVE NEGATIVE Final    Comment: (NOTE) SARS-CoV-2 target nucleic acids are NOT DETECTED.  The SARS-CoV-2 RNA is generally detectable in upper respiratory specimens during the acute phase of infection. The lowest concentration of SARS-CoV-2 viral copies this assay can detect is 138 copies/mL. A negative result does not preclude SARS-Cov-2 infection and should not be used as the sole basis for treatment or other patient management decisions. A negative result may occur with  improper specimen collection/handling, submission of specimen other than nasopharyngeal swab, presence of viral mutation(s) within the areas targeted by this assay, and inadequate number of viral copies(<138 copies/mL). A negative result must be combined with clinical observations, patient history, and epidemiological information. The  expected result is Negative.  Fact Sheet for Patients:  EntrepreneurPulse.com.au  Fact Sheet for Healthcare Providers:  IncredibleEmployment.be  This test is no t yet approved or cleared by the [place] FDA and  has been authorized for detection and/or diagnosis of SARS-CoV-2 by FDA under an Emergency Use Authorization (EUA). This EUA will remain  in effect (meaning this test can be used) for the duration of the COVID-19 declaration under Section 564(b)(1) of the Act, 21 U.S.C.section 360bbb-3(b)(1), unless the authorization is terminated  or revoked sooner.       Influenza A by PCR NEGATIVE NEGATIVE Final   Influenza B by PCR NEGATIVE NEGATIVE Final    Comment: (NOTE) The Xpert Xpress SARS-CoV-2/FLU/RSV plus assay is intended as an aid in the diagnosis of influenza from Nasopharyngeal swab specimens and should not be used as a sole basis for  treatment. Nasal washings and aspirates are unacceptable for Xpert Xpress SARS-CoV-2/FLU/RSV testing.  Fact Sheet for Patients: EntrepreneurPulse.com.au  Fact Sheet for Healthcare Providers: IncredibleEmployment.be  This test is not yet approved or cleared by the [place] FDA and has been authorized for detection and/or diagnosis of SARS-CoV-2 by FDA under an Emergency Use Authorization (EUA). This EUA will remain in effect (meaning this test can be used) for the duration of the COVID-19 declaration under Section 564(b)(1) of the Act, 21 U.S.C. section 360bbb-3(b)(1), unless the authorization is terminated or revoked.  Performed at [facility], [address] [place], [place] [address]   Resp Panel by RT-PCR (Flu A&B, Covid) Nasopharyngeal Swab     Status: None   Collection Time: 01/08/21  9:51 AM   Specimen: Nasopharyngeal Swab; Nasopharyngeal(NP) swabs in vial transport medium  Result Value Ref Range Status   SARS Coronavirus 2 by RT PCR NEGATIVE NEGATIVE Final    Comment: (NOTE) SARS-CoV-2 target nucleic acids are NOT DETECTED.  The SARS-CoV-2 RNA is generally detectable in upper respiratory specimens during the acute phase of infection. The lowest concentration of SARS-CoV-2 viral copies this assay can detect is 138 copies/mL. A negative result does not preclude SARS-Cov-2 infection and should not be used as the sole basis for treatment or other patient management decisions. A negative result may occur with  improper specimen collection/handling, submission of specimen other than nasopharyngeal swab, presence of viral mutation(s) within the areas targeted by this assay, and inadequate number of viral copies(<138 copies/mL). A negative result must be combined with clinical observations, patient history, and epidemiological information. The expected result is Negative.  Fact Sheet for Patients:   EntrepreneurPulse.com.au  Fact Sheet for Healthcare Providers:  IncredibleEmployment.be  This test is no t yet approved or cleared by the [place] FDA and  has been authorized for detection and/or diagnosis of SARS-CoV-2 by FDA under an Emergency Use Authorization (EUA). This EUA will remain  in effect (meaning this test can be used) for the duration of the COVID-19 declaration under Section 564(b)(1) of the Act, 21 U.S.C.section 360bbb-3(b)(1), unless the authorization is terminated  or revoked sooner.       Influenza A by PCR NEGATIVE NEGATIVE Final   Influenza B by PCR NEGATIVE NEGATIVE Final    Comment: (NOTE) The Xpert Xpress SARS-CoV-2/FLU/RSV plus assay is intended as an aid in the diagnosis of influenza from Nasopharyngeal swab specimens and should not be used as a sole basis for treatment. Nasal washings and aspirates are unacceptable for Xpert Xpress SARS-CoV-2/FLU/RSV testing.  Fact Sheet for Patients: EntrepreneurPulse.com.au  Fact Sheet for Healthcare Providers: IncredibleEmployment.be  This  test is not yet approved or cleared by the [place] and has been authorized for detection and/or diagnosis of SARS-CoV-2 by FDA under an Emergency Use Authorization (EUA). This EUA will remain in effect (meaning this test can be used) for the duration of the COVID-19 declaration under Section 564(b)(1) of the Act, 21 U.S.C. section 360bbb-3(b)(1), unless the authorization is terminated or revoked.  Performed at [facility], [address] [place], [place] [address]   Urine Culture     Status: None   Collection Time: 01/08/21 10:32 AM   Specimen: Urine, Clean Catch  Result Value Ref Range Status   Specimen Description   Final    URINE, CLEAN CATCH Performed at [facility], [address] [place], [place] [address]    Special Requests   Final    NONE Performed at [facility], [address], [place], [place] [address]    Culture   Final    NO GROWTH Performed at [facility], [place] [address], [place], [place] [address]    Report Status 01/11/2021 FINAL  Final  Culture, blood (routine x 2)     Status: None (Preliminary result)   Collection Time: 01/08/21  1:13 PM   Specimen: BLOOD LEFT HAND  Result Value Ref Range Status   Specimen Description   Final    BLOOD LEFT HAND BOTTLES DRAWN AEROBIC AND ANAEROBIC   Special Requests PENDING  Incomplete   Culture   Final    NO GROWTH 3 DAYS Performed at [facility], [address], [place], [place] [address]    Report Status PENDING  Incomplete  Culture, blood (routine x 2)     Status: None (Preliminary result)   Collection Time: 01/08/21  1:13 PM   Specimen: Left Antecubital; Blood  Result Value Ref Range Status   Specimen Description   Final    LEFT ANTECUBITAL BOTTLES DRAWN AEROBIC AND ANAEROBIC   Special Requests Blood Culture adequate volume  Final   Culture   Final    NO GROWTH 3 DAYS Performed at [facility], [address] [place], [place] [address]    Report Status PENDING  Incomplete         Radiology Studies: DG Cervical Spine 2 or 3 views  Result Date: 01/09/2021 CLINICAL DATA:  Neck pain EXAM: CERVICAL SPINE - 2-3 VIEW COMPARISON:  None. FINDINGS: Suboptimal visualization of C6 and below. Dens and lateral masses are within normal limits. Imaged vertebral body heights appear normal. Diffuse ankylosis. Normal prevertebral soft tissue thickness. IMPRESSION: Limited visibility C6 and below. Diffuse ankylosis without definite acute osseous abnormality within the visualized portions of the cervical spine Electronically Signed   By: Donavan Foil M.D.   On: 01/09/2021 19:05        Scheduled Meds:  feeding supplement  237 mL Oral BID BM   hydrocortisone sod succinate (SOLU-CORTEF) inj  100 mg Intravenous Q8H   midodrine  5 mg Oral TID WC   potassium chloride  10 mEq Oral BID    rosuvastatin  5 mg Oral QHS   Warfarin - Pharmacist Dosing Inpatient   Does not apply q1600   Continuous Infusions:   LOS: 0 days   Time spent= 35 mins    Adra Shepler Arsenio Loader, MD [facility]  If 7PM-7AM, please contact night-coverage  01/11/2021, 9:07 AM

## 2021-01-12 DIAGNOSIS — E2740 Unspecified adrenocortical insufficiency: Secondary | ICD-10-CM | POA: Diagnosis not present

## 2021-01-12 DIAGNOSIS — I10 Essential (primary) hypertension: Secondary | ICD-10-CM | POA: Diagnosis not present

## 2021-01-12 DIAGNOSIS — E0590 Thyrotoxicosis, unspecified without thyrotoxic crisis or storm: Secondary | ICD-10-CM | POA: Diagnosis not present

## 2021-01-12 DIAGNOSIS — Z7901 Long term (current) use of anticoagulants: Secondary | ICD-10-CM | POA: Diagnosis not present

## 2021-01-12 LAB — COMPREHENSIVE METABOLIC PANEL
ALT: 17 U/L (ref 0–44)
AST: 98 U/L — ABNORMAL HIGH (ref 15–41)
Albumin: 4 g/dL (ref 3.5–5.0)
Alkaline Phosphatase: 53 U/L (ref 38–126)
Anion gap: 12 (ref 5–15)
BUN: 19 mg/dL (ref 8–23)
CO2: 27 mmol/L (ref 22–32)
Calcium: 10 mg/dL (ref 8.9–10.3)
Chloride: 97 mmol/L — ABNORMAL LOW (ref 98–111)
Creatinine, Ser: 0.84 mg/dL (ref 0.61–1.24)
GFR, Estimated: >60 mL/min (ref >60)
Glucose, Bld: 98 mg/dL (ref 70–99)
Potassium: 3.6 mmol/L (ref 3.5–5.1)
Sodium: 136 mmol/L (ref 135–145)
Total Bilirubin: 1.9 mg/dL — ABNORMAL HIGH (ref 0.3–1.2)
Total Protein: 6.7 g/dL (ref 6.5–8.1)

## 2021-01-12 LAB — PROTIME-INR
INR: 2.4 — ABNORMAL HIGH (ref 0.8–1.2)
Prothrombin Time: 25.9 seconds — ABNORMAL HIGH (ref 11.4–15.2)

## 2021-01-12 LAB — CBC WITH DIFFERENTIAL/PLATELET
Abs Immature Granulocytes: 0.03 10*3/uL (ref 0.00–0.07)
Basophils Absolute: 0 10*3/uL (ref 0.0–0.1)
Basophils Relative: 0 %
Eosinophils Absolute: 0 10*3/uL (ref 0.0–0.5)
Eosinophils Relative: 0 %
HCT: 40.2 % (ref 39.0–52.0)
Hemoglobin: 13.5 g/dL (ref 13.0–17.0)
Immature Granulocytes: 0 %
Lymphocytes Relative: 16 %
Lymphs Abs: 1.3 10*3/uL (ref 0.7–4.0)
MCH: 31.1 pg (ref 26.0–34.0)
MCHC: 33.6 g/dL (ref 30.0–36.0)
MCV: 92.6 fL (ref 80.0–100.0)
Monocytes Absolute: 1 10*3/uL (ref 0.1–1.0)
Monocytes Relative: 13 %
Neutro Abs: 5.6 10*3/uL (ref 1.7–7.7)
Neutrophils Relative %: 71 %
Platelets: 221 10*3/uL (ref 150–400)
RBC: 4.34 MIL/uL (ref 4.22–5.81)
RDW: 15 % (ref 11.5–15.5)
WBC: 7.9 10*3/uL (ref 4.0–10.5)
nRBC: 0 % (ref 0.0–0.2)

## 2021-01-12 LAB — ACTH STIMULATION, 3 TIME POINTS
Cortisol, 30 Min: 13.7 ug/dL
Cortisol, 60 Min: 15.8 ug/dL
Cortisol, Base: 16.9 ug/dL

## 2021-01-12 LAB — MAGNESIUM: Magnesium: 2.2 mg/dL (ref 1.7–2.4)

## 2021-01-12 LAB — PHOSPHORUS: Phosphorus: 2.7 mg/dL (ref 2.5–4.6)

## 2021-01-12 MED ORDER — LOPERAMIDE HCL 2 MG PO CAPS
2.0000 mg | ORAL_CAPSULE | ORAL | Status: AC | PRN
  Administered 2021-01-12: 2 mg via ORAL
  Filled 2021-01-12: qty 1

## 2021-01-12 MED ORDER — WARFARIN SODIUM 5 MG PO TABS
5.0000 mg | ORAL_TABLET | Freq: Once | ORAL | Status: AC
  Administered 2021-01-12: 5 mg via ORAL
  Filled 2021-01-12: qty 1

## 2021-01-12 MED ORDER — POTASSIUM CHLORIDE CRYS ER 20 MEQ PO TBCR: 40.0000 meq | EXTENDED_RELEASE_TABLET | Freq: Once | ORAL | Status: DC

## 2021-01-12 MED ORDER — COSYNTROPIN 0.25 MG IJ SOLR
0.2500 mg | Freq: Once | INTRAMUSCULAR | Status: AC
  Administered 2021-01-12: 0.25 mg via INTRAVENOUS
  Filled 2021-01-12 (×2): qty 0.25

## 2021-01-12 NOTE — Plan of Care (Signed)
  Problem: Education: Goal: Knowledge of General Education information will improve Description Including pain rating scale, medication(s)/side effects and non-pharmacologic comfort measures Outcome: Progressing   Problem: Health Behavior/Discharge Planning: Goal: Ability to manage health-related needs will improve Outcome: Progressing   

## 2021-01-12 NOTE — Progress Notes (Signed)
ANTICOAGULATION CONSULT NOTE -    Pharmacy Consult for warfarin dosing  Indication: atrial fibrillation   Allergies  Allergen Reactions   Adhesive [Tape]    Sulfa Antibiotics     UNKNOWN Childhood reaction      Patient Measurements: Last Weight  Most recent update: 01/08/2021  9:38 AM    Weight  113.4 kg (250 lb)            Body mass index is 33.91 kg/m. Roy [name]               Temp: 98.2 F (36.8 C) (09/08 0405) BP: 119/71 (09/08 0405) Pulse Rate: 61 (09/08 0405)  Labs: Recent Labs    01/10/21 0600 01/11/21 0534 01/12/21 0531  HGB 16.1 14.6 13.5  HCT 46.8 42.3 40.2  PLT 198 192 221  LABPROT 28.2* 23.9* 25.9*  INR 2.6* 2.1* 2.4*  CREATININE 0.68 0.70 0.84     Estimated Creatinine Clearance: 106.4 mL/min (by C-G formula based on SCr of 0.84 mg/dL).     Medications:  Medications Prior to Admission  Medication Sig Dispense Refill Last Dose   Calcium Carbonate-Vitamin D (CALCIUM-VITAMIN D) 600-125 MG-UNIT TABS Take by mouth.   01/07/2021   Cholecalciferol (VITAMIN D3) 25 MCG (1000 UT) CAPS Take by mouth daily.   01/07/2021   magnesium oxide (MAG-OX) 400 (240 Mg) MG tablet Take 1 tablet (400 mg total) by mouth 2 (two) times daily. 60 tablet 1 01/07/2021   meclizine (ANTIVERT) 12.5 MG tablet Take 1 tablet (12.5 mg total) by mouth 3 (three) times daily as needed for dizziness. 30 tablet 0 01/07/2021   omeprazole (PRILOSEC) 10 MG capsule Take 1 capsule by mouth daily.   01/07/2021   potassium chloride (KLOR-CON) 10 MEQ tablet Take 10 mEq by mouth 2 (two) times daily.   01/07/2021   rosuvastatin (CRESTOR) 5 MG tablet Take 1 tablet by mouth at bedtime.   01/07/2021   warfarin (COUMADIN) 5 MG tablet Take 1 tablet (5 mg total) by mouth daily at 4 PM. (Patient taking differently: Take 5 mg by mouth every evening.)   01/07/2021 at 1600   amLODipine (NORVASC) 2.5 MG tablet Take 1 tablet (2.5 mg total) by mouth daily. (Patient not taking: No sig reported) 30 tablet 1    magnesium  oxide (MAG-OX) 400 MG tablet Take by mouth. (Patient not taking: No sig reported)      potassium chloride (KLOR-CON) 10 MEQ tablet Take by mouth. (Patient not taking: No sig reported)      Scheduled:   cosyntropin  0.25 mg Intravenous Once   feeding supplement  237 mL Oral BID BM   midodrine  5 mg Oral TID WC   potassium chloride  10 mEq Oral BID   rosuvastatin  5 mg Oral QHS   Warfarin - Pharmacist Dosing Inpatient   Does not apply q1600   Infusions:   PRN:  Anti-infectives (From admission, onward)    None       Goal of Therapy:  INR 2-3 Monitor platelets by anticoagulation protocol: Yes    Prior to Admission Warfarin Dosing:  Roy [name] takes '5mg'$  of warfarin daily     Admit INR was 3.6 Lab Results  Component Value Date   INR 2.4 (H) 01/12/2021   INR 2.1 (H) 01/11/2021   INR 2.6 (H) 01/10/2021    Assessment: Roy [name] a 70 y.o. male requires anticoagulation with warfarin for the indication of  atrial fibrillation. Warfarin will be initiated  inpatient following pharmacy protocol per pharmacy consult. Patient most recent blood work is as follows: CBC Latest Ref Rng & Units 01/12/2021 01/11/2021 01/10/2021  WBC 4.0 - 10.5 K/uL 7.9 7.2 3.7(L)  Hemoglobin 13.0 - 17.0 g/dL 13.5 14.6 16.1  Hematocrit 39.0 - 52.0 % 40.2 42.3 46.8  Platelets 150 - 400 K/uL 221 192 198   INR 2.4 today, therapeutic  Home dose is '5mg'$  daily  Plan:  Warfarin '5mg'$  x 1 dose tonight  Monitor CBC daily with am labs   Monitor INR daily Monitor for signs and symptoms of bleeding   Isac Sarna, BS [place], BCPS Clinical Pharmacist Pager [phone]

## 2021-01-12 NOTE — Progress Notes (Signed)
PROGRESS NOTE    Roy [name]  [id number] DOB: 11-May-1950 DOA: 01/08/2021 PCP: Bridget Hartshorn, NP   Brief Narrative:  70 y.o. WM PMHx afib on chronic anticoagulation, trifascicular block, left anterior fascicular block, RBBB essential HTN, gerd, HLD, obesity. Presents to the ED today for eval of generalized weakness for the last month.  Patient was found to have low a.m. cortisol concerning for adrenal insufficiency, initiated hydrocortisone. Stim test has been ordered.    Assessment & Plan:   Active Problems:   PAF (paroxysmal atrial fibrillation) (HCC)   RBBB   First degree AV block   Essential hypertension   Trifascicular block   LAFB (left anterior fascicular block)   Obesity (BMI 30-39.9)   Chronic anticoagulation   Hyperthyroidism   Hyponatremia   Weakness   Adrenal insufficiency (HCC)   Generalized weakness Intermittent hypotension Adrenal insufficiency -A.m. cortisol less than 0.4.  Hydrocortisone 100 mg 3 times daily on hold. Stim test drawn inappropriate by lab this morning, will repeat it again. Spoke with the lab, it should result later tonight or tomorrow morning. Send out to [facility] lab.  - Echocardiogram 12/20/2020 '@[facility]'$ -EF 55 to 60%, mild AR, mild AAS - TSH, free T4-normal -Midodrine 5 mg 3 times daily   Symptomatic bradycardia -Received atropine during hospitalization.  Recent echocardiogram as above   Paroxysmal atrial fibrillation -Currently normal sinus rhythm.  Warfarin dosing per pharmacy    Hyponatremia -Resolved.     DVT prophylaxis:  warfarin (COUMADIN) tablet 5 mg  Code Status: Full Code Family Communication:  Son updated yesterday, called today - no answer.   Status is: Inpatient. On IV hydrocortisone which is currently on hold to perform Stim Test.   Dispo: The patient is from: Home              Anticipated d/c is to: Home              Patient currently is not medically stable to d/c. Plan for stim test today, will await  results. Hopefully can dc tomorr.w    Difficult to place patient No       Subjective: No complaints, no acute events overnight.   Review of Systems Otherwise negative except as per HPI, including: General = no fevers, chills, dizziness,  fatigue HEENT/EYES = negative for loss of vision, double vision, blurred vision,  sore throa Cardiovascular= negative for chest pain, palpitation Respiratory/lungs= negative for shortness of breath, cough, wheezing; hemoptysis,  Gastrointestinal= negative for nausea, vomiting, abdominal pain Genitourinary= negative for Dysuria MSK = Negative for arthralgia, myalgias Neurology= Negative for headache, numbness, tingling  Psychiatry= Negative for suicidal and homocidal ideation Skin= Negative for Rash   Examination:  Constitutional: Not in acute distress Respiratory: Clear to auscultation bilaterally Cardiovascular: Normal sinus rhythm, no rubs Abdomen: Nontender nondistended good bowel sounds Musculoskeletal: No edema noted Skin: No rashes seen Neurologic: CN 2-12 grossly intact.  And nonfocal Psychiatric: Normal judgment and insight. Alert and oriented x 3. Normal mood.      Objective: Vitals:   01/11/21 0408 01/11/21 1452 01/11/21 2029 01/12/21 0405  BP: 122/83 132/66 125/77 119/71  Pulse: 72 65 (!) 54 61  Resp: '18 15 18 20  '$ Temp: 98.2 F (36.8 C) 97.6 F (36.4 C) (!) 97.3 F (36.3 C) 98.2 F (36.8 C)  TempSrc: Oral Oral Oral   SpO2: 93% 95% 96% 93%  Weight:      Height:        Intake/Output Summary (Last 24 hours) at  01/12/2021 1202 Last data filed at 01/12/2021 0900 Gross per 24 hour  Intake 800 ml  Output --  Net 800 ml   Filed Weights   01/08/21 0937  Weight: 113.4 kg     Data Reviewed:   CBC: Recent Labs  Lab 01/08/21 0958 01/09/21 0455 01/10/21 0600 01/11/21 0534 01/12/21 0531  WBC 3.6* 2.8* 3.7* 7.2 7.9  NEUTROABS  --  1.1* 1.4* 6.2 5.6  HGB 15.8 14.8 16.1 14.6 13.5  HCT 44.1 41.8 46.8 42.3 40.2   MCV 87.8 88.0 90.3 91.6 92.6  PLT 177 170 198 192 [id number]   Basic Metabolic Panel: Recent Labs  Lab 01/08/21 0958 01/09/21 0455 01/10/21 0600 01/11/21 0534 01/12/21 0531  NA 125* 127* 129* 132* 136  K 3.8 4.0 4.1 4.5 3.6  CL 89* 90* 91* 99 97*  CO2 '25 27 26 '$ 21* 27  GLUCOSE 86 78 96 140* 98  BUN 7* '8 9 16 19  '$ CREATININE 0.71 0.66 0.68 0.70 0.84  CALCIUM 9.4 9.3 9.7 9.7 10.0  MG  --  1.6* 2.0 2.0 2.2  PHOS  --  3.2 3.9 2.5 2.7   GFR: Estimated Creatinine Clearance: 106.4 mL/min (by C-G formula based on SCr of 0.84 mg/dL). Liver Function Tests: Recent Labs  Lab 01/08/21 0958 01/09/21 0455 01/10/21 0600 01/11/21 0534 01/12/21 0531  AST 119* 109* 117* 123* 98*  ALT '15 17 16 18 17  '$ ALKPHOS 65 61 65 61 53  BILITOT 3.0* 3.0* 2.5* 2.3* 1.9*  PROT 6.6 6.2* 6.9 7.2 6.7  ALBUMIN 3.8 3.6 3.9 4.2 4.0   No results for input(s): LIPASE, AMYLASE in the last 168 hours. No results for input(s): AMMONIA in the last 168 hours. Coagulation Profile: Recent Labs  Lab 01/08/21 0958 01/09/21 0455 01/10/21 0600 01/11/21 0534 01/12/21 0531  INR 3.6* 3.4* 2.6* 2.1* 2.4*   Cardiac Enzymes: No results for input(s): CKTOTAL, CKMB, CKMBINDEX, TROPONINI in the last 168 hours. BNP (last 3 results) No results for input(s): PROBNP in the last 8760 hours. HbA1C: No results for input(s): HGBA1C in the last 72 hours.  CBG: Recent Labs  Lab 01/08/21 1017  GLUCAP 89   Lipid Profile: No results for input(s): CHOL, HDL, LDLCALC, TRIG, CHOLHDL, LDLDIRECT in the last 72 hours. Thyroid Function Tests: No results for input(s): TSH, T4TOTAL, FREET4, T3FREE, THYROIDAB in the last 72 hours.  Anemia Panel: No results for input(s): VITAMINB12, FOLATE, FERRITIN, TIBC, IRON, RETICCTPCT in the last 72 hours. Sepsis Labs: No results for input(s): PROCALCITON, LATICACIDVEN in the last 168 hours.  Recent Results (from the past 240 hour(s))  Resp Panel by RT-PCR (Flu A&B, Covid) Nasopharyngeal Swab      Status: None   Collection Time: 01/08/21  9:51 AM   Specimen: Nasopharyngeal Swab; Nasopharyngeal(NP) swabs in vial transport medium  Result Value Ref Range Status   SARS Coronavirus 2 by RT PCR NEGATIVE NEGATIVE Final    Comment: (NOTE) SARS-CoV-2 target nucleic acids are NOT DETECTED.  The SARS-CoV-2 RNA is generally detectable in upper respiratory specimens during the acute phase of infection. The lowest concentration of SARS-CoV-2 viral copies this assay can detect is 138 copies/mL. A negative result does not preclude SARS-Cov-2 infection and should not be used as the sole basis for treatment or other patient management decisions. A negative result may occur with  improper specimen collection/handling, submission of specimen other than nasopharyngeal swab, presence of viral mutation(s) within the areas targeted by this assay, and inadequate number of viral copies(<138  copies/mL). A negative result must be combined with clinical observations, patient history, and epidemiological information. The expected result is Negative.  Fact Sheet for Patients:  EntrepreneurPulse.com.au  Fact Sheet for Healthcare Providers:  IncredibleEmployment.be  This test is no t yet approved or cleared by the [place] FDA and  has been authorized for detection and/or diagnosis of SARS-CoV-2 by FDA under an Emergency Use Authorization (EUA). This EUA will remain  in effect (meaning this test can be used) for the duration of the COVID-19 declaration under Section 564(b)(1) of the Act, 21 U.S.C.section 360bbb-3(b)(1), unless the authorization is terminated  or revoked sooner.       Influenza A by PCR NEGATIVE NEGATIVE Final   Influenza B by PCR NEGATIVE NEGATIVE Final    Comment: (NOTE) The Xpert Xpress SARS-CoV-2/FLU/RSV plus assay is intended as an aid in the diagnosis of influenza from Nasopharyngeal swab specimens and should not be used as a sole basis  for treatment. Nasal washings and aspirates are unacceptable for Xpert Xpress SARS-CoV-2/FLU/RSV testing.  Fact Sheet for Patients: EntrepreneurPulse.com.au  Fact Sheet for Healthcare Providers: IncredibleEmployment.be  This test is not yet approved or cleared by the [place] FDA and has been authorized for detection and/or diagnosis of SARS-CoV-2 by FDA under an Emergency Use Authorization (EUA). This EUA will remain in effect (meaning this test can be used) for the duration of the COVID-19 declaration under Section 564(b)(1) of the Act, 21 U.S.C. section 360bbb-3(b)(1), unless the authorization is terminated or revoked.  Performed at [facility], [address] [place], [place] [address]   Urine Culture     Status: None   Collection Time: 01/08/21 10:32 AM   Specimen: Urine, Clean Catch  Result Value Ref Range Status   Specimen Description   Final    URINE, CLEAN CATCH Performed at [facility], [address] [place], [place] [address]    Special Requests   Final    NONE Performed at [facility], [address], [place], [place] [address]    Culture   Final    NO GROWTH Performed at [facility], [place] [address], [place], [place] [address]    Report Status 01/11/2021 FINAL  Final  Culture, blood (routine x 2)     Status: None (Preliminary result)   Collection Time: 01/08/21  1:13 PM   Specimen: BLOOD LEFT HAND  Result Value Ref Range Status   Specimen Description   Final    BLOOD LEFT HAND BOTTLES DRAWN AEROBIC AND ANAEROBIC   Special Requests PENDING  Incomplete   Culture   Final    NO GROWTH 4 DAYS Performed at [facility], [address] [place], [place] [address]    Report Status PENDING  Incomplete  Culture, blood (routine x 2)     Status: None (Preliminary result)   Collection Time: 01/08/21  1:13 PM   Specimen: Left Antecubital; Blood  Result Value Ref Range Status   Specimen Description   Final    LEFT  ANTECUBITAL BOTTLES DRAWN AEROBIC AND ANAEROBIC   Special Requests Blood Culture adequate volume  Final   Culture   Final    NO GROWTH 4 DAYS Performed at [facility], [address] [place],  [address]    Report Status PENDING  Incomplete         Radiology Studies: No results found.      Scheduled Meds:  feeding supplement  237 mL Oral BID BM   midodrine  5 mg  Oral TID WC   potassium chloride  10 mEq Oral BID   rosuvastatin  5 mg Oral QHS   warfarin  5 mg Oral ONCE-1600   Warfarin - Pharmacist Dosing Inpatient   Does not apply q1600   Continuous Infusions:   LOS: 1 day   Time spent= 35 mins    Xochilth Standish Arsenio Loader, MD [facility]  If 7PM-7AM, please contact night-coverage  01/12/2021, 12:02 PM

## 2021-01-13 DIAGNOSIS — R0600 Dyspnea, unspecified: Secondary | ICD-10-CM | POA: Diagnosis not present

## 2021-01-13 DIAGNOSIS — E669 Obesity, unspecified: Secondary | ICD-10-CM | POA: Diagnosis not present

## 2021-01-13 DIAGNOSIS — R531 Weakness: Secondary | ICD-10-CM | POA: Diagnosis not present

## 2021-01-13 DIAGNOSIS — E871 Hypo-osmolality and hyponatremia: Secondary | ICD-10-CM | POA: Diagnosis not present

## 2021-01-13 LAB — CULTURE, BLOOD (ROUTINE X 2)
Culture: NO GROWTH
Culture: NO GROWTH
Special Requests: ADEQUATE
Special Requests: ADEQUATE

## 2021-01-13 LAB — CBC WITH DIFFERENTIAL/PLATELET
Basophils Absolute: 0 10*3/uL (ref 0.0–0.1)
Basophils Relative: 0 %
Eosinophils Absolute: 0 10*3/uL (ref 0.0–0.5)
Eosinophils Relative: 0 %
HCT: 41 % (ref 39.0–52.0)
Hemoglobin: 13.8 g/dL (ref 13.0–17.0)
Lymphocytes Relative: 42 %
Lymphs Abs: 1.7 10*3/uL (ref 0.7–4.0)
MCH: 31.1 pg (ref 26.0–34.0)
MCHC: 33.7 g/dL (ref 30.0–36.0)
MCV: 92.3 fL (ref 80.0–100.0)
Monocytes Absolute: 0.4 10*3/uL (ref 0.1–1.0)
Monocytes Relative: 10 %
Neutro Abs: 1.9 10*3/uL (ref 1.7–7.7)
Neutrophils Relative %: 48 %
Platelets: 214 10*3/uL (ref 150–400)
RBC: 4.44 MIL/uL (ref 4.22–5.81)
RDW: 15 % (ref 11.5–15.5)
WBC: 4 10*3/uL (ref 4.0–10.5)
nRBC: 0 % (ref 0.0–0.2)

## 2021-01-13 LAB — COMPREHENSIVE METABOLIC PANEL
ALT: 19 U/L (ref 0–44)
AST: 85 U/L — ABNORMAL HIGH (ref 15–41)
Albumin: 3.9 g/dL (ref 3.5–5.0)
Alkaline Phosphatase: 50 U/L (ref 38–126)
Anion gap: 9 (ref 5–15)
BUN: 17 mg/dL (ref 8–23)
CO2: 28 mmol/L (ref 22–32)
Calcium: 9.3 mg/dL (ref 8.9–10.3)
Chloride: 99 mmol/L (ref 98–111)
Creatinine, Ser: 0.69 mg/dL (ref 0.61–1.24)
GFR, Estimated: >60 mL/min (ref >60)
Glucose, Bld: 79 mg/dL (ref 70–99)
Potassium: 3.7 mmol/L (ref 3.5–5.1)
Sodium: 136 mmol/L (ref 135–145)
Total Bilirubin: 2 mg/dL — ABNORMAL HIGH (ref 0.3–1.2)
Total Protein: 6.5 g/dL (ref 6.5–8.1)

## 2021-01-13 LAB — PHOSPHORUS: Phosphorus: 3.6 mg/dL (ref 2.5–4.6)

## 2021-01-13 LAB — MAGNESIUM: Magnesium: 2 mg/dL (ref 1.7–2.4)

## 2021-01-13 LAB — PROTIME-INR
INR: 3.1 — ABNORMAL HIGH (ref 0.8–1.2)
Prothrombin Time: 31.9 seconds — ABNORMAL HIGH (ref 11.4–15.2)

## 2021-01-13 MED ORDER — WARFARIN SODIUM 5 MG PO TABS: 5.0000 mg | ORAL_TABLET | Freq: Every evening | ORAL | Status: DC

## 2021-01-13 MED ORDER — MIDODRINE HCL 5 MG PO TABS: 5.0000 mg | ORAL_TABLET | Freq: Three times a day (TID) | ORAL | 1 refills | Status: DC

## 2021-01-13 MED ORDER — ACETAMINOPHEN 325 MG PO TABS: 650.0000 mg | ORAL_TABLET | Freq: Four times a day (QID) | ORAL | 1 refills | Status: DC | PRN

## 2021-01-13 NOTE — Care Management Important Message (Signed)
Important Message  Patient Details  Name: Roy [name] MRN: [id number] Date of Birth: 03-Dec-1950   Medicare Important Message Given:  Yes     Boneta Lucks, RN 01/13/2021, 12:34 PM

## 2021-01-13 NOTE — Discharge Instructions (Addendum)
1)Hold Coumdain/Warfarin on Friday, 01/13/2021 on Saturday 01/14/2021, may restart Coumadin on Sunday, 01/15/2021 2) repeat PT/INR advised around Monday, 01/16/2021 3)Avoid ibuprofen/Advil/Aleve/Motrin/Goody Powders/Naproxen/BC powders/Meloxicam/Diclofenac/Indomethacin and other Nonsteroidal anti-inflammatory medications as these will make you more likely to bleed and can cause stomach ulcers, can also cause Kidney problems.  4)Please call and make appointment with endocrinologist --Dr. Loni Beckwith, MD, Endocrinology, Diabetes & Metabolism-- [facility], [place], [place] [address], Phone Number- [phone] -You may be having low blood pressure due to low cortisol level, and the endocrinologist can help you work through this 5) please follow-up with atrial fibrillation clinic as advised 6) please stop amlodipine due to concerns about low blood pressure, please take midodrine 5 mg 3 times a day to prevent your blood pressure from dropping too low when you get up Suspected adrenal insufficiency with orthostatic hypotension-- follow up with Endocrinologist Dr. Loni Beckwith, MD, Endocrinology, Diabetes & Metabolism-- [facility], [address] [place], [place] [address], Phone Number- [phone]

## 2021-01-13 NOTE — Progress Notes (Signed)
ANTICOAGULATION CONSULT NOTE -    Pharmacy Consult for warfarin dosing  Indication: atrial fibrillation   Allergies  Allergen Reactions   Adhesive [Tape]    Sulfa Antibiotics     UNKNOWN Childhood reaction      Patient Measurements: Last Weight  Most recent update: 01/08/2021  9:38 AM    Weight  113.4 kg (250 lb)            Body mass index is 33.91 kg/m. [name]               Temp: 97.7 F (36.5 C) (09/09 0440) BP: 101/55 (09/09 0440) Pulse Rate: 70 (09/09 0440)  Labs: Recent Labs    01/11/21 0534 01/12/21 0531 01/13/21 0814  HGB 14.6 13.5 13.8  HCT 42.3 40.2 41.0  PLT 192 221 214  LABPROT 23.9* 25.9* 31.9*  INR 2.1* 2.4* 3.1*  CREATININE 0.70 0.84 0.69     Estimated Creatinine Clearance: 111.7 mL/min (by C-G formula based on SCr of 0.69 mg/dL).     Medications:  Medications Prior to Admission  Medication Sig Dispense Refill Last Dose   Calcium Carbonate-Vitamin D (CALCIUM-VITAMIN D) 600-125 MG-UNIT TABS Take by mouth.   01/07/2021   Cholecalciferol (VITAMIN D3) 25 MCG (1000 UT) CAPS Take by mouth daily.   01/07/2021   magnesium oxide (MAG-OX) 400 (240 Mg) MG tablet Take 1 tablet (400 mg total) by mouth 2 (two) times daily. 60 tablet 1 01/07/2021   meclizine (ANTIVERT) 12.5 MG tablet Take 1 tablet (12.5 mg total) by mouth 3 (three) times daily as needed for dizziness. 30 tablet 0 01/07/2021   omeprazole (PRILOSEC) 10 MG capsule Take 1 capsule by mouth daily.   01/07/2021   potassium chloride (KLOR-CON) 10 MEQ tablet Take 10 mEq by mouth 2 (two) times daily.   01/07/2021   rosuvastatin (CRESTOR) 5 MG tablet Take 1 tablet by mouth at bedtime.   01/07/2021   warfarin (COUMADIN) 5 MG tablet Take 1 tablet (5 mg total) by mouth daily at 4 PM. (Patient taking differently: Take 5 mg by mouth every evening.)   01/07/2021 at 1600   amLODipine (NORVASC) 2.5 MG tablet Take 1 tablet (2.5 mg total) by mouth daily. (Patient not taking: No sig reported) 30 tablet 1    magnesium  oxide (MAG-OX) 400 MG tablet Take by mouth. (Patient not taking: No sig reported)      [EXPIRED] potassium chloride (KLOR-CON) 10 MEQ tablet Take by mouth. (Patient not taking: No sig reported)      Scheduled:   feeding supplement  237 mL Oral BID BM   midodrine  5 mg Oral TID WC   potassium chloride  10 mEq Oral BID   rosuvastatin  5 mg Oral QHS   Warfarin - Pharmacist Dosing Inpatient   Does not apply q1600   Infusions:   PRN:  Anti-infectives (From admission, onward)    None       Goal of Therapy:  INR 2-3 Monitor platelets by anticoagulation protocol: Yes    Prior to Admission Warfarin Dosing:  [name] takes '5mg'$  of warfarin daily     Admit INR was 3.6 Lab Results  Component Value Date   INR 3.1 (H) 01/13/2021   INR 2.4 (H) 01/12/2021   INR 2.1 (H) 01/11/2021    Assessment: JIMM SALK a 70 y.o. male requires anticoagulation with warfarin for the indication of  atrial fibrillation. Warfarin will be initiated inpatient following pharmacy protocol per pharmacy consult.  Patient most recent blood work is as follows: CBC Latest Ref Rng & Units 01/13/2021 01/12/2021 01/11/2021  WBC 4.0 - 10.5 K/uL 4.0 7.9 7.2  Hemoglobin 13.0 - 17.0 g/dL 13.8 13.5 14.6  Hematocrit 39.0 - 52.0 % 41.0 40.2 42.3  Platelets 150 - 400 K/uL 214 221 192   INR 2.4>> 3.1.  Home dose is '5mg'$  daily. Will need lowered based on INR going supratherapeutic again  Plan:  Hold warfarin x 1 dose. Monitor CBC daily with am labs   Monitor INR daily Monitor for signs and symptoms of bleeding   Margot Ables, PharmD Clinical Pharmacist 01/13/2021 9:06 AM

## 2021-01-13 NOTE — Discharge Summary (Signed)
Roy [name], is a 70 y.o. male  DOB 1950-07-08  MRN [id number].  Admission date:  01/08/2021  Admitting Physician  Ankit Arsenio Loader, MD  Discharge Date:  01/13/2021   Primary MD  Bridget Hartshorn, NP  Recommendations for primary care physician for things to follow:   1)Hold Coumdain/Warfarin on Friday, 01/13/2021 on Saturday 01/14/2021, may restart Coumadin on Sunday, 01/15/2021 2) repeat PT/INR advised around Monday, 01/16/2021 3)Avoid ibuprofen/Advil/Aleve/Motrin/Goody Powders/Naproxen/BC powders/Meloxicam/Diclofenac/Indomethacin and other Nonsteroidal anti-inflammatory medications as these will make you more likely to bleed and can cause stomach ulcers, can also cause Kidney problems.  4)Please call and make appointment with endocrinologist --Dr. Loni Beckwith, MD, Endocrinology, Diabetes & Metabolism-- [facility], [place], [place] [address], Phone Number- [phone] -You may be having low blood pressure due to low cortisol level, and the endocrinologist can help you work through this 5) please follow-up with atrial fibrillation clinic as advised 6) please stop amlodipine due to concerns about low blood pressure, please take midodrine 5 mg 3 times a day to prevent your blood pressure from dropping too low when you get up Suspected adrenal insufficiency with orthostatic hypotension-- follow up with Endocrinologist Dr. Loni Beckwith, MD, Endocrinology, Diabetes & Metabolism-- [facility], [address] [place], [place] [address], Phone Number- [phone]   Admission Diagnosis  Hyponatremia [E87.1] Weakness [R53.1] Dyspnea on exertion [R06.00] Obesity (BMI 30-39.9) [E66.9] Adrenal insufficiency (HCC) [E27.40]   Discharge Diagnosis  Hyponatremia [E87.1] Weakness [R53.1] Dyspnea on exertion [R06.00] Obesity (BMI 30-39.9) [E66.9] Adrenal insufficiency (HCC) [E27.40]     Active Problems:   PAF (paroxysmal atrial fibrillation) (HCC)   RBBB   First degree AV block   Essential hypertension   Trifascicular block   LAFB (left anterior fascicular block)   Obesity (BMI 30-39.9)   Chronic anticoagulation   Hyperthyroidism   Hyponatremia   Weakness   Adrenal insufficiency (HCC)      Past Medical History:  Diagnosis Date   Atrial fibrillation (HCC)    GERD (gastroesophageal reflux disease)    High cholesterol    Hypertension    Obesity     Past Surgical History:  Procedure Laterality Date   APPENDECTOMY     CATARACT EXTRACTION Bilateral 2022   CHOLECYSTECTOMY     ESOPHAGEAL DILATION     multiple times   JOINT REPLACEMENT Left    hip     HPI  from the history and physical done on the day of admission:    Chief Complaint: Increasing generalized weakness over the last month   HPI: Roy [name] is a 70 y.o. WM PMHx afib on chronic anticoagulation, trifascicular block, left anterior fascicular block, RBBB essential HTN, gerd, HLD, obesity,    Presents to the ED today for eval of generalized weakness for the last month. States he has felt sob for the last few weeks as well. Report diarrhea x2 days. Denies ble swelling, cough, chest pain, vomiting, abd pain, bloody stools, fevers. Sxs constant and have worsened since onset. Denies exacerbating  or alleviating factors.  States has not been eating for~30 days (just has not felt hungry)    Hospital Course:    Generalized weakness Intermittent hypotension Adrenal insufficiency -A.m. cortisol was initially low -ACTH stimulation test repeated -Discussed with endocrinologist Dr. Loni Beckwith -Hold off on hydrocortisone at this time -Discharged on midodrine for orthostatic hypotension -Follow-up with endocrinologist Dr. Loni Beckwith for further work-up for possible adrenal insufficiency - Echocardiogram 12/20/2020 '@[facility]'$ -EF 55 to 60%, mild AR, mild AAS - TSH, free  T4-normal -Midodrine 5 mg 3 times daily   Symptomatic bradycardia -Received atropine during hospitalization.  Recent echocardiogram as above -Repeat EKG with atrial fibrillation, right bundle branch block and left anterior fascicular block   Paroxysmal atrial fibrillation - Repeat EKG with atrial fibrillation, right bundle branch block and left anterior fascicular block -Continue Coumadin  Hyponatremia -125 >> 136   Discharge Condition: stable  Follow UP   Follow-up Information     Cassandria Anger, MD. Schedule an appointment as soon as possible for a visit in 1 week(s).   Specialty: Endocrinology Why: -- Suspected adrenal insufficiency with orthostatic hypotension Contact information: [place] [address] [phone]                  Consults obtained - outpt follow up with  Endocrinologist Dr. Loni Beckwith, MD,   Diet and Activity recommendation:  As advised  Discharge Instructions    Discharge Instructions     Amb referral to AFIB Clinic   Complete by: As directed    Call MD for:  difficulty breathing, headache or visual disturbances   Complete by: As directed    Call MD for:  persistant dizziness or light-headedness   Complete by: As directed    Call MD for:  persistant nausea and vomiting   Complete by: As directed    Call MD for:  redness, tenderness, or signs of infection (pain, swelling, redness, odor or green/yellow discharge around incision site)   Complete by: As directed    Call MD for:  temperature >100.4   Complete by: As directed    Diet - low sodium heart healthy   Complete by: As directed    Discharge instructions   Complete by: As directed    1)Hold Coumdain/Warfarin on Friday, 01/13/2021 on Saturday 01/14/2021, may restart Coumadin on Sunday, 01/15/2021 2) repeat PT/INR advised around Monday, 01/16/2021 3)Avoid ibuprofen/Advil/Aleve/Motrin/Goody Powders/Naproxen/BC powders/Meloxicam/Diclofenac/Indomethacin  and other Nonsteroidal anti-inflammatory medications as these will make you more likely to bleed and can cause stomach ulcers, can also cause Kidney problems.  4)Please call and make appointment with endocrinologist --Dr. Loni Beckwith, MD, Endocrinology, Diabetes & Metabolism-- [facility], [place], [place] [address], Phone Number- [phone] -You may be having low blood pressure due to low cortisol level, and the endocrinologist can help you work through this 5) please follow-up with atrial fibrillation clinic as advised 6) please stop amlodipine due to concerns about low blood pressure, please take midodrine 5 mg 3 times a day to prevent your blood pressure from dropping too low when you get up   Increase activity slowly   Complete by: As directed        Discharge Medications     Allergies as of 01/13/2021       Reactions   Adhesive [tape]    Sulfa Antibiotics    UNKNOWN Childhood reaction        Medication List     STOP taking these medications  amLODipine 2.5 MG tablet Commonly known as: NORVASC   magnesium oxide 400 MG tablet Commonly known as: MAG-OX   meclizine 12.5 MG tablet Commonly known as: ANTIVERT   potassium chloride 10 MEQ tablet Commonly known as: KLOR-CON       TAKE these medications    acetaminophen 325 MG tablet Commonly known as: TYLENOL Take 2 tablets (650 mg total) by mouth every 6 (six) hours as needed for headache.   Calcium-Vitamin D 600-125 MG-UNIT Tabs Take by mouth.   magnesium oxide 400 (240 Mg) MG tablet Commonly known as: MAG-OX Take 1 tablet (400 mg total) by mouth 2 (two) times daily.   midodrine 5 MG tablet Commonly known as: PROAMATINE Take 1 tablet (5 mg total) by mouth 3 (three) times daily with meals.   omeprazole 10 MG capsule Commonly known as: PRILOSEC Take 1 capsule by mouth daily.   potassium chloride 10 MEQ tablet Commonly known as: KLOR-CON Take 10 mEq by mouth 2 (two) times daily.    rosuvastatin 5 MG tablet Commonly known as: CRESTOR Take 1 tablet by mouth at bedtime.   Vitamin D3 25 MCG (1000 UT) Caps Take by mouth daily.   warfarin 5 MG tablet Commonly known as: COUMADIN Take 1 tablet (5 mg total) by mouth every evening. Start taking on: January 15, 2021 What changed:  when to take this These instructions start on January 15, 2021. If you are unsure what to do until then, ask your doctor or other care provider.        Major procedures and Radiology Reports - PLEASE review detailed and final reports for all details, in brief -   DG Cervical Spine 2 or 3 views  Result Date: 01/09/2021 CLINICAL DATA:  Neck pain EXAM: CERVICAL SPINE - 2-3 VIEW COMPARISON:  None. FINDINGS: Suboptimal visualization of C6 and below. Dens and lateral masses are within normal limits. Imaged vertebral body heights appear normal. Diffuse ankylosis. Normal prevertebral soft tissue thickness. IMPRESSION: Limited visibility C6 and below. Diffuse ankylosis without definite acute osseous abnormality within the visualized portions of the cervical spine Electronically Signed   By: Donavan Foil M.D.   On: 01/09/2021 19:05   DG Chest Portable 1 View  Result Date: 01/08/2021 CLINICAL DATA:  Dyspnea, weakness, diarrhea EXAM: PORTABLE CHEST 1 VIEW COMPARISON:  03/26/2018 chest radiograph. FINDINGS: Stable cardiomediastinal silhouette with top-normal heart size. No pneumothorax. No pleural effusion. Lungs appear clear, with no acute consolidative airspace disease and no pulmonary edema. IMPRESSION: No active disease. Electronically Signed   By: Ilona Sorrel M.D.   On: 01/08/2021 10:17    Micro Results   Recent Results (from the past 240 hour(s))  Resp Panel by RT-PCR (Flu A&B, Covid) Nasopharyngeal Swab     Status: None   Collection Time: 01/08/21  9:51 AM   Specimen: Nasopharyngeal Swab; Nasopharyngeal(NP) swabs in vial transport medium  Result Value Ref Range Status   SARS Coronavirus 2 by  RT PCR NEGATIVE NEGATIVE Final    Comment: (NOTE) SARS-CoV-2 target nucleic acids are NOT DETECTED.  The SARS-CoV-2 RNA is generally detectable in upper respiratory specimens during the acute phase of infection. The lowest concentration of SARS-CoV-2 viral copies this assay can detect is 138 copies/mL. A negative result does not preclude SARS-Cov-2 infection and should not be used as the sole basis for treatment or other patient management decisions. A negative result may occur with  improper specimen collection/handling, submission of specimen other than nasopharyngeal swab, presence of viral mutation(s) within  the areas targeted by this assay, and inadequate number of viral copies(<138 copies/mL). A negative result must be combined with clinical observations, patient history, and epidemiological information. The expected result is Negative.  Fact Sheet for Patients:  EntrepreneurPulse.com.au  Fact Sheet for Healthcare Providers:  IncredibleEmployment.be  This test is no t yet approved or cleared by the [place] FDA and  has been authorized for detection and/or diagnosis of SARS-CoV-2 by FDA under an Emergency Use Authorization (EUA). This EUA will remain  in effect (meaning this test can be used) for the duration of the COVID-19 declaration under Section 564(b)(1) of the Act, 21 U.S.C.section 360bbb-3(b)(1), unless the authorization is terminated  or revoked sooner.       Influenza A by PCR NEGATIVE NEGATIVE Final   Influenza B by PCR NEGATIVE NEGATIVE Final    Comment: (NOTE) The Xpert Xpress SARS-CoV-2/FLU/RSV plus assay is intended as an aid in the diagnosis of influenza from Nasopharyngeal swab specimens and should not be used as a sole basis for treatment. Nasal washings and aspirates are unacceptable for Xpert Xpress SARS-CoV-2/FLU/RSV testing.  Fact Sheet for Patients: EntrepreneurPulse.com.au  Fact Sheet  for Healthcare Providers: IncredibleEmployment.be  This test is not yet approved or cleared by the [place] FDA and has been authorized for detection and/or diagnosis of SARS-CoV-2 by FDA under an Emergency Use Authorization (EUA). This EUA will remain in effect (meaning this test can be used) for the duration of the COVID-19 declaration under Section 564(b)(1) of the Act, 21 U.S.C. section 360bbb-3(b)(1), unless the authorization is terminated or revoked.  Performed at [facility], [address], [place], [place] [address]   Urine Culture     Status: None   Collection Time: 01/08/21 10:32 AM   Specimen: Urine, Clean Catch  Result Value Ref Range Status   Specimen Description   Final    URINE, CLEAN CATCH Performed at [facility], [address] [place], [place] [address]    Special Requests   Final    NONE Performed at [facility], [address] [place], [place] [address]    Culture   Final    NO GROWTH Performed at [facility], [place] [address], [place], [place] [address]    Report Status 01/11/2021 FINAL  Final  Culture, blood (routine x 2)     Status: None   Collection Time: 01/08/21  1:13 PM   Specimen: BLOOD LEFT HAND  Result Value Ref Range Status   Specimen Description   Final    BLOOD LEFT HAND BOTTLES DRAWN AEROBIC AND ANAEROBIC   Special Requests Blood Culture adequate volume  Final   Culture   Final    NO GROWTH 5 DAYS Performed at [facility], [address], [place], [place] [address]    Report Status 01/13/2021 FINAL  Final  Culture, blood (routine x 2)     Status: None   Collection Time: 01/08/21  1:13 PM   Specimen: Left Antecubital; Blood  Result Value Ref Range Status   Specimen Description   Final    LEFT ANTECUBITAL BOTTLES DRAWN AEROBIC AND ANAEROBIC   Special Requests Blood Culture adequate volume  Final   Culture   Final    NO GROWTH 5 DAYS Performed at [facility], [address], [place],  [address]     Report Status 01/13/2021 FINAL  Final       Today   Subjective    Roy [name] today has no new complaints  No  fever  Or chills  -Symptomatically no further orthostatic issues -No palpitations no dizziness -Patient's brother is at bedside          Patient has been seen and examined prior to discharge   Objective   Blood pressure (!) 98/49, pulse 61, temperature 98 F (36.7 C), temperature source Oral, resp. rate 16, height 6' (1.829 m), weight 113.4 kg, SpO2 97 %.   Intake/Output Summary (Last 24 hours) at 01/13/2021 1701 Last data filed at 01/13/2021 1300 Gross per 24 hour  Intake 720 ml  Output --  Net 720 ml   Exam Gen:- Awake Alert, no acute distress  HEENT:- .AT, No sclera icterus Neck-Supple Neck,No JVD,.  Lungs-  CTAB , good air movement bilaterally  CV- S1, S2 normal, regular Abd-  +ve B.Sounds, Abd Soft, No tenderness,    Extremity/Skin:- No  edema,   good pulses Psych-affect is appropriate, oriented x3 Neuro-no new focal deficits, no tremors    Data Review   CBC w Diff:  Lab Results  Component Value Date   WBC 4.0 01/13/2021   HGB 13.8 01/13/2021   HCT 41.0 01/13/2021   PLT 214 01/13/2021   LYMPHOPCT 42 01/13/2021   MONOPCT 10 01/13/2021   EOSPCT 0 01/13/2021   BASOPCT 0 01/13/2021    CMP:  Lab Results  Component Value Date   NA 136 01/13/2021   K 3.7 01/13/2021   CL 99 01/13/2021   CO2 28 01/13/2021   BUN 17 01/13/2021   CREATININE 0.69 01/13/2021   CREATININE 0.82 11/11/2020   PROT 6.5 01/13/2021   ALBUMIN 3.9 01/13/2021   BILITOT 2.0 (H) 01/13/2021   BILITOT 2.3 (H) 11/11/2020   ALKPHOS 50 01/13/2021   AST 85 (H) 01/13/2021   AST 74 (H) 11/11/2020   ALT 19 01/13/2021   ALT 21 11/11/2020  .   Total Discharge time is about 33 minutes  Roxan Hockey M.D on 01/13/2021 at 5:01 PM  Go to [email or web address] -  for contact info  [facility] - Office  [phone]

## 2021-01-13 NOTE — Plan of Care (Signed)
  Problem: Education: Goal: Knowledge of General Education information will improve Description: Including pain rating scale, medication(s)/side effects and non-pharmacologic comfort measures Outcome: Progressing   Problem: Health Behavior/Discharge Planning: Goal: Ability to manage health-related needs will improve Outcome: Progressing   Problem: Clinical Measurements: Goal: Ability to maintain clinical measurements within normal limits will improve Outcome: Progressing Goal: Will remain free from infection Outcome: Progressing Goal: Diagnostic test results will improve Outcome: Progressing Goal: Respiratory complications will improve Outcome: Progressing Goal: Cardiovascular complication will be avoided Outcome: Progressing   Problem: Activity: Goal: Risk for activity intolerance will decrease Outcome: Progressing   Problem: Elimination: Goal: Will not experience complications related to bowel motility Outcome: Progressing   Problem: Safety: Goal: Ability to remain free from injury will improve Outcome: Progressing   Problem: Skin Integrity: Goal: Risk for impaired skin integrity will decrease Outcome: Progressing

## 2021-01-13 NOTE — Progress Notes (Signed)
   01/13/21 0800  Orthostatic Lying   BP- Lying 103/64  Pulse- Lying 53  Orthostatic Standing at 0 minutes  BP- Standing at 0 minutes 93/63  Pulse- Standing at 0 minutes 72  Orthostatic Standing at 3 minutes  BP- Standing at 3 minutes 94/56  Pulse- Standing at 3 minutes 54

## 2021-01-16 ENCOUNTER — Telehealth: Payer: Self-pay

## 2021-01-16 NOTE — Telephone Encounter (Signed)
Per Loree Fee, this patient must be followed by Dr Dorris Fetch due to current DX at [facility]

## 2021-01-18 LAB — ECHOCARDIOGRAM COMPLETE
AR max vel: 1.79 cm2
AV Area VTI: 2.01 cm2
AV Area mean vel: 1.75 cm2
AV Mean grad: 9 mmHg
AV Peak grad: 16.5 mmHg
Ao pk vel: 2.03 m/s
Area-P 1/2: 3.34 cm2
Calc EF: 50 %
Height: 72 in
MV VTI: 4.66 cm2
P 1/2 time: 669 msec
S' Lateral: 2.55 cm
Single Plane A2C EF: 51 %
Single Plane A4C EF: 54.6 %
Weight: 4000 oz

## 2021-01-20 ENCOUNTER — Encounter: Payer: Self-pay | Admitting: "Endocrinology

## 2021-01-20 ENCOUNTER — Ambulatory Visit: Payer: Medicare Other | Admitting: "Endocrinology

## 2021-01-20 ENCOUNTER — Other Ambulatory Visit: Payer: Self-pay

## 2021-01-20 VITALS — Ht 72.0 in | Wt 250.4 lb

## 2021-01-20 DIAGNOSIS — E061 Subacute thyroiditis: Secondary | ICD-10-CM

## 2021-01-20 DIAGNOSIS — E2740 Unspecified adrenocortical insufficiency: Secondary | ICD-10-CM | POA: Diagnosis not present

## 2021-01-20 MED ORDER — DEXAMETHASONE 1 MG PO TABS: 1.0000 mg | ORAL_TABLET | Freq: Two times a day (BID) | ORAL | 2 refills | Status: DC

## 2021-01-20 NOTE — Patient Instructions (Signed)
Cut your water intake by half, continue gatorade 1-2 bottles a day

## 2021-01-20 NOTE — Progress Notes (Signed)
Endocrinology Consult Note                                            01/20/2021, 3:24 PM   Subjective:    Patient ID: Roy [name], male    DOB: October 17, 1950, PCP Bridget Hartshorn, NP   Past Medical History:  Diagnosis Date   Atrial fibrillation ([place])    GERD (gastroesophageal reflux disease)    High cholesterol    Hypertension    Obesity    Past Surgical History:  Procedure Laterality Date   APPENDECTOMY     CATARACT EXTRACTION Bilateral 2022   CHOLECYSTECTOMY     ESOPHAGEAL DILATION     multiple times   JOINT REPLACEMENT Left    hip   Social History   Socioeconomic History   Marital status: Widowed    Spouse name: Not on file   Number of children: 3   Years of education: Not on file   Highest education level: Not on file  Occupational History   Occupation: Retired  Tobacco Use   Smoking status: Never   Smokeless tobacco: Never  Vaping Use   Vaping Use: Never used  Substance and Sexual Activity   Alcohol use: No   Drug use: No   Sexual activity: Not Currently  Other Topics Concern   Not on file  Social History Narrative   Not on file   Social Determinants of Health   Financial Resource Strain: Low Risk    Difficulty of Paying Living Expenses: Not hard at all  Food Insecurity: No Food Insecurity   Worried About Charity fundraiser in the Last Year: Never true   [place] in the Last Year: Never true  Transportation Needs: No Transportation Needs   Lack of Transportation (Medical): No   Lack of Transportation (Non-Medical): No  Physical Activity: Sufficiently Active   Days of Exercise per Week: 5 days   Minutes of Exercise per Session: 30 min  Stress: No Stress Concern Present   Feeling of Stress : Not at all  Social Connections: Moderately Isolated   Frequency of Communication with Friends and Family: More than three times a week   Frequency of Social Gatherings with Friends and Family: Three times a week   Attends Religious  Services: 1 to 4 times per year   Active Member of Clubs or Organizations: No   Attends Archivist Meetings: Never   Marital Status: Widowed   Family History  Problem Relation Age of Onset   Heart failure Father    Heart attack Father        Deceased    Thyroid disease Sister    Outpatient Encounter Medications as of 01/20/2021  Medication Sig   [DISCONTINUED] dexamethasone (DECADRON) 1 MG tablet Take 1 tablet (1 mg total) by mouth 2 (two) times daily with a meal.   acetaminophen (TYLENOL) 325 MG tablet Take 2 tablets (650 mg total) by mouth every 6 (six) hours as needed for headache.   Calcium Carbonate-Vitamin D (CALCIUM-VITAMIN D) 600-125 MG-UNIT TABS Take by mouth.   Cholecalciferol (VITAMIN D3) 25 MCG (1000 UT) CAPS Take by mouth daily.   dexamethasone (DECADRON) 1 MG tablet Take 1 tablet (1 mg total) by mouth 2 (two) times daily with a meal.   magnesium oxide (MAG-OX) 400 (240 Mg) MG tablet  Take 1 tablet (400 mg total) by mouth 2 (two) times daily.   midodrine (PROAMATINE) 5 MG tablet Take 1 tablet (5 mg total) by mouth 3 (three) times daily with meals.   omeprazole (PRILOSEC) 10 MG capsule Take 1 capsule by mouth daily.   potassium chloride (KLOR-CON) 10 MEQ tablet Take 10 mEq by mouth 2 (two) times daily.   rosuvastatin (CRESTOR) 5 MG tablet Take 1 tablet by mouth at bedtime.   warfarin (COUMADIN) 5 MG tablet Take 1 tablet (5 mg total) by mouth every evening.   No facility-administered encounter medications on file as of 01/20/2021.   ALLERGIES: Allergies  Allergen Reactions   Adhesive [Tape]    Sulfa Antibiotics     UNKNOWN Childhood reaction    VACCINATION STATUS: Immunization History  Administered Date(s) Administered   Influenza,inj,Quad PF,6+ Mos 04/28/2015   Moderna Sars-Covid-2 Vaccination 07/03/2019, 07/31/2019   PFIZER(Purple Top)SARS-COV-2 Vaccination 03/25/2020   Pneumococcal Polysaccharide-23 04/28/2015    HPI Roy [name] is 70 y.o. male  who presents today with a medical history as above. he is being seen in consultation for adrenal insufficiency requested by Bridget Hartshorn, NP.  This patient was previously seen in this clinic by my colleague Whitney for transient thyrotoxicosis from subacute thyroiditis.  Is returning for a separate consult.  Recently he was admitted in [facility] for dizziness.  He was found to have hyponatremia, hypotension and subsequent blood work showed hypercortisolemia.  He was given a few doses of hydrocortisone IV.  ACTH stimulation test, also inappropriate, was performed and showed inadequate hydration response. He was not discharged on steroids, he was discharged on January 16, 2021.  He is currently on midodrine 5 mg 3 times daily. He walks with a cane for disequilibrium, denies any history of falls.  He was found to have mild orthostasis in clinic.  He denies any prior knowledge of adrenal dysfunction.  He denies abdominal injury, infection, nor surgery.  He denies any head injury. His labs also showed low ACTH associated with low cortisol. He reports losing about 15 pounds in the last 2 months unintentionally.  He denies any exposure to heavy dose steroids in the past.  His other medical problems include hyperlipidemia on treatment with Crestor.  He has paroxysmal atrial fibrillation, left anterior fascicular block, currently on anticoagulation using Coumadin 5 mg every evening.    Review of Systems  Constitutional: + Recent weight loss, + fatigue, no subjective hyperthermia, no subjective hypothermia Eyes: no blurry vision, no xerophthalmia ENT: no sore throat, no nodules palpated in throat, no dysphagia/odynophagia, no hoarseness Cardiovascular: no Chest Pain, no Shortness of Breath, no palpitations, no leg swelling Respiratory: no cough, no shortness of breath Gastrointestinal: no Nausea/Vomiting/Diarhhea Musculoskeletal: no muscle/joint aches, walks with a cane for  disequilibrium. Skin: no rashes Neurological: no tremors, no numbness, no tingling, no dizziness Psychiatric: no depression, no anxiety  Objective:    Vitals with BMI 01/20/2021 01/13/2021 01/13/2021  Height '6\' 0"'$  - -  Weight 250 lbs 6 oz - -  BMI 99991111 - -  Systolic - 98 99991111  Diastolic - 49 55  Pulse - 61 70    Ht 6' (1.829 m)   Wt 250 lb 6.4 oz (113.6 kg)   BMI 33.96 kg/m   Wt Readings from Last 3 Encounters:  01/20/21 250 lb 6.4 oz (113.6 kg)  01/08/21 250 lb (113.4 kg)  12/12/20 263 lb (119.3 kg)    Physical Exam  Constitutional:  Body mass  index is 33.96 kg/m.,  not in acute distress, normal state of mind, +  walks with a cane Eyes: PERRLA, EOMI, no exophthalmos ENT: moist mucous membranes, no gross thyromegaly, no gross cervical lymphadenopathy Cardiovascular: normal precordial activity, Regular Rate and Rhythm, no Murmur/Rubs/Gallops Respiratory:  adequate breathing efforts, no gross chest deformity, Clear to auscultation bilaterally Gastrointestinal: abdomen soft, Non -tender, No distension, Bowel Sounds present, no gross organomegaly Musculoskeletal: no gross deformities, strength intact in all four extremities Skin: moist, warm, no rashes Neurological: no tremor with outstretched hands, Deep tendon reflexes normal in bilateral lower extremities.  CMP ( most recent) CMP     Component Value Date/Time   NA 136 01/13/2021 0814   K 3.7 01/13/2021 0814   CL 99 01/13/2021 0814   CO2 28 01/13/2021 0814   GLUCOSE 79 01/13/2021 0814   BUN 17 01/13/2021 0814   CREATININE 0.69 01/13/2021 0814   CREATININE 0.82 11/11/2020 1046   CALCIUM 9.3 01/13/2021 0814   PROT 6.5 01/13/2021 0814   ALBUMIN 3.9 01/13/2021 0814   AST 85 (H) 01/13/2021 0814   AST 74 (H) 11/11/2020 1046   ALT 19 01/13/2021 0814   ALT 21 11/11/2020 1046   ALKPHOS 50 01/13/2021 0814   BILITOT 2.0 (H) 01/13/2021 0814   BILITOT 2.3 (H) 11/11/2020 1046   GFRNONAA >60 01/13/2021 0814   GFRNONAA >60  11/11/2020 1046   GFRAA >60 04/29/2019 1033     Diabetic Labs (most recent): Lab Results  Component Value Date   HGBA1C 5.1 01/08/2021     Lipid Panel ( most recent) Lipid Panel     Component Value Date/Time   CHOL 147 04/29/2015 0426   TRIG 153 (H) 04/29/2015 0426   HDL 19 (L) 04/29/2015 0426   CHOLHDL 7.7 04/29/2015 0426   VLDL 31 04/29/2015 0426   LDLCALC 97 04/29/2015 0426      Lab Results  Component Value Date   TSH 0.036 (L) 01/08/2021   TSH 0.067 (L) 11/09/2020   TSH 0.030 (L) 10/21/2020   TSH 3.836 03/17/2018   TSH 6.505 (H) 04/27/2015   TSH 10.503 (H) 04/27/2015   FREET4 0.74 01/09/2021   FREET4 1.12 11/09/2020   FREET4 1.75 (H) 10/21/2020   FREET4 0.98 03/17/2018   FREET4 0.93 04/28/2015      Assessment & Plan:   1. Adrenal insufficiency ([place]) 2. Subacute thyroiditis    - JESSIE GOETSCHIUS  is being seen at a kind request of Hemberg, Karie Schwalbe, NP. - I have reviewed his available endocrine records and clinically evaluated the patient. - Based on these reviews, he has adrenal insufficiency with significant symptoms which required recent hospitalization.  He did have hypotension, hyponatremia, which has largely resolved after a few dose of hydrocortisone.  Largely resolved after a few doses of hydrocortisone.  -He is in-house ACTH stim test was not appropriate or uninterpretable since it was done after exposure to hydrocortisone.  -He stands to benefit from steroid replacement.  I chose dexamethasone 1 mg p.o. daily to avoid interfering steroids before repeating his ACTH stimulation test. -I have emphasized the absolute need for steroid replacement with him and prescribed dexamethasone 1 mg p.o. daily at breakfast to his pharmacy. -In 3 weeks, he will have a repeat ACTH stimulation test and office visit for reassessment. He is advised on water striction, currently drinking about 4 L of water per day.  He is advised to cut it to 2 L, and allow more  Gatorade/electrolyte liquids.  His  presenting hyponatremia may be multifactorial including SIADH. Regarding his recent subacute thyroiditis with transient thyrotoxicosis, he is not on intervention. He will have thyroid function test repeated along with next labs including TSH, free T4/free T3 and CMP. -He is encouraged to continue his midodrine 5 mg p.o. 3 times daily. -He is advised on fall precaution, encouraged to use his cane for equilibrium.  - I did not initiate any new prescriptions today. - he is advised to maintain close follow up with Hemberg, Karie Schwalbe, NP for primary care needs.   - Time spent with the patient: 60 minutes, of which >50% was spent in  counseling him about his adrenal insufficiency, subacute thyroiditis and the rest in obtaining information about his symptoms, reviewing his previous labs/studies ( including abstractions from other facilities),  evaluations, and treatments,  and developing a plan to confirm diagnosis and long term treatment based on the latest standards of care/guidelines; and documenting his care.  Roy [name] participated in the discussions, expressed understanding, and voiced agreement with the above plans.  All questions were answered to his satisfaction. he is encouraged to contact clinic should he have any questions or concerns prior to his return visit.  Follow up plan: Return in about 4 weeks (around 02/17/2021) for F/U with Pre-visit Labs.   Glade Lloyd, MD [facility] [address] [place], [place] [address] Phone: [phone]  Fax: [phone]     01/20/2021, 3:24 PM  This note was partially dictated with voice recognition software. Similar sounding words can be transcribed inadequately or may not  be corrected upon review.  Dexamethasone

## 2021-02-02 DIAGNOSIS — I872 Venous insufficiency (chronic) (peripheral): Secondary | ICD-10-CM | POA: Insufficient documentation

## 2021-02-02 DIAGNOSIS — I83813 Varicose veins of bilateral lower extremities with pain: Secondary | ICD-10-CM | POA: Insufficient documentation

## 2021-02-17 ENCOUNTER — Ambulatory Visit: Payer: Medicare Other | Admitting: "Endocrinology

## 2021-02-21 ENCOUNTER — Inpatient Hospital Stay (HOSPITAL_COMMUNITY): Payer: Medicare Other | Attending: Hematology

## 2021-02-21 ENCOUNTER — Other Ambulatory Visit: Payer: Self-pay

## 2021-02-21 ENCOUNTER — Other Ambulatory Visit (HOSPITAL_COMMUNITY)
Admission: RE | Admit: 2021-02-21 | Discharge: 2021-02-21 | Disposition: A | Discharge status: Home / Self Care | Payer: Medicare Other | Source: Ambulatory Visit | Attending: "Endocrinology | Admitting: "Endocrinology

## 2021-02-21 ENCOUNTER — Other Ambulatory Visit (HOSPITAL_COMMUNITY): Payer: Self-pay | Admitting: Physician Assistant

## 2021-02-21 DIAGNOSIS — D596 Hemoglobinuria due to hemolysis from other external causes: Secondary | ICD-10-CM | POA: Diagnosis not present

## 2021-02-21 DIAGNOSIS — E061 Subacute thyroiditis: Secondary | ICD-10-CM | POA: Insufficient documentation

## 2021-02-21 DIAGNOSIS — D591 Autoimmune hemolytic anemia, unspecified: Secondary | ICD-10-CM

## 2021-02-21 DIAGNOSIS — E2740 Unspecified adrenocortical insufficiency: Secondary | ICD-10-CM | POA: Diagnosis present

## 2021-02-21 DIAGNOSIS — D696 Thrombocytopenia, unspecified: Secondary | ICD-10-CM

## 2021-02-21 DIAGNOSIS — Z79899 Other long term (current) drug therapy: Secondary | ICD-10-CM | POA: Insufficient documentation

## 2021-02-21 DIAGNOSIS — R7401 Elevation of levels of liver transaminase levels: Secondary | ICD-10-CM | POA: Insufficient documentation

## 2021-02-21 DIAGNOSIS — D72819 Decreased white blood cell count, unspecified: Secondary | ICD-10-CM | POA: Insufficient documentation

## 2021-02-21 DIAGNOSIS — R779 Abnormality of plasma protein, unspecified: Secondary | ICD-10-CM | POA: Insufficient documentation

## 2021-02-21 DIAGNOSIS — Z7901 Long term (current) use of anticoagulants: Secondary | ICD-10-CM | POA: Insufficient documentation

## 2021-02-21 DIAGNOSIS — I480 Paroxysmal atrial fibrillation: Secondary | ICD-10-CM | POA: Insufficient documentation

## 2021-02-21 LAB — COMPREHENSIVE METABOLIC PANEL
ALT: 47 U/L — ABNORMAL HIGH (ref 0–44)
AST: 85 U/L — ABNORMAL HIGH (ref 15–41)
Albumin: 4 g/dL (ref 3.5–5.0)
Alkaline Phosphatase: 69 U/L (ref 38–126)
Anion gap: 8 (ref 5–15)
BUN: 25 mg/dL — ABNORMAL HIGH (ref 8–23)
CO2: 31 mmol/L (ref 22–32)
Calcium: 9.1 mg/dL (ref 8.9–10.3)
Chloride: 94 mmol/L — ABNORMAL LOW (ref 98–111)
Creatinine, Ser: 0.83 mg/dL (ref 0.61–1.24)
GFR, Estimated: >60 mL/min (ref >60)
Glucose, Bld: 84 mg/dL (ref 70–99)
Potassium: 4.1 mmol/L (ref 3.5–5.1)
Sodium: 133 mmol/L — ABNORMAL LOW (ref 135–145)
Total Bilirubin: 1.9 mg/dL — ABNORMAL HIGH (ref 0.3–1.2)
Total Protein: 6.9 g/dL (ref 6.5–8.1)

## 2021-02-21 LAB — TSH: TSH: 0.791 u[IU]/mL (ref 0.350–4.500)

## 2021-02-21 LAB — CBC WITH DIFFERENTIAL/PLATELET
Abs Immature Granulocytes: 0.01 10*3/uL (ref 0.00–0.07)
Basophils Absolute: 0 10*3/uL (ref 0.0–0.1)
Basophils Relative: 1 %
Eosinophils Absolute: 0 10*3/uL (ref 0.0–0.5)
Eosinophils Relative: 2 %
HCT: 48.5 % (ref 39.0–52.0)
Hemoglobin: 15.6 g/dL (ref 13.0–17.0)
Immature Granulocytes: 1 %
Lymphocytes Relative: 21 %
Lymphs Abs: 0.4 10*3/uL — ABNORMAL LOW (ref 0.7–4.0)
MCH: 30.9 pg (ref 26.0–34.0)
MCHC: 32.2 g/dL (ref 30.0–36.0)
MCV: 96 fL (ref 80.0–100.0)
Monocytes Absolute: 0.2 10*3/uL (ref 0.1–1.0)
Monocytes Relative: 9 %
Neutro Abs: 1.4 10*3/uL — ABNORMAL LOW (ref 1.7–7.7)
Neutrophils Relative %: 66 %
Platelets: 51 10*3/uL — ABNORMAL LOW (ref 150–400)
RBC: 5.05 MIL/uL (ref 4.22–5.81)
RDW: 14.3 % (ref 11.5–15.5)
WBC: 2.1 10*3/uL — ABNORMAL LOW (ref 4.0–10.5)
nRBC: 0 % (ref 0.0–0.2)

## 2021-02-21 LAB — IRON AND TIBC
Iron: 69 ug/dL (ref 45–182)
Saturation Ratios: 17 % — ABNORMAL LOW (ref 17.9–39.5)
TIBC: 416 ug/dL (ref 250–450)
UIBC: 347 ug/dL

## 2021-02-21 LAB — LACTATE DEHYDROGENASE: LDH: 570 U/L — ABNORMAL HIGH (ref 98–192)

## 2021-02-21 LAB — T4, FREE: Free T4: 0.31 ng/dL — ABNORMAL LOW (ref 0.61–1.12)

## 2021-02-21 LAB — VITAMIN D 25 HYDROXY (VIT D DEFICIENCY, FRACTURES): Vit D, 25-Hydroxy: 24.22 ng/mL — ABNORMAL LOW (ref 30–100)

## 2021-02-21 LAB — FERRITIN: Ferritin: 152 ng/mL (ref 24–336)

## 2021-02-21 NOTE — Progress Notes (Signed)
Will do. Thanks.

## 2021-02-22 LAB — T3, FREE: T3, Free: 0.8 pg/mL — ABNORMAL LOW (ref 2.0–4.4)

## 2021-02-22 LAB — KAPPA/LAMBDA LIGHT CHAINS
Kappa free light chain: 78.6 mg/L — ABNORMAL HIGH (ref 3.3–19.4)
Kappa, lambda light chain ratio: 10.77 — ABNORMAL HIGH (ref 0.26–1.65)
Lambda free light chains: 7.3 mg/L (ref 5.7–26.3)

## 2021-02-23 ENCOUNTER — Encounter (HOSPITAL_COMMUNITY)
Admission: RE | Admit: 2021-02-23 | Discharge: 2021-02-23 | Disposition: A | Discharge status: Home / Self Care | Payer: Medicare Other | Source: Ambulatory Visit | Attending: "Endocrinology | Admitting: "Endocrinology

## 2021-02-23 ENCOUNTER — Other Ambulatory Visit: Payer: Self-pay

## 2021-02-23 VITALS — BP 163/86 | HR 82 | Temp 97.9°F | Resp 18

## 2021-02-23 DIAGNOSIS — E876 Hypokalemia: Secondary | ICD-10-CM | POA: Insufficient documentation

## 2021-02-23 DIAGNOSIS — I4510 Unspecified right bundle-branch block: Secondary | ICD-10-CM

## 2021-02-23 DIAGNOSIS — R531 Weakness: Secondary | ICD-10-CM | POA: Diagnosis not present

## 2021-02-23 DIAGNOSIS — E669 Obesity, unspecified: Secondary | ICD-10-CM | POA: Diagnosis not present

## 2021-02-23 DIAGNOSIS — E0590 Thyrotoxicosis, unspecified without thyrotoxic crisis or storm: Secondary | ICD-10-CM

## 2021-02-23 DIAGNOSIS — R42 Dizziness and giddiness: Secondary | ICD-10-CM

## 2021-02-23 DIAGNOSIS — I440 Atrioventricular block, first degree: Secondary | ICD-10-CM

## 2021-02-23 DIAGNOSIS — I444 Left anterior fascicular block: Secondary | ICD-10-CM

## 2021-02-23 DIAGNOSIS — D689 Coagulation defect, unspecified: Secondary | ICD-10-CM | POA: Insufficient documentation

## 2021-02-23 DIAGNOSIS — I453 Trifascicular block: Secondary | ICD-10-CM | POA: Diagnosis not present

## 2021-02-23 DIAGNOSIS — E061 Subacute thyroiditis: Secondary | ICD-10-CM | POA: Insufficient documentation

## 2021-02-23 DIAGNOSIS — Z7901 Long term (current) use of anticoagulants: Secondary | ICD-10-CM | POA: Insufficient documentation

## 2021-02-23 DIAGNOSIS — E2740 Unspecified adrenocortical insufficiency: Secondary | ICD-10-CM

## 2021-02-23 DIAGNOSIS — I480 Paroxysmal atrial fibrillation: Secondary | ICD-10-CM | POA: Diagnosis not present

## 2021-02-23 DIAGNOSIS — I10 Essential (primary) hypertension: Secondary | ICD-10-CM

## 2021-02-23 DIAGNOSIS — E871 Hypo-osmolality and hyponatremia: Secondary | ICD-10-CM | POA: Insufficient documentation

## 2021-02-23 LAB — COMPREHENSIVE METABOLIC PANEL
ALT: 43 U/L (ref 0–44)
AST: 77 U/L — ABNORMAL HIGH (ref 15–41)
Albumin: 3.7 g/dL (ref 3.5–5.0)
Alkaline Phosphatase: 70 U/L (ref 38–126)
Anion gap: 8 (ref 5–15)
BUN: 19 mg/dL (ref 8–23)
CO2: 32 mmol/L (ref 22–32)
Calcium: 9.3 mg/dL (ref 8.9–10.3)
Chloride: 97 mmol/L — ABNORMAL LOW (ref 98–111)
Creatinine, Ser: 0.66 mg/dL (ref 0.61–1.24)
GFR, Estimated: >60 mL/min (ref >60)
Glucose, Bld: 76 mg/dL (ref 70–99)
Potassium: 4.3 mmol/L (ref 3.5–5.1)
Sodium: 137 mmol/L (ref 135–145)
Total Bilirubin: 1.4 mg/dL — ABNORMAL HIGH (ref 0.3–1.2)
Total Protein: 6.4 g/dL — ABNORMAL LOW (ref 6.5–8.1)

## 2021-02-23 LAB — TSH: TSH: 0.77 u[IU]/mL (ref 0.350–4.500)

## 2021-02-23 LAB — ACTH STIMULATION, 3 TIME POINTS
Cortisol, 30 Min: 1.2 ug/dL
Cortisol, 60 Min: 1.8 ug/dL
Cortisol, Base: <0.4 ug/dL

## 2021-02-23 MED ORDER — COSYNTROPIN 0.25 MG IJ SOLR
0.2500 mg | Freq: Once | INTRAMUSCULAR | Status: AC
  Administered 2021-02-23: 0.25 mg via INTRAVENOUS

## 2021-02-24 LAB — PROTEIN ELECTROPHORESIS, SERUM
A/G Ratio: 1.2 (ref 0.7–1.7)
Albumin ELP: 3.5 g/dL (ref 2.9–4.4)
Alpha-1-Globulin: 0.3 g/dL (ref 0.0–0.4)
Alpha-2-Globulin: 0.8 g/dL (ref 0.4–1.0)
Beta Globulin: 1 g/dL (ref 0.7–1.3)
Gamma Globulin: 0.8 g/dL (ref 0.4–1.8)
Globulin, Total: 2.9 g/dL (ref 2.2–3.9)
Total Protein ELP: 6.4 g/dL (ref 6.0–8.5)

## 2021-02-24 LAB — T3: T3, Total: 30 ng/dL — ABNORMAL LOW (ref 71–180)

## 2021-02-24 LAB — T4: T4, Total: 1.8 ug/dL — ABNORMAL LOW (ref 4.5–12.0)

## 2021-02-27 ENCOUNTER — Other Ambulatory Visit: Payer: Self-pay

## 2021-02-27 ENCOUNTER — Inpatient Hospital Stay (HOSPITAL_COMMUNITY): Payer: Medicare Other

## 2021-02-27 DIAGNOSIS — Z79899 Other long term (current) drug therapy: Secondary | ICD-10-CM | POA: Diagnosis not present

## 2021-02-27 DIAGNOSIS — D72819 Decreased white blood cell count, unspecified: Secondary | ICD-10-CM | POA: Diagnosis present

## 2021-02-27 DIAGNOSIS — Z7901 Long term (current) use of anticoagulants: Secondary | ICD-10-CM | POA: Diagnosis not present

## 2021-02-27 DIAGNOSIS — D696 Thrombocytopenia, unspecified: Secondary | ICD-10-CM

## 2021-02-27 DIAGNOSIS — R779 Abnormality of plasma protein, unspecified: Secondary | ICD-10-CM | POA: Diagnosis not present

## 2021-02-27 DIAGNOSIS — R7401 Elevation of levels of liver transaminase levels: Secondary | ICD-10-CM | POA: Diagnosis not present

## 2021-02-27 DIAGNOSIS — I480 Paroxysmal atrial fibrillation: Secondary | ICD-10-CM | POA: Diagnosis not present

## 2021-02-27 LAB — CBC WITH DIFFERENTIAL/PLATELET
Abs Immature Granulocytes: 0.03 10*3/uL (ref 0.00–0.07)
Basophils Absolute: 0 10*3/uL (ref 0.0–0.1)
Basophils Relative: 0 %
Eosinophils Absolute: 0 10*3/uL (ref 0.0–0.5)
Eosinophils Relative: 0 %
HCT: 47 % (ref 39.0–52.0)
Hemoglobin: 15.2 g/dL (ref 13.0–17.0)
Immature Granulocytes: 1 %
Lymphocytes Relative: 31 %
Lymphs Abs: 0.7 10*3/uL (ref 0.7–4.0)
MCH: 30.9 pg (ref 26.0–34.0)
MCHC: 32.3 g/dL (ref 30.0–36.0)
MCV: 95.5 fL (ref 80.0–100.0)
Monocytes Absolute: 0.2 10*3/uL (ref 0.1–1.0)
Monocytes Relative: 7 %
Neutro Abs: 1.4 10*3/uL — ABNORMAL LOW (ref 1.7–7.7)
Neutrophils Relative %: 61 %
Platelets: 64 10*3/uL — ABNORMAL LOW (ref 150–400)
RBC: 4.92 MIL/uL (ref 4.22–5.81)
RDW: 14.5 % (ref 11.5–15.5)
WBC: 2.3 10*3/uL — ABNORMAL LOW (ref 4.0–10.5)
nRBC: 1.3 % — ABNORMAL HIGH (ref 0.0–0.2)

## 2021-02-27 LAB — 25-HYDROXY VITAMIN D LCMS D2+D3
25-Hydroxy, Vitamin D-2: <1 ng/mL
25-Hydroxy, Vitamin D-3: 31 ng/mL
25-Hydroxy, Vitamin D: 31 ng/mL

## 2021-02-27 LAB — FOLATE: Folate: 8.8 ng/mL (ref >5.9)

## 2021-02-27 LAB — VITAMIN B12: Vitamin B-12: 217 pg/mL (ref 180–914)

## 2021-02-27 NOTE — Progress Notes (Signed)
[place], [place] [address]   CLINIC:  Medical Oncology/Hematology  PCP:  Bridget Hartshorn, NP [place] [address] [place] [address] [phone]   REASON FOR VISIT:  Follow-up for hemolysis without anemia  PRIOR THERAPY: None  CURRENT THERAPY: Observation  INTERVAL HISTORY:  Roy [name] 70 y.o. male returns for routine follow-up of hemolysis without anemia.  He was last seen by Dede Query PA-C on 11/28/2020.  At today's visit, he reports feeling fairly well.   He was hospitalized last month (01/08/2021 through 01/13/2021) due to adrenal insufficiency, hyponatremia, and symptomatic bradycardia.  His most recent labs of showed new onset thrombocytopenia and leukopenia (as of 02/21/2021).  He denied any new infections, antibiotics, or medications.  He did note that he bled more easily with his most recent blood draw, but otherwise denies any abnormal bleeding such as hematemesis, hematochezia, melena, or epistaxis.  He denies any abnormal bruising or petechial rash.  No B symptoms such as fever, chills, night sweats, unintentional weight loss.  No new lumps or bumps.  He denies any new bone pain or new onset neurologic symptoms, but does have some chronic numbness in his hands and feet.  He has 100% energy and 100% appetite. He has been trying to lose weight ever since his hospital stay in June, he has been walking more and eating healthier.  He has lost about 30 pounds since June 2022.   REVIEW OF SYSTEMS:  Review of Systems  Constitutional:  Negative for appetite change, chills, diaphoresis, fatigue, fever and unexpected weight change.  HENT:   Negative for lump/mass and nosebleeds.   Eyes:  Negative for eye problems.  Respiratory:  Negative for cough, hemoptysis and shortness of breath.   Cardiovascular:  Negative for chest pain, leg swelling and palpitations.  Gastrointestinal:  Negative for abdominal pain, blood in stool,  constipation, diarrhea, nausea and vomiting.  Genitourinary:  Negative for hematuria.   Skin: Negative.   Neurological:  Positive for numbness (chronic neuropathy). Negative for dizziness, headaches and light-headedness.  Hematological:  Does not bruise/bleed easily.     PAST MEDICAL/SURGICAL HISTORY:  Past Medical History:  Diagnosis Date   Atrial fibrillation ([facility])    GERD (gastroesophageal reflux disease)    High cholesterol    Hypertension    Obesity    Past Surgical History:  Procedure Laterality Date   APPENDECTOMY     CATARACT EXTRACTION Bilateral 2022   CHOLECYSTECTOMY     ESOPHAGEAL DILATION     multiple times   JOINT REPLACEMENT Left    hip     SOCIAL HISTORY:  Social History   Socioeconomic History   Marital status: Widowed    Spouse name: Not on file   Number of children: 3   Years of education: Not on file   Highest education level: Not on file  Occupational History   Occupation: Retired  Tobacco Use   Smoking status: Never   Smokeless tobacco: Never  Vaping Use   Vaping Use: Never used  Substance and Sexual Activity   Alcohol use: No   Drug use: No   Sexual activity: Not Currently  Other Topics Concern   Not on file  Social History Narrative   Not on file   Social Determinants of Health   Financial Resource Strain: Low Risk    Difficulty of Paying Living Expenses: Not hard at all  Food Insecurity: No Food Insecurity   Worried About Running  Out of Food in the Last Year: Never true   Ran Out of Food in the Last Year: Never true  Transportation Needs: No Transportation Needs   Lack of Transportation (Medical): No   Lack of Transportation (Non-Medical): No  Physical Activity: Sufficiently Active   Days of Exercise per Week: 5 days   Minutes of Exercise per Session: 30 min  Stress: No Stress Concern Present   Feeling of Stress : Not at all  Social Connections: Moderately Isolated   Frequency of Communication with Friends and Family: More  than three times a week   Frequency of Social Gatherings with Friends and Family: Three times a week   Attends Religious Services: 1 to 4 times per year   Active Member of Clubs or Organizations: No   Attends Club or Organization Meetings: Never   Marital Status: Widowed  Intimate Partner Violence: Not At Risk   Fear of Current or Ex-Partner: No   Emotionally Abused: No   Physically Abused: No   Sexually Abused: No    FAMILY HISTORY:  Family History  Problem Relation Age of Onset   Heart failure Father    Heart attack Father        Deceased    Thyroid disease Sister     CURRENT MEDICATIONS:  Outpatient Encounter Medications as of 02/28/2021  Medication Sig   acetaminophen (TYLENOL) 325 MG tablet Take 2 tablets (650 mg total) by mouth every 6 (six) hours as needed for headache.   Calcium Carbonate-Vitamin D (CALCIUM-VITAMIN D) 600-125 MG-UNIT TABS Take by mouth.   Cholecalciferol (VITAMIN D3) 25 MCG (1000 UT) CAPS Take by mouth daily.   dexamethasone (DECADRON) 1 MG tablet Take 1 tablet (1 mg total) by mouth 2 (two) times daily with a meal.   magnesium oxide (MAG-OX) 400 (240 Mg) MG tablet Take 1 tablet (400 mg total) by mouth 2 (two) times daily.   midodrine (PROAMATINE) 5 MG tablet Take 1 tablet (5 mg total) by mouth 3 (three) times daily with meals.   omeprazole (PRILOSEC) 10 MG capsule Take 1 capsule by mouth daily.   potassium chloride (KLOR-CON) 10 MEQ tablet Take 10 mEq by mouth 2 (two) times daily.   rosuvastatin (CRESTOR) 5 MG tablet Take 1 tablet by mouth at bedtime.   warfarin (COUMADIN) 5 MG tablet Take 1 tablet (5 mg total) by mouth every evening.   No facility-administered encounter medications on file as of 02/28/2021.    ALLERGIES:  Allergies  Allergen Reactions   Adhesive [Tape]    Sulfa Antibiotics     UNKNOWN Childhood reaction     PHYSICAL EXAM:  ECOG PERFORMANCE STATUS: 0 - Asymptomatic  There were no vitals filed for this visit. There were no  vitals filed for this visit. Physical Exam Constitutional:      Appearance: Normal appearance. He is morbidly obese.  HENT:     Head: Normocephalic and atraumatic.     Mouth/Throat:     Mouth: Mucous membranes are moist.  Eyes:     Extraocular Movements: Extraocular movements intact.     Pupils: Pupils are equal, round, and reactive to light.  Cardiovascular:     Rate and Rhythm: Normal rate and regular rhythm.     Pulses: Normal pulses.     Heart sounds: Normal heart sounds.  Pulmonary:     Effort: Pulmonary effort is normal.     Breath sounds: Normal breath sounds.  Abdominal:     General: Bowel sounds are normal.       Palpations: Abdomen is soft.     Tenderness: There is no abdominal tenderness.  Musculoskeletal:        General: No swelling.     Right lower leg: No edema.     Left lower leg: No edema.  Lymphadenopathy:     Cervical: No cervical adenopathy.  Skin:    General: Skin is warm and dry.  Neurological:     General: No focal deficit present.     Mental Status: He is alert and oriented to person, place, and time.  Psychiatric:        Mood and Affect: Mood normal.        Behavior: Behavior normal.     LABORATORY DATA:  I have reviewed the labs as listed.  CBC    Component Value Date/Time   WBC 2.3 (L) 02/27/2021 1014   RBC 4.92 02/27/2021 1014   HGB 15.2 02/27/2021 1014   HCT 47.0 02/27/2021 1014   PLT 64 (L) 02/27/2021 1014   MCV 95.5 02/27/2021 1014   MCH 30.9 02/27/2021 1014   MCHC 32.3 02/27/2021 1014   RDW 14.5 02/27/2021 1014   LYMPHSABS 0.7 02/27/2021 1014   MONOABS 0.2 02/27/2021 1014   EOSABS 0.0 02/27/2021 1014   BASOSABS 0.0 02/27/2021 1014   CMP Latest Ref Rng & Units 02/23/2021 02/21/2021 01/13/2021  Glucose 70 - 99 mg/dL 76 84 79  BUN 8 - 23 mg/dL 19 25(H) 17  Creatinine 0.61 - 1.24 mg/dL 0.66 0.83 0.69  Sodium 135 - 145 mmol/L 137 133(L) 136  Potassium 3.5 - 5.1 mmol/L 4.3 4.1 3.7  Chloride 98 - 111 mmol/L 97(L) 94(L) 99  CO2 22 -  32 mmol/L 32 31 28  Calcium 8.9 - 10.3 mg/dL 9.3 9.1 9.3  Total Protein 6.5 - 8.1 g/dL 6.4(L) 6.9 6.5  Total Bilirubin 0.3 - 1.2 mg/dL 1.4(H) 1.9(H) 2.0(H)  Alkaline Phos 38 - 126 U/L 70 69 50  AST 15 - 41 U/L 77(H) 85(H) 85(H)  ALT 0 - 44 U/L 43 47(H) 19    DIAGNOSTIC IMAGING:  I have independently reviewed the relevant imaging and discussed with the patient.  ASSESSMENT & PLAN: 1.  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 Chryl Heck (11/11/2020) due to concern for hemolysis (without anemia) on labs with elevated indirect bilirubin, elevated LDH, low haptoglobin, negative Coombs test, and mildly elevated reticulocyte count - CT abdomen/pelvis (10/21/2020): Normal liver s/p cholecystectomy, normal-appearing spleen - No new medications, herbal supplements, or vitamins associated with hemolysis. - No foreign devices in body that could cause fragmentation. - No evidence of inheritable/genetic causes - No concern for active infection.  Hepatitis panel negative. - ANA negative.  Cryoglobulin negative. - Paroxysmal nocturnal hemoglobinuria profile was negative - Patient was referred to gastroenterology to rule out liver disease in the setting of elevated AST, hyperbilirubinemia, and history of diffuse fatty infiltration seen on prior imaging (US abdomen 2006), however patient declined to follow-up with GI  - Reviewed most recent labs (02/21/2021), which showed persistently elevated LDH 570, with total bilirubin 1.9 and transaminitis (AST 85 / ALT 47) - MGUS panel showed elevated free light chain ratio 10.77 (see below) - CBC (02/27/2021): Hgb 15.2, WBC 2.3, platelets 64 - PLAN: We will recheck DAT/Coombs today. - Due to unexplained hemolysis with new onset thrombocytopenia and leukopenia, we will refer patient for bone marrow biopsy to rule out any underlying MDS or other bone marrow disorder. - Continue to encourage patient to follow-up with gastroenterology. -  No  indication for transfusion or treatment due to mild hemolysis without anemia.  2.  Thrombocytopenia & leukopenia - Leukopenia and thrombocytopenia first noted on 02/21/2021 - Most recent CBC (02/27/2021): WBC 2.3 (ANC 1.4), platelets 64 - Borderline B12 at 217 (methylmalonic acid pending) - Normal folate 8.8 - No splenomegaly on recent CT abdomen/pelvis (June 2022) - Hepatitis panel was negative - Copper and pathologist smear review pending - PLAN: Discussed with Dr. Delton Coombes, and due to unexplained thrombocytopenia/leukopenia as well as unexplained hemolysis and high free light chain ratio, we will refer patient for bone marrow biopsy. - Start B12 supplement (1000 mcg daily) due to marginal B12  3.  Abnormal free light chains - MGUS panel (02/21/2021):  SPEP negative for M spike, but elevated kappa light chain 78.6 with lambda 7.3 and elevated free light chain ratio 10.77 - No CRAB criteria at this time with Hgb 15.2, creatinine 0.66, and calcium 9.3 - New bone pain or B symptoms  - PLAN: We will check immunofixation, 24-hour urine with UPEP/IFE, skeletal survey, beta-2 microglobulin - Discussed with Dr. Delton Coombes, and we will proceed with bone marrow biopsy due to elevated free light chain ratio as well as other hematologic abnormalities noted above. - Patient will RTC in 3 to 4 weeks to discuss results of myeloma work-up. - He will RTC after bone marrow biopsy to discuss results with Dr. Delton Coombes.  4.  Mild iron deficiency state, without anemia - Iron panel (02/21/2021):  Ferritin 152, iron saturation 17% with TIBC 416 - He denies any abnormal bleeding - PLAN: Patient recommended to start taking ferrous sulfate 325 mg daily with food and stool softener if needed.  We will recheck iron panel in 3 months (January 2023)  5.  Other history - PMH: Paroxysmal atrial fibrillation on Coumadin, adrenal insufficiency, hyponatremia, hypertension, hyperlipidemia   PLAN SUMMARY &  DISPOSITION: - Labs today (DAT/Coombs, immunofixation, beta-2 microglobulin) - Check 24-hour urine with UPEP/IFE - Check skeletal survey - Referral for CT-guided bone marrow biopsy - Repeat labs in 3 weeks (same day as follow-up with Roy [name]) - CBC - Office visit with Vernie Piet in 3 weeks to discuss results of MGUS/myeloma panel. - Office visit with Dr. Delton Coombes after bone marrow biopsy. - Medication changes: Start taking vitamin B12 and ferrous sulfate supplements daily - Referral to gastroenterology due to transaminitis (elevated liver enzymes)  All questions were answered. The patient knows to call the clinic with any problems, questions or concerns.  Medical decision making: Moderate  Time spent on visit: I spent 25 minutes counseling the patient face to face. The total time spent in the appointment was 40 minutes and more than 50% was on counseling.   Harriett Rush, PA-C  02/28/2021 12:08 PM

## 2021-02-28 ENCOUNTER — Ambulatory Visit (HOSPITAL_COMMUNITY)
Admission: RE | Admit: 2021-02-28 | Discharge: 2021-02-28 | Disposition: A | Discharge status: Home / Self Care | Payer: Medicare Other | Source: Ambulatory Visit | Attending: Physician Assistant | Admitting: Physician Assistant

## 2021-02-28 ENCOUNTER — Inpatient Hospital Stay (HOSPITAL_COMMUNITY): Payer: Medicare Other | Admitting: Physician Assistant

## 2021-02-28 ENCOUNTER — Inpatient Hospital Stay (HOSPITAL_COMMUNITY): Payer: Medicare Other

## 2021-02-28 ENCOUNTER — Other Ambulatory Visit: Payer: Self-pay | Admitting: "Endocrinology

## 2021-02-28 ENCOUNTER — Other Ambulatory Visit (HOSPITAL_COMMUNITY): Payer: Self-pay

## 2021-02-28 ENCOUNTER — Other Ambulatory Visit (HOSPITAL_COMMUNITY): Payer: Self-pay | Admitting: Physician Assistant

## 2021-02-28 VITALS — BP 151/88 | HR 64 | Temp 97.9°F | Resp 16 | Wt 229.7 lb

## 2021-02-28 DIAGNOSIS — D696 Thrombocytopenia, unspecified: Secondary | ICD-10-CM | POA: Diagnosis not present

## 2021-02-28 DIAGNOSIS — K760 Fatty (change of) liver, not elsewhere classified: Secondary | ICD-10-CM

## 2021-02-28 DIAGNOSIS — D472 Monoclonal gammopathy: Secondary | ICD-10-CM | POA: Insufficient documentation

## 2021-02-28 DIAGNOSIS — D72819 Decreased white blood cell count, unspecified: Secondary | ICD-10-CM

## 2021-02-28 DIAGNOSIS — E538 Deficiency of other specified B group vitamins: Secondary | ICD-10-CM

## 2021-02-28 DIAGNOSIS — E611 Iron deficiency: Secondary | ICD-10-CM

## 2021-02-28 DIAGNOSIS — Z862 Personal history of diseases of the blood and blood-forming organs and certain disorders involving the immune mechanism: Secondary | ICD-10-CM

## 2021-02-28 LAB — DIRECT ANTIGLOBULIN TEST (NOT AT ARMC)
DAT, IgG: NEGATIVE
DAT, complement: NEGATIVE

## 2021-02-28 LAB — PATHOLOGIST SMEAR REVIEW

## 2021-02-28 MED ORDER — FERROUS SULFATE 325 (65 FE) MG PO TBEC: 325.0000 mg | DELAYED_RELEASE_TABLET | Freq: Every day | ORAL | 3 refills | Status: DC

## 2021-02-28 MED ORDER — VITAMIN B-12 1000 MCG PO TABS: 1000.0000 ug | ORAL_TABLET | Freq: Every day | ORAL | 3 refills | Status: AC

## 2021-02-28 NOTE — Telephone Encounter (Signed)
Are you wanting to continue this medication?

## 2021-02-28 NOTE — Patient Instructions (Signed)
[facility] at [facility] Discharge Instructions  You were seen today by Tarri Abernethy PA-C for your abnormal blood levels.  As we discussed, you have multiple unexplained abnormalities on your most recent lab work. - Low platelets - Low white blood cells - Elevated levels of certain proteins ("abnormal free light chains") - Signs of blood cells that may be breaking apart ("hemolysis")  We do not yet know the cause of these abnormalities, so in order to get a better understanding of what is going on with your blood, we would like to obtain a BONE MARROW BIOPSY.  This will be scheduled within the next 1 to 2 months and we will discuss the results with you afterwards.  We will also check some additional labs on you today, as well as some x-rays of your body.  We will also check a 24-hour urine sample.  Due to your abnormal liver tests, we recommend that you follow-up with a stomach/liver doctor ("gastroenterologist").  We also found that you had mildly decreased levels of B12 and iron.  We would like you to start taking daily B12 (cyanocobalamin 1000 mcg) and iron (ferrous sulfate 325 mg).  LABS: - Labs today before leaving the hospital (on the first floor). - Repeat labs in 4 weeks.  OTHER TESTS: - 24-hour urine study - Whole-body x-rays - Bone marrow biopsy  MEDICATIONS: - START taking B12 (cyanocobalamin) 1000 mcg daily - START taking ferrous sulfate 325 mg (iron) daily  FOLLOW-UP APPOINTMENT: Office visit in 4 weeks to discuss results.   Thank you for choosing [place] at [facility] to provide your oncology and hematology care.  To afford each patient quality time with our provider, please arrive at least 15 minutes before your scheduled appointment time.   If you have a lab appointment with the [facility] please come in thru the Main Entrance and check in at the main information desk.  You need to re-schedule your  appointment should you arrive 10 or more minutes late.  We strive to give you quality time with our providers, and arriving late affects you and other patients whose appointments are after yours.  Also, if you no show three or more times for appointments you may be dismissed from the clinic at the providers discretion.     Again, thank you for choosing [facility].  Our hope is that these requests will decrease the amount of time that you wait before being seen by our physicians.       _____________________________________________________________  Should you have questions after your visit to [facility], please contact our office at [phone] and follow the prompts.  Our office hours are 8:00 a.m. and 4:30 p.m. Monday - Friday.  Please note that voicemails left after 4:00 p.m. may not be returned until the following business day.  We are closed weekends and major holidays.  You do have access to a nurse 24-7, just call the main number to the clinic [phone] and do not press any options, hold on the line and a nurse will answer the phone.    For prescription refill requests, have your pharmacy contact our office and allow 72 hours.    Due to Covid, you will need to wear a mask upon entering the hospital. If you do not have a mask, a mask will be given to you at the Main Entrance upon arrival. For doctor visits, patients may have 1 support  person age 32 or older with them. For treatment visits, patients can not have anyone with them due to social distancing guidelines and our immunocompromised population.

## 2021-03-01 LAB — COPPER, SERUM: Copper: 122 ug/dL (ref 69–132)

## 2021-03-01 LAB — BETA 2 MICROGLOBULIN, SERUM: Beta-2 Microglobulin: 4.5 mg/L — ABNORMAL HIGH (ref 0.6–2.4)

## 2021-03-02 ENCOUNTER — Encounter: Payer: Self-pay | Admitting: "Endocrinology

## 2021-03-02 ENCOUNTER — Other Ambulatory Visit (HOSPITAL_COMMUNITY): Payer: Self-pay

## 2021-03-02 ENCOUNTER — Ambulatory Visit: Payer: Medicare Other | Admitting: "Endocrinology

## 2021-03-02 ENCOUNTER — Other Ambulatory Visit: Payer: Self-pay

## 2021-03-02 VITALS — BP 124/80 | HR 76 | Ht 72.0 in | Wt 230.2 lb

## 2021-03-02 DIAGNOSIS — D472 Monoclonal gammopathy: Secondary | ICD-10-CM

## 2021-03-02 DIAGNOSIS — E2740 Unspecified adrenocortical insufficiency: Secondary | ICD-10-CM | POA: Diagnosis not present

## 2021-03-02 DIAGNOSIS — D696 Thrombocytopenia, unspecified: Secondary | ICD-10-CM | POA: Diagnosis not present

## 2021-03-02 DIAGNOSIS — E039 Hypothyroidism, unspecified: Secondary | ICD-10-CM

## 2021-03-02 MED ORDER — LEVOTHYROXINE SODIUM 50 MCG PO TABS: 50.0000 ug | ORAL_TABLET | Freq: Every day | ORAL | 1 refills | Status: DC

## 2021-03-02 MED ORDER — PREDNISONE 10 MG PO TABS: 10.0000 mg | ORAL_TABLET | Freq: Every day | ORAL | 1 refills | Status: DC

## 2021-03-02 NOTE — Progress Notes (Signed)
03/02/2021, 12:41 PM   Subjective:    Patient ID: Roy [name], male    DOB: 1950/09/16, PCP Bridget Hartshorn, NP   Past Medical History:  Diagnosis Date   Atrial fibrillation ([place])    GERD (gastroesophageal reflux disease)    High cholesterol    Hypertension    Obesity    Past Surgical History:  Procedure Laterality Date   APPENDECTOMY     CATARACT EXTRACTION Bilateral 2022   CHOLECYSTECTOMY     ESOPHAGEAL DILATION     multiple times   JOINT REPLACEMENT Left    hip   Social History   Socioeconomic History   Marital status: Widowed    Spouse name: Not on file   Number of children: 3   Years of education: Not on file   Highest education level: Not on file  Occupational History   Occupation: Retired  Tobacco Use   Smoking status: Never   Smokeless tobacco: Never  Vaping Use   Vaping Use: Never used  Substance and Sexual Activity   Alcohol use: No   Drug use: No   Sexual activity: Not Currently  Other Topics Concern   Not on file  Social History Narrative   Not on file   Social Determinants of Health   Financial Resource Strain: Low Risk    Difficulty of Paying Living Expenses: Not hard at all  Food Insecurity: No Food Insecurity   Worried About Charity fundraiser in the Last Year: Never true   [place] in the Last Year: Never true  Transportation Needs: No Transportation Needs   Lack of Transportation (Medical): No   Lack of Transportation (Non-Medical): No  Physical Activity: Sufficiently Active   Days of Exercise per Week: 5 days   Minutes of Exercise per Session: 30 min  Stress: No Stress Concern Present   Feeling of Stress : Not at all  Social Connections: Moderately Isolated   Frequency of Communication with Friends and Family: More than three times a week   Frequency of Social Gatherings with Friends and Family: Three times a week   Attends Religious Services: 1 to 4 times per  year   Active Member of Clubs or Organizations: No   Attends Archivist Meetings: Never   Marital Status: Widowed   Family History  Problem Relation Age of Onset   Heart failure Father    Heart attack Father        Deceased    Thyroid disease Sister    Outpatient Encounter Medications as of 03/02/2021  Medication Sig   amoxicillin (AMOXIL) 500 MG tablet Take by mouth.   levothyroxine (SYNTHROID) 50 MCG tablet Take 1 tablet (50 mcg total) by mouth daily before breakfast.   predniSONE (DELTASONE) 10 MG tablet Take 1 tablet (10 mg total) by mouth daily with breakfast.   tamsulosin (FLOMAX) 0.4 MG CAPS capsule Take 1 capsule by mouth daily.   acetaminophen (TYLENOL) 325 MG tablet Take 2 tablets (650 mg total) by mouth every 6 (six) hours as needed for headache.   Calcium Carbonate-Vitamin D (CALCIUM-VITAMIN D) 600-125 MG-UNIT TABS Take by mouth.   Cholecalciferol (VITAMIN D3) 25 MCG (1000 UT) CAPS Take by mouth daily.  ferrous sulfate 325 (65 FE) MG EC tablet Take 1 tablet (325 mg total) by mouth daily with breakfast.   lidocaine (XYLOCAINE) 2 % jelly Apply to affected area daily prn   magnesium oxide (MAG-OX) 400 (240 Mg) MG tablet Take 1 tablet (400 mg total) by mouth 2 (two) times daily.   midodrine (PROAMATINE) 5 MG tablet Take 1 tablet (5 mg total) by mouth 3 (three) times daily with meals.   omeprazole (PRILOSEC) 10 MG capsule Take 1 capsule by mouth daily.   potassium chloride (KLOR-CON) 10 MEQ tablet Take 10 mEq by mouth 2 (two) times daily.   pramoxine (PROCTOFOAM) 1 % foam SMARTSIG:Rectally Every 2 Hours PRN   psyllium (REGULOID) 0.52 g capsule Take by mouth.   rosuvastatin (CRESTOR) 5 MG tablet Take 1 tablet by mouth at bedtime.   vitamin B-12 (CYANOCOBALAMIN) 1000 MCG tablet Take 1 tablet (1,000 mcg total) by mouth daily.   warfarin (COUMADIN) 5 MG tablet Take 1 tablet (5 mg total) by mouth every evening.   [DISCONTINUED] dexamethasone (DECADRON) 1 MG tablet TAKE 1  TABLET BY MOUTH TWICE DAILY WITH A MEAL   No facility-administered encounter medications on file as of 03/02/2021.   ALLERGIES: Allergies  Allergen Reactions   Adhesive [Tape]     Contact dermatitis   Sulfa Antibiotics     UNKNOWN Childhood reaction    VACCINATION STATUS: Immunization History  Administered Date(s) Administered   Influenza,inj,Quad PF,6+ Mos 04/28/2015   Moderna Sars-Covid-2 Vaccination 07/03/2019, 07/31/2019   PFIZER(Purple Top)SARS-COV-2 Vaccination 03/25/2020   Pneumococcal Polysaccharide-23 04/28/2015    HPI Roy [name] is 70 y.o. male who presents today with a medical history as above. he is being seen in follow-up after he was seen in consultation for adrenal insufficiency requested by Bridget Hartshorn, NP.   After his last visit, he was sent for ACTH stimulation test which confirms adrenal insufficiency of primary origin.  See lab details below.  His previsit labs also confirm hypothyroidism of secondary etiology. He was recently hospitalized due to dizziness and was found to have hyponatremia.  -He is currently on dexamethasone 1 mg p.o. daily, as well as midodrine 5 mg 3 times daily.  He denies any history of head injury, visual field deficits, no headaches.  He denies any prior history of abdominal surgery, injury, nor infections.   He reports steady weight after he lost 15 pounds in the last 2 months unintentionally.    He denies any exposure to heavy dose steroids in the past.  His other medical problems include hyperlipidemia on treatment with Crestor.  He has paroxysmal atrial fibrillation, left anterior fascicular block, currently on anticoagulation using Coumadin 5 mg every evening.    Review of Systems  Constitutional: + Recent weight loss, + fatigue, no subjective hyperthermia, no subjective hypothermia  Objective:    Vitals with BMI 03/02/2021 02/28/2021 02/23/2021  Height 6\' 0"  - -  Weight 230 lbs 3 oz 229 lbs 12 oz -  BMI 26.71 -  -  Systolic 245 809 983  Diastolic 80 88 86  Pulse 76 64 82    BP 124/80   Pulse 76   Ht 6' (1.829 m)   Wt 230 lb 3.2 oz (104.4 kg)   BMI 31.22 kg/m   Wt Readings from Last 3 Encounters:  03/02/21 230 lb 3.2 oz (104.4 kg)  02/28/21 229 lb 11.5 oz (104.2 kg)  01/20/21 250 lb 6.4 oz (113.6 kg)    Physical Exam  Constitutional:  Body mass index is 31.22 kg/m.,  not in acute distress, normal state of mind, +  walks with a cane   CMP ( most recent) CMP     Component Value Date/Time   NA 137 02/23/2021 0834   K 4.3 02/23/2021 0834   CL 97 (L) 02/23/2021 0834   CO2 32 02/23/2021 0834   GLUCOSE 76 02/23/2021 0834   BUN 19 02/23/2021 0834   CREATININE 0.66 02/23/2021 0834   CREATININE 0.82 11/11/2020 1046   CALCIUM 9.3 02/23/2021 0834   PROT 6.4 (L) 02/23/2021 0834   ALBUMIN 3.7 02/23/2021 0834   AST 77 (H) 02/23/2021 0834   AST 74 (H) 11/11/2020 1046   ALT 43 02/23/2021 0834   ALT 21 11/11/2020 1046   ALKPHOS 70 02/23/2021 0834   BILITOT 1.4 (H) 02/23/2021 0834   BILITOT 2.3 (H) 11/11/2020 1046   GFRNONAA >60 02/23/2021 0834   GFRNONAA >60 11/11/2020 1046   GFRAA >60 04/29/2019 1033     Diabetic Labs (most recent): Lab Results  Component Value Date   HGBA1C 5.1 01/08/2021     Lipid Panel ( most recent) Lipid Panel     Component Value Date/Time   CHOL 147 04/29/2015 0426   TRIG 153 (H) 04/29/2015 0426   HDL 19 (L) 04/29/2015 0426   CHOLHDL 7.7 04/29/2015 0426   VLDL 31 04/29/2015 0426   LDLCALC 97 04/29/2015 0426      Lab Results  Component Value Date   TSH 0.770 02/23/2021   TSH 0.791 02/21/2021   TSH 0.036 (L) 01/08/2021   TSH 0.067 (L) 11/09/2020   TSH 0.030 (L) 10/21/2020   TSH 3.836 03/17/2018   TSH 6.505 (H) 04/27/2015   TSH 10.503 (H) 04/27/2015   FREET4 0.31 (L) 02/21/2021   FREET4 0.74 01/09/2021   FREET4 1.12 11/09/2020   FREET4 1.75 (H) 10/21/2020   FREET4 0.98 03/17/2018   FREET4 0.93 04/28/2015    Results for EOGHAN, BELCHER  (MRN [id number]) as of 03/02/2021 12:39  Ref. Range 02/23/2021 09:15  Cortisol, Base Latest Units: ug/dL <0.4  Cortisol, 30 Min Latest Units: ug/dL 1.2  Cortisol, 60 Min Latest Units: ug/dL 1.8    Assessment & Plan:   1. Adrenal insufficiency ([place]) 2.  Hypothyroidism   His previsit lab work shows primary adrenal insufficiency and possible secondary hypothyroidism. I discussed with him the need for steroids replacement.  He stands to benefit from steroid replacement.  I discussed and prescribed prednisone 10 mg p.o. daily at breakfast.  He is advised to wear a medical alert for adrenal sufficiency.  We discussed about sick day rules on prednisone.  -I have emphasized the absolute need for steroid replacement on a daily basis for long-term.  For his newly diagnosed hypothyroidism, I discussed and prescribe levothyroxine 50 mcg p.o. daily before breakfast.   - We discussed about the correct intake of his thyroid hormone, on empty stomach at fasting, with water, separated by at least 30 minutes from breakfast and other medications,  and separated by more than 4 hours from calcium, iron, multivitamins, acid reflux medications (PPIs). -Patient is made aware of the fact that thyroid hormone replacement is needed for life, dose to be adjusted by periodic monitoring of thyroid function tests.   He is advised on water restriction, advised to drink 2 L of water daily down from 4 L daily.  He is advised to allow more Gatorade/electrolyte liquids.  His presenting hyponatremia may be multifactorial including SIADH. -He is encouraged  to continue his midodrine 5 mg p.o. 3 times daily. -He is advised on fall precaution, encouraged to use his cane for equilibrium.   -Symptomatic of any CNS lesion.  He will be considered for MRI of the brain on subsequent visits if he becomes symptomatic.   - he is advised to maintain close follow up with [place], Karie Schwalbe, NP for primary care needs.  I spent 33  minutes in the care of the patient today including review of labs from Thyroid Function, CMP, and other relevant labs ; imaging/biopsy records (current and previous including abstractions from other facilities); face-to-face time discussing  his lab results and symptoms, medications doses, his options of short and long term treatment based on the latest standards of care / guidelines;   and documenting the encounter.  Roy [name]  participated in the discussions, expressed understanding, and voiced agreement with the above plans.  All questions were answered to his satisfaction. he is encouraged to contact clinic should he have any questions or concerns prior to his return visit.   Follow up plan: Return in about 3 months (around 06/02/2021) for F/U with Pre-visit Labs.   Glade Lloyd, MD [facility] [address] [place], [place] [address] Phone: [phone]  Fax: [phone]     03/02/2021, 12:41 PM  This note was partially dictated with voice recognition software. Similar sounding words can be transcribed inadequately or may not  be corrected upon review.  Dexamethasone

## 2021-03-03 LAB — IMMUNOFIXATION ELECTROPHORESIS
IgA: 169 mg/dL (ref 61–437)
IgG (Immunoglobin G), Serum: 793 mg/dL (ref 603–1613)
IgM (Immunoglobulin M), Srm: 67 mg/dL (ref 15–143)
Total Protein ELP: 6.1 g/dL (ref 6.0–8.5)

## 2021-03-03 NOTE — Progress Notes (Signed)
Patient on schedule for BMB 03/08/2021, called and spoke with patient on phone with pre procedure instructions given. Made aware to be here @ 0730, NPO after MN prior to procedure as well as driver post procedure/recovery/discharge. Stated understanding.

## 2021-03-06 ENCOUNTER — Other Ambulatory Visit: Payer: Self-pay | Admitting: Radiology

## 2021-03-06 LAB — UPEP/UIFE/LIGHT CHAINS/TP, 24-HR UR
% BETA, Urine: 0 %
ALPHA 1 URINE: 0 %
Albumin, U: 0 %
Alpha 2, Urine: 0 %
Free Kappa Lt Chains,Ur: 8.82 mg/L (ref 1.17–86.46)
Free Kappa/Lambda Ratio: 3.75 (ref 1.83–14.26)
Free Lambda Lt Chains,Ur: 2.35 mg/L (ref 0.27–15.21)
GAMMA GLOBULIN URINE: 0 %
Total Protein, Urine-Ur/day: 171 mg/24 hr — ABNORMAL HIGH (ref 30–150)
Total Protein, Urine: 7.7 mg/dL
Total Volume: 2225

## 2021-03-07 ENCOUNTER — Other Ambulatory Visit: Payer: Self-pay | Admitting: Radiology

## 2021-03-07 ENCOUNTER — Telehealth: Payer: Self-pay | Admitting: "Endocrinology

## 2021-03-07 NOTE — Telephone Encounter (Signed)
Discussed with pt, understanding voiced. 

## 2021-03-07 NOTE — Telephone Encounter (Signed)
Patient came by and said that the hospital had orginally given him a RX for his BP which is called Midodrine 5 MG. He said it is $55.00 and he can not afford that with all of his other medications. He did check with his primary doctor and she said that he needed to see if you could give him something cheaper for this. Please Advise. [phone]

## 2021-03-08 ENCOUNTER — Other Ambulatory Visit: Payer: Self-pay

## 2021-03-08 ENCOUNTER — Ambulatory Visit
Admission: RE | Admit: 2021-03-08 | Discharge: 2021-03-08 | Disposition: A | Discharge status: Home / Self Care | Payer: Medicare Other | Source: Ambulatory Visit | Attending: Physician Assistant | Admitting: Physician Assistant

## 2021-03-08 DIAGNOSIS — Z79890 Hormone replacement therapy (postmenopausal): Secondary | ICD-10-CM | POA: Insufficient documentation

## 2021-03-08 DIAGNOSIS — Z8512 Personal history of malignant neoplasm of trachea: Secondary | ICD-10-CM | POA: Insufficient documentation

## 2021-03-08 DIAGNOSIS — M19012 Primary osteoarthritis, left shoulder: Secondary | ICD-10-CM | POA: Insufficient documentation

## 2021-03-08 DIAGNOSIS — D72819 Decreased white blood cell count, unspecified: Secondary | ICD-10-CM

## 2021-03-08 DIAGNOSIS — D472 Monoclonal gammopathy: Secondary | ICD-10-CM | POA: Diagnosis not present

## 2021-03-08 DIAGNOSIS — Z79899 Other long term (current) drug therapy: Secondary | ICD-10-CM | POA: Insufficient documentation

## 2021-03-08 DIAGNOSIS — Z792 Long term (current) use of antibiotics: Secondary | ICD-10-CM | POA: Diagnosis not present

## 2021-03-08 DIAGNOSIS — Z7952 Long term (current) use of systemic steroids: Secondary | ICD-10-CM | POA: Insufficient documentation

## 2021-03-08 DIAGNOSIS — C8510 Unspecified B-cell lymphoma, unspecified site: Secondary | ICD-10-CM | POA: Insufficient documentation

## 2021-03-08 DIAGNOSIS — Z882 Allergy status to sulfonamides status: Secondary | ICD-10-CM | POA: Diagnosis not present

## 2021-03-08 DIAGNOSIS — D696 Thrombocytopenia, unspecified: Secondary | ICD-10-CM

## 2021-03-08 DIAGNOSIS — Z7901 Long term (current) use of anticoagulants: Secondary | ICD-10-CM | POA: Diagnosis not present

## 2021-03-08 DIAGNOSIS — Z888 Allergy status to other drugs, medicaments and biological substances status: Secondary | ICD-10-CM | POA: Diagnosis not present

## 2021-03-08 DIAGNOSIS — Z96642 Presence of left artificial hip joint: Secondary | ICD-10-CM | POA: Diagnosis not present

## 2021-03-08 DIAGNOSIS — Z862 Personal history of diseases of the blood and blood-forming organs and certain disorders involving the immune mechanism: Secondary | ICD-10-CM

## 2021-03-08 LAB — CBC WITH DIFFERENTIAL/PLATELET
Abs Immature Granulocytes: 0.03 10*3/uL (ref 0.00–0.07)
Basophils Absolute: 0 10*3/uL (ref 0.0–0.1)
Basophils Relative: 0 %
Eosinophils Absolute: 0 10*3/uL (ref 0.0–0.5)
Eosinophils Relative: 0 %
HCT: 45.9 % (ref 39.0–52.0)
Hemoglobin: 15.5 g/dL (ref 13.0–17.0)
Immature Granulocytes: 1 %
Lymphocytes Relative: 39 %
Lymphs Abs: 1.2 10*3/uL (ref 0.7–4.0)
MCH: 31.3 pg (ref 26.0–34.0)
MCHC: 33.8 g/dL (ref 30.0–36.0)
MCV: 92.7 fL (ref 80.0–100.0)
Monocytes Absolute: 0.1 10*3/uL (ref 0.1–1.0)
Monocytes Relative: 4 %
Neutro Abs: 1.8 10*3/uL (ref 1.7–7.7)
Neutrophils Relative %: 56 %
Platelets: 90 10*3/uL — ABNORMAL LOW (ref 150–400)
RBC: 4.95 MIL/uL (ref 4.22–5.81)
RDW: 14.6 % (ref 11.5–15.5)
WBC: 3.2 10*3/uL — ABNORMAL LOW (ref 4.0–10.5)
nRBC: 3.5 % — ABNORMAL HIGH (ref 0.0–0.2)

## 2021-03-08 LAB — METHYLMALONIC ACID, SERUM: Methylmalonic Acid, Quantitative: 284 nmol/L (ref 0–378)

## 2021-03-08 MED ORDER — FENTANYL CITRATE (PF) 100 MCG/2ML IJ SOLN
INTRAMUSCULAR | Status: AC | PRN
  Administered 2021-03-08: 50 ug via INTRAVENOUS

## 2021-03-08 MED ORDER — HEPARIN SOD (PORK) LOCK FLUSH 100 UNIT/ML IV SOLN
INTRAVENOUS | Status: AC
  Filled 2021-03-08: qty 5

## 2021-03-08 MED ORDER — SODIUM CHLORIDE 0.9 % IV SOLN
INTRAVENOUS | Status: DC
Start: 2021-03-08 — End: 2021-03-09

## 2021-03-08 MED ORDER — FENTANYL CITRATE (PF) 100 MCG/2ML IJ SOLN
INTRAMUSCULAR | Status: AC
  Filled 2021-03-08: qty 2

## 2021-03-08 MED ORDER — MIDAZOLAM HCL 2 MG/2ML IJ SOLN
INTRAMUSCULAR | Status: AC
  Filled 2021-03-08: qty 2

## 2021-03-08 MED ORDER — MIDAZOLAM HCL 2 MG/2ML IJ SOLN
INTRAMUSCULAR | Status: AC | PRN
  Administered 2021-03-08 (×2): .5 mg via INTRAVENOUS

## 2021-03-08 NOTE — H&P (Signed)
Chief Complaint: Patient was seen in consultation today for leukopenia and thrombocytopenia with hemolysis without anemia at the request of [place]  Referring Physician(s): Pennington,Rebekah M  Supervising Physician: Juliet Rude  Patient Status: [place]  History of Present Illness: Roy [name] is a 70 y.o. male with PMHx for hemolysis without anemia who follows with hematology and seen 02/28/21. Patient found to have new onset leukopenia and thrombocytopenia. Request received for image guided bone marrow biopsy with moderate sedation. The patient has had a H&P performed within the last 30 days, all history, medications, and exam have been reviewed. The patient denies any interval changes since the H&P.  The patient denies any chest pain, shortness of breath or palpitations. The patient denies any history of sleep apnea or chronic oxygen use. He has no known complications to sedation.    Past Medical History:  Diagnosis Date  . Atrial fibrillation ([place])   . GERD (gastroesophageal reflux disease)   . High cholesterol   . Hypertension   . Obesity     Past Surgical History:  Procedure Laterality Date  . APPENDECTOMY    . CATARACT EXTRACTION Bilateral 2022  . CHOLECYSTECTOMY    . ESOPHAGEAL DILATION     multiple times  . JOINT REPLACEMENT Left    hip    Allergies: Adhesive [tape] and Sulfa antibiotics  Medications: Prior to Admission medications   Medication Sig Start Date End Date Taking? Authorizing Provider  acetaminophen (TYLENOL) 325 MG tablet Take 2 tablets (650 mg total) by mouth every 6 (six) hours as needed for headache. 01/13/21   Roxan Hockey, MD  amoxicillin (AMOXIL) 500 MG tablet Take by mouth. 02/28/21 03/10/21  [provider]  Calcium Carbonate-Vitamin D (CALCIUM-VITAMIN D) 600-125 MG-UNIT TABS Take by mouth.    [provider]  Cholecalciferol (VITAMIN D3) 25 MCG (1000 UT) CAPS Take by mouth daily.     [provider]  ferrous sulfate 325 (65 FE) MG EC tablet Take 1 tablet (325 mg total) by mouth daily with breakfast. 02/28/21   Tarri Abernethy M, PA-C  levothyroxine (SYNTHROID) 50 MCG tablet Take 1 tablet (50 mcg total) by mouth daily before breakfast. 03/02/21   Nida, Marella Chimes, MD  lidocaine (XYLOCAINE) 2 % jelly Apply to affected area daily prn 01/16/21 01/16/22  [provider]  magnesium oxide (MAG-OX) 400 (240 Mg) MG tablet Take 1 tablet (400 mg total) by mouth 2 (two) times daily. 10/24/20   [name], MD  midodrine (PROAMATINE) 5 MG tablet Take 1 tablet (5 mg total) by mouth 3 (three) times daily with meals. 01/13/21   Roxan Hockey, MD  omeprazole (PRILOSEC) 10 MG capsule Take 1 capsule by mouth daily. 11/15/20   [provider]  potassium chloride (KLOR-CON) 10 MEQ tablet Take 10 mEq by mouth 2 (two) times daily. 11/04/20   [provider]  pramoxine (PROCTOFOAM) 1 % foam SMARTSIG:Rectally Every 2 Hours PRN 01/20/21   [provider]  predniSONE (DELTASONE) 10 MG tablet Take 1 tablet (10 mg total) by mouth daily with breakfast. 03/02/21   Nida, Marella Chimes, MD  psyllium (REGULOID) 0.52 g capsule Take by mouth. 01/16/21 01/16/22  [provider]  rosuvastatin (CRESTOR) 5 MG tablet Take 1 tablet by mouth at bedtime. 08/23/20   [provider]  tamsulosin (FLOMAX) 0.4 MG CAPS capsule Take 1 capsule by mouth daily. 02/28/21   [provider]  vitamin B-12 (CYANOCOBALAMIN) 1000 MCG tablet Take 1 tablet (1,000  mcg total) by mouth daily. 02/28/21   Harriett Rush, PA-C  warfarin (COUMADIN) 5 MG tablet Take 1 tablet (5 mg total) by mouth every evening. 01/15/21   Roxan Hockey, MD     Family History  Problem Relation Age of Onset  . Heart failure Father   . Heart attack Father        Deceased   . Thyroid disease Sister     Social History   Socioeconomic History  . Marital status: Widowed    Spouse  name: Not on file  . Number of children: 3  . Years of education: Not on file  . Highest education level: Not on file  Occupational History  . Occupation: Retired  Tobacco Use  . Smoking status: Never  . Smokeless tobacco: Never  Vaping Use  . Vaping Use: Never used  Substance and Sexual Activity  . Alcohol use: No  . Drug use: No  . Sexual activity: Not Currently  Other Topics Concern  . Not on file  Social History Narrative  . Not on file   Social Determinants of Health   Financial Resource Strain: Low Risk   . Difficulty of Paying Living Expenses: Not hard at all  Food Insecurity: No Food Insecurity  . Worried About Charity fundraiser in the Last Year: Never true  . Ran Out of Food in the Last Year: Never true  Transportation Needs: No Transportation Needs  . Lack of Transportation (Medical): No  . Lack of Transportation (Non-Medical): No  Physical Activity: Sufficiently Active  . Days of Exercise per Week: 5 days  . Minutes of Exercise per Session: 30 min  Stress: No Stress Concern Present  . Feeling of Stress : Not at all  Social Connections: Moderately Isolated  . Frequency of Communication with Friends and Family: More than three times a week  . Frequency of Social Gatherings with Friends and Family: Three times a week  . Attends Religious Services: 1 to 4 times per year  . Active Member of Clubs or Organizations: No  . Attends Archivist Meetings: Never  . Marital Status: Widowed   Review of Systems: A 12 point ROS discussed and pertinent positives are indicated in the HPI above.  All other systems are negative.  Review of Systems  Vital Signs: BP (!) 164/93   Pulse 75   Temp (!) 97.5 F (36.4 C) (Oral)   Resp 17   Ht 6' (1.829 m)   Wt 230 lb 3.2 oz (104.4 kg)   SpO2 97%   BMI 31.22 kg/m   Physical Exam Constitutional:      Appearance: Normal appearance.  HENT:     Head: Normocephalic and atraumatic.  Cardiovascular:     Rate and  Rhythm: Normal rate and regular rhythm.  Pulmonary:     Effort: Pulmonary effort is normal. No respiratory distress.     Breath sounds: Normal breath sounds.  Skin:    General: Skin is warm and dry.  Neurological:     Mental Status: He is alert and oriented to person, place, and time.  Psychiatric:        Thought Content: Thought content normal.    Imaging: DG Bone Survey Met  Result Date: 03/01/2021 CLINICAL DATA:  Monoclonal gammopathy of unknown significance EXAM: METASTATIC BONE SURVEY COMPARISON:  None. FINDINGS: No focal lytic lesions are identified. There is diffuse ankylosis of the cervical, thoracic, and lumbar spine with fine syndesmophytes and ossification of the inter  spinous ligaments in keeping with changes of ankylosing spondylitis. There is ankylosis of the sacroiliac joints bilaterally. Mild left glenohumeral degenerative arthritis. Cholecystectomy and left total hip arthroplasty has been performed. IMPRESSION: No focal lytic lesion identified. Findings in keeping with ankylosing spondylitis. Electronically Signed   By: Fidela Salisbury M.D.   On: 03/01/2021 20:38    Labs:  CBC: Recent Labs    01/12/21 0531 01/13/21 0814 02/21/21 1059 02/27/21 1014  WBC 7.9 4.0 2.1* 2.3*  HGB 13.5 13.8 15.6 15.2  HCT 40.2 41.0 48.5 47.0  PLT 221 214 51* 64*    COAGS: Recent Labs    01/10/21 0600 01/11/21 0534 01/12/21 0531 01/13/21 0814  INR 2.6* 2.1* 2.4* 3.1*    BMP: Recent Labs    01/12/21 0531 01/13/21 0814 02/21/21 1059 02/23/21 0834  NA 136 136 133* 137  K 3.6 3.7 4.1 4.3  CL 97* 99 94* 97*  CO2 27 28 31  32  GLUCOSE 98 79 84 76  BUN 19 17 25* 19  CALCIUM 10.0 9.3 9.1 9.3  CREATININE 0.84 0.69 0.83 0.66  GFRNONAA >60 >60 >60 >60    LIVER FUNCTION TESTS: Recent Labs    01/12/21 0531 01/13/21 0814 02/21/21 1059 02/23/21 0834  BILITOT 1.9* 2.0* 1.9* 1.4*  AST 98* 85* 85* 77*  ALT 17 19 47* 43  ALKPHOS 53 50 69 70  PROT 6.7 6.5 6.9 6.4*  ALBUMIN  4.0 3.9 4.0 3.7    Assessment and Plan: 70 year old male with PMHx for hemolysis without anemia who follows with hematology and seen 02/28/21. Patient found to have new onset leukopenia and thrombocytopenia. Request received for image guided bone marrow biopsy with moderate sedation. No changes in medical history since last seen 10/25.   The patient has been NPO, labs and vitals have been reviewed.  Risks and benefits of bone marrow biopsy with moderate sedation was discussed with the patient and/or patient's family including, but not limited to bleeding, infection, damage to adjacent structures or low yield requiring additional tests.  All of the questions were answered and there is agreement to proceed.  Consent signed and in chart.   Thank you for this interesting consult.  I greatly enjoyed meeting Roy [name] and look forward to participating in their care.  A copy of this report was sent to the requesting provider on this date.  Electronically Signed: Hedy Jacob, PA-C 03/08/2021, 8:34 AM   I spent a total of 15 Minutes in face to face in clinical consultation, greater than 50% of which was counseling/coordinating care for MGUS with unexplained hemolysis, leukopenia and thrombocytopenia.

## 2021-03-08 NOTE — Procedures (Signed)
Interventional Radiology Procedure Note  Date of Procedure: 03/08/2021  Procedure: CT guided BMBx  Findings:  1. Successful CT guided BMBx of right posterior ilium    Complications: No immediate complications noted.   Estimated Blood Loss: minimal  Follow-up and Recommendations: 1. Bedrest 1 hour    Albin Felling, MD  Vascular & Interventional Radiology  03/08/2021 9:57 AM

## 2021-03-08 NOTE — Progress Notes (Signed)
Patient clinically stable post BMB per DR El-Abd, tolerated well,vitals stable pre and post procedure. Received Versed 1 mg along with Fentanyl 50 mcg IV for procedure. Report given to Lea Rn in specials post procedure.

## 2021-03-10 LAB — SURGICAL PATHOLOGY

## 2021-03-13 ENCOUNTER — Other Ambulatory Visit: Payer: Self-pay

## 2021-03-13 ENCOUNTER — Other Ambulatory Visit (HOSPITAL_COMMUNITY): Payer: Self-pay | Admitting: Physician Assistant

## 2021-03-13 ENCOUNTER — Inpatient Hospital Stay (HOSPITAL_COMMUNITY): Payer: Medicare Other

## 2021-03-13 ENCOUNTER — Inpatient Hospital Stay (HOSPITAL_COMMUNITY): Payer: Medicare Other | Attending: Hematology | Admitting: Physician Assistant

## 2021-03-13 VITALS — BP 136/87 | HR 88 | Temp 98.0°F | Resp 18 | Wt 233.2 lb

## 2021-03-13 DIAGNOSIS — C8330 Diffuse large B-cell lymphoma, unspecified site: Secondary | ICD-10-CM | POA: Diagnosis not present

## 2021-03-13 DIAGNOSIS — Z7952 Long term (current) use of systemic steroids: Secondary | ICD-10-CM | POA: Insufficient documentation

## 2021-03-13 DIAGNOSIS — E790 Hyperuricemia without signs of inflammatory arthritis and tophaceous disease: Secondary | ICD-10-CM | POA: Insufficient documentation

## 2021-03-13 DIAGNOSIS — K5900 Constipation, unspecified: Secondary | ICD-10-CM | POA: Insufficient documentation

## 2021-03-13 DIAGNOSIS — I10 Essential (primary) hypertension: Secondary | ICD-10-CM | POA: Insufficient documentation

## 2021-03-13 DIAGNOSIS — I4891 Unspecified atrial fibrillation: Secondary | ICD-10-CM | POA: Diagnosis not present

## 2021-03-13 DIAGNOSIS — Z7901 Long term (current) use of anticoagulants: Secondary | ICD-10-CM | POA: Insufficient documentation

## 2021-03-13 DIAGNOSIS — G629 Polyneuropathy, unspecified: Secondary | ICD-10-CM | POA: Diagnosis not present

## 2021-03-13 DIAGNOSIS — C8590 Non-Hodgkin lymphoma, unspecified, unspecified site: Secondary | ICD-10-CM | POA: Diagnosis not present

## 2021-03-13 DIAGNOSIS — Z79899 Other long term (current) drug therapy: Secondary | ICD-10-CM | POA: Insufficient documentation

## 2021-03-13 LAB — CBC WITH DIFFERENTIAL/PLATELET
Band Neutrophils: 2 %
Basophils Absolute: 0 10*3/uL (ref 0.0–0.1)
Basophils Relative: 0 %
Eosinophils Absolute: 0.1 10*3/uL (ref 0.0–0.5)
Eosinophils Relative: 2 %
HCT: 43.1 % (ref 39.0–52.0)
Hemoglobin: 14.4 g/dL (ref 13.0–17.0)
Lymphocytes Relative: 34 %
Lymphs Abs: 1 10*3/uL (ref 0.7–4.0)
MCH: 31.8 pg (ref 26.0–34.0)
MCHC: 33.4 g/dL (ref 30.0–36.0)
MCV: 95.1 fL (ref 80.0–100.0)
Monocytes Absolute: 0.1 10*3/uL (ref 0.1–1.0)
Monocytes Relative: 4 %
Neutro Abs: 1.7 10*3/uL (ref 1.7–7.7)
Neutrophils Relative %: 58 %
Platelets: 73 10*3/uL — ABNORMAL LOW (ref 150–400)
RBC: 4.53 MIL/uL (ref 4.22–5.81)
RDW: 15.2 % (ref 11.5–15.5)
WBC: 2.9 10*3/uL — ABNORMAL LOW (ref 4.0–10.5)
nRBC: 6 /100 WBC — ABNORMAL HIGH
nRBC: 8.2 % — ABNORMAL HIGH (ref 0.0–0.2)

## 2021-03-13 LAB — COMPREHENSIVE METABOLIC PANEL
ALT: 59 U/L — ABNORMAL HIGH (ref 0–44)
AST: 76 U/L — ABNORMAL HIGH (ref 15–41)
Albumin: 3.6 g/dL (ref 3.5–5.0)
Alkaline Phosphatase: 90 U/L (ref 38–126)
Anion gap: 10 (ref 5–15)
BUN: 20 mg/dL (ref 8–23)
CO2: 28 mmol/L (ref 22–32)
Calcium: 9.4 mg/dL (ref 8.9–10.3)
Chloride: 96 mmol/L — ABNORMAL LOW (ref 98–111)
Creatinine, Ser: 0.95 mg/dL (ref 0.61–1.24)
GFR, Estimated: >60 mL/min (ref >60)
Glucose, Bld: 107 mg/dL — ABNORMAL HIGH (ref 70–99)
Potassium: 4.3 mmol/L (ref 3.5–5.1)
Sodium: 134 mmol/L — ABNORMAL LOW (ref 135–145)
Total Bilirubin: 1.6 mg/dL — ABNORMAL HIGH (ref 0.3–1.2)
Total Protein: 6.5 g/dL (ref 6.5–8.1)

## 2021-03-13 LAB — LACTATE DEHYDROGENASE: LDH: 497 U/L — ABNORMAL HIGH (ref 98–192)

## 2021-03-13 LAB — HEPATITIS B CORE ANTIBODY, TOTAL: Hep B Core Total Ab: NONREACTIVE

## 2021-03-13 LAB — HEPATITIS B SURFACE ANTIBODY,QUALITATIVE: Hep B S Ab: NONREACTIVE

## 2021-03-13 LAB — URIC ACID: Uric Acid, Serum: 9.2 mg/dL — ABNORMAL HIGH (ref 3.7–8.6)

## 2021-03-13 LAB — HEPATITIS B SURFACE ANTIGEN: Hepatitis B Surface Ag: NONREACTIVE

## 2021-03-13 NOTE — Patient Instructions (Signed)
[place] at [facility] Discharge Instructions  You were seen today by Tarri Abernethy PA-C to discuss your bone marrow biopsy results.  As we discussed, your bone marrow biopsy shows signs of a type of cancer, lymphoma.  Additional testing needs to be done before we have the specific type of lymphoma and can make a treatment plan.  We will check a PET scan on you later this week (Thursday, 03/16/2021).  You will have an appoint with Dr. Delton Coombes to discuss these results and next steps on Wednesday, 03/23/2021.     Thank you for choosing [place] at [facility] to provide your oncology and hematology care.  To afford each patient quality time with our provider, please arrive at least 15 minutes before your scheduled appointment time.   If you have a lab appointment with the [facility] please come in thru the Main Entrance and check in at the main information desk.  You need to re-schedule your appointment should you arrive 10 or more minutes late.  We strive to give you quality time with our providers, and arriving late affects you and other patients whose appointments are after yours.  Also, if you no show three or more times for appointments you may be dismissed from the clinic at the providers discretion.     Again, thank you for choosing [facility].  Our hope is that these requests will decrease the amount of time that you wait before being seen by our physicians.       _____________________________________________________________  Should you have questions after your visit to [facility], please contact our office at [phone] and follow the prompts.  Our office hours are 8:00 a.m. and 4:30 p.m. Monday - Friday.  Please note that voicemails left after 4:00 p.m. may not be returned until the following business day.  We are closed weekends and major holidays.  You do have access to a nurse 24-7, just call  the main number to the clinic [phone] and do not press any options, hold on the line and a nurse will answer the phone.    For prescription refill requests, have your pharmacy contact our office and allow 72 hours.    Due to Covid, you will need to wear a mask upon entering the hospital. If you do not have a mask, a mask will be given to you at the Main Entrance upon arrival. For doctor visits, patients may have 1 support person age 83 or older with them. For treatment visits, patients can not have anyone with them due to social distancing guidelines and our immunocompromised population.

## 2021-03-13 NOTE — Progress Notes (Signed)
Roy [name], Roy [name] [id number]   CLINIC:  Medical Oncology/Hematology  PCP:  Roy Hartshorn, NP [name] [phone]   REASON FOR VISIT:  Follow-up for leukopenia and thrombocytopenia with new findings suggesting lymphoma  PRIOR THERAPY: None  CURRENT THERAPY: Under work-up  INTERVAL HISTORY:  Roy [name] 70 y.o. male returns to discuss results of recent bone marrow biopsy.  Biopsy was obtained due to hemolysis of unknown origin, thrombocytopenia, and leukopenia.  At today's visit, he reports feeling well.  No recent hospitalizations, surgeries, or changes in baseline health status.  He denies any painless swelling of lymph nodes, but does report that he had some swelling on the left side of his face a few weeks ago associated with sinus infection, but that this has resolved after being treated with amoxicillin. He denies any persistent fatigue. No fever, chills, night sweats. No new shortness of breath. No abnormal skin pruritus. He does admit to some unexplained weight loss, has lost over 40 pounds since June 2022, which he attributed to hospital stays and not eating as much as he used to.  Apart from sinus infection, he denies any other recent infections. No chest pain, DOE, dizziness, syncope. No abnormal bruising, petechial rash, or bleeding.  He has 100% energy and 100% appetite.   REVIEW OF SYSTEMS:  Review of Systems  Constitutional:  Positive for unexpected weight change. Negative for appetite change, chills, diaphoresis, fatigue and fever.  HENT:   Negative for lump/mass and nosebleeds.   Eyes:  Negative for eye problems.  Respiratory:  Negative for cough, hemoptysis and shortness of breath.   Cardiovascular:  Negative for chest pain, leg swelling and palpitations.  Gastrointestinal:  Negative for abdominal pain, blood in stool, constipation, diarrhea, nausea and vomiting.   Genitourinary:  Negative for hematuria.   Skin: Negative.   Neurological:  Positive for numbness (Tingling in right fingers). Negative for dizziness, headaches and light-headedness.  Hematological:  Does not bruise/bleed easily.     PAST MEDICAL/SURGICAL HISTORY:  Past Medical History:  Diagnosis Date   Atrial fibrillation (HCC)    GERD (gastroesophageal reflux disease)    High cholesterol    Hypertension    Obesity    Past Surgical History:  Procedure Laterality Date   APPENDECTOMY     CATARACT EXTRACTION Bilateral 2022   CHOLECYSTECTOMY     ESOPHAGEAL DILATION     multiple times   JOINT REPLACEMENT Left    hip     SOCIAL HISTORY:  Social History   Socioeconomic History   Marital status: Widowed    Spouse name: Not on file   Number of children: 3   Years of education: Not on file   Highest education level: Not on file  Occupational History   Occupation: Retired  Tobacco Use   Smoking status: Never   Smokeless tobacco: Never  Vaping Use   Vaping Use: Never used  Substance and Sexual Activity   Alcohol use: No   Drug use: No   Sexual activity: Not Currently  Other Topics Concern   Not on file  Social History Narrative   Not on file   Social Determinants of Health   Financial Resource Strain: Low Risk    Difficulty of Paying Living Expenses: Not hard at all  Food Insecurity: No Food Insecurity   Worried About [place] in the Last Year: Never true   Ran Out of  Food in the Last Year: Never true  Transportation Needs: No Transportation Needs   Lack of Transportation (Medical): No   Lack of Transportation (Non-Medical): No  Physical Activity: Sufficiently Active   Days of Exercise per Week: 5 days   Minutes of Exercise per Session: 30 min  Stress: No Stress Concern Present   Feeling of Stress : Not at all  Social Connections: Moderately Isolated   Frequency of Communication with Friends and Family: More than three times a week   Frequency of  Social Gatherings with Friends and Family: Three times a week   Attends Religious Services: 1 to 4 times per year   Active Member of Clubs or Organizations: No   Attends Archivist Meetings: Never   Marital Status: Widowed  Human resources officer Violence: Not At Risk   Fear of Current or Ex-Partner: No   Emotionally Abused: No   Physically Abused: No   Sexually Abused: No    FAMILY HISTORY:  Family History  Problem Relation Age of Onset   Heart failure Father    Heart attack Father        Deceased    Thyroid disease Sister     CURRENT MEDICATIONS:  Outpatient Encounter Medications as of 03/13/2021  Medication Sig   acetaminophen (TYLENOL) 325 MG tablet Take 2 tablets (650 mg total) by mouth every 6 (six) hours as needed for headache.   Calcium Carbonate-Vitamin D (CALCIUM-VITAMIN D) 600-125 MG-UNIT TABS Take by mouth.   Cholecalciferol (VITAMIN D3) 25 MCG (1000 UT) CAPS Take by mouth daily.   ferrous sulfate 325 (65 FE) MG EC tablet Take 1 tablet (325 mg total) by mouth daily with breakfast.   levothyroxine (SYNTHROID) 50 MCG tablet Take 1 tablet (50 mcg total) by mouth daily before breakfast.   lidocaine (XYLOCAINE) 2 % jelly Apply to affected area daily prn   magnesium oxide (MAG-OX) 400 (240 Mg) MG tablet Take 1 tablet (400 mg total) by mouth 2 (two) times daily.   midodrine (PROAMATINE) 5 MG tablet Take 1 tablet (5 mg total) by mouth 3 (three) times daily with meals.   omeprazole (PRILOSEC) 10 MG capsule Take 1 capsule by mouth daily.   potassium chloride (KLOR-CON) 10 MEQ tablet Take 10 mEq by mouth 2 (two) times daily.   pramoxine (PROCTOFOAM) 1 % foam SMARTSIG:Rectally Every 2 Hours PRN   predniSONE (DELTASONE) 10 MG tablet Take 1 tablet (10 mg total) by mouth daily with breakfast.   psyllium (REGULOID) 0.52 g capsule Take by mouth.   rosuvastatin (CRESTOR) 5 MG tablet Take 1 tablet by mouth at bedtime.   tamsulosin (FLOMAX) 0.4 MG CAPS capsule Take 1 capsule by mouth  daily.   vitamin B-12 (CYANOCOBALAMIN) 1000 MCG tablet Take 1 tablet (1,000 mcg total) by mouth daily.   warfarin (COUMADIN) 5 MG tablet Take 1 tablet (5 mg total) by mouth every evening.   No facility-administered encounter medications on file as of 03/13/2021.    ALLERGIES:  Allergies  Allergen Reactions   Adhesive [Tape]     Contact dermatitis   Sulfa Antibiotics     UNKNOWN Childhood reaction     PHYSICAL EXAM:  ECOG PERFORMANCE STATUS: 0 - Asymptomatic  There were no vitals filed for this visit. There were no vitals filed for this visit. Physical Exam Constitutional:      Appearance: Normal appearance. He is obese.  HENT:     Head: Normocephalic and atraumatic.     Mouth/Throat:  Mouth: Mucous membranes are moist.  Eyes:     Extraocular Movements: Extraocular movements intact.     Pupils: Pupils are equal, round, and reactive to light.  Cardiovascular:     Rate and Rhythm: Normal rate and regular rhythm.     Pulses: Normal pulses.     Heart sounds: Normal heart sounds.  Pulmonary:     Effort: Pulmonary effort is normal.     Breath sounds: Normal breath sounds.  Abdominal:     General: Bowel sounds are normal.     Palpations: Abdomen is soft.     Tenderness: There is no abdominal tenderness.  Musculoskeletal:        General: No swelling.     Right lower leg: No edema.     Left lower leg: No edema.  Lymphadenopathy:     Head:     Right side of head: No submental, submandibular, tonsillar, preauricular, posterior auricular or occipital adenopathy.     Left side of head: No submental, submandibular, tonsillar, preauricular, posterior auricular or occipital adenopathy.     Cervical: No cervical adenopathy.     Right cervical: No superficial, deep or posterior cervical adenopathy.    Left cervical: No superficial or deep cervical adenopathy.     Upper Body:     Right upper body: No supraclavicular, axillary or pectoral adenopathy.     Left upper body: No  supraclavicular, axillary or pectoral adenopathy.     Lower Body: No right inguinal adenopathy. No left inguinal adenopathy.  Skin:    General: Skin is warm and dry.  Neurological:     General: No focal deficit present.     Mental Status: He is alert and oriented to person, place, and time.  Psychiatric:        Mood and Affect: Mood normal.        Behavior: Behavior normal.     LABORATORY DATA:  I have reviewed the labs as listed.  CBC    Component Value Date/Time   WBC 3.2 (L) 03/08/2021 0811   RBC 4.95 03/08/2021 0811   HGB 15.5 03/08/2021 0811   HCT 45.9 03/08/2021 0811   PLT 90 (L) 03/08/2021 0811   MCV 92.7 03/08/2021 0811   MCH 31.3 03/08/2021 0811   MCHC 33.8 03/08/2021 0811   RDW 14.6 03/08/2021 0811   LYMPHSABS 1.2 03/08/2021 0811   MONOABS 0.1 03/08/2021 0811   EOSABS 0.0 03/08/2021 0811   BASOSABS 0.0 03/08/2021 0811   CMP Latest Ref Rng & Units 02/23/2021 02/21/2021 01/13/2021  Glucose 70 - 99 mg/dL 76 84 79  BUN 8 - 23 mg/dL 19 25(H) 17  Creatinine 0.61 - 1.24 mg/dL 0.66 0.83 0.69  Sodium 135 - 145 mmol/L 137 133(L) 136  Potassium 3.5 - 5.1 mmol/L 4.3 4.1 3.7  Chloride 98 - 111 mmol/L 97(L) 94(L) 99  CO2 22 - 32 mmol/L 32 31 28  Calcium 8.9 - 10.3 mg/dL 9.3 9.1 9.3  Total Protein 6.5 - 8.1 g/dL 6.4(L) 6.9 6.5  Total Bilirubin 0.3 - 1.2 mg/dL 1.4(H) 1.9(H) 2.0(H)  Alkaline Phos 38 - 126 U/L 70 69 50  AST 15 - 41 U/L 77(H) 85(H) 85(H)  ALT 0 - 44 U/L 43 47(H) 19    DIAGNOSTIC IMAGING:  I have independently reviewed the relevant imaging and discussed with the patient.  ASSESSMENT & PLAN: 1.  Lymphoma, unspecified type - Bone marrow biopsy (03/08/2021): Hypercellular bone marrow for age with involvement by B-cell lymphoproliferative disorder characterized by  predominance of medium to large atypical lymphoid cells many of which display a multilobated appearance.  The atypical lymphoid cells show expression for both CD5 and CD10 with lack of cyclin D1, CD34,  TdT or CD30.  The lymphomatous involvement is in the form of interstitial infiltrates and numerous variably sized aggregates with no obvious sinusoidal growth pattern.  Based on the overall features, high-grade process, particularly large cell type is favored. - Flow cytometry (03/08/2021) consistent with involvement by non-Hodgkin B-cell lymphoma - Cytogenetic studies are pending and high-grade B-cell lymphoma FISH panel (high-grade B-cell lymphoma studies are pending -Hepatitis C antibody (July 2022) negative - Beta-2 microglobulin elevated at 4.5. - Echocardiogram (01/09/2021): Left ventricular ejection fraction 55 to 60% - Most recent CBC (03/08/2021): WBC 3.2 with normal differential, platelets 90, Hgb 15.5 - No unexplained fever, chills, night sweats - Patient denies any new lumps or bumps.  Reports that he had some left facial swelling several weeks ago associated with sinus infection, but that this resolved after he was treated with amoxicillin. -Patient reports that he is feeling "really good," and is surprised by his diagnosis of lymphoma - He does report some ongoing unintentional weight loss, which he had attributed to recent hospital stays and "not eating as much as he used to" (lost 20 pounds in the past 2 months) - No palpable lymphadenopathy on exam - Discussed with patient that his diagnosis favors lymphoma, but that specific type and treatment plan will be determined by additional testing - PLAN: Labs today with repeat CBC, CMP, LDH, uric acid, and hepatitis B - Schedule patient for PET scan  - MD visit with Dr. Delton Coombes after the above testing  2.  Thrombocytopenia & leukopenia - Leukopenia and thrombocytopenia first noted on 02/21/2021 - CBC (02/27/2021): WBC 2.3 (ANC 1.4), platelets 64 - Borderline B12 at 217, methylmalonic acid normal - Normal folate 8.8.  Normal copper. - No splenomegaly on recent CT abdomen/pelvis (June 2022) - Hepatitis panel was negative - PLAN: Likely  secondary to marrow infiltration from lymphoma.  Management as above.  Continue B12 supplement.   3.  Abnormal free light chains - MGUS panel (02/21/2021):  SPEP negative for M spike, but elevated kappa light chain 78.6 with lambda 7.3 and elevated free light chain ratio 10.77 - No CRAB criteria at this time with Hgb 15.2, creatinine 0.66, and calcium 9.3 - Bone marrow biopsy (03/08/2021): Plasma cells 1% - Skeletal survey negative for focal lytic lesion - 24-hour urine with UPEP/U IFE negative for monoclonal protein - PLAN: Elevated kappa light chains likely in the setting of lymphoma.  4.  Mild iron deficiency state, without anemia - Iron panel (02/21/2021):  Ferritin 152, iron saturation 17% with TIBC 416 - He denies any abnormal bleeding - PLAN: Continue taking ferrous sulfate 325 mg daily with food and stool softener if needed.  We will recheck iron panel in 3 months (January 2023)   5.  Other history - PMH: Paroxysmal atrial fibrillation on Coumadin, adrenal insufficiency, hyponatremia, hypertension, hyperlipidemia   PLAN SUMMARY & DISPOSITION: PET scan this week and office visit with Dr. Delton Coombes next week  All questions were answered. The patient knows to call the clinic with any problems, questions or concerns.  Medical decision making: Moderate  Time spent on visit: I spent 20 minutes counseling the patient face to face. The total time spent in the appointment was 30 minutes and more than 50% was on counseling.   Harriett Rush, PA-C  03/13/2021 4:18 PM

## 2021-03-15 ENCOUNTER — Telehealth: Payer: Self-pay | Admitting: "Endocrinology

## 2021-03-15 NOTE — Telephone Encounter (Signed)
Patient called and stated that his blood pressure was 142/67 yesterday around 1pm and right now it is 97/62. Please Advise

## 2021-03-16 ENCOUNTER — Encounter (HOSPITAL_COMMUNITY)
Admission: RE | Admit: 2021-03-16 | Discharge: 2021-03-16 | Disposition: A | Discharge status: Home / Self Care | Payer: Medicare Other | Source: Ambulatory Visit | Attending: Physician Assistant | Admitting: Physician Assistant

## 2021-03-16 ENCOUNTER — Other Ambulatory Visit: Payer: Self-pay

## 2021-03-16 ENCOUNTER — Other Ambulatory Visit: Payer: Self-pay | Admitting: "Endocrinology

## 2021-03-16 DIAGNOSIS — C8590 Non-Hodgkin lymphoma, unspecified, unspecified site: Secondary | ICD-10-CM | POA: Diagnosis not present

## 2021-03-16 MED ORDER — FLUDEOXYGLUCOSE F - 18 (FDG) INJECTION
12.1890 | Freq: Once | INTRAVENOUS | Status: AC | PRN
  Administered 2021-03-16: 12.189 via INTRAVENOUS

## 2021-03-16 MED ORDER — MIDODRINE HCL 5 MG PO TABS: 5.0000 mg | ORAL_TABLET | Freq: Two times a day (BID) | ORAL | 2 refills | Status: DC

## 2021-03-16 NOTE — Telephone Encounter (Signed)
Dr Dorris Fetch,  Called pt, he asked should he take one in the morning and one at night?

## 2021-03-16 NOTE — Telephone Encounter (Signed)
Joy, Could you call and speak with pt

## 2021-03-16 NOTE — Telephone Encounter (Signed)
Pt stated he did experiencing dizziness and was lightheaded. He stated he did take a dose of midodrine and it helped, BP increased to 108/60. Requested a Rx refill for midodrine.

## 2021-03-16 NOTE — Telephone Encounter (Signed)
Patient made aware.

## 2021-03-17 ENCOUNTER — Encounter (HOSPITAL_COMMUNITY): Payer: Self-pay | Admitting: Hematology

## 2021-03-20 ENCOUNTER — Encounter (HOSPITAL_COMMUNITY): Payer: Self-pay | Admitting: Hematology

## 2021-03-21 NOTE — Progress Notes (Signed)
[place], [place] [address]   CLINIC:  Medical Oncology/Hematology  PCP:  Bridget Hartshorn, NP [place] / [place] [address]  [phone]  REASON FOR VISIT:  Follow-up for leukopenia and thrombocytopenia   PRIOR THERAPY: none  CURRENT THERAPY: under work-up  INTERVAL HISTORY:  [name], a 70 y.o. male, returns for routine follow-up for his leukopenia and thrombocytopenia. Tayvian was last seen on 03/13/2021 by Tarri Abernethy, PA-C.  Today he reports feeling good. He reports a sinus infection 1 month ago which was treated with amoxicillin. He reports constipation, and his last BM was 11/10. He is currently taking iron tablets. He has lost 46 pounds over the past 4 months. He reports fatigue, but he denies fevers and night sweats. His appetite is good. He reports constant numbness and stinging in his fingers and feet. He reports SOB, and he denies cough. He currently lives at home by himself, and he is able to do all of his typical activities unassisted. He denies smoking history. 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 He denies pesticide exposure. One of his brother had bone cancer, and another brother had lung cancer and was a smoker.  REVIEW OF SYSTEMS:  Review of Systems  Constitutional:  Positive for fatigue (75%) and unexpected weight change (-46 lbs). Negative for appetite change and fever.  Respiratory:  Positive for shortness of breath. Negative for cough.   Gastrointestinal:  Positive for constipation.  Neurological:  Positive for numbness (feet).  All other systems reviewed and are negative.  PAST MEDICAL/SURGICAL HISTORY:  Past Medical History:  Diagnosis Date   Atrial fibrillation (HCC)    GERD (gastroesophageal reflux disease)    High cholesterol    Hypertension    Obesity    Past Surgical History:  Procedure Laterality Date   APPENDECTOMY     CATARACT EXTRACTION Bilateral 2022    CHOLECYSTECTOMY     ESOPHAGEAL DILATION     multiple times   JOINT REPLACEMENT Left    hip    SOCIAL HISTORY:  Social History   Socioeconomic History   Marital status: Widowed    Spouse name: Not on file   Number of children: 3   Years of education: Not on file   Highest education level: Not on file  Occupational History   Occupation: Retired  Tobacco Use   Smoking status: Never   Smokeless tobacco: Never  Vaping Use   Vaping Use: Never used  Substance and Sexual Activity   Alcohol use: No   Drug use: No   Sexual activity: Not Currently  Other Topics Concern   Not on file  Social History Narrative   Not on file   Social Determinants of Health   Financial Resource Strain: Low Risk    Difficulty of Paying Living Expenses: Not hard at all  Food Insecurity: No Food Insecurity   Worried About Charity fundraiser in the Last Year: Never true   [place] in the Last Year: Never true  Transportation Needs: No Transportation Needs   Lack of Transportation (Medical): No   Lack of Transportation (Non-Medical): No  Physical Activity: Sufficiently Active   Days of Exercise per Week: 5 days   Minutes of Exercise per Session: 30 min  Stress: No Stress Concern Present   Feeling of Stress : Not at all  Social Connections: Moderately Isolated   Frequency of Communication with Friends and Family:  More than three times a week   Frequency of Social Gatherings with Friends and Family: Three times a week   Attends Religious Services: 1 to 4 times per year   Active Member of Clubs or Organizations: No   Attends Archivist Meetings: Never   Marital Status: Widowed  Human resources officer Violence: Not At Risk   Fear of Current or Ex-Partner: No   Emotionally Abused: No   Physically Abused: No   Sexually Abused: No    FAMILY HISTORY:  Family History  Problem Relation Age of Onset   Heart failure Father    Heart attack Father        Deceased    Thyroid disease Sister      CURRENT MEDICATIONS:  Current Outpatient Medications  Medication Sig Dispense Refill   acetaminophen (TYLENOL) 325 MG tablet Take 2 tablets (650 mg total) by mouth every 6 (six) hours as needed for headache. 30 tablet 1   Calcium Carbonate-Vitamin D (CALCIUM-VITAMIN D) 600-125 MG-UNIT TABS Take by mouth.     Cholecalciferol (VITAMIN D3) 25 MCG (1000 UT) CAPS Take by mouth daily.     ferrous sulfate 325 (65 FE) MG EC tablet Take 1 tablet (325 mg total) by mouth daily with breakfast. 30 tablet 3   levothyroxine (SYNTHROID) 50 MCG tablet Take 1 tablet (50 mcg total) by mouth daily before breakfast. 90 tablet 1   lidocaine (XYLOCAINE) 2 % jelly Apply to affected area daily prn     magnesium oxide (MAG-OX) 400 (240 Mg) MG tablet Take 1 tablet (400 mg total) by mouth 2 (two) times daily. 60 tablet 1   midodrine (PROAMATINE) 5 MG tablet Take 1 tablet (5 mg total) by mouth 2 (two) times daily with a meal. 60 tablet 2   omeprazole (PRILOSEC) 10 MG capsule Take 1 capsule by mouth daily.     potassium chloride (KLOR-CON) 10 MEQ tablet Take 10 mEq by mouth 2 (two) times daily.     predniSONE (DELTASONE) 10 MG tablet Take 1 tablet (10 mg total) by mouth daily with breakfast. 95 tablet 1   psyllium (REGULOID) 0.52 g capsule Take by mouth.     rosuvastatin (CRESTOR) 5 MG tablet Take 1 tablet by mouth at bedtime.     tamsulosin (FLOMAX) 0.4 MG CAPS capsule Take 1 capsule by mouth daily.     vitamin B-12 (CYANOCOBALAMIN) 1000 MCG tablet Take 1 tablet (1,000 mcg total) by mouth daily. 30 tablet 3   warfarin (COUMADIN) 5 MG tablet Take 1 tablet (5 mg total) by mouth every evening.     No current facility-administered medications for this visit.    ALLERGIES:  Allergies  Allergen Reactions   Adhesive [Tape]     Contact dermatitis   Sulfa Antibiotics     UNKNOWN Childhood reaction    PHYSICAL EXAM:  Performance status (ECOG): 0 - Asymptomatic  Vitals:   03/22/21 1001  BP: 117/81  Pulse: 90   Resp: 18  Temp: 98.6 F (37 C)  SpO2: 99%   Wt Readings from Last 3 Encounters:  03/22/21 232 lb 4.8 oz (105.4 kg)  03/13/21 233 lb 3.2 oz (105.8 kg)  03/08/21 230 lb 3.2 oz (104.4 kg)   Physical Exam Vitals reviewed.  Constitutional:      Appearance: Normal appearance.  Cardiovascular:     Rate and Rhythm: Normal rate and regular rhythm.     Pulses: Normal pulses.     Heart sounds: Normal heart sounds.  Pulmonary:  Effort: Pulmonary effort is normal.     Breath sounds: Normal breath sounds.  Abdominal:     Palpations: Abdomen is soft. There is splenomegaly (tip palpable 4 fingerbreadths below costal margin). There is no hepatomegaly or mass.     Tenderness: There is no abdominal tenderness.  Lymphadenopathy:     Cervical: No cervical adenopathy.     Right cervical: No superficial cervical adenopathy.    Left cervical: No superficial cervical adenopathy.  Neurological:     General: No focal deficit present.     Mental Status: He is alert and oriented to person, place, and time.  Psychiatric:        Mood and Affect: Mood normal.        Behavior: Behavior normal.    LABORATORY DATA:  I have reviewed the labs as listed.  CBC Latest Ref Rng & Units 03/13/2021 03/08/2021 02/27/2021  WBC 4.0 - 10.5 K/uL 2.9(L) 3.2(L) 2.3(L)  Hemoglobin 13.0 - 17.0 g/dL 14.4 15.5 15.2  Hematocrit 39.0 - 52.0 % 43.1 45.9 47.0  Platelets 150 - 400 K/uL 73(L) 90(L) 64(L)   CMP Latest Ref Rng & Units 03/13/2021 02/23/2021 02/21/2021  Glucose 70 - 99 mg/dL 107(H) 76 84  BUN 8 - 23 mg/dL 20 19 25(H)  Creatinine 0.61 - 1.24 mg/dL 0.95 0.66 0.83  Sodium 135 - 145 mmol/L 134(L) 137 133(L)  Potassium 3.5 - 5.1 mmol/L 4.3 4.3 4.1  Chloride 98 - 111 mmol/L 96(L) 97(L) 94(L)  CO2 22 - 32 mmol/L 28 32 31  Calcium 8.9 - 10.3 mg/dL 9.4 9.3 9.1  Total Protein 6.5 - 8.1 g/dL 6.5 6.4(L) 6.9  Total Bilirubin 0.3 - 1.2 mg/dL 1.6(H) 1.4(H) 1.9(H)  Alkaline Phos 38 - 126 U/L 90 70 69  AST 15 - 41 U/L  76(H) 77(H) 85(H)  ALT 0 - 44 U/L 59(H) 43 47(H)      Component Value Date/Time   RBC 4.53 03/13/2021 1436   MCV 95.1 03/13/2021 1436   MCH 31.8 03/13/2021 1436   MCHC 33.4 03/13/2021 1436   RDW 15.2 03/13/2021 1436   LYMPHSABS 1.0 03/13/2021 1436   MONOABS 0.1 03/13/2021 1436   EOSABS 0.1 03/13/2021 1436   BASOSABS 0.0 03/13/2021 1436    DIAGNOSTIC IMAGING:  I have independently reviewed the scans and discussed with the patient. NM PET Image Initial (PI) Skull Base To Thigh  Result Date: 03/17/2021 CLINICAL DATA:  Initial treatment strategy for lymphoma. Non-Hodgkin's lymphoma. EXAM: NUCLEAR MEDICINE PET SKULL BASE TO THIGH TECHNIQUE: 12.2 mCi F-18 FDG was injected intravenously. Full-ring PET imaging was performed from the skull base to thigh after the radiotracer. CT data was obtained and used for attenuation correction and anatomic localization. Fasting blood glucose: 66 mg/dl COMPARISON:  CT 10/21/2020 FINDINGS: Mediastinal blood pool activity: SUV max 1.6 Liver activity: SUV max NA NECK: No hypermetabolic lymph nodes in the neck. Incidental CT findings: none CHEST: No hypermetabolic mediastinal lymph nodes. There is diffuse ground-glass opacities within the lungs without discrete nodularity. This diffuse ground-glass density has moderate metabolic activity. For example in the RIGHT lower lobe SUV max equal 3.1 (image 96). Incidental CT findings: none ABDOMEN/PELVIS: Spleen is enlarged and hypermetabolic with SUV max equal 9.9. Spleen measures 17.9 cm in craniocaudad dimension. No hypermetabolic abdominopelvic lymph nodes. No hypermetabolic or enlarged inguinal nodes. Incidental CT findings: none SKELETON: There is diffuse activity within the marrow space of the spine. For example SUV max equal 5.2 at the L5 vertebral body level. No focal  activity. Incidental CT findings: LEFT hip prosthetic. IMPRESSION: 1. Enlarged hypermetabolic spleen consistent with lymphoma involvement. 2. No  hypermetabolic lymph nodes in the chest abdomen pelvis. 3. Diffuse hypermetabolic activity within the marrow space of the spine is concerning for lymphoma involvement the vertebral bodies. 4. Diffuse ground-glass density within lungs with moderate metabolic activity. In the absence of drug treatment, findings concern for pulmonary infection versus lymphoma involvement. Electronically Signed   By: Suzy Bouchard M.D.   On: 03/17/2021 15:14   DG Bone Survey Met  Result Date: 03/01/2021 CLINICAL DATA:  Monoclonal gammopathy of unknown significance EXAM: METASTATIC BONE SURVEY COMPARISON:  None. FINDINGS: No focal lytic lesions are identified. There is diffuse ankylosis of the cervical, thoracic, and lumbar spine with fine syndesmophytes and ossification of the inter spinous ligaments in keeping with changes of ankylosing spondylitis. There is ankylosis of the sacroiliac joints bilaterally. Mild left glenohumeral degenerative arthritis. Cholecystectomy and left total hip arthroplasty has been performed. IMPRESSION: No focal lytic lesion identified. Findings in keeping with ankylosing spondylitis. Electronically Signed   By: Fidela Salisbury M.D.   On: 03/01/2021 20:38   CT BONE MARROW BIOPSY & ASPIRATION  Result Date: 03/08/2021 INDICATION: MGUS, unexplained hemolysis, unexplained thrombocytopenia AND leukopenia EXAM: CT BONE MARROW BIOPSY AND ASPIRATION MEDICATIONS: None. ANESTHESIA/SEDATION: Moderate (conscious) sedation was employed during this procedure. A total of Versed 1 mg and Fentanyl 50 mcg was administered intravenously. Moderate Sedation Time: 10 minutes. The patient's level of consciousness and vital signs were monitored continuously by radiology nursing throughout the procedure under my direct supervision. FLUOROSCOPY TIME:  N/a COMPLICATIONS: None immediate. PROCEDURE: Informed written consent was obtained from the patient after a thorough discussion of the procedural risks, benefits and  alternatives. All questions were addressed. Maximal Sterile Barrier Technique was utilized including caps, mask, sterile gowns, sterile gloves, sterile drape, hand hygiene and skin antiseptic. A timeout was performed prior to the initiation of the procedure. The patient was placed prone on the CT exam table. Limited CT of the pelvis was performed for planning purposes. Skin entry site was marked, and the overlying skin was prepped and draped in the standard sterile fashion. Local analgesia was obtained with 1% lidocaine. Using CT guidance, an 11 gauge needle was advanced just deep to the cortex of the right posterior ilium. Subsequently, bone marrow aspiration and core biopsy were performed. Specimens were submitted to lab/pathology for handling. Hemostasis was achieved with manual pressure, and a clean dressing was placed. The patient tolerated the procedure well without immediate complication. IMPRESSION: Successful CT-guided bone marrow aspiration and core biopsy of the right posterior ilium. Electronically Signed   By: Albin Felling M.D.   On: 03/08/2021 10:20     ASSESSMENT:  1.  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  High-grade lymphoma panel was negative. - Weight loss of 40 pounds in the last 4 months, stable weight over the last 1 month.  No fevers or night sweats.    2.  Social/family history: - He lives at home by himself.  His son is present today with him. - He is independent of ADLs and IADLs.  Son lives within 30 minutes.  He was never smoker. - He has work-related exposure to dyes.  No pesticide exposure. - Brother died of bone cancer.  Another brother also died of bone cancer.    PLAN:  1.  Diffuse large B-cell lymphoma, stage IVb: - Weight loss of 40 pounds in the last 4 months, stable over the last 1 month.   Denies any night sweats or fevers. - Reviewed bone marrow biopsy results from 03/08/2021 which showed mild to moderate involvement by B-cell lymphoproliferative process. - High-grade lymphoma panel was negative. - Reviewed PET CT scan from 03/16/2021 which showed hypermetabolic enlarged spleen.  There are hypermetabolic neck lymph nodes.  Diffuse hypermetabolic activity within the marrow space of the spine.  Diffuse groundglass density within the lungs with moderate activity. - He has intermittent dyspnea on exertion.  Denies any cough or expectoration. - Reported treatment with amoxicillin for sinus infection a month ago.  Appetite has been good. - Recommend right neck lymph node biopsy by ENT. - Recommend port placement. - Hepatitis B panel was negative.  We will check hepatitis C antibodies. - RTC 3 weeks to initiate R-CHOP regimen.  We discussed regimen and side effects in detail.    2.  Hyperuricemia: - We will start him on allopurinol 300 mg daily as uric acid elevated at 9.2.    3.  Peripheral neuropathy: - He reports feet being numb with occasional stinging sensation. - We will start him on gabapentin 100 mg twice daily.  4.  High risk drug monitoring: - Reviewed echo from 01/09/2021 with EF 55-60%.  5.  Severe constipation: - He reports taking Dulcolax laxative and stool softener.  He is not having success. - We will start him on lactulose 30 mL every 3 hours until bowel movement followed by maintenance lactulose 30 mL 1-2 times daily.      Orders placed this encounter:  No orders of the defined types were placed in this encounter.    Derek Jack, MD [place] [phone]   I, Thana Ates, am acting as a scribe for Dr. Derek Jack.  I, Derek Jack MD, have reviewed the above documentation for accuracy and completeness, and I agree with the above.

## 2021-03-22 ENCOUNTER — Other Ambulatory Visit: Payer: Self-pay

## 2021-03-22 ENCOUNTER — Encounter (HOSPITAL_COMMUNITY): Payer: Self-pay | Admitting: Hematology

## 2021-03-22 ENCOUNTER — Encounter (HOSPITAL_COMMUNITY): Payer: Self-pay

## 2021-03-22 ENCOUNTER — Other Ambulatory Visit (HOSPITAL_COMMUNITY): Payer: Medicare Other

## 2021-03-22 ENCOUNTER — Inpatient Hospital Stay (HOSPITAL_COMMUNITY): Payer: Medicare Other | Admitting: Hematology

## 2021-03-22 VITALS — BP 117/81 | HR 90 | Temp 98.6°F | Resp 18 | Wt 232.3 lb

## 2021-03-22 DIAGNOSIS — C8330 Diffuse large B-cell lymphoma, unspecified site: Secondary | ICD-10-CM | POA: Diagnosis not present

## 2021-03-22 DIAGNOSIS — C8338 Diffuse large B-cell lymphoma, lymph nodes of multiple sites: Secondary | ICD-10-CM | POA: Diagnosis not present

## 2021-03-22 DIAGNOSIS — C8590 Non-Hodgkin lymphoma, unspecified, unspecified site: Secondary | ICD-10-CM | POA: Diagnosis not present

## 2021-03-22 DIAGNOSIS — C8510 Unspecified B-cell lymphoma, unspecified site: Secondary | ICD-10-CM | POA: Insufficient documentation

## 2021-03-22 MED ORDER — GABAPENTIN 100 MG PO CAPS: 100.0000 mg | ORAL_CAPSULE | Freq: Two times a day (BID) | ORAL | 3 refills | Status: DC

## 2021-03-22 MED ORDER — LACTULOSE 20 GM/30ML PO SOLN: ORAL | 2 refills | Status: DC

## 2021-03-22 MED ORDER — ALLOPURINOL 300 MG PO TABS: 300.0000 mg | ORAL_TABLET | Freq: Every day | ORAL | 3 refills | Status: DC

## 2021-03-22 NOTE — Patient Instructions (Addendum)
[place] [facility] Discharge Instructions  You were seen and examined today by Dr. Delton Coombes.   He reviewed the results of your bone marrow biopsy and PET CT.  We will refer you to an Ear, Nose, and Throat (ENT) specialist (Dr. Benjamine Mola) to have the lymph node on the right side of your neck biopsied.  We will refer you for a port a cath placement in order to administer your chemotherapy.  Prescriptions were sent for gabapentin for nerve pain and allopurinol to help keep your uric acid in the normal range.        Thank you for choosing [place] at [facility] to provide your oncology and hematology care.  To afford each patient quality time with our provider, please arrive at least 15 minutes before your scheduled appointment time.   If you have a lab appointment with the [facility] please come in thru the Main Entrance and check in at the main information desk.  You need to re-schedule your appointment should you arrive 10 or more minutes late.  We strive to give you quality time with our providers, and arriving late affects you and other patients whose appointments are after yours.  Also, if you no show three or more times for appointments you may be dismissed from the clinic at the providers discretion.     Again, thank you for choosing [facility].  Our hope is that these requests will decrease the amount of time that you wait before being seen by our physicians.       _____________________________________________________________  Should you have questions after your visit to [facility], please contact our office at [phone] and follow the prompts.  Our office hours are 8:00 a.m. and 4:30 p.m. Monday - Friday.  Please note that voicemails left after 4:00 p.m. may not be returned until the following business day.  We are closed weekends and major holidays.  You do have access to a nurse 24-7, just  call the main number to the clinic [phone] and do not press any options, hold on the line and a nurse will answer the phone.    For prescription refill requests, have your pharmacy contact our office and allow 72 hours.    Due to Covid, you will need to wear a mask upon entering the hospital. If you do not have a mask, a mask will be given to you at the Main Entrance upon arrival. For doctor visits, patients may have 1 support person age 36 or older with them. For treatment visits, patients can not have anyone with them due to social distancing guidelines and our immunocompromised population.

## 2021-03-22 NOTE — Progress Notes (Signed)

## 2021-03-22 NOTE — Progress Notes (Signed)
Provided patient with written information on R-CHOP as recommended my Dr. Delton Coombes. Patient and family encouraged to call with questions or concerns.

## 2021-03-23 NOTE — Progress Notes (Signed)
Please note addendum to PET scan results.

## 2021-03-24 ENCOUNTER — Telehealth (HOSPITAL_COMMUNITY): Payer: Self-pay | Admitting: *Deleted

## 2021-03-24 ENCOUNTER — Other Ambulatory Visit: Payer: Self-pay | Admitting: Otolaryngology

## 2021-03-24 ENCOUNTER — Other Ambulatory Visit (HOSPITAL_COMMUNITY): Payer: Self-pay | Admitting: Otolaryngology

## 2021-03-24 DIAGNOSIS — K118 Other diseases of salivary glands: Secondary | ICD-10-CM

## 2021-03-24 NOTE — Telephone Encounter (Signed)
Patient called concerning stomach pain with Gabapentin.  Spoke with the PA, and  she recommended patient either continue taking the medication for another week and see if symptoms subside or he can stop taking the medication and address his concerns with Dr. Delton Coombes at his next appointment.  Called to notify patient and left a detailed message and number he could call with questions.

## 2021-03-27 NOTE — Patient Instructions (Signed)
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  You will be treated in the clinic every 3 weeks (for 6 cycles) with a combination of chemotherapy drugs, and an immunotherapy drug.  The intent of treatment is cure.  You will see the doctor regularly throughout treatment.  We will obtain blood work from you prior to every treatment and monitor your results to make sure it is safe to give your treatment. The doctor monitors your response to treatment by the way you are feeling, your blood work, and by obtaining scans periodically.  There will be wait times while you are here for treatment.  It will take about 30 minutes to 1 hour for your lab work to result.  Then there will be wait times while pharmacy mixes your medications.    R-CHOP is the acronym for the combination of chemotherapy drugs and other drugs you will receive for your treatment.  The drugs this regimen includes are:  Rituxan (rituximab); Cytoxan (cyclophosphamide); Oncovin (vincristine); Adriamycin (doxorubicin HCl); and prednisone (not a chemo drug).  Medications you will receive in the clinic prior to your chemotherapy medications:  Aloxi:  ALOXI is used in adults to help prevent nausea and vomiting that happens with certain chemotherapy drugs.  Aloxi is a long acting medication, and will remain in your system for about two days.   Emend:  This is an anti-nausea medication that is used with Aloxi to help prevent nausea and vomiting caused by chemotherapy.  Dexamethasone:  This is a steroid given prior to chemotherapy to help prevent allergic reactions; it may also help prevent and control nausea and diarrhea.   Tylenol:  Given to prevent infusion reactions to rituximab.  Benadryl:  Antihistamine given to prevent allergic/infusion reactions to rituximab.    *Neulasta (or similar) - this medication is not chemotherapy but is being  given because you have had chemo. It is usually given 24-48 hours after the completion of chemotherapy. This medication works by boosting your bone marrow's supply of white blood cells. White blood cells are what protect our bodies against infection. The medication is given in the form of a subcutaneous injection. It is given in the fatty tissue of your abdomen, or in the skin on the back of your arm . It is a short needle. The major side effect of this medication is bone or muscle pain. The drug of choice to relieve or lessen the pain is Aleve or Ibuprofen. If a physician has ever told you not to take Aleve or Ibuprofen - then don't take it. You should then take Tylenol/acetaminophen. Take either medication as the bottle directs you to.  The level of pain you experience as a result of this injection can range from none, to mild or moderate, or severe. Please let us know if you develop moderate or severe bone pain.   You can take Claritin 10 mg over the counter for a few days after receiving Neulasta to help with the bone aches and pains.       Doxorubicin (Generic Name) Other Names: Adriamycin, hydroxyl daunorubicin  About This Drug  Doxorubicin is a drug used to treat cancer. This drug is given in the vein (IV).  This is an IV push that will take about 5-10 minutes.  Possible Side Effects (More Common)   Bone marrow depression. This is a decrease in the number of white blood cells, red blood cells, and  platelets. This may raise your risk of infection, make you tired and weak (fatigue), and raise your risk of bleeding.   Hair loss: Hair loss is often complete scalp hair loss and can involve loss of eyebrows, eyelashes, and pubic hair. You may notice this a few days or weeks after treatment has started. Most often hair loss is temporary; your hair should grow back when treatment is done.   Nausea and throwing up (vomiting). These symptoms may happen within a few hours after your treatment and may  last up to 24 hours. Medicines are available to stop or lessen these side effects.   Soreness of the mouth and throat. You may have red areas, white patches, or sores that hurt.   Change in the color of your urine to pink or red. This color change will go away in one to two days.   Effects on the heart: This drug can weaken the heart and lower heart function. Your heart function will be checked as needed. You may have trouble catching your breath, mainly during activities.  You may also have trouble breathing while lying down, and have swelling in your ankles.   Sensitivity to light (photosensitivity). Photosensitivity means that you may become more sensitive to the effects of the sun, sun lamps, and tanning beds. Your eyes may water more, mostly in bright light.   Metallic taste in the mouth: This may change the taste of food and drinks   Decreased appetite (decreased hunger)   Darkening of the skin or nails   Weakness that interferes with your daily activities   Possible Side Effects (Less Common)   Skin and tissue irritation may involve redness, pain, warmth, or swelling at the IV site. This happens if the drug leaks out of the vein and into nearby tissue.   Changes in your liver function. Your doctor will check your liver function as needed.   This drug may cause an increased risk of developing a second cancer  Allergic Reaction  Serious allergic reactions, including anaphylaxis are rare. While you are getting this drug in your vein (IV), tell your nurse right away if you have any of these symptoms of an allergic reaction:   Trouble catching your breath   Feeling like your tongue or throat are swelling   Feeling your heart beat quickly or in a not normal way (palpitations)   Feeling dizzy or lightheaded   Flushing, itching, rash, and/or hives   Treating Side Effects   Drink 6-8 cups of fluids every day unless your doctor has told you to limit your fluid intake due to  some other health problem. A cup is 8 ounces of fluid. If you vomit or have diarrhea, you should drink more fluids so that you do not become dehydrated (lack water in the body due to losing too much fluid).   Ask your doctor or nurse about medicine that is available to help stop or lessen nausea, throwing up, and/or loose bowel movements   Wear dark sunglasses and use sunscreen with SPF 30 or higher when you are outdoors even for a short time. Cover up when you are out in the sun. Wear wide-brimmed hats, long-sleeved shirts, and pants. Keep your neck, chest, and back covered.   Mouth care is very important. Your mouth care should consist of routine, gentle cleaning of your teeth or dentures and rinsing your mouth with a mixture of 1/2 teaspoon of salt in 8 ounces of water or  teaspoon of baking  soda in 8 ounces of water. This should be done at least after each meal and at bedtime.   If you have mouth sores, avoid mouthwash that has alcohol. Avoid alcohol and smoking because they can bother your mouth and throat.   Talk with your nurse about getting a wig before you lose your hair. Also, call the [facility] at 800-ACS-2345 to find out information about the "Look Good, Feel Better" program close to where you live. It is a free program where women getting chemotherapy can learn about wigs, turbans and scarves as well as makeup techniques and skin and nail care.   While you are getting this drug, please tell your nurse right away if you have any pain, redness, or swelling at the site of the IV infusion.   Food and Drug Interactions  There are no known interactions of doxorubicin with food. This drug may interact with other medicines. Tell your doctor and pharmacist about all the medicines and dietary supplements (vitamins, minerals, herbs and others) that you are taking at this time. The safety and use of dietary supplements and alternative diets are often not known. Using these might  affect your cancer or interfere with your treatment. Until more is known, you should not use dietary supplements or alternative diets without your cancer doctor's help.   When to Call the Doctor  Call your doctor or nurse right away if you have any of these symptoms:   Fever of 100.4 F (38 C) or above   Chills   Easy bruising or bleeding   Wheezing or trouble breathing   Rash or itching   Feeling dizzy or lightheaded   Feeling that your heart is beating in a fast or not normal way (palpitations)   Loose bowel movements (diarrhea) more than 4 times a day or diarrhea with weakness or feeling lightheaded   Nausea that stops you from eating or drinking   Throwing up/vomiting   Signs of liver problems: dark urine, pale bowel movements, bad stomach pain, feeling very tired and weak, unusual itching, or yellowing of the eyes or skin,   During the IV infusion, if you have pain, redness, or swelling at the site of the IV infusion, please tell your nurse right away   Call your doctor or nurse as soon as possible if any of these symptoms happen:   Decreased urine   Pain in your mouth or throat that makes it hard to eat or drink   Nausea and throwing up that is not relieved by prescribed medicines   Rash that is not relieved by prescribed medicines   Swelling of legs, ankles, or feet   Weight gain of 5 pounds in one week (fluid retention)   Lasting loss of appetite or rapid weight loss of five pounds in a week   Fatigue that interferes with your daily activities   Extreme weakness that interferes with normal activities   Sexual Problems and Reproduction Concerns   Infertility warning: Sexual problems and reproduction concerns may happen. In both men and women, this drug may affect your ability to have children. This cannot be determined before your treatment. Talk with your doctor or nurse if you plan to have children. Ask for information on sperm or egg banking.   In  men, this drug may interfere with your ability to make sperm, but it should not change your ability to have sexual relations.   In women, menstrual bleeding may become irregular or stop while you  are getting this drug. Do not assume that you cannot become pregnant if you do not have a menstrual period.   Women may go through signs of menopause (change of life) like vaginal dryness or itching. Vaginal lubricants can be used to lessen vaginal dryness, itching, and pain during sexual relations.   Genetic counseling is available for you to talk about the effects of this drug therapy on future pregnancies. Also, a genetic counselor can look at the possible risk of problems in the unborn baby due to this medicine if an exposure happens during pregnancy.   Pregnancy warning: This drug may have harmful effects on the unborn baby, so effective methods of birth control should be used by you and your partner during your cancer treatment and for at least 6 months after treatment   Breast feeding warning: Women should not breast feed during treatment because this drug could enter the breast milk and badly harm a breast feeding baby.   Vincristine sulfate (Generic Name) Other Names: Oncovin, Vincasar, VCR  About This Drug  Vincristine is a drug used to treat cancer. It is given in the vein (IV).  This drug will take 10 minutes to infuse.   Possible Side Effects (More Common)   Constipation (not able to move bowels)   Hair loss. You may notice hair getting thin. Some patients lose their hair. Your hair often grows back when treatment is done. Most often hair loss is temporary; your hair should grow back when treatment is done.   Effects on the nerves are called peripheral neuropathy. You may feel numbness, tingling, or pain in your hands and feet. It may be hard for you to button your clothes, open jars, or walk as usual. The effect on the nerves may get worse with more doses of the drug. These  effects get better in some people after the drug is stopped but it does not get better in all people.   Possible Side Effects (Less Common)   Soreness of the mouth and throat. You may have red areas, white patches, or sores that hurt.   Swelling of your legs, ankles, and/or feet   Skin and tissue irritation may involve redness, pain, warmth, or swelling at the IV site. This happens if the drug leaks out of the vein and into nearby tissue.   Blood pressure changes. Some patients have low blood pressure and some patients have high blood pressure.   Jaw pain   Hoarseness   Blurred or double vision or other changes in eyesight may happen but are a rare side effect.   Feeling dizzy or lightheaded   Drooping eyelids are a rare side effect.   Depression   Rash   Bone marrow depression. This is a decrease in the number of white blood cells, red blood cells, and platelets. This may raise your risk of infection, make you tired and weak (fatigue), and raise your risk of bleeding.   Chest pain or symptoms of a heart attack. Most heart attacks involve pain in the center of the chest that lasts more than a few minutes. The pain may go away and come back or it can be constant. It can feel like pressure, squeezing, fullness, or pain. Sometimes pain is felt in one or both arms, the back, neck, jaw, or stomach. If any of these symptoms last 2 minutes, call 911.   Trouble sleeping   Lack of appetite; weight loss   Allergic Reactions  Serious allergic reactions including anaphylaxis  are rare. While you are getting this drug in your vein (IV), tell your nurse right away if you have any of these symptoms of an allergic reaction:   Trouble catching your breath   Feeling like your tongue or throat are swelling   Feeling your heart beat quickly or in a not normal way (palpitations)   Feeling dizzy or lightheaded   Flushing, itching, rash, and/or hives   Treating Side Effects   While you  are getting this drug in your IV, please tell your nurse right away if you have any pain, redness, or swelling at the site of the IV infusion.   Ask your doctor or nurse how to prevent or lessen constipation. Constipation can become a serious side effect. If you are constipated, check with your doctor or nurse before you use enemas, laxatives, or suppositories.   Drink 6-8 cups of fluids each day unless your doctor has told you to limit your fluid intake due to some other health problem. A cup is 8 ounces of fluid. If you throw up or have loose bowel movements, you should drink more fluids so that you do not become dehydrated (lack water in the body from losing too much fluid).   Mouth care is very important. Your mouth care should consist of gently cleaning your teeth or dentures and rinsing your mouth with a mixture of  teaspoon salt in 8 ounces of water or  teaspoon sodium bicarbonate (baking soda) in 8 ounces of water. This should be done at least after each meal and at bedtime.   If you have mouth sores, avoid mouthwash that has alcohol. Also avoid alcohol and smoking because they can bother your mouth and throat.   If you get a rash do not put anything on it unless your doctor or nurse says you may. Keep the area around the rash clean and dry. Ask your doctor for medicine if your rash bothers you.   If you have numbness and tingling in your hands and feet, be careful when cooking, walking, and handling sharp objects and hot liquids.   Talk with your nurse about getting a wig before you lose your hair. Also, call the [facility] at 800-ACS-2345 to find out information about the "Look Good, Feel Better" program close to where you live. It is a free program where women getting chemotherapy can learn about wigs, turbans and scarves as well as makeup techniques and skin and nail care.   Food and Drug Interactions   There are known interactions of Vincristine with grapefruit. Do not  eat grapefruit or drink grapefruit juice while taking this drug.   Check with your doctor or pharmacist about all other prescription medicines and dietary supplements you are taking before starting this medicine as there are a lot of known drug interactions with Vincristine. Also, check with your doctor or pharmacist before starting any new prescription, over-the-counter medicines, or dietary supplement to make sure that there are no interactions.   When to Call the Doctor  Call your doctor or nurse right away if you have any of these symptoms:   Redness, pain, warmth, or swelling at the IV site while you are getting this drug   No bowel movement in 3 days or when you feel uncomfortable.   Abdominal pain   Fever of 100.4 F (38 C) or higher   Chills   Feeling short of breath   Easy bleeding or bruising   Jaw pain   Hoarseness  Drooping eyelids   Blurred vision or other changes in eyesight   Feeling confused, restless, or irritable   Seizures (Common symptoms of a seizure can include confusion, blacking out, passing out, loss of hearing or vision, blurred vision, unusual smells or tastes (such as burning rubber), trouble talking, tremors or shaking in parts or all of the body, repeated body movements, tense muscles that do not relax, and loss of control of urine and bowels. There are other less common symptoms of seizures. If you or your family member suspects you are having a seizure, call 911 right away).   Hallucinations (seeing, hearing, or feeling things that are not really there)   Feeling   Rash   Chest pain or symptoms of a heart attack. Most heart attacks involve pain in the center of the chest that lasts more than a few minutes. The pain may go away and come back. It can feel like pressure, squeezing, fullness, or pain. Sometimes pain is felt in one or both arms, the back, neck, jaw, or stomach. If any of these symptoms last 2 minutes, call 911.  Call your doctor  or nurse as soon as possible if you have any of these symptoms:   Pain in fingers, toes, joints, or testicles   Numbness, tingling, or decreased feeling or weakness in fingers, toes, arms, or legs   Trouble walking or changes in the way you walk   Swelling of your legs, ankles, and/or feet   Headache   Pain in your mouth or throat that makes it hard to eat or drink   Loss of appetite or weight loss   Hearing changes   Depression   Trouble sleeping   Reproduction Concerns   Women may go through signs of menopause (change of life) like vaginal dryness or itching. Vaginal lubricants can be used to lessen vaginal dryness, itching, and pain during sexual relations.   Pregnancy warning: This drug may have harmful effects on the unborn child, so effective methods of birth control should be used during your cancer treatment.   Breast feeding warning: It is not known if this drug passes into breast milk. For this reason, women should talk to their doctor about the risks and benefits of breast feeding during treatment with this drug because this drug may enter the breast milk and badly harm a breast feeding baby.   Cyclophosphamide (Generic Name) Other Names: Cytoxan, Neosar  About This Drug  Cyclophosphamide is a drug used to treat cancer. It is given in the vein (IV) or by mouth. This drug takes 30 minutes to infuse.  Possible Side Effects (More Common)   Nausea and throwing up (vomiting). These symptoms may happen within a few hours after your treatment and may last up to 72 hours. Medicines are available to stop or lessen these side effects.   Bone marrow depression. This is a decrease in the number of white blood cells, red blood cells, and platelets. This may raise your risk of infection, make you tired and weak (fatigue), and raise your risk of bleeding.   Hair loss: You may notice hair getting thin. Some patients lose their hair. Hair loss is often complete scalp hair  loss and can involve loss of eyebrows, eyelashes, and pubic hair. You may notice this a few days or weeks after treatment has started. Most often hair loss is temporary; your hair should grow back when treatment is done.   Decreased appetite (decreased hunger)   Blurred vision  Soreness of the mouth and throat. You may have red areas, white patches, or sores that hurt.   Effects on the bladder. This drug may cause irritation and bleeding in the bladder. You may have blood in your urine. To help stop this, you will get extra fluids to help you pass more urine. You may get a drug called mesna, which helps to decrease irritation and bleeding. You may also get a medicine to help you pass more urine. You may have a catheter (tube) placed in your bladder so that your bladder will be washed with this drug.  Possible Side Effects (Less Common)   Darkening of the skin or nails   Metallic taste in the mouth   Changes in lung tissue may happen with large amounts of this drug. These changes may not last forever, and your lung tissue may go back to normal. Sometimes these changes may not be seen for many years. You may get a cough or have trouble catching your breath.   Allergic Reactions  Serious allergic reactions including anaphylaxis are rare. While you are getting this drug in your vein (IV), tell your nurse right away if you have any of these symptoms of an allergic reaction:   Trouble catching your breath   Feeling like your tongue or throat are swelling   Feeling your heart beat quickly or in a not normal way (palpitations)   Feeling dizzy or lightheaded   Flushing, itching, rash, and/or hives   Treating Side Effects   Drink 6-8 cups of fluids each day unless your doctor has told you to limit your fluid intake due to some other health problem. A cup is 8 ounces of fluid. If you throw up or have loose bowel movements you should drink more fluids so that you do not become dehydrated  (lack water in the body due to losing too much fluid).   Ask your doctor or nurse about medicine that is available to help stop or lessen nausea or throwing up.   Mouth care is very important. Your mouth care should consist of routine, gentle cleaning of your teeth or dentures and rinsing your mouth with a mixture of 1/2 teaspoon of salt in 8 ounces of water or  teaspoon of baking soda in 8 ounces of water. This should be done at least after each meal and at bedtime.   If you have mouth sores, avoid mouthwash that has alcohol. Also avoid alcohol and smoking because they can bother your mouth and throat.   Talk with your nurse about getting a wig before you lose your hair. Also, call the [facility] at 800-ACS-2345 to find out information about the " Look Good.[employer] KitchenMarland KitchenFeel Better" program close to where you live. It is a free program where women undergoing chemotherapy learn about wigs, turbans and scarves as well as makeup techniques and skin and nail care.  Important Information   Whenever you tell a doctor or nurse your health history, always tell them that you have received cyclophosphamide in the past.   If you take this drug by mouth swallow the medicine whole. Do not chew, break or crush it.   You can take the medicine with or without food. If you have nausea, take it with food. Do not take the pills at bedtime.  Food and Drug Interactions  There are no known interactions of cyclophosphamide with food. This drug may interact with other medicines. Tell your doctor and pharmacist about all the medicines and dietary  supplements (vitamins, minerals, herbs and others) that you are taking at this time. The safety and use of dietary supplements and alternative diets are often not known. Using these might affect your cancer or interfere with your treatment. Until more is known, you should not use dietary supplements or alternative diets without your cancer doctor's help.   When to Call  the Doctor  Call your doctor or nurse right away if you have any of these symptoms:   Fever of 100.4 F (38 C) or higher   Chills   Bleeding or bruising that is not normal   Blurred vision or other changes in eyesight   Pain when passing urine; blood in urine   Pain in your lower back or side   Wheezing or trouble breathing   Swelling of legs, ankles, or feet   Feeling dizzy or lightheaded   Feeling confused or agitated   Signs of liver problems: dark urine, pale bowel movements, bad stomach pain, feeling very tired and weak, unusual itching, or yellowing of the eyes or skin   Unusual thirst or passing urine often   Nausea that stops you from eating or drinking   Throwing up/vomiting  Call your doctor or nurse as soon as possible if any of these symptoms happen:   Pain in your mouth or throat that makes it hard to eat or drink   Nausea not relieved by prescribed medicines   Sexual Problems and Reproductive Concerns   Infertility warning: Sexual problems and reproduction concerns may happen. In both men and women, this drug may affect your ability to have children. This cannot be determined before your treatment. Talk with your doctor or nurse if you plan to have children. Ask for information on sperm or egg banking.   In men, this drug may interfere with your ability to make sperm, but it should not change your ability to have sexual relations.   In women, menstrual bleeding may become irregular or stop while you are getting this drug. Do not assume that you cannot become pregnant if you do not have a menstrual period.   Women may go through signs of menopause (change of life) like vaginal dryness or itching. Vaginal lubricants can be used to lessen vaginal dryness, itching, and pain during sexual relations.   Genetic counseling is available for you to talk about the effects of this drug therapy on future pregnancies. Also, a genetic counselor can look at the possible  risk of problems in the unborn baby due to this medicine if an exposure happens during pregnancy.   Pregnancy warning: This drug may have harmful effects on the unborn child, so effective methods of birth control should be used during your cancer treatment.   Breast feeding warning: Women should not breast feed during treatment because this drug could enter the breast milk and badly harm a breast feeding baby.   Rituximab (Generic Name) Other Name: Rituxan  About This Drug  Rituximab is a monoclonal antibody used to treat cancer. This drug is given in the vein (IV).  This first time this is given it will be infused slower to monitor for infusion reactions.    Possible Side Effects (More Common)   Bone marrow depression. This is a decrease in the number of white blood cells, red blood cells, and platelets. This  may raise your risk of infection, make you tired and weak (fatigue), and raise your risk of bleeding.   Rash-skin irritation, redness or itching (dermatitis)  Flu-like symptoms: fever, headache, muscle and joint aches, and fatigue (low energy, feeling weak)   Infusion-related reactions   Hepatitis B - if you have ever had hepatitis B, the virus may come back during treatment with this drug. Your doctor will test to see if you have ever had hepatitis B prior to your treatment.   Changes in your central nervous system can happen. The central nervous system is made up of your brain and spinal cord. You could feel: extreme tiredness, agitation, confusion, or have: hallucinations (see or hear things that are not there), trouble understanding or speaking, loss of control of your bowels or bladder, eyesight changes, numbness or lack of strength to your arms, legs, face, or body, seizures or coma. If you start to have any of these symptoms let your doctor know right away.   Tumor lysis: This drug may act on the cancer cells very quickly. This may affect how your kidneys work. Your  doctor will monitor your kidney function.   Changes in your liver function. Your doctor will check your liver function as needed.   Nausea and throwing up (vomiting): these symptoms may happen within a few hours after your treatment and may last up to 24 hours. Medicines are available to stop or lessen these side effects.   Loose bowel movements (diarrhea) that may last for a few days   Abdominal pain   Infections   Cough, runny nose   Swelling of your legs, ankles and/or feet or hands   High blood pressure. Your doctor will check your blood pressure as needed.   Abnormal heart beat  Possible Side Effects (Less Common)   Shortness of breath   Soreness of the mouth and throat. You may have red areas, white patches, or sores that hurt.  Infusion Reactions  Infusion Reactions are the most common side effect linked to use of this drug and can be quite severe. Medicines will be given before you get the drug to lower the severity of this side effect. The infusion reactions are the worse with the first dose of the drug and become less severe with more doses of the drug. While you are getting this drug in your vein (IV), tell your nurse right away if you have any of these symptoms of an allergic reaction:   Trouble catching your breath   Feeling like your tongue or throat are swelling   Feeling your heart beat quickly or in a not normal way (palpitations)   Feeling dizzy or lightheaded   Flushing, itching, rash, and/or hives   Treating Side Effects   Ask your doctor or nurse about medicine to stop or lessen headache, loose bowel movements (diarrhea), constipation, nausea, throwing up (vomiting), or pain.   If you get a rash do not put anything it unless your doctor or nurse says you may. Keep the area around the rash clean and dry. Ask your doctor for medicine if the rash bothers you.   Drink 6-8 cups of fluids each day unless your doctor has told you to limit your fluid  intake due to some other health problem. A cup is 8 ounces of fluid. If you throw up or have loose bowel movements, you should drink more fluids so that you do not become dehydrated (lack of water in the body from losing too much fluid).   If you are not able to move your bowels, check with your doctor or nurse before you use enemas, laxatives, or suppositories  If you have mouth sores, avoid mouthwash that has alcohol. Also avoid alcohol and smoking because they can bother your mouth and throat.   If you have a nose bleed, sit with your head tipped slightly forward. Apply pressure by lightly pinching the bridge of your nose between your thumb and forefinger. Call your doctor if you feel dizzy or faint or if the bleeding doesn't stop after 10 to 15 minutes   Important Information   After treatment with this drug, vaccination with live viruses should be delayed until the immune system recovers.   Symptoms of abnormal bleeding may be: coughing up blood, throwing up blood (may look like coffee grounds), red or black, tarry bowel movements, blood in urine, abnormally heavy menstrual flow, nosebleeds, or any unusual bleeding.   Symptoms of high blood pressure may be: headache, blurred vision, confusion, chest pain, or a feeling that your heart is beat differently.   Urinary tract infection. Symptoms may include:   Pain or burning when you pass urine   Feeling like you have to pass urine often, but not much comes out when you do.   Tender or heavy feeling in your lower abdomen   Cloudy urine and/or urine that smells bad.   Pain on one side of your back under your ribs. This is where your kidneys are.   Fever, chills, nausea and/or throwing up   Food and Drug Interactions  There are no known interactions of rituximab and any food. This drug may interact with other medicines. Tell your doctor and pharmacist about all the medicines and dietary supplements (vitamins, minerals, herbs and  others) that you are taking at this time. The safety and use of dietary supplements and alternative diets are often not known. Using these might affect your cancer or interfere with your treatment. Until more is known, you should not use dietary supplements or alternative diets without your cancer doctor's help.   When to Call the Doctor  Call your doctor or nurse right away if you have any of these symptoms:   Fever of 100.4 F (38 C) higher   Chills   Trouble breathing   Rash with or without itching   Blistering or peeling of skin   Chest pain or symptoms of a heart attack. Most heart attacks involve pain in the center of the chest that lasts more than a few minutes. The pain may go away and come back or it can be constant. It can feel like pressure, squeezing, fullness, or pain. Sometimes pain is felt in one or both arms, the back, neck, jaw, or stomach. If any of these symptoms last 2 minutes, call 911   Easy bleeding or bruising   Blood in urine or bowel movements   Feeling that your heart is beating in a fast or not normal way (palpitations)   Nausea that stops you from eating or drinking   Throwing up/vomiting   Abdominal pain   Loose bowel movements (diarrhea) 4 times in one day or diarrhea with weakness or lightheadedness   No bowel movement in 3 days or if you feel uncomfortable   Feeling dizzy or lightheaded   Changes in your speech or vision   Feeling confused   Weakness of your arms and legs or poor coordination (feeling clumsy)   Signs of liver problems: dark urine, pale bowel movements, bad stomach pain, feeling very tired and weak, unusual itching, or yellowing of skin or eyes   Symptoms of a urinary tract infection (  see important information)   Call your doctor or nurse as soon as possible if you have any of these symptoms:   Swelling of your legs, ankles and/or feet   Fatigue and /or weakness that interferes with your daily activities   Joint and  muscle pain or muscle spasms that are not relieved by prescribed medicines   Cough that lasts longer than normal   Reproduction Concerns   Pregnancy warning: This drug is known to cross the placenta. This drug may have harmful effects on an unborn baby. Effective methods of birth control should be used during treatment with this drug and for 12 months after the last treatment. If exposure occurs to an unborn baby, the baby's immune system may be affected, which could last for months after birth. Until the immune system recovers, live vaccines should not be administered to the baby. Be sure to talk with your doctor if you are pregnant or planning to become pregnant while getting this drug.   Breast feeding warning: It is not known if rituximab is passed into human breast milk. In animal studies, this drug was detected in in breast milk. For this reason, women should talk to their doctor about the risks and benefits of breast feeding during treatment with this drug because this drug may enter the breast milk and badly harm a breast feeding baby.   Prednisone  About This Drug  Prednisone is a steroid that may be used to treat cancer. It is given orally (by mouth).  You will take this medication (100 mg daily with food) on Days 1-5.  Day 1 is each day you come to the clinic to get chemotherapy treatment.  You will start the prednisone on that day, and continue to take daily for the four days after, to total 5 days.  You will do this with each cycle of chemotherapy.  You do not take this medication on the weeks you are not getting treatment.   Possible Side Effects   Abnormal heart beat   Headache   High blood pressure   Increased sweating   Blurred vision or other changes in eyesight   Tiredness and weakness   Changes in mood, which may include depression or a feeling of extreme well-being   Trouble sleeping   Feeling restless (unable to relax)   Skin changes such as rash, dryness,  or redness   Increased appetite (increased hunger)   Weight gain   Electrolyte changes   Increased risk of infections   Aggravation of stomach ulcers   Pain in your abdomen   Nausea   Blood sugar levels may change   Swelling of your legs, ankles and/or feet   Muscle loss and/or weakness (lack of muscle strength)   Increased risk of developing osteoporosis- your bones may become weak and brittle   Increased risk for cataracts, glaucoma or infections of the eye   Changes in your liver function  Note: Not all possible side effects are included above.  Warnings and Precautions   High blood pressure and changes in electrolytes, which can cause fluid build-up around your heart, lungs or elsewhere.   Severe infections, which can be life-threatening.   Allergic reactions, including anaphylaxis are rare but may happen in some patients. Signs of allergic reaction to this drug may be swelling of the face, feeling like your tongue or throat are swelling, trouble breathing, rash, itching, fever, chills, feeling dizzy, and/or feeling that your heart is beating in a fast or not  normal way. If this happens, do not take another dose of this drug. You should get urgent medical treatment.   Increased risk of developing a hole in your stomach, small, and/or large intestine if you have ulcers in the lining of your stomach and/or intestine, or have diverticulitis, ulcerative colitis and/or other diseases that affect the gastrointestinal tract.   Blurred vision or other changes in eyesight.   Severe depression and other psychiatric disorders such as mood changes.   Effects on the endocrine glands including pituitary, adrenals and thyroid during or after use of this medication.  Note: Some of the side effects above are very rare. If you have concerns and/or questions, please discuss them with your medical team.  Important Information   Talk to your doctor or your nurse before stopping this  medication, it should be stopped gradually. Depending on the dose and length of treatment, you could experience serious side effects if stopped abruptly (suddenly).   Talk to your doctor before receiving any vaccinations during your treatment. Some vaccinations are not recommended while receiving prednisone.  How to Take Your Medication   For Oral (by mouth): You can take the medicine with or without food, but it is preferable to be taken with food, especially If you have nausea or upset stomach.   Missed dose: If you miss or vomit a dose, contact your physician. Do not take 2 doses at the same time and do not double up on the next dose.   Handling: Wash your hands after handling your medicine, your caretakers should not handle your medicine with bare hands and should wear latex gloves.   Storage: Store this medicine in the original container at room temperature, protect from moisture. Discuss with your nurse or your doctor how to dispose of unused medicine.   Treating Side Effects   Drink plenty of fluids (a minimum of eight glasses per day is recommended).   To help with nausea and vomiting, eat small, frequent meals instead of three large meals a day. Choose foods and drinks that are at room temperature. Ask your nurse or doctor about other helpful tips and medicine that is available to help stop or lessen these symptoms.   If you throw up, you should drink more fluids so that you do not become dehydrated (lack of water in the body from losing too much fluid).   Manage tiredness by pacing your activities for the day. Be sure to include periods of rest between energy-draining activities.   To help with muscle weakness, get regular exercise. If you feel too tired to exercise vigorously, try taking a short walk.   If you are having trouble sleeping, talk to your nurse or doctor on tips to help you sleep better.   If you are feeling depressed, talk to your nurse or doctor about it.    Keeping your pain under control is important to your well-being. Please tell your doctor or nurse if you are experiencing pain.   If you have diabetes, keep good control of your blood sugar level. Tell your nurse or your doctor if your glucose levels are higher or lower than normal.   To decrease the risk of infection, wash your hands regularly.   Avoid close contact with people who have a cold, the flu, or other infections.   Take your temperature as your doctor or nurse tells you, and whenever you feel like you may have a fever.   If you get a rash do not put  anything on it unless your doctor or nurse says you may. Keep the area around the rash clean and dry. Ask your doctor for medicine if your rash bothers you.   Moisturize your skin several times a day   Avoid sun exposure and apply sunscreen routinely when outdoors.   Food and Drug Interactions   This drug may interact with grapefruit and grapefruit juice. Talk to your doctor as this could make side effects worse.   Check with your doctor or pharmacist about all other prescription medicines and over-the-counter medicines and dietary supplements (vitamins, minerals, herbs and others) you are taking before starting this medicine as there are known drug interactions with prednisone. Also, check with your doctor or pharmacist before starting any new prescription or over-the-counter medicines, or dietary supplements to make sure that there are no interactions.   There are known interactions of prednisone with other medicines and products like aspirin, and other nonsteroidal anti-inflammatory agents. Ask your doctor what over-the-counter (OTC) are safe to take while taking this medication.    There are known interactions of prednisone with blood thinning medicine such as warfarin. Ask your doctor what precautions you should take.   Avoid the use of St. John's Wort while taking prednisone as this may lower the levels of the drug in your  body, which can make it less effective.   When to Call the Doctor  Call your doctor or nurse if you have any of these symptoms and/or any new or unusual symptoms:   Fever of 100.4 F (38 C) or higher   Chills   A headache that does not go away   Blurry vision or other changes in eyesight   Feel irritable, nervous or restless   Trouble sleeping   Tiredness or weakness that interferes with your daily activities   Trouble breathing   Feeling that your heart is beating in a fast or not normal way (palpitations)   Chest pain or symptoms of a heart attack. Most heart attacks involve pain in the center of the chest that lasts more than a few minutes. The pain may go away and come back or it can be constant. It can feel like pressure, squeezing, fullness, or pain. Sometimes pain is felt in one or both arms, the back, neck, jaw, or stomach. If any of these symptoms last 2 minutes, call 911.   Nausea that stops you from eating or drinking and/or is not relieved by prescribed medicines   Throwing up   Severe abdominal pain that does not go away   Heartburn or indigestion   Abnormal blood sugar   Severe mood changes such as depression or unusual thoughts and/or behaviors   Thoughts of hurting yourself or others, and suicide   Feeling hopeless on most days   Unusual thirst, passing urine often, headache, sweating, shakiness, irritability   Swelling of legs, ankles, or feet   Weight gain of 5 pounds in one week (fluid retention)   A new rash or a rash that is not relieved by prescribed medicines   Signs of possible liver problems: dark urine, pale bowel movements, bad stomach pain, feeling very tired and weak, unusual itching, or yellowing of the eyes or skin   Signs of allergic reaction: swelling of the face, feeling like your tongue or throat are swelling, trouble breathing, rash, itching, fever, chills, feeling dizzy, and/or feeling that your heart is beating in a fast or  not normal way. If this happens, call 911 for emergency  care.   If you think you may be pregnant  Reproduction Warnings   Pregnancy warning: It is not known if this drug may harm an unborn child. For this reason, be sure to talk with your doctor if you are pregnant or planning to become pregnant while receiving this drug. Let your doctor know right away if you think you may be pregnant.   Breastfeeding warning: It is not known if this drug passes into breast milk. For this reason, women should talk to their doctor about the risks and benefits of breastfeeding during treatment with this drug because this drug may enter the breast milk and cause harm to a breastfeeding baby.   Fertility warning: Human fertility studies have not been done with this drug. Talk with your doctor or nurse if you plan to have children. Ask for information on sperm or egg banking.  You will get Neulasta (or similar) after every treatment of chemo:  Neulasta (or similar) - this medication is not chemotherapy but is being given because you have had chemo. It is usually given 24-48 hours after the completion of chemotherapy. This medication works by boosting your bone marrow's supply of white blood cells. White blood cells are what protect our bodies against infection. The medication is given in the form of a subcutaneous injection. It is given in the fatty tissue of your abdomen, or in the skin on the back of your arm . It is a short needle. The major side effect of this medication is bone or muscle pain. The drug of choice to relieve or lessen the pain is Aleve or Ibuprofen. If a physician has ever told you not to take Aleve or Ibuprofen - then don't take it. You should then take Tylenol/acetaminophen. Take either medication as the bottle directs you to.  The level of pain you experience as a result of this injection can range from none, to mild or moderate, or severe. Please let us know if you develop moderate or severe bone  pain.   You can take Claritin 10 mg over the counter for a few days after receiving Neulasta to help with the bone aches and pains.      SELF CARE ACTIVITIES WHILE RECEIVING CHEMOTHERAPY:  Hydration Increase your fluid intake 48 hours prior to treatment and drink at least 8 to 12 cups (64 ounces) of water/decaffeinated beverages per day after treatment. You can still have your cup of coffee or soda but these beverages do not count as part of your 8 to 12 cups that you need to drink daily. No alcohol intake.  Medications Continue taking your normal prescription medication as prescribed.  If you start any new herbal or new supplements please let us know first to make sure it is safe.  Mouth Care Have teeth cleaned professionally before starting treatment. Keep dentures and partial plates clean. Use soft toothbrush and do not use mouthwashes that contain alcohol. Biotene is a good mouthwash that is available at most pharmacies or may be ordered by calling [phone]. Use warm salt water gargles (1 teaspoon salt per 1 quart warm water) before and after meals and at bedtime. If you need dental work, please let the doctor know before you go for your appointment so that we can coordinate the best possible time for you in regards to your chemo regimen. You need to also let your dentist know that you are actively taking chemo. We may need to do labs prior to your dental appointment.  Skin Care Always use sunscreen that has not expired and with SPF (Sun Protection Factor) of 50 or higher. Wear hats to protect your head from the sun. Remember to use sunscreen on your hands, ears, face, & feet.  Use good moisturizing lotions such as udder cream, eucerin, or even Vaseline. Some chemotherapies can cause dry skin, color changes in your skin and nails.    Avoid long, hot showers or baths. Use gentle, fragrance-free soaps and laundry detergent. Use moisturizers, preferably creams or ointments rather than  lotions because the thicker consistency is better at preventing skin dehydration. Apply the cream or ointment within 15 minutes of showering. Reapply moisturizer at night, and moisturize your hands every time after you wash them.  Hair Loss (if your doctor says your hair will fall out)  If your doctor says that your hair is likely to fall out, decide before you begin chemo whether you want to wear a wig. You may want to shop before treatment to match your hair color. Hats, turbans, and scarves can also camouflage hair loss, although some people prefer to leave their heads uncovered. If you go bare-headed outdoors, be sure to use sunscreen on your scalp. Cut your hair short. It eases the inconvenience of shedding lots of hair, but it also can reduce the emotional impact of watching your hair fall out. Don't perm or color your hair during chemotherapy. Those chemical treatments are already damaging to hair and can enhance hair loss. Once your chemo treatments are done and your hair has grown back, it's OK to resume dyeing or perming hair.  With chemotherapy, hair loss is almost always temporary. But when it grows back, it may be a different color or texture. In older adults who still had hair color before chemotherapy, the new growth may be completely gray.  Often, new hair is very fine and soft.  Infection Prevention Please wash your hands for at least 30 seconds using warm soapy water. Handwashing is the #1 way to prevent the spread of germs. Stay away from sick people or people who are getting over a cold. If you develop respiratory systems such as green/yellow mucus production or productive cough or persistent cough let us know and we will see if you need an antibiotic. It is a good idea to keep a pair of gloves on when going into grocery stores/[employer] to decrease your risk of coming into contact with germs on the carts, etc. Carry alcohol hand gel with you at all times and use it frequently if out in  public. If your temperature reaches 100.4 or higher please call the clinic and let us know.  If it is after hours or on the weekend please go to the ER if your temperature is over 100.4.  Please have your own personal thermometer at home to use.    Sex and bodily fluids If you are going to have sex, a condom must be used to protect the person that isn't taking chemotherapy. Chemo can decrease your libido (sex drive). For a few days after chemotherapy, chemotherapy can be excreted through your bodily fluids.  When using the toilet please close the lid and flush the toilet twice.  Do this for a few day after you have had chemotherapy.   Effects of chemotherapy on your sex life Some changes are simple and won't last long. They won't affect your sex life permanently.  Sometimes you may feel: too tired not strong enough to be very active sick or sore  not in the mood anxious or low  Your anxiety might not seem related to sex. For example, you may be worried about the cancer and how your treatment is going. Or you may be worried about money, or about how you family are coping with your illness. These things can cause stress, which can affect your interest in sex. It's important to talk to your partner about how you feel. Remember - the changes to your sex life don't usually last long. There's usually no medical reason to stop having sex during chemo. The drugs won't have any long term physical effects on your performance or enjoyment of sex. Cancer can't be passed on to your partner during sex  Contraception It's important to use reliable contraception during treatment. Avoid getting pregnant while you or your partner are having chemotherapy. This is because the drugs may harm the baby. Sometimes chemotherapy drugs can leave a man or woman infertile.  This means you would not be able to have children in the future. You might want to talk to someone about permanent infertility. It can be very difficult to  learn that you may no longer be able to have children. Some people find counselling helpful. There might be ways to preserve your fertility, although this is easier for men than for women. You may want to speak to a fertility expert. You can talk about sperm banking or harvesting your eggs. You can also ask about other fertility options, such as donor eggs. If you have or have had breast cancer, your doctor might advise you not to take the contraceptive pill. This is because the hormones in it might affect the cancer.  It is not known for sure whether or not chemotherapy drugs can be passed on through semen or secretions from the vagina. Because of this some doctors advise people to use a barrier method if you have sex during treatment. This applies to vaginal, anal or oral sex. Generally, doctors advise a barrier method only for the time you are actually having the treatment and for about a week after your treatment. Advice like this can be worrying, but this does not mean that you have to avoid being intimate with your partner. You can still have close contact with your partner and continue to enjoy sex.  Animals If you have cats or birds we just ask that you not change the litter or change the cage.  Please have someone else do this for you while you are on chemotherapy.   Food Safety During and After Cancer Treatment Food safety is important for people both during and after cancer treatment. Cancer and cancer treatments, such as chemotherapy, radiation therapy, and stem cell/bone marrow transplantation, often weaken the immune system. This makes it harder for your body to protect itself from foodborne illness, also called food poisoning. Foodborne illness is caused by eating food that contains harmful bacteria, parasites, or viruses.  Foods to avoid Some foods have a higher risk of becoming tainted with bacteria. These include: Unwashed fresh fruit and vegetables, especially leafy vegetables that  can hide dirt and other contaminants Raw sprouts, such as alfalfa sprouts Raw or undercooked beef, especially ground beef, or other raw or undercooked meat and poultry Fatty, fried, or spicy foods immediately before or after treatment.  These can sit heavy on your stomach and make you feel nauseous. Raw or undercooked shellfish, such as oysters. Sushi and sashimi, which often contain raw fish.  Unpasteurized beverages, such as unpasteurized fruit juices, raw  milk, raw yogurt, or cider Undercooked eggs, such as soft boiled, over easy, and poached; raw, unpasteurized eggs; or foods made with raw egg, such as homemade raw cookie dough and homemade mayonnaise  Simple steps for food safety  Shop smart. Do not buy food stored or displayed in an unclean area. Do not buy bruised or damaged fruits or vegetables. Do not buy cans that have cracks, dents, or bulges. Pick up foods that can spoil at the end of your shopping trip and store them in a cooler on the way home.  Prepare and clean up foods carefully. Rinse all fresh fruits and vegetables under running water, and dry them with a clean towel or paper towel. Clean the top of cans before opening them. After preparing food, wash your hands for 20 seconds with hot water and soap. Pay special attention to areas between fingers and under nails. Clean your utensils and dishes with hot water and soap. Disinfect your kitchen and cutting boards using 1 teaspoon of liquid, unscented bleach mixed into 1 quart of water.    Dispose of old food. Eat canned and packaged food before its expiration date (the "use by" or "best before" date). Consume refrigerated leftovers within 3 to 4 days. After that time, throw out the food. Even if the food does not smell or look spoiled, it still may be unsafe. Some bacteria, such as Listeria, can grow even on foods stored in the refrigerator if they are kept for too long.  Take precautions when eating out. At restaurants,  avoid buffets and salad bars where food sits out for a long time and comes in contact with many people. Food can become contaminated when someone with a virus, often a norovirus, or another "bug" handles it. Put any leftover food in a "to-go" container yourself, rather than having the server do it. And, refrigerate leftovers as soon as you get home. Choose restaurants that are clean and that are willing to prepare your food as you order it cooked.   AT HOME MEDICATIONS:                                                                                                                                                                Compazine/Prochlorperazine 10mg  tablet. Take 1 tablet every 6 hours as needed for nausea/vomiting. (This can make you sleepy)   EMLA cream. Apply a quarter size amount to port site 1 hour prior to chemo. Do not rub in. Cover with plastic wrap.    Diarrhea Sheet   If you are having loose stools/diarrhea, please purchase Imodium and begin taking as outlined:  At the first sign of poorly formed or loose stools you should begin taking Imodium (loperamide) 2 mg capsules.  Take two tablets (4mg ) followed by  one tablet (2mg ) every 2 hours - DO NOT EXCEED 8 tablets in 24 hours.  If it is bedtime and you are having loose stools, take 2 tablets at bedtime, then 2 tablets every 4 hours until morning.   Always call the [facility] if you are having loose stools/diarrhea that you can't get under control.  Loose stools/diarrhea leads to dehydration (loss of water) in your body.  We have other options of trying to get the loose stools/diarrhea to stop but you must let us know!  Constipation Sheet  Colace - 100 mg capsules - take 2 capsules daily.  If this doesn't help then you can increase to 2 capsules twice daily.  Please call if the above does not work for you. Do not go more than 2 days without a bowel movement.  It is very important that you do not become constipated.  It will  make you feel sick to your stomach (nausea) and can cause abdominal pain and vomiting.  Nausea Sheet   Compazine/Prochlorperazine 10mg  tablet. Take 1 tablet every 6 hours as needed for nausea/vomiting (This can make you drowsy).  If you are having persistent nausea (nausea that does not stop) please call the [facility] and let us know the amount of nausea that you are experiencing.  If you begin to vomit, you need to call the [facility] and if it is the weekend and you have vomited more than one time and can't get it to stop-go to the Emergency Room.  Persistent nausea/vomiting can lead to dehydration (loss of fluid in your body) and will make you feel very weak and unwell. Ice chips, sips of clear liquids, foods that are at room temperature, crackers, and toast tend to be better tolerated.    SYMPTOMS TO REPORT AS SOON AS POSSIBLE AFTER TREATMENT:  FEVER GREATER THAN 100.4 F  CHILLS WITH OR WITHOUT FEVER  NAUSEA AND VOMITING THAT IS NOT CONTROLLED WITH YOUR NAUSEA MEDICATION  UNUSUAL SHORTNESS OF BREATH  UNUSUAL BRUISING OR BLEEDING  TENDERNESS IN MOUTH AND THROAT WITH OR WITHOUT   PRESENCE OF ULCERS  URINARY PROBLEMS  BOWEL PROBLEMS  UNUSUAL RASH    Wear comfortable clothing and clothing appropriate for easy access to any Portacath or PICC line. Let us know if there is anything that we can do to make your therapy better!   What to do if you need assistance after hours or on the weekends: CALL [phone].  HOLD on the line, do not hang up.  You will hear multiple messages but at the end you will be connected with a nurse triage line.  They will contact the doctor if necessary.  Most of the time they will be able to assist you.  Do not call the hospital operator.     I have been informed and understand all of the instructions given to me and have received a copy. I have been instructed to call the clinic [phone] or my family physician as soon as possible for  continued medical care, if indicated. I do not have any more questions at this time but understand that I may call the [facility] or the Patient Navigator at [phone] during office hours should I have questions or need assistance in obtaining follow-up care.

## 2021-03-28 ENCOUNTER — Other Ambulatory Visit (HOSPITAL_COMMUNITY): Payer: Medicare Other

## 2021-03-28 ENCOUNTER — Other Ambulatory Visit: Payer: Self-pay | Admitting: Radiology

## 2021-03-28 ENCOUNTER — Ambulatory Visit (HOSPITAL_COMMUNITY): Payer: Medicare Other | Admitting: Physician Assistant

## 2021-03-28 ENCOUNTER — Ambulatory Visit (HOSPITAL_COMMUNITY)
Admission: RE | Admit: 2021-03-28 | Discharge: 2021-03-28 | Disposition: A | Discharge status: Home / Self Care | Payer: Medicare Other | Source: Ambulatory Visit | Attending: Otolaryngology | Admitting: Otolaryngology

## 2021-03-28 ENCOUNTER — Other Ambulatory Visit: Payer: Self-pay

## 2021-03-28 DIAGNOSIS — K118 Other diseases of salivary glands: Secondary | ICD-10-CM | POA: Insufficient documentation

## 2021-03-28 MED ORDER — IOHEXOL 300 MG/ML  SOLN
75.0000 mL | Freq: Once | INTRAMUSCULAR | Status: AC | PRN
  Administered 2021-03-28: 75 mL via INTRAVENOUS

## 2021-03-28 NOTE — Progress Notes (Signed)
Pharmacist Chemotherapy Monitoring - Initial Assessment    Anticipated start date: 04/12/21   The following has been reviewed per standard work regarding the patient's treatment regimen: The patient's diagnosis, treatment plan and drug doses, and organ/hematologic function Lab orders and baseline tests specific to treatment regimen  The treatment plan start date, drug sequencing, and pre-medications Prior authorization status  Patient's documented medication list, including drug-drug interaction screen and prescriptions for anti-emetics and supportive care specific to the treatment regimen The drug concentrations, fluid compatibility, administration routes, and timing of the medications to be used The patient's access for treatment and lifetime cumulative dose history, if applicable  The patient's medication allergies and previous infusion related reactions, if applicable   Changes made to treatment plan:  treatment plan date and drug offset times  Follow up needed:  N/A   Roy [name], [facility], 03/28/2021  10:46 AM

## 2021-03-29 ENCOUNTER — Other Ambulatory Visit: Payer: Self-pay

## 2021-03-29 DIAGNOSIS — E2740 Unspecified adrenocortical insufficiency: Secondary | ICD-10-CM

## 2021-03-29 MED ORDER — MIDODRINE HCL 5 MG PO TABS: 5.0000 mg | ORAL_TABLET | Freq: Three times a day (TID) | ORAL | 2 refills | Status: DC

## 2021-03-29 NOTE — Telephone Encounter (Signed)
Left a message requesting a return call to the office. 

## 2021-03-29 NOTE — Telephone Encounter (Signed)
Pt called this morning and states his BP last night was 88/60 and this morning 90/50. States he has a headache and wants to know if he needs to take the medication Dr Dorris Fetch has prescribed him for this. But he does not know the name of it.

## 2021-03-29 NOTE — Telephone Encounter (Signed)
Discussed with pt, understanding voiced. 

## 2021-03-29 NOTE — Telephone Encounter (Signed)
Discussed with pt, he stated he has been taking twice daily but BP continues to be low. States at one time he was taking the midodrine tid and asked if you felt he should return to taking it tid.

## 2021-03-31 ENCOUNTER — Ambulatory Visit (HOSPITAL_COMMUNITY)
Admission: RE | Admit: 2021-03-31 | Discharge: 2021-03-31 | Disposition: A | Discharge status: Home / Self Care | Payer: Medicare Other | Source: Ambulatory Visit | Attending: Hematology | Admitting: Hematology

## 2021-03-31 ENCOUNTER — Encounter (HOSPITAL_COMMUNITY): Payer: Self-pay

## 2021-03-31 ENCOUNTER — Other Ambulatory Visit: Payer: Self-pay

## 2021-03-31 DIAGNOSIS — C8590 Non-Hodgkin lymphoma, unspecified, unspecified site: Secondary | ICD-10-CM | POA: Diagnosis not present

## 2021-03-31 HISTORY — PX: IR IMAGING GUIDED PORT INSERTION: IMG5740

## 2021-03-31 MED ORDER — MIDAZOLAM HCL 2 MG/2ML IJ SOLN
INTRAMUSCULAR | Status: AC
  Filled 2021-03-31: qty 2

## 2021-03-31 MED ORDER — SODIUM CHLORIDE 0.9 % IV SOLN: INTRAVENOUS | Status: DC

## 2021-03-31 MED ORDER — LIDOCAINE-EPINEPHRINE 1 %-1:100000 IJ SOLN
INTRAMUSCULAR | Status: AC | PRN
  Administered 2021-03-31: 10 mL via INTRADERMAL

## 2021-03-31 MED ORDER — FENTANYL CITRATE (PF) 100 MCG/2ML IJ SOLN
INTRAMUSCULAR | Status: AC | PRN
  Administered 2021-03-31: 25 ug via INTRAVENOUS

## 2021-03-31 MED ORDER — HEPARIN SOD (PORK) LOCK FLUSH 100 UNIT/ML IV SOLN
INTRAVENOUS | Status: AC
  Filled 2021-03-31: qty 5

## 2021-03-31 MED ORDER — LIDOCAINE-EPINEPHRINE (PF) 2 %-1:200000 IJ SOLN
INTRAMUSCULAR | Status: AC
  Filled 2021-03-31: qty 20

## 2021-03-31 MED ORDER — FENTANYL CITRATE (PF) 100 MCG/2ML IJ SOLN
INTRAMUSCULAR | Status: AC
  Filled 2021-03-31: qty 2

## 2021-03-31 MED ORDER — MIDAZOLAM HCL 2 MG/2ML IJ SOLN
INTRAMUSCULAR | Status: AC | PRN
  Administered 2021-03-31: 1 mg via INTRAVENOUS

## 2021-03-31 MED ORDER — MIDAZOLAM HCL 2 MG/2ML IJ SOLN
INTRAMUSCULAR | Status: AC | PRN
  Administered 2021-03-31: .5 mg via INTRAVENOUS

## 2021-03-31 MED ORDER — LIP MEDEX EX OINT
TOPICAL_OINTMENT | CUTANEOUS | Status: AC
  Filled 2021-03-31: qty 7

## 2021-03-31 MED ORDER — HEPARIN SOD (PORK) LOCK FLUSH 100 UNIT/ML IV SOLN
INTRAVENOUS | Status: AC | PRN
  Administered 2021-03-31: 500 [IU] via INTRAVENOUS

## 2021-03-31 MED ORDER — FENTANYL CITRATE (PF) 100 MCG/2ML IJ SOLN
INTRAMUSCULAR | Status: AC | PRN
  Administered 2021-03-31: 50 ug via INTRAVENOUS

## 2021-03-31 NOTE — Procedures (Signed)
Vascular and Interventional Radiology Procedure Note  Patient: Roy [name] DOB: Aug 07, 1950 Medical Record Number: [id number] Note Date/Time: 03/31/21 12:32 PM   Performing Physician: Michaelle Birks, MD Assistant(s): None  Diagnosis: Lymphoma  Procedure: PORT PLACEMENT  Anesthesia: Conscious Sedation Complications: None Estimated Blood Loss: Minimal  Findings:  Successful right-sided SL port placement, with the tip of the catheter in the proximal right atrium.  Plan: Catheter ready for use.  See detailed procedure note with images in PACS. The patient tolerated the procedure well without incident or complication and was returned to Recovery in stable condition.    Michaelle Birks, MD [facility]   Pager. [place]

## 2021-03-31 NOTE — H&P (Signed)
Referring Physician(s): Derek Jack  Supervising Physician: Michaelle Birks  Patient Status:  Roy [name] OP  Chief Complaint:  "I'm here for a port a cath"  Subjective: Patient familiar to IR service from bone marrow biopsy on 03/08/2021.  He has a history of stage IVb diffuse large B-cell lymphoma.  Recent PET scan has revealed 1. Enlarged hypermetabolic spleen consistent with lymphoma involvement. 2. No hypermetabolic lymph nodes in the chest abdomen pelvis. 3. Diffuse hypermetabolic activity within the marrow space of the spine is concerning for lymphoma involvement the vertebral bodies. 4. Diffuse ground-glass density within lungs with moderate metabolic activity. In the absence of drug treatment, findings concern for pulmonary infection versus lymphoma involvement. 1. Hypermetabolic nodule in the RIGHT parotid gland is consistent primary parotid neoplasm.   2. Hypermetabolic nodule along the deep margin of the LEFT parotid gland with adjacent nodule/node in the parapharyngeal fat. Indeterminate lesion with differential including atypical primary parotid neoplasm versus hypermetabolic lymph nodes. Consider contrast CT of the neck for further characterization  He presents today for Port-A-Cath placement for chemotherapy.  He currently denies fever, headache, chest pain,  cough, abdominal/back pain, nausea, vomiting or bleeding.  He does have some fatigue, weight loss, dyspnea with exertion, occasional constipation and lower extremity paresthesias.  Past Medical History:  Diagnosis Date   Atrial fibrillation (HCC)    GERD (gastroesophageal reflux disease)    High cholesterol    Hypertension    Obesity    Past Surgical History:  Procedure Laterality Date   APPENDECTOMY     CATARACT EXTRACTION Bilateral 2022   CHOLECYSTECTOMY     ESOPHAGEAL DILATION     multiple times   JOINT REPLACEMENT Left    hip       Allergies: Adhesive [tape] and Sulfa  antibiotics  Medications: Prior to Admission medications   Medication Sig Start Date End Date Taking? Authorizing Provider  acetaminophen (TYLENOL) 325 MG tablet Take 2 tablets (650 mg total) by mouth every 6 (six) hours as needed for headache. 01/13/21  Yes Emokpae, Courage, MD  allopurinol (ZYLOPRIM) 300 MG tablet Take 1 tablet (300 mg total) by mouth daily. 03/22/21  Yes Derek Jack, MD  Calcium Carbonate-Vitamin D (CALCIUM-VITAMIN D) 600-125 MG-UNIT TABS Take by mouth.   Yes [provider]  Cholecalciferol (VITAMIN D3) 25 MCG (1000 UT) CAPS Take by mouth daily.   Yes [provider]  ferrous sulfate 325 (65 FE) MG EC tablet Take 1 tablet (325 mg total) by mouth daily with breakfast. 02/28/21  Yes Pennington, Rebekah M, PA-C  gabapentin (NEURONTIN) 100 MG capsule Take 1 capsule (100 mg total) by mouth 2 (two) times daily. 03/22/21  Yes Derek Jack, MD  Lactulose 20 GM/30ML SOLN Take 30 ml by mouth every 3 hours until a bowel movement occurs.  Then take 30 ml by mouth at bedtime. 03/22/21  Yes Derek Jack, MD  levothyroxine (SYNTHROID) 50 MCG tablet Take 1 tablet (50 mcg total) by mouth daily before breakfast. 03/02/21  Yes Nida, Marella Chimes, MD  lidocaine (XYLOCAINE) 2 % jelly Apply to affected area daily prn 01/16/21 01/16/22 Yes [provider]  magnesium oxide (MAG-OX) 400 (240 Mg) MG tablet Take 1 tablet (400 mg total) by mouth 2 (two) times daily. 10/24/20  Yes Tat, Shanon Brow, MD  midodrine (PROAMATINE) 5 MG tablet Take 1 tablet (5 mg total) by mouth 3 (three) times daily with meals. 03/29/21  Yes Nida, Marella Chimes, MD  omeprazole (PRILOSEC) 10 MG capsule Take 1 capsule by  mouth daily. 11/15/20  Yes [provider]  potassium chloride (KLOR-CON) 10 MEQ tablet Take 10 mEq by mouth 2 (two) times daily. 11/04/20  Yes [provider]  predniSONE (DELTASONE) 10 MG tablet Take 1 tablet (10 mg total) by mouth daily with  breakfast. 03/02/21  Yes Nida, Marella Chimes, MD  psyllium (REGULOID) 0.52 g capsule Take by mouth. 01/16/21 01/16/22 Yes [provider]  tamsulosin (FLOMAX) 0.4 MG CAPS capsule Take 1 capsule by mouth daily. 02/28/21  Yes [provider]  warfarin (COUMADIN) 5 MG tablet Take 1 tablet (5 mg total) by mouth every evening. 01/15/21  Yes Emokpae, Courage, MD  CYCLOPHOSPHAMIDE IV Inject into the vein every 21 ( twenty-one) days. 04/12/21   [provider]  DOXORUBICIN HCL IV Inject into the vein every 21 ( twenty-one) days. 04/12/21   [provider]  riTUXimab in sodium chloride 0.9 % 250 mL Inject into the vein every 21 ( twenty-one) days. 04/12/21   [provider]  rosuvastatin (CRESTOR) 5 MG tablet Take 1 tablet by mouth at bedtime. 08/23/20   [provider]  VINCRISTINE SULFATE IV Inject into the vein every 21 ( twenty-one) days. 04/12/21   [provider]  vitamin B-12 (CYANOCOBALAMIN) 1000 MCG tablet Take 1 tablet (1,000 mcg total) by mouth daily. 02/28/21   Harriett Rush, PA-C     Vital Signs: BP (!) 116/93   Pulse 85   Temp 97.9 F (36.6 C) (Oral)   Resp 20   SpO2 96%   Physical Exam awake, alert.  Chest clear to auscultation bilaterally.  Heart with regular rate and rhythm.  Abdomen soft, splenomegaly noted.  Positive bowel sounds, nontender.  No lower extremity edema.  Imaging: CT SOFT TISSUE NECK W CONTRAST  Result Date: 03/29/2021 CLINICAL DATA:  Parotid mass. EXAM: CT NECK WITH CONTRAST TECHNIQUE: Multidetector CT imaging of the neck was performed using the standard protocol following the bolus administration of intravenous contrast. CONTRAST:  8m OMNIPAQUE IOHEXOL 300 MG/ML  SOLN COMPARISON:  PET scan 03/16/2021 FINDINGS: Pharynx and larynx: Focal mucosal or submucosal lesions are present. The nasopharynx clear. Soft palate and tongue base are within normal limits. Palatine tonsils are within normal limits.  Vallecula and epiglottis are within normal limits. Aryepiglottic folds and piriform sinuses are clear. Vocal cords are midline and symmetric. Trachea is clear. Salivary glands: 2 distinct lesions are present in the posterior right parotid gland. More superior lesion measures 10 x 10 x 11 mm. More inferior and medial the lesion measures 14 x 10 by 17 mm. In the deep lobe of the left parotid gland is a similar homogeneously enhancing lesion measuring 2.2 x 1.1 x 1.7 cm. Medial and superior is a more peripherally enhancing and slightly less distinct lesion measuring 12 x 8 x 7 mm. Submandibular glands and ducts are within normal limits. Thyroid: Negative. Lymph nodes: No significant cervical adenopathy is present. Vascular: Atherosclerotic calcifications are present at the carotid bifurcations bilaterally without significant stenosis. Limited intracranial: Normal limits. Visualized orbits: Bilateral lens replacements are noted. A remote inferior orbital blowout fracture is present left. Globes and orbits are otherwise unremarkable. Mastoids and visualized paranasal sinuses: Left maxillary sinuses chronically shrunken. No active disease is present. The paranasal sinuses and mastoid air cells are otherwise clear. Skeleton: Neck listhesis of the cervical and thoracic spine noted. No focal osseous lesions are present. Extensive ossification of posterior longitudinal ligament results in central canal stenosis is greatest at C4-5 and C5-6. The patient  is edentulous. Upper chest: The lung apices are clear. Thoracic inlet is within normal limits. Atherosclerotic calcifications are noted at the aortic arch. IMPRESSION: 1. Three homogeneously enhancing lesions within the posterior right parotid gland and deep lobe of the left parotid gland are most concerning for Warthin's tumors. Lesions appear benign. 2. More aggressive lesion in the left parotid gland measures 12 mm maximally. This may represent additional Warthin's tumor.  Parotid malignancy metastatic disease not excluded. 3. No significant cervical adenopathy. 4. 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 5. Remote inferior left orbital blowout fracture. 6. Aortic Atherosclerosis (ICD10-I70.0). Electronically Signed   By: San Morelle M.D.   On: 03/29/2021 11:23    Labs:  CBC: Recent Labs    02/21/21 1059 02/27/21 1014 03/08/21 0811 03/13/21 1436  WBC 2.1* 2.3* 3.2* 2.9*  HGB 15.6 15.2 15.5 14.4  HCT 48.5 47.0 45.9 43.1  PLT 51* 64* 90* 73*    COAGS: Recent Labs    01/10/21 0600 01/11/21 0534 01/12/21 0531 01/13/21 0814  INR 2.6* 2.1* 2.4* 3.1*    BMP: Recent Labs    01/13/21 0814 02/21/21 1059 02/23/21 0834 03/13/21 1436  NA 136 133* 137 134*  K 3.7 4.1 4.3 4.3  CL 99 94* 97* 96*  CO2 28 31 32 28  GLUCOSE 79 84 76 107*  BUN 17 25* 19 20  CALCIUM 9.3 9.1 9.3 9.4  CREATININE 0.69 0.83 0.66 0.95  GFRNONAA >60 >60 >60 >60    LIVER FUNCTION TESTS: Recent Labs    01/13/21 0814 02/21/21 1059 02/23/21 0834 03/13/21 1436  BILITOT 2.0* 1.9* 1.4* 1.6*  AST 85* 85* 77* 76*  ALT 19 47* 43 59*  ALKPHOS 50 69 70 90  PROT 6.5 6.9 6.4* 6.5  ALBUMIN 3.9 4.0 3.7 3.6    Assessment and Plan: Patient familiar to IR service from bone marrow biopsy on 03/08/2021.  He has a history of stage IVb diffuse large B-cell lymphoma.  Recent PET scan has revealed 1. Enlarged hypermetabolic spleen consistent with lymphoma involvement. 2. No hypermetabolic lymph nodes in the chest abdomen pelvis. 3. Diffuse hypermetabolic activity within the marrow space of the spine is concerning for lymphoma involvement the vertebral bodies. 4. Diffuse ground-glass density within lungs with moderate metabolic activity. In the absence of drug treatment, findings concern for pulmonary infection versus lymphoma involvement. 1. Hypermetabolic nodule in the RIGHT parotid  gland is consistent primary parotid neoplasm.   2. Hypermetabolic nodule along the deep margin of the LEFT parotid gland with adjacent nodule/node in the parapharyngeal fat. Indeterminate lesion with differential including atypical primary parotid neoplasm versus hypermetabolic lymph nodes. Consider contrast CT of the neck for further characterization  He presents today for Port-A-Cath placement for chemotherapy.Risks and benefits of image guided port-a-catheter placement was discussed with the patient including, but not limited to bleeding, infection, pneumothorax, or fibrin sheath development and need for additional procedures.  All of the patient's questions were answered, patient is agreeable to proceed. Consent signed and in chart.    Electronically Signed: D. Rowe Robert, PA-C 03/31/2021, 10:02 AM   I spent a total of 25 minutes at the the patient's bedside AND on the patient's hospital floor or unit, greater than 50% of which was counseling/coordinating care for Port-A-Cath placement

## 2021-04-03 ENCOUNTER — Other Ambulatory Visit (HOSPITAL_COMMUNITY): Payer: Self-pay | Admitting: Otolaryngology

## 2021-04-03 ENCOUNTER — Other Ambulatory Visit (HOSPITAL_COMMUNITY): Payer: Self-pay

## 2021-04-03 DIAGNOSIS — K118 Other diseases of salivary glands: Secondary | ICD-10-CM

## 2021-04-04 ENCOUNTER — Ambulatory Visit: Payer: Medicare Other | Admitting: Gastroenterology

## 2021-04-05 NOTE — Telephone Encounter (Signed)
Discussed with pt, understanding voiced. 

## 2021-04-05 NOTE — Telephone Encounter (Signed)
Pt has concerns with his BP again. Last night 85/55 this morning 81/47. Cb# [phone]

## 2021-04-05 NOTE — Telephone Encounter (Signed)
Called pt, he states he just took his BP while lying down with an eletronic monitor which resulted 132/112, stated his BP when he sat up dropped to 81/52 and when standing was 87/50. Pt states he has been taking the midodrine three times daily. States last night he experienced chills, this morning head congestion, sweating, low grade fever of 99.1, headache, no dizziness.

## 2021-04-06 ENCOUNTER — Encounter: Payer: Self-pay | Admitting: Physician Assistant

## 2021-04-07 ENCOUNTER — Inpatient Hospital Stay (HOSPITAL_COMMUNITY): Payer: Medicare Other

## 2021-04-07 ENCOUNTER — Inpatient Hospital Stay (HOSPITAL_COMMUNITY): Payer: Medicare Other | Attending: Hematology

## 2021-04-07 ENCOUNTER — Other Ambulatory Visit: Payer: Self-pay

## 2021-04-07 ENCOUNTER — Other Ambulatory Visit (HOSPITAL_COMMUNITY): Payer: Self-pay | Admitting: *Deleted

## 2021-04-07 DIAGNOSIS — E790 Hyperuricemia without signs of inflammatory arthritis and tophaceous disease: Secondary | ICD-10-CM | POA: Diagnosis not present

## 2021-04-07 DIAGNOSIS — I10 Essential (primary) hypertension: Secondary | ICD-10-CM | POA: Diagnosis not present

## 2021-04-07 DIAGNOSIS — D701 Agranulocytosis secondary to cancer chemotherapy: Secondary | ICD-10-CM | POA: Diagnosis not present

## 2021-04-07 DIAGNOSIS — T451X5A Adverse effect of antineoplastic and immunosuppressive drugs, initial encounter: Secondary | ICD-10-CM | POA: Diagnosis not present

## 2021-04-07 DIAGNOSIS — Z5111 Encounter for antineoplastic chemotherapy: Secondary | ICD-10-CM | POA: Insufficient documentation

## 2021-04-07 DIAGNOSIS — Z7952 Long term (current) use of systemic steroids: Secondary | ICD-10-CM | POA: Insufficient documentation

## 2021-04-07 DIAGNOSIS — Z79899 Other long term (current) drug therapy: Secondary | ICD-10-CM | POA: Diagnosis not present

## 2021-04-07 DIAGNOSIS — E860 Dehydration: Secondary | ICD-10-CM | POA: Insufficient documentation

## 2021-04-07 DIAGNOSIS — C8338 Diffuse large B-cell lymphoma, lymph nodes of multiple sites: Secondary | ICD-10-CM

## 2021-04-07 DIAGNOSIS — I4891 Unspecified atrial fibrillation: Secondary | ICD-10-CM | POA: Insufficient documentation

## 2021-04-07 DIAGNOSIS — Z5189 Encounter for other specified aftercare: Secondary | ICD-10-CM | POA: Insufficient documentation

## 2021-04-07 DIAGNOSIS — Z5112 Encounter for antineoplastic immunotherapy: Secondary | ICD-10-CM | POA: Insufficient documentation

## 2021-04-07 DIAGNOSIS — K5900 Constipation, unspecified: Secondary | ICD-10-CM | POA: Insufficient documentation

## 2021-04-07 DIAGNOSIS — B0230 Zoster ocular disease, unspecified: Secondary | ICD-10-CM | POA: Diagnosis not present

## 2021-04-07 DIAGNOSIS — G629 Polyneuropathy, unspecified: Secondary | ICD-10-CM | POA: Diagnosis not present

## 2021-04-07 DIAGNOSIS — R634 Abnormal weight loss: Secondary | ICD-10-CM | POA: Diagnosis not present

## 2021-04-07 DIAGNOSIS — C8330 Diffuse large B-cell lymphoma, unspecified site: Secondary | ICD-10-CM | POA: Diagnosis present

## 2021-04-07 DIAGNOSIS — Z7901 Long term (current) use of anticoagulants: Secondary | ICD-10-CM | POA: Insufficient documentation

## 2021-04-07 LAB — COMPREHENSIVE METABOLIC PANEL
ALT: 36 U/L (ref 0–44)
AST: 59 U/L — ABNORMAL HIGH (ref 15–41)
Albumin: 2.9 g/dL — ABNORMAL LOW (ref 3.5–5.0)
Alkaline Phosphatase: 127 U/L — ABNORMAL HIGH (ref 38–126)
Anion gap: 10 (ref 5–15)
BUN: 21 mg/dL (ref 8–23)
CO2: 25 mmol/L (ref 22–32)
Calcium: 8.8 mg/dL — ABNORMAL LOW (ref 8.9–10.3)
Chloride: 93 mmol/L — ABNORMAL LOW (ref 98–111)
Creatinine, Ser: 0.75 mg/dL (ref 0.61–1.24)
GFR, Estimated: >60 mL/min (ref >60)
Glucose, Bld: 126 mg/dL — ABNORMAL HIGH (ref 70–99)
Potassium: 4.5 mmol/L (ref 3.5–5.1)
Sodium: 128 mmol/L — ABNORMAL LOW (ref 135–145)
Total Bilirubin: 4.1 mg/dL — ABNORMAL HIGH (ref 0.3–1.2)
Total Protein: 5.8 g/dL — ABNORMAL LOW (ref 6.5–8.1)

## 2021-04-07 LAB — CBC WITH DIFFERENTIAL/PLATELET
Basophils Absolute: 0 10*3/uL (ref 0.0–0.1)
Basophils Relative: 0 %
Eosinophils Absolute: 0 10*3/uL (ref 0.0–0.5)
Eosinophils Relative: 0 %
HCT: 35.1 % — ABNORMAL LOW (ref 39.0–52.0)
Hemoglobin: 11.7 g/dL — ABNORMAL LOW (ref 13.0–17.0)
Lymphocytes Relative: 37 %
Lymphs Abs: 1.1 10*3/uL (ref 0.7–4.0)
MCH: 32 pg (ref 26.0–34.0)
MCHC: 33.3 g/dL (ref 30.0–36.0)
MCV: 95.9 fL (ref 80.0–100.0)
Metamyelocytes Relative: 1 %
Monocytes Absolute: 0 10*3/uL — ABNORMAL LOW (ref 0.1–1.0)
Monocytes Relative: 1 %
Neutro Abs: 1.8 10*3/uL (ref 1.7–7.7)
Neutrophils Relative %: 61 %
Platelets: 45 10*3/uL — ABNORMAL LOW (ref 150–400)
RBC: 3.66 MIL/uL — ABNORMAL LOW (ref 4.22–5.81)
RDW: 19.6 % — ABNORMAL HIGH (ref 11.5–15.5)
WBC: 2.9 10*3/uL — ABNORMAL LOW (ref 4.0–10.5)
nRBC: 10.9 % — ABNORMAL HIGH (ref 0.0–0.2)

## 2021-04-07 LAB — PHOSPHORUS: Phosphorus: 3.9 mg/dL (ref 2.5–4.6)

## 2021-04-07 LAB — URIC ACID: Uric Acid, Serum: 6.2 mg/dL (ref 3.7–8.6)

## 2021-04-07 LAB — MAGNESIUM: Magnesium: 1.9 mg/dL (ref 1.7–2.4)

## 2021-04-07 LAB — HEPATITIS C ANTIBODY: HCV Ab: NONREACTIVE

## 2021-04-07 LAB — LACTATE DEHYDROGENASE: LDH: 500 U/L — ABNORMAL HIGH (ref 98–192)

## 2021-04-07 MED ORDER — AMOXICILLIN-POT CLAVULANATE 875-125 MG PO TABS: 1.0000 | ORAL_TABLET | Freq: Two times a day (BID) | ORAL | 0 refills | Status: DC

## 2021-04-07 MED ORDER — LIDOCAINE-PRILOCAINE 2.5-2.5 % EX CREA: TOPICAL_CREAM | CUTANEOUS | 3 refills | Status: DC

## 2021-04-07 MED ORDER — PREDNISONE 20 MG PO TABS
ORAL_TABLET | ORAL | 0 refills | Status: DC
Start: 2021-04-07 — End: 2021-09-27

## 2021-04-07 MED ORDER — PROCHLORPERAZINE MALEATE 10 MG PO TABS: 10.0000 mg | ORAL_TABLET | Freq: Four times a day (QID) | ORAL | 6 refills | Status: DC | PRN

## 2021-04-07 NOTE — Progress Notes (Signed)

## 2021-04-10 ENCOUNTER — Telehealth (HOSPITAL_COMMUNITY): Payer: Self-pay | Admitting: Hematology

## 2021-04-11 ENCOUNTER — Inpatient Hospital Stay (HOSPITAL_COMMUNITY): Payer: Medicare Other | Admitting: General Practice

## 2021-04-11 ENCOUNTER — Encounter (HOSPITAL_COMMUNITY): Payer: Self-pay | Admitting: Hematology

## 2021-04-11 DIAGNOSIS — C8338 Diffuse large B-cell lymphoma, lymph nodes of multiple sites: Secondary | ICD-10-CM

## 2021-04-11 NOTE — Progress Notes (Signed)
[place], Roy [name] [id number]   CLINIC:  Medical Oncology/Hematology  PCP:  Bridget Hartshorn, NP [address] [place] [address] [phone]   REASON FOR VISIT:  Follow-up for leukopenia and thrombocytopenia   PRIOR THERAPY: none  NGS Results: not done  CURRENT THERAPY: R-CHOP every 3 weeks  BRIEF ONCOLOGIC HISTORY:  Oncology History  DLBCL (diffuse large B cell lymphoma) ([facility])  03/22/2021 Initial Diagnosis   DLBCL (diffuse large B cell lymphoma) ([facility])   03/22/2021 Cancer Staging   Staging form: Hodgkin and Non-Hodgkin Lymphoma, AJCC 8th Edition - Clinical stage from 03/22/2021: Stage IV (Diffuse large B-cell lymphoma) - Signed by Derek Jack, MD on 03/22/2021    04/12/2021 -  Chemotherapy   Patient is on Treatment Plan : NON-HODGKINS LYMPHOMA R-CHOP q21d       CANCER STAGING:  Cancer Staging  DLBCL (diffuse large B cell lymphoma) ([facility]) Staging form: Hodgkin and Non-Hodgkin Lymphoma, AJCC 8th Edition - Clinical stage from 03/22/2021: Stage IV (Diffuse large B-cell lymphoma) - Signed by Derek Jack, MD on 03/22/2021   INTERVAL HISTORY:  Roy [name], a 70 y.o. male, returns for routine follow-up and consideration for next cycle of chemotherapy. Roy [name] was last seen on 03/22/2021.  Due for cycle #1 of R-CHOP today.   Overall, he tells me he has been feeling pretty well. He reports good energy and appetite, and he denies current bleeding. He reports numbness in his right fingertips and the soles of his feet bilaterally; he denies associated pain. He has started taking gabapentin 200 mg BID. He reports improved constipation this week with stool softener and Lactulose. He reports reduced vision in his left eye following a possible scratch to that eye on Monday.  Overall, he feels ready for next cycle of chemo today.   REVIEW OF SYSTEMS:  Review of Systems  Constitutional:  Negative  for appetite change and fatigue (40%).  HENT:   Negative for nosebleeds.   Eyes:  Positive for eye problems (L eye).  Respiratory:  Negative for hemoptysis.   Gastrointestinal:  Negative for blood in stool and constipation (improved).  Genitourinary:  Negative for hematuria.   Neurological:  Positive for numbness (R fingertips and soles of feet).  Hematological:  Does not bruise/bleed easily.  All other systems reviewed and are negative.  PAST MEDICAL/SURGICAL HISTORY:  Past Medical History:  Diagnosis Date   Atrial fibrillation (HCC)    GERD (gastroesophageal reflux disease)    High cholesterol    Hypertension    Obesity    Past Surgical History:  Procedure Laterality Date   APPENDECTOMY     CATARACT EXTRACTION Bilateral 2022   CHOLECYSTECTOMY     ESOPHAGEAL DILATION     multiple times   IR IMAGING GUIDED PORT INSERTION  03/31/2021   JOINT REPLACEMENT Left    hip    SOCIAL HISTORY:  Social History   Socioeconomic History   Marital status: Widowed    Spouse name: Not on file   Number of children: 3   Years of education: Not on file   Highest education level: Not on file  Occupational History   Occupation: Retired  Tobacco Use   Smoking status: Never   Smokeless tobacco: Never  Vaping Use   Vaping Use: Never used  Substance and Sexual Activity   Alcohol use: No   Drug use: No   Sexual activity: Not Currently  Other Topics Concern  Not on file  Social History Narrative   Not on file   Social Determinants of Health   Financial Resource Strain: Low Risk    Difficulty of Paying Living Expenses: Not hard at all  Food Insecurity: No Food Insecurity   Worried About Charity fundraiser in the Last Year: Never true   Arboriculturist in the Last Year: Never true  Transportation Needs: No Transportation Needs   Lack of Transportation (Medical): No   Lack of Transportation (Non-Medical): No  Physical Activity: Sufficiently Active   Days of Exercise per Week: 5  days   Minutes of Exercise per Session: 30 min  Stress: No Stress Concern Present   Feeling of Stress : Not at all  Social Connections: Moderately Isolated   Frequency of Communication with Friends and Family: More than three times a week   Frequency of Social Gatherings with Friends and Family: Three times a week   Attends Religious Services: 1 to 4 times per year   Active Member of Clubs or Organizations: No   Attends Archivist Meetings: Never   Marital Status: Widowed  Human resources officer Violence: Not At Risk   Fear of Current or Ex-Partner: No   Emotionally Abused: No   Physically Abused: No   Sexually Abused: No    FAMILY HISTORY:  Family History  Problem Relation Age of Onset   Heart failure Father    Heart attack Father        Deceased    Thyroid disease Sister     CURRENT MEDICATIONS:  Current Outpatient Medications  Medication Sig Dispense Refill   allopurinol (ZYLOPRIM) 300 MG tablet Take 1 tablet (300 mg total) by mouth daily. 30 tablet 3   amoxicillin-clavulanate (AUGMENTIN) 875-125 MG tablet Take 1 tablet by mouth 2 (two) times daily. 14 tablet 0   Calcium Carbonate-Vitamin D (CALCIUM-VITAMIN D) 600-125 MG-UNIT TABS Take by mouth.     Cholecalciferol (VITAMIN D3) 25 MCG (1000 UT) CAPS Take by mouth daily.     CYCLOPHOSPHAMIDE IV Inject into the vein every 21 ( twenty-one) days.     DOXORUBICIN HCL IV Inject into the vein every 21 ( twenty-one) days.     ferrous sulfate 325 (65 FE) MG EC tablet Take 1 tablet (325 mg total) by mouth daily with breakfast. 30 tablet 3   gabapentin (NEURONTIN) 100 MG capsule Take 1 capsule (100 mg total) by mouth 2 (two) times daily. 60 capsule 3   Lactulose 20 GM/30ML SOLN Take 30 ml by mouth every 3 hours until a bowel movement occurs.  Then take 30 ml by mouth at bedtime. 450 mL 2   levothyroxine (SYNTHROID) 50 MCG tablet Take 1 tablet (50 mcg total) by mouth daily before breakfast. 90 tablet 1   lidocaine (XYLOCAINE) 2 %  jelly Apply to affected area daily prn     lidocaine-prilocaine (EMLA) cream Apply a small amount to port a cath site and cover with plastic wrap 1 hour prior to chemotherapy appointments 30 g 3   magnesium oxide (MAG-OX) 400 (240 Mg) MG tablet Take 1 tablet (400 mg total) by mouth 2 (two) times daily. 60 tablet 1   midodrine (PROAMATINE) 5 MG tablet Take 1 tablet (5 mg total) by mouth 3 (three) times daily with meals. 90 tablet 2   omeprazole (PRILOSEC) 10 MG capsule Take 1 capsule by mouth daily.     potassium chloride (KLOR-CON) 10 MEQ tablet Take 10 mEq by mouth 2 (two)  times daily.     predniSONE (DELTASONE) 10 MG tablet Take 1 tablet (10 mg total) by mouth daily with breakfast. 95 tablet 1   predniSONE (DELTASONE) 20 MG tablet Take 100 mg (5 tablets) by mouth daily starting on each day of chemotherapy treatment, then 4 days after.  Repeat every 3 weeks. Do not take on the weeks you are not receiving treatment. 150 tablet 0   psyllium (REGULOID) 0.52 g capsule Take by mouth.     riTUXimab in sodium chloride 0.9 % 250 mL Inject into the vein every 21 ( twenty-one) days.     rosuvastatin (CRESTOR) 5 MG tablet Take 1 tablet by mouth at bedtime.     tamsulosin (FLOMAX) 0.4 MG CAPS capsule Take 1 capsule by mouth daily.     VINCRISTINE SULFATE IV Inject into the vein every 21 ( twenty-one) days.     vitamin B-12 (CYANOCOBALAMIN) 1000 MCG tablet Take 1 tablet (1,000 mcg total) by mouth daily. 30 tablet 3   warfarin (COUMADIN) 5 MG tablet Take 1 tablet (5 mg total) by mouth every evening.     acetaminophen (TYLENOL) 325 MG tablet Take 2 tablets (650 mg total) by mouth every 6 (six) hours as needed for headache. (Patient not taking: Reported on 04/12/2021) 30 tablet 1   prochlorperazine (COMPAZINE) 10 MG tablet Take 1 tablet (10 mg total) by mouth every 6 (six) hours as needed (Nausea or vomiting). (Patient not taking: Reported on 04/12/2021) 30 tablet 6   No current facility-administered medications  for this visit.    ALLERGIES:  Allergies  Allergen Reactions   Adhesive [Tape]     Contact dermatitis   Sulfa Antibiotics     UNKNOWN Childhood reaction    PHYSICAL EXAM:  Performance status (ECOG): 0 - Asymptomatic  Vitals:   04/12/21 0753  BP: 124/75  Pulse: 72  Resp: 18  Temp: (!) 96 F (35.6 C)  SpO2: 98%   Wt Readings from Last 3 Encounters:  04/12/21 227 lb 8.2 oz (103.2 kg)  03/22/21 232 lb 4.8 oz (105.4 kg)  03/13/21 233 lb 3.2 oz (105.8 kg)   Physical Exam Vitals reviewed.  Constitutional:      Appearance: Normal appearance.  Eyes:     Comments: L eye: conjuctival erythema  Cardiovascular:     Rate and Rhythm: Normal rate and regular rhythm.     Pulses: Normal pulses.     Heart sounds: Normal heart sounds.  Pulmonary:     Effort: Pulmonary effort is normal.     Breath sounds: Normal breath sounds.  Musculoskeletal:     Right lower leg: No edema.     Left lower leg: No edema.  Neurological:     General: No focal deficit present.     Mental Status: He is alert and oriented to person, place, and time.  Psychiatric:        Mood and Affect: Mood normal.        Behavior: Behavior normal.    LABORATORY DATA:  I have reviewed the labs as listed.  CBC Latest Ref Rng & Units 04/12/2021 04/07/2021 03/13/2021  WBC 4.0 - 10.5 K/uL 5.1 2.9(L) 2.9(L)  Hemoglobin 13.0 - 17.0 g/dL 10.9(L) 11.7(L) 14.4  Hematocrit 39.0 - 52.0 % 34.4(L) 35.1(L) 43.1  Platelets 150 - 400 K/uL 130(L) 45(L) 73(L)   CMP Latest Ref Rng & Units 04/12/2021 04/07/2021 03/13/2021  Glucose 70 - 99 mg/dL 139(H) 126(H) 107(H)  BUN 8 - 23 mg/dL 14 21 20  Creatinine 0.61 - 1.24 mg/dL 0.65 0.75 0.95  Sodium 135 - 145 mmol/L 133(L) 128(L) 134(L)  Potassium 3.5 - 5.1 mmol/L 4.1 4.5 4.3  Chloride 98 - 111 mmol/L 96(L) 93(L) 96(L)  CO2 22 - 32 mmol/L $RemoveB'29 25 28  '[id number]$ Calcium 8.9 - 10.3 mg/dL 8.5(L) 8.8(L) 9.4  Total Protein 6.5 - 8.1 g/dL 6.2(L) 5.8(L) 6.5  Total Bilirubin 0.3 - 1.2 mg/dL 1.1 4.1(H)  1.6(H)  Alkaline Phos 38 - 126 U/L 125 127(H) 90  AST 15 - 41 U/L 39 59(H) 76(H)  ALT 0 - 44 U/L 33 36 59(H)    DIAGNOSTIC IMAGING:  I have independently reviewed the scans and discussed with the patient. CT SOFT TISSUE NECK W CONTRAST  Result Date: 03/29/2021 CLINICAL DATA:  Parotid mass. EXAM: CT NECK WITH CONTRAST TECHNIQUE: Multidetector CT imaging of the neck was performed using the standard protocol following the bolus administration of intravenous contrast. CONTRAST:  54mL OMNIPAQUE IOHEXOL 300 MG/ML  SOLN COMPARISON:  PET scan 03/16/2021 FINDINGS: Pharynx and larynx: Focal mucosal or submucosal lesions are present. The nasopharynx clear. Soft palate and tongue base are within normal limits. Palatine tonsils are within normal limits. Vallecula and epiglottis are within normal limits. Aryepiglottic folds and piriform sinuses are clear. Vocal cords are midline and symmetric. Trachea is clear. Salivary glands: 2 distinct lesions are present in the posterior right parotid gland. More superior lesion measures 10 x 10 x 11 mm. More inferior and medial the lesion measures 14 x 10 by 17 mm. In the deep lobe of the left parotid gland is a similar homogeneously enhancing lesion measuring 2.2 x 1.1 x 1.7 cm. Medial and superior is a more peripherally enhancing and slightly less distinct lesion measuring 12 x 8 x 7 mm. Submandibular glands and ducts are within normal limits. Thyroid: Negative. Lymph nodes: No significant cervical adenopathy is present. Vascular: Atherosclerotic calcifications are present at the carotid bifurcations bilaterally without significant stenosis. Limited intracranial: Normal limits. Visualized orbits: Bilateral lens replacements are noted. A remote inferior orbital blowout fracture is present left. Globes and orbits are otherwise unremarkable. Mastoids and visualized paranasal sinuses: Left maxillary sinuses chronically shrunken. No active disease is present. The paranasal sinuses  and mastoid air cells are otherwise clear. Skeleton: Neck listhesis of the cervical and thoracic spine noted. No focal osseous lesions are present. Extensive ossification of posterior longitudinal ligament results in central canal stenosis is greatest at C4-5 and C5-6. The patient is edentulous. Upper chest: The lung apices are clear. Thoracic inlet is within normal limits. Atherosclerotic calcifications are noted at the aortic arch. IMPRESSION: 1. Three homogeneously enhancing lesions within the posterior right parotid gland and deep lobe of the left parotid gland are most concerning for Warthin's tumors. Lesions appear benign. 2. More aggressive lesion in the left parotid gland measures 12 mm maximally. This may represent additional Warthin's tumor. Parotid malignancy metastatic disease not excluded. 3. No significant cervical adenopathy. 4. 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 5. Remote inferior left orbital blowout fracture. 6. Aortic Atherosclerosis (ICD10-I70.0). Electronically Signed   By: San Morelle M.D.   On: 03/29/2021 11:23   NM PET Image Initial (PI) Skull Base To Thigh  Addendum Date: 03/23/2021   ADDENDUM REPORT: 03/23/2021 14:35 ADDENDUM: Upon further review and discussion with Dr. Velvet Bathe ENT, there are bilateral hypermetaboleohic nodules associated with the parotid glands. High-density ovoid nodule in the tail of the RIGHT parotid gland measures  10 mm (image 36/3) and has hypermetabolic activity SUV max equal 6.9. Lesion in the deep mid LEFT parotid gland has low central attenuation measuring 15 mm on image 24/series 3). This nodule is hypermetabolic with SUV max equal 8.7. Just anteromedial to this nodule there is a second lymph node which appears to be beyond the margin of the parotid gland measuring 7 mm on image 26/3 with equal metabolic activity. Additional impression : 1. Hypermetabolic  nodule in the RIGHT parotid gland is consistent primary parotid neoplasm. 2. Hypermetabolic nodule along the deep margin of the LEFT parotid gland with adjacent nodule/node in the parapharyngeal fat. Indeterminate lesion with differential including atypical primary parotid neoplasm versus hypermetabolic lymph nodes. Consider contrast CT of the neck for further characterization. Findings conveyed toToehs, MD on 03/23/2021  at14:35. Electronically Signed   By: Suzy Bouchard M.D.   On: 03/23/2021 14:35   Result Date: 03/23/2021 CLINICAL DATA:  Initial treatment strategy for lymphoma. Non-Hodgkin's lymphoma. EXAM: NUCLEAR MEDICINE PET SKULL BASE TO THIGH TECHNIQUE: 12.2 mCi F-18 FDG was injected intravenously. Full-ring PET imaging was performed from the skull base to thigh after the radiotracer. CT data was obtained and used for attenuation correction and anatomic localization. Fasting blood glucose: 66 mg/dl COMPARISON:  CT 10/21/2020 FINDINGS: Mediastinal blood pool activity: SUV max 1.6 Liver activity: SUV max NA NECK: No hypermetabolic lymph nodes in the neck. Incidental CT findings: none CHEST: No hypermetabolic mediastinal lymph nodes. There is diffuse ground-glass opacities within the lungs without discrete nodularity. This diffuse ground-glass density has moderate metabolic activity. For example in the RIGHT lower lobe SUV max equal 3.1 (image 96). Incidental CT findings: none ABDOMEN/PELVIS: Spleen is enlarged and hypermetabolic with SUV max equal 9.9. Spleen measures 17.9 cm in craniocaudad dimension. No hypermetabolic abdominopelvic lymph nodes. No hypermetabolic or enlarged inguinal nodes. Incidental CT findings: none SKELETON: There is diffuse activity within the marrow space of the spine. For example SUV max equal 5.2 at the L5 vertebral body level. No focal activity. Incidental CT findings: LEFT hip prosthetic. IMPRESSION: 1. Enlarged hypermetabolic spleen consistent with lymphoma involvement. 2.  No hypermetabolic lymph nodes in the chest abdomen pelvis. 3. Diffuse hypermetabolic activity within the marrow space of the spine is concerning for lymphoma involvement the vertebral bodies. 4. Diffuse ground-glass density within lungs with moderate metabolic activity. In the absence of drug treatment, findings concern for pulmonary infection versus lymphoma involvement. Electronically Signed: By: Suzy Bouchard M.D. On: 03/17/2021 15:14   IR IMAGING GUIDED PORT INSERTION  Result Date: 03/31/2021 INDICATION: Lymphoma. EXAM: IMPLANTED PORT A CATH PLACEMENT WITH ULTRASOUND AND FLUOROSCOPIC GUIDANCE MEDICATIONS: None. ANESTHESIA/SEDATION: Moderate (conscious) sedation was employed during this procedure. A total of Versed 1.5 mg and Fentanyl 75 mcg was administered intravenously. Moderate Sedation Time: 29 minutes. The patient's level of consciousness and vital signs were monitored continuously by radiology nursing throughout the procedure under my direct supervision. FLUOROSCOPY TIME:  0 minutes, 13 seconds (1 mGy) COMPLICATIONS: None immediate. PROCEDURE: The procedure, risks, benefits, and alternatives were explained to the patient. Questions regarding the procedure were encouraged and answered. The patient understands and consents to the procedure. The neck and chest were prepped with chlorhexidine in a sterile fashion, and a sterile drape was applied covering the operative field. Maximum barrier sterile technique with sterile gowns and gloves were used for the procedure. A timeout was performed prior to the initiation of the procedure. Local anesthesia was provided with 1% lidocaine with epinephrine. After creating a small venotomy incision,  a micropuncture kit was utilized to access the internal jugular vein under direct, real-time ultrasound guidance. Ultrasound image documentation was performed. The microwire was kinked to measure appropriate catheter length. A subcutaneous port pocket was then created  along the upper chest wall utilizing a combination of sharp and blunt dissection. The pocket was irrigated with sterile saline. A single lumen ISP power injectable port was chosen for placement. The 8 Fr catheter was tunneled from the port pocket site to the venotomy incision. The port was placed in the pocket. The external catheter was trimmed to appropriate length. At the venotomy, an 8 Fr peel-away sheath was placed over a guidewire under fluoroscopic guidance. The catheter was then placed through the sheath and the sheath was removed. Final catheter positioning was confirmed and documented with a fluoroscopic spot radiograph. The port was accessed with a Huber needle, aspirated and flushed with heparinized saline. The port pocket incision was closed with interrupted 3-0 Vicryl suture then Dermabond was applied, including at the venotomy incision. Dressings were placed. The patient tolerated the procedure well without immediate post procedural complication. IMPRESSION: Successful placement of a right internal jugular approach power injectable Port-A-Cath. The catheter is ready for immediate use. Michaelle Birks, MD [facility] Electronically Signed   By: Michaelle Birks M.D.   On: 03/31/2021 13:32     ASSESSMENT:  1.  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  High-grade lymphoma panel was negative. - Weight loss of 40 pounds in the last 4 months, stable weight over the last 1 month.  No fevers or night sweats.  - Reviewed PET CT scan from 03/16/2021 which showed hypermetabolic enlarged spleen.  There are hypermetabolic neck lymph nodes.  Diffuse hypermetabolic activity within the marrow space of the spine.  Diffuse groundglass density within the lungs with moderate activity.   2.   Social/family history: - He lives at home by himself.  His son is present today with him. - He is independent of ADLs and IADLs.  Son lives within 30 minutes.  He was never smoker. - He has work-related exposure to dyes.  No pesticide exposure. - Brother died of bone cancer.  Another brother also died of bone cancer.   PLAN:  1.  Diffuse large B-cell lymphoma, stage IVb: - Discussed initiation of chemotherapy with R-CHOP. - Discussed side effects in detail. - Reviewed labs today which showed adequate white count.  Platelet count was 130. - He has a left eye erythema which started Monday morning with some decreased vision.  He is seeing ophthalmology on Friday. - He will proceed with cycle 1 today.  He will be evaluated next week in the symptom management clinic. - RTC 3 weeks for follow-up.     2.  Hyperuricemia: - Continue allopurinol 300 mg daily.  Uric acid is 4.5 today.    3.  Peripheral neuropathy: - His feet are numb with tingling in the bottom of the feet and toes.  Right hand fingers also has some tingling. - Continue gabapentin 200 mg twice daily.  4.  High risk drug monitoring: - Echo on 01/09/2021 with EF 55-60%.  5.  Severe constipation: -Continue Colace and lactulose as needed.   Orders placed this encounter:  No orders of the defined types were placed in this encounter.    Derek Jack, MD [facility] [phone]   I, Thana Ates, am acting as a Education administrator for Dr. Derek Jack.  I, Derek Jack MD, have reviewed the above documentation for accuracy and completeness, and I agree with the above.

## 2021-04-11 NOTE — Progress Notes (Signed)
[facility]  Initial Assessment   Roy [name] is a 70 y.o. year old male contacted by phone. Clinical Social Work was referred by medical oncologist for assessment of psychosocial needs.   SDOH (Social Determinants of Health) assessments performed: Yes   Distress Screen completed: No No flowsheet data found.    Family/Social Information:  Housing Arrangement: patient lives with son, "but I'd rather be at home."  Doctors recommended that he not be living alone.   Family members/support persons in your life? Family and son and his wife are very supportive.  Transportation concerns: yes, "they have to get off work to take me", various family members have taken him to appointments.    Employment: Retired. Income source: Conservation officer, historic buildings; used to work in [employer] setting, retired at [employer] concerns: Yes, current concerns Type of concern: Utilities, Film/video editor, and Medical bills - having difficulty with affording basic expenses.  Food access concerns: yes, is not on Food Stamps Religious or spiritual practice: yes, "I go to church when I can."   Medication Concerns: yes, has difficulty affording, costs have increased significantly.  Advised to speak w PCP about medication access.    Services Currently in place:  is applying for Medicaid w help help from son's wife/[place] DSS.  Will refer to [facility] and [facility].    Coping/ Adjustment to diagnosis: Patient understands treatment plan and what happens next? yes, newly diagnosed with non Hodgkins lymphoma after period of feeling unwell/weight loss.  Is fairly fatiguged "everything slows down when you get old."   Concerns about diagnosis and/or treatment:  financial stress, affording medication and basic expenses, "if something is going to happen, it will happen." Patient reported stressors: Finances and Isolation/ feeling alone Hopes and priorities: getting back to his own  home and "where I can do something."  Likes to do outdoors work, is too fatigued at this point.  Likes to work in his shop.   Patient enjoys  "the shape I am in, I dont do much other than light house work."   Current coping skills/ strengths: Supportive family/friends     SUMMARY: Current SDOH Barriers:  Financial constraints related to low income and Social Isolation  Interventions: Discussed common feeling and emotions when being diagnosed with cancer, and the importance of support during treatment Informed patient of the support team roles and support services at [facility] Provided [place] contact information and encouraged patient to call with any questions or concerns Referred patient to [place], [facility].     Follow Up Plan: Patient will contact CSW with any support or resource needs Patient verbalizes understanding of plan: Yes    [place] [facility], [place], [place] Social Worker Phone:  [phone]

## 2021-04-11 NOTE — Telephone Encounter (Signed)
Spoke to pts daughter. Advised her of some of the assistance programs and foundation. Advised that pt would be eligible for the [facility].

## 2021-04-12 ENCOUNTER — Inpatient Hospital Stay (HOSPITAL_COMMUNITY): Payer: Medicare Other

## 2021-04-12 ENCOUNTER — Inpatient Hospital Stay (HOSPITAL_BASED_OUTPATIENT_CLINIC_OR_DEPARTMENT_OTHER): Payer: Medicare Other | Admitting: Hematology

## 2021-04-12 ENCOUNTER — Other Ambulatory Visit: Payer: Self-pay

## 2021-04-12 VITALS — BP 108/72 | HR 61 | Temp 97.9°F | Resp 20

## 2021-04-12 VITALS — BP 124/75 | HR 72 | Temp 96.0°F | Resp 18 | Wt 227.5 lb

## 2021-04-12 DIAGNOSIS — C8338 Diffuse large B-cell lymphoma, lymph nodes of multiple sites: Secondary | ICD-10-CM

## 2021-04-12 DIAGNOSIS — C8590 Non-Hodgkin lymphoma, unspecified, unspecified site: Secondary | ICD-10-CM

## 2021-04-12 DIAGNOSIS — Z5112 Encounter for antineoplastic immunotherapy: Secondary | ICD-10-CM | POA: Diagnosis not present

## 2021-04-12 LAB — CBC WITH DIFFERENTIAL/PLATELET
Abs Immature Granulocytes: 0.07 10*3/uL (ref 0.00–0.07)
Basophils Absolute: 0.1 10*3/uL (ref 0.0–0.1)
Basophils Relative: 1 %
Eosinophils Absolute: 0 10*3/uL (ref 0.0–0.5)
Eosinophils Relative: 0 %
HCT: 34.4 % — ABNORMAL LOW (ref 39.0–52.0)
Hemoglobin: 10.9 g/dL — ABNORMAL LOW (ref 13.0–17.0)
Immature Granulocytes: 1 %
Lymphocytes Relative: 43 %
Lymphs Abs: 2.2 10*3/uL (ref 0.7–4.0)
MCH: 31.5 pg (ref 26.0–34.0)
MCHC: 31.7 g/dL (ref 30.0–36.0)
MCV: 99.4 fL (ref 80.0–100.0)
Monocytes Absolute: 0.2 10*3/uL (ref 0.1–1.0)
Monocytes Relative: 5 %
Neutro Abs: 2.6 10*3/uL (ref 1.7–7.7)
Neutrophils Relative %: 50 %
Platelets: 130 10*3/uL — ABNORMAL LOW (ref 150–400)
RBC: 3.46 MIL/uL — ABNORMAL LOW (ref 4.22–5.81)
RDW: 20.8 % — ABNORMAL HIGH (ref 11.5–15.5)
WBC: 5.1 10*3/uL (ref 4.0–10.5)
nRBC: 3.7 % — ABNORMAL HIGH (ref 0.0–0.2)

## 2021-04-12 LAB — COMPREHENSIVE METABOLIC PANEL
ALT: 33 U/L (ref 0–44)
AST: 39 U/L (ref 15–41)
Albumin: 3 g/dL — ABNORMAL LOW (ref 3.5–5.0)
Alkaline Phosphatase: 125 U/L (ref 38–126)
Anion gap: 8 (ref 5–15)
BUN: 14 mg/dL (ref 8–23)
CO2: 29 mmol/L (ref 22–32)
Calcium: 8.5 mg/dL — ABNORMAL LOW (ref 8.9–10.3)
Chloride: 96 mmol/L — ABNORMAL LOW (ref 98–111)
Creatinine, Ser: 0.65 mg/dL (ref 0.61–1.24)
GFR, Estimated: >60 mL/min (ref >60)
Glucose, Bld: 139 mg/dL — ABNORMAL HIGH (ref 70–99)
Potassium: 4.1 mmol/L (ref 3.5–5.1)
Sodium: 133 mmol/L — ABNORMAL LOW (ref 135–145)
Total Bilirubin: 1.1 mg/dL (ref 0.3–1.2)
Total Protein: 6.2 g/dL — ABNORMAL LOW (ref 6.5–8.1)

## 2021-04-12 LAB — URIC ACID: Uric Acid, Serum: 4.9 mg/dL (ref 3.7–8.6)

## 2021-04-12 LAB — MAGNESIUM: Magnesium: 2 mg/dL (ref 1.7–2.4)

## 2021-04-12 MED ORDER — SODIUM CHLORIDE 0.9 % IV SOLN
375.0000 mg/m2 | Freq: Once | INTRAVENOUS | Status: AC
  Administered 2021-04-12: 900 mg via INTRAVENOUS
  Filled 2021-04-12: qty 40

## 2021-04-12 MED ORDER — FAMOTIDINE IN NACL 20-0.9 MG/50ML-% IV SOLN
20.0000 mg | Freq: Once | INTRAVENOUS | Status: AC
  Administered 2021-04-12: 20 mg via INTRAVENOUS
  Filled 2021-04-12: qty 50

## 2021-04-12 MED ORDER — SODIUM CHLORIDE 0.9 % IV SOLN: Freq: Once | INTRAVENOUS | Status: AC

## 2021-04-12 MED ORDER — SODIUM CHLORIDE 0.9% FLUSH
10.0000 mL | INTRAVENOUS | Status: DC | PRN
  Administered 2021-04-12: 10 mL

## 2021-04-12 MED ORDER — DIPHENHYDRAMINE HCL 25 MG PO CAPS
50.0000 mg | ORAL_CAPSULE | Freq: Once | ORAL | Status: AC
  Administered 2021-04-12: 50 mg via ORAL
  Filled 2021-04-12: qty 2

## 2021-04-12 MED ORDER — SODIUM CHLORIDE 0.9 % IV SOLN
375.0000 mg/m2 | Freq: Once | INTRAVENOUS | Status: DC
  Filled 2021-04-12: qty 90

## 2021-04-12 MED ORDER — ACETAMINOPHEN 325 MG PO TABS
650.0000 mg | ORAL_TABLET | Freq: Once | ORAL | Status: AC
  Administered 2021-04-12: 650 mg via ORAL
  Filled 2021-04-12: qty 2

## 2021-04-12 MED ORDER — HEPARIN SOD (PORK) LOCK FLUSH 100 UNIT/ML IV SOLN
500.0000 [IU] | Freq: Once | INTRAVENOUS | Status: AC | PRN
  Administered 2021-04-12: 500 [IU]

## 2021-04-12 MED ORDER — SODIUM CHLORIDE 0.9 % IV SOLN
10.0000 mg | Freq: Once | INTRAVENOUS | Status: AC
  Administered 2021-04-12: 10 mg via INTRAVENOUS
  Filled 2021-04-12: qty 10

## 2021-04-12 NOTE — Progress Notes (Signed)
Patients port flushed without difficulty.  Good blood return noted with no bruising or swelling noted at site.  Stable during access and blood draw.  Patient to remain accessed for treatment. 

## 2021-04-12 NOTE — Progress Notes (Signed)
Patient has been examined, vital signs and labs have been reviewed by Dr. Katragadda. ANC, Creatinine, LFTs, hemoglobin, and platelets are within treatment parameters per Dr. Katragadda. Patient may proceed with treatment per M.D.   

## 2021-04-12 NOTE — Patient Instructions (Signed)
[place]  Discharge Instructions: Thank you for choosing [facility] to provide your oncology and hematology care.  If you have a lab appointment with the [facility], please come in thru the Main Entrance and check in at the main information desk.  Wear comfortable clothing and clothing appropriate for easy access to any Portacath or PICC line.   We strive to give you quality time with your provider. You may need to reschedule your appointment if you arrive late (15 or more minutes).  Arriving late affects you and other patients whose appointments are after yours.  Also, if you miss three or more appointments without notifying the office, you may be dismissed from the clinic at the provider's discretion.      For prescription refill requests, have your pharmacy contact our office and allow 72 hours for refills to be completed.    Today you received the following chemotherapy and/or immunotherapy agents Rituxan      To help prevent nausea and vomiting after your treatment, we encourage you to take your nausea medication as directed.  BELOW ARE SYMPTOMS THAT SHOULD BE REPORTED IMMEDIATELY: *FEVER GREATER THAN 100.4 F (38 C) OR HIGHER *CHILLS OR SWEATING *NAUSEA AND VOMITING THAT IS NOT CONTROLLED WITH YOUR NAUSEA MEDICATION *UNUSUAL SHORTNESS OF BREATH *UNUSUAL BRUISING OR BLEEDING *URINARY PROBLEMS (pain or burning when urinating, or frequent urination) *BOWEL PROBLEMS (unusual diarrhea, constipation, pain near the anus) TENDERNESS IN MOUTH AND THROAT WITH OR WITHOUT PRESENCE OF ULCERS (sore throat, sores in mouth, or a toothache) UNUSUAL RASH, SWELLING OR PAIN  UNUSUAL VAGINAL DISCHARGE OR ITCHING   Items with * indicate a potential emergency and should be followed up as soon as possible or go to the Emergency Department if any problems should occur.  Please show the CHEMOTHERAPY ALERT CARD or IMMUNOTHERAPY ALERT CARD at check-in to the Emergency  Department and triage nurse.  Should you have questions after your visit or need to cancel or reschedule your appointment, please contact [facility] [phone]  and follow the prompts.  Office hours are 8:00 a.m. to 4:30 p.m. Monday - Friday. Please note that voicemails left after 4:00 p.m. may not be returned until the following business day.  We are closed weekends and major holidays. You have access to a nurse at all times for urgent questions. Please call the main number to the clinic [phone] and follow the prompts.  For any non-urgent questions, you may also contact your provider using MyChart. We now offer e-Visits for anyone 32 and older to request care online for non-urgent symptoms. For details visit [email or web address].   Also download the MyChart app! Go to the app store, search "MyChart", open the app, select [facility], and log in with your MyChart username and password.  Due to Covid, a mask is required upon entering the hospital/clinic. If you do not have a mask, one will be given to you upon arrival. For doctor visits, patients may have 1 support person aged 65 or older with them. For treatment visits, patients cannot have anyone with them due to current Covid guidelines and our immunocompromised population.

## 2021-04-12 NOTE — Progress Notes (Signed)
Patient presents today for RCHOP per providers order.  Vital signs within parameters for treatment.  Labs pending.  Patient has no new complaints at this time.  Patient to receive Rituxan today and CHOP tomorrow.  Rituxan infusion given today per MD orders.  Stable during infusion without adverse affects.  Vital signs stable.  No complaints at this time.  Discharge from clinic ambulatory in stable condition.  Alert and oriented X 3.  Follow up with [facility] as scheduled.

## 2021-04-12 NOTE — Patient Instructions (Signed)
[place] at [facility] Discharge Instructions   You were seen and examined today by Dr. Delton Coombes.  We will proceed with your treatment today with Rituxan only.  You will return tomorrow to receive the rest of your drugs.  We will bring you back next week to recheck your blood work, and schedule you to receive fluids - you will receive these only if you need them.   Return as scheduled for lab work, office visit, and treatments.    Thank you for choosing [place] at [facility] to provide your oncology and hematology care.  To afford each patient quality time with our provider, please arrive at least 15 minutes before your scheduled appointment time.   If you have a lab appointment with the [facility] please come in thru the Main Entrance and check in at the main information desk.  You need to re-schedule your appointment should you arrive 10 or more minutes late.  We strive to give you quality time with our providers, and arriving late affects you and other patients whose appointments are after yours.  Also, if you no show three or more times for appointments you may be dismissed from the clinic at the providers discretion.     Again, thank you for choosing [facility].  Our hope is that these requests will decrease the amount of time that you wait before being seen by our physicians.       _____________________________________________________________  Should you have questions after your visit to [facility], please contact our office at [phone] and follow the prompts.  Our office hours are 8:00 a.m. and 4:30 p.m. Monday - Friday.  Please note that voicemails left after 4:00 p.m. may not be returned until the following business day.  We are closed weekends and major holidays.  You do have access to a nurse 24-7, just call the main number to the clinic [phone] and do not press any options, hold on the  line and a nurse will answer the phone.    For prescription refill requests, have your pharmacy contact our office and allow 72 hours.    Due to Covid, you will need to wear a mask upon entering the hospital. If you do not have a mask, a mask will be given to you at the Main Entrance upon arrival. For doctor visits, patients may have 1 support person age 22 or older with them. For treatment visits, patients can not have anyone with them due to social distancing guidelines and our immunocompromised population.

## 2021-04-13 ENCOUNTER — Inpatient Hospital Stay (HOSPITAL_COMMUNITY): Payer: Medicare Other

## 2021-04-13 ENCOUNTER — Encounter (HOSPITAL_COMMUNITY): Payer: Self-pay | Admitting: Hematology

## 2021-04-13 VITALS — BP 117/66 | HR 59 | Temp 96.0°F | Resp 20

## 2021-04-13 DIAGNOSIS — C8338 Diffuse large B-cell lymphoma, lymph nodes of multiple sites: Secondary | ICD-10-CM

## 2021-04-13 DIAGNOSIS — Z5112 Encounter for antineoplastic immunotherapy: Secondary | ICD-10-CM | POA: Diagnosis not present

## 2021-04-13 MED ORDER — SODIUM CHLORIDE 0.9 % IV SOLN
10.0000 mg | Freq: Once | INTRAVENOUS | Status: AC
  Administered 2021-04-13: 10 mg via INTRAVENOUS
  Filled 2021-04-13: qty 10

## 2021-04-13 MED ORDER — SODIUM CHLORIDE 0.9% FLUSH: 10.0000 mL | INTRAVENOUS | Status: DC | PRN

## 2021-04-13 MED ORDER — SODIUM CHLORIDE 0.9 % IV SOLN: Freq: Once | INTRAVENOUS | Status: AC

## 2021-04-13 MED ORDER — VINCRISTINE SULFATE CHEMO INJECTION 1 MG/ML
2.0000 mg | Freq: Once | INTRAVENOUS | Status: AC
  Administered 2021-04-13: 2 mg via INTRAVENOUS
  Filled 2021-04-13: qty 2

## 2021-04-13 MED ORDER — SODIUM CHLORIDE 0.9 % IV SOLN
750.0000 mg/m2 | Freq: Once | INTRAVENOUS | Status: AC
  Administered 2021-04-13: 1740 mg via INTRAVENOUS
  Filled 2021-04-13: qty 87

## 2021-04-13 MED ORDER — DOXORUBICIN HCL CHEMO IV INJECTION 2 MG/ML
50.0000 mg/m2 | Freq: Once | INTRAVENOUS | Status: AC
  Administered 2021-04-13: 116 mg via INTRAVENOUS
  Filled 2021-04-13: qty 58

## 2021-04-13 MED ORDER — PALONOSETRON HCL INJECTION 0.25 MG/5ML
0.2500 mg | Freq: Once | INTRAVENOUS | Status: AC
  Administered 2021-04-13: 0.25 mg via INTRAVENOUS
  Filled 2021-04-13: qty 5

## 2021-04-13 MED ORDER — HEPARIN SOD (PORK) LOCK FLUSH 100 UNIT/ML IV SOLN: 500.0000 [IU] | Freq: Once | INTRAVENOUS | Status: DC | PRN

## 2021-04-13 MED ORDER — SODIUM CHLORIDE 0.9 % IV SOLN
150.0000 mg | Freq: Once | INTRAVENOUS | Status: AC
  Administered 2021-04-13: 150 mg via INTRAVENOUS
  Filled 2021-04-13: qty 150

## 2021-04-13 NOTE — Progress Notes (Signed)
Patient presents today for D1C1 CHOP per providers order.  Vital signs and labs within parameters.  Patient has no new complaints at this time.  CHOP given today per MD orders.  Stable during infusion without adverse affects.  Vital signs stable.  No complaints at this time.  Discharge from clinic via wheelchair in stable condition.  Alert and oriented X 3.  Follow up with [facility] as scheduled.

## 2021-04-13 NOTE — Patient Instructions (Signed)
[place]  Discharge Instructions: Thank you for choosing [facility] to provide your oncology and hematology care.  If you have a lab appointment with the [facility], please come in thru the Main Entrance and check in at the main information desk.  Wear comfortable clothing and clothing appropriate for easy access to any Portacath or PICC line.   We strive to give you quality time with your provider. You may need to reschedule your appointment if you arrive late (15 or more minutes).  Arriving late affects you and other patients whose appointments are after yours.  Also, if you miss three or more appointments without notifying the office, you may be dismissed from the clinic at the provider's discretion.      For prescription refill requests, have your pharmacy contact our office and allow 72 hours for refills to be completed.    Today you received the following chemotherapy and/or immunotherapy agents CHOP      To help prevent nausea and vomiting after your treatment, we encourage you to take your nausea medication as directed.  BELOW ARE SYMPTOMS THAT SHOULD BE REPORTED IMMEDIATELY: *FEVER GREATER THAN 100.4 F (38 C) OR HIGHER *CHILLS OR SWEATING *NAUSEA AND VOMITING THAT IS NOT CONTROLLED WITH YOUR NAUSEA MEDICATION *UNUSUAL SHORTNESS OF BREATH *UNUSUAL BRUISING OR BLEEDING *URINARY PROBLEMS (pain or burning when urinating, or frequent urination) *BOWEL PROBLEMS (unusual diarrhea, constipation, pain near the anus) TENDERNESS IN MOUTH AND THROAT WITH OR WITHOUT PRESENCE OF ULCERS (sore throat, sores in mouth, or a toothache) UNUSUAL RASH, SWELLING OR PAIN  UNUSUAL VAGINAL DISCHARGE OR ITCHING   Items with * indicate a potential emergency and should be followed up as soon as possible or go to the Emergency Department if any problems should occur.  Please show the CHEMOTHERAPY ALERT CARD or IMMUNOTHERAPY ALERT CARD at check-in to the Emergency Department  and triage nurse.  Should you have questions after your visit or need to cancel or reschedule your appointment, please contact [facility] [phone]  and follow the prompts.  Office hours are 8:00 a.m. to 4:30 p.m. Monday - Friday. Please note that voicemails left after 4:00 p.m. may not be returned until the following business day.  We are closed weekends and major holidays. You have access to a nurse at all times for urgent questions. Please call the main number to the clinic [phone] and follow the prompts.  For any non-urgent questions, you may also contact your provider using MyChart. We now offer e-Visits for anyone 71 and older to request care online for non-urgent symptoms. For details visit [email or web address].   Also download the MyChart app! Go to the app store, search "MyChart", open the app, select [place], and log in with your MyChart username and password.  Due to Covid, a mask is required upon entering the hospital/clinic. If you do not have a mask, one will be given to you upon arrival. For doctor visits, patients may have 1 support person aged 27 or older with them. For treatment visits, patients cannot have anyone with them due to current Covid guidelines and our immunocompromised population.

## 2021-04-14 ENCOUNTER — Other Ambulatory Visit: Payer: Self-pay

## 2021-04-14 ENCOUNTER — Ambulatory Visit (HOSPITAL_COMMUNITY): Payer: Medicare Other

## 2021-04-14 ENCOUNTER — Inpatient Hospital Stay (HOSPITAL_COMMUNITY): Payer: Medicare Other

## 2021-04-14 VITALS — BP 112/59 | HR 72 | Temp 97.1°F | Resp 18

## 2021-04-14 DIAGNOSIS — Z5112 Encounter for antineoplastic immunotherapy: Secondary | ICD-10-CM | POA: Diagnosis not present

## 2021-04-14 DIAGNOSIS — C8338 Diffuse large B-cell lymphoma, lymph nodes of multiple sites: Secondary | ICD-10-CM

## 2021-04-14 MED ORDER — PEGFILGRASTIM-BMEZ 6 MG/0.6ML ~~LOC~~ SOSY
6.0000 mg | PREFILLED_SYRINGE | Freq: Once | SUBCUTANEOUS | Status: AC
  Administered 2021-04-14: 6 mg via SUBCUTANEOUS
  Filled 2021-04-14: qty 0.6

## 2021-04-14 NOTE — Progress Notes (Signed)
[name] presents today for injection per the provider's orders.  Ziextenzo administration without incident; injection site WNL; see MAR for injection details.  Patient tolerated procedure well and without incident.  No questions or complaints noted at this time.   Ziextenzo given today per MD orders. Tolerated infusion without adverse affects. Vital signs stable. No complaints at this time. Discharged from clinic via wheelchair in stable condition. Alert and oriented x 3. F/U with [facility] as scheduled.

## 2021-04-14 NOTE — Progress Notes (Signed)
24 hour call back, 04/14/21,  Patient here for Udenyca injection.  Patient has been doing and feeling well since his treatment.  No nausea, vomiting or diarrhea, and his appetite has been good.

## 2021-04-16 ENCOUNTER — Other Ambulatory Visit (HOSPITAL_COMMUNITY): Payer: Self-pay | Admitting: *Deleted

## 2021-04-19 ENCOUNTER — Ambulatory Visit: Payer: Medicare Other | Admitting: "Endocrinology

## 2021-04-20 ENCOUNTER — Inpatient Hospital Stay (HOSPITAL_BASED_OUTPATIENT_CLINIC_OR_DEPARTMENT_OTHER): Payer: Medicare Other | Admitting: Physician Assistant

## 2021-04-20 ENCOUNTER — Inpatient Hospital Stay (HOSPITAL_COMMUNITY): Payer: Medicare Other

## 2021-04-20 ENCOUNTER — Other Ambulatory Visit: Payer: Self-pay

## 2021-04-20 VITALS — BP 90/63 | HR 85 | Temp 96.8°F | Resp 18 | Ht 68.9 in | Wt 223.3 lb

## 2021-04-20 DIAGNOSIS — T451X5A Adverse effect of antineoplastic and immunosuppressive drugs, initial encounter: Secondary | ICD-10-CM

## 2021-04-20 DIAGNOSIS — B0239 Other herpes zoster eye disease: Secondary | ICD-10-CM

## 2021-04-20 DIAGNOSIS — D701 Agranulocytosis secondary to cancer chemotherapy: Secondary | ICD-10-CM | POA: Diagnosis not present

## 2021-04-20 DIAGNOSIS — R634 Abnormal weight loss: Secondary | ICD-10-CM

## 2021-04-20 DIAGNOSIS — Z09 Encounter for follow-up examination after completed treatment for conditions other than malignant neoplasm: Secondary | ICD-10-CM

## 2021-04-20 DIAGNOSIS — Z5112 Encounter for antineoplastic immunotherapy: Secondary | ICD-10-CM | POA: Diagnosis not present

## 2021-04-20 DIAGNOSIS — K5900 Constipation, unspecified: Secondary | ICD-10-CM

## 2021-04-20 DIAGNOSIS — C8330 Diffuse large B-cell lymphoma, unspecified site: Secondary | ICD-10-CM

## 2021-04-20 DIAGNOSIS — C8338 Diffuse large B-cell lymphoma, lymph nodes of multiple sites: Secondary | ICD-10-CM

## 2021-04-20 DIAGNOSIS — E860 Dehydration: Secondary | ICD-10-CM

## 2021-04-20 LAB — COMPREHENSIVE METABOLIC PANEL
ALT: 40 U/L (ref 0–44)
AST: 27 U/L (ref 15–41)
Albumin: 3.2 g/dL — ABNORMAL LOW (ref 3.5–5.0)
Alkaline Phosphatase: 81 U/L (ref 38–126)
Anion gap: 8 (ref 5–15)
BUN: 21 mg/dL (ref 8–23)
CO2: 33 mmol/L — ABNORMAL HIGH (ref 22–32)
Calcium: 9.1 mg/dL (ref 8.9–10.3)
Chloride: 93 mmol/L — ABNORMAL LOW (ref 98–111)
Creatinine, Ser: 0.61 mg/dL (ref 0.61–1.24)
GFR, Estimated: >60 mL/min (ref >60)
Glucose, Bld: 101 mg/dL — ABNORMAL HIGH (ref 70–99)
Potassium: 4.1 mmol/L (ref 3.5–5.1)
Sodium: 134 mmol/L — ABNORMAL LOW (ref 135–145)
Total Bilirubin: 1.6 mg/dL — ABNORMAL HIGH (ref 0.3–1.2)
Total Protein: 5.9 g/dL — ABNORMAL LOW (ref 6.5–8.1)

## 2021-04-20 LAB — CBC WITH DIFFERENTIAL/PLATELET
Abs Immature Granulocytes: 0 10*3/uL (ref 0.00–0.07)
Basophils Absolute: 0 10*3/uL (ref 0.0–0.1)
Basophils Relative: 1 %
Eosinophils Absolute: 0 10*3/uL (ref 0.0–0.5)
Eosinophils Relative: 3 %
HCT: 33.3 % — ABNORMAL LOW (ref 39.0–52.0)
Hemoglobin: 10.7 g/dL — ABNORMAL LOW (ref 13.0–17.0)
Immature Granulocytes: 0 %
Lymphocytes Relative: 86 %
Lymphs Abs: 0.7 10*3/uL (ref 0.7–4.0)
MCH: 31.9 pg (ref 26.0–34.0)
MCHC: 32.1 g/dL (ref 30.0–36.0)
MCV: 99.4 fL (ref 80.0–100.0)
Monocytes Absolute: 0 10*3/uL — ABNORMAL LOW (ref 0.1–1.0)
Monocytes Relative: 3 %
Neutro Abs: 0.1 10*3/uL — CL (ref 1.7–7.7)
Neutrophils Relative %: 7 %
Platelets: 123 10*3/uL — ABNORMAL LOW (ref 150–400)
RBC: 3.35 MIL/uL — ABNORMAL LOW (ref 4.22–5.81)
RDW: 19.5 % — ABNORMAL HIGH (ref 11.5–15.5)
WBC Morphology: REACTIVE
WBC: 0.8 10*3/uL — CL (ref 4.0–10.5)
nRBC: 0 % (ref 0.0–0.2)

## 2021-04-20 LAB — PHOSPHORUS: Phosphorus: 3.8 mg/dL (ref 2.5–4.6)

## 2021-04-20 LAB — LACTATE DEHYDROGENASE: LDH: 122 U/L (ref 98–192)

## 2021-04-20 LAB — MAGNESIUM: Magnesium: 2 mg/dL (ref 1.7–2.4)

## 2021-04-20 LAB — URIC ACID: Uric Acid, Serum: 4.5 mg/dL (ref 3.7–8.6)

## 2021-04-20 MED ORDER — SODIUM CHLORIDE 0.9 % IV SOLN: INTRAVENOUS | Status: AC

## 2021-04-20 MED ORDER — MAGNESIUM CITRATE PO SOLN: 1.0000 | Freq: Once | ORAL | 0 refills | Status: AC

## 2021-04-20 MED ORDER — DOCUSATE SODIUM 100 MG PO CAPS
200.0000 mg | ORAL_CAPSULE | Freq: Two times a day (BID) | ORAL | 0 refills | Status: DC
Start: 2021-04-20 — End: 2021-06-08

## 2021-04-20 MED ORDER — HEPARIN SOD (PORK) LOCK FLUSH 100 UNIT/ML IV SOLN
500.0000 [IU] | Freq: Once | INTRAVENOUS | Status: AC
  Administered 2021-04-20: 500 [IU] via INTRAVENOUS

## 2021-04-20 MED ORDER — SODIUM CHLORIDE 0.9% FLUSH
10.0000 mL | INTRAVENOUS | Status: DC | PRN
  Administered 2021-04-20: 10 mL via INTRAVENOUS

## 2021-04-20 NOTE — Progress Notes (Signed)
Roy S. [address] [place] [address] Phone: [phone] Fax: [place] PROGRESS NOTE   Roy [name] [id number] [birth year] 70 y.o.  Roy [name] is managed by Dr. Delton Coombes for diffuse large B-cell lymphoma, stage IVb.  Actively treated with chemotherapy/immunotherapy/hormonal therapy: YES  Current therapy: R-CHOP  Last treated: 04/12/2021  Next scheduled appointment with provider: 05/03/2021  Subjective:  Chief Complaint: Follow-up after cycle #1 of chemotherapy  Roy [name] is managed by Dr. Delton Coombes for diffuse large B-cell lymphoma, stage IVb.  He  reports that he  tolerated his  chemotherapy infusions very well and did not have any adverse symptoms during the infusions.  He reports that he has felt fairly well following his chemotherapy infusions as well.  His chief concern today is constipation.  He reports that he has not had a bowel movement in 3+ days despite taking lactulose every 3 hours x 5 doses.  He reports that he has a feeling of rectal urgency and that he feels like "something is there that wants to come out," but that he is unable to push it out of his rectum.  He reports that he is still passing gas.  No nausea, vomiting, abdominal pain.  No mouth sores.  No rash or skin changes.  No peripheral edema, chest pain, shortness of breath, or dyspnea on exertion.  He denies any bruising or bleeding.  No fever or chills.  He denies any new joint pain or headaches.  He does have chronic neuropathy in his hands and feet, which he reports is unchanged after chemo.  He does report some mild fatigue following his chemotherapy.  He has about 60% energy and reports that he feels "more sleepy."  He states that he has a 100% appetite and is eating "plenty of food," although he is noted to have lost about 4 pounds since his visit a week ago.  He has been drinking less water over the last week, he  estimates that he drinks about 32 ounces per day.   Review of Systems:  Review of Systems  Constitutional:  Positive for fatigue. Negative for appetite change, chills, diaphoresis and fever.  HENT:  Positive for congestion. Negative for trouble swallowing.   Respiratory:  Positive for cough (chronic). Negative for shortness of breath.   Cardiovascular:  Negative for chest pain and leg swelling.  Gastrointestinal:  Positive for constipation. Negative for abdominal pain, diarrhea, nausea and vomiting.  Musculoskeletal:  Negative for arthralgias, back pain and myalgias.  Skin:  Negative for rash.  Neurological:  Positive for numbness (chronic).  Psychiatric/Behavioral:  Negative for dysphoric mood. The patient is not nervous/anxious.     Past Medical History, Surgical history, Social history, and Family history were reviewed as documented elsewhere in chart, and were updated as appropriate.   Objective:   Physical Exam:  BP 90/63 (BP Location: Left Arm, Patient Position: Sitting)    Pulse 85    Temp (!) 96.8 F (36 C) (Tympanic)    Resp 18    Ht 5' 8.9" (1.75 m)    Wt 223 lb 5.2 oz (101.3 kg)    SpO2 100%    BMI 33.08 kg/m  ECOG: 1  Physical Exam Constitutional:      Appearance: Normal appearance. He is obese.  HENT:     Head: Normocephalic and atraumatic.     Mouth/Throat:     Mouth: Mucous membranes are moist.  Eyes:  Extraocular Movements: Extraocular movements intact.     Conjunctiva/sclera:     Left eye: Left conjunctiva is injected.     Pupils: Pupils are equal, round, and reactive to light.     Comments: Left eye conjunctival erythema and 2 small scabs on left lateral side of nose just below the eye  Cardiovascular:     Rate and Rhythm: Normal rate and regular rhythm.     Pulses: Normal pulses.     Heart sounds: Normal heart sounds.  Pulmonary:     Effort: Pulmonary effort is normal.     Breath sounds: Normal breath sounds.  Abdominal:     General: Bowel sounds  are normal. There is no distension.     Palpations: Abdomen is soft.     Tenderness: There is no abdominal tenderness.     Comments: Bowel sounds present in all 4 quadrants  Musculoskeletal:        General: No swelling.     Right lower leg: No edema.     Left lower leg: No edema.  Lymphadenopathy:     Cervical: No cervical adenopathy.  Skin:    General: Skin is warm and dry.  Neurological:     General: No focal deficit present.     Mental Status: He is alert and oriented to person, place, and time.  Psychiatric:        Mood and Affect: Mood normal.        Behavior: Behavior normal.    Lab Review:     Component Value Date/Time   NA 133 (L) 04/12/2021 0816   K 4.1 04/12/2021 0816   CL 96 (L) 04/12/2021 0816   CO2 29 04/12/2021 0816   GLUCOSE 139 (H) 04/12/2021 0816   BUN 14 04/12/2021 0816   CREATININE 0.65 04/12/2021 0816   CREATININE 0.82 11/11/2020 1046   CALCIUM 8.5 (L) 04/12/2021 0816   PROT 6.2 (L) 04/12/2021 0816   ALBUMIN 3.0 (L) 04/12/2021 0816   AST 39 04/12/2021 0816   AST 74 (H) 11/11/2020 1046   ALT 33 04/12/2021 0816   ALT 21 11/11/2020 1046   ALKPHOS 125 04/12/2021 0816   BILITOT 1.1 04/12/2021 0816   BILITOT 2.3 (H) 11/11/2020 1046   GFRNONAA >60 04/12/2021 0816   GFRNONAA >60 11/11/2020 1046   GFRAA >60 04/29/2019 1033       Component Value Date/Time   WBC 5.1 04/12/2021 0816   RBC 3.46 (L) 04/12/2021 0816   HGB 10.9 (L) 04/12/2021 0816   HCT 34.4 (L) 04/12/2021 0816   PLT 130 (L) 04/12/2021 0816   MCV 99.4 04/12/2021 0816   MCH 31.5 04/12/2021 0816   MCHC 31.7 04/12/2021 0816   RDW 20.8 (H) 04/12/2021 0816   LYMPHSABS 2.2 04/12/2021 0816   MONOABS 0.2 04/12/2021 0816   EOSABS 0.0 04/12/2021 0816   BASOSABS 0.1 04/12/2021 0816   -------------------------------  Imaging from last 24 hours (if applicable):  Radiology interpretation: CT SOFT TISSUE NECK W CONTRAST  Result Date: 03/29/2021 CLINICAL DATA:  Parotid mass. EXAM: CT NECK  WITH CONTRAST TECHNIQUE: Multidetector CT imaging of the neck was performed using the standard protocol following the bolus administration of intravenous contrast. CONTRAST:  27m OMNIPAQUE IOHEXOL 300 MG/ML  SOLN COMPARISON:  PET scan 03/16/2021 FINDINGS: Pharynx and larynx: Focal mucosal or submucosal lesions are present. The nasopharynx clear. Soft palate and tongue base are within normal limits. Palatine tonsils are within normal limits. Vallecula and epiglottis are within normal limits. Aryepiglottic folds and  piriform sinuses are clear. Vocal cords are midline and symmetric. Trachea is clear. Salivary glands: 2 distinct lesions are present in the posterior right parotid gland. More superior lesion measures 10 x 10 x 11 mm. More inferior and medial the lesion measures 14 x 10 by 17 mm. In the deep lobe of the left parotid gland is a similar homogeneously enhancing lesion measuring 2.2 x 1.1 x 1.7 cm. Medial and superior is a more peripherally enhancing and slightly less distinct lesion measuring 12 x 8 x 7 mm. Submandibular glands and ducts are within normal limits. Thyroid: Negative. Lymph nodes: No significant cervical adenopathy is present. Vascular: Atherosclerotic calcifications are present at the carotid bifurcations bilaterally without significant stenosis. Limited intracranial: Normal limits. Visualized orbits: Bilateral lens replacements are noted. A remote inferior orbital blowout fracture is present left. Globes and orbits are otherwise unremarkable. Mastoids and visualized paranasal sinuses: Left maxillary sinuses chronically shrunken. No active disease is present. The paranasal sinuses and mastoid air cells are otherwise clear. Skeleton: Neck listhesis of the cervical and thoracic spine noted. No focal osseous lesions are present. Extensive ossification of posterior longitudinal ligament results in central canal stenosis is greatest at C4-5 and C5-6. The patient is edentulous. Upper chest: The lung  apices are clear. Thoracic inlet is within normal limits. Atherosclerotic calcifications are noted at the aortic arch. IMPRESSION: 1. Three homogeneously enhancing lesions within the posterior right parotid gland and deep lobe of the left parotid gland are most concerning for Warthin's tumors. Lesions appear benign. 2. More aggressive lesion in the left parotid gland measures 12 mm maximally. This may represent additional Warthin's tumor. Parotid malignancy metastatic disease not excluded. 3. No significant cervical adenopathy. 4. 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 5. Remote inferior left orbital blowout fracture. 6. Aortic Atherosclerosis (ICD10-I70.0). Electronically Signed   By: San Morelle M.D.   On: 03/29/2021 11:23   IR IMAGING GUIDED PORT INSERTION  Result Date: 03/31/2021 INDICATION: Lymphoma. EXAM: IMPLANTED PORT A CATH PLACEMENT WITH ULTRASOUND AND FLUOROSCOPIC GUIDANCE MEDICATIONS: None. ANESTHESIA/SEDATION: Moderate (conscious) sedation was employed during this procedure. A total of Versed 1.5 mg and Fentanyl 75 mcg was administered intravenously. Moderate Sedation Time: 29 minutes. The patient's level of consciousness and vital signs were monitored continuously by radiology nursing throughout the procedure under my direct supervision. FLUOROSCOPY TIME:  0 minutes, 13 seconds (1 mGy) COMPLICATIONS: None immediate. PROCEDURE: The procedure, risks, benefits, and alternatives were explained to the patient. Questions regarding the procedure were encouraged and answered. The patient understands and consents to the procedure. The neck and chest were prepped with chlorhexidine in a sterile fashion, and a sterile drape was applied covering the operative field. Maximum barrier sterile technique with sterile gowns and gloves were used for the procedure. A timeout was performed prior to the initiation of  the procedure. Local anesthesia was provided with 1% lidocaine with epinephrine. After creating a small venotomy incision, a micropuncture kit was utilized to access the internal jugular vein under direct, real-time ultrasound guidance. Ultrasound image documentation was performed. The microwire was kinked to measure appropriate catheter length. A subcutaneous port pocket was then created along the upper chest wall utilizing a combination of sharp and blunt dissection. The pocket was irrigated with sterile saline. A single lumen ISP power injectable port was chosen for placement. The 8 Fr catheter was tunneled from the port pocket site to the venotomy incision. The port was placed in  the pocket. The external catheter was trimmed to appropriate length. At the venotomy, an 8 Fr peel-away sheath was placed over a guidewire under fluoroscopic guidance. The catheter was then placed through the sheath and the sheath was removed. Final catheter positioning was confirmed and documented with a fluoroscopic spot radiograph. The port was accessed with a Huber needle, aspirated and flushed with heparinized saline. The port pocket incision was closed with interrupted 3-0 Vicryl suture then Dermabond was applied, including at the venotomy incision. Dressings were placed. The patient tolerated the procedure well without immediate post procedural complication. IMPRESSION: Successful placement of a right internal jugular approach power injectable Port-A-Cath. The catheter is ready for immediate use. Michaelle Birks, MD [facility] Radiology Electronically Signed   By: Michaelle Birks M.D.   On: 03/31/2021 13:32      Assessment & Plan:    1.  Stage IVb diffuse large B-cell lymphoma involving bone marrow, spleen - Primary oncologist is Dr. Delton Coombes.  Further details per his note from 04/12/2021. - Patient received first cycle of R-CHOP on 04/12/2021-04/13/2021 with pegfilgrastim given on  04/14/2021 - Labs today (04/20/2021): Normal phosphorus, uric acid, LDH, magnesium, potassium.  Renal function at baseline.  LFTs grossly normal with mildly elevated bilirubin 1.6. - CBC today (04/20/2021) shows a critical neutropenia with WBC 0.8 and ANC 0.1.  Mild normocytic anemia is stable with Hgb 10.7.  Platelets are improved at 123. - PLAN: Patient will follow up with Dr. Delton Coombes as scheduled for 05/03/2021 for labs, MD visit, next cycle of chemo.  2.  Neutropenia - Critical neutropenia (04/20/2021) with WBC 0.8 and ANC 0.1 - Patient denies any fever, chills, cough, dysuria, or other signs of systemic infection. - Patient received pegfilgrastim on 04/14/2021 - PLAN: Patient educated on neutropenic precautions, educational handout sent home with patient.  3.  Constipation with suspected fecal impaction - He has not had a bowel movement in 3+ days despite taking lactulose every 3 hours x 5 doses. - He reports that he has a feeling of rectal urgency and that he feels like "something is there that wants to come out," but that he is unable to push it out of his rectum. - He reports that he is still passing gas.  Bowel sounds present on exam. - No nausea, vomiting, abdominal pain.  - PLAN: Unable to give enema, suppository, or digital disimpaction due to critical neutropenia. - Instructed patient to continue lactulose 30 mL every 3 hours with the addition of one-time dose of magnesium citrate. - We will follow back with patient via RN phone call on Monday and provide further instructions at that time if needed.  4.  Dehydration - Patient drinking about 32 ounces of water daily, likely to become more dehydrated with treatment of constipation (see below) - Marginal blood pressure noted with 90/63 today, heart rate 95 - PLAN: 1 L NS given in clinic today. - Patient instructed to drink at least 64 ounces of water daily.  5.  Optic shingles -Patient saw ophthalmologist last week and was  diagnosed with shingles of his left eye - PLAN: Continue erythromycin ointment and valacyclovir as prescribed by ophthalmologist.  6.  Weight loss -Patient reports 100% appetite and reports that he is "eating pretty good" - Weight is down 4 pounds since last week - PLAN: Educated on adequate nutrition.  We will continue to check weight at follow-up visit.  Referral to dietitian if he has continued weight loss.  All questions  were answered. The patient knows to call the clinic with any problems, questions or concerns.  Medical decision making: Moderate  Time spent on visit: I spent 20 minutes counseling the patient face to face. The total time spent in the appointment was 30 minutes and more than 50% was on counseling.   Harriett Rush, PA-C  04/20/2021 11:46 PM

## 2021-04-20 NOTE — Patient Instructions (Signed)
[facility]  Discharge Instructions: Thank you for choosing [facility] to provide your oncology and hematology care.  If you have a lab appointment with the [facility], please come in thru the Main Entrance and check in at the main information desk.  Wear comfortable clothing and clothing appropriate for easy access to any Portacath or PICC line.   We strive to give you quality time with your provider. You may need to reschedule your appointment if you arrive late (15 or more minutes).  Arriving late affects you and other patients whose appointments are after yours.  Also, if you miss three or more appointments without notifying the office, you may be dismissed from the clinic at the providers discretion.      For prescription refill requests, have your pharmacy contact our office and allow 72 hours for refills to be completed.    Today you received the 1 Liter of Normal saline over 2 hours.     BELOW ARE SYMPTOMS THAT SHOULD BE REPORTED IMMEDIATELY: *FEVER GREATER THAN 100.4 F (38 C) OR HIGHER *CHILLS OR SWEATING *NAUSEA AND VOMITING THAT IS NOT CONTROLLED WITH YOUR NAUSEA MEDICATION *UNUSUAL SHORTNESS OF BREATH *UNUSUAL BRUISING OR BLEEDING *URINARY PROBLEMS (pain or burning when urinating, or frequent urination) *BOWEL PROBLEMS (unusual diarrhea, constipation, pain near the anus) TENDERNESS IN MOUTH AND THROAT WITH OR WITHOUT PRESENCE OF ULCERS (sore throat, sores in mouth, or a toothache) UNUSUAL RASH, SWELLING OR PAIN  UNUSUAL VAGINAL DISCHARGE OR ITCHING   Items with * indicate a potential emergency and should be followed up as soon as possible or go to the Emergency Department if any problems should occur.  Please show the CHEMOTHERAPY ALERT CARD or IMMUNOTHERAPY ALERT CARD at check-in to the Emergency Department and triage nurse.  Should you have questions after your visit or need to cancel or reschedule your appointment, please contact [facility] [phone]  and follow the prompts.  Office hours are 8:00 a.m. to 4:30 p.m. Monday - Friday. Please note that voicemails left after 4:00 p.m. may not be returned until the following business day.  We are closed weekends and major holidays. You have access to a nurse at all times for urgent questions. Please call the main number to the clinic [phone] and follow the prompts.  For any non-urgent questions, you may also contact your provider using MyChart. We now offer e-Visits for anyone 72 and older to request care online for non-urgent symptoms. For details visit [email or web address].   Also download the MyChart app! Go to the app store, search "MyChart", open the app, select [facility], and log in with your MyChart username and password.  Due to Covid, a mask is required upon entering the hospital/clinic. If you do not have a mask, one will be given to you upon arrival. For doctor visits, patients may have 1 support person aged 35 or older with them. For treatment visits, patients cannot have anyone with them due to current Covid guidelines and our immunocompromised population.

## 2021-04-20 NOTE — Progress Notes (Signed)
Pt present today for possible IV fluids today per provider's order. Vital signs stable and pt voiced no new complaints at this time. Message received from R.Pennington-PA for pt to receive 1 liter of NS over 2 hours.  1 Liter of Normal Saline over 2 hours given today per MD orders. Tolerated infusion without adverse affects. Vital signs stable. No complaints at this time. Discharged from clinic via wheelchair in stable condition. Alert and oriented x 3. F/U with [facility] as scheduled.

## 2021-04-20 NOTE — Progress Notes (Signed)
CRITICAL VALUE ALERT Critical value received:  WBC 0.8  ANC 0.1 Date of notification:  04/20/21 Time of notification: 1150 Critical value read back:  Yes.   Nurse who received alert:  C. Chamari Cutbirth RN MD notified time and response:  [id number], no change

## 2021-04-20 NOTE — Patient Instructions (Addendum)
[place] at [facility] Discharge Instructions  You were seen today by Tarri Abernethy PA-C for your symptoms checkup after your first cycle of chemotherapy.  CONSTIPATION: - Continue to take lactulose 30 mL every 3 hours until you have had a good bowel movement. - In addition to lactulose, take 1 bottle of magnesium citrate x1 dose. - If you have diarrhea, you should STOP taking lactulose. - Once you have had a good bowel movement to resolve your constipation, you can take Colace 2 tablets twice daily as well as lactulose nightly to maintain good bowel movements. - Because of your low white blood cell count, it is currently NOT SAFE for you to have any enemas or suppositories.  If you continue to be constipated next week, please call our office for further advice at that time.  LOW WHITE BLOOD CELLS: Your white blood cells are very low right now.  This places you at a very high risk of infection.  Please read carefully through the attached handout regarding your low white blood cells (called "neutropenia").  Call our office IMMEDIATELY if you have any fever, chills, or other symptoms that concern you.  DEHYDRATION: You were given IV fluids today due to your dehydration.  Make sure that you are drinking at least 64 ounces of water per day.  SHINGLES: Continue to take the valacyclovir pills and erythromycin eye ointment as prescribed by your eye doctor for your shingles infection of your left eye.  FOLLOW-UP APPOINTMENT: Follow-up as previously scheduled on Wednesday, 05/03/2021 for labs, MD visit with Dr. Delton Coombes, and your next cycle of chemotherapy.   Thank you for choosing [facility] at [facility] to provide your oncology and hematology care.  To afford each patient quality time with our provider, please arrive at least 15 minutes before your scheduled appointment time.   If you have a lab appointment with the [facility] please come  in thru the Main Entrance and check in at the main information desk.  You need to re-schedule your appointment should you arrive 10 or more minutes late.  We strive to give you quality time with our providers, and arriving late affects you and other patients whose appointments are after yours.  Also, if you no show three or more times for appointments you may be dismissed from the clinic at the providers discretion.     Again, thank you for choosing [facility].  Our hope is that these requests will decrease the amount of time that you wait before being seen by our physicians.       _____________________________________________________________  Should you have questions after your visit to [facility], please contact our office at [phone] and follow the prompts.  Our office hours are 8:00 a.m. and 4:30 p.m. Monday - Friday.  Please note that voicemails left after 4:00 p.m. may not be returned until the following business day.  We are closed weekends and major holidays.  You do have access to a nurse 24-7, just call the main number to the clinic [phone] and do not press any options, hold on the line and a nurse will answer the phone.    For prescription refill requests, have your pharmacy contact our office and allow 72 hours.    Due to Covid, you will need to wear a mask upon entering the hospital. If you do not have a mask, a mask will be given to you at the  Main Entrance upon arrival. For doctor visits, patients may have 1 support person age 72 or older with them. For treatment visits, patients can not have anyone with them due to social distancing guidelines and our immunocompromised population.

## 2021-04-24 ENCOUNTER — Telehealth (HOSPITAL_COMMUNITY): Payer: Self-pay | Admitting: *Deleted

## 2021-04-24 NOTE — Telephone Encounter (Signed)
Followed up with patient regarding ongoing constipation.  States that he has followed regimen, as suggested by Tarri Abernethy - PAC and he has had 2 normal bowel movements daily since.  Advised to let us know if he has any further issues.

## 2021-04-25 ENCOUNTER — Ambulatory Visit (HOSPITAL_COMMUNITY): Payer: Medicare Other | Admitting: Hematology

## 2021-05-01 ENCOUNTER — Encounter (HOSPITAL_COMMUNITY): Payer: Self-pay | Admitting: Hematology

## 2021-05-02 NOTE — Progress Notes (Signed)
[facility] [address], [place] [address]   CLINIC:  Medical Oncology/Hematology  PCP:  Rebecka Apley, NP [address] / [place] [address] [phone]   REASON FOR VISIT:  Follow-up for leukopenia and thrombocytopenia   PRIOR THERAPY: none  NGS Results: not done  CURRENT THERAPY: R-CHOP every 3 weeks  BRIEF ONCOLOGIC HISTORY:  Oncology History  DLBCL (diffuse large B cell lymphoma) (HCC)  03/22/2021 Initial Diagnosis   DLBCL (diffuse large B cell lymphoma) (HCC)   03/22/2021 Cancer Staging   Staging form: Hodgkin and Non-Hodgkin Lymphoma, AJCC 8th Edition - Clinical stage from 03/22/2021: Stage IV (Diffuse large B-cell lymphoma) - Signed by Doreatha Massed, MD on 03/22/2021    04/12/2021 -  Chemotherapy   Patient is on Treatment Plan : NON-HODGKINS LYMPHOMA R-CHOP q21d       CANCER STAGING:  Cancer Staging  DLBCL (diffuse large B cell lymphoma) (HCC) Staging form: Hodgkin and Non-Hodgkin Lymphoma, AJCC 8th Edition - Clinical stage from 03/22/2021: Stage IV (Diffuse large B-cell lymphoma) - Signed by Doreatha Massed, MD on 03/22/2021   INTERVAL HISTORY:  Roy [name], a 70 y.o. male, returns for routine follow-up and consideration for next cycle of chemotherapy. Akshay was last seen on 04/12/2021.  Due for cycle #2 of R-CHOP today.   Overall, he tells me he has been feeling pretty well. Today he reports feeling good n/v/d. He had constipation which has been resolved with lactulose and Colace daily. This morning his son made a mistake with his medication, and he ended up taking 5 Compazine tablets instead of Colace tablets. He reports ankle swellings. He reports stable numbness in his feet. He has started drinking four 16 ounce water bottles daily.   Overall, he feels ready for next cycle of chemo today.   REVIEW OF SYSTEMS:  Review of Systems  Constitutional:  Negative for appetite change (80%)  and fatigue (60%).  Cardiovascular:  Positive for leg swelling (ankles).  Gastrointestinal:  Negative for diarrhea, nausea and vomiting.  Neurological:  Positive for numbness (feet).  All other systems reviewed and are negative.  PAST MEDICAL/SURGICAL HISTORY:  Past Medical History:  Diagnosis Date   Atrial fibrillation (HCC)    GERD (gastroesophageal reflux disease)    High cholesterol    Hypertension    Obesity    Past Surgical History:  Procedure Laterality Date   APPENDECTOMY     CATARACT EXTRACTION Bilateral 2022   CHOLECYSTECTOMY     ESOPHAGEAL DILATION     multiple times   IR IMAGING GUIDED PORT INSERTION  03/31/2021   JOINT REPLACEMENT Left    hip    SOCIAL HISTORY:  Social History   Socioeconomic History   Marital status: Widowed    Spouse name: Not on file   Number of children: 3   Years of education: Not on file   Highest education level: Not on file  Occupational History   Occupation: Retired  Tobacco Use   Smoking status: Never   Smokeless tobacco: Never  Vaping Use   Vaping Use: Never used  Substance and Sexual Activity   Alcohol use: No   Drug use: No   Sexual activity: Not Currently  Other Topics Concern   Not on file  Social History Narrative   Not on file   Social Determinants of Health   Financial Resource Strain: Low Risk    Difficulty of Paying Living Expenses: Not hard at all  Food Insecurity: No Food Insecurity   Worried About Charity fundraiser in the Last Year: Never true   Ran Out of Food in the Last Year: Never true  Transportation Needs: No Transportation Needs   Lack of Transportation (Medical): No   Lack of Transportation (Non-Medical): No  Physical Activity: Sufficiently Active   Days of Exercise per Week: 5 days   Minutes of Exercise per Session: 30 min  Stress: No Stress Concern Present   Feeling of Stress : Not at all  Social Connections: Moderately Isolated   Frequency of Communication with Friends and Family:  More than three times a week   Frequency of Social Gatherings with Friends and Family: Three times a week   Attends Religious Services: 1 to 4 times per year   Active Member of Clubs or Organizations: No   Attends Archivist Meetings: Never   Marital Status: Widowed  Human resources officer Violence: Not At Risk   Fear of Current or Ex-Partner: No   Emotionally Abused: No   Physically Abused: No   Sexually Abused: No    FAMILY HISTORY:  Family History  Problem Relation Age of Onset   Heart failure Father    Heart attack Father        Deceased    Thyroid disease Sister     CURRENT MEDICATIONS:  Current Outpatient Medications  Medication Sig Dispense Refill   allopurinol (ZYLOPRIM) 300 MG tablet Take 1 tablet (300 mg total) by mouth daily. 30 tablet 3   Calcium Carbonate-Vitamin D (CALCIUM-VITAMIN D) 600-125 MG-UNIT TABS Take by mouth.     Cholecalciferol (VITAMIN D3) 25 MCG (1000 UT) CAPS Take by mouth daily.     CYCLOPHOSPHAMIDE IV Inject into the vein every 21 ( twenty-one) days.     docusate sodium (COLACE) 100 MG capsule Take 2 capsules (200 mg total) by mouth 2 (two) times daily. 120 capsule 0   DOXORUBICIN HCL IV Inject into the vein every 21 ( twenty-one) days.     erythromycin ophthalmic ointment SMARTSIG:1 In Eye(s) Every Night     ferrous sulfate 325 (65 FE) MG EC tablet Take 1 tablet (325 mg total) by mouth daily with breakfast. 30 tablet 3   gabapentin (NEURONTIN) 100 MG capsule Take 1 capsule (100 mg total) by mouth 2 (two) times daily. 60 capsule 3   Lactulose 20 GM/30ML SOLN Take 30 ml by mouth every 3 hours until a bowel movement occurs.  Then take 30 ml by mouth at bedtime. 450 mL 2   levothyroxine (SYNTHROID) 50 MCG tablet Take 1 tablet (50 mcg total) by mouth daily before breakfast. 90 tablet 1   lidocaine (XYLOCAINE) 2 % jelly Apply to affected area daily prn     lidocaine-prilocaine (EMLA) cream Apply a small amount to port a cath site and cover with  plastic wrap 1 hour prior to chemotherapy appointments 30 g 3   magnesium oxide (MAG-OX) 400 (240 Mg) MG tablet Take 1 tablet (400 mg total) by mouth 2 (two) times daily. 60 tablet 1   midodrine (PROAMATINE) 5 MG tablet Take 1 tablet (5 mg total) by mouth 3 (three) times daily with meals. 90 tablet 2   omeprazole (PRILOSEC) 10 MG capsule Take 1 capsule by mouth daily.     potassium chloride (KLOR-CON) 10 MEQ tablet Take 10 mEq by mouth 2 (two) times daily.     prednisoLONE acetate (PRED FORTE) 1 % ophthalmic suspension Place into the left eye.  predniSONE (DELTASONE) 10 MG tablet Take 1 tablet (10 mg total) by mouth daily with breakfast. 95 tablet 1   predniSONE (DELTASONE) 20 MG tablet Take 100 mg (5 tablets) by mouth daily starting on each day of chemotherapy treatment, then 4 days after.  Repeat every 3 weeks. Do not take on the weeks you are not receiving treatment. 150 tablet 0   psyllium (REGULOID) 0.52 g capsule Take by mouth.     riTUXimab in sodium chloride 0.9 % 250 mL Inject into the vein every 21 ( twenty-one) days.     rosuvastatin (CRESTOR) 5 MG tablet Take 1 tablet by mouth at bedtime.     tamsulosin (FLOMAX) 0.4 MG CAPS capsule Take 1 capsule by mouth daily.     trifluridine (VIROPTIC) 1 % ophthalmic solution Place into the left eye.     valACYclovir (VALTREX) 500 MG tablet Take 500 mg by mouth 2 (two) times daily.     VINCRISTINE SULFATE IV Inject into the vein every 21 ( twenty-one) days.     vitamin B-12 (CYANOCOBALAMIN) 1000 MCG tablet Take 1 tablet (1,000 mcg total) by mouth daily. 30 tablet 3   warfarin (COUMADIN) 5 MG tablet Take 1 tablet (5 mg total) by mouth every evening.     acetaminophen (TYLENOL) 325 MG tablet Take 2 tablets (650 mg total) by mouth every 6 (six) hours as needed for headache. (Patient not taking: Reported on 05/03/2021) 30 tablet 1   prochlorperazine (COMPAZINE) 10 MG tablet Take 1 tablet (10 mg total) by mouth every 6 (six) hours as needed (Nausea or  vomiting). (Patient not taking: Reported on 05/03/2021) 30 tablet 6   No current facility-administered medications for this visit.    ALLERGIES:  Allergies  Allergen Reactions   Adhesive [Tape]     Contact dermatitis   Sulfa Antibiotics     UNKNOWN Childhood reaction    PHYSICAL EXAM:  Performance status (ECOG): 0 - Asymptomatic  Vitals:   05/03/21 0808  BP: 101/62  Pulse: (!) 59  Resp: 18  Temp: (!) 96.2 F (35.7 C)  SpO2: 98%   Wt Readings from Last 3 Encounters:  05/03/21 233 lb (105.7 kg)  04/20/21 223 lb 5.2 oz (101.3 kg)  04/12/21 227 lb 8.2 oz (103.2 kg)   Physical Exam Vitals reviewed.  Constitutional:      Appearance: Normal appearance. He is obese.  Cardiovascular:     Rate and Rhythm: Normal rate and regular rhythm.     Pulses: Normal pulses.     Heart sounds: Normal heart sounds.  Pulmonary:     Effort: Pulmonary effort is normal.     Breath sounds: Normal breath sounds.  Musculoskeletal:     Right lower leg: No edema.     Left lower leg: No edema.  Neurological:     General: No focal deficit present.     Mental Status: He is alert and oriented to person, place, and time.  Psychiatric:        Mood and Affect: Mood normal.        Behavior: Behavior normal.    LABORATORY DATA:  I have reviewed the labs as listed.  CBC Latest Ref Rng & Units 05/03/2021 04/20/2021 04/12/2021  WBC 4.0 - 10.5 K/uL 6.7 0.8(LL) 5.1  Hemoglobin 13.0 - 17.0 g/dL 10.5(L) 10.7(L) 10.9(L)  Hematocrit 39.0 - 52.0 % 33.1(L) 33.3(L) 34.4(L)  Platelets 150 - 400 K/uL 295 123(L) 130(L)   CMP Latest Ref Rng & Units 04/20/2021 04/12/2021 04/07/2021  Glucose 70 - 99 mg/dL 101(H) 139(H) 126(H)  BUN 8 - 23 mg/dL _0 Creatinine 0.61 - 1.24 mg/dL 0.61 0.65 0.75  Sodium 135 - 145 mmol/L 134(L) 133(L) 128(L)  Potassium 3.5 - 5.1 mmol/L 4.1 4.1 4.5  Chloride 98 - 111 mmol/L 93(L) 96(L) 93(L)  CO2 22 - 32 mmol/L 33(H) 29 25  Calcium 8.9 - 10.3 mg/dL 9.1 8.5(L) 8.8(L)  Total  Protein 6.5 - 8.1 g/dL 5.9(L) 6.2(L) 5.8(L)  Total Bilirubin 0.3 - 1.2 mg/dL 1.6(H) 1.1 4.1(H)  Alkaline Phos 38 - 126 U/L 81 125 127(H)  AST 15 - 41 U/L 27 39 59(H)  ALT 0 - 44 U/L 40 33 36    DIAGNOSTIC IMAGING:  I have independently reviewed the scans and discussed with the patient. No results found.   ASSESSMENT:  1.  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  High-grade lymphoma panel was negative. - Weight loss of 40 pounds in the last 4 months, stable weight over the last 1 month.  No fevers or night sweats.  - Reviewed PET CT scan from 03/16/2021 which showed hypermetabolic enlarged spleen.  There are hypermetabolic neck lymph nodes.  Diffuse hypermetabolic activity within the marrow space of the spine.  Diffuse groundglass density within the lungs with moderate activity.    2.  Social/family history: - He lives at home by himself.  His son is present today with him. - He is independent of ADLs and IADLs.  Son lives within 30 minutes.  He was never smoker. - He has work-related exposure to dyes.  No pesticide exposure. - Brother died of bone cancer.  Another brother also died of bone cancer.   PLAN:  1.  Diffuse large B-cell lymphoma, stage IVb: - Cycle 1 of R-CHOP on 04/13/2021. - He has tolerated chemotherapy very well.  Did not experience any major GI side effects.  No worsening of neuropathy.  No signs of PND or orthopnea. - Reviewed labs from today which showed white count 6.7 and normal platelet count.  Hemoglobin is 10.5.  LFTs are normal. - He had improvement in cytopenias with 1 cycle of chemotherapy. - He will proceed with cycle 2 chemotherapy today.  RTC 3 weeks for follow-up.    2.  Hyperuricemia: - Continue allopurinol 300 mg daily.  Uric acid today is 4.6.    3.  Peripheral neuropathy: -  Tingling and numbness in the bottom of the feet and toes has been stable.  Right hand fingers are also stable. - Continue gabapentin.  4.  High risk drug monitoring: - Echo on 01/09/2021 with EF 55-60%.  5.  Severe constipation: - Continue Colace and lactulose which is helping.   Orders placed this encounter:  No orders of the defined types were placed in this encounter.    Derek Jack, MD [place] [phone]   I, Thana Ates, am acting as a scribe for Dr. Derek Jack.  I, Derek Jack MD, have reviewed the above documentation for accuracy and completeness, and I agree with the above.

## 2021-05-03 ENCOUNTER — Other Ambulatory Visit: Payer: Self-pay

## 2021-05-03 ENCOUNTER — Inpatient Hospital Stay (HOSPITAL_BASED_OUTPATIENT_CLINIC_OR_DEPARTMENT_OTHER): Payer: Medicare Other | Admitting: Hematology

## 2021-05-03 ENCOUNTER — Other Ambulatory Visit (HOSPITAL_COMMUNITY): Payer: Medicare Other

## 2021-05-03 ENCOUNTER — Inpatient Hospital Stay (HOSPITAL_COMMUNITY): Payer: Medicare Other

## 2021-05-03 VITALS — BP 103/62 | HR 47 | Temp 97.1°F | Resp 18

## 2021-05-03 VITALS — BP 101/62 | HR 59 | Temp 96.2°F | Resp 18 | Ht 68.5 in | Wt 233.0 lb

## 2021-05-03 DIAGNOSIS — C8590 Non-Hodgkin lymphoma, unspecified, unspecified site: Secondary | ICD-10-CM

## 2021-05-03 DIAGNOSIS — Z5112 Encounter for antineoplastic immunotherapy: Secondary | ICD-10-CM | POA: Diagnosis not present

## 2021-05-03 DIAGNOSIS — C8338 Diffuse large B-cell lymphoma, lymph nodes of multiple sites: Secondary | ICD-10-CM | POA: Diagnosis not present

## 2021-05-03 LAB — CBC WITH DIFFERENTIAL/PLATELET
Abs Immature Granulocytes: 0.02 10*3/uL (ref 0.00–0.07)
Basophils Absolute: 0.1 10*3/uL (ref 0.0–0.1)
Basophils Relative: 1 %
Eosinophils Absolute: 0 10*3/uL (ref 0.0–0.5)
Eosinophils Relative: 0 %
HCT: 33.1 % — ABNORMAL LOW (ref 39.0–52.0)
Hemoglobin: 10.5 g/dL — ABNORMAL LOW (ref 13.0–17.0)
Immature Granulocytes: 0 %
Lymphocytes Relative: 10 %
Lymphs Abs: 0.7 10*3/uL (ref 0.7–4.0)
MCH: 32.7 pg (ref 26.0–34.0)
MCHC: 31.7 g/dL (ref 30.0–36.0)
MCV: 103.1 fL — ABNORMAL HIGH (ref 80.0–100.0)
Monocytes Absolute: 0.1 10*3/uL (ref 0.1–1.0)
Monocytes Relative: 2 %
Neutro Abs: 5.9 10*3/uL (ref 1.7–7.7)
Neutrophils Relative %: 87 %
Platelets: 295 10*3/uL (ref 150–400)
RBC: 3.21 MIL/uL — ABNORMAL LOW (ref 4.22–5.81)
RDW: 20.8 % — ABNORMAL HIGH (ref 11.5–15.5)
WBC: 6.7 10*3/uL (ref 4.0–10.5)
nRBC: 0 % (ref 0.0–0.2)

## 2021-05-03 LAB — COMPREHENSIVE METABOLIC PANEL
ALT: 14 U/L (ref 0–44)
AST: 20 U/L (ref 15–41)
Albumin: 3.1 g/dL — ABNORMAL LOW (ref 3.5–5.0)
Alkaline Phosphatase: 78 U/L (ref 38–126)
Anion gap: 7 (ref 5–15)
BUN: 11 mg/dL (ref 8–23)
CO2: 27 mmol/L (ref 22–32)
Calcium: 8.4 mg/dL — ABNORMAL LOW (ref 8.9–10.3)
Chloride: 99 mmol/L (ref 98–111)
Creatinine, Ser: 0.7 mg/dL (ref 0.61–1.24)
GFR, Estimated: >60 mL/min (ref >60)
Glucose, Bld: 201 mg/dL — ABNORMAL HIGH (ref 70–99)
Potassium: 3.4 mmol/L — ABNORMAL LOW (ref 3.5–5.1)
Sodium: 133 mmol/L — ABNORMAL LOW (ref 135–145)
Total Bilirubin: 1 mg/dL (ref 0.3–1.2)
Total Protein: 5.8 g/dL — ABNORMAL LOW (ref 6.5–8.1)

## 2021-05-03 LAB — URIC ACID: Uric Acid, Serum: 4.6 mg/dL (ref 3.7–8.6)

## 2021-05-03 LAB — MAGNESIUM: Magnesium: 1.9 mg/dL (ref 1.7–2.4)

## 2021-05-03 LAB — LACTATE DEHYDROGENASE: LDH: 126 U/L (ref 98–192)

## 2021-05-03 LAB — PHOSPHORUS: Phosphorus: 2.7 mg/dL (ref 2.5–4.6)

## 2021-05-03 MED ORDER — DIPHENHYDRAMINE HCL 25 MG PO CAPS
50.0000 mg | ORAL_CAPSULE | Freq: Once | ORAL | Status: AC
  Administered 2021-05-03: 10:00:00 50 mg via ORAL
  Filled 2021-05-03: qty 2

## 2021-05-03 MED ORDER — SODIUM CHLORIDE 0.9 % IV SOLN
375.0000 mg/m2 | Freq: Once | INTRAVENOUS | Status: AC
  Administered 2021-05-03: 12:00:00 900 mg via INTRAVENOUS
  Filled 2021-05-03: qty 50

## 2021-05-03 MED ORDER — VINCRISTINE SULFATE CHEMO INJECTION 1 MG/ML
2.0000 mg | Freq: Once | INTRAVENOUS | Status: AC
  Administered 2021-05-03: 16:00:00 2 mg via INTRAVENOUS
  Filled 2021-05-03: qty 2

## 2021-05-03 MED ORDER — ACETAMINOPHEN 325 MG PO TABS
650.0000 mg | ORAL_TABLET | Freq: Once | ORAL | Status: AC
  Administered 2021-05-03: 10:00:00 650 mg via ORAL
  Filled 2021-05-03: qty 2

## 2021-05-03 MED ORDER — FAMOTIDINE IN NACL 20-0.9 MG/50ML-% IV SOLN: 20.0000 mg | Freq: Once | INTRAVENOUS | Status: DC

## 2021-05-03 MED ORDER — SODIUM CHLORIDE 0.9 % IV SOLN
10.0000 mg | Freq: Once | INTRAVENOUS | Status: AC
  Administered 2021-05-03: 11:00:00 10 mg via INTRAVENOUS
  Filled 2021-05-03: qty 1

## 2021-05-03 MED ORDER — SODIUM CHLORIDE 0.9 % IV SOLN
750.0000 mg/m2 | Freq: Once | INTRAVENOUS | Status: AC
  Administered 2021-05-03: 16:00:00 1740 mg via INTRAVENOUS
  Filled 2021-05-03: qty 87

## 2021-05-03 MED ORDER — DOXORUBICIN HCL CHEMO IV INJECTION 2 MG/ML
50.0000 mg/m2 | Freq: Once | INTRAVENOUS | Status: AC
  Administered 2021-05-03: 16:00:00 116 mg via INTRAVENOUS
  Filled 2021-05-03: qty 58

## 2021-05-03 MED ORDER — PALONOSETRON HCL INJECTION 0.25 MG/5ML
0.2500 mg | Freq: Once | INTRAVENOUS | Status: AC
  Administered 2021-05-03: 10:00:00 0.25 mg via INTRAVENOUS
  Filled 2021-05-03: qty 5

## 2021-05-03 MED ORDER — SODIUM CHLORIDE 0.9 % IV SOLN: Freq: Once | INTRAVENOUS | Status: AC

## 2021-05-03 MED ORDER — HEPARIN SOD (PORK) LOCK FLUSH 100 UNIT/ML IV SOLN
500.0000 [IU] | Freq: Once | INTRAVENOUS | Status: AC | PRN
  Administered 2021-05-03: 17:00:00 500 [IU]

## 2021-05-03 MED ORDER — SODIUM CHLORIDE 0.9 % IV SOLN
150.0000 mg | Freq: Once | INTRAVENOUS | Status: AC
  Administered 2021-05-03: 11:00:00 150 mg via INTRAVENOUS
  Filled 2021-05-03: qty 150

## 2021-05-03 MED ORDER — FAMOTIDINE IN NACL 20-0.9 MG/50ML-% IV SOLN
20.0000 mg | Freq: Once | INTRAVENOUS | Status: AC
  Administered 2021-05-03: 11:00:00 20 mg via INTRAVENOUS
  Filled 2021-05-03: qty 50

## 2021-05-03 MED ORDER — SODIUM CHLORIDE 0.9% FLUSH
10.0000 mL | INTRAVENOUS | Status: DC | PRN
  Administered 2021-05-03: 17:00:00 10 mL

## 2021-05-03 NOTE — Progress Notes (Signed)
Patient presents today for chemotherapy infusion.  Patient is in satisfactory condition with no complaints voiced.  Vital signs are stable.  We will proceed with treatment per MD orders.   Patient tolerated treatment well with no complaints voiced.  Patient left ambulatory in stable condition.  Vital signs stable at discharge.  Follow up as scheduled.    

## 2021-05-03 NOTE — Patient Instructions (Signed)
[facility]  Discharge Instructions: Thank you for choosing [facility] to provide your oncology and hematology care.  If you have a lab appointment with the Cancer Center, please come in thru the Main Entrance and check in at the main information desk.  Wear comfortable clothing and clothing appropriate for easy access to any Portacath or PICC line.   We strive to give you quality time with your provider. You may need to reschedule your appointment if you arrive late (15 or more minutes).  Arriving late affects you and other patients whose appointments are after yours.  Also, if you miss three or more appointments without notifying the office, you may be dismissed from the clinic at the provider's discretion.      For prescription refill requests, have your pharmacy contact our office and allow 72 hours for refills to be completed.        To help prevent nausea and vomiting after your treatment, we encourage you to take your nausea medication as directed.  BELOW ARE SYMPTOMS THAT SHOULD BE REPORTED IMMEDIATELY: *FEVER GREATER THAN 100.4 F (38 C) OR HIGHER *CHILLS OR SWEATING *NAUSEA AND VOMITING THAT IS NOT CONTROLLED WITH YOUR NAUSEA MEDICATION *UNUSUAL SHORTNESS OF BREATH *UNUSUAL BRUISING OR BLEEDING *URINARY PROBLEMS (pain or burning when urinating, or frequent urination) *BOWEL PROBLEMS (unusual diarrhea, constipation, pain near the anus) TENDERNESS IN MOUTH AND THROAT WITH OR WITHOUT PRESENCE OF ULCERS (sore throat, sores in mouth, or a toothache) UNUSUAL RASH, SWELLING OR PAIN  UNUSUAL VAGINAL DISCHARGE OR ITCHING   Items with * indicate a potential emergency and should be followed up as soon as possible or go to the Emergency Department if any problems should occur.  Please show the CHEMOTHERAPY ALERT CARD or IMMUNOTHERAPY ALERT CARD at check-in to the Emergency Department and triage nurse.  Should you have questions after your visit or need to cancel  or reschedule your appointment, please contact  CANCER CENTER [phone]  and follow the prompts.  Office hours are 8:00 a.m. to 4:30 p.m. Monday - Friday. Please note that voicemails left after 4:00 p.m. may not be returned until the following business day.  We are closed weekends and major holidays. You have access to a nurse at all times for urgent questions. Please call the main number to the clinic [phone] and follow the prompts.  For any non-urgent questions, you may also contact your provider using MyChart. We now offer e-Visits for anyone 18 and older to request care online for non-urgent symptoms. For details visit [email or web address].   Also download the MyChart app! Go to the app store, search "MyChart", open the app, select [place], and log in with your MyChart username and password.  Due to Covid, a mask is required upon entering the hospital/clinic. If you do not have a mask, one will be given to you upon arrival. For doctor visits, patients may have 1 support person aged 18 or older with them. For treatment visits, patients cannot have anyone with them due to current Covid guidelines and our immunocompromised population.  

## 2021-05-03 NOTE — Patient Instructions (Signed)
[facility] ?Discharge Instructions ? ? ?You were seen and examined today by Dr. Katragadda. ? ?He reviewed your lab work which is normal/stable. ? ?We will proceed with your treatment today. ? ?Return as scheduled in 3 weeks.  ? ? ?Thank you for choosing [facility] to provide your oncology and hematology care.  To afford each patient quality time with our provider, please arrive at least 15 minutes before your scheduled appointment time.  ? ?If you have a lab appointment with the Cancer Center please come in thru the Main Entrance and check in at the main information desk. ? ?You need to re-schedule your appointment should you arrive 10 or more minutes late.  We strive to give you quality time with our providers, and arriving late affects you and other patients whose appointments are after yours.  Also, if you no show three or more times for appointments you may be dismissed from the clinic at the providers discretion.     ?Again, thank you for choosing [facility].  Our hope is that these requests will decrease the amount of time that you wait before being seen by our physicians.       ?_____________________________________________________________ ? ?Should you have questions after your visit to [facility], please contact our office at [phone] and follow the prompts.  Our office hours are 8:00 a.m. and 4:30 p.m. Monday - Friday.  Please note that voicemails left after 4:00 p.m. may not be returned until the following business day.  We are closed weekends and major holidays.  You do have access to a nurse 24-7, just call the main number to the clinic [phone] and do not press any options, hold on the line and a nurse will answer the phone.   ? ?For prescription refill requests, have your pharmacy contact our office and allow 72 hours.   ? ?Due to Covid, you will need to wear a mask upon entering the  hospital. If you do not have a mask, a mask will be given to you at the Main Entrance upon arrival. For doctor visits, patients may have 1 support person age 18 or older with them. For treatment visits, patients can not have anyone with them due to social distancing guidelines and our immunocompromised population.  ? ?   ?

## 2021-05-05 ENCOUNTER — Inpatient Hospital Stay (HOSPITAL_COMMUNITY): Payer: Medicare Other

## 2021-05-05 ENCOUNTER — Other Ambulatory Visit: Payer: Self-pay

## 2021-05-05 VITALS — BP 125/64 | HR 56 | Temp 97.0°F | Resp 18

## 2021-05-05 DIAGNOSIS — C8338 Diffuse large B-cell lymphoma, lymph nodes of multiple sites: Secondary | ICD-10-CM

## 2021-05-05 DIAGNOSIS — Z5112 Encounter for antineoplastic immunotherapy: Secondary | ICD-10-CM | POA: Diagnosis not present

## 2021-05-05 MED ORDER — PEGFILGRASTIM-BMEZ 6 MG/0.6ML ~~LOC~~ SOSY
6.0000 mg | PREFILLED_SYRINGE | Freq: Once | SUBCUTANEOUS | Status: AC
  Administered 2021-05-05: 12:00:00 6 mg via SUBCUTANEOUS
  Filled 2021-05-05: qty 0.6

## 2021-05-05 NOTE — Patient Instructions (Signed)
[facility]  Discharge Instructions: Thank you for choosing [facility] to provide your oncology and hematology care.  If you have a lab appointment with the Cancer Center, please come in thru the Main Entrance and check in at the main information desk.  Wear comfortable clothing and clothing appropriate for easy access to any Portacath or PICC line.   We strive to give you quality time with your provider. You may need to reschedule your appointment if you arrive late (15 or more minutes).  Arriving late affects you and other patients whose appointments are after yours.  Also, if you miss three or more appointments without notifying the office, you may be dismissed from the clinic at the provider's discretion.      For prescription refill requests, have your pharmacy contact our office and allow 72 hours for refills to be completed.    Today you received the following chemotherapy and/or immunotherapy agents Ziextenzo      To help prevent nausea and vomiting after your treatment, we encourage you to take your nausea medication as directed.  BELOW ARE SYMPTOMS THAT SHOULD BE REPORTED IMMEDIATELY: *FEVER GREATER THAN 100.4 F (38 C) OR HIGHER *CHILLS OR SWEATING *NAUSEA AND VOMITING THAT IS NOT CONTROLLED WITH YOUR NAUSEA MEDICATION *UNUSUAL SHORTNESS OF BREATH *UNUSUAL BRUISING OR BLEEDING *URINARY PROBLEMS (pain or burning when urinating, or frequent urination) *BOWEL PROBLEMS (unusual diarrhea, constipation, pain near the anus) TENDERNESS IN MOUTH AND THROAT WITH OR WITHOUT PRESENCE OF ULCERS (sore throat, sores in mouth, or a toothache) UNUSUAL RASH, SWELLING OR PAIN  UNUSUAL VAGINAL DISCHARGE OR ITCHING   Items with * indicate a potential emergency and should be followed up as soon as possible or go to the Emergency Department if any problems should occur.  Please show the CHEMOTHERAPY ALERT CARD or IMMUNOTHERAPY ALERT CARD at check-in to the Emergency  Department and triage nurse.  Should you have questions after your visit or need to cancel or reschedule your appointment, please contact [facility] [phone]  and follow the prompts.  Office hours are 8:00 a.m. to 4:30 p.m. Monday - Friday. Please note that voicemails left after 4:00 p.m. may not be returned until the following business day.  We are closed weekends and major holidays. You have access to a nurse at all times for urgent questions. Please call the main number to the clinic [phone] and follow the prompts.  For any non-urgent questions, you may also contact your provider using MyChart. We now offer e-Visits for anyone 18 and older to request care online for non-urgent symptoms. For details visit [email or web address].   Also download the MyChart app! Go to the app store, search "MyChart", open the app, select , and log in with your MyChart username and password.  Due to Covid, a mask is required upon entering the hospital/clinic. If you do not have a mask, one will be given to you upon arrival. For doctor visits, patients may have 1 support person aged 18 or older with them. For treatment visits, patients cannot have anyone with them due to current Covid guidelines and our immunocompromised population.  

## 2021-05-05 NOTE — Progress Notes (Signed)
Roy [name] presents today for Ziextenzo injection per the provider's orders.  Stable during administration without incident; injection site WNL; see MAR for injection details.  Patient tolerated procedure well and without incident.  No questions or complaints noted at this time. Discharge from clinic via wheelchair in stable condition.  Alert and oriented X 3.  Follow up with [facility] as scheduled.

## 2021-05-12 ENCOUNTER — Emergency Department (HOSPITAL_COMMUNITY): Payer: Medicare Other

## 2021-05-12 ENCOUNTER — Other Ambulatory Visit: Payer: Self-pay

## 2021-05-12 ENCOUNTER — Encounter (HOSPITAL_COMMUNITY): Payer: Self-pay

## 2021-05-12 ENCOUNTER — Emergency Department (HOSPITAL_COMMUNITY)
Admission: EM | Admit: 2021-05-12 | Discharge: 2021-05-12 | Disposition: A | Discharge status: Home / Self Care | Payer: Medicare Other | Attending: Emergency Medicine | Admitting: Emergency Medicine

## 2021-05-12 DIAGNOSIS — Z20822 Contact with and (suspected) exposure to covid-19: Secondary | ICD-10-CM | POA: Insufficient documentation

## 2021-05-12 DIAGNOSIS — K2970 Gastritis, unspecified, without bleeding: Secondary | ICD-10-CM | POA: Diagnosis not present

## 2021-05-12 DIAGNOSIS — M109 Gout, unspecified: Secondary | ICD-10-CM | POA: Insufficient documentation

## 2021-05-12 DIAGNOSIS — R079 Chest pain, unspecified: Secondary | ICD-10-CM | POA: Diagnosis present

## 2021-05-12 DIAGNOSIS — Z7901 Long term (current) use of anticoagulants: Secondary | ICD-10-CM | POA: Diagnosis not present

## 2021-05-12 DIAGNOSIS — Z8571 Personal history of Hodgkin lymphoma: Secondary | ICD-10-CM | POA: Insufficient documentation

## 2021-05-12 LAB — CBC WITH DIFFERENTIAL/PLATELET
Basophils Absolute: 0 10*3/uL (ref 0.0–0.1)
Basophils Relative: 1 %
Eosinophils Absolute: 0 10*3/uL (ref 0.0–0.5)
Eosinophils Relative: 0 %
HCT: 31.9 % — ABNORMAL LOW (ref 39.0–52.0)
Hemoglobin: 10.3 g/dL — ABNORMAL LOW (ref 13.0–17.0)
Lymphocytes Relative: 75 %
Lymphs Abs: 1.4 10*3/uL (ref 0.7–4.0)
MCH: 32.3 pg (ref 26.0–34.0)
MCHC: 32.3 g/dL (ref 30.0–36.0)
MCV: 100 fL (ref 80.0–100.0)
Metamyelocytes Relative: 2 %
Monocytes Absolute: 0.3 10*3/uL (ref 0.1–1.0)
Monocytes Relative: 14 %
Neutro Abs: 0.2 10*3/uL — CL (ref 1.7–7.7)
Neutrophils Relative %: 8 %
Platelets: 128 10*3/uL — ABNORMAL LOW (ref 150–400)
RBC: 3.19 MIL/uL — ABNORMAL LOW (ref 4.22–5.81)
RDW: 18.8 % — ABNORMAL HIGH (ref 11.5–15.5)
WBC: 1.9 10*3/uL — ABNORMAL LOW (ref 4.0–10.5)
nRBC: 3.7 % — ABNORMAL HIGH (ref 0.0–0.2)

## 2021-05-12 LAB — BASIC METABOLIC PANEL
Anion gap: 11 (ref 5–15)
BUN: 10 mg/dL (ref 8–23)
CO2: 27 mmol/L (ref 22–32)
Calcium: 9.1 mg/dL (ref 8.9–10.3)
Chloride: 96 mmol/L — ABNORMAL LOW (ref 98–111)
Creatinine, Ser: 0.76 mg/dL (ref 0.61–1.24)
GFR, Estimated: >60 mL/min (ref >60)
Glucose, Bld: 84 mg/dL (ref 70–99)
Potassium: 3.8 mmol/L (ref 3.5–5.1)
Sodium: 134 mmol/L — ABNORMAL LOW (ref 135–145)

## 2021-05-12 LAB — RESP PANEL BY RT-PCR (FLU A&B, COVID) ARPGX2
Influenza A by PCR: NEGATIVE
Influenza B by PCR: NEGATIVE
SARS Coronavirus 2 by RT PCR: NEGATIVE

## 2021-05-12 LAB — TROPONIN I (HIGH SENSITIVITY)
Troponin I (High Sensitivity): 12 ng/L (ref <18)
Troponin I (High Sensitivity): 12 ng/L (ref <18)

## 2021-05-12 MED ORDER — LIDOCAINE VISCOUS HCL 2 % MT SOLN
15.0000 mL | Freq: Once | OROMUCOSAL | Status: AC
  Administered 2021-05-12: 15 mL via ORAL
  Filled 2021-05-12: qty 15

## 2021-05-12 MED ORDER — BENZONATATE 100 MG PO CAPS: 100.0000 mg | ORAL_CAPSULE | Freq: Three times a day (TID) | ORAL | 0 refills | Status: AC | PRN

## 2021-05-12 MED ORDER — ALUM & MAG HYDROXIDE-SIMETH 200-200-20 MG/5ML PO SUSP
30.0000 mL | Freq: Once | ORAL | Status: AC
  Administered 2021-05-12: 30 mL via ORAL
  Filled 2021-05-12: qty 30

## 2021-05-12 NOTE — ED Triage Notes (Signed)
Pt presents to ED with complaints of throbbing chest pain started at 0200. Pt states he started coughing really bad last night and thinks that started it. Pt denies SOB.

## 2021-05-12 NOTE — ED Notes (Signed)
Date and time results received: 05/12/21 9:51 AM   Test: Absolute Neutrophil count Critical Value: 0.2  Name of Provider Notified: Dr. Langston Masker  Orders Received? Or Actions Taken?: see orders

## 2021-05-12 NOTE — ED Provider Notes (Signed)
[facility] EMERGENCY DEPARTMENT Provider Note   CSN: [id number] Arrival date & time: 05/12/21  0820     History  Chief Complaint  Patient presents with   Chest Pain    Roy [name] is a 71 y.o. male presented emergency department with chest pain  HPI     Home Medications Prior to Admission medications   Medication Sig Start Date End Date Taking? Authorizing Provider  benzonatate (TESSALON) 100 MG capsule Take 1 capsule (100 mg total) by mouth 3 (three) times daily as needed for up to 21 days for cough. 05/12/21 06/02/21 Yes Antania Hoefling, Carola Rhine, MD  acetaminophen (TYLENOL) 325 MG tablet Take 2 tablets (650 mg total) by mouth every 6 (six) hours as needed for headache. 01/13/21   Roxan Hockey, MD  allopurinol (ZYLOPRIM) 300 MG tablet Take 1 tablet (300 mg total) by mouth daily. 03/22/21   Derek Jack, MD  Calcium Carbonate-Vitamin D (CALCIUM-VITAMIN D) 600-125 MG-UNIT TABS Take by mouth.    [provider]  Cholecalciferol (VITAMIN D3) 25 MCG (1000 UT) CAPS Take by mouth daily.    [provider]  CYCLOPHOSPHAMIDE IV Inject into the vein every 21 ( twenty-one) days. 04/12/21   [provider]  docusate sodium (COLACE) 100 MG capsule Take 2 capsules (200 mg total) by mouth 2 (two) times daily. 04/20/21 05/20/21  Harriett Rush, PA-C  DOXORUBICIN HCL IV Inject into the vein every 21 ( twenty-one) days. 04/12/21   [provider]  erythromycin ophthalmic ointment SMARTSIG:1 In Eye(s) Every Night 04/14/21   [provider]  ferrous sulfate 325 (65 FE) MG EC tablet Take 1 tablet (325 mg total) by mouth daily with breakfast. 02/28/21   Tarri Abernethy M, PA-C  gabapentin (NEURONTIN) 100 MG capsule Take 1 capsule (100 mg total) by mouth 2 (two) times daily. 03/22/21   Derek Jack, MD  Lactulose 20 GM/30ML SOLN Take 30 ml by mouth every 3 hours until a bowel movement occurs.  Then take 30 ml by mouth at bedtime. 03/22/21    Derek Jack, MD  levothyroxine (SYNTHROID) 50 MCG tablet Take 1 tablet (50 mcg total) by mouth daily before breakfast. 03/02/21   Nida, Marella Chimes, MD  lidocaine (XYLOCAINE) 2 % jelly Apply to affected area daily prn 01/16/21 01/16/22  [provider]  lidocaine-prilocaine (EMLA) cream Apply a small amount to port a cath site and cover with plastic wrap 1 hour prior to chemotherapy appointments 04/07/21   Derek Jack, MD  magnesium oxide (MAG-OX) 400 (240 Mg) MG tablet Take 1 tablet (400 mg total) by mouth 2 (two) times daily. 10/24/20   [name], MD  midodrine (PROAMATINE) 5 MG tablet Take 1 tablet (5 mg total) by mouth 3 (three) times daily with meals. 03/29/21   Cassandria Anger, MD  omeprazole (PRILOSEC) 10 MG capsule Take 1 capsule by mouth daily. 11/15/20   [provider]  potassium chloride (KLOR-CON) 10 MEQ tablet Take 10 mEq by mouth 2 (two) times daily. 11/04/20   [provider]  prednisoLONE acetate (PRED FORTE) 1 % ophthalmic suspension Place into the left eye. 04/14/21   [provider]  predniSONE (DELTASONE) 10 MG tablet Take 1 tablet (10 mg total) by mouth daily with breakfast. 03/02/21   Nida, Marella Chimes, MD  predniSONE (DELTASONE) 20 MG tablet Take 100 mg (5 tablets) by mouth daily starting on each day of chemotherapy treatment, then 4 days after.  Repeat every 3 weeks. Do not take on  the weeks you are not receiving treatment. 04/07/21   Derek Jack, MD  prochlorperazine (COMPAZINE) 10 MG tablet Take 1 tablet (10 mg total) by mouth every 6 (six) hours as needed (Nausea or vomiting). 04/07/21   Derek Jack, MD  psyllium (REGULOID) 0.52 g capsule Take by mouth. 01/16/21 01/16/22  [provider]  riTUXimab in sodium chloride 0.9 % 250 mL Inject into the vein every 21 ( twenty-one) days. 04/12/21   [provider]  rosuvastatin (CRESTOR) 5 MG tablet Take 1 tablet by mouth at bedtime. 08/23/20    [provider]  tamsulosin (FLOMAX) 0.4 MG CAPS capsule Take 1 capsule by mouth daily. 02/28/21   [provider]  trifluridine (VIROPTIC) 1 % ophthalmic solution Place into the left eye. 04/14/21   [provider]  valACYclovir (VALTREX) 500 MG tablet Take 500 mg by mouth 2 (two) times daily. 04/14/21   [provider]  VINCRISTINE SULFATE IV Inject into the vein every 21 ( twenty-one) days. 04/12/21   [provider]  vitamin B-12 (CYANOCOBALAMIN) 1000 MCG tablet Take 1 tablet (1,000 mcg total) by mouth daily. 02/28/21   Harriett Rush, PA-C  warfarin (COUMADIN) 5 MG tablet Take 1 tablet (5 mg total) by mouth every evening. 01/15/21   Roxan Hockey, MD      Allergies    Adhesive [tape] and Sulfa antibiotics    Review of Systems   Review of Systems  Physical Exam Updated Vital Signs BP 107/68    Pulse 88    Temp 98.4 F (36.9 C) (Oral)    Resp (!) 21    Ht 6' (1.829 m)    Wt 105.2 kg    SpO2 95%    BMI 31.46 kg/m  Physical Exam  ED Results / Procedures / Treatments   Labs (all labs ordered are listed, but only abnormal results are displayed) Labs Reviewed  BASIC METABOLIC PANEL - Abnormal; Notable for the following components:      Result Value   Sodium 134 (*)    Chloride 96 (*)    All other components within normal limits  CBC WITH DIFFERENTIAL/PLATELET - Abnormal; Notable for the following components:   WBC 1.9 (*)    RBC 3.19 (*)    Hemoglobin 10.3 (*)    HCT 31.9 (*)    RDW 18.8 (*)    Platelets 128 (*)    nRBC 3.7 (*)    Neutro Abs 0.2 (*)    All other components within normal limits  RESP PANEL BY RT-PCR (FLU A&B, COVID) ARPGX2  TROPONIN I (HIGH SENSITIVITY)  TROPONIN I (HIGH SENSITIVITY)    EKG None  Radiology DG Chest Portable 1 View  Result Date: 05/12/2021 CLINICAL DATA:  Cough, chest pain EXAM: PORTABLE CHEST 1 VIEW COMPARISON:  01/08/2021 FINDINGS: Transverse diameter of heart is in the upper limits  normal. Thoracic aorta is tortuous and ectatic. There are no signs of pulmonary edema or new focal infiltrates. There is no pleural effusion or pneumothorax. Tip of right IJ chest port is seen in the superior vena cava. IMPRESSION: No active disease. Electronically Signed   By: Elmer Picker M.D.   On: 05/12/2021 09:05    Procedures Procedures    Medications Ordered in ED Medications  alum & mag hydroxide-simeth (MAALOX/MYLANTA) 200-200-20 MG/5ML suspension 30 mL (30 mLs Oral Given 05/12/21 0938)    And  lidocaine (XYLOCAINE) 2 % viscous mouth solution 15 mL (15 mLs Oral Given 05/12/21 0938)  ED Course/ Medical Decision Making/ A&P Clinical Course as of 05/12/21 1054  Fri May 12, 2021  0935 EKG per my interpretation shows a sinus rhythm with chronic right bundle branch block pattern which is largely unchanged from prior tracing, Sept 2022.  No acute ischemic findings. [MT]  1051 Patient reporting complete resolution of his chest pain after GI cocktail.  I suspect this was gastritis.  Okay for discharge [MT]    Clinical Course User Index [MT] Wyvonnia Dusky, MD                           Medical Decision Making  This is a 71 year old male with a history of diffuse large B-cell lymphoma, on R-Chop therapy every 3 weeks, presenting to the ED with chest pain.  Patient reports that he has a chronic dry cough but feels that it worsened yesterday evening.  He said during a coughing fit he began to develop substernal pressure and burning sensation in his epigastrium that radiates right up his sternum.  He says it feels similar to the reflux has had in the past.  However it was more intense than normal which prompted his visit to the ER.  Currently he has moderate intensity discomfort.  Nothing makes it better or worse.  He denies any history of MI or coronary disease, does report a history of a chronic cardiac murmur.  He denies any fevers or chills at home.  He does report that he is having  pain in his left large toe with some overlying redness and wonders whether this may be a gout flareup.  On exam his vital signs are within normal limits.  He appears comfortable.  He is nondiaphoretic.  He does have a mild systolic murmur on cardiac exam, which I suspect is chronic.  His lungs are clear to auscultation.  He has a right chest wall line for chemo, which appears nonerythematous and does not appear infected.  His abdominal exam is benign.  He does have some redness and tenderness and swelling involving the left large toe, which is highly consistent with gout.  This patient presents to the Emergency Department with complaint of chest pain. This involves an extensive number of treatment options, and is a complaint that carries with it a high risk of complications and morbidity, given the patient's comorbidity, including chemotherapy and B-cell lymphoma.The differential diagnosis includes ACS vs Pneumothorax vs Reflux/Gastritis vs MSK pain vs Pneumonia vs other.  I felt PE was less likely given that he has no tachycardia, no hypoxia, that his symptoms are more consistent with reflux  I ordered, reviewed, and interpreted labs.  Pertinent results include unremarkable troponin, CBC near baseline levels, BMP at baseline.  I ordered medication GI cocktail with lidocaine for suspected reflux I ordered imaging studies which included x-ray of the I independently visualized and interpreted imaging which showed no focal infiltrate or evidence of pneumothorax and the monitor tracing which showed sinus. I agree with the radiologist interpretation I personally reviewed the patients ECG which showed sinus rhythm with no acute ischemic findings  After the interventions stated above, I reevaluated the patient and found that they were symptomatically improved  Based on the patient's clinical exam, vital signs, risk factors, and ED testing, I felt that the patient's overall risk of life-threatening emergency  such as ACS, PE, sepsis, or infection was low.  At this time, I felt the patient's presentation was most clinically consistent with gastritis  and gout, but explained to the patient that this evaluation was not a definitive diagnostic workup.  I discussed outpatient follow up with primary care provider, and provided specialist office number on the patient's discharge paper if a referral was deemed necessary.  Return precautions were discussed with the patient.  I felt the patient was clinically stable for discharge.  Clinical Course as of 05/12/21 1054  Fri May 12, 2021  0935 EKG per my interpretation shows a sinus rhythm with chronic right bundle branch block pattern which is largely unchanged from prior tracing, Sept 2022.  No acute ischemic findings. [MT]  1051 Patient reporting complete resolution of his chest pain after GI cocktail.  I suspect this was gastritis.  Okay for discharge [MT]    Clinical Course User Index [MT] Aws Shere, Carola Rhine, MD           Final Clinical Impression(s) / ED Diagnoses Final diagnoses:  Gastritis, presence of bleeding unspecified, unspecified chronicity, unspecified gastritis type  Acute gout involving toe of right foot, unspecified cause    Rx / DC Orders ED Discharge Orders          Ordered    benzonatate (TESSALON) 100 MG capsule  3 times daily PRN        05/12/21 1052              Wyvonnia Dusky, MD 05/12/21 1054

## 2021-05-12 NOTE — ED Notes (Signed)
Pts port deaccessed.  Pt alert and oriented, pt educated on follow up care and Dc'ed with instructions.  Pt verbalized understanding of care and follow up.

## 2021-05-15 ENCOUNTER — Telehealth: Payer: Self-pay | Admitting: "Endocrinology

## 2021-05-15 DIAGNOSIS — E2740 Unspecified adrenocortical insufficiency: Secondary | ICD-10-CM

## 2021-05-15 MED ORDER — MIDODRINE HCL 5 MG PO TABS: ORAL_TABLET | ORAL | 0 refills | Status: DC

## 2021-05-15 NOTE — Telephone Encounter (Signed)
Rx sent 

## 2021-05-15 NOTE — Telephone Encounter (Signed)
Pt said he needs a new RX for his Midodrine because he has ran out too soon because on 11/9 phone note Dr Dorris Fetch increased it. Please send to [place]

## 2021-05-24 ENCOUNTER — Inpatient Hospital Stay (HOSPITAL_COMMUNITY): Payer: Medicare Other | Attending: Hematology

## 2021-05-24 ENCOUNTER — Inpatient Hospital Stay (HOSPITAL_COMMUNITY): Payer: Medicare Other

## 2021-05-24 ENCOUNTER — Other Ambulatory Visit: Payer: Self-pay

## 2021-05-24 ENCOUNTER — Inpatient Hospital Stay (HOSPITAL_BASED_OUTPATIENT_CLINIC_OR_DEPARTMENT_OTHER): Payer: Medicare Other | Admitting: Hematology

## 2021-05-24 VITALS — BP 113/64 | HR 50 | Temp 96.8°F | Resp 20

## 2021-05-24 VITALS — BP 121/62 | HR 63 | Temp 96.9°F | Resp 20 | Ht 72.0 in | Wt 229.7 lb

## 2021-05-24 DIAGNOSIS — G629 Polyneuropathy, unspecified: Secondary | ICD-10-CM | POA: Insufficient documentation

## 2021-05-24 DIAGNOSIS — E790 Hyperuricemia without signs of inflammatory arthritis and tophaceous disease: Secondary | ICD-10-CM | POA: Diagnosis not present

## 2021-05-24 DIAGNOSIS — D72819 Decreased white blood cell count, unspecified: Secondary | ICD-10-CM | POA: Diagnosis not present

## 2021-05-24 DIAGNOSIS — Z5112 Encounter for antineoplastic immunotherapy: Secondary | ICD-10-CM | POA: Insufficient documentation

## 2021-05-24 DIAGNOSIS — D696 Thrombocytopenia, unspecified: Secondary | ICD-10-CM | POA: Diagnosis not present

## 2021-05-24 DIAGNOSIS — T451X5D Adverse effect of antineoplastic and immunosuppressive drugs, subsequent encounter: Secondary | ICD-10-CM | POA: Diagnosis not present

## 2021-05-24 DIAGNOSIS — I4891 Unspecified atrial fibrillation: Secondary | ICD-10-CM | POA: Insufficient documentation

## 2021-05-24 DIAGNOSIS — R634 Abnormal weight loss: Secondary | ICD-10-CM | POA: Insufficient documentation

## 2021-05-24 DIAGNOSIS — C8338 Diffuse large B-cell lymphoma, lymph nodes of multiple sites: Secondary | ICD-10-CM

## 2021-05-24 DIAGNOSIS — Z5189 Encounter for other specified aftercare: Secondary | ICD-10-CM | POA: Diagnosis not present

## 2021-05-24 DIAGNOSIS — Z7901 Long term (current) use of anticoagulants: Secondary | ICD-10-CM | POA: Diagnosis not present

## 2021-05-24 DIAGNOSIS — Z7952 Long term (current) use of systemic steroids: Secondary | ICD-10-CM | POA: Diagnosis not present

## 2021-05-24 DIAGNOSIS — Z79899 Other long term (current) drug therapy: Secondary | ICD-10-CM | POA: Insufficient documentation

## 2021-05-24 DIAGNOSIS — C8330 Diffuse large B-cell lymphoma, unspecified site: Secondary | ICD-10-CM | POA: Diagnosis present

## 2021-05-24 DIAGNOSIS — D701 Agranulocytosis secondary to cancer chemotherapy: Secondary | ICD-10-CM | POA: Insufficient documentation

## 2021-05-24 DIAGNOSIS — K5900 Constipation, unspecified: Secondary | ICD-10-CM | POA: Diagnosis not present

## 2021-05-24 DIAGNOSIS — Z5111 Encounter for antineoplastic chemotherapy: Secondary | ICD-10-CM | POA: Diagnosis not present

## 2021-05-24 DIAGNOSIS — I10 Essential (primary) hypertension: Secondary | ICD-10-CM | POA: Diagnosis not present

## 2021-05-24 LAB — CBC WITH DIFFERENTIAL/PLATELET
Abs Immature Granulocytes: 0.06 10*3/uL (ref 0.00–0.07)
Basophils Absolute: 0.1 10*3/uL (ref 0.0–0.1)
Basophils Relative: 1 %
Eosinophils Absolute: 0 10*3/uL (ref 0.0–0.5)
Eosinophils Relative: 0 %
HCT: 36.8 % — ABNORMAL LOW (ref 39.0–52.0)
Hemoglobin: 11.9 g/dL — ABNORMAL LOW (ref 13.0–17.0)
Immature Granulocytes: 1 %
Lymphocytes Relative: 12 %
Lymphs Abs: 1.3 10*3/uL (ref 0.7–4.0)
MCH: 33.2 pg (ref 26.0–34.0)
MCHC: 32.3 g/dL (ref 30.0–36.0)
MCV: 102.8 fL — ABNORMAL HIGH (ref 80.0–100.0)
Monocytes Absolute: 0.3 10*3/uL (ref 0.1–1.0)
Monocytes Relative: 3 %
Neutro Abs: 9.1 10*3/uL — ABNORMAL HIGH (ref 1.7–7.7)
Neutrophils Relative %: 83 %
Platelets: 379 10*3/uL (ref 150–400)
RBC: 3.58 MIL/uL — ABNORMAL LOW (ref 4.22–5.81)
RDW: 19.7 % — ABNORMAL HIGH (ref 11.5–15.5)
WBC: 10.9 10*3/uL — ABNORMAL HIGH (ref 4.0–10.5)
nRBC: 0.5 % — ABNORMAL HIGH (ref 0.0–0.2)

## 2021-05-24 LAB — COMPREHENSIVE METABOLIC PANEL
ALT: 18 U/L (ref 0–44)
AST: 25 U/L (ref 15–41)
Albumin: 3.7 g/dL (ref 3.5–5.0)
Alkaline Phosphatase: 66 U/L (ref 38–126)
Anion gap: 9 (ref 5–15)
BUN: 21 mg/dL (ref 8–23)
CO2: 30 mmol/L (ref 22–32)
Calcium: 9.1 mg/dL (ref 8.9–10.3)
Chloride: 97 mmol/L — ABNORMAL LOW (ref 98–111)
Creatinine, Ser: 0.72 mg/dL (ref 0.61–1.24)
GFR, Estimated: >60 mL/min (ref >60)
Glucose, Bld: 103 mg/dL — ABNORMAL HIGH (ref 70–99)
Potassium: 4.3 mmol/L (ref 3.5–5.1)
Sodium: 136 mmol/L (ref 135–145)
Total Bilirubin: 0.7 mg/dL (ref 0.3–1.2)
Total Protein: 6.6 g/dL (ref 6.5–8.1)

## 2021-05-24 LAB — LACTATE DEHYDROGENASE: LDH: 132 U/L (ref 98–192)

## 2021-05-24 LAB — MAGNESIUM: Magnesium: 2.1 mg/dL (ref 1.7–2.4)

## 2021-05-24 LAB — PHOSPHORUS: Phosphorus: 3.8 mg/dL (ref 2.5–4.6)

## 2021-05-24 LAB — URIC ACID: Uric Acid, Serum: 4.6 mg/dL (ref 3.7–8.6)

## 2021-05-24 MED ORDER — HEPARIN SOD (PORK) LOCK FLUSH 100 UNIT/ML IV SOLN
500.0000 [IU] | Freq: Once | INTRAVENOUS | Status: AC | PRN
  Administered 2021-05-24: 500 [IU]

## 2021-05-24 MED ORDER — DIPHENHYDRAMINE HCL 25 MG PO CAPS
50.0000 mg | ORAL_CAPSULE | Freq: Once | ORAL | Status: AC
  Administered 2021-05-24: 50 mg via ORAL
  Filled 2021-05-24: qty 2

## 2021-05-24 MED ORDER — FAMOTIDINE IN NACL 20-0.9 MG/50ML-% IV SOLN
20.0000 mg | Freq: Once | INTRAVENOUS | Status: AC
  Administered 2021-05-24: 20 mg via INTRAVENOUS
  Filled 2021-05-24: qty 50

## 2021-05-24 MED ORDER — DOXORUBICIN HCL CHEMO IV INJECTION 2 MG/ML
50.0000 mg/m2 | Freq: Once | INTRAVENOUS | Status: AC
  Administered 2021-05-24: 116 mg via INTRAVENOUS
  Filled 2021-05-24: qty 58

## 2021-05-24 MED ORDER — SODIUM CHLORIDE 0.9 % IV SOLN
150.0000 mg | Freq: Once | INTRAVENOUS | Status: AC
  Administered 2021-05-24: 150 mg via INTRAVENOUS
  Filled 2021-05-24: qty 150

## 2021-05-24 MED ORDER — VINCRISTINE SULFATE CHEMO INJECTION 1 MG/ML
2.0000 mg | Freq: Once | INTRAVENOUS | Status: AC
  Administered 2021-05-24: 2 mg via INTRAVENOUS
  Filled 2021-05-24: qty 2

## 2021-05-24 MED ORDER — PALONOSETRON HCL INJECTION 0.25 MG/5ML
0.2500 mg | Freq: Once | INTRAVENOUS | Status: AC
  Administered 2021-05-24: 0.25 mg via INTRAVENOUS
  Filled 2021-05-24: qty 5

## 2021-05-24 MED ORDER — SODIUM CHLORIDE 0.9 % IV SOLN
375.0000 mg/m2 | Freq: Once | INTRAVENOUS | Status: AC
  Administered 2021-05-24: 900 mg via INTRAVENOUS
  Filled 2021-05-24: qty 50

## 2021-05-24 MED ORDER — SODIUM CHLORIDE 0.9% FLUSH
10.0000 mL | INTRAVENOUS | Status: DC | PRN
  Administered 2021-05-24: 10 mL

## 2021-05-24 MED ORDER — SODIUM CHLORIDE 0.9 % IV SOLN
10.0000 mg | Freq: Once | INTRAVENOUS | Status: AC
  Administered 2021-05-24: 10 mg via INTRAVENOUS
  Filled 2021-05-24: qty 10

## 2021-05-24 MED ORDER — ACETAMINOPHEN 325 MG PO TABS
650.0000 mg | ORAL_TABLET | Freq: Once | ORAL | Status: AC
  Administered 2021-05-24: 650 mg via ORAL
  Filled 2021-05-24: qty 2

## 2021-05-24 MED ORDER — SODIUM CHLORIDE 0.9 % IV SOLN
750.0000 mg/m2 | Freq: Once | INTRAVENOUS | Status: AC
  Administered 2021-05-24: 1740 mg via INTRAVENOUS
  Filled 2021-05-24: qty 87

## 2021-05-24 MED ORDER — SODIUM CHLORIDE 0.9 % IV SOLN: Freq: Once | INTRAVENOUS | Status: AC

## 2021-05-24 NOTE — Progress Notes (Signed)
[facility] [address] [place], [place] [address]   CLINIC:  Medical Oncology/Hematology  PCP:  Rebecka Apley, NP [address] / [place] [address] [phone]   REASON FOR VISIT:  Follow-up for leukopenia and thrombocytopenia   PRIOR THERAPY: none  NGS Results: not done  CURRENT THERAPY: R-CHOP every 3 weeks  BRIEF ONCOLOGIC HISTORY:  Oncology History  DLBCL (diffuse large B cell lymphoma) (HCC)  03/22/2021 Initial Diagnosis   DLBCL (diffuse large B cell lymphoma) (HCC)   03/22/2021 Cancer Staging   Staging form: Hodgkin and Non-Hodgkin Lymphoma, AJCC 8th Edition - Clinical stage from 03/22/2021: Stage IV (Diffuse large B-cell lymphoma) - Signed by Doreatha Massed, MD on 03/22/2021    04/12/2021 -  Chemotherapy   Patient is on Treatment Plan : NON-HODGKINS LYMPHOMA R-CHOP q21d       CANCER STAGING:  Cancer Staging  DLBCL (diffuse large B cell lymphoma) (HCC) Staging form: Hodgkin and Non-Hodgkin Lymphoma, AJCC 8th Edition - Clinical stage from 03/22/2021: Stage IV (Diffuse large B-cell lymphoma) - Signed by Doreatha Massed, MD on 03/22/2021   INTERVAL HISTORY:  Roy [name], a 71 y.o. male, returns for routine follow-up and consideration for next cycle of chemotherapy. Roy [name] was last seen on 05/03/2021.  Due for cycle #3 of R-CHOP today.   Overall, he tells me he has been feeling pretty well. He denies nausea and vomiting. He reports his left foot and ankle swelling and feeling constantly numb which is improved when laying down at night. He denies CP. He reports stable tingling/numbness in his fingertips.    Overall, he feels ready for next cycle of chemo today.    REVIEW OF SYSTEMS:  Review of Systems  Constitutional:  Negative for appetite change and fatigue.  Respiratory:  Positive for cough.   Cardiovascular:  Positive for leg swelling (L ankle and foot). Negative for chest pain.   Gastrointestinal:  Negative for nausea and vomiting.  Psychiatric/Behavioral:  Positive for sleep disturbance.   All other systems reviewed and are negative.  PAST MEDICAL/SURGICAL HISTORY:  Past Medical History:  Diagnosis Date   Atrial fibrillation (HCC)    GERD (gastroesophageal reflux disease)    High cholesterol    Hypertension    Obesity    Past Surgical History:  Procedure Laterality Date   APPENDECTOMY     CATARACT EXTRACTION Bilateral 2022   CHOLECYSTECTOMY     ESOPHAGEAL DILATION     multiple times   IR IMAGING GUIDED PORT INSERTION  03/31/2021   JOINT REPLACEMENT Left    hip    SOCIAL HISTORY:  Social History   Socioeconomic History   Marital status: Widowed    Spouse name: Not on file   Number of children: 3   Years of education: Not on file   Highest education level: Not on file  Occupational History   Occupation: Retired  Tobacco Use   Smoking status: Never   Smokeless tobacco: Never  Vaping Use   Vaping Use: Never used  Substance and Sexual Activity   Alcohol use: No   Drug use: No   Sexual activity: Not Currently  Other Topics Concern   Not on file  Social History Narrative   Not on file   Social Determinants of Health   Financial Resource Strain: Low Risk    Difficulty of Paying Living Expenses: Not hard at all  Food Insecurity: No Food Insecurity   Worried About Running Out of  Food in the Last Year: Never true   [place] in the Last Year: Never true  Transportation Needs: No Transportation Needs   Lack of Transportation (Medical): No   Lack of Transportation (Non-Medical): No  Physical Activity: Sufficiently Active   Days of Exercise per Week: 5 days   Minutes of Exercise per Session: 30 min  Stress: No Stress Concern Present   Feeling of Stress : Not at all  Social Connections: Moderately Isolated   Frequency of Communication with Friends and Family: More than three times a week   Frequency of Social Gatherings with  Friends and Family: Three times a week   Attends Religious Services: 1 to 4 times per year   Active Member of Clubs or Organizations: No   Attends Archivist Meetings: Never   Marital Status: Widowed  Human resources officer Violence: Not At Risk   Fear of Current or Ex-Partner: No   Emotionally Abused: No   Physically Abused: No   Sexually Abused: No    FAMILY HISTORY:  Family History  Problem Relation Age of Onset   Heart failure Father    Heart attack Father        Deceased    Thyroid disease Sister     CURRENT MEDICATIONS:  Current Outpatient Medications  Medication Sig Dispense Refill   acetaminophen (TYLENOL) 325 MG tablet Take 2 tablets (650 mg total) by mouth every 6 (six) hours as needed for headache. 30 tablet 1   allopurinol (ZYLOPRIM) 300 MG tablet Take 1 tablet (300 mg total) by mouth daily. 30 tablet 3   benzonatate (TESSALON) 100 MG capsule Take 1 capsule (100 mg total) by mouth 3 (three) times daily as needed for up to 21 days for cough. 21 capsule 0   Calcium Carbonate-Vitamin D (CALCIUM-VITAMIN D) 600-125 MG-UNIT TABS Take by mouth.     Cholecalciferol (VITAMIN D3) 25 MCG (1000 UT) CAPS Take by mouth daily.     CYCLOPHOSPHAMIDE IV Inject into the vein every 21 ( twenty-one) days.     DOXORUBICIN HCL IV Inject into the vein every 21 ( twenty-one) days.     erythromycin ophthalmic ointment SMARTSIG:1 In Eye(s) Every Night     ferrous sulfate 325 (65 FE) MG EC tablet Take 1 tablet (325 mg total) by mouth daily with breakfast. 30 tablet 3   gabapentin (NEURONTIN) 100 MG capsule Take 1 capsule (100 mg total) by mouth 2 (two) times daily. 60 capsule 3   Lactulose 20 GM/30ML SOLN Take 30 ml by mouth every 3 hours until a bowel movement occurs.  Then take 30 ml by mouth at bedtime. 450 mL 2   levothyroxine (SYNTHROID) 50 MCG tablet Take 1 tablet (50 mcg total) by mouth daily before breakfast. 90 tablet 1   lidocaine (XYLOCAINE) 2 % jelly Apply to affected area daily  prn     magnesium oxide (MAG-OX) 400 (240 Mg) MG tablet Take 1 tablet (400 mg total) by mouth 2 (two) times daily. 60 tablet 1   midodrine (PROAMATINE) 5 MG tablet TAKE 2 TABLETS EACH MORNING, 1 TABLET EACH AFTERNOON AND 1 TABLET EACH EVENING. 360 tablet 0   omeprazole (PRILOSEC) 10 MG capsule Take 1 capsule by mouth daily.     potassium chloride (KLOR-CON) 10 MEQ tablet Take 10 mEq by mouth 2 (two) times daily.     prednisoLONE acetate (PRED FORTE) 1 % ophthalmic suspension Place into the left eye.     predniSONE (DELTASONE) 10 MG tablet  Take 1 tablet (10 mg total) by mouth daily with breakfast. 95 tablet 1   predniSONE (DELTASONE) 20 MG tablet Take 100 mg (5 tablets) by mouth daily starting on each day of chemotherapy treatment, then 4 days after.  Repeat every 3 weeks. Do not take on the weeks you are not receiving treatment. 150 tablet 0   psyllium (REGULOID) 0.52 g capsule Take by mouth.     riTUXimab in sodium chloride 0.9 % 250 mL Inject into the vein every 21 ( twenty-one) days.     rosuvastatin (CRESTOR) 5 MG tablet Take 1 tablet by mouth at bedtime.     tamsulosin (FLOMAX) 0.4 MG CAPS capsule Take 1 capsule by mouth daily.     trifluridine (VIROPTIC) 1 % ophthalmic solution Place into the left eye.     valACYclovir (VALTREX) 500 MG tablet Take 500 mg by mouth 2 (two) times daily.     VINCRISTINE SULFATE IV Inject into the vein every 21 ( twenty-one) days.     vitamin B-12 (CYANOCOBALAMIN) 1000 MCG tablet Take 1 tablet (1,000 mcg total) by mouth daily. 30 tablet 3   warfarin (COUMADIN) 5 MG tablet Take 1 tablet (5 mg total) by mouth every evening.     lidocaine-prilocaine (EMLA) cream Apply a small amount to port a cath site and cover with plastic wrap 1 hour prior to chemotherapy appointments (Patient not taking: Reported on 05/24/2021) 30 g 3   prochlorperazine (COMPAZINE) 10 MG tablet Take 1 tablet (10 mg total) by mouth every 6 (six) hours as needed (Nausea or vomiting). (Patient not  taking: Reported on 05/24/2021) 30 tablet 6   No current facility-administered medications for this visit.    ALLERGIES:  Allergies  Allergen Reactions   Adhesive [Tape]     Contact dermatitis   Sulfa Antibiotics     UNKNOWN Childhood reaction    PHYSICAL EXAM:  Performance status (ECOG): 0 - Asymptomatic  Vitals:   05/24/21 0759  BP: 121/62  Pulse: 63  Resp: 20  Temp: (!) 96.9 F (36.1 C)  SpO2: 100%   Wt Readings from Last 3 Encounters:  05/24/21 229 lb 11.2 oz (104.2 kg)  05/12/21 232 lb (105.2 kg)  05/03/21 233 lb (105.7 kg)   Physical Exam Vitals reviewed.  Constitutional:      Appearance: Normal appearance.  Cardiovascular:     Rate and Rhythm: Normal rate and regular rhythm.     Pulses: Normal pulses.     Heart sounds: Normal heart sounds.  Pulmonary:     Effort: Pulmonary effort is normal.     Breath sounds: Normal breath sounds.  Abdominal:     Palpations: Abdomen is soft. There is no hepatomegaly, splenomegaly or mass.     Tenderness: There is no abdominal tenderness.  Neurological:     General: No focal deficit present.     Mental Status: He is alert and oriented to person, place, and time.  Psychiatric:        Mood and Affect: Mood normal.        Behavior: Behavior normal.    LABORATORY DATA:  I have reviewed the labs as listed.  CBC Latest Ref Rng & Units 05/24/2021 05/12/2021 05/03/2021  WBC 4.0 - 10.5 K/uL 10.9(H) 1.9(L) 6.7  Hemoglobin 13.0 - 17.0 g/dL 11.9(L) 10.3(L) 10.5(L)  Hematocrit 39.0 - 52.0 % 36.8(L) 31.9(L) 33.1(L)  Platelets 150 - 400 K/uL 379 128(L) 295   CMP Latest Ref Rng & Units 05/12/2021 05/03/2021 04/20/2021  Glucose  70 - 99 mg/dL 84 201(H) 101(H)  BUN 8 - 23 mg/dL $Remove'10 11 21  'JOPLbQS$ Creatinine 0.61 - 1.24 mg/dL 0.76 0.70 0.61  Sodium 135 - 145 mmol/L 134(L) 133(L) 134(L)  Potassium 3.5 - 5.1 mmol/L 3.8 3.4(L) 4.1  Chloride 98 - 111 mmol/L 96(L) 99 93(L)  CO2 22 - 32 mmol/L 27 27 33(H)  Calcium 8.9 - 10.3 mg/dL 9.1 8.4(L) 9.1   Total Protein 6.5 - 8.1 g/dL - 5.8(L) 5.9(L)  Total Bilirubin 0.3 - 1.2 mg/dL - 1.0 1.6(H)  Alkaline Phos 38 - 126 U/L - 78 81  AST 15 - 41 U/L - 20 27  ALT 0 - 44 U/L - 14 40    DIAGNOSTIC IMAGING:  I have independently reviewed the scans and discussed with the patient. DG Chest Portable 1 View  Result Date: 05/12/2021 CLINICAL DATA:  Cough, chest pain EXAM: PORTABLE CHEST 1 VIEW COMPARISON:  01/08/2021 FINDINGS: Transverse diameter of heart is in the upper limits normal. Thoracic aorta is tortuous and ectatic. There are no signs of pulmonary edema or new focal infiltrates. There is no pleural effusion or pneumothorax. Tip of right IJ chest port is seen in the superior vena cava. IMPRESSION: No active disease. Electronically Signed   By: Elmer Picker M.D.   On: 05/12/2021 09:05     ASSESSMENT:  1.  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  High-grade lymphoma panel was negative. - Weight loss of 40 pounds in the last 4 months, stable weight over the last 1 month.  No fevers or night sweats.  - Reviewed PET CT scan from 03/16/2021 which showed hypermetabolic enlarged spleen.  There are hypermetabolic neck lymph nodes.  Diffuse hypermetabolic activity within the marrow space of the spine.  Diffuse groundglass density within the lungs with moderate activity.    2.  Social/family history: - He lives at home by himself.  His son is present today with him. - He is independent of ADLs and IADLs.  Son lives within 30 minutes.  He was never smoker. - He has work-related exposure to dyes.  No pesticide exposure. - Brother died of bone cancer.  Another brother also died of bone cancer.   PLAN:  1.  Diffuse large B-cell lymphoma, stage IVb: - He has completed 2 cycles of R-CHOP chemotherapy. - He did not report any major GI  symptoms.  Numbness and tingling has been stable. - Review of labs from today shows improvement in white count and platelet count to normal.  Hemoglobin also improved to 11.9.  Differential was normal.  LDH was 132.  LFTs are normal. - Recommend proceeding with cycle 3 today without any dose modifications.  RTC 3 weeks for follow-up.  We will plan to repeat PET scan prior to next visit to evaluate response.    2.  Hyperuricemia: - Continue allopurinol 300 mg daily.  Uric acid is normal.    3.  Peripheral neuropathy: - Tingling and numbness in the bottom of the feet and right hand fingers is stable.  4.  High risk drug monitoring: - Echo on 01/09/2021 with EF 55-60%.  5.  Severe constipation: - Continue Colace and lactulose as needed.   Orders placed this encounter:  No orders of the defined types were placed in this encounter.    Derek Jack, MD [facility] [phone]  I, Thana Ates, am acting as a Education administrator for Dr. Derek Jack.  I, Derek Jack MD, have reviewed the above documentation for accuracy and completeness, and I agree with the above.

## 2021-05-24 NOTE — Progress Notes (Signed)
Patient has been examined by Dr. Katragadda, and vital signs and labs have been reviewed. ANC, Creatinine, LFTs, hemoglobin, and platelets are within treatment parameters per M.D. - pt may proceed with treatment.    °

## 2021-05-24 NOTE — Patient Instructions (Addendum)
[place] at [facility] Discharge Instructions   You were seen and examined today by Dr. Delton Coombes.  He reviewed the results of your labs which are normal/stable.  We will proceed with your third cycle of treatment today.  Return as scheduled for lab work, office visit, and treatment.      Thank you for choosing [place] at [facility] to provide your oncology and hematology care.  To afford each patient quality time with our provider, please arrive at least 15 minutes before your scheduled appointment time.   If you have a lab appointment with the [facility] please come in thru the Main Entrance and check in at the main information desk.  You need to re-schedule your appointment should you arrive 10 or more minutes late.  We strive to give you quality time with our providers, and arriving late affects you and other patients whose appointments are after yours.  Also, if you no show three or more times for appointments you may be dismissed from the clinic at the providers discretion.     Again, thank you for choosing [facility].  Our hope is that these requests will decrease the amount of time that you wait before being seen by our physicians.       _____________________________________________________________  Should you have questions after your visit to [facility], please contact our office at [phone] and follow the prompts.  Our office hours are 8:00 a.m. and 4:30 p.m. Monday - Friday.  Please note that voicemails left after 4:00 p.m. may not be returned until the following business day.  We are closed weekends and major holidays.  You do have access to a nurse 24-7, just call the main number to the clinic [phone] and do not press any options, hold on the line and a nurse will answer the phone.    For prescription refill requests, have your pharmacy contact our office and allow 72 hours.    Due  to Covid, you will need to wear a mask upon entering the hospital. If you do not have a mask, a mask will be given to you at the Main Entrance upon arrival. For doctor visits, patients may have 1 support person age 42 or older with them. For treatment visits, patients can not have anyone with them due to social distancing guidelines and our immunocompromised population.

## 2021-05-24 NOTE — Progress Notes (Signed)
Patient presents today for chemotherapy infusion.  Patient is in satisfactory condition with no new complaints voiced.  Vital signs are stable.  Labs reviewed by Dr. Delton Coombes during his office visit.  All labs are within treatment parameters.  We will proceed with treatment per MD orders.   Patient tolerated treatment well with no complaints voiced.  Patient left via wheelchair in stable condition.  Vital signs stable at discharge.  Follow up as scheduled.

## 2021-05-24 NOTE — Progress Notes (Signed)
Dr Delton Coombes has instructed team to infuse rituximab over 3 hours.  Orders updated to reflect new rate.  T.O. Dr Rhys Martini, PharmD

## 2021-05-24 NOTE — Patient Instructions (Signed)
[facility]  Discharge Instructions: Thank you for choosing [facility] to provide your oncology and hematology care.  If you have a lab appointment with the Cancer Center, please come in thru the Main Entrance and check in at the main information desk.  Wear comfortable clothing and clothing appropriate for easy access to any Portacath or PICC line.   We strive to give you quality time with your provider. You may need to reschedule your appointment if you arrive late (15 or more minutes).  Arriving late affects you and other patients whose appointments are after yours.  Also, if you miss three or more appointments without notifying the office, you may be dismissed from the clinic at the provider's discretion.      For prescription refill requests, have your pharmacy contact our office and allow 72 hours for refills to be completed.        To help prevent nausea and vomiting after your treatment, we encourage you to take your nausea medication as directed.  BELOW ARE SYMPTOMS THAT SHOULD BE REPORTED IMMEDIATELY: *FEVER GREATER THAN 100.4 F (38 C) OR HIGHER *CHILLS OR SWEATING *NAUSEA AND VOMITING THAT IS NOT CONTROLLED WITH YOUR NAUSEA MEDICATION *UNUSUAL SHORTNESS OF BREATH *UNUSUAL BRUISING OR BLEEDING *URINARY PROBLEMS (pain or burning when urinating, or frequent urination) *BOWEL PROBLEMS (unusual diarrhea, constipation, pain near the anus) TENDERNESS IN MOUTH AND THROAT WITH OR WITHOUT PRESENCE OF ULCERS (sore throat, sores in mouth, or a toothache) UNUSUAL RASH, SWELLING OR PAIN  UNUSUAL VAGINAL DISCHARGE OR ITCHING   Items with * indicate a potential emergency and should be followed up as soon as possible or go to the Emergency Department if any problems should occur.  Please show the CHEMOTHERAPY ALERT CARD or IMMUNOTHERAPY ALERT CARD at check-in to the Emergency Department and triage nurse.  Should you have questions after your visit or need to cancel  or reschedule your appointment, please contact [facility] [phone]  and follow the prompts.  Office hours are 8:00 a.m. to 4:30 p.m. Monday - Friday. Please note that voicemails left after 4:00 p.m. may not be returned until the following business day.  We are closed weekends and major holidays. You have access to a nurse at all times for urgent questions. Please call the main number to the clinic [phone] and follow the prompts.  For any non-urgent questions, you may also contact your provider using MyChart. We now offer e-Visits for anyone 18 and older to request care online for non-urgent symptoms. For details visit [email or web address].   Also download the MyChart app! Go to the app store, search "MyChart", open the app, select [place], and log in with your MyChart username and password.  Due to Covid, a mask is required upon entering the hospital/clinic. If you do not have a mask, one will be given to you upon arrival. For doctor visits, patients may have 1 support person aged 18 or older with them. For treatment visits, patients cannot have anyone with them due to current Covid guidelines and our immunocompromised population.  

## 2021-05-26 ENCOUNTER — Other Ambulatory Visit: Payer: Self-pay

## 2021-05-26 ENCOUNTER — Inpatient Hospital Stay (HOSPITAL_COMMUNITY): Payer: Medicare Other

## 2021-05-26 VITALS — BP 106/57 | HR 55 | Temp 96.6°F | Resp 20

## 2021-05-26 DIAGNOSIS — Z5112 Encounter for antineoplastic immunotherapy: Secondary | ICD-10-CM | POA: Diagnosis not present

## 2021-05-26 DIAGNOSIS — C8338 Diffuse large B-cell lymphoma, lymph nodes of multiple sites: Secondary | ICD-10-CM

## 2021-05-26 MED ORDER — PEGFILGRASTIM-BMEZ 6 MG/0.6ML ~~LOC~~ SOSY
6.0000 mg | PREFILLED_SYRINGE | Freq: Once | SUBCUTANEOUS | Status: AC
  Administered 2021-05-26: 6 mg via SUBCUTANEOUS
  Filled 2021-05-26: qty 0.6

## 2021-05-26 NOTE — Progress Notes (Signed)
Patient presents today for Ziextenzo injection.  Patient is in satisfactory condition with no complaints voiced.  Vital signs are stable.  We will proceed with treatment per MD orders.  ° °Patient tolerated injection with no complaints voiced.  Site clean and dry with no bruising or swelling noted.  No complaints of pain.  Discharged with vital signs stable and no signs or symptoms of distress noted.   °

## 2021-05-26 NOTE — Patient Instructions (Signed)
[facility]  Discharge Instructions: Thank you for choosing [facility] to provide your oncology and hematology care.  If you have a lab appointment with the Cancer Center, please come in thru the Main Entrance and check in at the main information desk.  Wear comfortable clothing and clothing appropriate for easy access to any Portacath or PICC line.   We strive to give you quality time with your provider. You may need to reschedule your appointment if you arrive late (15 or more minutes).  Arriving late affects you and other patients whose appointments are after yours.  Also, if you miss three or more appointments without notifying the office, you may be dismissed from the clinic at the provider's discretion.      For prescription refill requests, have your pharmacy contact our office and allow 72 hours for refills to be completed.        To help prevent nausea and vomiting after your treatment, we encourage you to take your nausea medication as directed.  BELOW ARE SYMPTOMS THAT SHOULD BE REPORTED IMMEDIATELY: *FEVER GREATER THAN 100.4 F (38 C) OR HIGHER *CHILLS OR SWEATING *NAUSEA AND VOMITING THAT IS NOT CONTROLLED WITH YOUR NAUSEA MEDICATION *UNUSUAL SHORTNESS OF BREATH *UNUSUAL BRUISING OR BLEEDING *URINARY PROBLEMS (pain or burning when urinating, or frequent urination) *BOWEL PROBLEMS (unusual diarrhea, constipation, pain near the anus) TENDERNESS IN MOUTH AND THROAT WITH OR WITHOUT PRESENCE OF ULCERS (sore throat, sores in mouth, or a toothache) UNUSUAL RASH, SWELLING OR PAIN  UNUSUAL VAGINAL DISCHARGE OR ITCHING   Items with * indicate a potential emergency and should be followed up as soon as possible or go to the Emergency Department if any problems should occur.  Please show the CHEMOTHERAPY ALERT CARD or IMMUNOTHERAPY ALERT CARD at check-in to the Emergency Department and triage nurse.  Should you have questions after your visit or need to cancel  or reschedule your appointment, please contact [facility] [phone]  and follow the prompts.  Office hours are 8:00 a.m. to 4:30 p.m. Monday - Friday. Please note that voicemails left after 4:00 p.m. may not be returned until the following business day.  We are closed weekends and major holidays. You have access to a nurse at all times for urgent questions. Please call the main number to the clinic [phone] and follow the prompts.  For any non-urgent questions, you may also contact your provider using MyChart. We now offer e-Visits for anyone 18 and older to request care online for non-urgent symptoms. For details visit [email or web address].   Also download the MyChart app! Go to the app store, search "MyChart", open the app, select [place], and log in with your MyChart username and password.  Due to Covid, a mask is required upon entering the hospital/clinic. If you do not have a mask, one will be given to you upon arrival. For doctor visits, patients may have 1 support person aged 18 or older with them. For treatment visits, patients cannot have anyone with them due to current Covid guidelines and our immunocompromised population.  

## 2021-05-31 ENCOUNTER — Encounter (HOSPITAL_COMMUNITY): Payer: Self-pay | Admitting: Hematology

## 2021-05-31 LAB — COMPREHENSIVE METABOLIC PANEL
ALT: 15 IU/L (ref 0–44)
AST: 21 IU/L (ref 0–40)
Albumin/Globulin Ratio: 2 (ref 1.2–2.2)
Albumin: 4.1 g/dL (ref 3.7–4.7)
Alkaline Phosphatase: 112 IU/L (ref 44–121)
BUN/Creatinine Ratio: 21 (ref 10–24)
BUN: 16 mg/dL (ref 8–27)
Bilirubin Total: 0.6 mg/dL (ref 0.0–1.2)
CO2: 29 mmol/L (ref 20–29)
Calcium: 9.7 mg/dL (ref 8.6–10.2)
Chloride: 97 mmol/L (ref 96–106)
Creatinine, Ser: 0.75 mg/dL — ABNORMAL LOW (ref 0.76–1.27)
Globulin, Total: 2.1 g/dL (ref 1.5–4.5)
Glucose: 105 mg/dL — ABNORMAL HIGH (ref 70–99)
Potassium: 4 mmol/L (ref 3.5–5.2)
Sodium: 140 mmol/L (ref 134–144)
Total Protein: 6.2 g/dL (ref 6.0–8.5)
eGFR: 96 mL/min/{1.73_m2} (ref >59)

## 2021-05-31 LAB — T4, FREE: Free T4: 0.75 ng/dL — ABNORMAL LOW (ref 0.82–1.77)

## 2021-05-31 LAB — TSH: TSH: 8.1 u[IU]/mL — ABNORMAL HIGH (ref 0.450–4.500)

## 2021-05-31 LAB — T3, FREE: T3, Free: 1.9 pg/mL — ABNORMAL LOW (ref 2.0–4.4)

## 2021-06-05 ENCOUNTER — Other Ambulatory Visit: Payer: Self-pay

## 2021-06-05 ENCOUNTER — Encounter: Payer: Self-pay | Admitting: "Endocrinology

## 2021-06-05 ENCOUNTER — Ambulatory Visit (INDEPENDENT_AMBULATORY_CARE_PROVIDER_SITE_OTHER): Payer: Medicare Other | Admitting: "Endocrinology

## 2021-06-05 VITALS — BP 112/78 | HR 76 | Ht 72.0 in | Wt 233.6 lb

## 2021-06-05 DIAGNOSIS — E2740 Unspecified adrenocortical insufficiency: Secondary | ICD-10-CM

## 2021-06-05 DIAGNOSIS — E039 Hypothyroidism, unspecified: Secondary | ICD-10-CM | POA: Diagnosis not present

## 2021-06-05 MED ORDER — FLUDROCORTISONE ACETATE 0.1 MG PO TABS: 0.1000 mg | ORAL_TABLET | Freq: Every day | ORAL | 1 refills | Status: DC

## 2021-06-05 MED ORDER — LEVOTHYROXINE SODIUM 88 MCG PO TABS
88.0000 ug | ORAL_TABLET | Freq: Every day | ORAL | 1 refills | Status: DC
Start: 2021-06-05 — End: 2021-09-06

## 2021-06-05 NOTE — Progress Notes (Signed)
06/05/2021, 3:54 PM   Subjective:    Patient ID: Roy [name], male    DOB: 28-Mar-1951, PCP Bridget Hartshorn, NP   Past Medical History:  Diagnosis Date   Atrial fibrillation ([place])    GERD (gastroesophageal reflux disease)    High cholesterol    Hypertension    Obesity    Past Surgical History:  Procedure Laterality Date   APPENDECTOMY     CATARACT EXTRACTION Bilateral 2022   CHOLECYSTECTOMY     ESOPHAGEAL DILATION     multiple times   IR IMAGING GUIDED PORT INSERTION  03/31/2021   JOINT REPLACEMENT Left    hip   Social History   Socioeconomic History   Marital status: Widowed    Spouse name: Not on file   Number of children: 3   Years of education: Not on file   Highest education level: Not on file  Occupational History   Occupation: Retired  Tobacco Use   Smoking status: Never   Smokeless tobacco: Never  Vaping Use   Vaping Use: Never used  Substance and Sexual Activity   Alcohol use: No   Drug use: No   Sexual activity: Not Currently  Other Topics Concern   Not on file  Social History Narrative   Not on file   Social Determinants of Health   Financial Resource Strain: Low Risk    Difficulty of Paying Living Expenses: Not hard at all  Food Insecurity: No Food Insecurity   Worried About Charity fundraiser in the Last Year: Never true   [place] in the Last Year: Never true  Transportation Needs: No Transportation Needs   Lack of Transportation (Medical): No   Lack of Transportation (Non-Medical): No  Physical Activity: Sufficiently Active   Days of Exercise per Week: 5 days   Minutes of Exercise per Session: 30 min  Stress: No Stress Concern Present   Feeling of Stress : Not at all  Social Connections: Moderately Isolated   Frequency of Communication with Friends and Family: More than three times a week   Frequency of Social Gatherings with Friends and Family: Three times a week    Attends Religious Services: 1 to 4 times per year   Active Member of Clubs or Organizations: No   Attends Archivist Meetings: Never   Marital Status: Widowed   Family History  Problem Relation Age of Onset   Heart failure Father    Heart attack Father        Deceased    Thyroid disease Sister    Outpatient Encounter Medications as of 06/05/2021  Medication Sig   fludrocortisone (FLORINEF) 0.1 MG tablet Take 1 tablet (0.1 mg total) by mouth daily with breakfast.   acetaminophen (TYLENOL) 325 MG tablet Take 2 tablets (650 mg total) by mouth every 6 (six) hours as needed for headache.   allopurinol (ZYLOPRIM) 300 MG tablet Take 1 tablet (300 mg total) by mouth daily.   Calcium Carbonate-Vitamin D (CALCIUM-VITAMIN D) 600-125 MG-UNIT TABS Take by mouth.   Cholecalciferol (VITAMIN D3) 25 MCG (1000 UT) CAPS Take by mouth daily.   CYCLOPHOSPHAMIDE IV Inject into the vein every 21 ( twenty-one) days.   DOXORUBICIN HCL IV  Inject into the vein every 21 ( twenty-one) days.   erythromycin ophthalmic ointment SMARTSIG:1 In Eye(s) Every Night   ferrous sulfate 325 (65 FE) MG EC tablet Take 1 tablet (325 mg total) by mouth daily with breakfast.   gabapentin (NEURONTIN) 100 MG capsule Take 1 capsule (100 mg total) by mouth 2 (two) times daily.   Lactulose 20 GM/30ML SOLN Take 30 ml by mouth every 3 hours until a bowel movement occurs.  Then take 30 ml by mouth at bedtime.   levothyroxine (SYNTHROID) 88 MCG tablet Take 1 tablet (88 mcg total) by mouth daily before breakfast.   lidocaine (XYLOCAINE) 2 % jelly Apply to affected area daily prn   lidocaine-prilocaine (EMLA) cream Apply a small amount to port a cath site and cover with plastic wrap 1 hour prior to chemotherapy appointments   magnesium oxide (MAG-OX) 400 (240 Mg) MG tablet Take 1 tablet (400 mg total) by mouth 2 (two) times daily.   midodrine (PROAMATINE) 5 MG tablet TAKE 2 TABLETS EACH MORNING, 1 TABLET EACH AFTERNOON AND 1 TABLET  EACH EVENING. (Patient taking differently: 5 mg 3 (three) times daily with meals.)   omeprazole (PRILOSEC) 10 MG capsule Take 1 capsule by mouth daily.   potassium chloride (KLOR-CON) 10 MEQ tablet Take 10 mEq by mouth 2 (two) times daily.   prednisoLONE acetate (PRED FORTE) 1 % ophthalmic suspension Place into the left eye.   predniSONE (DELTASONE) 10 MG tablet Take 1 tablet (10 mg total) by mouth daily with breakfast.   predniSONE (DELTASONE) 20 MG tablet Take 100 mg (5 tablets) by mouth daily starting on each day of chemotherapy treatment, then 4 days after.  Repeat every 3 weeks. Do not take on the weeks you are not receiving treatment.   prochlorperazine (COMPAZINE) 10 MG tablet Take 1 tablet (10 mg total) by mouth every 6 (six) hours as needed (Nausea or vomiting).   psyllium (REGULOID) 0.52 g capsule Take by mouth.   riTUXimab in sodium chloride 0.9 % 250 mL Inject into the vein every 21 ( twenty-one) days.   rosuvastatin (CRESTOR) 5 MG tablet Take 1 tablet by mouth at bedtime.   tamsulosin (FLOMAX) 0.4 MG CAPS capsule Take 1 capsule by mouth daily.   trifluridine (VIROPTIC) 1 % ophthalmic solution Place into the left eye.   valACYclovir (VALTREX) 500 MG tablet Take 500 mg by mouth 2 (two) times daily.   VINCRISTINE SULFATE IV Inject into the vein every 21 ( twenty-one) days.   vitamin B-12 (CYANOCOBALAMIN) 1000 MCG tablet Take 1 tablet (1,000 mcg total) by mouth daily.   warfarin (COUMADIN) 5 MG tablet Take 1 tablet (5 mg total) by mouth every evening.   [DISCONTINUED] levothyroxine (SYNTHROID) 50 MCG tablet Take 1 tablet (50 mcg total) by mouth daily before breakfast.   No facility-administered encounter medications on file as of 06/05/2021.   ALLERGIES: Allergies  Allergen Reactions   Adhesive [Tape]     Contact dermatitis   Sulfa Antibiotics     UNKNOWN Childhood reaction    VACCINATION STATUS: Immunization History  Administered Date(s) Administered   Influenza,inj,Quad  PF,6+ Mos 04/28/2015   Moderna Sars-Covid-2 Vaccination 07/03/2019, 07/31/2019   PFIZER(Purple Top)SARS-COV-2 Vaccination 03/25/2020   Pneumococcal Polysaccharide-23 04/28/2015    HPI Roy [name] is 71 y.o. male who presents today with a medical history as above. he is being seen in follow-up after he was seen in consultation for adrenal insufficiency requested by Bridget Hartshorn, NP.   His recent  repeat ACTH stimulation test confirmed primary adrenal insufficiency.    He was also diagnosed with hypothyroidism for which he was initiated on levothyroxine.  He is currently on prednisone 10 mg p.o. daily, midodrine 5 mg p.o. 3 times daily for blood pressure stabilization.   He denies any history of head injury, visual field deficits, no headaches.  He denies any prior history of abdominal surgery, injury, nor infections.   He reports steady weight after he lost 15 pounds in the last 2 months unintentionally.    He denies any exposure to heavy dose steroids in the past.  His other medical problems include hyperlipidemia on treatment with Crestor.  He has paroxysmal atrial fibrillation, left anterior fascicular block, currently on anticoagulation using Coumadin 5 mg every evening.    Review of Systems  Constitutional: + Recent weight loss, + fatigue, no subjective hyperthermia, no subjective hypothermia  Objective:    Vitals with BMI 06/05/2021 05/26/2021 05/24/2021  Height 6\' 0"  - -  Weight 233 lbs 10 oz - -  BMI 16.10 - -  Systolic 960 454 098  Diastolic 78 57 64  Pulse 76 55 50    BP 112/78    Pulse 76    Ht 6' (1.829 m)    Wt 233 lb 9.6 oz (106 kg)    BMI 31.68 kg/m   Wt Readings from Last 3 Encounters:  06/05/21 233 lb 9.6 oz (106 kg)  05/24/21 229 lb 11.2 oz (104.2 kg)  05/12/21 232 lb (105.2 kg)    Physical Exam  Constitutional:  Body mass index is 31.68 kg/m.,  not in acute distress, normal state of mind, +  walks with a cane   CMP ( most recent) CMP      Component Value Date/Time   NA 140 05/30/2021 0944   K 4.0 05/30/2021 0944   CL 97 05/30/2021 0944   CO2 29 05/30/2021 0944   GLUCOSE 105 (H) 05/30/2021 0944   GLUCOSE 103 (H) 05/24/2021 0832   BUN 16 05/30/2021 0944   CREATININE 0.75 (L) 05/30/2021 0944   CREATININE 0.82 11/11/2020 1046   CALCIUM 9.7 05/30/2021 0944   PROT 6.2 05/30/2021 0944   ALBUMIN 4.1 05/30/2021 0944   AST 21 05/30/2021 0944   AST 74 (H) 11/11/2020 1046   ALT 15 05/30/2021 0944   ALT 21 11/11/2020 1046   ALKPHOS 112 05/30/2021 0944   BILITOT 0.6 05/30/2021 0944   BILITOT 2.3 (H) 11/11/2020 1046   GFRNONAA >60 05/24/2021 0832   GFRNONAA >60 11/11/2020 1046   GFRAA >60 04/29/2019 1033     Diabetic Labs (most recent): Lab Results  Component Value Date   HGBA1C 5.1 01/08/2021     Lipid Panel ( most recent) Lipid Panel     Component Value Date/Time   CHOL 147 04/29/2015 0426   TRIG 153 (H) 04/29/2015 0426   HDL 19 (L) 04/29/2015 0426   CHOLHDL 7.7 04/29/2015 0426   VLDL 31 04/29/2015 0426   LDLCALC 97 04/29/2015 0426      Lab Results  Component Value Date   TSH 8.100 (H) 05/30/2021   TSH 0.770 02/23/2021   TSH 0.791 02/21/2021   TSH 0.036 (L) 01/08/2021   TSH 0.067 (L) 11/09/2020   TSH 0.030 (L) 10/21/2020   TSH 3.836 03/17/2018   TSH 6.505 (H) 04/27/2015   TSH 10.503 (H) 04/27/2015   FREET4 0.75 (L) 05/30/2021   FREET4 0.31 (L) 02/21/2021   FREET4 0.74 01/09/2021   FREET4 1.12 11/09/2020  FREET4 1.75 (H) 10/21/2020   FREET4 0.98 03/17/2018   FREET4 0.93 04/28/2015    Results for JACION, DISMORE (MRN [id number]) as of 03/02/2021 12:39  Ref. Range 02/23/2021 09:15  Cortisol, Base Latest Units: ug/dL <0.4  Cortisol, 30 Min Latest Units: ug/dL 1.2  Cortisol, 60 Min Latest Units: ug/dL 1.8    Assessment & Plan:   1. Adrenal insufficiency ([place]) 2.  Hypothyroidism  I discussed with him the need for steroids replacement.  He stands to benefit from steroid replacement.  He is  advised to continue prednisone 10 mg p.o. daily at breakfast.  He will also benefit from mineralocorticoid replacement.  I discussed and added fludrocortisone 0.1 mg daily at breakfast, to be advanced as needed. We discussed about sick day rules on prednisone.  -I have emphasized the absolute need for steroid replacement on a daily basis for long-term.  Regarding his hypothyroidism: His previsit labs are consistent with under replacement.  I discussed and increase his levothyroxine to 88 mcg p.o. daily before breakfast.     - We discussed about the correct intake of his thyroid hormone, on empty stomach at fasting, with water, separated by at least 30 minutes from breakfast and other medications,  and separated by more than 4 hours from calcium, iron, multivitamins, acid reflux medications (PPIs). -Patient is made aware of the fact that thyroid hormone replacement is needed for life, dose to be adjusted by periodic monitoring of thyroid function tests.    He is advised on water restriction, advised to drink 2 L of water daily down from 4 L daily.  He is advised to allow more Gatorade/electrolyte liquids.  His presenting hyponatremia may be multifactorial including SIADH. -He is encouraged to continue his midodrine 5 mg p.o. 3 times daily. -He is advised on fall precaution, encouraged to use his cane for equilibrium.     - he is advised to maintain close follow up with [place], Karie Schwalbe, NP for primary care needs.   I spent 30 minutes in the care of the patient today including review of labs from Thyroid Function, CMP, and other relevant labs ; imaging/biopsy records (current and previous including abstractions from other facilities); face-to-face time discussing  his lab results and symptoms, medications doses, his options of short and long term treatment based on the latest standards of care / guidelines;   and documenting the encounter.  Roy [name]  participated in the discussions,  expressed understanding, and voiced agreement with the above plans.  All questions were answered to his satisfaction. he is encouraged to contact clinic should he have any questions or concerns prior to his return visit.    Follow up plan: Return in about 4 months (around 10/03/2021) for F/U with Pre-visit Labs.   Glade Lloyd, MD [facility] [address] [place], [place] [address] Phone: [phone]  Fax: [phone]     06/05/2021, 3:54 PM  This note was partially dictated with voice recognition software. Similar sounding words can be transcribed inadequately or may not  be corrected upon review.  Dexamethasone

## 2021-06-08 ENCOUNTER — Other Ambulatory Visit (HOSPITAL_COMMUNITY): Payer: Self-pay | Admitting: Physician Assistant

## 2021-06-08 ENCOUNTER — Ambulatory Visit (HOSPITAL_COMMUNITY)
Admission: RE | Admit: 2021-06-08 | Discharge: 2021-06-08 | Disposition: A | Discharge status: Home / Self Care | Payer: Medicare Other | Source: Ambulatory Visit | Attending: Hematology | Admitting: Hematology

## 2021-06-08 ENCOUNTER — Other Ambulatory Visit: Payer: Self-pay

## 2021-06-08 DIAGNOSIS — C8338 Diffuse large B-cell lymphoma, lymph nodes of multiple sites: Secondary | ICD-10-CM | POA: Diagnosis present

## 2021-06-08 DIAGNOSIS — K5900 Constipation, unspecified: Secondary | ICD-10-CM

## 2021-06-08 MED ORDER — FLUDEOXYGLUCOSE F - 18 (FDG) INJECTION
12.6400 | Freq: Once | INTRAVENOUS | Status: AC | PRN
  Administered 2021-06-08: 12.64 via INTRAVENOUS

## 2021-06-14 ENCOUNTER — Other Ambulatory Visit: Payer: Self-pay

## 2021-06-14 ENCOUNTER — Inpatient Hospital Stay (HOSPITAL_COMMUNITY): Payer: Medicare Other

## 2021-06-14 ENCOUNTER — Inpatient Hospital Stay (HOSPITAL_COMMUNITY): Payer: Medicare Other | Attending: Hematology | Admitting: Hematology

## 2021-06-14 VITALS — BP 137/59 | HR 56 | Temp 97.6°F | Resp 18

## 2021-06-14 DIAGNOSIS — Z5111 Encounter for antineoplastic chemotherapy: Secondary | ICD-10-CM | POA: Insufficient documentation

## 2021-06-14 DIAGNOSIS — C8330 Diffuse large B-cell lymphoma, unspecified site: Secondary | ICD-10-CM | POA: Insufficient documentation

## 2021-06-14 DIAGNOSIS — G629 Polyneuropathy, unspecified: Secondary | ICD-10-CM | POA: Insufficient documentation

## 2021-06-14 DIAGNOSIS — C8338 Diffuse large B-cell lymphoma, lymph nodes of multiple sites: Secondary | ICD-10-CM

## 2021-06-14 DIAGNOSIS — Z5112 Encounter for antineoplastic immunotherapy: Secondary | ICD-10-CM | POA: Diagnosis present

## 2021-06-14 DIAGNOSIS — E790 Hyperuricemia without signs of inflammatory arthritis and tophaceous disease: Secondary | ICD-10-CM | POA: Insufficient documentation

## 2021-06-14 DIAGNOSIS — Z5189 Encounter for other specified aftercare: Secondary | ICD-10-CM | POA: Insufficient documentation

## 2021-06-14 DIAGNOSIS — I4891 Unspecified atrial fibrillation: Secondary | ICD-10-CM | POA: Insufficient documentation

## 2021-06-14 DIAGNOSIS — D696 Thrombocytopenia, unspecified: Secondary | ICD-10-CM

## 2021-06-14 DIAGNOSIS — Z79899 Other long term (current) drug therapy: Secondary | ICD-10-CM | POA: Insufficient documentation

## 2021-06-14 DIAGNOSIS — D72819 Decreased white blood cell count, unspecified: Secondary | ICD-10-CM | POA: Diagnosis not present

## 2021-06-14 DIAGNOSIS — Z7901 Long term (current) use of anticoagulants: Secondary | ICD-10-CM | POA: Diagnosis not present

## 2021-06-14 DIAGNOSIS — I10 Essential (primary) hypertension: Secondary | ICD-10-CM | POA: Insufficient documentation

## 2021-06-14 DIAGNOSIS — Z7952 Long term (current) use of systemic steroids: Secondary | ICD-10-CM | POA: Insufficient documentation

## 2021-06-14 LAB — COMPREHENSIVE METABOLIC PANEL
ALT: 15 U/L (ref 0–44)
AST: 20 U/L (ref 15–41)
Albumin: 3.8 g/dL (ref 3.5–5.0)
Alkaline Phosphatase: 65 U/L (ref 38–126)
Anion gap: 8 (ref 5–15)
BUN: 14 mg/dL (ref 8–23)
CO2: 30 mmol/L (ref 22–32)
Calcium: 9.1 mg/dL (ref 8.9–10.3)
Chloride: 99 mmol/L (ref 98–111)
Creatinine, Ser: 0.69 mg/dL (ref 0.61–1.24)
GFR, Estimated: >60 mL/min (ref >60)
Glucose, Bld: 93 mg/dL (ref 70–99)
Potassium: 3.9 mmol/L (ref 3.5–5.1)
Sodium: 137 mmol/L (ref 135–145)
Total Bilirubin: 0.9 mg/dL (ref 0.3–1.2)
Total Protein: 6.6 g/dL (ref 6.5–8.1)

## 2021-06-14 LAB — CBC WITH DIFFERENTIAL/PLATELET
Abs Immature Granulocytes: 0.1 10*3/uL — ABNORMAL HIGH (ref 0.00–0.07)
Basophils Absolute: 0.1 10*3/uL (ref 0.0–0.1)
Basophils Relative: 1 %
Eosinophils Absolute: 0 10*3/uL (ref 0.0–0.5)
Eosinophils Relative: 0 %
HCT: 37.1 % — ABNORMAL LOW (ref 39.0–52.0)
Hemoglobin: 12 g/dL — ABNORMAL LOW (ref 13.0–17.0)
Immature Granulocytes: 1 %
Lymphocytes Relative: 8 %
Lymphs Abs: 1.1 10*3/uL (ref 0.7–4.0)
MCH: 32.4 pg (ref 26.0–34.0)
MCHC: 32.3 g/dL (ref 30.0–36.0)
MCV: 100.3 fL — ABNORMAL HIGH (ref 80.0–100.0)
Monocytes Absolute: 0.3 10*3/uL (ref 0.1–1.0)
Monocytes Relative: 2 %
Neutro Abs: 12.3 10*3/uL — ABNORMAL HIGH (ref 1.7–7.7)
Neutrophils Relative %: 88 %
Platelets: 249 10*3/uL (ref 150–400)
RBC: 3.7 MIL/uL — ABNORMAL LOW (ref 4.22–5.81)
RDW: 17.4 % — ABNORMAL HIGH (ref 11.5–15.5)
WBC: 13.9 10*3/uL — ABNORMAL HIGH (ref 4.0–10.5)
nRBC: 0 % (ref 0.0–0.2)

## 2021-06-14 LAB — URIC ACID: Uric Acid, Serum: 4.7 mg/dL (ref 3.7–8.6)

## 2021-06-14 LAB — MAGNESIUM: Magnesium: 2 mg/dL (ref 1.7–2.4)

## 2021-06-14 LAB — PHOSPHORUS: Phosphorus: 3.3 mg/dL (ref 2.5–4.6)

## 2021-06-14 LAB — LACTATE DEHYDROGENASE: LDH: 139 U/L (ref 98–192)

## 2021-06-14 MED ORDER — VINCRISTINE SULFATE CHEMO INJECTION 1 MG/ML
2.0000 mg | Freq: Once | INTRAVENOUS | Status: AC
  Administered 2021-06-14: 2 mg via INTRAVENOUS
  Filled 2021-06-14: qty 2

## 2021-06-14 MED ORDER — SODIUM CHLORIDE 0.9% FLUSH
10.0000 mL | INTRAVENOUS | Status: DC | PRN
  Administered 2021-06-14: 10 mL

## 2021-06-14 MED ORDER — PALONOSETRON HCL INJECTION 0.25 MG/5ML
0.2500 mg | Freq: Once | INTRAVENOUS | Status: AC
  Administered 2021-06-14: 0.25 mg via INTRAVENOUS
  Filled 2021-06-14: qty 5

## 2021-06-14 MED ORDER — DOXORUBICIN HCL CHEMO IV INJECTION 2 MG/ML
50.0000 mg/m2 | Freq: Once | INTRAVENOUS | Status: AC
  Administered 2021-06-14: 116 mg via INTRAVENOUS
  Filled 2021-06-14: qty 58

## 2021-06-14 MED ORDER — FAMOTIDINE IN NACL 20-0.9 MG/50ML-% IV SOLN
20.0000 mg | Freq: Once | INTRAVENOUS | Status: AC
  Administered 2021-06-14: 20 mg via INTRAVENOUS
  Filled 2021-06-14: qty 50

## 2021-06-14 MED ORDER — SODIUM CHLORIDE 0.9 % IV SOLN
150.0000 mg | Freq: Once | INTRAVENOUS | Status: AC
  Administered 2021-06-14: 150 mg via INTRAVENOUS
  Filled 2021-06-14: qty 5

## 2021-06-14 MED ORDER — SODIUM CHLORIDE 0.9 % IV SOLN
10.0000 mg | Freq: Once | INTRAVENOUS | Status: AC
  Administered 2021-06-14: 10 mg via INTRAVENOUS
  Filled 2021-06-14: qty 1

## 2021-06-14 MED ORDER — SODIUM CHLORIDE 0.9 % IV SOLN
750.0000 mg/m2 | Freq: Once | INTRAVENOUS | Status: AC
  Administered 2021-06-14: 1740 mg via INTRAVENOUS
  Filled 2021-06-14: qty 87

## 2021-06-14 MED ORDER — SODIUM CHLORIDE 0.9 % IV SOLN: Freq: Once | INTRAVENOUS | Status: AC

## 2021-06-14 MED ORDER — DIPHENHYDRAMINE HCL 25 MG PO CAPS
50.0000 mg | ORAL_CAPSULE | Freq: Once | ORAL | Status: AC
  Administered 2021-06-14: 50 mg via ORAL
  Filled 2021-06-14: qty 2

## 2021-06-14 MED ORDER — ACETAMINOPHEN 325 MG PO TABS
650.0000 mg | ORAL_TABLET | Freq: Once | ORAL | Status: AC
  Administered 2021-06-14: 650 mg via ORAL
  Filled 2021-06-14: qty 2

## 2021-06-14 MED ORDER — HEPARIN SOD (PORK) LOCK FLUSH 100 UNIT/ML IV SOLN
500.0000 [IU] | Freq: Once | INTRAVENOUS | Status: AC | PRN
  Administered 2021-06-14: 500 [IU]

## 2021-06-14 MED ORDER — SODIUM CHLORIDE 0.9 % IV SOLN
375.0000 mg/m2 | Freq: Once | INTRAVENOUS | Status: AC
  Administered 2021-06-14: 900 mg via INTRAVENOUS
  Filled 2021-06-14: qty 40

## 2021-06-14 NOTE — Progress Notes (Signed)
[place], [place] [address]   CLINIC:  Medical Oncology/Hematology  PCP:  Bridget Hartshorn, NP [address] [place] [id number] [phone]   REASON FOR VISIT:  Follow-up for diffuse large B-cell lymphoma  PRIOR THERAPY: none  NGS Results: not done  CURRENT THERAPY: R-CHOP every 3 weeks  BRIEF ONCOLOGIC HISTORY:  Oncology History  DLBCL (diffuse large B cell lymphoma) ([place])  03/22/2021 Initial Diagnosis   DLBCL (diffuse large B cell lymphoma) ([place])   03/22/2021 Cancer Staging   Staging form: Hodgkin and Non-Hodgkin Lymphoma, AJCC 8th Edition - Clinical stage from 03/22/2021: Stage IV (Diffuse large B-cell lymphoma) - Signed by Derek Jack, MD on 03/22/2021    04/12/2021 -  Chemotherapy   Patient is on Treatment Plan : NON-HODGKINS LYMPHOMA R-CHOP q21d       CANCER STAGING:  Cancer Staging  DLBCL (diffuse large B cell lymphoma) ([place]) Staging form: Hodgkin and Non-Hodgkin Lymphoma, AJCC 8th Edition - Clinical stage from 03/22/2021: Stage IV (Diffuse large B-cell lymphoma) - Signed by Derek Jack, MD on 03/22/2021   INTERVAL HISTORY:  [name], a 71 y.o. male, returns for routine follow-up and consideration for next cycle of chemotherapy. Mandy was last seen on 05/24/2021.  Due for cycle #4 of R-CHOP today.   Overall, he tells me he has been feeling pretty well. He denies tingling/numbness, n/v/d/c, SOB, and orthopnea. He has gained 6 lbs since 02/01.   Overall, he feels ready for next cycle of chemo today.    REVIEW OF SYSTEMS:  Review of Systems  Constitutional:  Negative for appetite change, fatigue and unexpected weight change.  Respiratory:  Negative for shortness of breath.   Gastrointestinal:  Negative for constipation, diarrhea, nausea and vomiting.  Neurological:  Negative for numbness.  All other systems reviewed and are negative.  PAST MEDICAL/SURGICAL  HISTORY:  Past Medical History:  Diagnosis Date   Atrial fibrillation (HCC)    GERD (gastroesophageal reflux disease)    High cholesterol    Hypertension    Obesity    Past Surgical History:  Procedure Laterality Date   APPENDECTOMY     CATARACT EXTRACTION Bilateral 2022   CHOLECYSTECTOMY     ESOPHAGEAL DILATION     multiple times   IR IMAGING GUIDED PORT INSERTION  03/31/2021   JOINT REPLACEMENT Left    hip    SOCIAL HISTORY:  Social History   Socioeconomic History   Marital status: Widowed    Spouse name: Not on file   Number of children: 3   Years of education: Not on file   Highest education level: Not on file  Occupational History   Occupation: Retired  Tobacco Use   Smoking status: Never   Smokeless tobacco: Never  Vaping Use   Vaping Use: Never used  Substance and Sexual Activity   Alcohol use: No   Drug use: No   Sexual activity: Not Currently  Other Topics Concern   Not on file  Social History Narrative   Not on file   Social Determinants of Health   Financial Resource Strain: Low Risk    Difficulty of Paying Living Expenses: Not hard at all  Food Insecurity: No Food Insecurity   Worried About Charity fundraiser in the Last Year: Never true   [place] in the Last Year: Never true  Transportation Needs: No Transportation Needs   Lack of Transportation (Medical): No  Lack of Transportation (Non-Medical): No  Physical Activity: Sufficiently Active   Days of Exercise per Week: 5 days   Minutes of Exercise per Session: 30 min  Stress: No Stress Concern Present   Feeling of Stress : Not at all  Social Connections: Moderately Isolated   Frequency of Communication with Friends and Family: More than three times a week   Frequency of Social Gatherings with Friends and Family: Three times a week   Attends Religious Services: 1 to 4 times per year   Active Member of Clubs or Organizations: No   Attends Archivist Meetings: Never    Marital Status: Widowed  Human resources officer Violence: Not At Risk   Fear of Current or Ex-Partner: No   Emotionally Abused: No   Physically Abused: No   Sexually Abused: No    FAMILY HISTORY:  Family History  Problem Relation Age of Onset   Heart failure Father    Heart attack Father        Deceased    Thyroid disease Sister     CURRENT MEDICATIONS:  Current Outpatient Medications  Medication Sig Dispense Refill   acetaminophen (TYLENOL) 325 MG tablet Take 2 tablets (650 mg total) by mouth every 6 (six) hours as needed for headache. 30 tablet 1   allopurinol (ZYLOPRIM) 300 MG tablet Take 1 tablet (300 mg total) by mouth daily. 30 tablet 3   Calcium Carbonate-Vitamin D (CALCIUM-VITAMIN D) 600-125 MG-UNIT TABS Take by mouth.     Cholecalciferol (VITAMIN D3) 25 MCG (1000 UT) CAPS Take by mouth daily.     CYCLOPHOSPHAMIDE IV Inject into the vein every 21 ( twenty-one) days.     DOXORUBICIN HCL IV Inject into the vein every 21 ( twenty-one) days.     EQUATE STOOL SOFTENER 100 MG capsule Take 2 capsules by mouth twice daily 120 capsule 0   erythromycin ophthalmic ointment SMARTSIG:1 In Eye(s) Every Night     ferrous sulfate 325 (65 FE) MG EC tablet Take 1 tablet (325 mg total) by mouth daily with breakfast. 30 tablet 3   fludrocortisone (FLORINEF) 0.1 MG tablet Take 1 tablet (0.1 mg total) by mouth daily with breakfast. 90 tablet 1   gabapentin (NEURONTIN) 100 MG capsule Take 1 capsule (100 mg total) by mouth 2 (two) times daily. 60 capsule 3   Lactulose 20 GM/30ML SOLN Take 30 ml by mouth every 3 hours until a bowel movement occurs.  Then take 30 ml by mouth at bedtime. 450 mL 2   levothyroxine (SYNTHROID) 88 MCG tablet Take 1 tablet (88 mcg total) by mouth daily before breakfast. 90 tablet 1   lidocaine (XYLOCAINE) 2 % jelly Apply to affected area daily prn     magnesium oxide (MAG-OX) 400 (240 Mg) MG tablet Take 1 tablet (400 mg total) by mouth 2 (two) times daily. 60 tablet 1    midodrine (PROAMATINE) 5 MG tablet TAKE 2 TABLETS EACH MORNING, 1 TABLET EACH AFTERNOON AND 1 TABLET EACH EVENING. (Patient taking differently: 5 mg 3 (three) times daily with meals.) 360 tablet 0   omeprazole (PRILOSEC) 10 MG capsule Take 1 capsule by mouth daily.     potassium chloride (KLOR-CON) 10 MEQ tablet Take 10 mEq by mouth 2 (two) times daily.     prednisoLONE acetate (PRED FORTE) 1 % ophthalmic suspension Place into the left eye.     predniSONE (DELTASONE) 10 MG tablet Take 1 tablet (10 mg total) by mouth daily with breakfast. 95 tablet 1  predniSONE (DELTASONE) 20 MG tablet Take 100 mg (5 tablets) by mouth daily starting on each day of chemotherapy treatment, then 4 days after.  Repeat every 3 weeks. Do not take on the weeks you are not receiving treatment. 150 tablet 0   psyllium (REGULOID) 0.52 g capsule Take by mouth.     riTUXimab in sodium chloride 0.9 % 250 mL Inject into the vein every 21 ( twenty-one) days.     rosuvastatin (CRESTOR) 5 MG tablet Take 1 tablet by mouth at bedtime.     tamsulosin (FLOMAX) 0.4 MG CAPS capsule Take 1 capsule by mouth daily.     trifluridine (VIROPTIC) 1 % ophthalmic solution Place into the left eye.     valACYclovir (VALTREX) 500 MG tablet Take 500 mg by mouth 2 (two) times daily.     VINCRISTINE SULFATE IV Inject into the vein every 21 ( twenty-one) days.     vitamin B-12 (CYANOCOBALAMIN) 1000 MCG tablet Take 1 tablet (1,000 mcg total) by mouth daily. 30 tablet 3   warfarin (COUMADIN) 5 MG tablet Take 1 tablet (5 mg total) by mouth every evening.     lidocaine-prilocaine (EMLA) cream Apply a small amount to port a cath site and cover with plastic wrap 1 hour prior to chemotherapy appointments (Patient not taking: Reported on 06/14/2021) 30 g 3   prochlorperazine (COMPAZINE) 10 MG tablet Take 1 tablet (10 mg total) by mouth every 6 (six) hours as needed (Nausea or vomiting). (Patient not taking: Reported on 06/14/2021) 30 tablet 6   No current  facility-administered medications for this visit.    ALLERGIES:  Allergies  Allergen Reactions   Adhesive [Tape]     Contact dermatitis   Sulfa Antibiotics     UNKNOWN Childhood reaction    PHYSICAL EXAM:  Performance status (ECOG): 0 - Asymptomatic  There were no vitals filed for this visit. Wt Readings from Last 3 Encounters:  06/14/21 239 lb (108.4 kg)  06/05/21 233 lb 9.6 oz (106 kg)  05/24/21 229 lb 11.2 oz (104.2 kg)   Physical Exam Vitals reviewed.  Constitutional:      Appearance: Normal appearance. He is obese.  Cardiovascular:     Rate and Rhythm: Normal rate and regular rhythm.     Pulses: Normal pulses.     Heart sounds: Normal heart sounds.  Pulmonary:     Effort: Pulmonary effort is normal.     Breath sounds: Normal breath sounds.  Musculoskeletal:     Right lower leg: No edema.     Left lower leg: No edema.  Neurological:     General: No focal deficit present.     Mental Status: He is alert and oriented to person, place, and time.  Psychiatric:        Mood and Affect: Mood normal.        Behavior: Behavior normal.    LABORATORY DATA:  I have reviewed the labs as listed.  CBC Latest Ref Rng & Units 06/14/2021 05/24/2021 05/12/2021  WBC 4.0 - 10.5 K/uL 13.9(H) 10.9(H) 1.9(L)  Hemoglobin 13.0 - 17.0 g/dL 12.0(L) 11.9(L) 10.3(L)  Hematocrit 39.0 - 52.0 % 37.1(L) 36.8(L) 31.9(L)  Platelets 150 - 400 K/uL 249 379 128(L)   CMP Latest Ref Rng & Units 06/14/2021 05/30/2021 05/24/2021  Glucose 70 - 99 mg/dL 93 105(H) 103(H)  BUN 8 - 23 mg/dL 14 16 21   Creatinine 0.61 - 1.24 mg/dL 0.69 0.75(L) 0.72  Sodium 135 - 145 mmol/L 137 140 136  Potassium  3.5 - 5.1 mmol/L 3.9 4.0 4.3  Chloride 98 - 111 mmol/L 99 97 97(L)  CO2 22 - 32 mmol/L 30 29 30   Calcium 8.9 - 10.3 mg/dL 9.1 9.7 9.1  Total Protein 6.5 - 8.1 g/dL 6.6 6.2 6.6  Total Bilirubin 0.3 - 1.2 mg/dL 0.9 0.6 0.7  Alkaline Phos 38 - 126 U/L 65 112 66  AST 15 - 41 U/L 20 21 25   ALT 0 - 44 U/L 15 15 18      DIAGNOSTIC IMAGING:  I have independently reviewed the scans and discussed with the patient. NM PET Image Restag (PS) Skull Base To Thigh  Result Date: 06/10/2021 CLINICAL DATA:  Subsequent treatment strategy for lymphoma. Recent chemotherapy. EXAM: NUCLEAR MEDICINE PET SKULL BASE TO THIGH TECHNIQUE: 12.64 mCi F-18 FDG was injected intravenously. Full-ring PET imaging was performed from the skull base to thigh after the radiotracer. CT data was obtained and used for attenuation correction and anatomic localization. Fasting blood glucose: 78 mg/dl COMPARISON:  CT neck 03/28/2021, PET-CT 03/16/2021 and abdominopelvic CT 10/21/2020 FINDINGS: Mediastinal blood pool activity: SUV max 2.4 Liver activity: SUV max 3.5 NECK: No hypermetabolic cervical lymph nodes are identified.There are no lesions of the pharyngeal mucosal space. Hypermetabolic parotid nodules are again noted bilaterally. The largest right-sided nodule measures 1.5 x 0.7 cm on image 33/3 and has an SUV max of 12.0 (previously 6.9). Left-sided nodule involving the deep lobe measures 1.7 x 0.9 cm on image 22/3 and has an SUV max of 10.0 (previously 8.8). These nodules have been evaluated by neck CT. Incidental CT findings: Bilateral carotid atherosclerosis. CHEST: There are no hypermetabolic mediastinal, hilar or axillary lymph nodes. No hypermetabolic pulmonary activity or suspicious nodularity. Incidental CT findings: The previously demonstrated hypermetabolic ground-glass opacities throughout both lungs have resolved. Right IJ Port-A-Cath extends to the mid SVC. Mild aortic and coronary artery atherosclerosis with calcifications of the aortic valve. ABDOMEN/PELVIS: Interval resolution of previously demonstrated hypermetabolic splenic activity and splenomegaly. The splenic maximum SUV currently is 2.7 (previously 9.9. No hypermetabolic activity within the liver, adrenal glands or pancreas. There is no hypermetabolic nodal activity. Incidental CT  findings: Previous cholecystectomy. Mild aortic and branch vessel atherosclerosis. SKELETON: There is no residual focal hypermetabolic activity to suggest osseous metastatic disease. Incidental CT findings: Multilevel lumbar spondylosis. The sacroiliac joints are ankylosed. Previous left total hip arthroplasty. IMPRESSION: 1. The previously demonstrated bilateral parotid lesions have not grossly changed in size, although their metabolic activity has increased compared with the prior study. Given improvement in the other activity related to the patient's lymphoma, these are likely unrelated parotid tumors as described on prior neck CT. Continued follow-up recommended. 2. No residual hypermetabolic activity within the spleen or bone marrow. No hypermetabolic nodal activity. [place] 1. 3. Previously demonstrated hypermetabolic ground-glass opacities throughout both lungs have resolved. Electronically Signed   By: Richardean Sale M.D.   On: 06/10/2021 12:11     ASSESSMENT:  1.  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  High-grade lymphoma panel was negative. - Weight loss of 40 pounds in the last 4 months, stable weight over the last 1 month.  No fevers or night sweats.  - Reviewed PET CT scan from 03/16/2021 which showed hypermetabolic enlarged spleen.  There are hypermetabolic neck lymph nodes.  Diffuse hypermetabolic activity within the marrow space of the spine.  Diffuse groundglass density within  the lungs with moderate activity.    2.  Social/family history: - He lives at home by himself.  His son is present today with him. - He is independent of ADLs and IADLs.  Son lives within 30 minutes.  He was never smoker. - He has work-related exposure to dyes.  No pesticide exposure. - Brother died of bone cancer.  Another brother also died  of bone cancer.   PLAN:  1.  Diffuse large B-cell lymphoma, stage IVb: - He has completed 3 cycles of chemotherapy. - We have reviewed PET scan from 06/08/2021 which showed no residual hypermetabolic activity within the spleen or bone marrow.  Previously hypermetabolic groundglass opacities in both lungs resolved. - Reviewed labs today which showed improvement in cytopenias.  LFTs are normal. - Proceed with cycle 4 today.  RTC 3 weeks for follow-up.    2.  Hyperuricemia: - Continue allopurinol daily.  Uric acid is normal.    3.  Peripheral neuropathy: - Numbness and tingling in the bottom of the feet and right hand 3 fingers is stable.  4.  High risk drug monitoring: - Echo on 01/09/2021 with EF 55-60%.  5.  Parotid masses: - PET scan on 06/08/2021 showed bilateral parotid lesions stable in size.  Metabolic activity has increased since prior study.  Likely unrelated parotid tumors.  We will follow-up after lymphoma treatment done.   Orders placed this encounter:  No orders of the defined types were placed in this encounter.    Derek Jack, MD [facility] [phone]   I, Thana Ates, am acting as a scribe for Dr. Derek Jack.  I, Derek Jack MD, have reviewed the above documentation for accuracy and completeness, and I agree with the above.

## 2021-06-14 NOTE — Patient Instructions (Signed)
[place] at [facility] Discharge Instructions   You were seen and examined today by Dr. Delton Coombes.  He reviewed the results of your lab work and PET scan - all results were normal/stable.  Your PET scan shows complete resolution of the lymphoma.  We will proceed with Cycle 4 of treatment today.  Return as scheduled in 3 weeks.    Thank you for choosing [place] at [facility] to provide your oncology and hematology care.  To afford each patient quality time with our provider, please arrive at least 15 minutes before your scheduled appointment time.   If you have a lab appointment with the [facility] please come in thru the Main Entrance and check in at the main information desk.  You need to re-schedule your appointment should you arrive 10 or more minutes late.  We strive to give you quality time with our providers, and arriving late affects you and other patients whose appointments are after yours.  Also, if you no show three or more times for appointments you may be dismissed from the clinic at the providers discretion.     Again, thank you for choosing [facility].  Our hope is that these requests will decrease the amount of time that you wait before being seen by our physicians.       _____________________________________________________________  Should you have questions after your visit to [facility], please contact our office at [phone] and follow the prompts.  Our office hours are 8:00 a.m. and 4:30 p.m. Monday - Friday.  Please note that voicemails left after 4:00 p.m. may not be returned until the following business day.  We are closed weekends and major holidays.  You do have access to a nurse 24-7, just call the main number to the clinic [phone] and do not press any options, hold on the line and a nurse will answer the phone.    For prescription refill requests, have your pharmacy contact  our office and allow 72 hours.    Due to Covid, you will need to wear a mask upon entering the hospital. If you do not have a mask, a mask will be given to you at the Main Entrance upon arrival. For doctor visits, patients may have 1 support person age 59 or older with them. For treatment visits, patients can not have anyone with them due to social distancing guidelines and our immunocompromised population.

## 2021-06-14 NOTE — Progress Notes (Signed)
Patient has been examined by Dr. Katragadda, and vital signs and labs have been reviewed. ANC, Creatinine, LFTs, hemoglobin, and platelets are within treatment parameters per M.D. - pt may proceed with treatment.    °

## 2021-06-14 NOTE — Patient Instructions (Signed)
[facility]  Discharge Instructions: Thank you for choosing [facility] to provide your oncology and hematology care.  If you have a lab appointment with the [facility], please come in thru the Main Entrance and check in at the main information desk.  Wear comfortable clothing and clothing appropriate for easy access to any Portacath or PICC line.   We strive to give you quality time with your provider. You may need to reschedule your appointment if you arrive late (15 or more minutes).  Arriving late affects you and other patients whose appointments are after yours.  Also, if you miss three or more appointments without notifying the office, you may be dismissed from the clinic at the providers discretion.      For prescription refill requests, have your pharmacy contact our office and allow 72 hours for refills to be completed.    Today you received the following chemotherapy and/or immunotherapy agents RCHOP   To help prevent nausea and vomiting after your treatment, we encourage you to take your nausea medication as directed.  BELOW ARE SYMPTOMS THAT SHOULD BE REPORTED IMMEDIATELY: *FEVER GREATER THAN 100.4 F (38 C) OR HIGHER *CHILLS OR SWEATING *NAUSEA AND VOMITING THAT IS NOT CONTROLLED WITH YOUR NAUSEA MEDICATION *UNUSUAL SHORTNESS OF BREATH *UNUSUAL BRUISING OR BLEEDING *URINARY PROBLEMS (pain or burning when urinating, or frequent urination) *BOWEL PROBLEMS (unusual diarrhea, constipation, pain near the anus) TENDERNESS IN MOUTH AND THROAT WITH OR WITHOUT PRESENCE OF ULCERS (sore throat, sores in mouth, or a toothache) UNUSUAL RASH, SWELLING OR PAIN  UNUSUAL VAGINAL DISCHARGE OR ITCHING   Items with * indicate a potential emergency and should be followed up as soon as possible or go to the Emergency Department if any problems should occur.  Please show the CHEMOTHERAPY ALERT CARD or IMMUNOTHERAPY ALERT CARD at check-in to the Emergency Department  and triage nurse.  Should you have questions after your visit or need to cancel or reschedule your appointment, please contact [facility] [phone]  and follow the prompts.  Office hours are 8:00 a.m. to 4:30 p.m. Monday - Friday. Please note that voicemails left after 4:00 p.m. may not be returned until the following business day.  We are closed weekends and major holidays. You have access to a nurse at all times for urgent questions. Please call the main number to the clinic [phone] and follow the prompts.  For any non-urgent questions, you may also contact your provider using MyChart. We now offer e-Visits for anyone 46 and older to request care online for non-urgent symptoms. For details visit [email or web address].   Also download the MyChart app! Go to the app store, search "MyChart", open the app, select [facility], and log in with your MyChart username and password.  Due to Covid, a mask is required upon entering the hospital/clinic. If you do not have a mask, one will be given to you upon arrival. For doctor visits, patients may have 1 support person aged 3 or older with them. For treatment visits, patients cannot have anyone with them due to current Covid guidelines and our immunocompromised population.

## 2021-06-14 NOTE — Progress Notes (Signed)
Pt presents today for RCHOP per provider's order. Vital signs and labs WNL for treatment today. Okay to proceed with treatment per Dr.K  During administration of Rituximab pt's HR started to decrease into the upper 30's, pt's baseline HR is normally in the 50's. Pt is asymptomatic alert and oriented with no signs of distress Message sent to Dr. Raliegh Ip and he stated: Most likely from [place].  Every time he got Aloxi previously his heart rate came down to 40s.  If he is awake, alert and talking normal, may proceed with rituximab.  RCHOP given today per MD orders. Tolerated infusion without adverse affects. Vital signs stable. No complaints at this time. Discharged from clinic via wheelchair in stable condition. Alert and oriented x 3. F/U with [facility] as scheduled.

## 2021-06-16 ENCOUNTER — Other Ambulatory Visit: Payer: Self-pay

## 2021-06-16 ENCOUNTER — Ambulatory Visit (HOSPITAL_COMMUNITY): Payer: Medicare Other

## 2021-06-16 ENCOUNTER — Other Ambulatory Visit (HOSPITAL_COMMUNITY): Payer: Self-pay | Admitting: Physician Assistant

## 2021-06-16 ENCOUNTER — Inpatient Hospital Stay (HOSPITAL_COMMUNITY): Payer: Medicare Other

## 2021-06-16 VITALS — BP 136/68 | HR 95 | Temp 97.7°F | Resp 18

## 2021-06-16 DIAGNOSIS — Z5112 Encounter for antineoplastic immunotherapy: Secondary | ICD-10-CM | POA: Diagnosis not present

## 2021-06-16 DIAGNOSIS — C8338 Diffuse large B-cell lymphoma, lymph nodes of multiple sites: Secondary | ICD-10-CM

## 2021-06-16 DIAGNOSIS — E611 Iron deficiency: Secondary | ICD-10-CM

## 2021-06-16 MED ORDER — PEGFILGRASTIM-BMEZ 6 MG/0.6ML ~~LOC~~ SOSY
6.0000 mg | PREFILLED_SYRINGE | Freq: Once | SUBCUTANEOUS | Status: AC
  Administered 2021-06-16: 6 mg via SUBCUTANEOUS
  Filled 2021-06-16: qty 0.6

## 2021-06-16 NOTE — Progress Notes (Signed)
Patient presents today for Ziextenzo injection.  Patient is in satisfactory condition with no new complaints voiced.  Vital signs are stable.  We will proceed with injection per MD orders.   Patient tolerated injection with no complaints voiced.  Site clean and dry with no bruising or swelling noted.  No complaints of pain.  Discharged with vital signs stable and no signs or symptoms of distress noted.

## 2021-06-16 NOTE — Patient Instructions (Signed)
[facility]  Discharge Instructions: Thank you for choosing [facility] to provide your oncology and hematology care.  If you have a lab appointment with the Cancer Center, please come in thru the Main Entrance and check in at the main information desk.  Wear comfortable clothing and clothing appropriate for easy access to any Portacath or PICC line.   We strive to give you quality time with your provider. You may need to reschedule your appointment if you arrive late (15 or more minutes).  Arriving late affects you and other patients whose appointments are after yours.  Also, if you miss three or more appointments without notifying the office, you may be dismissed from the clinic at the provider's discretion.      For prescription refill requests, have your pharmacy contact our office and allow 72 hours for refills to be completed.        To help prevent nausea and vomiting after your treatment, we encourage you to take your nausea medication as directed.  BELOW ARE SYMPTOMS THAT SHOULD BE REPORTED IMMEDIATELY: *FEVER GREATER THAN 100.4 F (38 C) OR HIGHER *CHILLS OR SWEATING *NAUSEA AND VOMITING THAT IS NOT CONTROLLED WITH YOUR NAUSEA MEDICATION *UNUSUAL SHORTNESS OF BREATH *UNUSUAL BRUISING OR BLEEDING *URINARY PROBLEMS (pain or burning when urinating, or frequent urination) *BOWEL PROBLEMS (unusual diarrhea, constipation, pain near the anus) TENDERNESS IN MOUTH AND THROAT WITH OR WITHOUT PRESENCE OF ULCERS (sore throat, sores in mouth, or a toothache) UNUSUAL RASH, SWELLING OR PAIN  UNUSUAL VAGINAL DISCHARGE OR ITCHING   Items with * indicate a potential emergency and should be followed up as soon as possible or go to the Emergency Department if any problems should occur.  Please show the CHEMOTHERAPY ALERT CARD or IMMUNOTHERAPY ALERT CARD at check-in to the Emergency Department and triage nurse.  Should you have questions after your visit or need to cancel  or reschedule your appointment, please contact [facility] [phone]  and follow the prompts.  Office hours are 8:00 a.m. to 4:30 p.m. Monday - Friday. Please note that voicemails left after 4:00 p.m. may not be returned until the following business day.  We are closed weekends and major holidays. You have access to a nurse at all times for urgent questions. Please call the main number to the clinic [phone] and follow the prompts.  For any non-urgent questions, you may also contact your provider using MyChart. We now offer e-Visits for anyone 18 and older to request care online for non-urgent symptoms. For details visit [email or web address].   Also download the MyChart app! Go to the app store, search "MyChart", open the app, select [facility], and log in with your MyChart username and password.  Due to Covid, a mask is required upon entering the hospital/clinic. If you do not have a mask, one will be given to you upon arrival. For doctor visits, patients may have 1 support person aged 18 or older with them. For treatment visits, patients cannot have anyone with them due to current Covid guidelines and our immunocompromised population.  

## 2021-06-22 LAB — COLOGUARD: COLOGUARD: NEGATIVE

## 2021-06-26 ENCOUNTER — Ambulatory Visit
Admission: EM | Admit: 2021-06-26 | Discharge: 2021-06-26 | Disposition: A | Discharge status: Home / Self Care | Payer: Medicare Other | Attending: Family Medicine | Admitting: Family Medicine

## 2021-06-26 ENCOUNTER — Encounter (HOSPITAL_COMMUNITY): Payer: Self-pay | Admitting: Hematology

## 2021-06-26 ENCOUNTER — Other Ambulatory Visit: Payer: Self-pay

## 2021-06-26 DIAGNOSIS — J069 Acute upper respiratory infection, unspecified: Secondary | ICD-10-CM

## 2021-06-26 MED ORDER — BENZONATATE 200 MG PO CAPS: 200.0000 mg | ORAL_CAPSULE | Freq: Three times a day (TID) | ORAL | 0 refills | Status: DC | PRN

## 2021-06-26 MED ORDER — GUAIFENESIN ER 600 MG PO TB12: 600.0000 mg | ORAL_TABLET | Freq: Two times a day (BID) | ORAL | 0 refills | Status: DC | PRN

## 2021-06-26 NOTE — ED Triage Notes (Signed)
Patient states he is taking Chemo and he is experiencing some cough and congestion with mucus for 2 days  Patient states that his grandson had an ear infection and maybe strep throat last week and may have been exposed  Denies Fever

## 2021-06-26 NOTE — ED Provider Notes (Signed)
[facility]    CSN: [id number] Arrival date & time: 06/26/21  2841      History   Chief Complaint Chief Complaint  Patient presents with   Cough    Runny nose and cough   HPI Roy [name] is a 71 y.o. male.   Presenting today with 2-day history of deep cough, nasal congestion.  Denies fever, chills, sore throat, chest pain, shortness of breath, abdominal pain, nausea vomiting or diarrhea.  Has not tried anything over-the-counter for symptoms thus far.  States his grandson had an ear infection and throat infection last week and he was around him.  Wanting to make sure he does not have pneumonia as he is currently taking chemotherapy.  No known history of chronic pulmonary disease.  Past Medical History:  Diagnosis Date   Atrial fibrillation ([place])    GERD (gastroesophageal reflux disease)    High cholesterol    Hypertension    Obesity     Patient Active Problem List   Diagnosis Date Noted   DLBCL (diffuse large B cell lymphoma) ([place]) 03/22/2021   Subacute thyroiditis 01/20/2021   Adrenal insufficiency ([place]) 01/10/2021   Hyponatremia 01/08/2021   Weakness 01/08/2021   Hyperthyroidism 10/22/2020   Coagulopathy ([place]) 10/21/2020   Cellulitis 03/16/2018   Obesity (BMI 30-39.9) 05/04/2015   Chronic anticoagulation 05/04/2015   Trifascicular block 04/29/2015   LAFB (left anterior fascicular block) 04/29/2015   Essential hypertension    Hypokalemia 04/28/2015   RBBB 04/28/2015   First degree AV block 04/28/2015   PAF (paroxysmal atrial fibrillation) ([place]) 04/27/2015   Dizziness 04/27/2015    Past Surgical History:  Procedure Laterality Date   APPENDECTOMY     CATARACT EXTRACTION Bilateral 2022   CHOLECYSTECTOMY     ESOPHAGEAL DILATION     multiple times   IR IMAGING GUIDED PORT INSERTION  03/31/2021   JOINT REPLACEMENT Left    hip       Home Medications    Prior to Admission medications   Medication Sig Start Date End Date Taking? Authorizing  Provider  benzonatate (TESSALON) 200 MG capsule Take 1 capsule (200 mg total) by mouth 3 (three) times daily as needed for cough. 06/26/21  Yes Volney American, PA-C  guaiFENesin (MUCINEX) 600 MG 12 hr tablet Take 1 tablet (600 mg total) by mouth 2 (two) times daily as needed. 06/26/21  Yes Volney American, PA-C  acetaminophen (TYLENOL) 325 MG tablet Take 2 tablets (650 mg total) by mouth every 6 (six) hours as needed for headache. 01/13/21   Roxan Hockey, MD  allopurinol (ZYLOPRIM) 300 MG tablet Take 1 tablet (300 mg total) by mouth daily. 03/22/21   Derek Jack, MD  Calcium Carbonate-Vitamin D (CALCIUM-VITAMIN D) 600-125 MG-UNIT TABS Take by mouth.    [provider]  Cholecalciferol (VITAMIN D3) 25 MCG (1000 UT) CAPS Take by mouth daily.    [provider]  CYCLOPHOSPHAMIDE IV Inject into the vein every 21 ( twenty-one) days. 04/12/21   [provider]  DOXORUBICIN HCL IV Inject into the vein every 21 ( twenty-one) days. 04/12/21   [provider]  EQUATE STOOL SOFTENER 100 MG capsule Take 2 capsules by mouth twice daily 06/08/21   Tarri Abernethy M, PA-C  erythromycin ophthalmic ointment SMARTSIG:1 In Eye(s) Every Night 04/14/21   [provider]  ferrous sulfate 325 (65 FE) MG EC tablet Take 1 tablet by mouth once daily with breakfast 06/16/21   Harriett Rush, PA-C  fludrocortisone (FLORINEF) 0.1 MG tablet Take 1 tablet (0.1 mg total) by mouth daily with breakfast. 06/05/21   Nida, Marella Chimes, MD  gabapentin (NEURONTIN) 100 MG capsule Take 1 capsule (100 mg total) by mouth 2 (two) times daily. 03/22/21   Derek Jack, MD  Lactulose 20 GM/30ML SOLN Take 30 ml by mouth every 3 hours until a bowel movement occurs.  Then take 30 ml by mouth at bedtime. 03/22/21   Derek Jack, MD  levothyroxine (SYNTHROID) 88 MCG tablet Take 1 tablet (88 mcg total) by mouth daily before breakfast. 06/05/21   Nida,  Marella Chimes, MD  lidocaine (XYLOCAINE) 2 % jelly Apply to affected area daily prn 01/16/21 01/16/22  [provider]  lidocaine-prilocaine (EMLA) cream Apply a small amount to port a cath site and cover with plastic wrap 1 hour prior to chemotherapy appointments 04/07/21   Derek Jack, MD  magnesium oxide (MAG-OX) 400 (240 Mg) MG tablet Take 1 tablet (400 mg total) by mouth 2 (two) times daily. 10/24/20   [name], MD  midodrine (PROAMATINE) 5 MG tablet TAKE 2 TABLETS EACH MORNING, 1 TABLET EACH AFTERNOON AND 1 TABLET EACH EVENING. Patient taking differently: 5 mg 3 (three) times daily with meals. 05/15/21   Cassandria Anger, MD  omeprazole (PRILOSEC) 10 MG capsule Take 1 capsule by mouth daily. 11/15/20   [provider]  potassium chloride (KLOR-CON) 10 MEQ tablet Take 10 mEq by mouth 2 (two) times daily. 11/04/20   [provider]  prednisoLONE acetate (PRED FORTE) 1 % ophthalmic suspension Place into the left eye. 04/14/21   [provider]  predniSONE (DELTASONE) 10 MG tablet Take 1 tablet (10 mg total) by mouth daily with breakfast. 03/02/21   Nida, Marella Chimes, MD  predniSONE (DELTASONE) 20 MG tablet Take 100 mg (5 tablets) by mouth daily starting on each day of chemotherapy treatment, then 4 days after.  Repeat every 3 weeks. Do not take on the weeks you are not receiving treatment. 04/07/21   Derek Jack, MD  prochlorperazine (COMPAZINE) 10 MG tablet Take 1 tablet (10 mg total) by mouth every 6 (six) hours as needed (Nausea or vomiting). 04/07/21   Derek Jack, MD  psyllium (REGULOID) 0.52 g capsule Take by mouth. 01/16/21 01/16/22  [provider]  riTUXimab in sodium chloride 0.9 % 250 mL Inject into the vein every 21 ( twenty-one) days. 04/12/21   [provider]  rosuvastatin (CRESTOR) 5 MG tablet Take 1 tablet by mouth at bedtime. 08/23/20   [provider]  tamsulosin (FLOMAX) 0.4 MG CAPS capsule  Take 1 capsule by mouth daily. 02/28/21   [provider]  trifluridine (VIROPTIC) 1 % ophthalmic solution Place into the left eye. 04/14/21   [provider]  valACYclovir (VALTREX) 500 MG tablet Take 500 mg by mouth 2 (two) times daily. 04/14/21   [provider]  VINCRISTINE SULFATE IV Inject into the vein every 21 ( twenty-one) days. 04/12/21   [provider]  vitamin B-12 (CYANOCOBALAMIN) 1000 MCG tablet Take 1 tablet (1,000 mcg total) by mouth daily. 02/28/21   Harriett Rush, PA-C  warfarin (COUMADIN) 5 MG tablet Take 1 tablet (5 mg total) by mouth every evening. 01/15/21   Roxan Hockey, MD    Family History Family History  Problem Relation Age of Onset   Heart failure Father    Heart attack Father        Deceased    Thyroid disease Sister  Social History Social History   Tobacco Use   Smoking status: Never   Smokeless tobacco: Never  Vaping Use   Vaping Use: Never used  Substance Use Topics   Alcohol use: No   Drug use: No     Allergies   Adhesive [tape] and Sulfa antibiotics   Review of Systems Review of Systems Per HPI  Physical Exam Triage Vital Signs ED Triage Vitals  Enc Vitals Group     BP 06/26/21 1226 129/90     Pulse Rate 06/26/21 1226 86     Resp 06/26/21 1226 18     Temp 06/26/21 1226 97.7 F (36.5 C)     Temp Source 06/26/21 1226 Oral     SpO2 06/26/21 1226 99 %     Weight --      Height --      Head Circumference --      Peak Flow --      Pain Score 06/26/21 1224 0     Pain Loc --      Pain Edu? --      Excl. in [place]? --    No data found.  Updated Vital Signs BP 129/90 (BP Location: Right Arm)    Pulse 86    Temp 97.7 F (36.5 C) (Oral)    Resp 18    SpO2 99%   Visual Acuity Right Eye Distance:   Left Eye Distance:   Bilateral Distance:    Right Eye Near:   Left Eye Near:    Bilateral Near:     Physical Exam Vitals and nursing note reviewed.  Constitutional:      Appearance:  Normal appearance. He is well-developed.  HENT:     Head: Atraumatic.     Right Ear: External ear normal.     Left Ear: External ear normal.     Nose: Rhinorrhea present.     Mouth/Throat:     Mouth: Mucous membranes are moist.     Pharynx: Posterior oropharyngeal erythema present. No oropharyngeal exudate.  Eyes:     Extraocular Movements: Extraocular movements intact.     Conjunctiva/sclera: Conjunctivae normal.     Pupils: Pupils are equal, round, and reactive to light.  Cardiovascular:     Rate and Rhythm: Normal rate and regular rhythm.  Pulmonary:     Effort: Pulmonary effort is normal. No respiratory distress.     Breath sounds: Normal breath sounds. No wheezing or rales.  Musculoskeletal:        General: Normal range of motion.     Cervical back: Normal range of motion and neck supple.  Lymphadenopathy:     Cervical: No cervical adenopathy.  Skin:    General: Skin is warm and dry.  Neurological:     General: No focal deficit present.     Mental Status: He is alert and oriented to person, place, and time.     Motor: No weakness.     Gait: Gait normal.  Psychiatric:        Mood and Affect: Mood normal.        Behavior: Behavior normal.        Thought Content: Thought content normal.        Judgment: Judgment normal.     UC Treatments / Results  Labs (all labs ordered are listed, but only abnormal results are displayed) Labs Reviewed  COVID-19, FLU A+B NAA    EKG   Radiology No results found.  Procedures Procedures (including critical care  time)  Medications Ordered in UC Medications - No data to display  Initial Impression / Assessment and Plan / UC Course  I have reviewed the triage vital signs and the nursing notes.  Pertinent labs & imaging results that were available during my care of the patient were reviewed by me and considered in my medical decision making (see chart for details).     Vitals and exam overall reassuring, suspect viral  illness.  COVID and flu testing pending.  We will treat with Mucinex, Tessalon Perles, supportive over-the-counter home care.  Return for acutely worsening symptoms.  Final Clinical Impressions(s) / UC Diagnoses   Final diagnoses:  Viral URI with cough   Discharge Instructions   None    ED Prescriptions     Medication Sig Dispense Auth. Provider   benzonatate (TESSALON) 200 MG capsule Take 1 capsule (200 mg total) by mouth 3 (three) times daily as needed for cough. 20 capsule Volney American, PA-C   guaiFENesin (MUCINEX) 600 MG 12 hr tablet Take 1 tablet (600 mg total) by mouth 2 (two) times daily as needed. 30 tablet Volney American, [place]      PDMP not reviewed this encounter.   Volney American, [place] 06/26/21 1339

## 2021-06-27 LAB — COVID-19, FLU A+B NAA
Influenza A, NAA: NOT DETECTED
Influenza B, NAA: NOT DETECTED
SARS-CoV-2, NAA: NOT DETECTED

## 2021-07-05 ENCOUNTER — Inpatient Hospital Stay (HOSPITAL_COMMUNITY): Payer: Medicare Other | Attending: Hematology

## 2021-07-05 ENCOUNTER — Other Ambulatory Visit: Payer: Self-pay

## 2021-07-05 ENCOUNTER — Inpatient Hospital Stay (HOSPITAL_BASED_OUTPATIENT_CLINIC_OR_DEPARTMENT_OTHER): Payer: Medicare Other | Admitting: Hematology

## 2021-07-05 ENCOUNTER — Inpatient Hospital Stay (HOSPITAL_COMMUNITY): Payer: Medicare Other

## 2021-07-05 VITALS — BP 118/63 | HR 54 | Temp 97.1°F | Resp 18

## 2021-07-05 VITALS — BP 138/79 | HR 87 | Temp 96.9°F | Resp 19 | Ht 68.7 in | Wt 237.9 lb

## 2021-07-05 DIAGNOSIS — D696 Thrombocytopenia, unspecified: Secondary | ICD-10-CM | POA: Diagnosis not present

## 2021-07-05 DIAGNOSIS — C8338 Diffuse large B-cell lymphoma, lymph nodes of multiple sites: Secondary | ICD-10-CM | POA: Diagnosis not present

## 2021-07-05 DIAGNOSIS — I4891 Unspecified atrial fibrillation: Secondary | ICD-10-CM | POA: Insufficient documentation

## 2021-07-05 DIAGNOSIS — R5383 Other fatigue: Secondary | ICD-10-CM | POA: Insufficient documentation

## 2021-07-05 DIAGNOSIS — Z5112 Encounter for antineoplastic immunotherapy: Secondary | ICD-10-CM | POA: Diagnosis not present

## 2021-07-05 DIAGNOSIS — G629 Polyneuropathy, unspecified: Secondary | ICD-10-CM | POA: Insufficient documentation

## 2021-07-05 DIAGNOSIS — Z5189 Encounter for other specified aftercare: Secondary | ICD-10-CM | POA: Diagnosis not present

## 2021-07-05 DIAGNOSIS — E790 Hyperuricemia without signs of inflammatory arthritis and tophaceous disease: Secondary | ICD-10-CM | POA: Insufficient documentation

## 2021-07-05 DIAGNOSIS — D72819 Decreased white blood cell count, unspecified: Secondary | ICD-10-CM | POA: Diagnosis not present

## 2021-07-05 DIAGNOSIS — I10 Essential (primary) hypertension: Secondary | ICD-10-CM | POA: Insufficient documentation

## 2021-07-05 DIAGNOSIS — Z5111 Encounter for antineoplastic chemotherapy: Secondary | ICD-10-CM | POA: Insufficient documentation

## 2021-07-05 DIAGNOSIS — R350 Frequency of micturition: Secondary | ICD-10-CM | POA: Insufficient documentation

## 2021-07-05 DIAGNOSIS — C8330 Diffuse large B-cell lymphoma, unspecified site: Secondary | ICD-10-CM | POA: Diagnosis present

## 2021-07-05 DIAGNOSIS — Z79899 Other long term (current) drug therapy: Secondary | ICD-10-CM | POA: Diagnosis not present

## 2021-07-05 DIAGNOSIS — Z7901 Long term (current) use of anticoagulants: Secondary | ICD-10-CM | POA: Diagnosis not present

## 2021-07-05 DIAGNOSIS — Z7952 Long term (current) use of systemic steroids: Secondary | ICD-10-CM | POA: Diagnosis not present

## 2021-07-05 DIAGNOSIS — R634 Abnormal weight loss: Secondary | ICD-10-CM | POA: Diagnosis not present

## 2021-07-05 LAB — CBC WITH DIFFERENTIAL/PLATELET
Abs Immature Granulocytes: 0.61 10*3/uL — ABNORMAL HIGH (ref 0.00–0.07)
Basophils Absolute: 0.1 10*3/uL (ref 0.0–0.1)
Basophils Relative: 1 %
Eosinophils Absolute: 0 10*3/uL (ref 0.0–0.5)
Eosinophils Relative: 0 %
HCT: 41.4 % (ref 39.0–52.0)
Hemoglobin: 13.4 g/dL (ref 13.0–17.0)
Immature Granulocytes: 6 %
Lymphocytes Relative: 8 %
Lymphs Abs: 0.8 10*3/uL (ref 0.7–4.0)
MCH: 31.5 pg (ref 26.0–34.0)
MCHC: 32.4 g/dL (ref 30.0–36.0)
MCV: 97.4 fL (ref 80.0–100.0)
Monocytes Absolute: 0.2 10*3/uL (ref 0.1–1.0)
Monocytes Relative: 2 %
Neutro Abs: 8.1 10*3/uL — ABNORMAL HIGH (ref 1.7–7.7)
Neutrophils Relative %: 83 %
Platelets: 285 10*3/uL (ref 150–400)
RBC: 4.25 MIL/uL (ref 4.22–5.81)
RDW: 15.9 % — ABNORMAL HIGH (ref 11.5–15.5)
WBC: 9.8 10*3/uL (ref 4.0–10.5)
nRBC: 0 % (ref 0.0–0.2)

## 2021-07-05 LAB — COMPREHENSIVE METABOLIC PANEL
ALT: 19 U/L (ref 0–44)
AST: 30 U/L (ref 15–41)
Albumin: 4 g/dL (ref 3.5–5.0)
Alkaline Phosphatase: 74 U/L (ref 38–126)
Anion gap: 9 (ref 5–15)
BUN: 15 mg/dL (ref 8–23)
CO2: 30 mmol/L (ref 22–32)
Calcium: 9.3 mg/dL (ref 8.9–10.3)
Chloride: 97 mmol/L — ABNORMAL LOW (ref 98–111)
Creatinine, Ser: 0.68 mg/dL (ref 0.61–1.24)
GFR, Estimated: >60 mL/min (ref >60)
Glucose, Bld: 117 mg/dL — ABNORMAL HIGH (ref 70–99)
Potassium: 3.8 mmol/L (ref 3.5–5.1)
Sodium: 136 mmol/L (ref 135–145)
Total Bilirubin: 0.5 mg/dL (ref 0.3–1.2)
Total Protein: 7.1 g/dL (ref 6.5–8.1)

## 2021-07-05 LAB — URIC ACID: Uric Acid, Serum: 4.1 mg/dL (ref 3.7–8.6)

## 2021-07-05 LAB — MAGNESIUM: Magnesium: 2.2 mg/dL (ref 1.7–2.4)

## 2021-07-05 LAB — LACTATE DEHYDROGENASE: LDH: 254 U/L — ABNORMAL HIGH (ref 98–192)

## 2021-07-05 MED ORDER — DOXORUBICIN HCL CHEMO IV INJECTION 2 MG/ML
50.0000 mg/m2 | Freq: Once | INTRAVENOUS | Status: AC
  Administered 2021-07-05: 116 mg via INTRAVENOUS
  Filled 2021-07-05: qty 58

## 2021-07-05 MED ORDER — SODIUM CHLORIDE 0.9 % IV SOLN
750.0000 mg/m2 | Freq: Once | INTRAVENOUS | Status: AC
  Administered 2021-07-05: 1740 mg via INTRAVENOUS
  Filled 2021-07-05: qty 87

## 2021-07-05 MED ORDER — SODIUM CHLORIDE 0.9 % IV SOLN
375.0000 mg/m2 | Freq: Once | INTRAVENOUS | Status: AC
  Administered 2021-07-05: 900 mg via INTRAVENOUS
  Filled 2021-07-05: qty 50

## 2021-07-05 MED ORDER — DIPHENHYDRAMINE HCL 25 MG PO CAPS
50.0000 mg | ORAL_CAPSULE | Freq: Once | ORAL | Status: AC
  Administered 2021-07-05: 50 mg via ORAL
  Filled 2021-07-05: qty 2

## 2021-07-05 MED ORDER — HEPARIN SOD (PORK) LOCK FLUSH 100 UNIT/ML IV SOLN
500.0000 [IU] | Freq: Once | INTRAVENOUS | Status: AC | PRN
  Administered 2021-07-05: 500 [IU]

## 2021-07-05 MED ORDER — GABAPENTIN 300 MG PO CAPS: 300.0000 mg | ORAL_CAPSULE | Freq: Two times a day (BID) | ORAL | 6 refills | Status: DC

## 2021-07-05 MED ORDER — SODIUM CHLORIDE 0.9% FLUSH
10.0000 mL | INTRAVENOUS | Status: DC | PRN
  Administered 2021-07-05: 10 mL

## 2021-07-05 MED ORDER — SODIUM CHLORIDE 0.9 % IV SOLN
150.0000 mg | Freq: Once | INTRAVENOUS | Status: AC
  Administered 2021-07-05: 150 mg via INTRAVENOUS
  Filled 2021-07-05: qty 150

## 2021-07-05 MED ORDER — VINCRISTINE SULFATE CHEMO INJECTION 1 MG/ML
1.0000 mg | Freq: Once | INTRAVENOUS | Status: AC
  Administered 2021-07-05: 1 mg via INTRAVENOUS
  Filled 2021-07-05: qty 1

## 2021-07-05 MED ORDER — PALONOSETRON HCL INJECTION 0.25 MG/5ML
0.2500 mg | Freq: Once | INTRAVENOUS | Status: AC
  Administered 2021-07-05: 0.25 mg via INTRAVENOUS
  Filled 2021-07-05: qty 5

## 2021-07-05 MED ORDER — ACETAMINOPHEN 325 MG PO TABS
650.0000 mg | ORAL_TABLET | Freq: Once | ORAL | Status: AC
  Administered 2021-07-05: 650 mg via ORAL
  Filled 2021-07-05: qty 2

## 2021-07-05 MED ORDER — SODIUM CHLORIDE 0.9 % IV SOLN
10.0000 mg | Freq: Once | INTRAVENOUS | Status: AC
  Administered 2021-07-05: 10 mg via INTRAVENOUS
  Filled 2021-07-05: qty 10

## 2021-07-05 MED ORDER — SODIUM CHLORIDE 0.9 % IV SOLN: Freq: Once | INTRAVENOUS | Status: AC

## 2021-07-05 MED ORDER — FAMOTIDINE IN NACL 20-0.9 MG/50ML-% IV SOLN
20.0000 mg | Freq: Once | INTRAVENOUS | Status: AC
  Administered 2021-07-05: 20 mg via INTRAVENOUS
  Filled 2021-07-05: qty 50

## 2021-07-05 NOTE — Progress Notes (Signed)
Patient has been examined by Dr. Katragadda, and vital signs and labs have been reviewed. ANC, Creatinine, LFTs, hemoglobin, and platelets are within treatment parameters per M.D. - pt may proceed with treatment.    °

## 2021-07-05 NOTE — Patient Instructions (Signed)
[facility]  Discharge Instructions: Thank you for choosing [facility] to provide your oncology and hematology care.  If you have a lab appointment with the Cancer Center, please come in thru the Main Entrance and check in at the main information desk.  Wear comfortable clothing and clothing appropriate for easy access to any Portacath or PICC line.   We strive to give you quality time with your provider. You may need to reschedule your appointment if you arrive late (15 or more minutes).  Arriving late affects you and other patients whose appointments are after yours.  Also, if you miss three or more appointments without notifying the office, you may be dismissed from the clinic at the provider's discretion.      For prescription refill requests, have your pharmacy contact our office and allow 72 hours for refills to be completed.        To help prevent nausea and vomiting after your treatment, we encourage you to take your nausea medication as directed.  BELOW ARE SYMPTOMS THAT SHOULD BE REPORTED IMMEDIATELY: *FEVER GREATER THAN 100.4 F (38 C) OR HIGHER *CHILLS OR SWEATING *NAUSEA AND VOMITING THAT IS NOT CONTROLLED WITH YOUR NAUSEA MEDICATION *UNUSUAL SHORTNESS OF BREATH *UNUSUAL BRUISING OR BLEEDING *URINARY PROBLEMS (pain or burning when urinating, or frequent urination) *BOWEL PROBLEMS (unusual diarrhea, constipation, pain near the anus) TENDERNESS IN MOUTH AND THROAT WITH OR WITHOUT PRESENCE OF ULCERS (sore throat, sores in mouth, or a toothache) UNUSUAL RASH, SWELLING OR PAIN  UNUSUAL VAGINAL DISCHARGE OR ITCHING   Items with * indicate a potential emergency and should be followed up as soon as possible or go to the Emergency Department if any problems should occur.  Please show the CHEMOTHERAPY ALERT CARD or IMMUNOTHERAPY ALERT CARD at check-in to the Emergency Department and triage nurse.  Should you have questions after your visit or need to cancel  or reschedule your appointment, please contact [facility] [phone]  and follow the prompts.  Office hours are 8:00 a.m. to 4:30 p.m. Monday - Friday. Please note that voicemails left after 4:00 p.m. may not be returned until the following business day.  We are closed weekends and major holidays. You have access to a nurse at all times for urgent questions. Please call the main number to the clinic [phone] and follow the prompts.  For any non-urgent questions, you may also contact your provider using MyChart. We now offer e-Visits for anyone 18 and older to request care online for non-urgent symptoms. For details visit [email or web address].   Also download the MyChart app! Go to the app store, search "MyChart", open the app, select [facility], and log in with your MyChart username and password.  Due to Covid, a mask is required upon entering the hospital/clinic. If you do not have a mask, one will be given to you upon arrival. For doctor visits, patients may have 1 support person aged 18 or older with them. For treatment visits, patients cannot have anyone with them due to current Covid guidelines and our immunocompromised population.  

## 2021-07-05 NOTE — Progress Notes (Signed)
Roy [name], [place] [address]   CLINIC:  Medical Oncology/Hematology  PCP:  Bridget Hartshorn, NP [address] [place] [address] [phone]   REASON FOR VISIT:  Follow-up for diffuse large B-cell lymphoma  PRIOR THERAPY: none  NGS Results: not done  CURRENT THERAPY:  R-CHOP every 3 weeks  BRIEF ONCOLOGIC HISTORY:  Oncology History  DLBCL (diffuse large B cell lymphoma) ([place])  03/22/2021 Initial Diagnosis   DLBCL (diffuse large B cell lymphoma) ([place])   03/22/2021 Cancer Staging   Staging form: Hodgkin and Non-Hodgkin Lymphoma, AJCC 8th Edition - Clinical stage from 03/22/2021: Stage IV (Diffuse large B-cell lymphoma) - Signed by Derek Jack, MD on 03/22/2021    04/12/2021 -  Chemotherapy   Patient is on Treatment Plan : NON-HODGKINS LYMPHOMA R-CHOP q21d       CANCER STAGING:  Cancer Staging  DLBCL (diffuse large B cell lymphoma) ([place]) Staging form: Hodgkin and Non-Hodgkin Lymphoma, AJCC 8th Edition - Clinical stage from 03/22/2021: Stage IV (Diffuse large B-cell lymphoma) - Signed by Derek Jack, MD on 03/22/2021   INTERVAL HISTORY:  Mr. SUN WILENSKY, a 71 y.o. male, returns for routine follow-up and consideration for next cycle of chemotherapy. Nikos was last seen on 06/14/2021.  Due for cycle #5 of  R-CHOP today.   Overall, he tells me he has been feeling pretty well. He denies n/v/d. His cough has improved, and his eating has improved. He reports continues numbness in and stinging in his left foot which is worst at night and has not been helped by Gabapentin BID. He reports difficulty with balance while lifting objects such as a bag of groceries.   Overall, he feels ready for next cycle of chemo today.   REVIEW OF SYSTEMS:  Review of Systems  Constitutional:  Negative for appetite change (improved) and fatigue.  Respiratory:  Positive for cough (improved).    Gastrointestinal:  Negative for diarrhea, nausea and vomiting.  Musculoskeletal:  Positive for arthralgias (7/10 L foot).  Neurological:  Positive for dizziness (off balance), headaches and numbness (L foot).  All other systems reviewed and are negative.  PAST MEDICAL/SURGICAL HISTORY:  Past Medical History:  Diagnosis Date   Atrial fibrillation (HCC)    GERD (gastroesophageal reflux disease)    High cholesterol    Hypertension    Obesity    Past Surgical History:  Procedure Laterality Date   APPENDECTOMY     CATARACT EXTRACTION Bilateral 2022   CHOLECYSTECTOMY     ESOPHAGEAL DILATION     multiple times   IR IMAGING GUIDED PORT INSERTION  03/31/2021   JOINT REPLACEMENT Left    hip    SOCIAL HISTORY:  Social History   Socioeconomic History   Marital status: Widowed    Spouse name: Not on file   Number of children: 3   Years of education: Not on file   Highest education level: Not on file  Occupational History   Occupation: Retired  Tobacco Use   Smoking status: Never   Smokeless tobacco: Never  Vaping Use   Vaping Use: Never used  Substance and Sexual Activity   Alcohol use: No   Drug use: No   Sexual activity: Not Currently  Other Topics Concern   Not on file  Social History Narrative   Not on file   Social Determinants of Health   Financial Resource Strain: Low Risk    Difficulty of Paying Living Expenses:  Not hard at all  Food Insecurity: No Food Insecurity   Worried About Charity fundraiser in the Last Year: Never true   Ran Out of Food in the Last Year: Never true  Transportation Needs: No Transportation Needs   Lack of Transportation (Medical): No   Lack of Transportation (Non-Medical): No  Physical Activity: Sufficiently Active   Days of Exercise per Week: 5 days   Minutes of Exercise per Session: 30 min  Stress: No Stress Concern Present   Feeling of Stress : Not at all  Social Connections: Moderately Isolated   Frequency of Communication  with Friends and Family: More than three times a week   Frequency of Social Gatherings with Friends and Family: Three times a week   Attends Religious Services: 1 to 4 times per year   Active Member of Clubs or Organizations: No   Attends Archivist Meetings: Never   Marital Status: Widowed  Human resources officer Violence: Not At Risk   Fear of Current or Ex-Partner: No   Emotionally Abused: No   Physically Abused: No   Sexually Abused: No    FAMILY HISTORY:  Family History  Problem Relation Age of Onset   Heart failure Father    Heart attack Father        Deceased    Thyroid disease Sister     CURRENT MEDICATIONS:  Current Outpatient Medications  Medication Sig Dispense Refill   acetaminophen (TYLENOL) 325 MG tablet Take 2 tablets (650 mg total) by mouth every 6 (six) hours as needed for headache. 30 tablet 1   benzonatate (TESSALON) 200 MG capsule Take 1 capsule (200 mg total) by mouth 3 (three) times daily as needed for cough. 20 capsule 0   Calcium Carbonate-Vitamin D (CALCIUM-VITAMIN D) 600-125 MG-UNIT TABS Take by mouth.     Cholecalciferol (VITAMIN D3) 25 MCG (1000 UT) CAPS Take by mouth daily.     CYCLOPHOSPHAMIDE IV Inject into the vein every 21 ( twenty-one) days.     DOXORUBICIN HCL IV Inject into the vein every 21 ( twenty-one) days.     EQUATE STOOL SOFTENER 100 MG capsule Take 2 capsules by mouth twice daily 120 capsule 0   erythromycin ophthalmic ointment SMARTSIG:1 In Eye(s) Every Night     ferrous sulfate 325 (65 FE) MG EC tablet Take 1 tablet by mouth once daily with breakfast 30 tablet 0   fludrocortisone (FLORINEF) 0.1 MG tablet Take 1 tablet (0.1 mg total) by mouth daily with breakfast. 90 tablet 1   guaiFENesin (MUCINEX) 600 MG 12 hr tablet Take 1 tablet (600 mg total) by mouth 2 (two) times daily as needed. 30 tablet 0   Lactulose 20 GM/30ML SOLN Take 30 ml by mouth every 3 hours until a bowel movement occurs.  Then take 30 ml by mouth at bedtime. 450  mL 2   levothyroxine (SYNTHROID) 88 MCG tablet Take 1 tablet (88 mcg total) by mouth daily before breakfast. 90 tablet 1   magnesium oxide (MAG-OX) 400 (240 Mg) MG tablet Take 1 tablet (400 mg total) by mouth 2 (two) times daily. 60 tablet 1   midodrine (PROAMATINE) 5 MG tablet TAKE 2 TABLETS EACH MORNING, 1 TABLET EACH AFTERNOON AND 1 TABLET EACH EVENING. (Patient taking differently: 5 mg 3 (three) times daily with meals.) 360 tablet 0   omeprazole (PRILOSEC) 10 MG capsule Take 1 capsule by mouth daily.     potassium chloride (KLOR-CON) 10 MEQ tablet Take 10 mEq by mouth  2 (two) times daily.     prednisoLONE acetate (PRED FORTE) 1 % ophthalmic suspension Place into the left eye.     predniSONE (DELTASONE) 10 MG tablet Take 1 tablet (10 mg total) by mouth daily with breakfast. 95 tablet 1   predniSONE (DELTASONE) 20 MG tablet Take 100 mg (5 tablets) by mouth daily starting on each day of chemotherapy treatment, then 4 days after.  Repeat every 3 weeks. Do not take on the weeks you are not receiving treatment. 150 tablet 0   psyllium (REGULOID) 0.52 g capsule Take by mouth.     riTUXimab in sodium chloride 0.9 % 250 mL Inject into the vein every 21 ( twenty-one) days.     rosuvastatin (CRESTOR) 5 MG tablet Take 1 tablet by mouth at bedtime.     tamsulosin (FLOMAX) 0.4 MG CAPS capsule Take 1 capsule by mouth daily.     trifluridine (VIROPTIC) 1 % ophthalmic solution Place into the left eye.     valACYclovir (VALTREX) 500 MG tablet Take 500 mg by mouth 2 (two) times daily.     VINCRISTINE SULFATE IV Inject into the vein every 21 ( twenty-one) days.     vitamin B-12 (CYANOCOBALAMIN) 1000 MCG tablet Take 1 tablet (1,000 mcg total) by mouth daily. 30 tablet 3   warfarin (COUMADIN) 5 MG tablet Take 1 tablet (5 mg total) by mouth every evening.     gabapentin (NEURONTIN) 300 MG capsule Take 1 capsule (300 mg total) by mouth 2 (two) times daily. 60 capsule 6   lidocaine (XYLOCAINE) 2 % jelly Apply to  affected area daily prn (Patient not taking: Reported on 07/05/2021)     lidocaine-prilocaine (EMLA) cream Apply a small amount to port a cath site and cover with plastic wrap 1 hour prior to chemotherapy appointments (Patient not taking: Reported on 07/05/2021) 30 g 3   prochlorperazine (COMPAZINE) 10 MG tablet Take 1 tablet (10 mg total) by mouth every 6 (six) hours as needed (Nausea or vomiting). (Patient not taking: Reported on 07/05/2021) 30 tablet 6   No current facility-administered medications for this visit.    ALLERGIES:  Allergies  Allergen Reactions   Adhesive [Tape]     Contact dermatitis   Sulfa Antibiotics     UNKNOWN Childhood reaction    PHYSICAL EXAM:  Performance status (ECOG): 0 - Asymptomatic  Vitals:   07/05/21 0756  BP: 138/79  Pulse: 87  Resp: 19  Temp: (!) 96.9 F (36.1 C)  SpO2: 100%   Wt Readings from Last 3 Encounters:  07/05/21 237 lb 14.4 oz (107.9 kg)  06/14/21 239 lb (108.4 kg)  06/05/21 233 lb 9.6 oz (106 kg)   Physical Exam Vitals reviewed.  Constitutional:      Appearance: Normal appearance. He is obese.  Cardiovascular:     Rate and Rhythm: Normal rate and regular rhythm.     Pulses: Normal pulses.     Heart sounds: Normal heart sounds.  Pulmonary:     Effort: Pulmonary effort is normal.     Breath sounds: Normal breath sounds.  Musculoskeletal:     Right lower leg: No edema.     Left lower leg: No edema.  Neurological:     General: No focal deficit present.     Mental Status: He is alert and oriented to person, place, and time.  Psychiatric:        Mood and Affect: Mood normal.        Behavior: Behavior normal.  LABORATORY DATA:  I have reviewed the labs as listed.  CBC Latest Ref Rng & Units 06/14/2021 05/24/2021 05/12/2021  WBC 4.0 - 10.5 K/uL 13.9(H) 10.9(H) 1.9(L)  Hemoglobin 13.0 - 17.0 g/dL 12.0(L) 11.9(L) 10.3(L)  Hematocrit 39.0 - 52.0 % 37.1(L) 36.8(L) 31.9(L)  Platelets 150 - 400 K/uL 249 379 128(L)   CMP Latest Ref  Rng & Units 06/14/2021 05/30/2021 05/24/2021  Glucose 70 - 99 mg/dL 93 105(H) 103(H)  BUN 8 - 23 mg/dL _0 Creatinine 0.61 - 1.24 mg/dL 0.69 0.75(L) 0.72  Sodium 135 - 145 mmol/L 137 140 136  Potassium 3.5 - 5.1 mmol/L 3.9 4.0 4.3  Chloride 98 - 111 mmol/L 99 97 97(L)  CO2 22 - 32 mmol/L _1 Calcium 8.9 - 10.3 mg/dL 9.1 9.7 9.1  Total Protein 6.5 - 8.1 g/dL 6.6 6.2 6.6  Total Bilirubin 0.3 - 1.2 mg/dL 0.9 0.6 0.7  Alkaline Phos 38 - 126 U/L 65 112 66  AST 15 - 41 U/L _2 ALT 0 - 44 U/L _3 DIAGNOSTIC IMAGING:  I have independently reviewed the scans and discussed with the patient. NM PET Image Restag (PS) Skull Base To Thigh  Result Date: 06/10/2021 CLINICAL DATA:  Subsequent treatment strategy for lymphoma. Recent chemotherapy. EXAM: NUCLEAR MEDICINE PET SKULL BASE TO THIGH TECHNIQUE: 12.64 mCi F-18 FDG was injected intravenously. Full-ring PET imaging was performed from the skull base to thigh after the radiotracer. CT data was obtained and used for attenuation correction and anatomic localization. Fasting blood glucose: 78 mg/dl COMPARISON:  CT neck 03/28/2021, PET-CT 03/16/2021 and abdominopelvic CT 10/21/2020 FINDINGS: Mediastinal blood pool activity: SUV max 2.4 Liver activity: SUV max 3.5 NECK: No hypermetabolic cervical lymph nodes are identified.There are no lesions of the pharyngeal mucosal space. Hypermetabolic parotid nodules are again noted bilaterally. The largest right-sided nodule measures 1.5 x 0.7 cm on image 33/3 and has an SUV max of 12.0 (previously 6.9). Left-sided nodule involving the deep lobe measures 1.7 x 0.9 cm on image 22/3 and has an SUV max of 10.0 (previously 8.8). These nodules have been evaluated by neck CT. Incidental CT findings: Bilateral carotid atherosclerosis. CHEST: There are no hypermetabolic mediastinal, hilar or axillary lymph nodes. No hypermetabolic pulmonary activity or suspicious nodularity. Incidental CT findings: The  previously demonstrated hypermetabolic ground-glass opacities throughout both lungs have resolved. Right IJ Port-A-Cath extends to the mid SVC. Mild aortic and coronary artery atherosclerosis with calcifications of the aortic valve. ABDOMEN/PELVIS: Interval resolution of previously demonstrated hypermetabolic splenic activity and splenomegaly. The splenic maximum SUV currently is 2.7 (previously 9.9. No hypermetabolic activity within the liver, adrenal glands or pancreas. There is no hypermetabolic nodal activity. Incidental CT findings: Previous cholecystectomy. Mild aortic and branch vessel atherosclerosis. SKELETON: There is no residual focal hypermetabolic activity to suggest osseous metastatic disease. Incidental CT findings: Multilevel lumbar spondylosis. The sacroiliac joints are ankylosed. Previous left total hip arthroplasty. IMPRESSION: 1. The previously demonstrated bilateral parotid lesions have not grossly changed in size, although their metabolic activity has increased compared with the prior study. Given improvement in the other activity related to the patient's lymphoma, these are likely unrelated parotid tumors as described on prior neck CT. Continued follow-up recommended. 2. No residual hypermetabolic activity within the spleen or bone marrow. No hypermetabolic nodal activity. [place] 1. 3. Previously demonstrated hypermetabolic ground-glass opacities throughout both lungs have resolved. Electronically Signed   By: Richardean Sale M.D.   On:  06/10/2021 12:11     ASSESSMENT:  1.  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  High-grade lymphoma panel was negative. - Weight loss of 40 pounds in the last 4 months, stable weight over the last 1 month.  No fevers or night sweats.  - Reviewed PET CT scan from 03/16/2021 which showed  hypermetabolic enlarged spleen.  There are hypermetabolic neck lymph nodes.  Diffuse hypermetabolic activity within the marrow space of the spine.  Diffuse groundglass density within the lungs with moderate activity.    2.  Social/family history: - He lives at home by himself.  His son is present today with him. - He is independent of ADLs and IADLs.  Son lives within 30 minutes.  He was never smoker. - He has work-related exposure to dyes.  No pesticide exposure. - Brother died of bone cancer.  Another brother also died of bone cancer.   PLAN:  1.  Diffuse large B-cell lymphoma, stage IVb: - PET scan on 06/08/2021 after 3 cycles showed no residual hypermetabolic activity within the spleen or bone marrow.  Previously hypermetabolic groundglass opacities in both lungs resolved. - He has tolerated cycle 4 very well without any major GI side effects.  However he reports neuropathy is worse. - Reviewed labs today which showed normal LFTs.  LDH is 254.  CBC was normal. - We will proceed with cycle 5 today.  We will decrease vincristine to 1 mg flat. - RTC 3 weeks for follow-up.    2.  Hyperuricemia: - Uric acid is normal.  Discontinue allopurinol.    3.  Peripheral neuropathy: - He reports neuropathy is worse in the feet at nighttime. - I have sent a prescription for gabapentin 300 mg twice daily.  Vincristine dose is reduced.  4.  High risk drug monitoring: - Echo on 01/09/2021 with EF 55-60%.  No signs of PND or orthopnea.  5.  Parotid masses: - PET scan showed bilateral parotid lesions which will be evaluated after lymphoma treatment done.   Orders placed this encounter:  No orders of the defined types were placed in this encounter.    Derek Jack, MD [facility] [phone]   I, Thana Ates, am acting as a scribe for Dr. Derek Jack.  I, Derek Jack MD, have reviewed the above documentation for accuracy and completeness, and I agree with  the above.

## 2021-07-05 NOTE — Progress Notes (Signed)
Labs reviewed by MD. Madaline Brilliant to proceed without the differential on the CBC per MD.  ? ?Treatment given per orders. Patient tolerated it well without problems. Vitals stable and discharged home from clinic ambulatory. Follow up as scheduled. ? ?

## 2021-07-05 NOTE — Patient Instructions (Signed)
[place] at [facility] ?Discharge Instructions ? ? ?You were seen and examined today by Dr. Delton Coombes. ? ?He reviewed your lab work which is normal/stable. ? ?He increased the dose of your gabapentin.  Continue to take as prescribed twice a day. A new prescription was sent to your pharmacy. ? ?You may STOP taking allopurinol.  ? ?Return as scheduled in 3 weeks. ? ? ?Thank you for choosing [place] at [facility] to provide your oncology and hematology care.  To afford each patient quality time with our provider, please arrive at least 15 minutes before your scheduled appointment time.  ? ?If you have a lab appointment with the [facility] please come in thru the Main Entrance and check in at the main information desk. ? ?You need to re-schedule your appointment should you arrive 10 or more minutes late.  We strive to give you quality time with our providers, and arriving late affects you and other patients whose appointments are after yours.  Also, if you no show three or more times for appointments you may be dismissed from the clinic at the providers discretion.     ?Again, thank you for choosing [facility].  Our hope is that these requests will decrease the amount of time that you wait before being seen by our physicians.       ?_____________________________________________________________ ? ?Should you have questions after your visit to [facility], please contact our office at [phone] and follow the prompts.  Our office hours are 8:00 a.m. and 4:30 p.m. Monday - Friday.  Please note that voicemails left after 4:00 p.m. may not be returned until the following business day.  We are closed weekends and major holidays.  You do have access to a nurse 24-7, just call the main number to the clinic [phone] and do not press any options, hold on the line and a nurse will answer the phone.   ? ?For prescription refill requests,  have your pharmacy contact our office and allow 72 hours.   ? ?Due to Covid, you will need to wear a mask upon entering the hospital. If you do not have a mask, a mask will be given to you at the Main Entrance upon arrival. For doctor visits, patients may have 1 support person age 18 or older with them. For treatment visits, patients can not have anyone with them due to social distancing guidelines and our immunocompromised population.  ? ?   ?

## 2021-07-07 ENCOUNTER — Other Ambulatory Visit: Payer: Self-pay

## 2021-07-07 ENCOUNTER — Inpatient Hospital Stay (HOSPITAL_COMMUNITY): Payer: Medicare Other

## 2021-07-07 VITALS — BP 139/73 | HR 63 | Temp 96.7°F | Resp 20

## 2021-07-07 DIAGNOSIS — C8338 Diffuse large B-cell lymphoma, lymph nodes of multiple sites: Secondary | ICD-10-CM

## 2021-07-07 DIAGNOSIS — Z5112 Encounter for antineoplastic immunotherapy: Secondary | ICD-10-CM | POA: Diagnosis not present

## 2021-07-07 MED ORDER — PEGFILGRASTIM-BMEZ 6 MG/0.6ML ~~LOC~~ SOSY
6.0000 mg | PREFILLED_SYRINGE | Freq: Once | SUBCUTANEOUS | Status: AC
  Administered 2021-07-07: 6 mg via SUBCUTANEOUS
  Filled 2021-07-07: qty 0.6

## 2021-07-07 NOTE — Patient Instructions (Signed)
Roy [name]  Discharge Instructions: ?Thank you for choosing [facility] to provide your oncology and hematology care.  ?If you have a lab appointment with the [facility], please come in thru the Main Entrance and check in at the main information desk. ? ?Wear comfortable clothing and clothing appropriate for easy access to any Portacath or PICC line.  ? ?We strive to give you quality time with your provider. You may need to reschedule your appointment if you arrive late (15 or more minutes).  Arriving late affects you and other patients whose appointments are after yours.  Also, if you miss three or more appointments without notifying the office, you may be dismissed from the clinic at the provider?s discretion.    ?  ?For prescription refill requests, have your pharmacy contact our office and allow 72 hours for refills to be completed.   ? ?Today you received Zietenzo injection. ?  ? ?BELOW ARE SYMPTOMS THAT SHOULD BE REPORTED IMMEDIATELY: ?*FEVER GREATER THAN 100.4 F (38 ?C) OR HIGHER ?*CHILLS OR SWEATING ?*NAUSEA AND VOMITING THAT IS NOT CONTROLLED WITH YOUR NAUSEA MEDICATION ?*UNUSUAL SHORTNESS OF BREATH ?*UNUSUAL BRUISING OR BLEEDING ?*URINARY PROBLEMS (pain or burning when urinating, or frequent urination) ?*BOWEL PROBLEMS (unusual diarrhea, constipation, pain near the anus) ?TENDERNESS IN MOUTH AND THROAT WITH OR WITHOUT PRESENCE OF ULCERS (sore throat, sores in mouth, or a toothache) ?UNUSUAL RASH, SWELLING OR PAIN  ?UNUSUAL VAGINAL DISCHARGE OR ITCHING  ? ?Items with * indicate a potential emergency and should be followed up as soon as possible or go to the Emergency Department if any problems should occur. ? ?Please show the CHEMOTHERAPY ALERT CARD or IMMUNOTHERAPY ALERT CARD at check-in to the Emergency Department and triage nurse. ? ?Should you have questions after your visit or need to cancel or reschedule your appointment, please contact [facility]  [phone]  and follow the prompts.  Office hours are 8:00 a.m. to 4:30 p.m. Monday - Friday. Please note that voicemails left after 4:00 p.m. may not be returned until the following business day.  We are closed weekends and major holidays. You have access to a nurse at all times for urgent questions. Please call the main number to the clinic [phone] and follow the prompts. ? ?For any non-urgent questions, you may also contact your provider using MyChart. We now offer e-Visits for anyone 70 and older to request care online for non-urgent symptoms. For details visit [email or web address]. ?  ?Also download the MyChart app! Go to the app store, search "MyChart", open the app, select [facility], and log in with your MyChart username and password. ? ?Due to Covid, a mask is required upon entering the hospital/clinic. If you do not have a mask, one will be given to you upon arrival. For doctor visits, patients may have 1 support person aged 62 or older with them. For treatment visits, patients cannot have anyone with them due to current Covid guidelines and our immunocompromised population.  ?

## 2021-07-07 NOTE — Progress Notes (Signed)
Roy [name] presents today for injection per the provider's orders.  Zietenzo administration without incident; injection site WNL; see MAR for injection details.  Patient tolerated procedure well and without incident.  No questions or complaints noted at this time.  ? ?Discharged from clinic ambulatory with cane in stable condition. Alert and oriented x 3. F/U with [facility] as scheduled.   ?

## 2021-07-12 ENCOUNTER — Encounter (HOSPITAL_COMMUNITY): Payer: Self-pay

## 2021-07-13 ENCOUNTER — Other Ambulatory Visit: Payer: Self-pay | Admitting: *Deleted

## 2021-07-13 ENCOUNTER — Other Ambulatory Visit: Payer: Self-pay

## 2021-07-13 ENCOUNTER — Encounter (HOSPITAL_COMMUNITY): Payer: Self-pay

## 2021-07-13 ENCOUNTER — Inpatient Hospital Stay (HOSPITAL_COMMUNITY)
Admission: EM | Admit: 2021-07-13 | Discharge: 2021-07-17 | DRG: 872 | Disposition: A | Discharge status: Home / Self Care | Payer: Medicare Other | Attending: Internal Medicine | Admitting: Internal Medicine

## 2021-07-13 ENCOUNTER — Emergency Department (HOSPITAL_COMMUNITY): Payer: Medicare Other

## 2021-07-13 DIAGNOSIS — E872 Acidosis, unspecified: Secondary | ICD-10-CM | POA: Diagnosis present

## 2021-07-13 DIAGNOSIS — I959 Hypotension, unspecified: Secondary | ICD-10-CM | POA: Diagnosis present

## 2021-07-13 DIAGNOSIS — R5081 Fever presenting with conditions classified elsewhere: Secondary | ICD-10-CM

## 2021-07-13 DIAGNOSIS — D72819 Decreased white blood cell count, unspecified: Secondary | ICD-10-CM

## 2021-07-13 DIAGNOSIS — R197 Diarrhea, unspecified: Secondary | ICD-10-CM | POA: Diagnosis present

## 2021-07-13 DIAGNOSIS — C8330 Diffuse large B-cell lymphoma, unspecified site: Secondary | ICD-10-CM | POA: Diagnosis present

## 2021-07-13 DIAGNOSIS — D6959 Other secondary thrombocytopenia: Secondary | ICD-10-CM | POA: Diagnosis present

## 2021-07-13 DIAGNOSIS — Z96642 Presence of left artificial hip joint: Secondary | ICD-10-CM | POA: Diagnosis present

## 2021-07-13 DIAGNOSIS — Z8249 Family history of ischemic heart disease and other diseases of the circulatory system: Secondary | ICD-10-CM

## 2021-07-13 DIAGNOSIS — E876 Hypokalemia: Secondary | ICD-10-CM | POA: Diagnosis present

## 2021-07-13 DIAGNOSIS — Z8349 Family history of other endocrine, nutritional and metabolic diseases: Secondary | ICD-10-CM

## 2021-07-13 DIAGNOSIS — Z91048 Other nonmedicinal substance allergy status: Secondary | ICD-10-CM

## 2021-07-13 DIAGNOSIS — E039 Hypothyroidism, unspecified: Secondary | ICD-10-CM | POA: Diagnosis present

## 2021-07-13 DIAGNOSIS — A419 Sepsis, unspecified organism: Principal | ICD-10-CM | POA: Diagnosis present

## 2021-07-13 DIAGNOSIS — E7800 Pure hypercholesterolemia, unspecified: Secondary | ICD-10-CM | POA: Diagnosis present

## 2021-07-13 DIAGNOSIS — Z7952 Long term (current) use of systemic steroids: Secondary | ICD-10-CM

## 2021-07-13 DIAGNOSIS — Z882 Allergy status to sulfonamides status: Secondary | ICD-10-CM

## 2021-07-13 DIAGNOSIS — D709 Neutropenia, unspecified: Secondary | ICD-10-CM | POA: Diagnosis present

## 2021-07-13 DIAGNOSIS — Z9181 History of falling: Secondary | ICD-10-CM

## 2021-07-13 DIAGNOSIS — I4891 Unspecified atrial fibrillation: Secondary | ICD-10-CM | POA: Diagnosis present

## 2021-07-13 DIAGNOSIS — C8510 Unspecified B-cell lymphoma, unspecified site: Secondary | ICD-10-CM

## 2021-07-13 DIAGNOSIS — E871 Hypo-osmolality and hyponatremia: Secondary | ICD-10-CM | POA: Diagnosis present

## 2021-07-13 DIAGNOSIS — Z79899 Other long term (current) drug therapy: Secondary | ICD-10-CM

## 2021-07-13 DIAGNOSIS — Z20822 Contact with and (suspected) exposure to covid-19: Secondary | ICD-10-CM | POA: Diagnosis present

## 2021-07-13 DIAGNOSIS — L03221 Cellulitis of neck: Secondary | ICD-10-CM | POA: Diagnosis present

## 2021-07-13 DIAGNOSIS — I10 Essential (primary) hypertension: Secondary | ICD-10-CM | POA: Diagnosis present

## 2021-07-13 DIAGNOSIS — E785 Hyperlipidemia, unspecified: Secondary | ICD-10-CM

## 2021-07-13 DIAGNOSIS — K219 Gastro-esophageal reflux disease without esophagitis: Secondary | ICD-10-CM | POA: Diagnosis present

## 2021-07-13 DIAGNOSIS — Z7901 Long term (current) use of anticoagulants: Secondary | ICD-10-CM

## 2021-07-13 DIAGNOSIS — N400 Enlarged prostate without lower urinary tract symptoms: Secondary | ICD-10-CM | POA: Diagnosis present

## 2021-07-13 DIAGNOSIS — Z9049 Acquired absence of other specified parts of digestive tract: Secondary | ICD-10-CM

## 2021-07-13 DIAGNOSIS — Z6836 Body mass index (BMI) 36.0-36.9, adult: Secondary | ICD-10-CM

## 2021-07-13 DIAGNOSIS — E669 Obesity, unspecified: Secondary | ICD-10-CM | POA: Diagnosis present

## 2021-07-13 DIAGNOSIS — Z79890 Hormone replacement therapy (postmenopausal): Secondary | ICD-10-CM

## 2021-07-13 LAB — URINALYSIS, ROUTINE W REFLEX MICROSCOPIC
Bilirubin Urine: NEGATIVE
Glucose, UA: NEGATIVE mg/dL
Ketones, ur: NEGATIVE mg/dL
Leukocytes,Ua: NEGATIVE
Nitrite: NEGATIVE
Protein, ur: 30 mg/dL — AB
Specific Gravity, Urine: 1.014 (ref 1.005–1.030)
pH: 5 (ref 5.0–8.0)

## 2021-07-13 LAB — BASIC METABOLIC PANEL WITH GFR
Anion gap: 12 (ref 5–15)
BUN: 18 mg/dL (ref 8–23)
CO2: 30 mmol/L (ref 22–32)
Calcium: 8.7 mg/dL — ABNORMAL LOW (ref 8.9–10.3)
Chloride: 90 mmol/L — ABNORMAL LOW (ref 98–111)
Creatinine, Ser: 1.27 mg/dL — ABNORMAL HIGH (ref 0.61–1.24)
GFR, Estimated: >60 mL/min
Glucose, Bld: 94 mg/dL (ref 70–99)
Potassium: 2.6 mmol/L — CL (ref 3.5–5.1)
Sodium: 132 mmol/L — ABNORMAL LOW (ref 135–145)

## 2021-07-13 LAB — APTT: aPTT: 43 seconds — ABNORMAL HIGH (ref 24–36)

## 2021-07-13 LAB — CBG MONITORING, ED: Glucose-Capillary: 87 mg/dL (ref 70–99)

## 2021-07-13 LAB — CBC
HCT: 37.3 % — ABNORMAL LOW (ref 39.0–52.0)
Hemoglobin: 12.1 g/dL — ABNORMAL LOW (ref 13.0–17.0)
MCH: 31.1 pg (ref 26.0–34.0)
MCHC: 32.4 g/dL (ref 30.0–36.0)
MCV: 95.9 fL (ref 80.0–100.0)
Platelets: DECREASED 10*3/uL (ref 150–400)
RBC: 3.89 MIL/uL — ABNORMAL LOW (ref 4.22–5.81)
RDW: 15.2 % (ref 11.5–15.5)
WBC: 1.7 10*3/uL — ABNORMAL LOW (ref 4.0–10.5)
nRBC: 0 % (ref 0.0–0.2)

## 2021-07-13 LAB — LACTIC ACID, PLASMA
Lactic Acid, Venous: 4.1 mmol/L (ref 0.5–1.9)
Lactic Acid, Venous: 3.3 mmol/L (ref 0.5–1.9)

## 2021-07-13 LAB — DIFFERENTIAL
Basophils Absolute: 0 10*3/uL (ref 0.0–0.1)
Basophils Relative: 3 %
Blasts: 4 %
Eosinophils Absolute: 0 10*3/uL (ref 0.0–0.5)
Eosinophils Relative: 2 %
Lymphocytes Relative: 70 %
Lymphs Abs: 0.9 10*3/uL (ref 0.7–4.0)
Monocytes Absolute: 0.2 10*3/uL (ref 0.1–1.0)
Monocytes Relative: 15 %
Myelocytes: 1 %
Neutro Abs: 0.1 10*3/uL — CL (ref 1.7–7.7)
Neutrophils Relative %: 5 %

## 2021-07-13 LAB — RESP PANEL BY RT-PCR (FLU A&B, COVID) ARPGX2
Influenza A by PCR: NEGATIVE
Influenza B by PCR: NEGATIVE
SARS Coronavirus 2 by RT PCR: NEGATIVE

## 2021-07-13 LAB — PROTIME-INR
INR: 1.4 — ABNORMAL HIGH (ref 0.8–1.2)
Prothrombin Time: 16.8 seconds — ABNORMAL HIGH (ref 11.4–15.2)

## 2021-07-13 LAB — CK: Total CK: 94 U/L (ref 49–397)

## 2021-07-13 MED ORDER — LACTATED RINGERS IV BOLUS (SEPSIS)
1000.0000 mL | Freq: Once | INTRAVENOUS | Status: AC
  Administered 2021-07-13: 21:00:00 1000 mL via INTRAVENOUS

## 2021-07-13 MED ORDER — VANCOMYCIN HCL 1500 MG/300ML IV SOLN
1500.0000 mg | Freq: Once | INTRAVENOUS | Status: AC
  Administered 2021-07-13: 22:00:00 1500 mg via INTRAVENOUS
  Filled 2021-07-13: qty 300

## 2021-07-13 MED ORDER — SODIUM CHLORIDE 0.9 % IV BOLUS
1000.0000 mL | Freq: Once | INTRAVENOUS | Status: AC
  Administered 2021-07-13: 20:00:00 1000 mL via INTRAVENOUS

## 2021-07-13 MED ORDER — LACTATED RINGERS IV BOLUS (SEPSIS)
200.0000 mL | Freq: Once | INTRAVENOUS | Status: AC
  Administered 2021-07-13: 22:00:00 200 mL via INTRAVENOUS

## 2021-07-13 MED ORDER — POTASSIUM CHLORIDE 10 MEQ/100ML IV SOLN
10.0000 meq | INTRAVENOUS | Status: AC
  Administered 2021-07-13 (×2): 10 meq via INTRAVENOUS
  Filled 2021-07-13 (×2): qty 100

## 2021-07-13 MED ORDER — ACETAMINOPHEN 325 MG PO TABS
650.0000 mg | ORAL_TABLET | Freq: Once | ORAL | Status: AC
  Administered 2021-07-13: 23:00:00 650 mg via ORAL
  Filled 2021-07-13: qty 2

## 2021-07-13 MED ORDER — METRONIDAZOLE 500 MG/100ML IV SOLN
500.0000 mg | Freq: Once | INTRAVENOUS | Status: AC
  Administered 2021-07-13: 21:00:00 500 mg via INTRAVENOUS
  Filled 2021-07-13: qty 100

## 2021-07-13 MED ORDER — SODIUM CHLORIDE 0.9 % IV SOLN
2.0000 g | Freq: Three times a day (TID) | INTRAVENOUS | Status: DC
  Administered 2021-07-14 – 2021-07-17 (×11): 2 g via INTRAVENOUS
  Filled 2021-07-13 (×12): qty 2

## 2021-07-13 MED ORDER — SODIUM CHLORIDE 0.9 % IV SOLN
2.0000 g | Freq: Once | INTRAVENOUS | Status: AC
  Administered 2021-07-13: 21:00:00 2 g via INTRAVENOUS
  Filled 2021-07-13: qty 2

## 2021-07-13 MED ORDER — LACTATED RINGERS IV SOLN: INTRAVENOUS | Status: AC

## 2021-07-13 MED ORDER — VANCOMYCIN HCL 1250 MG/250ML IV SOLN
1250.0000 mg | INTRAVENOUS | Status: DC
Start: 2021-07-14 — End: 2021-07-14

## 2021-07-13 MED ORDER — VANCOMYCIN HCL IN DEXTROSE 1-5 GM/200ML-% IV SOLN: 1000.0000 mg | Freq: Once | INTRAVENOUS | Status: DC

## 2021-07-13 MED ORDER — LACTULOSE 20 GM/30ML PO SOLN: ORAL | 2 refills | Status: DC

## 2021-07-13 MED ORDER — MIDODRINE HCL 5 MG PO TABS
5.0000 mg | ORAL_TABLET | Freq: Three times a day (TID) | ORAL | Status: DC
  Administered 2021-07-13 – 2021-07-17 (×12): 5 mg via ORAL
  Filled 2021-07-13 (×12): qty 1

## 2021-07-13 NOTE — ED Notes (Signed)
Lab at bedside

## 2021-07-13 NOTE — Sepsis Progress Note (Signed)
Following per sepsis protocol   

## 2021-07-13 NOTE — ED Notes (Signed)
Patient did NOT take his LOW BP medicine today. ?

## 2021-07-13 NOTE — ED Notes (Signed)
Urinal at bedside.  

## 2021-07-13 NOTE — ED Provider Notes (Addendum)
[facility] EMERGENCY DEPARTMENT Provider Note   CSN: [id number] Arrival date & time: 07/13/21  1825     History  Chief Complaint  Patient presents with   Weakness    Roy [name] is a 71 y.o. male.  Patient followed by Dr. Zigmund Daniel at hematology oncology for diffuse large B-cell lymphoma.  Last seen by them on March 1.  Had a white blood cell count 9.8 at that time.  Had an infusion I think on March 3.  Discussed with Dr. Raliegh Ip.  He thinks that he gave him something to boost his white blood cell counts because they expected to drop.  Patient had a weakness and diarrhea today.  He was brought in by EMS.  They state he been on the ground since 11:00.  Son found him covered in diarrhea.  Patient felt warm to touch.  Patient alert and oriented.  No complaints.  Patient's temp on arrival was 100.7.  His respiratory rate was 22 but it did jump up into the 30s.  Initial blood pressure was 93.  Oxygen sats were 97%.  Patient now on 2 L of oxygen because he feels more comfortable on that.  Pressures did go on to drop down to 65 systolic.  This initiated the sepsis protocol.  Patient is known to have low blood pressures.  And he is on midodrine.  Normally 5 mg 3 times a day.  Did not take any of that today according to patient.  Patient had not any diarrhea here.  Past medical history significant for the lymphoma gastroesophageal reflux disease obesity high cholesterol atrial fibrillation and hypertension.  Patient is supposed to be on Coumadin.  Patient's INR today is 1.4.  It seems to be a little subtherapeutic.      Home Medications Prior to Admission medications   Medication Sig Start Date End Date Taking? Authorizing Provider  Calcium Carbonate-Vitamin D (CALCIUM-VITAMIN D) 600-125 MG-UNIT TABS Take by mouth.   Yes [provider]  Cholecalciferol (VITAMIN D3) 25 MCG (1000 UT) CAPS Take by mouth daily.   Yes [provider]  CYCLOPHOSPHAMIDE IV Inject into the vein every 21 (  twenty-one) days. 04/12/21  Yes [provider]  DOXORUBICIN HCL IV Inject into the vein every 21 ( twenty-one) days. 04/12/21  Yes [provider]  EQUATE STOOL SOFTENER 100 MG capsule Take 2 capsules by mouth twice daily 06/08/21  Yes Pennington, Rebekah M, PA-C  ferrous sulfate 325 (65 FE) MG EC tablet Take 1 tablet by mouth once daily with breakfast 06/16/21  Yes Pennington, Rebekah M, PA-C  fludrocortisone (FLORINEF) 0.1 MG tablet Take 1 tablet (0.1 mg total) by mouth daily with breakfast. 06/05/21  Yes Nida, Marella Chimes, MD  gabapentin (NEURONTIN) 300 MG capsule Take 1 capsule (300 mg total) by mouth 2 (two) times daily. 07/05/21  Yes Derek Jack, MD  Lactulose 20 GM/30ML SOLN Take 30 ml by mouth every 3 hours until a bowel movement occurs.  Then take 30 ml by mouth at bedtime. Patient taking differently: Take 30 mLs by mouth See admin instructions. Take 30 ml by mouth every 3 hours until a bowel movement occurs.  Then take 30 ml by mouth at bedtime. 07/13/21  Yes Derek Jack, MD  levothyroxine (SYNTHROID) 88 MCG tablet Take 1 tablet (88 mcg total) by mouth daily before breakfast. 06/05/21  Yes Nida, Marella Chimes, MD  magnesium oxide (MAG-OX) 400 (240 Mg) MG tablet Take 1 tablet (400 mg total) by mouth 2 (  two) times daily. 10/24/20  Yes Tat, Shanon Brow, MD  midodrine (PROAMATINE) 5 MG tablet TAKE 2 TABLETS EACH MORNING, 1 TABLET EACH AFTERNOON AND 1 TABLET EACH EVENING. Patient taking differently: 5 mg 3 (three) times daily with meals. 05/15/21  Yes Nida, Marella Chimes, MD  omeprazole (PRILOSEC) 10 MG capsule Take 1 capsule by mouth daily. 11/15/20  Yes [provider]  potassium chloride (KLOR-CON) 10 MEQ tablet Take 10 mEq by mouth 2 (two) times daily. 11/04/20  Yes [provider]  predniSONE (DELTASONE) 10 MG tablet Take 1 tablet (10 mg total) by mouth daily with breakfast. 03/02/21  Yes Nida, Marella Chimes, MD  predniSONE (DELTASONE) 20 MG tablet  Take 100 mg (5 tablets) by mouth daily starting on each day of chemotherapy treatment, then 4 days after.  Repeat every 3 weeks. Do not take on the weeks you are not receiving treatment. 04/07/21  Yes Derek Jack, MD  riTUXimab in sodium chloride 0.9 % 250 mL Inject into the vein every 21 ( twenty-one) days. 04/12/21  Yes [provider]  rosuvastatin (CRESTOR) 5 MG tablet Take 1 tablet by mouth at bedtime. 08/23/20  Yes [provider]  tamsulosin (FLOMAX) 0.4 MG CAPS capsule Take 1 capsule by mouth daily. 02/28/21  Yes [provider]  VINCRISTINE SULFATE IV Inject into the vein every 21 ( twenty-one) days. 04/12/21  Yes [provider]  vitamin B-12 (CYANOCOBALAMIN) 1000 MCG tablet Take 1 tablet (1,000 mcg total) by mouth daily. 02/28/21  Yes Pennington, Rebekah M, PA-C  warfarin (COUMADIN) 5 MG tablet Take 1 tablet (5 mg total) by mouth every evening. Patient taking differently: Take 5 mg by mouth See admin instructions. Take 10 mg on Tuesday, Thursday and Saturday and all other days take 7.5 01/15/21  Yes Emokpae, Courage, MD  acetaminophen (TYLENOL) 325 MG tablet Take 2 tablets (650 mg total) by mouth every 6 (six) hours as needed for headache. Patient not taking: Reported on 07/07/2021 01/13/21   Roxan Hockey, MD  benzonatate (TESSALON) 200 MG capsule Take 1 capsule (200 mg total) by mouth 3 (three) times daily as needed for cough. Patient not taking: Reported on 07/07/2021 06/26/21   Volney American, PA-C  guaiFENesin (MUCINEX) 600 MG 12 hr tablet Take 1 tablet (600 mg total) by mouth 2 (two) times daily as needed. Patient not taking: Reported on 07/13/2021 06/26/21   Volney American, PA-C  lidocaine (XYLOCAINE) 2 % jelly Apply 1 application. topically as needed (pain). 01/16/21 01/16/22  [provider]  lidocaine-prilocaine (EMLA) cream Apply a small amount to port a cath site and cover with plastic wrap 1 hour prior to chemotherapy  appointments 04/07/21   Derek Jack, MD  prednisoLONE acetate (PRED FORTE) 1 % ophthalmic suspension Place into the left eye. Patient not taking: Reported on 07/13/2021 04/14/21   [provider]  prochlorperazine (COMPAZINE) 10 MG tablet Take 1 tablet (10 mg total) by mouth every 6 (six) hours as needed (Nausea or vomiting). Patient not taking: Reported on 07/13/2021 04/07/21   Derek Jack, MD  psyllium (REGULOID) 0.52 g capsule Take by mouth. Patient not taking: Reported on 07/13/2021 01/16/21 01/16/22  [provider]      Allergies    Adhesive [tape] and Sulfa antibiotics    Review of Systems   Review of Systems  Constitutional:  Negative for chills and fever.  HENT:  Negative for ear pain and sore throat.   Eyes:  Negative for pain and visual disturbance.  Respiratory:  Negative for cough and shortness of breath.   Cardiovascular:  Negative for chest pain and palpitations.  Gastrointestinal:  Positive for diarrhea. Negative for abdominal pain and vomiting.  Genitourinary:  Negative for dysuria and hematuria.  Musculoskeletal:  Negative for arthralgias and back pain.  Skin:  Negative for color change and rash.  Neurological:  Positive for weakness. Negative for seizures and syncope.  All other systems reviewed and are negative.  Physical Exam Updated Vital Signs BP (!) 89/57    Pulse 87    Temp (!) 100.7 F (38.2 C) (Oral)    Resp (!) 23    Ht 1.727 m ('5\' 8"'$ )    Wt 108 kg    SpO2 93%    BMI 36.20 kg/m  Physical Exam Vitals and nursing note reviewed.  Constitutional:      General: He is not in acute distress.    Appearance: Normal appearance. He is well-developed.  HENT:     Head: Normocephalic and atraumatic.  Eyes:     Extraocular Movements: Extraocular movements intact.     Conjunctiva/sclera: Conjunctivae normal.     Pupils: Pupils are equal, round, and reactive to light.  Cardiovascular:     Rate and Rhythm: Regular rhythm. Tachycardia  present.     Heart sounds: No murmur heard. Pulmonary:     Effort: Pulmonary effort is normal. No respiratory distress.     Breath sounds: Normal breath sounds.     Comments: Patient is tachypneic. Abdominal:     Palpations: Abdomen is soft.     Tenderness: There is no abdominal tenderness.  Musculoskeletal:        General: No swelling.     Cervical back: Normal range of motion and neck supple.  Skin:    General: Skin is warm and dry.     Capillary Refill: Capillary refill takes 2 to 3 seconds.  Neurological:     General: No focal deficit present.     Mental Status: He is alert and oriented to person, place, and time.  Psychiatric:        Mood and Affect: Mood normal.    ED Results / Procedures / Treatments   Labs (all labs ordered are listed, but only abnormal results are displayed) Labs Reviewed  BASIC METABOLIC PANEL - Abnormal; Notable for the following components:      Result Value   Sodium 132 (*)    Potassium 2.6 (*)    Chloride 90 (*)    Creatinine, Ser 1.27 (*)    Calcium 8.7 (*)    All other components within normal limits  CBC - Abnormal; Notable for the following components:   WBC 1.7 (*)    RBC 3.89 (*)    Hemoglobin 12.1 (*)    HCT 37.3 (*)    All other components within normal limits  URINALYSIS, ROUTINE W REFLEX MICROSCOPIC - Abnormal; Notable for the following components:   Color, Urine AMBER (*)    APPearance HAZY (*)    Hgb urine dipstick SMALL (*)    Protein, ur 30 (*)    Bacteria, UA MANY (*)    All other components within normal limits  PROTIME-INR - Abnormal; Notable for the following components:   Prothrombin Time 16.8 (*)    INR 1.4 (*)    All other components within normal limits  APTT - Abnormal; Notable for the following components:   aPTT 43 (*)    All other components within normal limits  RESP PANEL  BY RT-PCR (FLU A&B, COVID) ARPGX2  CULTURE, BLOOD (ROUTINE X 2)  CULTURE, BLOOD (ROUTINE X 2)  URINE CULTURE  CK  LACTIC ACID,  PLASMA  LACTIC ACID, PLASMA  CBG MONITORING, ED    EKG EKG Interpretation  Date/Time:  Thursday July 13 2021 19:43:51 EST Ventricular Rate:  109 PR Interval:  131 QRS Duration: 160 QT Interval:  428 QTC Calculation: 577 R Axis:   -89 Text Interpretation: Sinus tachycardia RBBB and LAFB Confirmed by Fredia Sorrow [phone] on 07/13/2021 8:40:28 PM  Radiology DG Chest Port 1 View  Result Date: 07/13/2021 CLINICAL DATA:  Possible sepsis, weakness EXAM: PORTABLE CHEST 1 VIEW COMPARISON:  05/12/2021 FINDINGS: Right-sided central venous port tip over the SVC. No focal opacity, pleural effusion or pneumothorax. Borderline cardiomegaly. IMPRESSION: No active disease. Electronically Signed   By: Donavan Foil M.D.   On: 07/13/2021 21:06    Procedures Procedures    Medications Ordered in ED Medications  midodrine (PROAMATINE) tablet 5 mg (5 mg Oral Given 07/13/21 2052)  lactated ringers infusion (has no administration in time range)  metroNIDAZOLE (FLAGYL) IVPB 500 mg (500 mg Intravenous New Bag/Given 07/13/21 2056)  potassium chloride 10 mEq in 100 mL IVPB (10 mEq Intravenous New Bag/Given 07/13/21 2107)  vancomycin (VANCOREADY) IVPB 1500 mg/300 mL (has no administration in time range)  ceFEPIme (MAXIPIME) 2 g in sodium chloride 0.9 % 100 mL IVPB (has no administration in time range)  vancomycin (VANCOREADY) IVPB 1250 mg/250 mL (has no administration in time range)  sodium chloride 0.9 % bolus 1,000 mL (0 mLs Intravenous Stopped 07/13/21 2100)  lactated ringers bolus 1,000 mL (1,000 mLs Intravenous New Bag/Given 07/13/21 2053)    And  lactated ringers bolus 1,000 mL (1,000 mLs Intravenous New Bag/Given 07/13/21 2057)    And  lactated ringers bolus 200 mL (200 mLs Intravenous New Bag/Given 07/13/21 2142)  ceFEPIme (MAXIPIME) 2 g in sodium chloride 0.9 % 100 mL IVPB (0 g Intravenous Stopped 07/13/21 2141)    ED Course/ Medical Decision Making/ A&P                           Medical Decision  Making Amount and/or Complexity of Data Reviewed Labs: ordered. Radiology: ordered. ECG/medicine tests: ordered.  Risk OTC drugs. Prescription drug management. Decision regarding hospitalization.   CRITICAL CARE Performed by: Fredia Sorrow Total critical care time: 60 minutes Critical care time was exclusive of separately billable procedures and treating other patients. Critical care was necessary to treat or prevent imminent or life-threatening deterioration. Critical care was time spent personally by me on the following activities: development of treatment plan with patient and/or surrogate as well as nursing, discussions with consultants, evaluation of patient's response to treatment, examination of patient, obtaining history from patient or surrogate, ordering and performing treatments and interventions, ordering and review of laboratory studies, ordering and review of radiographic studies, pulse oximetry and re-evaluation of patient's condition.  Patient arrived here febrile.  A little bit tachypnea which got worse.  Then he did drop his pressures.  Was slightly tachycardic.  Heart rate went up to 111.  This triggered sepsis protocol.  Patient treated with broad-spectrum antibiotics.  Patient also given a dose of his midodrine.  5 mg.  Patient also for the hypokalemia was given 2 runs of potassium IV 10 mEq each.  Patient's INR is a little subtherapeutic.  X-ray noncontributory.  No acute findings.  Urinalysis did have a lot of bacteria  as a possible source.  CBC significant for a leukopenia.  White blood cell count was 1.7.  With a differential significant for absolute neutrophils 0.1.  His initial lactic acid was elevated and above 4.  At 4.1.  Repeat lactic acid after fluid resuscitation came down to 3.3.  Patient's blood pressures gradually improved.  But he still fairly consistently in the 00P systolic.  Occasionally got up as high as 233 systolic.  Recently did have a  drop documented at 43.  But repeat blood pressure done after repositioning him.  And we got 92 systolic.  We will give another liter of fluid.  At this point in time do not feel patient needs pressure agents administered.  In addition his blood pressures are normally on the low side according to patient that is why he is on the medication midodrine.  Hospitalist will see.  Patient did go on to have a another diarrhea stool here.  We will check for C. difficile.  Final Clinical Impression(s) / ED Diagnoses Final diagnoses:  Sepsis, due to unspecified organism, unspecified whether acute organ dysfunction present ([place])  Hypokalemia  Leukopenia, unspecified type  Large B-cell lymphoma [facility])    Rx / DC Orders ED Discharge Orders     None         Fredia Sorrow, MD 07/13/21 2047    Fredia Sorrow, MD 07/14/21 0021

## 2021-07-13 NOTE — ED Notes (Signed)
ED Provider at bedside. 

## 2021-07-13 NOTE — ED Provider Notes (Incomplete Revision)
[facility] EMERGENCY DEPARTMENT Provider Note   CSN: [id number] Arrival date & time: 07/13/21  1825     History  Chief Complaint  Patient presents with   Weakness    Roy [name] is a 71 y.o. male.  HPI     Home Medications Prior to Admission medications   Medication Sig Start Date End Date Taking? Authorizing Provider  Calcium Carbonate-Vitamin D (CALCIUM-VITAMIN D) 600-125 MG-UNIT TABS Take by mouth.   Yes [provider]  Cholecalciferol (VITAMIN D3) 25 MCG (1000 UT) CAPS Take by mouth daily.   Yes [provider]  CYCLOPHOSPHAMIDE IV Inject into the vein every 21 ( twenty-one) days. 04/12/21  Yes [provider]  DOXORUBICIN HCL IV Inject into the vein every 21 ( twenty-one) days. 04/12/21  Yes [provider]  EQUATE STOOL SOFTENER 100 MG capsule Take 2 capsules by mouth twice daily 06/08/21  Yes Pennington, Rebekah M, PA-C  ferrous sulfate 325 (65 FE) MG EC tablet Take 1 tablet by mouth once daily with breakfast 06/16/21  Yes Pennington, Rebekah M, PA-C  fludrocortisone (FLORINEF) 0.1 MG tablet Take 1 tablet (0.1 mg total) by mouth daily with breakfast. 06/05/21  Yes Nida, Marella Chimes, MD  gabapentin (NEURONTIN) 300 MG capsule Take 1 capsule (300 mg total) by mouth 2 (two) times daily. 07/05/21  Yes Derek Jack, MD  Lactulose 20 GM/30ML SOLN Take 30 ml by mouth every 3 hours until a bowel movement occurs.  Then take 30 ml by mouth at bedtime. Patient taking differently: Take 30 mLs by mouth See admin instructions. Take 30 ml by mouth every 3 hours until a bowel movement occurs.  Then take 30 ml by mouth at bedtime. 07/13/21  Yes Derek Jack, MD  levothyroxine (SYNTHROID) 88 MCG tablet Take 1 tablet (88 mcg total) by mouth daily before breakfast. 06/05/21  Yes Nida, Marella Chimes, MD  magnesium oxide (MAG-OX) 400 (240 Mg) MG tablet Take 1 tablet (400 mg total) by mouth 2 (two) times daily. 10/24/20  Yes Tat, Shanon Brow, MD   midodrine (PROAMATINE) 5 MG tablet TAKE 2 TABLETS EACH MORNING, 1 TABLET EACH AFTERNOON AND 1 TABLET EACH EVENING. Patient taking differently: 5 mg 3 (three) times daily with meals. 05/15/21  Yes Nida, Marella Chimes, MD  omeprazole (PRILOSEC) 10 MG capsule Take 1 capsule by mouth daily. 11/15/20  Yes [provider]  potassium chloride (KLOR-CON) 10 MEQ tablet Take 10 mEq by mouth 2 (two) times daily. 11/04/20  Yes [provider]  predniSONE (DELTASONE) 10 MG tablet Take 1 tablet (10 mg total) by mouth daily with breakfast. 03/02/21  Yes Nida, Marella Chimes, MD  predniSONE (DELTASONE) 20 MG tablet Take 100 mg (5 tablets) by mouth daily starting on each day of chemotherapy treatment, then 4 days after.  Repeat every 3 weeks. Do not take on the weeks you are not receiving treatment. 04/07/21  Yes Derek Jack, MD  riTUXimab in sodium chloride 0.9 % 250 mL Inject into the vein every 21 ( twenty-one) days. 04/12/21  Yes [provider]  rosuvastatin (CRESTOR) 5 MG tablet Take 1 tablet by mouth at bedtime. 08/23/20  Yes [provider]  tamsulosin (FLOMAX) 0.4 MG CAPS capsule Take 1 capsule by mouth daily. 02/28/21  Yes [provider]  VINCRISTINE SULFATE IV Inject into the vein every 21 ( twenty-one) days. 04/12/21  Yes [provider]  vitamin B-12 (CYANOCOBALAMIN) 1000 MCG tablet Take 1 tablet (1,000 mcg total) by mouth daily. 02/28/21  Yes Tarri Abernethy M, PA-C  warfarin (COUMADIN) 5 MG tablet Take 1 tablet (5 mg total) by mouth every evening. Patient taking differently: Take 5 mg by mouth See admin instructions. Take 10 mg on Tuesday, Thursday and Saturday and all other days take 7.5 01/15/21  Yes Emokpae, Courage, MD  acetaminophen (TYLENOL) 325 MG tablet Take 2 tablets (650 mg total) by mouth every 6 (six) hours as needed for headache. Patient not taking: Reported on 07/07/2021 01/13/21   Roxan Hockey, MD  benzonatate (TESSALON) 200  MG capsule Take 1 capsule (200 mg total) by mouth 3 (three) times daily as needed for cough. Patient not taking: Reported on 07/07/2021 06/26/21   Volney American, PA-C  guaiFENesin (MUCINEX) 600 MG 12 hr tablet Take 1 tablet (600 mg total) by mouth 2 (two) times daily as needed. Patient not taking: Reported on 07/13/2021 06/26/21   Volney American, PA-C  lidocaine (XYLOCAINE) 2 % jelly Apply 1 application. topically as needed (pain). 01/16/21 01/16/22  [provider]  lidocaine-prilocaine (EMLA) cream Apply a small amount to port a cath site and cover with plastic wrap 1 hour prior to chemotherapy appointments 04/07/21   Derek Jack, MD  prednisoLONE acetate (PRED FORTE) 1 % ophthalmic suspension Place into the left eye. Patient not taking: Reported on 07/13/2021 04/14/21   [provider]  prochlorperazine (COMPAZINE) 10 MG tablet Take 1 tablet (10 mg total) by mouth every 6 (six) hours as needed (Nausea or vomiting). Patient not taking: Reported on 07/13/2021 04/07/21   Derek Jack, MD  psyllium (REGULOID) 0.52 g capsule Take by mouth. Patient not taking: Reported on 07/13/2021 01/16/21 01/16/22  [provider]      Allergies    Adhesive [tape] and Sulfa antibiotics    Review of Systems   Review of Systems  Physical Exam Updated Vital Signs BP (!) 89/57    Pulse 87    Temp (!) 100.7 F (38.2 C) (Oral)    Resp (!) 23    Ht 1.727 m ('5\' 8"'$ )    Wt 108 kg    SpO2 93%    BMI 36.20 kg/m  Physical Exam  ED Results / Procedures / Treatments   Labs (all labs ordered are listed, but only abnormal results are displayed) Labs Reviewed  BASIC METABOLIC PANEL - Abnormal; Notable for the following components:      Result Value   Sodium 132 (*)    Potassium 2.6 (*)    Chloride 90 (*)    Creatinine, Ser 1.27 (*)    Calcium 8.7 (*)    All other components within normal limits  CBC - Abnormal; Notable for the following components:   WBC 1.7 (*)    RBC  3.89 (*)    Hemoglobin 12.1 (*)    HCT 37.3 (*)    All other components within normal limits  URINALYSIS, ROUTINE W REFLEX MICROSCOPIC - Abnormal; Notable for the following components:   Color, Urine AMBER (*)    APPearance HAZY (*)    Hgb urine dipstick SMALL (*)    Protein, ur 30 (*)    Bacteria, UA MANY (*)    All other components within normal limits  PROTIME-INR - Abnormal; Notable for the following components:   Prothrombin Time 16.8 (*)    INR 1.4 (*)    All other components within normal limits  APTT - Abnormal; Notable for the following components:   aPTT 43 (*)    All other components within  normal limits  RESP PANEL BY RT-PCR (FLU A&B, COVID) ARPGX2  CULTURE, BLOOD (ROUTINE X 2)  CULTURE, BLOOD (ROUTINE X 2)  URINE CULTURE  CK  LACTIC ACID, PLASMA  LACTIC ACID, PLASMA  CBG MONITORING, ED    EKG EKG Interpretation  Date/Time:  Thursday July 13 2021 19:43:51 EST Ventricular Rate:  109 PR Interval:  131 QRS Duration: 160 QT Interval:  428 QTC Calculation: 577 R Axis:   -89 Text Interpretation: Sinus tachycardia RBBB and LAFB Confirmed by Fredia Sorrow [phone]) on 07/13/2021 8:40:28 PM  Radiology DG Chest Port 1 View  Result Date: 07/13/2021 CLINICAL DATA:  Possible sepsis, weakness EXAM: PORTABLE CHEST 1 VIEW COMPARISON:  05/12/2021 FINDINGS: Right-sided central venous port tip over the SVC. No focal opacity, pleural effusion or pneumothorax. Borderline cardiomegaly. IMPRESSION: No active disease. Electronically Signed   By: Donavan Foil M.D.   On: 07/13/2021 21:06    Procedures Procedures    Medications Ordered in ED Medications  midodrine (PROAMATINE) tablet 5 mg (5 mg Oral Given 07/13/21 2052)  lactated ringers infusion (has no administration in time range)  metroNIDAZOLE (FLAGYL) IVPB 500 mg (500 mg Intravenous New Bag/Given 07/13/21 2056)  potassium chloride 10 mEq in 100 mL IVPB (10 mEq Intravenous New Bag/Given 07/13/21 2107)  vancomycin (VANCOREADY)  IVPB 1500 mg/300 mL (has no administration in time range)  ceFEPIme (MAXIPIME) 2 g in sodium chloride 0.9 % 100 mL IVPB (has no administration in time range)  vancomycin (VANCOREADY) IVPB 1250 mg/250 mL (has no administration in time range)  sodium chloride 0.9 % bolus 1,000 mL (0 mLs Intravenous Stopped 07/13/21 2100)  lactated ringers bolus 1,000 mL (1,000 mLs Intravenous New Bag/Given 07/13/21 2053)    And  lactated ringers bolus 1,000 mL (1,000 mLs Intravenous New Bag/Given 07/13/21 2057)    And  lactated ringers bolus 200 mL (200 mLs Intravenous New Bag/Given 07/13/21 2142)  ceFEPIme (MAXIPIME) 2 g in sodium chloride 0.9 % 100 mL IVPB (0 g Intravenous Stopped 07/13/21 2141)    ED Course/ Medical Decision Making/ A&P                           Medical Decision Making Amount and/or Complexity of Data Reviewed Labs: ordered. Radiology: ordered. ECG/medicine tests: ordered.  Risk Prescription drug management. Decision regarding hospitalization.   CRITICAL CARE Performed by: Fredia Sorrow Total critical care time: 60 minutes Critical care time was exclusive of separately billable procedures and treating other patients. Critical care was necessary to treat or prevent imminent or life-threatening deterioration. Critical care was time spent personally by me on the following activities: development of treatment plan with patient and/or surrogate as well as nursing, discussions with consultants, evaluation of patient's response to treatment, examination of patient, obtaining history from patient or surrogate, ordering and performing treatments and interventions, ordering and review of laboratory studies, ordering and review of radiographic studies, pulse oximetry and re-evaluation of patient's condition.  Final Clinical Impression(s) / ED Diagnoses Final diagnoses:  Sepsis, due to unspecified organism, unspecified whether acute organ dysfunction present ([place])  Hypokalemia  Leukopenia,  unspecified type  Large B-cell lymphoma [facility])    Rx / DC Orders ED Discharge Orders     None         Fredia Sorrow, MD 07/13/21 2047

## 2021-07-13 NOTE — ED Triage Notes (Signed)
Patient via EMS from home with weakness and diarrhea. Patient states he has been on the ground since 1100 and son found him covered in diarrhea. Patient feels warm to touch with injury to left 5th toe. Patient alert & oriented X3.  ?

## 2021-07-13 NOTE — Progress Notes (Signed)
Pharmacy Antibiotic Note ? ?Roy [name] is a 71 y.o. male admitted on 07/13/2021 with sepsis in the setting of diffuse large B cell lymphoma on chemotherapy. Pharmacy has been consulted for cefepime and vancomycin dosing. ? ?WBC 1.7. Tmax 100.7. CLCr 63 ml/min. Neutrophils 8100.  ? ?3/9 Vancomycin '1250mg'$  Q 24 hr ?Scr used: 1.27 mg/dL ?Weight: 108 kg ?Vd coeff: 0.5 L/kg ?Est AUC: 490 ? ?Plan: ?Vancomycin '1500mg'$  x1 then '1250mg'$  Q24hr  ?Cefepime 2g q8 hr ?Metronidazole per team  ?Monitor cultures, clinical status, renal function, vancomycin level ?Narrow abx as able and f/u duration  ? ? ?Height: '5\' 8"'$  (172.7 cm) ?Weight: 108 kg (238 lb 1.6 oz) ?IBW/kg (Calculated) : 68.4 ? ?Temp (24hrs), Avg:100.7 ?F (38.2 ?C), Min:100.7 ?F (38.2 ?C), Max:100.7 ?F (38.2 ?C) ? ?Recent Labs  ?Lab 07/13/21 ?1958  ?WBC 1.7*  ?CREATININE 1.27*  ?  ?Estimated Creatinine Clearance: 63.5 mL/min (A) (by C-G formula based on SCr of 1.27 mg/dL (H)).   ? ?Allergies  ?Allergen Reactions  ? Adhesive [Tape]   ?  Contact dermatitis  ? Sulfa Antibiotics   ?  UNKNOWN Childhood reaction  ? ? ?Antimicrobials this admission: ?vanc 3/9 >>  ?cefe 3/9 >>  ?MTZ 3/9  ? ?Dose adjustments this admission: ? ? ?Microbiology results: ?3/9 BCx: pend ? ?Thank you for allowing pharmacy to be a part of this patient?s care. ? ?Benetta Spar, PharmD, BCPS, BCCP ?Clinical Pharmacist ? ?Please check AMION for all [place] phone numbers ?After 10:00 PM, call [place] [phone] ? ?

## 2021-07-13 NOTE — ED Notes (Signed)
Pt O2 sat in upper 80s. Placed pt on 2L [place] for comfort. Pt O2 sat now 94% with a good pleth wave.  ?

## 2021-07-13 NOTE — ED Notes (Signed)
X-ray at bedside

## 2021-07-13 NOTE — ED Notes (Signed)
Giving a bolus of fluids, put in deeper trendelenburg and notified EDP of pt's BP.  ?EDP said he will get to pt asap.  ?

## 2021-07-13 NOTE — ED Notes (Signed)
Pt BP is getting soft. Put in trendelenburg position ?

## 2021-07-13 NOTE — ED Notes (Signed)
Pt A&O x 3: person, place, situation. Can state correct month but incorrect year. Pt states "22" for the year ?

## 2021-07-14 DIAGNOSIS — A419 Sepsis, unspecified organism: Principal | ICD-10-CM

## 2021-07-14 DIAGNOSIS — R5081 Fever presenting with conditions classified elsewhere: Secondary | ICD-10-CM

## 2021-07-14 DIAGNOSIS — R197 Diarrhea, unspecified: Secondary | ICD-10-CM

## 2021-07-14 DIAGNOSIS — D709 Neutropenia, unspecified: Secondary | ICD-10-CM

## 2021-07-14 DIAGNOSIS — K219 Gastro-esophageal reflux disease without esophagitis: Secondary | ICD-10-CM

## 2021-07-14 DIAGNOSIS — D696 Thrombocytopenia, unspecified: Secondary | ICD-10-CM

## 2021-07-14 DIAGNOSIS — N400 Enlarged prostate without lower urinary tract symptoms: Secondary | ICD-10-CM

## 2021-07-14 DIAGNOSIS — E785 Hyperlipidemia, unspecified: Secondary | ICD-10-CM

## 2021-07-14 DIAGNOSIS — I959 Hypotension, unspecified: Secondary | ICD-10-CM

## 2021-07-14 DIAGNOSIS — E872 Acidosis, unspecified: Secondary | ICD-10-CM

## 2021-07-14 DIAGNOSIS — E039 Hypothyroidism, unspecified: Secondary | ICD-10-CM

## 2021-07-14 DIAGNOSIS — D72819 Decreased white blood cell count, unspecified: Secondary | ICD-10-CM

## 2021-07-14 DIAGNOSIS — C8510 Unspecified B-cell lymphoma, unspecified site: Secondary | ICD-10-CM

## 2021-07-14 LAB — COMPREHENSIVE METABOLIC PANEL
ALT: 17 U/L (ref 0–44)
AST: 26 U/L (ref 15–41)
Albumin: 3 g/dL — ABNORMAL LOW (ref 3.5–5.0)
Alkaline Phosphatase: 55 U/L (ref 38–126)
Anion gap: 11 (ref 5–15)
BUN: 15 mg/dL (ref 8–23)
CO2: 26 mmol/L (ref 22–32)
Calcium: 8.5 mg/dL — ABNORMAL LOW (ref 8.9–10.3)
Chloride: 98 mmol/L (ref 98–111)
Creatinine, Ser: 0.91 mg/dL (ref 0.61–1.24)
GFR, Estimated: >60 mL/min (ref >60)
Glucose, Bld: 80 mg/dL (ref 70–99)
Potassium: 3 mmol/L — ABNORMAL LOW (ref 3.5–5.1)
Sodium: 135 mmol/L (ref 135–145)
Total Bilirubin: 1.4 mg/dL — ABNORMAL HIGH (ref 0.3–1.2)
Total Protein: 5.4 g/dL — ABNORMAL LOW (ref 6.5–8.1)

## 2021-07-14 LAB — GASTROINTESTINAL PANEL BY PCR, STOOL (REPLACES STOOL CULTURE)

## 2021-07-14 LAB — CBC
HCT: 36.9 % — ABNORMAL LOW (ref 39.0–52.0)
Hemoglobin: 12.2 g/dL — ABNORMAL LOW (ref 13.0–17.0)
MCH: 31.4 pg (ref 26.0–34.0)
MCHC: 33.1 g/dL (ref 30.0–36.0)
MCV: 95.1 fL (ref 80.0–100.0)
Platelets: 36 10*3/uL — ABNORMAL LOW (ref 150–400)
RBC: 3.88 MIL/uL — ABNORMAL LOW (ref 4.22–5.81)
RDW: 15.2 % (ref 11.5–15.5)
WBC: 0.8 10*3/uL — CL (ref 4.0–10.5)
nRBC: 0 % (ref 0.0–0.2)

## 2021-07-14 LAB — LACTIC ACID, PLASMA
Lactic Acid, Venous: 2.4 mmol/L (ref 0.5–1.9)
Lactic Acid, Venous: 2.3 mmol/L (ref 0.5–1.9)

## 2021-07-14 LAB — C DIFFICILE QUICK SCREEN W PCR REFLEX
C Diff antigen: NEGATIVE
C Diff interpretation: NOT DETECTED
C Diff toxin: NEGATIVE

## 2021-07-14 LAB — PHOSPHORUS: Phosphorus: 4.3 mg/dL (ref 2.5–4.6)

## 2021-07-14 LAB — MAGNESIUM: Magnesium: 1.6 mg/dL — ABNORMAL LOW (ref 1.7–2.4)

## 2021-07-14 MED ORDER — LOPERAMIDE HCL 2 MG PO CAPS: 2.0000 mg | ORAL_CAPSULE | ORAL | Status: DC | PRN

## 2021-07-14 MED ORDER — ACETAMINOPHEN 325 MG PO TABS: 650.0000 mg | ORAL_TABLET | Freq: Four times a day (QID) | ORAL | Status: DC | PRN

## 2021-07-14 MED ORDER — LEVOTHYROXINE SODIUM 88 MCG PO TABS: 88.0000 ug | ORAL_TABLET | Freq: Every day | ORAL | Status: DC

## 2021-07-14 MED ORDER — VANCOMYCIN HCL 1500 MG/300ML IV SOLN
1500.0000 mg | INTRAVENOUS | Status: DC
Start: 2021-07-14 — End: 2021-07-16
  Administered 2021-07-14 – 2021-07-15 (×2): 1500 mg via INTRAVENOUS
  Filled 2021-07-14 (×2): qty 300

## 2021-07-14 MED ORDER — MAGNESIUM SULFATE 2 GM/50ML IV SOLN
2.0000 g | Freq: Once | INTRAVENOUS | Status: AC
  Administered 2021-07-14: 2 g via INTRAVENOUS
  Filled 2021-07-14: qty 50

## 2021-07-14 MED ORDER — LEVOTHYROXINE SODIUM 88 MCG PO TABS
88.0000 ug | ORAL_TABLET | Freq: Every day | ORAL | Status: DC
  Administered 2021-07-14 – 2021-07-17 (×4): 88 ug via ORAL
  Filled 2021-07-14 (×4): qty 1

## 2021-07-14 MED ORDER — ENOXAPARIN SODIUM 40 MG/0.4ML IJ SOSY
40.0000 mg | PREFILLED_SYRINGE | INTRAMUSCULAR | Status: DC
  Administered 2021-07-14: 40 mg via SUBCUTANEOUS
  Filled 2021-07-14: qty 0.4

## 2021-07-14 MED ORDER — POTASSIUM CHLORIDE 10 MEQ/100ML IV SOLN
10.0000 meq | INTRAVENOUS | Status: AC
  Administered 2021-07-14 (×4): 10 meq via INTRAVENOUS
  Filled 2021-07-14 (×4): qty 100

## 2021-07-14 MED ORDER — LACTATED RINGERS IV BOLUS
1000.0000 mL | Freq: Once | INTRAVENOUS | Status: AC
  Administered 2021-07-14: 1000 mL via INTRAVENOUS

## 2021-07-14 MED ORDER — PANTOPRAZOLE SODIUM 40 MG PO TBEC
40.0000 mg | DELAYED_RELEASE_TABLET | Freq: Every day | ORAL | Status: DC
Start: 2021-07-14 — End: 2021-07-17
  Administered 2021-07-14 – 2021-07-17 (×4): 40 mg via ORAL
  Filled 2021-07-14 (×4): qty 1

## 2021-07-14 MED ORDER — POTASSIUM CHLORIDE 10 MEQ/100ML IV SOLN
10.0000 meq | INTRAVENOUS | Status: AC
  Administered 2021-07-14 (×2): 10 meq via INTRAVENOUS
  Filled 2021-07-14 (×2): qty 100

## 2021-07-14 MED ORDER — TAMSULOSIN HCL 0.4 MG PO CAPS
0.4000 mg | ORAL_CAPSULE | Freq: Every day | ORAL | Status: DC
  Administered 2021-07-14 – 2021-07-17 (×4): 0.4 mg via ORAL
  Filled 2021-07-14 (×4): qty 1

## 2021-07-14 MED ORDER — ROSUVASTATIN CALCIUM 10 MG PO TABS
5.0000 mg | ORAL_TABLET | Freq: Every day | ORAL | Status: DC
  Administered 2021-07-14 – 2021-07-16 (×3): 5 mg via ORAL
  Filled 2021-07-14 (×3): qty 1

## 2021-07-14 MED ORDER — SODIUM CHLORIDE 0.9 % IV SOLN: INTRAVENOUS | Status: DC

## 2021-07-14 MED ORDER — POTASSIUM CHLORIDE CRYS ER 20 MEQ PO TBCR
40.0000 meq | EXTENDED_RELEASE_TABLET | Freq: Once | ORAL | Status: AC
  Administered 2021-07-14: 40 meq via ORAL
  Filled 2021-07-14: qty 2

## 2021-07-14 MED ORDER — LOPERAMIDE HCL 2 MG PO CAPS
4.0000 mg | ORAL_CAPSULE | Freq: Once | ORAL | Status: AC
Start: 2021-07-14 — End: 2021-07-14
  Administered 2021-07-14: 4 mg via ORAL
  Filled 2021-07-14: qty 2

## 2021-07-14 NOTE — Assessment & Plan Note (Signed)
Continue Crestor 

## 2021-07-14 NOTE — ED Notes (Signed)
Pt req to be put back on bedpan ?

## 2021-07-14 NOTE — ED Notes (Signed)
Got pt off of bedpan  ?

## 2021-07-14 NOTE — ED Notes (Signed)
Provider at bedside

## 2021-07-14 NOTE — Assessment & Plan Note (Addendum)
Diarrhea was negative for C.Diff antigen and toxin ?GI stool panel negative ?Imodium prn ?Diarrhea is improving ?

## 2021-07-14 NOTE — Assessment & Plan Note (Signed)
-   Continue Flomax 

## 2021-07-14 NOTE — Consult Note (Signed)
[facility] Consultation Oncology  Name: NORRIN SHREFFLER      MRN: [id number]    Location: [facility]07/AP[facility]07  Date: 07/14/2021 Time:8:28 AM   REFERRING PHYSICIAN: Dr. Manuella Ghazi  REASON FOR CONSULT: Neutropenic fever   DIAGNOSIS: Diffuse large B-cell lymphoma  HISTORY OF PRESENT ILLNESS: Mr. Veltre is a 71 year old very pleasant white male who is seen in consultation today at the request of Dr. Manuella Ghazi.  He is well-known to me from office visits.  He is being treated for stage IVb diffuse large B-cell lymphoma diagnosed in November 2022 when he presented with severe pancytopenia.  He was started on R-CHOP on 04/13/2021 with recovery of blood counts.  He has received cycle 5 of R-CHOP on 07/05/2021 followed by Neulasta injection on 07/07/2021.  He reported 2 days history of having severe diarrhea and felt dehydrated and weak and fell on the floor around 11 AM on 07/13/2021.  He was apparently found on the floor covered with diarrhea by his son.  In the ER, he was febrile with temperature of 102 and a blood pressure of 90/49.  White count was low at 1.7 with electrolyte abnormalities.  Chest x-ray did not show any active disease.  He was started on broad-spectrum antibiotics.  He has become afebrile as of this morning.  PAST MEDICAL HISTORY:   Past Medical History:  Diagnosis Date   Atrial fibrillation (HCC)    GERD (gastroesophageal reflux disease)    High cholesterol    Hypertension    Obesity     ALLERGIES: Allergies  Allergen Reactions   Adhesive [Tape]     Contact dermatitis   Sulfa Antibiotics     UNKNOWN Childhood reaction      MEDICATIONS: I have reviewed the patient's current medications.     PAST SURGICAL HISTORY Past Surgical History:  Procedure Laterality Date   APPENDECTOMY     CATARACT EXTRACTION Bilateral 2022   CHOLECYSTECTOMY     ESOPHAGEAL DILATION     multiple times   IR IMAGING GUIDED PORT INSERTION  03/31/2021   JOINT REPLACEMENT Left    hip    FAMILY  HISTORY: Family History  Problem Relation Age of Onset   Heart failure Father    Heart attack Father        Deceased    Thyroid disease Sister     SOCIAL HISTORY:  reports that he has never smoked. He has never used smokeless tobacco. He reports that he does not drink alcohol and does not use drugs.  PERFORMANCE STATUS: The patient's performance status is 2 - Symptomatic, <50% confined to bed  PHYSICAL EXAM: Most Recent Vital Signs: Blood pressure 134/74, pulse 72, temperature 98.3 F (36.8 C), temperature source Oral, resp. rate 20, height '5\' 8"'$  (1.727 m), weight 238 lb 1.6 oz (108 kg), SpO2 100 %. BP 134/74    Pulse 72    Temp 98.3 F (36.8 C) (Oral)    Resp 20    Ht '5\' 8"'$  (1.727 m)    Wt 238 lb 1.6 oz (108 kg)    SpO2 100%    BMI 36.20 kg/m  General appearance: alert, cooperative, and appears stated age Heart: regular rate and rhythm Abdomen:  Soft, nontender with no palpable masses. Extremities:  No edema or cyanosis. Neurologic: Grossly normal  LABORATORY DATA:  Results for orders placed or performed during the hospital encounter of 07/13/21 (from the past 48 hour(s))  Resp Panel by RT-PCR (Flu A&B, Covid) Nasopharyngeal Swab  Status: None   Collection Time: 07/13/21  7:00 PM   Specimen: Nasopharyngeal Swab; Nasopharyngeal(NP) swabs in vial transport medium  Result Value Ref Range   SARS Coronavirus 2 by RT PCR NEGATIVE NEGATIVE    Comment: (NOTE) SARS-CoV-2 target nucleic acids are NOT DETECTED.  The SARS-CoV-2 RNA is generally detectable in upper respiratory specimens during the acute phase of infection. The lowest concentration of SARS-CoV-2 viral copies this assay can detect is 138 copies/mL. A negative result does not preclude SARS-Cov-2 infection and should not be used as the sole basis for treatment or other patient management decisions. A negative result may occur with  improper specimen collection/handling, submission of specimen other than nasopharyngeal  swab, presence of viral mutation(s) within the areas targeted by this assay, and inadequate number of viral copies(<138 copies/mL). A negative result must be combined with clinical observations, patient history, and epidemiological information. The expected result is Negative.  Fact Sheet for Patients:  EntrepreneurPulse.com.au  Fact Sheet for Healthcare Providers:  IncredibleEmployment.be  This test is no t yet approved or cleared by the [place] FDA and  has been authorized for detection and/or diagnosis of SARS-CoV-2 by FDA under an Emergency Use Authorization (EUA). This EUA will remain  in effect (meaning this test can be used) for the duration of the COVID-19 declaration under Section 564(b)(1) of the Act, 21 U.S.C.section 360bbb-3(b)(1), unless the authorization is terminated  or revoked sooner.       Influenza A by PCR NEGATIVE NEGATIVE   Influenza B by PCR NEGATIVE NEGATIVE    Comment: (NOTE) The Xpert Xpress SARS-CoV-2/FLU/RSV plus assay is intended as an aid in the diagnosis of influenza from Nasopharyngeal swab specimens and should not be used as a sole basis for treatment. Nasal washings and aspirates are unacceptable for Xpert Xpress SARS-CoV-2/FLU/RSV testing.  Fact Sheet for Patients: EntrepreneurPulse.com.au  Fact Sheet for Healthcare Providers: IncredibleEmployment.be  This test is not yet approved or cleared by the [place] FDA and has been authorized for detection and/or diagnosis of SARS-CoV-2 by FDA under an Emergency Use Authorization (EUA). This EUA will remain in effect (meaning this test can be used) for the duration of the COVID-19 declaration under Section 564(b)(1) of the Act, 21 U.S.C. section 360bbb-3(b)(1), unless the authorization is terminated or revoked.  Performed at [facility], [address], [place], [place] [address]   CBG monitoring, ED     Status:  None   Collection Time: 07/13/21  7:52 PM  Result Value Ref Range   Glucose-Capillary 87 70 - 99 mg/dL    Comment: Glucose reference range applies only to samples taken after fasting for at least 8 hours.  Basic metabolic panel     Status: Abnormal   Collection Time: 07/13/21  7:58 PM  Result Value Ref Range   Sodium 132 (L) 135 - 145 mmol/L   Potassium 2.6 (LL) 3.5 - 5.1 mmol/L    Comment: CRITICAL RESULT CALLED TO, READ BACK BY AND VERIFIED WITH: RAGLAND,M ON 07/13/21 AT 2040 BY LOY,C    Chloride 90 (L) 98 - 111 mmol/L   CO2 30 22 - 32 mmol/L   Glucose, Bld 94 70 - 99 mg/dL    Comment: Glucose reference range applies only to samples taken after fasting for at least 8 hours.   BUN 18 8 - 23 mg/dL   Creatinine, Ser 1.27 (H) 0.61 - 1.24 mg/dL   Calcium 8.7 (L) 8.9 - 10.3 mg/dL   GFR, Estimated >60 >60 mL/min  Comment: (NOTE) Calculated using the CKD-EPI Creatinine Equation (2021)    Anion gap 12 5 - 15    Comment: Performed at [facility], [address], [place], [place] [address]  CBC     Status: Abnormal   Collection Time: 07/13/21  7:58 PM  Result Value Ref Range   WBC 1.7 (L) 4.0 - 10.5 K/uL    Comment: WHITE COUNT CONFIRMED ON SMEAR ALLN    RBC 3.89 (L) 4.22 - 5.81 MIL/uL   Hemoglobin 12.1 (L) 13.0 - 17.0 g/dL   HCT 37.3 (L) 39.0 - 52.0 %   MCV 95.9 80.0 - 100.0 fL   MCH 31.1 26.0 - 34.0 pg   MCHC 32.4 30.0 - 36.0 g/dL   RDW 15.2 11.5 - 15.5 %   Platelets  150 - 400 K/uL    PLATELET CLUMPS NOTED ON SMEAR, COUNT APPEARS DECREASED    Comment: SPECIMEN CHECKED FOR CLOTS Immature Platelet Fraction may be clinically indicated, consider ordering this additional test [id number] PLATELET CLUMPS NOTED ON SMEAR, UNABLE TO ESTIMATE PLATELETS APPEAR DECREASED    nRBC 0.0 0.0 - 0.2 %    Comment: Performed at [facility], [address], [place], [place] [address]  CK     Status: None   Collection Time: 07/13/21  7:58 PM  Result Value Ref Range   Total CK 94 49 - 397 U/L     Comment: Performed at [facility], [address] [place], [place] [address]  Lactic acid, plasma     Status: Abnormal   Collection Time: 07/13/21  7:58 PM  Result Value Ref Range   Lactic Acid, Venous 4.1 (HH) 0.5 - 1.9 mmol/L    Comment: CRITICAL RESULT CALLED TO, READ BACK BY AND VERIFIED WITH: WALKER,T ON 07/13/21 AT 2205 BY LOY,C Performed at [facility], [address], [place], [place] [address]   Protime-INR     Status: Abnormal   Collection Time: 07/13/21  7:58 PM  Result Value Ref Range   Prothrombin Time 16.8 (H) 11.4 - 15.2 seconds   INR 1.4 (H) 0.8 - 1.2    Comment: (NOTE) INR goal varies based on device and disease states. Performed at [facility], [address] [place], [place] [address]   APTT     Status: Abnormal   Collection Time: 07/13/21  7:58 PM  Result Value Ref Range   aPTT 43 (H) 24 - 36 seconds    Comment:        IF BASELINE aPTT IS ELEVATED, SUGGEST PATIENT RISK ASSESSMENT BE USED TO DETERMINE APPROPRIATE ANTICOAGULANT THERAPY. Performed at [facility], [address] [place], [place] [address]   Blood Culture (routine x 2)     Status: None (Preliminary result)   Collection Time: 07/13/21  7:58 PM   Specimen: Left Antecubital; Blood  Result Value Ref Range   Specimen Description LEFT ANTECUBITAL    Special Requests      BOTTLES DRAWN AEROBIC AND ANAEROBIC Blood Culture results may not be optimal due to an inadequate volume of blood received in culture bottles   Culture      NO GROWTH < 12 HOURS Performed at [facility], [address], [place], [place] [address]    Report Status PENDING   Blood Culture (routine x 2)     Status: None (Preliminary result)   Collection Time: 07/13/21  7:58 PM   Specimen: BLOOD RIGHT HAND  Result Value Ref Range   Specimen Description BLOOD RIGHT HAND  Special Requests      BOTTLES DRAWN AEROBIC ONLY Blood Culture results may not be optimal due to an inadequate volume of blood received in culture bottles    Culture      NO GROWTH < 12 HOURS Performed at [facility], [address], [place], [place] [address]    Report Status PENDING   Urinalysis, Routine w reflex microscopic     Status: Abnormal   Collection Time: 07/13/21  9:19 PM  Result Value Ref Range   Color, Urine AMBER (A) YELLOW    Comment: BIOCHEMICALS MAY BE AFFECTED BY COLOR   APPearance HAZY (A) CLEAR   Specific Gravity, Urine 1.014 1.005 - 1.030   pH 5.0 5.0 - 8.0   Glucose, UA NEGATIVE NEGATIVE mg/dL   Hgb urine dipstick SMALL (A) NEGATIVE   Bilirubin Urine NEGATIVE NEGATIVE   Ketones, ur NEGATIVE NEGATIVE mg/dL   Protein, ur 30 (A) NEGATIVE mg/dL   Nitrite NEGATIVE NEGATIVE   Leukocytes,Ua NEGATIVE NEGATIVE   RBC / HPF 0-5 0 - 5 RBC/hpf   WBC, UA 0-5 0 - 5 WBC/hpf   Bacteria, UA MANY (A) NONE SEEN   Squamous Epithelial / LPF 0-5 0 - 5   Mucus PRESENT    Hyaline Casts, UA PRESENT    Amorphous Crystal PRESENT     Comment: Performed at [facility], [address] [place], [place] [address]  Lactic acid, plasma     Status: Abnormal   Collection Time: 07/13/21 10:39 PM  Result Value Ref Range   Lactic Acid, Venous 3.3 (HH) 0.5 - 1.9 mmol/L    Comment: CRITICAL VALUE NOTED.  VALUE IS CONSISTENT WITH PREVIOUSLY REPORTED AND CALLED VALUE. Performed at [facility], [address], [place], [place] [address]   Differential     Status: Abnormal   Collection Time: 07/13/21 10:39 PM  Result Value Ref Range   Neutrophils Relative % 5 %   Neutro Abs 0.1 (LL) 1.7 - 7.7 K/uL    Comment: CRITICAL RESULT CALLED TO, READ BACK BY AND VERIFIED WITH: RAGLAND,M'@2335'$  BY MATTHEWS, B 3.9.23    Lymphocytes Relative 70 %   Lymphs Abs 0.9 0.7 - 4.0 K/uL   Monocytes Relative 15 %   Monocytes Absolute 0.2 0.1 - 1.0 K/uL   Eosinophils Relative 2 %   Eosinophils Absolute 0.0 0.0 - 0.5 K/uL   Basophils Relative 3 %   Basophils Absolute 0.0 0.0 - 0.1 K/uL   WBC Morphology ABSOLUTE LYMPHOCYTOSIS     Comment: MILD LEFT SHIFT (1-5% METAS,  OCC MYELO, OCC BANDS) SMUDGE CELLS    Myelocytes 1 %   Blasts 4 %   Reactive, Benign Lymphocytes PRESENT    Tear Drop Cells PRESENT    Polychromasia PRESENT     Comment: Performed at [facility] [address] [place], [place] [address]  C Difficile Quick Screen w PCR reflex     Status: None   Collection Time: 07/14/21  1:04 AM   Specimen: STOOL  Result Value Ref Range   C Diff antigen NEGATIVE NEGATIVE   C Diff toxin NEGATIVE NEGATIVE   C Diff interpretation No C. difficile detected.     Comment: Performed at [facility], [address], [place], [place] [address]  Comprehensive metabolic panel     Status: Abnormal   Collection Time: 07/14/21  6:13 AM  Result Value Ref Range   Sodium 135 135 - 145 mmol/L   Potassium 3.0 (L) 3.5 - 5.1 mmol/L  Chloride 98 98 - 111 mmol/L   CO2 26 22 - 32 mmol/L   Glucose, Bld 80 70 - 99 mg/dL    Comment: Glucose reference range applies only to samples taken after fasting for at least 8 hours.   BUN 15 8 - 23 mg/dL   Creatinine, Ser 0.91 0.61 - 1.24 mg/dL   Calcium 8.5 (L) 8.9 - 10.3 mg/dL   Total Protein 5.4 (L) 6.5 - 8.1 g/dL   Albumin 3.0 (L) 3.5 - 5.0 g/dL   AST 26 15 - 41 U/L   ALT 17 0 - 44 U/L   Alkaline Phosphatase 55 38 - 126 U/L   Total Bilirubin 1.4 (H) 0.3 - 1.2 mg/dL   GFR, Estimated >60 >60 mL/min    Comment: (NOTE) Calculated using the CKD-EPI Creatinine Equation (2021)    Anion gap 11 5 - 15    Comment: Performed at [facility], [address], [place], [place] [address]  Magnesium     Status: Abnormal   Collection Time: 07/14/21  6:13 AM  Result Value Ref Range   Magnesium 1.6 (L) 1.7 - 2.4 mg/dL    Comment: Performed at [facility], [address], [place], [place] [address]  Phosphorus     Status: None   Collection Time: 07/14/21  6:13 AM  Result Value Ref Range   Phosphorus 4.3 2.5 - 4.6 mg/dL    Comment: Performed at [facility], [address], [place], [place] [address]  Lactic acid, plasma     Status:  Abnormal   Collection Time: 07/14/21  6:13 AM  Result Value Ref Range   Lactic Acid, Venous 2.4 (HH) 0.5 - 1.9 mmol/L    Comment: CRITICAL VALUE NOTED.  VALUE IS CONSISTENT WITH PREVIOUSLY REPORTED AND CALLED VALUE. Performed at [facility], [address], [place], [place] [address]   Lactic acid, plasma     Status: Abnormal   Collection Time: 07/14/21  6:57 AM  Result Value Ref Range   Lactic Acid, Venous 2.3 (HH) 0.5 - 1.9 mmol/L    Comment: CRITICAL VALUE NOTED.  VALUE IS CONSISTENT WITH PREVIOUSLY REPORTED AND CALLED VALUE. Performed at [facility], [address], [place], [place] [address]   CBC     Status: Abnormal   Collection Time: 07/14/21  6:57 AM  Result Value Ref Range   WBC 0.8 (LL) 4.0 - 10.5 K/uL    Comment: REPEATED TO VERIFY WHITE COUNT CONFIRMED ON SMEAR THIS CRITICAL RESULT HAS VERIFIED AND BEEN CALLED TO WHITE M BY LATISHA HENDERSON ON 03 10 2023 AT 0744, AND HAS BEEN READ BACK.     RBC 3.88 (L) 4.22 - 5.81 MIL/uL   Hemoglobin 12.2 (L) 13.0 - 17.0 g/dL   HCT 36.9 (L) 39.0 - 52.0 %   MCV 95.1 80.0 - 100.0 fL   MCH 31.4 26.0 - 34.0 pg   MCHC 33.1 30.0 - 36.0 g/dL   RDW 15.2 11.5 - 15.5 %   Platelets 36 (L) 150 - 400 K/uL    Comment: SPECIMEN CHECKED FOR CLOTS Immature Platelet Fraction may be clinically indicated, consider ordering this additional test [id number] PLATELET COUNT CONFIRMED BY SMEAR    nRBC 0.0 0.0 - 0.2 %    Comment: Performed at [facility], [address], [place], [place] [address]      RADIOGRAPHY: DG Chest Port 1 View  Result Date: 07/13/2021 CLINICAL DATA:  Possible sepsis, weakness EXAM: PORTABLE CHEST 1 VIEW COMPARISON:  05/12/2021 FINDINGS: Right-sided central  venous port tip over the SVC. No focal opacity, pleural effusion or pneumothorax. Borderline cardiomegaly. IMPRESSION: No active disease. Electronically Signed   By: Donavan Foil M.D.   On: 07/13/2021 21:06       ASSESSMENT and PLAN:  1.  Stage IVb diffuse large B-cell  lymphoma: - Interim PET CT scan after starting of treatment showed complete response of lymphoma. - Cycle 5 of R-CHOP on 07/05/2021 followed by pegylated G-CSF on 07/07/2021.  2.  Neutropenic fever: - Presentation with temperature of 102.  Blood cultures and urine culture are pending.  Chest x-ray was negative. - Continue broad-spectrum antibiotics.  He has been afebrile this morning. - Follow-up on cultures. - He already received Neulasta on 07/07/2021.  His white count will be trending up in the next day or 2. - Patient complains of slight pain in the jaw area.  If there is any evolving redness or swelling, consider CT of the sinuses.  3.  Diarrhea: - C. difficile has been negative.  GI panel is pending. - Consider Imodium as needed.  4.  Thrombocytopenia: - Platelet count today is 36.  No active bleeding. - Thrombocytopenia from combination of chemotherapy and sepsis. - Transfuse if platelet count less than 20 or active bleeding.  All questions were answered. The patient knows to call the clinic with any problems, questions or concerns. We can certainly see the patient much sooner if necessary.   Derek Jack

## 2021-07-14 NOTE — ED Notes (Signed)
Got pt off of bedpan , got him repositioned and some petroleum jelly for his lips ?

## 2021-07-14 NOTE — Assessment & Plan Note (Deleted)
Na 132; continue IV hydration ?

## 2021-07-14 NOTE — H&P (Addendum)
History and Physical    Patient: Roy [name] JME:[id number] DOB: 06/20/1950 DOA: 07/13/2021 DOS: the patient was seen and examined on 07/14/2021 PCP: Bridget Hartshorn, NP  Patient coming from: Home  Chief Complaint:  Chief Complaint  Patient presents with   Weakness   HPI: Roy [name] is a 71 y.o. male with medical history significant of diffuse large B-cell lymphoma (follows with Dr. Lesly Dukes oncology) with last follow-up being on 3/1, hypothyroidism, hypotension, GERD, hyperlipidemia, BPH who presents to the emergency department via EMS due to weakness and diarrhea.  Patient states that he was on the floor since 11:00 AM, he was later found on the floor covered in diarrhea by son.  EMS was called and patient was taken to the ED for further evaluation and management.  ED course: In the emergency department, patient was febrile with a temperature of 102F, intermittently tachypneic, BP 91/49.  Work-up in ED showed WBC 1.7, hypokalemia, hyponatremia, lactic acid 4.1 > 3.3, urinalysis was unimpressive for UTI.  Influenza A, B, SARS coronavirus 2 was negative. Chest x-ray showed no active disease Patient was treated with IV cefepime and vancomycin, Flagyl was given, midodrine was started and IV hydration was provided.  Oncology was consulted and recommended admitting patient per ED physician.  Hospitalist was asked to admit patient for further evaluation and management.   Review of Systems: As mentioned in the history of present illness. All other systems reviewed and are negative.  Past Medical History:  Diagnosis Date   Atrial fibrillation (HCC)    GERD (gastroesophageal reflux disease)    High cholesterol    Hypertension    Obesity    Past Surgical History:  Procedure Laterality Date   APPENDECTOMY     CATARACT EXTRACTION Bilateral 2022   CHOLECYSTECTOMY     ESOPHAGEAL DILATION     multiple times   IR IMAGING GUIDED PORT INSERTION  03/31/2021   JOINT  REPLACEMENT Left    hip   Social History:  reports that he has never smoked. He has never used smokeless tobacco. He reports that he does not drink alcohol and does not use drugs.  Allergies  Allergen Reactions   Adhesive [Tape]     Contact dermatitis   Sulfa Antibiotics     UNKNOWN Childhood reaction    Family History  Problem Relation Age of Onset   Heart failure Father    Heart attack Father        Deceased    Thyroid disease Sister     Prior to Admission medications   Medication Sig Start Date End Date Taking? Authorizing Provider  Calcium Carbonate-Vitamin D (CALCIUM-VITAMIN D) 600-125 MG-UNIT TABS Take by mouth.   Yes [provider]  Cholecalciferol (VITAMIN D3) 25 MCG (1000 UT) CAPS Take by mouth daily.   Yes [provider]  CYCLOPHOSPHAMIDE IV Inject into the vein every 21 ( twenty-one) days. 04/12/21  Yes [provider]  DOXORUBICIN HCL IV Inject into the vein every 21 ( twenty-one) days. 04/12/21  Yes [provider]  EQUATE STOOL SOFTENER 100 MG capsule Take 2 capsules by mouth twice daily 06/08/21  Yes Pennington, Rebekah M, PA-C  ferrous sulfate 325 (65 FE) MG EC tablet Take 1 tablet by mouth once daily with breakfast 06/16/21  Yes Pennington, Rebekah M, PA-C  fludrocortisone (FLORINEF) 0.1 MG tablet Take 1 tablet (0.1 mg total) by mouth daily with breakfast. 06/05/21  Yes Nida, Marella Chimes, MD  gabapentin (NEURONTIN) 300 MG capsule Take  1 capsule (300 mg total) by mouth 2 (two) times daily. 07/05/21  Yes Derek Jack, MD  Lactulose 20 GM/30ML SOLN Take 30 ml by mouth every 3 hours until a bowel movement occurs.  Then take 30 ml by mouth at bedtime. Patient taking differently: Take 30 mLs by mouth See admin instructions. Take 30 ml by mouth every 3 hours until a bowel movement occurs.  Then take 30 ml by mouth at bedtime. 07/13/21  Yes Derek Jack, MD  levothyroxine (SYNTHROID) 88 MCG tablet Take 1 tablet (88 mcg total)  by mouth daily before breakfast. 06/05/21  Yes Nida, Marella Chimes, MD  magnesium oxide (MAG-OX) 400 (240 Mg) MG tablet Take 1 tablet (400 mg total) by mouth 2 (two) times daily. 10/24/20  Yes Tat, Shanon Brow, MD  midodrine (PROAMATINE) 5 MG tablet TAKE 2 TABLETS EACH MORNING, 1 TABLET EACH AFTERNOON AND 1 TABLET EACH EVENING. Patient taking differently: 5 mg 3 (three) times daily with meals. 05/15/21  Yes Nida, Marella Chimes, MD  omeprazole (PRILOSEC) 10 MG capsule Take 1 capsule by mouth daily. 11/15/20  Yes [provider]  potassium chloride (KLOR-CON) 10 MEQ tablet Take 10 mEq by mouth 2 (two) times daily. 11/04/20  Yes [provider]  predniSONE (DELTASONE) 10 MG tablet Take 1 tablet (10 mg total) by mouth daily with breakfast. 03/02/21  Yes Nida, Marella Chimes, MD  predniSONE (DELTASONE) 20 MG tablet Take 100 mg (5 tablets) by mouth daily starting on each day of chemotherapy treatment, then 4 days after.  Repeat every 3 weeks. Do not take on the weeks you are not receiving treatment. 04/07/21  Yes Derek Jack, MD  riTUXimab in sodium chloride 0.9 % 250 mL Inject into the vein every 21 ( twenty-one) days. 04/12/21  Yes [provider]  rosuvastatin (CRESTOR) 5 MG tablet Take 1 tablet by mouth at bedtime. 08/23/20  Yes [provider]  tamsulosin (FLOMAX) 0.4 MG CAPS capsule Take 1 capsule by mouth daily. 02/28/21  Yes [provider]  VINCRISTINE SULFATE IV Inject into the vein every 21 ( twenty-one) days. 04/12/21  Yes [provider]  vitamin B-12 (CYANOCOBALAMIN) 1000 MCG tablet Take 1 tablet (1,000 mcg total) by mouth daily. 02/28/21  Yes Pennington, Rebekah M, PA-C  warfarin (COUMADIN) 5 MG tablet Take 1 tablet (5 mg total) by mouth every evening. Patient taking differently: Take 5 mg by mouth See admin instructions. Take 10 mg on Tuesday, Thursday and Saturday and all other days take 7.5 01/15/21  Yes Emokpae, Courage, MD  acetaminophen  (TYLENOL) 325 MG tablet Take 2 tablets (650 mg total) by mouth every 6 (six) hours as needed for headache. Patient not taking: Reported on 07/07/2021 01/13/21   Roxan Hockey, MD  benzonatate (TESSALON) 200 MG capsule Take 1 capsule (200 mg total) by mouth 3 (three) times daily as needed for cough. Patient not taking: Reported on 07/07/2021 06/26/21   Volney American, PA-C  guaiFENesin (MUCINEX) 600 MG 12 hr tablet Take 1 tablet (600 mg total) by mouth 2 (two) times daily as needed. Patient not taking: Reported on 07/13/2021 06/26/21   Volney American, PA-C  lidocaine (XYLOCAINE) 2 % jelly Apply 1 application. topically as needed (pain). 01/16/21 01/16/22  [provider]  lidocaine-prilocaine (EMLA) cream Apply a small amount to port a cath site and cover with plastic wrap 1 hour prior to chemotherapy appointments 04/07/21   Derek Jack, MD  prednisoLONE acetate (PRED FORTE) 1 % ophthalmic suspension Place into  the left eye. Patient not taking: Reported on 07/13/2021 04/14/21   [provider]  prochlorperazine (COMPAZINE) 10 MG tablet Take 1 tablet (10 mg total) by mouth every 6 (six) hours as needed (Nausea or vomiting). Patient not taking: Reported on 07/13/2021 04/07/21   Derek Jack, MD  psyllium (REGULOID) 0.52 g capsule Take by mouth. Patient not taking: Reported on 07/13/2021 01/16/21 01/16/22  [provider]    Physical Exam: Vitals:   07/14/21 0245 07/14/21 0300 07/14/21 0315 07/14/21 0330  BP: 100/61 105/65 100/62 113/69  Pulse: 69 69 66 78  Resp: 20 19 18 19   Temp:      TempSrc:      SpO2: 99% 99% 99% 99%  Weight:      Height:       General: Elderly male. Awake and alert and oriented x3. Not in any acute distress.  HEENT: NCAT.  PERRLA. EOMI. Sclerae anicteric.  Moist mucosal membranes. Neck: Neck supple without lymphadenopathy. No carotid bruits. No masses palpated.  Cardiovascular: Tachycardia.  Regular rate with normal S1-S2  sounds. No murmurs, rubs or gallops auscultated.   Respiratory: Tachypneic.  Clear breath sounds.  No accessory muscle use. Abdomen: Soft, nontender, nondistended. Active bowel sounds. No masses or hepatosplenomegaly  Skin: No rashes, lesions, or ulcerations.  Dry, warm to touch. Musculoskeletal:  2+ dorsalis pedis and radial pulses. Good ROM.  No contractures  Psychiatric: Intact judgment and insight.  Mood appropriate to current condition. Neurologic: No focal neurological deficits. Strength is 5/5 x 4.  CN II - XII grossly intact.  Data Reviewed: EKG personally reviewed showed sinus tachycardia at rate of 109 bpm with QTc 577 ms  Assessment and Plan: * Sepsis due to undetermined organism [facility]) Patient met sepsis criteria due to being febrile, tachypneic and neutropenic (met SIRS criteria), source of infection currently unknown Lactic acid was 4.1 > 3.3 Continue IV vancomycin and cefepime Continue IV hydration Continue Tylenol as needed for fever Blood culture pending  Lactic acidosis Lactic acid 4.1 > 3.3 Continue to trend lactic acid  Neutropenic fever (HCC) Continue Tylenol as needed Continue management as described above for sepsis  Diarrhea Diarrhea was negative for C.Diff antigen and toxin GI stool panel will be checked  Hypotension Continue midodrine per home regimen  DLBCL (diffuse large B cell lymphoma) ([place]) Patient follows with Dr. Raliegh Ip (hematology oncology) with last office visit being on 3/1   Hyponatremia Na 132; continue IV hydration  Hypokalemia K+ is 2.6 K+ will be replenished Please monitor for AM K+ for further replenishmemnt   GERD (gastroesophageal reflux disease) Continue Protonix  BPH (benign prostatic hyperplasia) Continue Flomax  Hyperlipidemia Continue Crestor  Hypothyroidism Continue Synthroid    Advance Care Planning:   Code Status: Full Code   Consults: Hematology/oncology  Family Communication: None at bedside  Severity  of Illness: The appropriate patient status for this patient is INPATIENT. Inpatient status is judged to be reasonable and necessary in order to provide the required intensity of service to ensure the patient's safety. The patient's presenting symptoms, physical exam findings, and initial radiographic and laboratory data in the context of their chronic comorbidities is felt to place them at high risk for further clinical deterioration. Furthermore, it is not anticipated that the patient will be medically stable for discharge from the hospital within 2 midnights of admission.   * I certify that at the point of admission it is my clinical judgment that the patient will require inpatient hospital care spanning beyond  2 midnights from the point of admission due to high intensity of service, high risk for further deterioration and high frequency of surveillance required.*  Author: Bernadette Hoit, DO 07/14/2021 4:03 AM  For on call review [email or web address].

## 2021-07-14 NOTE — Assessment & Plan Note (Addendum)
Continue Tylenol as needed ?Continue management as described above for sepsis ?Received Neulasta 3/3 ?

## 2021-07-14 NOTE — ED Notes (Signed)
Lab at bedside

## 2021-07-14 NOTE — Progress Notes (Signed)
Per HPI: ?Roy [name] is a 71 y.o. male with medical history significant of diffuse large B-cell lymphoma (follows with Dr. Lesly Dukes oncology) with last follow-up being on 3/1, hypothyroidism, hypotension, GERD, hyperlipidemia, BPH who presents to the emergency department via EMS due to weakness and diarrhea.  Patient states that he was on the floor since 11:00 AM, he was later found on the floor covered in diarrhea by son.  EMS was called and patient was taken to the ED for further evaluation and management. ? ?07/14/21: Patient has been admitted with neutropenic fever in the setting of diffuse large B-cell lymphoma.  He has been seen by oncology and has been empirically initiated on IV vancomycin and cefepime with cultures pending.  No obvious source of infection noted at this time.  Continue aggressive IV fluid hydration.  Neulasta last administered 3/3 and therefore, patient should have improvement in WBC count soon.  Patient will have potassium and magnesium repleted. ? ?Total care time: 35 minutes. ?

## 2021-07-14 NOTE — ED Notes (Signed)
Patient back on bedpain ?

## 2021-07-14 NOTE — Plan of Care (Signed)

## 2021-07-14 NOTE — Assessment & Plan Note (Addendum)
resolved 

## 2021-07-14 NOTE — Assessment & Plan Note (Addendum)
?  K+ will be replenished ?Please monitor for AM K+ for further replenishmemnt ? ?

## 2021-07-14 NOTE — Assessment & Plan Note (Signed)
Continue Protonix °

## 2021-07-14 NOTE — Assessment & Plan Note (Signed)
Continue midodrine per home regimen ?

## 2021-07-14 NOTE — ED Notes (Signed)
EDP made aware of BP. 

## 2021-07-14 NOTE — ED Notes (Signed)
Phone, wallet, med list, and medical alert band placed in pt belongings bag and given to pt.  ?

## 2021-07-14 NOTE — Assessment & Plan Note (Addendum)
Patient met sepsis criteria due to being febrile, tachypneic and neutropenic (met SIRS criteria), source of infection possibly due to cellulitis of face/neck ?Lactic acid resolved ?Continue IV cefepime and vanc DC ?Continue IV hydration ?Continue Tylenol as needed for fever ?Blood cultures negative ?

## 2021-07-14 NOTE — ED Notes (Signed)
Removed pt from bedpan ,emptied urinal and set the head of the bed up  ?

## 2021-07-14 NOTE — Assessment & Plan Note (Signed)
Continue Synthroid °

## 2021-07-14 NOTE — ED Notes (Addendum)
Lab at bedside ?Lab unable to draw labs at this time ?

## 2021-07-14 NOTE — ED Notes (Signed)
Got pt glass of ice water and a green sponge to wipe out mouth ?

## 2021-07-14 NOTE — ED Notes (Signed)
Patient had another bowel movement, patient was soiled so brief and chuk changed ?

## 2021-07-14 NOTE — Assessment & Plan Note (Signed)
Patient follows with Dr. Raliegh Ip (hematology oncology) with last office visit being on 3/1 ? ?

## 2021-07-15 ENCOUNTER — Inpatient Hospital Stay (HOSPITAL_COMMUNITY): Payer: Medicare Other

## 2021-07-15 LAB — CBC WITH DIFFERENTIAL/PLATELET
Band Neutrophils: 2 %
Basophils Absolute: 0 10*3/uL (ref 0.0–0.1)
Basophils Relative: 3 %
Eosinophils Absolute: 0 10*3/uL (ref 0.0–0.5)
Eosinophils Relative: 5 %
HCT: 31.4 % — ABNORMAL LOW (ref 39.0–52.0)
Hemoglobin: 10 g/dL — ABNORMAL LOW (ref 13.0–17.0)
Lymphocytes Relative: 61 %
Lymphs Abs: 0.5 10*3/uL — ABNORMAL LOW (ref 0.7–4.0)
MCH: 30 pg (ref 26.0–34.0)
MCHC: 31.8 g/dL (ref 30.0–36.0)
MCV: 94.3 fL (ref 80.0–100.0)
Metamyelocytes Relative: 5 %
Monocytes Absolute: 0.1 10*3/uL (ref 0.1–1.0)
Monocytes Relative: 8 %
Myelocytes: 1 %
Neutro Abs: 0.2 10*3/uL — CL (ref 1.7–7.7)
Neutrophils Relative %: 15 %
Platelets: 40 10*3/uL — ABNORMAL LOW (ref 150–400)
RBC: 3.33 MIL/uL — ABNORMAL LOW (ref 4.22–5.81)
RDW: 14.8 % (ref 11.5–15.5)
WBC: 0.9 10*3/uL — CL (ref 4.0–10.5)
nRBC: 0 % (ref 0.0–0.2)

## 2021-07-15 LAB — BASIC METABOLIC PANEL
Anion gap: 8 (ref 5–15)
BUN: 8 mg/dL (ref 8–23)
CO2: 32 mmol/L (ref 22–32)
Calcium: 8.8 mg/dL — ABNORMAL LOW (ref 8.9–10.3)
Chloride: 96 mmol/L — ABNORMAL LOW (ref 98–111)
Creatinine, Ser: 0.71 mg/dL (ref 0.61–1.24)
GFR, Estimated: >60 mL/min (ref >60)
Glucose, Bld: 82 mg/dL (ref 70–99)
Potassium: 3.2 mmol/L — ABNORMAL LOW (ref 3.5–5.1)
Sodium: 136 mmol/L (ref 135–145)

## 2021-07-15 LAB — URINE CULTURE

## 2021-07-15 LAB — LACTIC ACID, PLASMA: Lactic Acid, Venous: 1.5 mmol/L (ref 0.5–1.9)

## 2021-07-15 LAB — MAGNESIUM: Magnesium: 1.5 mg/dL — ABNORMAL LOW (ref 1.7–2.4)

## 2021-07-15 MED ORDER — IOHEXOL 300 MG/ML  SOLN
75.0000 mL | Freq: Once | INTRAMUSCULAR | Status: AC | PRN
  Administered 2021-07-15: 75 mL via INTRAVENOUS

## 2021-07-15 MED ORDER — MAGNESIUM SULFATE 4 GM/100ML IV SOLN
4.0000 g | Freq: Once | INTRAVENOUS | Status: AC
  Administered 2021-07-15: 4 g via INTRAVENOUS
  Filled 2021-07-15: qty 100

## 2021-07-15 MED ORDER — OXYCODONE HCL 5 MG PO TABS
5.0000 mg | ORAL_TABLET | Freq: Four times a day (QID) | ORAL | Status: DC | PRN
  Administered 2021-07-15 – 2021-07-17 (×4): 5 mg via ORAL
  Filled 2021-07-15 (×4): qty 1

## 2021-07-15 MED ORDER — FLUDROCORTISONE ACETATE 0.1 MG PO TABS
0.1000 mg | ORAL_TABLET | Freq: Every day | ORAL | Status: DC
  Administered 2021-07-16 – 2021-07-17 (×2): 0.1 mg via ORAL
  Filled 2021-07-15 (×2): qty 1

## 2021-07-15 MED ORDER — POTASSIUM CHLORIDE CRYS ER 20 MEQ PO TBCR
40.0000 meq | EXTENDED_RELEASE_TABLET | Freq: Two times a day (BID) | ORAL | Status: AC
  Administered 2021-07-15 (×2): 40 meq via ORAL
  Filled 2021-07-15 (×2): qty 2

## 2021-07-15 MED ORDER — PREDNISONE 20 MG PO TABS
10.0000 mg | ORAL_TABLET | Freq: Every day | ORAL | Status: DC
  Administered 2021-07-15 – 2021-07-17 (×3): 10 mg via ORAL
  Filled 2021-07-15 (×3): qty 1

## 2021-07-15 MED ORDER — GABAPENTIN 300 MG PO CAPS
300.0000 mg | ORAL_CAPSULE | Freq: Two times a day (BID) | ORAL | Status: DC
  Administered 2021-07-15 – 2021-07-17 (×5): 300 mg via ORAL
  Filled 2021-07-15 (×5): qty 1

## 2021-07-15 NOTE — Progress Notes (Signed)
Lab called critical result WBC- 0.9, Neut#- 0.2. MD Manuella Ghazi made aware.  ?

## 2021-07-15 NOTE — Progress Notes (Signed)
PROGRESS NOTE    Roy [name]  IOM:[id number] DOB: July 05, 1950 DOA: 07/13/2021 PCP: Bridget Hartshorn, NP   Brief Narrative:  Per HPI: Roy [name] is a 71 y.o. male with medical history significant of diffuse large B-cell lymphoma (follows with Dr. Lesly Dukes oncology) with last follow-up being on 3/1, hypothyroidism, hypotension, GERD, hyperlipidemia, BPH who presents to the emergency department via EMS due to weakness and diarrhea.  Patient states that he was on the floor since 11:00 AM, he was later found on the floor covered in diarrhea by son.  EMS was called and patient was taken to the ED for further evaluation and management.   07/14/21: Patient has been admitted with neutropenic fever in the setting of diffuse large B-cell lymphoma.  He has been seen by oncology and has been empirically initiated on IV vancomycin and cefepime with cultures pending.  No obvious source of infection noted at this time.  Continue aggressive IV fluid hydration.  Neulasta last administered 3/3 and therefore, patient should have improvement in WBC count soon.  Patient will have potassium and magnesium repleted.  07/15/21: Patient continues to complain of right lower face and neck swelling/pain.  CT maxillofacial with findings of cellulitis to this region with no abscess.  Continue repletion of potassium and magnesium.  Diarrhea improved.  Continue current antibiotics.  WBC count still low.    Assessment & Plan:   Principal Problem:   Sepsis due to undetermined organism [facility]) Active Problems:   Neutropenic fever (HCC)   Lactic acidosis   Hypokalemia   Hyponatremia   Large B-cell lymphoma (HCC)   Hypotension   Diarrhea   Hypothyroidism   Hyperlipidemia   BPH (benign prostatic hyperplasia)   GERD (gastroesophageal reflux disease)  Assessment and Plan: * Sepsis due to undetermined organism [facility]) Patient met sepsis criteria due to being febrile, tachypneic and neutropenic (met SIRS  criteria), source of infection possibly due to cellulitis of face/neck Lactic acid resolved Continue IV vancomycin and cefepime Continue IV hydration Continue Tylenol as needed for fever Blood cultures negative  Lactic acidosis resolved  Neutropenic fever (HCC) Continue Tylenol as needed Continue management as described above for sepsis Received Neulasta 3/3  Diarrhea Diarrhea was negative for C.Diff antigen and toxin GI stool panel negative Imodium prn Diarrhea is improving  Hypotension Continue midodrine per home regimen  Large B-cell lymphoma (HCC) Patient follows with Dr. Raliegh Ip (hematology oncology) with last office visit being on 3/1   Hypokalemia  K+ will be replenished Please monitor for AM K+ for further replenishmemnt   GERD (gastroesophageal reflux disease) Continue Protonix  BPH (benign prostatic hyperplasia) Continue Flomax  Hyperlipidemia Continue Crestor  Hypothyroidism Continue Synthroid   DVT prophylaxis: Holding Coumadin (for afib) due to thrombocytopenia Code Status: Full Family Communication: None at bedside Disposition Plan:  Status is: Inpatient Remains inpatient appropriate because: IV medications   Nutritional Assessment:  The patients BMI is: Body mass index is 36.2 kg/m.Marland Kitchen  Seen by dietician.  I agree with the assessment and plan as outlined below:  Nutrition Status:       Consultants:  Heme-Onc  Procedures:  See below  Antimicrobials:  Anti-infectives (From admission, onward)    Start     Dose/Rate Route Frequency Ordered Stop   07/14/21 2200  vancomycin (VANCOREADY) IVPB 1250 mg/250 mL  Status:  Discontinued        1,250 mg 166.7 mL/hr over 90 Minutes Intravenous Every 24 hours 07/13/21 2120 07/14/21 0837   07/14/21 2200  vancomycin (VANCOREADY) IVPB 1500 mg/300 mL        1,500 mg 150 mL/hr over 120 Minutes Intravenous Every 24 hours 07/14/21 0837     07/14/21 0500  ceFEPIme (MAXIPIME) 2 g in sodium chloride 0.9  % 100 mL IVPB        2 g 200 mL/hr over 30 Minutes Intravenous Every 8 hours 07/13/21 2112     07/13/21 2115  vancomycin (VANCOREADY) IVPB 1500 mg/300 mL        1,500 mg 150 mL/hr over 120 Minutes Intravenous  Once 07/13/21 2112 07/14/21 0101   07/13/21 2045  ceFEPIme (MAXIPIME) 2 g in sodium chloride 0.9 % 100 mL IVPB        2 g 200 mL/hr over 30 Minutes Intravenous  Once 07/13/21 2044 07/13/21 2141   07/13/21 2045  metroNIDAZOLE (FLAGYL) IVPB 500 mg        500 mg 100 mL/hr over 60 Minutes Intravenous  Once 07/13/21 2044 07/13/21 2215   07/13/21 2045  vancomycin (VANCOCIN) IVPB 1000 mg/200 mL premix  Status:  Discontinued        1,000 mg 200 mL/hr over 60 Minutes Intravenous  Once 07/13/21 2044 07/13/21 2112      Subjective: Patient seen and evaluated today with ongoing complaints of right-sided jaw swelling and pain.  Diarrhea has improved.  Objective: Vitals:   07/14/21 1757 07/14/21 2149 07/15/21 0204 07/15/21 0300  BP: (!) 100/56 91/60 (!) 97/51 (!) 109/59  Pulse: 81 90 78 72  Resp: 18 18 18 19   Temp: 98.8 F (37.1 C) 99.2 F (37.3 C) 98.6 F (37 C) 98.8 F (37.1 C)  TempSrc: Oral Oral Oral Oral  SpO2: 100% 91% 99% 100%  Weight:      Height:        Intake/Output Summary (Last 24 hours) at 07/15/2021 1143 Last data filed at 07/15/2021 0900 Gross per 24 hour  Intake 2602.4 ml  Output 2100 ml  Net 502.4 ml   Filed Weights   07/13/21 1842 07/13/21 1852  Weight: 107.5 kg 108 kg    Examination:  General exam: Appears calm and comfortable  Respiratory system: Clear to auscultation. Respiratory effort normal.  On 2 L nasal cannula Cardiovascular system: S1 & S2 heard, RRR.  Gastrointestinal system: Abdomen is soft Central nervous system: Alert and awake Extremities: No edema Skin: No significant lesions noted Psychiatry: Flat affect.    Data Reviewed: I have personally reviewed following labs and imaging studies  CBC: Recent Labs  Lab 07/13/21 1958  07/13/21 2239 07/14/21 0657 07/15/21 0456  WBC 1.7*  --  0.8* 0.9*  NEUTROABS  --  0.1*  --  0.2*  HGB 12.1*  --  12.2* 10.0*  HCT 37.3*  --  36.9* 31.4*  MCV 95.9  --  95.1 94.3  PLT PLATELET CLUMPS NOTED ON SMEAR, COUNT APPEARS DECREASED  --  36* 40*   Basic Metabolic Panel: Recent Labs  Lab 07/13/21 1958 07/14/21 0613 07/15/21 0456  NA 132* 135 136  K 2.6* 3.0* 3.2*  CL 90* 98 96*  CO2 30 26 32  GLUCOSE 94 80 82  BUN 18 15 8   CREATININE 1.27* 0.91 0.71  CALCIUM 8.7* 8.5* 8.8*  MG  --  1.6* 1.5*  PHOS  --  4.3  --    GFR: Estimated Creatinine Clearance: 100.9 mL/min (by C-G formula based on SCr of 0.71 mg/dL). Liver Function Tests: Recent Labs  Lab 07/14/21 0613  AST 26  ALT 17  ALKPHOS 55  BILITOT 1.4*  PROT 5.4*  ALBUMIN 3.0*   No results for input(s): LIPASE, AMYLASE in the last 168 hours. No results for input(s): AMMONIA in the last 168 hours. Coagulation Profile: Recent Labs  Lab 07/13/21 1958  INR 1.4*   Cardiac Enzymes: Recent Labs  Lab 07/13/21 1958  CKTOTAL 94   BNP (last 3 results) No results for input(s): PROBNP in the last 8760 hours. HbA1C: No results for input(s): HGBA1C in the last 72 hours. CBG: Recent Labs  Lab 07/13/21 1952  GLUCAP 87   Lipid Profile: No results for input(s): CHOL, HDL, LDLCALC, TRIG, CHOLHDL, LDLDIRECT in the last 72 hours. Thyroid Function Tests: No results for input(s): TSH, T4TOTAL, FREET4, T3FREE, THYROIDAB in the last 72 hours. Anemia Panel: No results for input(s): VITAMINB12, FOLATE, FERRITIN, TIBC, IRON, RETICCTPCT in the last 72 hours. Sepsis Labs: Recent Labs  Lab 07/13/21 2239 07/14/21 0613 07/14/21 0657 07/15/21 0457  LATICACIDVEN 3.3* 2.4* 2.3* 1.5    Recent Results (from the past 240 hour(s))  Resp Panel by RT-PCR (Flu A&B, Covid) Nasopharyngeal Swab     Status: None   Collection Time: 07/13/21  7:00 PM   Specimen: Nasopharyngeal Swab; Nasopharyngeal(NP) swabs in vial transport  medium  Result Value Ref Range Status   SARS Coronavirus 2 by RT PCR NEGATIVE NEGATIVE Final    Comment: (NOTE) SARS-CoV-2 target nucleic acids are NOT DETECTED.  The SARS-CoV-2 RNA is generally detectable in upper respiratory specimens during the acute phase of infection. The lowest concentration of SARS-CoV-2 viral copies this assay can detect is 138 copies/mL. A negative result does not preclude SARS-Cov-2 infection and should not be used as the sole basis for treatment or other patient management decisions. A negative result may occur with  improper specimen collection/handling, submission of specimen other than nasopharyngeal swab, presence of viral mutation(s) within the areas targeted by this assay, and inadequate number of viral copies(<138 copies/mL). A negative result must be combined with clinical observations, patient history, and epidemiological information. The expected result is Negative.  Fact Sheet for Patients:  EntrepreneurPulse.com.au  Fact Sheet for Healthcare Providers:  IncredibleEmployment.be  This test is no t yet approved or cleared by the [place] FDA and  has been authorized for detection and/or diagnosis of SARS-CoV-2 by FDA under an Emergency Use Authorization (EUA). This EUA will remain  in effect (meaning this test can be used) for the duration of the COVID-19 declaration under Section 564(b)(1) of the Act, 21 U.S.C.section 360bbb-3(b)(1), unless the authorization is terminated  or revoked sooner.       Influenza A by PCR NEGATIVE NEGATIVE Final   Influenza B by PCR NEGATIVE NEGATIVE Final    Comment: (NOTE) The Xpert Xpress SARS-CoV-2/FLU/RSV plus assay is intended as an aid in the diagnosis of influenza from Nasopharyngeal swab specimens and should not be used as a sole basis for treatment. Nasal washings and aspirates are unacceptable for Xpert Xpress SARS-CoV-2/FLU/RSV testing.  Fact Sheet for  Patients: EntrepreneurPulse.com.au  Fact Sheet for Healthcare Providers: IncredibleEmployment.be  This test is not yet approved or cleared by the [place] FDA and has been authorized for detection and/or diagnosis of SARS-CoV-2 by FDA under an Emergency Use Authorization (EUA). This EUA will remain in effect (meaning this test can be used) for the duration of the COVID-19 declaration under Section 564(b)(1) of the Act, 21 U.S.C. section 360bbb-3(b)(1), unless the authorization is terminated or revoked.  Performed at [facility], [address], [place],  [place] [address]   Blood Culture (routine x 2)     Status: None (Preliminary result)   Collection Time: 07/13/21  7:58 PM   Specimen: Left Antecubital; Blood  Result Value Ref Range Status   Specimen Description LEFT ANTECUBITAL  Final   Special Requests   Final    BOTTLES DRAWN AEROBIC AND ANAEROBIC Blood Culture results may not be optimal due to an inadequate volume of blood received in culture bottles   Culture   Final    NO GROWTH 2 DAYS Performed at [facility], [address] [place], [place] [address]    Report Status PENDING  Incomplete  Blood Culture (routine x 2)     Status: None (Preliminary result)   Collection Time: 07/13/21  7:58 PM   Specimen: BLOOD RIGHT HAND  Result Value Ref Range Status   Specimen Description BLOOD RIGHT HAND  Final   Special Requests   Final    BOTTLES DRAWN AEROBIC ONLY Blood Culture results may not be optimal due to an inadequate volume of blood received in culture bottles   Culture   Final    NO GROWTH 2 DAYS Performed at [facility], [address], [place], [place] [address]    Report Status PENDING  Incomplete  Urine Culture     Status: Abnormal   Collection Time: 07/13/21  9:19 PM   Specimen: Urine, Clean Catch  Result Value Ref Range Status   Specimen Description   Final    URINE, CLEAN CATCH Performed at [facility], [address], [place], [place] [address]    Special Requests   Final    NONE Performed at [facility], [address] [place], [place] [address]    Culture MULTIPLE SPECIES PRESENT, SUGGEST RECOLLECTION (A)  Final   Report Status 07/15/2021 FINAL  Final  C Difficile Quick Screen w PCR reflex     Status: None   Collection Time: 07/14/21  1:04 AM   Specimen: STOOL  Result Value Ref Range Status   C Diff antigen NEGATIVE NEGATIVE Final   C Diff toxin NEGATIVE NEGATIVE Final   C Diff interpretation No C. difficile detected.  Final    Comment: Performed at [facility], [address], [place], [place] [address]  Gastrointestinal Panel by PCR , Stool     Status: None   Collection Time: 07/14/21  1:04 AM   Specimen: Stool  Result Value Ref Range Status   Campylobacter species NOT DETECTED NOT DETECTED Final   Plesimonas shigelloides NOT DETECTED NOT DETECTED Final   Salmonella species NOT DETECTED NOT DETECTED Final   Yersinia enterocolitica NOT DETECTED NOT DETECTED Final   Vibrio species NOT DETECTED NOT DETECTED Final   Vibrio cholerae NOT DETECTED NOT DETECTED Final   Enteroaggregative E coli (EAEC) NOT DETECTED NOT DETECTED Final   Enteropathogenic E coli (EPEC) NOT DETECTED NOT DETECTED Final   Enterotoxigenic E coli (ETEC) NOT DETECTED NOT DETECTED Final   Shiga like toxin producing E coli (STEC) NOT DETECTED NOT DETECTED Final   Shigella/Enteroinvasive E coli (EIEC) NOT DETECTED NOT DETECTED Final   Cryptosporidium NOT DETECTED NOT DETECTED Final   Cyclospora cayetanensis NOT DETECTED NOT DETECTED Final   Entamoeba histolytica NOT DETECTED NOT DETECTED Final   Giardia lamblia NOT DETECTED NOT DETECTED Final   Adenovirus F40/41 NOT DETECTED NOT DETECTED Final   Astrovirus NOT DETECTED NOT DETECTED Final   Norovirus GI/GII NOT DETECTED NOT DETECTED Final   Rotavirus A NOT DETECTED NOT DETECTED  Final   Sapovirus (I, II, IV, and V) NOT DETECTED NOT DETECTED Final    Comment: Performed at  [facility], [address] [place], [place] [address]         Radiology Studies: CT MAXILLOFACIAL W CONTRAST  Result Date: 07/15/2021 CLINICAL DATA:  Maxillary/facial abscess. Neutropenic fever. Large B-cell lymphoma. EXAM: CT MAXILLOFACIAL WITH CONTRAST TECHNIQUE: Multidetector CT imaging of the maxillofacial structures was performed with intravenous contrast. Multiplanar CT image reconstructions were also generated. RADIATION DOSE REDUCTION: This exam was performed according to the departmental dose-optimization program which includes automated exposure control, adjustment of the mA and/or kV according to patient size and/or use of iterative reconstruction technique. CONTRAST:  46m OMNIPAQUE IOHEXOL 300 MG/ML  SOLN COMPARISON:  03/28/2021 neck CT FINDINGS: Osseous: Negative for fracture or erosion. Orbits: No evidence of inflammation or mass. Bilateral cataract resection Sinuses: Chronic bilateral maxillary sinus atelectasis. Diffuse mucosal thickening in paranasal sinuses since prior. No fat infiltration or bony erosion to imply invasive sinus disease. Soft tissues: Fat inflammation in the right face and upper neck with platysma thickening. The right submandibular gland is larger than the left but this is stable and there is no definite glandular edema. Bilateral parotid nodules with interval cavitation of upper right nodule since prior, measuring 13 mm. The largest nodule in the left is in the deep load and measures up to 18 mm in length. Limited intracranial: Negative IMPRESSION: 1. Generalized sinusitis since neck CT 03/28/2021. No signs of invasive disease. 2. Cellulitis in the right lower face and upper neck without definite deep source. 3. Known bilateral parotid nodules. The upper right nodule appears cavitated since neck CT November 2022, an unexpected finding for previously suspected Warthin's tumor and more suggestive of lymphoma with treatment response Electronically Signed    By: JJorje GuildM.D.   On: 07/15/2021 10:09   DG Chest Port 1 View  Result Date: 07/13/2021 CLINICAL DATA:  Possible sepsis, weakness EXAM: PORTABLE CHEST 1 VIEW COMPARISON:  05/12/2021 FINDINGS: Right-sided central venous port tip over the SVC. No focal opacity, pleural effusion or pneumothorax. Borderline cardiomegaly. IMPRESSION: No active disease. Electronically Signed   By: KDonavan FoilM.D.   On: 07/13/2021 21:06        Scheduled Meds:  [START ON 07/16/2021] fludrocortisone  0.1 mg Oral Q breakfast   gabapentin  300 mg Oral BID   levothyroxine  88 mcg Oral Q0600   midodrine  5 mg Oral TID WC   pantoprazole  40 mg Oral Daily   potassium chloride  40 mEq Oral BID   predniSONE  10 mg Oral Q breakfast   rosuvastatin  5 mg Oral QHS   tamsulosin  0.4 mg Oral Daily   Continuous Infusions:  ceFEPime (MAXIPIME) IV 2 g (07/15/21 0422)   vancomycin 1,500 mg (07/14/21 2131)     LOS: 1 day    Time spent: 35 minutes    Blade Scheff DDarleen Crocker DO [facility]  If 7PM-7AM, please contact night-coverage [email or web address] 07/15/2021, 11:43 AM

## 2021-07-15 NOTE — Hospital Course (Addendum)
Per HPI: ?[name] is a 71 y.o. male with medical history significant of diffuse large B-cell lymphoma (follows with Dr. Lesly Dukes oncology) with last follow-up being on 3/1, hypothyroidism, hypotension, GERD, hyperlipidemia, BPH who presents to the emergency department via EMS due to weakness and diarrhea.  Patient states that he was on the floor since 11:00 AM, he was later found on the floor covered in diarrhea by son.  EMS was called and patient was taken to the ED for further evaluation and management. ?  ?07/14/21: Patient has been admitted with neutropenic fever in the setting of diffuse large B-cell lymphoma.  He has been seen by oncology and has been empirically initiated on IV vancomycin and cefepime with cultures pending.  No obvious source of infection noted at this time.  Continue aggressive IV fluid hydration.  Neulasta last administered 3/3 and therefore, patient should have improvement in WBC count soon.  Patient will have potassium and magnesium repleted. ? ?07/15/21: Patient continues to complain of right lower face and neck swelling/pain.  CT maxillofacial with findings of cellulitis to this region with no abscess.  Continue repletion of potassium and magnesium.  Diarrhea improved.  Continue current antibiotics.  WBC count still low. ? ?07/16/21: Diarrhea has resolved and patient states that facial and neck region are starting to improve as well.  He count improving.  Vancomycin discontinued and we will plan to keep on cefepime through today. ?

## 2021-07-16 LAB — CBC WITH DIFFERENTIAL/PLATELET
Abs Immature Granulocytes: 0.1 10*3/uL — ABNORMAL HIGH (ref 0.00–0.07)
Basophils Absolute: 0 10*3/uL (ref 0.0–0.1)
Basophils Relative: 3 %
Eosinophils Absolute: 0 10*3/uL (ref 0.0–0.5)
Eosinophils Relative: 2 %
HCT: 35.5 % — ABNORMAL LOW (ref 39.0–52.0)
Hemoglobin: 11.3 g/dL — ABNORMAL LOW (ref 13.0–17.0)
Immature Granulocytes: 7 %
Lymphocytes Relative: 43 %
Lymphs Abs: 0.6 10*3/uL — ABNORMAL LOW (ref 0.7–4.0)
MCH: 29.7 pg (ref 26.0–34.0)
MCHC: 31.8 g/dL (ref 30.0–36.0)
MCV: 93.4 fL (ref 80.0–100.0)
Monocytes Absolute: 0.2 10*3/uL (ref 0.1–1.0)
Monocytes Relative: 15 %
Neutro Abs: 0.4 10*3/uL — CL (ref 1.7–7.7)
Neutrophils Relative %: 30 %
Platelets: 61 10*3/uL — ABNORMAL LOW (ref 150–400)
RBC: 3.8 MIL/uL — ABNORMAL LOW (ref 4.22–5.81)
RDW: 14.9 % (ref 11.5–15.5)
WBC: 1.4 10*3/uL — CL (ref 4.0–10.5)
nRBC: 1.4 % — ABNORMAL HIGH (ref 0.0–0.2)

## 2021-07-16 LAB — MAGNESIUM: Magnesium: 1.9 mg/dL (ref 1.7–2.4)

## 2021-07-16 LAB — BASIC METABOLIC PANEL
Anion gap: 9 (ref 5–15)
BUN: 7 mg/dL — ABNORMAL LOW (ref 8–23)
CO2: 30 mmol/L (ref 22–32)
Calcium: 8.8 mg/dL — ABNORMAL LOW (ref 8.9–10.3)
Chloride: 97 mmol/L — ABNORMAL LOW (ref 98–111)
Creatinine, Ser: 0.69 mg/dL (ref 0.61–1.24)
GFR, Estimated: >60 mL/min (ref >60)
Glucose, Bld: 98 mg/dL (ref 70–99)
Potassium: 3.3 mmol/L — ABNORMAL LOW (ref 3.5–5.1)
Sodium: 136 mmol/L (ref 135–145)

## 2021-07-16 MED ORDER — POTASSIUM CHLORIDE CRYS ER 20 MEQ PO TBCR
40.0000 meq | EXTENDED_RELEASE_TABLET | Freq: Two times a day (BID) | ORAL | Status: AC
  Administered 2021-07-16 (×2): 40 meq via ORAL
  Filled 2021-07-16 (×2): qty 2

## 2021-07-16 NOTE — Progress Notes (Signed)
PROGRESS NOTE    Roy [name]  KPV:[id number] DOB: 28-Oct-1950 DOA: 07/13/2021 PCP: Roy Hartshorn, NP   Brief Narrative:  Per HPI: Roy [name] is a 71 y.o. male with medical history significant of diffuse large B-cell lymphoma (follows with Dr. Lesly [name] oncology) with last follow-up being on 3/1, hypothyroidism, hypotension, GERD, hyperlipidemia, BPH who presents to the emergency department via EMS due to weakness and diarrhea.  Patient states that he was on the floor since 11:00 AM, he was later found on the floor covered in diarrhea by son.  EMS was called and patient was taken to the ED for further evaluation and management.   07/14/21: Patient has been admitted with neutropenic fever in the setting of diffuse large B-cell lymphoma.  He has been seen by oncology and has been empirically initiated on IV vancomycin and cefepime with cultures pending.  No obvious source of infection noted at this time.  Continue aggressive IV fluid hydration.  Neulasta last administered 3/3 and therefore, patient should have improvement in WBC count soon.  Patient will have potassium and magnesium repleted.  07/15/21: Patient continues to complain of right lower face and neck swelling/pain.  CT maxillofacial with findings of cellulitis to this region with no abscess.  Continue repletion of potassium and magnesium.  Diarrhea improved.  Continue current antibiotics.  WBC count still low.  07/16/21: Diarrhea has resolved and patient states that facial and neck region are starting to improve as well.  He count improving.  Vancomycin discontinued and we will plan to keep on cefepime through today.    Assessment & Plan:   Principal Problem:   Sepsis due to undetermined organism [facility]) Active Problems:   Neutropenic fever (HCC)   Lactic acidosis   Hypokalemia   Hyponatremia   Large B-cell lymphoma (HCC)   Hypotension   Diarrhea   Hypothyroidism   Hyperlipidemia   BPH (benign prostatic  hyperplasia)   GERD (gastroesophageal reflux disease)  Assessment and Plan: * Sepsis due to undetermined organism [facility]) Patient met sepsis criteria due to being febrile, tachypneic and neutropenic (met SIRS criteria), source of infection possibly due to cellulitis of face/neck Lactic acid resolved Continue IV cefepime and vanc DC Continue IV hydration Continue Tylenol as needed for fever Blood cultures negative  Lactic acidosis resolved  Neutropenic fever (HCC) Continue Tylenol as needed Continue management as described above for sepsis Received Neulasta 3/3  Diarrhea Diarrhea was negative for C.Diff antigen and toxin GI stool panel negative Imodium prn Diarrhea is improving  Hypotension Continue midodrine per home regimen  Large B-cell lymphoma (HCC) Patient follows with Dr. Raliegh [name] (hematology oncology) with last office visit being on 3/1   Hypokalemia  K+ will be replenished Please monitor for AM K+ for further replenishmemnt   GERD (gastroesophageal reflux disease) Continue Protonix  BPH (benign prostatic hyperplasia) Continue Flomax  Hyperlipidemia Continue Crestor  Hypothyroidism Continue Synthroid    DVT prophylaxis: Holding Coumadin (for afib) due to thrombocytopenia Code Status: Full Family Communication: None at bedside Disposition Plan:  Status is: Inpatient Remains inpatient appropriate because: IV medications     Nutritional Assessment:   The patients BMI is: Body mass index is 36.2 kg/m.Marland Kitchen   Seen by dietician.  I agree with the assessment and plan as outlined below:   Nutrition Status:   Consultants:  Heme-Onc   Procedures:  See below   Antimicrobials:  Anti-infectives (From admission, onward)    Start     Dose/Rate Route Frequency Ordered Stop  07/14/21 2200  vancomycin (VANCOREADY) IVPB 1250 mg/250 mL  Status:  Discontinued        1,250 mg 166.7 mL/hr over 90 Minutes Intravenous Every 24 hours 07/13/21 2120 07/14/21 0837    07/14/21 2200  vancomycin (VANCOREADY) IVPB 1500 mg/300 mL  Status:  Discontinued        1,500 mg 150 mL/hr over 120 Minutes Intravenous Every 24 hours 07/14/21 0837 07/16/21 0655   07/14/21 0500  ceFEPIme (MAXIPIME) 2 g in sodium chloride 0.9 % 100 mL IVPB        2 g 200 mL/hr over 30 Minutes Intravenous Every 8 hours 07/13/21 2112     07/13/21 2115  vancomycin (VANCOREADY) IVPB 1500 mg/300 mL        1,500 mg 150 mL/hr over 120 Minutes Intravenous  Once 07/13/21 2112 07/14/21 0101   07/13/21 2045  ceFEPIme (MAXIPIME) 2 g in sodium chloride 0.9 % 100 mL IVPB        2 g 200 mL/hr over 30 Minutes Intravenous  Once 07/13/21 2044 07/13/21 2141   07/13/21 2045  metroNIDAZOLE (FLAGYL) IVPB 500 mg        500 mg 100 mL/hr over 60 Minutes Intravenous  Once 07/13/21 2044 07/13/21 2215   07/13/21 2045  vancomycin (VANCOCIN) IVPB 1000 mg/200 mL premix  Status:  Discontinued        1,000 mg 200 mL/hr over 60 Minutes Intravenous  Once 07/13/21 2044 07/13/21 2112       Subjective: Patient seen and evaluated today with no new acute complaints or concerns. No acute concerns or events noted overnight.  Objective: Vitals:   07/15/21 0300 07/15/21 1311 07/15/21 2200 07/16/21 0507  BP: (!) 109/59 129/76 127/72 126/88  Pulse: 72 70 64 71  Resp: 19 18 18 18   Temp: 98.8 F (37.1 C) 98.7 F (37.1 C) 98.9 F (37.2 C) 97.9 F (36.6 C)  TempSrc: Oral Oral Oral Oral  SpO2: 100% 95%  97%  Weight:      Height:        Intake/Output Summary (Last 24 hours) at 07/16/2021 1145 Last data filed at 07/16/2021 1054 Gross per 24 hour  Intake 660 ml  Output 3200 ml  Net -2540 ml   Filed Weights   07/13/21 1842 07/13/21 1852  Weight: 107.5 kg 108 kg    Examination:  General exam: Appears calm and comfortable  Respiratory system: Clear to auscultation. Respiratory effort normal. Cardiovascular system: S1 & S2 heard, RRR.  Gastrointestinal system: Abdomen is soft Central nervous system: Alert and  awake Extremities: No edema Skin: No significant lesions noted Psychiatry: Flat affect.    Data Reviewed: I have personally reviewed following labs and imaging studies  CBC: Recent Labs  Lab 07/13/21 1958 07/13/21 2239 07/14/21 0657 07/15/21 0456 07/16/21 0435  WBC 1.7*  --  0.8* 0.9* 1.4*  NEUTROABS  --  0.1*  --  0.2* 0.4*  HGB 12.1*  --  12.2* 10.0* 11.3*  HCT 37.3*  --  36.9* 31.4* 35.5*  MCV 95.9  --  95.1 94.3 93.4  PLT PLATELET CLUMPS NOTED ON SMEAR, COUNT APPEARS DECREASED  --  36* 40* 61*   Basic Metabolic Panel: Recent Labs  Lab 07/13/21 1958 07/14/21 0613 07/15/21 0456 07/16/21 0435  NA 132* 135 136 136  K 2.6* 3.0* 3.2* 3.3*  CL 90* 98 96* 97*  CO2 30 26 32 30  GLUCOSE 94 80 82 98  BUN 18 15 8  7*  CREATININE 1.27* 0.91  0.71 0.69  CALCIUM 8.7* 8.5* 8.8* 8.8*  MG  --  1.6* 1.5* 1.9  PHOS  --  4.3  --   --    GFR: Estimated Creatinine Clearance: 100.9 mL/min (by C-G formula based on SCr of 0.69 mg/dL). Liver Function Tests: Recent Labs  Lab 07/14/21 0613  AST 26  ALT 17  ALKPHOS 55  BILITOT 1.4*  PROT 5.4*  ALBUMIN 3.0*   No results for input(s): LIPASE, AMYLASE in the last 168 hours. No results for input(s): AMMONIA in the last 168 hours. Coagulation Profile: Recent Labs  Lab 07/13/21 1958  INR 1.4*   Cardiac Enzymes: Recent Labs  Lab 07/13/21 1958  CKTOTAL 94   BNP (last 3 results) No results for input(s): PROBNP in the last 8760 hours. HbA1C: No results for input(s): HGBA1C in the last 72 hours. CBG: Recent Labs  Lab 07/13/21 1952  GLUCAP 87   Lipid Profile: No results for input(s): CHOL, HDL, LDLCALC, TRIG, CHOLHDL, LDLDIRECT in the last 72 hours. Thyroid Function Tests: No results for input(s): TSH, T4TOTAL, FREET4, T3FREE, THYROIDAB in the last 72 hours. Anemia Panel: No results for input(s): VITAMINB12, FOLATE, FERRITIN, TIBC, IRON, RETICCTPCT in the last 72 hours. Sepsis Labs: Recent Labs  Lab 07/13/21 2239  07/14/21 0613 07/14/21 0657 07/15/21 0457  LATICACIDVEN 3.3* 2.4* 2.3* 1.5    Recent Results (from the past 240 hour(s))  Resp Panel by RT-PCR (Flu A&B, Covid) Nasopharyngeal Swab     Status: None   Collection Time: 07/13/21  7:00 PM   Specimen: Nasopharyngeal Swab; Nasopharyngeal(NP) swabs in vial transport medium  Result Value Ref Range Status   SARS Coronavirus 2 by RT PCR NEGATIVE NEGATIVE Final    Comment: (NOTE) SARS-CoV-2 target nucleic acids are NOT DETECTED.  The SARS-CoV-2 RNA is generally detectable in upper respiratory specimens during the acute phase of infection. The lowest concentration of SARS-CoV-2 viral copies this assay can detect is 138 copies/mL. A negative result does not preclude SARS-Cov-2 infection and should not be used as the sole basis for treatment or other patient management decisions. A negative result may occur with  improper specimen collection/handling, submission of specimen other than nasopharyngeal swab, presence of viral mutation(s) within the areas targeted by this assay, and inadequate number of viral copies(<138 copies/mL). A negative result must be combined with clinical observations, patient history, and epidemiological information. The expected result is Negative.  Fact Sheet for Patients:  EntrepreneurPulse.com.au  Fact Sheet for Healthcare Providers:  IncredibleEmployment.be  This test is no t yet approved or cleared by the [place] FDA and  has been authorized for detection and/or diagnosis of SARS-CoV-2 by FDA under an Emergency Use Authorization (EUA). This EUA will remain  in effect (meaning this test can be used) for the duration of the COVID-19 declaration under Section 564(b)(1) of the Act, 21 U.S.C.section 360bbb-3(b)(1), unless the authorization is terminated  or revoked sooner.       Influenza A by PCR NEGATIVE NEGATIVE Final   Influenza B by PCR NEGATIVE NEGATIVE Final     Comment: (NOTE) The Xpert Xpress SARS-CoV-2/FLU/RSV plus assay is intended as an aid in the diagnosis of influenza from Nasopharyngeal swab specimens and should not be used as a sole basis for treatment. Nasal washings and aspirates are unacceptable for Xpert Xpress SARS-CoV-2/FLU/RSV testing.  Fact Sheet for Patients: EntrepreneurPulse.com.au  Fact Sheet for Healthcare Providers: IncredibleEmployment.be  This test is not yet approved or cleared by the [place] FDA and has been  authorized for detection and/or diagnosis of SARS-CoV-2 by FDA under an Emergency Use Authorization (EUA). This EUA will remain in effect (meaning this test can be used) for the duration of the COVID-19 declaration under Section 564(b)(1) of the Act, 21 U.S.C. section 360bbb-3(b)(1), unless the authorization is terminated or revoked.  Performed at [facility], [address], [place], [place] [address]   Blood Culture (routine x 2)     Status: None (Preliminary result)   Collection Time: 07/13/21  7:58 PM   Specimen: Left Antecubital; Blood  Result Value Ref Range Status   Specimen Description LEFT ANTECUBITAL  Final   Special Requests   Final    BOTTLES DRAWN AEROBIC AND ANAEROBIC Blood Culture results may not be optimal due to an inadequate volume of blood received in culture bottles   Culture   Final    NO GROWTH 3 DAYS Performed at [facility], [address], [place], [place] [address]    Report Status PENDING  Incomplete  Blood Culture (routine x 2)     Status: None (Preliminary result)   Collection Time: 07/13/21  7:58 PM   Specimen: BLOOD RIGHT HAND  Result Value Ref Range Status   Specimen Description BLOOD RIGHT HAND  Final   Special Requests   Final    BOTTLES DRAWN AEROBIC ONLY Blood Culture results may not be optimal due to an inadequate volume of blood received in culture bottles   Culture   Final    NO GROWTH 3 DAYS Performed at [facility], [address] [place], [place] [address]    Report Status PENDING  Incomplete  Urine Culture     Status: Abnormal   Collection Time: 07/13/21  9:19 PM   Specimen: Urine, Clean Catch  Result Value Ref Range Status   Specimen Description   Final    URINE, CLEAN CATCH Performed at [facility], [address] [place], [place] [address]    Special Requests   Final    NONE Performed at [facility], [address], [place], [place] [address]    Culture MULTIPLE SPECIES PRESENT, SUGGEST RECOLLECTION (A)  Final   Report Status 07/15/2021 FINAL  Final  C Difficile Quick Screen w PCR reflex     Status: None   Collection Time: 07/14/21  1:04 AM   Specimen: STOOL  Result Value Ref Range Status   C Diff antigen NEGATIVE NEGATIVE Final   C Diff toxin NEGATIVE NEGATIVE Final   C Diff interpretation No C. difficile detected.  Final    Comment: Performed at [facility], [address], [place], [place] [address]  Gastrointestinal Panel by PCR , Stool     Status: None   Collection Time: 07/14/21  1:04 AM   Specimen: Stool  Result Value Ref Range Status   Campylobacter species NOT DETECTED NOT DETECTED Final   Plesimonas shigelloides NOT DETECTED NOT DETECTED Final   Salmonella species NOT DETECTED NOT DETECTED Final   Yersinia enterocolitica NOT DETECTED NOT DETECTED Final   Vibrio species NOT DETECTED NOT DETECTED Final   Vibrio cholerae NOT DETECTED NOT DETECTED Final   Enteroaggregative E coli (EAEC) NOT DETECTED NOT DETECTED Final   Enteropathogenic E coli (EPEC) NOT DETECTED NOT DETECTED Final   Enterotoxigenic E coli (ETEC) NOT DETECTED NOT DETECTED Final   Shiga like toxin producing E coli (STEC) NOT DETECTED NOT DETECTED Final   Shigella/Enteroinvasive E coli (EIEC) NOT DETECTED NOT DETECTED Final   Cryptosporidium NOT DETECTED NOT DETECTED Final  Cyclospora cayetanensis NOT DETECTED NOT DETECTED Final   Entamoeba histolytica NOT DETECTED NOT DETECTED Final   Giardia lamblia NOT  DETECTED NOT DETECTED Final   Adenovirus F40/41 NOT DETECTED NOT DETECTED Final   Astrovirus NOT DETECTED NOT DETECTED Final   Norovirus GI/GII NOT DETECTED NOT DETECTED Final   Rotavirus A NOT DETECTED NOT DETECTED Final   Sapovirus (I, II, IV, and V) NOT DETECTED NOT DETECTED Final    Comment: Performed at [facility], [address] [place], [place] [address]         Radiology Studies: CT MAXILLOFACIAL W CONTRAST  Result Date: 07/15/2021 CLINICAL DATA:  Maxillary/facial abscess. Neutropenic fever. Large B-cell lymphoma. EXAM: CT MAXILLOFACIAL WITH CONTRAST TECHNIQUE: Multidetector CT imaging of the maxillofacial structures was performed with intravenous contrast. Multiplanar CT image reconstructions were also generated. RADIATION DOSE REDUCTION: This exam was performed according to the departmental dose-optimization program which includes automated exposure control, adjustment of the mA and/or kV according to patient size and/or use of iterative reconstruction technique. CONTRAST:  59m OMNIPAQUE IOHEXOL 300 MG/ML  SOLN COMPARISON:  03/28/2021 neck CT FINDINGS: Osseous: Negative for fracture or erosion. Orbits: No evidence of inflammation or mass. Bilateral cataract resection Sinuses: Chronic bilateral maxillary sinus atelectasis. Diffuse mucosal thickening in paranasal sinuses since prior. No fat infiltration or bony erosion to imply invasive sinus disease. Soft tissues: Fat inflammation in the right face and upper neck with platysma thickening. The right submandibular gland is larger than the left but this is stable and there is no definite glandular edema. Bilateral parotid nodules with interval cavitation of upper right nodule since prior, measuring 13 mm. The largest nodule in the left is in the deep load and measures up to 18 mm in length. Limited intracranial: Negative IMPRESSION: 1. Generalized sinusitis since neck CT 03/28/2021. No signs of invasive disease. 2. Cellulitis in  the right lower face and upper neck without definite deep source. 3. Known bilateral parotid nodules. The upper right nodule appears cavitated since neck CT November 2022, an unexpected finding for previously suspected Warthin's tumor and more suggestive of lymphoma with treatment response Electronically Signed   By: JJorje GuildM.D.   On: 07/15/2021 10:09        Scheduled Meds:  fludrocortisone  0.1 mg Oral Q breakfast   gabapentin  300 mg Oral BID   levothyroxine  88 mcg Oral Q0600   midodrine  5 mg Oral TID WC   pantoprazole  40 mg Oral Daily   potassium chloride  40 mEq Oral BID   predniSONE  10 mg Oral Q breakfast   rosuvastatin  5 mg Oral QHS   tamsulosin  0.4 mg Oral Daily   Continuous Infusions:  ceFEPime (MAXIPIME) IV 2 g (07/16/21 0511)     LOS: 2 days    Time spent: 35 minutes    Lin Hackmann DDarleen Crocker DO [facility]  If 7PM-7AM, please contact night-coverage [email or web address] 07/16/2021, 11:45 AM

## 2021-07-16 NOTE — Progress Notes (Signed)
Patient awake and alert and oriented. Assisted patient to bed for the night. Patient requested Vaseline for dry lips, and pain medicine. Items were provided. VS taken. Patient was appreciative.  ?

## 2021-07-16 NOTE — Progress Notes (Signed)
Pharmacy Antibiotic Note ? ?Roy [name] is a 71 y.o. male admitted on 07/13/2021 with sepsis in the setting of diffuse large B cell lymphoma on chemotherapy. Pharmacy has been consulted for cefepime dosing. ? ? ?Plan: ?Continue cefepime 2g q8 hr ?Monitor cultures, clinical status, renal function ? ? ? ?Height: '5\' 8"'$  (172.7 cm) ?Weight: 108 kg (238 lb 1.6 oz) ?IBW/kg (Calculated) : 68.4 ? ?Temp (24hrs), Avg:98.5 ?F (36.9 ?C), Min:97.9 ?F (36.6 ?C), Max:98.9 ?F (37.2 ?C) ? ?Recent Labs  ?Lab 07/13/21 ?1958 07/13/21 ?2239 07/14/21 ?3536 07/14/21 ?1443 07/15/21 ?1540 07/15/21 ?0867 07/16/21 ?0435  ?WBC 1.7*  --   --  0.8* 0.9*  --  1.4*  ?CREATININE 1.27*  --  0.91  --  0.71  --  0.69  ?LATICACIDVEN 4.1* 3.3* 2.4* 2.3*  --  1.5  --   ? ?  ?Estimated Creatinine Clearance: 100.9 mL/min (by C-G formula based on SCr of 0.69 mg/dL).   ? ?Allergies  ?Allergen Reactions  ? Adhesive [Tape]   ?  Contact dermatitis  ? Sulfa Antibiotics   ?  UNKNOWN Childhood reaction  ? ? ?Antimicrobials this admission: ?vanc 3/9 >> 3/11 ?cefe 3/9 >>  ?MTZ 3/9  ? ? ?Microbiology results: ?3/9 BCx: ngtd ?3/9 Ucx: multiple species ?Cdiff: neg ? ?Thank you for allowing pharmacy to be a part of this patient?s care. ? ?Margot Ables, PharmD ?Clinical Pharmacist ?07/16/2021 9:37 AM ? ? ?

## 2021-07-17 LAB — BASIC METABOLIC PANEL
Anion gap: 10 (ref 5–15)
BUN: 7 mg/dL — ABNORMAL LOW (ref 8–23)
CO2: 29 mmol/L (ref 22–32)
Calcium: 8.4 mg/dL — ABNORMAL LOW (ref 8.9–10.3)
Chloride: 98 mmol/L (ref 98–111)
Creatinine, Ser: 0.66 mg/dL (ref 0.61–1.24)
GFR, Estimated: >60 mL/min (ref >60)
Glucose, Bld: 109 mg/dL — ABNORMAL HIGH (ref 70–99)
Potassium: 3.3 mmol/L — ABNORMAL LOW (ref 3.5–5.1)
Sodium: 137 mmol/L (ref 135–145)

## 2021-07-17 LAB — CBC
HCT: 34.1 % — ABNORMAL LOW (ref 39.0–52.0)
Hemoglobin: 10.7 g/dL — ABNORMAL LOW (ref 13.0–17.0)
MCH: 29.6 pg (ref 26.0–34.0)
MCHC: 31.4 g/dL (ref 30.0–36.0)
MCV: 94.5 fL (ref 80.0–100.0)
Platelets: 78 10*3/uL — ABNORMAL LOW (ref 150–400)
RBC: 3.61 MIL/uL — ABNORMAL LOW (ref 4.22–5.81)
RDW: 15.3 % (ref 11.5–15.5)
WBC: 1.9 10*3/uL — ABNORMAL LOW (ref 4.0–10.5)
nRBC: 2.1 % — ABNORMAL HIGH (ref 0.0–0.2)

## 2021-07-17 LAB — MAGNESIUM: Magnesium: 1.7 mg/dL (ref 1.7–2.4)

## 2021-07-17 MED ORDER — POTASSIUM CHLORIDE CRYS ER 20 MEQ PO TBCR
40.0000 meq | EXTENDED_RELEASE_TABLET | Freq: Two times a day (BID) | ORAL | Status: DC
  Administered 2021-07-17: 40 meq via ORAL
  Filled 2021-07-17: qty 2

## 2021-07-17 MED ORDER — LOPERAMIDE HCL 2 MG PO CAPS: 2.0000 mg | ORAL_CAPSULE | ORAL | 0 refills | Status: DC | PRN

## 2021-07-17 MED ORDER — CEFDINIR 300 MG PO CAPS: 300.0000 mg | ORAL_CAPSULE | Freq: Two times a day (BID) | ORAL | 0 refills | Status: AC

## 2021-07-17 MED ORDER — DOCUSATE SODIUM 100 MG PO CAPS
100.0000 mg | ORAL_CAPSULE | Freq: Every day | ORAL | Status: DC
  Administered 2021-07-17: 100 mg via ORAL
  Filled 2021-07-17: qty 1

## 2021-07-17 MED ORDER — OXYCODONE HCL 5 MG PO TABS: 5.0000 mg | ORAL_TABLET | Freq: Four times a day (QID) | ORAL | 0 refills | Status: DC | PRN

## 2021-07-17 MED ORDER — CYCLOBENZAPRINE HCL 5 MG PO TABS: 5.0000 mg | ORAL_TABLET | Freq: Three times a day (TID) | ORAL | 0 refills | Status: DC | PRN

## 2021-07-17 NOTE — TOC Transition Note (Signed)
Transition of Care (TOC) - CM/SW Discharge Note ? ? ?Patient Details  ?Name: Roy [name] ?MRN: [id number] ?Date of Birth: 01/29/1951 ? ?Transition of Care (TOC) CM/SW Contact:  ?Iona Beard, LCSWA ?Phone Number: ?07/17/2021, 12:50 PM ? ? ?Clinical Narrative:    ?CSW updated that pt was reccommended for [facility] PT and a walker at D/C. CSW spoke with pt in room to speak about recommendations. Pt states that he is not interested in [facility] at this time. Pt plans to go stay with his son and DIL for a while after hospital D/C. Pt requested PT exercises and states his DIL can work with him on doing these in the home. CSW spoke with PT who provided list of home exercises. CSW spoke to [place] with [employer] who accepts pts referral for a walker. CSW provided pt in room with walker and list of PT exercises. TOC signing off.   ? ?Final next level of care: Home/Self Care ?Barriers to Discharge: Barriers Resolved ? ? ?Patient Goals and CMS Choice ?Patient states their goals for this hospitalization and ongoing recovery are:: Return home ?CMS Medicare.gov Compare Post Acute Care list provided to:: Patient ?Choice offered to / list presented to : Patient ? ?Discharge Placement ?  ?           ?  ?  ?  ?  ? ?Discharge Plan and Services ?  ?  ?           ?DME Arranged: Walker ?DME Agency: [employer] ?Date DME Agency Contacted: 07/17/21 ?  ?Representative spoke with at DME Agency: Caryl Pina ?  ?  ?  ?  ?  ? ?Social Determinants of Health (SDOH) Interventions ?  ? ? ?Readmission Risk Interventions ?Readmission Risk Prevention Plan 07/17/2021  ?Transportation Screening Complete  ?[place] or Home Care Consult Complete  ?Social Work Consult for [place] Planning/Counseling Complete  ?Palliative Care Screening Not Applicable  ?Medication Review Press photographer) Complete  ?Some recent data might be hidden  ? ? ? ? ? ?

## 2021-07-17 NOTE — Care Management Important Message (Signed)
Important Message ? ?Patient Details  ?Name: Roy [name] ?MRN: [id number] ?Date of Birth: [birth year] ? ? ?Medicare Important Message Given:  Yes (spoke with Carlota Raspberry at [phone] to explain letter.  No additional copy needed.) ? ? ? ? ?[name] ?07/17/2021, 11:29 AM ?

## 2021-07-17 NOTE — Discharge Summary (Signed)
Physician Discharge Summary  Roy [name] TSV:[id number] DOB: May 24, 1950 DOA: 07/13/2021  PCP: Bridget Hartshorn, NP  Admit date: 07/13/2021  Discharge date: 07/17/2021  Admitted From:Home  Disposition:  Home  Recommendations for Outpatient Follow-up:  Follow up with PCP in 1-2 weeks Follow up with Dr. Delton Coombes as scheduled on 3/22 Continue to remain off Coumadin for now until further follow-up with Dr. Delton Coombes on account of thrombocytopenia Continue on Omnicef as prescribed for several more days for treatment of neck and facial cellulitis Oxycodone and muscle relaxants prescribed for back pain  Home Health: Yes with PT  Equipment/Devices: Rolling walker  Discharge Condition:Stable  CODE STATUS: Full  Diet recommendation: Heart Healthy  Brief/Interim Summary: Per HPI: Roy [name] is a 71 y.o. male with medical history significant of diffuse large B-cell lymphoma (follows with Dr. Lesly Dukes oncology) with last follow-up being on 3/1, hypothyroidism, hypotension, GERD, hyperlipidemia, BPH who presents to the emergency department via EMS due to weakness and diarrhea.  Patient states that he was on the floor since 11:00 AM, he was later found on the floor covered in diarrhea by son.  EMS was called and patient was taken to the ED for further evaluation and management.   07/14/21: Patient has been admitted with neutropenic fever in the setting of diffuse large B-cell lymphoma.  He has been seen by oncology and has been empirically initiated on IV vancomycin and cefepime with cultures pending.  No obvious source of infection noted at this time.  Continue aggressive IV fluid hydration.  Neulasta last administered 3/3 and therefore, patient should have improvement in WBC count soon.  Patient will have potassium and magnesium repleted.   07/15/21: Patient continues to complain of right lower face and neck swelling/pain.  CT maxillofacial with findings of cellulitis to this  region with no abscess.  Continue repletion of potassium and magnesium.  Diarrhea improved.  Continue current antibiotics.  WBC count still low.   07/16/21: Diarrhea has resolved and patient states that facial and neck region are starting to improve as well.  He count improving.  Vancomycin discontinued and we will plan to keep on cefepime through today.   07/17/2021: WBC count slowly improving with recent Neulasta injection.  Patient is overall feeling well and stable for discharge.  He will be transition to oral antibiotics for 1 more week to complete course of treatment and will have follow-up with Dr. Delton Coombes on 3/22.  Remain off Coumadin on account of thrombocytopenia until noted further improvement.  No other acute events noted throughout the course of this admission.  PT recommending home health services.  Discharge Diagnoses:  Principal Problem:   Sepsis due to undetermined organism [facility]) Active Problems:   Neutropenic fever (HCC)   Lactic acidosis   Hypokalemia   Hyponatremia   Large B-cell lymphoma (HCC)   Hypotension   Diarrhea   Hypothyroidism   Hyperlipidemia   BPH (benign prostatic hyperplasia)   GERD (gastroesophageal reflux disease)  Principal discharge diagnosis: Sepsis, ruled in, present on admission secondary to right facial/neck cellulitis.  Neutropenic fever.  Discharge Instructions  Discharge Instructions     Diet - low sodium heart healthy   Complete by: As directed    Increase activity slowly   Complete by: As directed       Allergies as of 07/17/2021       Reactions   Adhesive [tape]    Contact dermatitis   Sulfa Antibiotics    UNKNOWN Childhood reaction  Medication List     STOP taking these medications    warfarin 5 MG tablet Commonly known as: COUMADIN       TAKE these medications    acetaminophen 325 MG tablet Commonly known as: TYLENOL Take 2 tablets (650 mg total) by mouth every 6 (six) hours as needed for headache.    benzonatate 200 MG capsule Commonly known as: TESSALON Take 1 capsule (200 mg total) by mouth 3 (three) times daily as needed for cough.   Calcium-Vitamin D 600-125 MG-UNIT Tabs Take by mouth.   cefdinir 300 MG capsule Commonly known as: OMNICEF Take 1 capsule (300 mg total) by mouth 2 (two) times daily for 7 days.   cyclobenzaprine 5 MG tablet Commonly known as: FLEXERIL Take 1 tablet (5 mg total) by mouth 3 (three) times daily as needed for muscle spasms.   CYCLOPHOSPHAMIDE IV Inject into the vein every 21 ( twenty-one) days.   DOXORUBICIN HCL IV Inject into the vein every 21 ( twenty-one) days.   EQUATE STOOL SOFTENER 100 MG capsule Generic drug: docusate sodium Take 2 capsules by mouth twice daily   ferrous sulfate 325 (65 FE) MG EC tablet Take 1 tablet by mouth once daily with breakfast   fludrocortisone 0.1 MG tablet Commonly known as: FLORINEF Take 1 tablet (0.1 mg total) by mouth daily with breakfast.   gabapentin 300 MG capsule Commonly known as: Neurontin Take 1 capsule (300 mg total) by mouth 2 (two) times daily.   guaiFENesin 600 MG 12 hr tablet Commonly known as: Mucinex Take 1 tablet (600 mg total) by mouth 2 (two) times daily as needed.   Lactulose 20 GM/30ML Soln Take 30 ml by mouth every 3 hours until a bowel movement occurs.  Then take 30 ml by mouth at bedtime. What changed:  how much to take how to take this when to take this   levothyroxine 88 MCG tablet Commonly known as: SYNTHROID Take 1 tablet (88 mcg total) by mouth daily before breakfast.   lidocaine 2 % jelly Commonly known as: XYLOCAINE Apply 1 application. topically as needed (pain).   lidocaine-prilocaine cream Commonly known as: EMLA Apply a small amount to port a cath site and cover with plastic wrap 1 hour prior to chemotherapy appointments   loperamide 2 MG capsule Commonly known as: IMODIUM Take 1 capsule (2 mg total) by mouth as needed for diarrhea or loose stools.    magnesium oxide 400 (240 Mg) MG tablet Commonly known as: MAG-OX Take 1 tablet (400 mg total) by mouth 2 (two) times daily.   midodrine 5 MG tablet Commonly known as: PROAMATINE TAKE 2 TABLETS EACH MORNING, 1 TABLET EACH AFTERNOON AND 1 TABLET EACH EVENING. What changed:  how much to take when to take this additional instructions   omeprazole 10 MG capsule Commonly known as: PRILOSEC Take 1 capsule by mouth daily.   oxyCODONE 5 MG immediate release tablet Commonly known as: Oxy IR/ROXICODONE Take 1 tablet (5 mg total) by mouth every 6 (six) hours as needed for severe pain or breakthrough pain.   potassium chloride 10 MEQ tablet Commonly known as: KLOR-CON Take 10 mEq by mouth 2 (two) times daily.   prednisoLONE acetate 1 % ophthalmic suspension Commonly known as: PRED FORTE Place into the left eye.   predniSONE 10 MG tablet Commonly known as: DELTASONE Take 1 tablet (10 mg total) by mouth daily with breakfast.   predniSONE 20 MG tablet Commonly known as: DELTASONE Take 100 mg (5 tablets)  by mouth daily starting on each day of chemotherapy treatment, then 4 days after.  Repeat every 3 weeks. Do not take on the weeks you are not receiving treatment.   prochlorperazine 10 MG tablet Commonly known as: COMPAZINE Take 1 tablet (10 mg total) by mouth every 6 (six) hours as needed (Nausea or vomiting).   psyllium 0.52 g capsule Commonly known as: REGULOID Take by mouth.   riTUXimab in sodium chloride 0.9 % 250 mL Inject into the vein every 21 ( twenty-one) days.   rosuvastatin 5 MG tablet Commonly known as: CRESTOR Take 1 tablet by mouth at bedtime.   tamsulosin 0.4 MG Caps capsule Commonly known as: FLOMAX Take 1 capsule by mouth daily.   VINCRISTINE SULFATE IV Inject into the vein every 21 ( twenty-one) days.   vitamin B-12 1000 MCG tablet Commonly known as: CYANOCOBALAMIN Take 1 tablet (1,000 mcg total) by mouth daily.   Vitamin D3 25 MCG (1000 UT)  Caps Take by mouth daily.               Durable Medical Equipment  (From admission, onward)           Start     Ordered   07/17/21 1044  DME Walker  Once       Question Answer Comment  Walker: With 5 Inch Wheels   Patient needs a walker to treat with the following condition Weakness      07/17/21 1043            Follow-up Information     Hemberg, Karie Schwalbe, NP. Schedule an appointment as soon as possible for a visit in 1 week(s).   Specialty: Adult Health Nurse Practitioner Contact information: [place]  [phone]         Derek Jack, MD. Go on 07/26/2021.   Specialty: Hematology Contact information: [place] [address] [phone]                Allergies  Allergen Reactions   Adhesive [Tape]     Contact dermatitis   Sulfa Antibiotics     UNKNOWN Childhood reaction    Consultations: Heme/Onc  Procedures/Studies: CT MAXILLOFACIAL W CONTRAST  Result Date: 07/15/2021 CLINICAL DATA:  Maxillary/facial abscess. Neutropenic fever. Large B-cell lymphoma. EXAM: CT MAXILLOFACIAL WITH CONTRAST TECHNIQUE: Multidetector CT imaging of the maxillofacial structures was performed with intravenous contrast. Multiplanar CT image reconstructions were also generated. RADIATION DOSE REDUCTION: This exam was performed according to the departmental dose-optimization program which includes automated exposure control, adjustment of the mA and/or kV according to patient size and/or use of iterative reconstruction technique. CONTRAST:  59m OMNIPAQUE IOHEXOL 300 MG/ML  SOLN COMPARISON:  03/28/2021 neck CT FINDINGS: Osseous: Negative for fracture or erosion. Orbits: No evidence of inflammation or mass. Bilateral cataract resection Sinuses: Chronic bilateral maxillary sinus atelectasis. Diffuse mucosal thickening in paranasal sinuses since prior. No fat infiltration or bony erosion to imply invasive  sinus disease. Soft tissues: Fat inflammation in the right face and upper neck with platysma thickening. The right submandibular gland is larger than the left but this is stable and there is no definite glandular edema. Bilateral parotid nodules with interval cavitation of upper right nodule since prior, measuring 13 mm. The largest nodule in the left is in the deep load and measures up to 18 mm in length. Limited intracranial: Negative IMPRESSION: 1. Generalized sinusitis since neck CT 03/28/2021. No signs of invasive disease. 2. Cellulitis  in the right lower face and upper neck without definite deep source. 3. Known bilateral parotid nodules. The upper right nodule appears cavitated since neck CT November 2022, an unexpected finding for previously suspected Warthin's tumor and more suggestive of lymphoma with treatment response Electronically Signed   By: Jorje Guild M.D.   On: 07/15/2021 10:09   DG Chest Port 1 View  Result Date: 07/13/2021 CLINICAL DATA:  Possible sepsis, weakness EXAM: PORTABLE CHEST 1 VIEW COMPARISON:  05/12/2021 FINDINGS: Right-sided central venous port tip over the SVC. No focal opacity, pleural effusion or pneumothorax. Borderline cardiomegaly. IMPRESSION: No active disease. Electronically Signed   By: Donavan Foil M.D.   On: 07/13/2021 21:06     Discharge Exam: Vitals:   07/16/21 2000 07/17/21 0400  BP: (!) 141/91 (!) 155/89  Pulse: 68 69  Resp: 18 18  Temp: 98.5 F (36.9 C) 98.3 F (36.8 C)  SpO2: 94% 97%   Vitals:   07/16/21 0507 07/16/21 1335 07/16/21 2000 07/17/21 0400  BP: 126/88 122/76 (!) 141/91 (!) 155/89  Pulse: 71 69 68 69  Resp: '18 18 18 18  '$ Temp: 97.9 F (36.6 C) 98.6 F (37 C) 98.5 F (36.9 C) 98.3 F (36.8 C)  TempSrc: Oral Oral Oral Oral  SpO2: 97% 93% 94% 97%  Weight:      Height:        General: Pt is alert, awake, not in acute distress Cardiovascular: RRR, S1/S2 +, no rubs, no gallops Respiratory: CTA bilaterally, no wheezing, no  rhonchi Abdominal: Soft, NT, ND, bowel sounds + Extremities: no edema, no cyanosis    The results of significant diagnostics from this hospitalization (including imaging, microbiology, ancillary and laboratory) are listed below for reference.     Microbiology: Recent Results (from the past 240 hour(s))  Resp Panel by RT-PCR (Flu A&B, Covid) Nasopharyngeal Swab     Status: None   Collection Time: 07/13/21  7:00 PM   Specimen: Nasopharyngeal Swab; Nasopharyngeal(NP) swabs in vial transport medium  Result Value Ref Range Status   SARS Coronavirus 2 by RT PCR NEGATIVE NEGATIVE Final    Comment: (NOTE) SARS-CoV-2 target nucleic acids are NOT DETECTED.  The SARS-CoV-2 RNA is generally detectable in upper respiratory specimens during the acute phase of infection. The lowest concentration of SARS-CoV-2 viral copies this assay can detect is 138 copies/mL. A negative result does not preclude SARS-Cov-2 infection and should not be used as the sole basis for treatment or other patient management decisions. A negative result may occur with  improper specimen collection/handling, submission of specimen other than nasopharyngeal swab, presence of viral mutation(s) within the areas targeted by this assay, and inadequate number of viral copies(<138 copies/mL). A negative result must be combined with clinical observations, patient history, and epidemiological information. The expected result is Negative.  Fact Sheet for Patients:  EntrepreneurPulse.com.au  Fact Sheet for Healthcare Providers:  IncredibleEmployment.be  This test is no t yet approved or cleared by the [place] FDA and  has been authorized for detection and/or diagnosis of SARS-CoV-2 by FDA under an Emergency Use Authorization (EUA). This EUA will remain  in effect (meaning this test can be used) for the duration of the COVID-19 declaration under Section 564(b)(1) of the Act,  21 U.S.C.section 360bbb-3(b)(1), unless the authorization is terminated  or revoked sooner.       Influenza A by PCR NEGATIVE NEGATIVE Final   Influenza B by PCR NEGATIVE NEGATIVE Final    Comment: (NOTE) The Xpert Xpress  SARS-CoV-2/FLU/RSV plus assay is intended as an aid in the diagnosis of influenza from Nasopharyngeal swab specimens and should not be used as a sole basis for treatment. Nasal washings and aspirates are unacceptable for Xpert Xpress SARS-CoV-2/FLU/RSV testing.  Fact Sheet for Patients: EntrepreneurPulse.com.au  Fact Sheet for Healthcare Providers: IncredibleEmployment.be  This test is not yet approved or cleared by the [place] FDA and has been authorized for detection and/or diagnosis of SARS-CoV-2 by FDA under an Emergency Use Authorization (EUA). This EUA will remain in effect (meaning this test can be used) for the duration of the COVID-19 declaration under Section 564(b)(1) of the Act, 21 U.S.C. section 360bbb-3(b)(1), unless the authorization is terminated or revoked.  Performed at [facility], [address], [place], [place] [address]   Blood Culture (routine x 2)     Status: None (Preliminary result)   Collection Time: 07/13/21  7:58 PM   Specimen: Left Antecubital; Blood  Result Value Ref Range Status   Specimen Description LEFT ANTECUBITAL  Final   Special Requests   Final    BOTTLES DRAWN AEROBIC AND ANAEROBIC Blood Culture results may not be optimal due to an inadequate volume of blood received in culture bottles   Culture   Final    NO GROWTH 4 DAYS Performed at [facility], [address], [place], [place] [address]    Report Status PENDING  Incomplete  Blood Culture (routine x 2)     Status: None (Preliminary result)   Collection Time: 07/13/21  7:58 PM   Specimen: BLOOD RIGHT HAND  Result Value Ref Range Status   Specimen Description BLOOD RIGHT HAND  Final   Special Requests   Final     BOTTLES DRAWN AEROBIC ONLY Blood Culture results may not be optimal due to an inadequate volume of blood received in culture bottles   Culture   Final    NO GROWTH 4 DAYS Performed at [facility], [address], [place], [place] [address]    Report Status PENDING  Incomplete  Urine Culture     Status: Abnormal   Collection Time: 07/13/21  9:19 PM   Specimen: Urine, Clean Catch  Result Value Ref Range Status   Specimen Description   Final    URINE, CLEAN CATCH Performed at [facility], [address] [place], [place] [address]    Special Requests   Final    NONE Performed at [facility], [address], [place], [place] [address]    Culture MULTIPLE SPECIES PRESENT, SUGGEST RECOLLECTION (A)  Final   Report Status 07/15/2021 FINAL  Final  C Difficile Quick Screen w PCR reflex     Status: None   Collection Time: 07/14/21  1:04 AM   Specimen: STOOL  Result Value Ref Range Status   C Diff antigen NEGATIVE NEGATIVE Final   C Diff toxin NEGATIVE NEGATIVE Final   C Diff interpretation No C. difficile detected.  Final    Comment: Performed at [facility], [address], [place], [place] [address]  Gastrointestinal Panel by PCR , Stool     Status: None   Collection Time: 07/14/21  1:04 AM   Specimen: Stool  Result Value Ref Range Status   Campylobacter species NOT DETECTED NOT DETECTED Final   Plesimonas shigelloides NOT DETECTED NOT DETECTED Final   Salmonella species NOT DETECTED NOT DETECTED Final   Yersinia enterocolitica NOT DETECTED NOT DETECTED Final   Vibrio species NOT DETECTED NOT DETECTED Final   Vibrio cholerae NOT DETECTED  NOT DETECTED Final   Enteroaggregative E coli (EAEC) NOT DETECTED NOT DETECTED Final   Enteropathogenic E coli (EPEC) NOT DETECTED NOT DETECTED Final   Enterotoxigenic E coli (ETEC) NOT DETECTED NOT DETECTED Final   Shiga like toxin producing E coli (STEC) NOT DETECTED NOT DETECTED Final   Shigella/Enteroinvasive E coli (EIEC) NOT DETECTED NOT  DETECTED Final   Cryptosporidium NOT DETECTED NOT DETECTED Final   Cyclospora cayetanensis NOT DETECTED NOT DETECTED Final   Entamoeba histolytica NOT DETECTED NOT DETECTED Final   Giardia lamblia NOT DETECTED NOT DETECTED Final   Adenovirus F40/41 NOT DETECTED NOT DETECTED Final   Astrovirus NOT DETECTED NOT DETECTED Final   Norovirus GI/GII NOT DETECTED NOT DETECTED Final   Rotavirus A NOT DETECTED NOT DETECTED Final   Sapovirus (I, II, IV, and V) NOT DETECTED NOT DETECTED Final    Comment: Performed at [facility], [address] [place], [place] [address]     Labs: BNP (last 3 results) Recent Labs    01/08/21 0958  BNP 637.8*   Basic Metabolic Panel: Recent Labs  Lab 07/13/21 1958 07/14/21 0613 07/15/21 0456 07/16/21 0435 07/17/21 0423  NA 132* 135 136 136 137  K 2.6* 3.0* 3.2* 3.3* 3.3*  CL 90* 98 96* 97* 98  CO2 30 26 32 30 29  GLUCOSE 94 80 82 98 109*  BUN '18 15 8 '$ 7* 7*  CREATININE 1.27* 0.91 0.71 0.69 0.66  CALCIUM 8.7* 8.5* 8.8* 8.8* 8.4*  MG  --  1.6* 1.5* 1.9 1.7  PHOS  --  4.3  --   --   --    Liver Function Tests: Recent Labs  Lab 07/14/21 0613  AST 26  ALT 17  ALKPHOS 55  BILITOT 1.4*  PROT 5.4*  ALBUMIN 3.0*   No results for input(s): LIPASE, AMYLASE in the last 168 hours. No results for input(s): AMMONIA in the last 168 hours. CBC: Recent Labs  Lab 07/13/21 1958 07/13/21 2239 07/14/21 0657 07/15/21 0456 07/16/21 0435 07/17/21 0423  WBC 1.7*  --  0.8* 0.9* 1.4* 1.9*  NEUTROABS  --  0.1*  --  0.2* 0.4*  --   HGB 12.1*  --  12.2* 10.0* 11.3* 10.7*  HCT 37.3*  --  36.9* 31.4* 35.5* 34.1*  MCV 95.9  --  95.1 94.3 93.4 94.5  PLT PLATELET CLUMPS NOTED ON SMEAR, COUNT APPEARS DECREASED  --  36* 40* 61* 78*   Cardiac Enzymes: Recent Labs  Lab 07/13/21 1958  CKTOTAL 94   BNP: Invalid input(s): POCBNP CBG: Recent Labs  Lab 07/13/21 1952  GLUCAP 87   D-Dimer No results for input(s): DDIMER in the last 72 hours. Hgb  A1c No results for input(s): HGBA1C in the last 72 hours. Lipid Profile No results for input(s): CHOL, HDL, LDLCALC, TRIG, CHOLHDL, LDLDIRECT in the last 72 hours. Thyroid function studies No results for input(s): TSH, T4TOTAL, T3FREE, THYROIDAB in the last 72 hours.  Invalid input(s): FREET3 Anemia work up No results for input(s): VITAMINB12, FOLATE, FERRITIN, TIBC, IRON, RETICCTPCT in the last 72 hours. Urinalysis    Component Value Date/Time   COLORURINE AMBER (A) 07/13/2021 2119   APPEARANCEUR HAZY (A) 07/13/2021 2119   LABSPEC 1.014 07/13/2021 2119   PHURINE 5.0 07/13/2021 2119   GLUCOSEU NEGATIVE 07/13/2021 2119   HGBUR SMALL (A) 07/13/2021 2119   BILIRUBINUR NEGATIVE 07/13/2021 2119   [place] NEGATIVE 07/13/2021 2119   PROTEINUR 30 (A) 07/13/2021 2119   UROBILINOGEN 1.0 05/15/2010 2059  NITRITE NEGATIVE 07/13/2021 2119   LEUKOCYTESUR NEGATIVE 07/13/2021 2119   Sepsis Labs Invalid input(s): PROCALCITONIN,  WBC,  LACTICIDVEN Microbiology Recent Results (from the past 240 hour(s))  Resp Panel by RT-PCR (Flu A&B, Covid) Nasopharyngeal Swab     Status: None   Collection Time: 07/13/21  7:00 PM   Specimen: Nasopharyngeal Swab; Nasopharyngeal(NP) swabs in vial transport medium  Result Value Ref Range Status   SARS Coronavirus 2 by RT PCR NEGATIVE NEGATIVE Final    Comment: (NOTE) SARS-CoV-2 target nucleic acids are NOT DETECTED.  The SARS-CoV-2 RNA is generally detectable in upper respiratory specimens during the acute phase of infection. The lowest concentration of SARS-CoV-2 viral copies this assay can detect is 138 copies/mL. A negative result does not preclude SARS-Cov-2 infection and should not be used as the sole basis for treatment or other patient management decisions. A negative result may occur with  improper specimen collection/handling, submission of specimen other than nasopharyngeal swab, presence of viral mutation(s) within the areas targeted by this  assay, and inadequate number of viral copies(<138 copies/mL). A negative result must be combined with clinical observations, patient history, and epidemiological information. The expected result is Negative.  Fact Sheet for Patients:  EntrepreneurPulse.com.au  Fact Sheet for Healthcare Providers:  IncredibleEmployment.be  This test is no t yet approved or cleared by the [place] FDA and  has been authorized for detection and/or diagnosis of SARS-CoV-2 by FDA under an Emergency Use Authorization (EUA). This EUA will remain  in effect (meaning this test can be used) for the duration of the COVID-19 declaration under Section 564(b)(1) of the Act, 21 U.S.C.section 360bbb-3(b)(1), unless the authorization is terminated  or revoked sooner.       Influenza A by PCR NEGATIVE NEGATIVE Final   Influenza B by PCR NEGATIVE NEGATIVE Final    Comment: (NOTE) The Xpert Xpress SARS-CoV-2/FLU/RSV plus assay is intended as an aid in the diagnosis of influenza from Nasopharyngeal swab specimens and should not be used as a sole basis for treatment. Nasal washings and aspirates are unacceptable for Xpert Xpress SARS-CoV-2/FLU/RSV testing.  Fact Sheet for Patients: EntrepreneurPulse.com.au  Fact Sheet for Healthcare Providers: IncredibleEmployment.be  This test is not yet approved or cleared by the [place] FDA and has been authorized for detection and/or diagnosis of SARS-CoV-2 by FDA under an Emergency Use Authorization (EUA). This EUA will remain in effect (meaning this test can be used) for the duration of the COVID-19 declaration under Section 564(b)(1) of the Act, 21 U.S.C. section 360bbb-3(b)(1), unless the authorization is terminated or revoked.  Performed at [facility], [address], [place], [place] [address]   Blood Culture (routine x 2)     Status: None (Preliminary result)   Collection Time:  07/13/21  7:58 PM   Specimen: Left Antecubital; Blood  Result Value Ref Range Status   Specimen Description LEFT ANTECUBITAL  Final   Special Requests   Final    BOTTLES DRAWN AEROBIC AND ANAEROBIC Blood Culture results may not be optimal due to an inadequate volume of blood received in culture bottles   Culture   Final    NO GROWTH 4 DAYS Performed at [facility], [address] [place], [place] [address]    Report Status PENDING  Incomplete  Blood Culture (routine x 2)     Status: None (Preliminary result)   Collection Time: 07/13/21  7:58 PM   Specimen: BLOOD RIGHT HAND  Result Value Ref Range Status   Specimen Description BLOOD  RIGHT HAND  Final   Special Requests   Final    BOTTLES DRAWN AEROBIC ONLY Blood Culture results may not be optimal due to an inadequate volume of blood received in culture bottles   Culture   Final    NO GROWTH 4 DAYS Performed at [facility], [address], [place], [place] [address]    Report Status PENDING  Incomplete  Urine Culture     Status: Abnormal   Collection Time: 07/13/21  9:19 PM   Specimen: Urine, Clean Catch  Result Value Ref Range Status   Specimen Description   Final    URINE, CLEAN CATCH Performed at [facility] Hospital, [address] [place], [place] [address]    Special Requests   Final    NONE Performed at [facility], [address], [place], [place] [address]    Culture MULTIPLE SPECIES PRESENT, SUGGEST RECOLLECTION (A)  Final   Report Status 07/15/2021 FINAL  Final  C Difficile Quick Screen w PCR reflex     Status: None   Collection Time: 07/14/21  1:04 AM   Specimen: STOOL  Result Value Ref Range Status   C Diff antigen NEGATIVE NEGATIVE Final   C Diff toxin NEGATIVE NEGATIVE Final   C Diff interpretation No C. difficile detected.  Final    Comment: Performed at [facility], [address], [place], [place] [address]  Gastrointestinal Panel by PCR , Stool     Status: None   Collection Time: 07/14/21  1:04 AM   Specimen:  Stool  Result Value Ref Range Status   Campylobacter species NOT DETECTED NOT DETECTED Final   Plesimonas shigelloides NOT DETECTED NOT DETECTED Final   Salmonella species NOT DETECTED NOT DETECTED Final   Yersinia enterocolitica NOT DETECTED NOT DETECTED Final   Vibrio species NOT DETECTED NOT DETECTED Final   Vibrio cholerae NOT DETECTED NOT DETECTED Final   Enteroaggregative E coli (EAEC) NOT DETECTED NOT DETECTED Final   Enteropathogenic E coli (EPEC) NOT DETECTED NOT DETECTED Final   Enterotoxigenic E coli (ETEC) NOT DETECTED NOT DETECTED Final   Shiga like toxin producing E coli (STEC) NOT DETECTED NOT DETECTED Final   Shigella/Enteroinvasive E coli (EIEC) NOT DETECTED NOT DETECTED Final   Cryptosporidium NOT DETECTED NOT DETECTED Final   Cyclospora cayetanensis NOT DETECTED NOT DETECTED Final   Entamoeba histolytica NOT DETECTED NOT DETECTED Final   Giardia lamblia NOT DETECTED NOT DETECTED Final   Adenovirus F40/41 NOT DETECTED NOT DETECTED Final   Astrovirus NOT DETECTED NOT DETECTED Final   Norovirus GI/GII NOT DETECTED NOT DETECTED Final   Rotavirus A NOT DETECTED NOT DETECTED Final   Sapovirus (I, II, IV, and V) NOT DETECTED NOT DETECTED Final    Comment: Performed at [facility], [place] [place],  [address]     Time coordinating discharge: 35 minutes  SIGNED:   Rodena Goldmann, DO [facility] 07/17/2021, 10:44 AM  If 7PM-7AM, please contact night-coverage [email or web address]

## 2021-07-17 NOTE — Evaluation (Signed)
Physical Therapy Evaluation ?Patient Details ?Name: Roy [name] ?MRN: [id number] ?DOB: 07/03/1950 ?Today's Date: 07/17/2021 ? ?History of Present Illness ? Roy [name] is a 71 y.o. male with medical history significant of diffuse large B-cell lymphoma (follows with Dr. Lesly Dukes oncology) with last follow-up being on 3/1, hypothyroidism, hypotension, GERD, hyperlipidemia, BPH who presents to the emergency department via EMS due to weakness and diarrhea.  Patient states that he was on the floor since 11:00 AM, he was later found on the floor covered in diarrhea by son.  EMS was called and patient was taken to the ED for further evaluation and management. ?  ?Clinical Impression ? Patient functioning near baseline for functional mobility and gait other than having to lean on nearby objects for support during ambulation without AD, required use of RW for safety with good return demonstrated for use.  Patient tolerated sitting up in chair after therapy.  PLAN:  Patient to be discharged home today and discharged from acute physical therapy to care of nursing for ambulation as tolerated for length of stay with recommendations stated below  ? ?   ?   ? ?Recommendations for follow up therapy are one component of a multi-disciplinary discharge planning process, led by the attending physician.  Recommendations may be updated based on patient status, additional functional criteria and insurance authorization. ? ?Follow Up Recommendations Home health PT ? ?  ?Assistance Recommended at Discharge PRN  ?Patient can return home with the following ? Help with stairs or ramp for entrance;A little help with bathing/dressing/bathroom;A little help with walking and/or transfers ? ?  ?Equipment Recommendations Rolling walker (2 wheels)  ?Recommendations for Other Services ?    ?  ?Functional Status Assessment Patient has had a recent decline in their functional status and demonstrates the ability to make significant  improvements in function in a reasonable and predictable amount of time.  ? ?  ?Precautions / Restrictions Precautions ?Precautions: Fall ?Restrictions ?Weight Bearing Restrictions: No  ? ?  ? ?Mobility ? Bed Mobility ?Overal bed mobility: Needs Assistance ?Bed Mobility: Rolling, Sidelying to Sit ?Rolling: Supervision, Modified independent (Device/Increase time) ?Sidelying to sit: Supervision ?  ?  ?  ?General bed mobility comments: increased time with labored movement, fair/good return for log rolling to side and sitting up from side lying position ?  ? ?Transfers ?Overall transfer level: Modified independent ?Equipment used: None, Rolling walker (2 wheels) ?  ?  ?  ?  ?  ?  ?  ?General transfer comment: slightly unsteady with frequent leaning on nearby objects for support, required use of RW for safety ?  ? ?Ambulation/Gait ?Ambulation/Gait assistance: Supervision ?Gait Distance (Feet): 75 Feet ?Assistive device: Rolling walker (2 wheels), None ?Gait Pattern/deviations: Decreased step length - right, Decreased step length - left, Decreased stride length, Wide base of support ?Gait velocity: decreased ?  ?  ?General Gait Details: slightly unsteady labored gait with wide base of support without AD, safer using RW without loss of balance, limited mostly due to fatigue ? ?Stairs ?  ?  ?  ?  ?  ? ?Wheelchair Mobility ?  ? ?Modified Rankin (Stroke Patients Only) ?  ? ?  ? ?Balance Overall balance assessment: Needs assistance ?Sitting-balance support: Feet supported, No upper extremity supported ?Sitting balance-Leahy Scale: Good ?Sitting balance - Comments: seated at EOB ?  ?Standing balance support: During functional activity, No upper extremity supported ?Standing balance-Leahy Scale: Fair ?Standing balance comment: fair/good using RW ?  ?  ?  ?  ?  ?  ?  ?  ?  ?  ?  ?   ? ? ? ?  Pertinent Vitals/Pain Pain Assessment ?Pain Assessment: 0-10 ?Pain Score: 8  ?Pain Location: low back ?Pain Descriptors / Indicators:  Aching ?Pain Intervention(s): Limited activity within patient's tolerance, Monitored during session, Repositioned  ? ? ?Home Living Family/patient expects to be discharged to:: Private residence ?Living Arrangements: Alone ?Available Help at Discharge: Family ?Type of Home: House ?Home Access: Level entry ?  ?  ?  ?Home Layout: One level ?Home Equipment: Cane - single point;Grab bars - tub/shower ?   ?  ?Prior Function   ?  ?  ?  ?  ?  ?  ?Mobility Comments: household ambulator without AD, uses SPC for community distances ?ADLs Comments: Independent ?  ? ? ?Hand Dominance  ?   ? ?  ?Extremity/Trunk Assessment  ? Upper Extremity Assessment ?Upper Extremity Assessment: Overall WFL for tasks assessed ?  ? ?Lower Extremity Assessment ?Lower Extremity Assessment: Generalized weakness ?  ? ?Cervical / Trunk Assessment ?Cervical / Trunk Assessment: Normal  ?Communication  ? Communication: No difficulties  ?Cognition Arousal/Alertness: Awake/alert ?Behavior During Therapy: [facility] for tasks assessed/performed ?Overall Cognitive Status: Within Functional Limits for tasks assessed ?  ?  ?  ?  ?  ?  ?  ?  ?  ?  ?  ?  ?  ?  ?  ?  ?  ?  ?  ? ?  ?General Comments   ? ?  ?Exercises    ? ?Assessment/Plan  ?  ?PT Assessment All further PT needs can be met in the next venue of care  ?PT Problem List Decreased strength;Decreased activity tolerance;Decreased balance;Decreased mobility ? ?   ?  ?PT Treatment Interventions     ? ?PT Goals (Current goals can be found in the Care Plan section)  ?Acute Rehab PT Goals ?Patient Stated Goal: return home with family to assist ?PT Goal Formulation: With patient ?Time For Goal Achievement: 07/17/21 ?Potential to Achieve Goals: Good ? ?  ?Frequency   ?  ? ? ?Co-evaluation   ?  ?  ?  ?  ? ? ?  ?AM-PAC PT "6 Clicks" Mobility  ?Outcome Measure Help needed turning from your back to your side while in a flat bed without using bedrails?: None ?Help needed moving from lying on your back to sitting on the  side of a flat bed without using bedrails?: A Little ?Help needed moving to and from a bed to a chair (including a wheelchair)?: None ?Help needed standing up from a chair using your arms (e.g., wheelchair or bedside chair)?: None ?Help needed to walk in hospital room?: A Little ?Help needed climbing 3-5 steps with a railing? : A Little ?6 Click Score: 21 ? ?  ?End of Session   ?Activity Tolerance: Patient tolerated treatment well;Patient limited by fatigue ?Patient left: in chair;with call bell/phone within reach ?Nurse Communication: Mobility status ?PT Visit Diagnosis: Unsteadiness on feet (R26.81);Other abnormalities of gait and mobility (R26.89);Muscle weakness (generalized) (M62.81) ?  ? ?Time: 7591-6384 ?PT Time Calculation (min) (ACUTE ONLY): 25 min ? ? ?Charges:   PT Evaluation ?$PT Eval Moderate Complexity: 1 Mod ?PT Treatments ?$Therapeutic Activity: 23-37 mins ?  ?   ? ? ?11:36 AM, 07/17/21 ?Lonell Grandchild, MPT ?Physical Therapist with [place] ?[facility] ?[phone] office ?[phone] mobile phone ? ? ?

## 2021-07-20 LAB — CULTURE, BLOOD (ROUTINE X 2)
Culture: NO GROWTH
Culture: NO GROWTH

## 2021-07-26 ENCOUNTER — Inpatient Hospital Stay (HOSPITAL_COMMUNITY): Payer: Medicare Other

## 2021-07-26 ENCOUNTER — Other Ambulatory Visit: Payer: Self-pay

## 2021-07-26 ENCOUNTER — Inpatient Hospital Stay (HOSPITAL_BASED_OUTPATIENT_CLINIC_OR_DEPARTMENT_OTHER): Payer: Medicare Other | Admitting: Hematology

## 2021-07-26 VITALS — BP 141/95

## 2021-07-26 VITALS — BP 171/95 | HR 89 | Temp 97.7°F | Resp 20 | Ht 69.29 in | Wt 237.4 lb

## 2021-07-26 DIAGNOSIS — G629 Polyneuropathy, unspecified: Secondary | ICD-10-CM | POA: Diagnosis not present

## 2021-07-26 DIAGNOSIS — D696 Thrombocytopenia, unspecified: Secondary | ICD-10-CM

## 2021-07-26 DIAGNOSIS — C8338 Diffuse large B-cell lymphoma, lymph nodes of multiple sites: Secondary | ICD-10-CM

## 2021-07-26 DIAGNOSIS — R634 Abnormal weight loss: Secondary | ICD-10-CM | POA: Diagnosis not present

## 2021-07-26 DIAGNOSIS — Z5189 Encounter for other specified aftercare: Secondary | ICD-10-CM | POA: Diagnosis not present

## 2021-07-26 DIAGNOSIS — Z5111 Encounter for antineoplastic chemotherapy: Secondary | ICD-10-CM | POA: Diagnosis present

## 2021-07-26 DIAGNOSIS — D72819 Decreased white blood cell count, unspecified: Secondary | ICD-10-CM

## 2021-07-26 DIAGNOSIS — E790 Hyperuricemia without signs of inflammatory arthritis and tophaceous disease: Secondary | ICD-10-CM | POA: Diagnosis not present

## 2021-07-26 DIAGNOSIS — Z5112 Encounter for antineoplastic immunotherapy: Secondary | ICD-10-CM | POA: Diagnosis present

## 2021-07-26 DIAGNOSIS — R350 Frequency of micturition: Secondary | ICD-10-CM | POA: Diagnosis not present

## 2021-07-26 DIAGNOSIS — R5383 Other fatigue: Secondary | ICD-10-CM | POA: Diagnosis not present

## 2021-07-26 DIAGNOSIS — Z7952 Long term (current) use of systemic steroids: Secondary | ICD-10-CM | POA: Diagnosis not present

## 2021-07-26 DIAGNOSIS — I10 Essential (primary) hypertension: Secondary | ICD-10-CM | POA: Diagnosis not present

## 2021-07-26 DIAGNOSIS — Z79899 Other long term (current) drug therapy: Secondary | ICD-10-CM | POA: Diagnosis not present

## 2021-07-26 DIAGNOSIS — C8330 Diffuse large B-cell lymphoma, unspecified site: Secondary | ICD-10-CM | POA: Diagnosis present

## 2021-07-26 DIAGNOSIS — I4891 Unspecified atrial fibrillation: Secondary | ICD-10-CM | POA: Diagnosis not present

## 2021-07-26 DIAGNOSIS — Z7901 Long term (current) use of anticoagulants: Secondary | ICD-10-CM | POA: Diagnosis not present

## 2021-07-26 LAB — CBC WITH DIFFERENTIAL/PLATELET
Abs Immature Granulocytes: 0.08 10*3/uL — ABNORMAL HIGH (ref 0.00–0.07)
Basophils Absolute: 0.1 10*3/uL (ref 0.0–0.1)
Basophils Relative: 1 %
Eosinophils Absolute: 0 10*3/uL (ref 0.0–0.5)
Eosinophils Relative: 0 %
HCT: 38.5 % — ABNORMAL LOW (ref 39.0–52.0)
Hemoglobin: 12.1 g/dL — ABNORMAL LOW (ref 13.0–17.0)
Immature Granulocytes: 1 %
Lymphocytes Relative: 11 %
Lymphs Abs: 0.8 10*3/uL (ref 0.7–4.0)
MCH: 29.3 pg (ref 26.0–34.0)
MCHC: 31.4 g/dL (ref 30.0–36.0)
MCV: 93.2 fL (ref 80.0–100.0)
Monocytes Absolute: 0.2 10*3/uL (ref 0.1–1.0)
Monocytes Relative: 3 %
Neutro Abs: 5.8 10*3/uL (ref 1.7–7.7)
Neutrophils Relative %: 84 %
Platelets: 347 10*3/uL (ref 150–400)
RBC: 4.13 MIL/uL — ABNORMAL LOW (ref 4.22–5.81)
RDW: 15.6 % — ABNORMAL HIGH (ref 11.5–15.5)
WBC: 7 10*3/uL (ref 4.0–10.5)
nRBC: 0 % (ref 0.0–0.2)

## 2021-07-26 LAB — COMPREHENSIVE METABOLIC PANEL
ALT: 14 U/L (ref 0–44)
AST: 24 U/L (ref 15–41)
Albumin: 3.8 g/dL (ref 3.5–5.0)
Alkaline Phosphatase: 69 U/L (ref 38–126)
Anion gap: 11 (ref 5–15)
BUN: 12 mg/dL (ref 8–23)
CO2: 28 mmol/L (ref 22–32)
Calcium: 9.2 mg/dL (ref 8.9–10.3)
Chloride: 98 mmol/L (ref 98–111)
Creatinine, Ser: 0.77 mg/dL (ref 0.61–1.24)
GFR, Estimated: >60 mL/min (ref >60)
Glucose, Bld: 113 mg/dL — ABNORMAL HIGH (ref 70–99)
Potassium: 3.8 mmol/L (ref 3.5–5.1)
Sodium: 137 mmol/L (ref 135–145)
Total Bilirubin: 0.5 mg/dL (ref 0.3–1.2)
Total Protein: 6.6 g/dL (ref 6.5–8.1)

## 2021-07-26 LAB — LACTATE DEHYDROGENASE: LDH: 177 U/L (ref 98–192)

## 2021-07-26 LAB — URIC ACID: Uric Acid, Serum: 5 mg/dL (ref 3.7–8.6)

## 2021-07-26 LAB — MAGNESIUM: Magnesium: 2.1 mg/dL (ref 1.7–2.4)

## 2021-07-26 MED ORDER — HEPARIN SOD (PORK) LOCK FLUSH 100 UNIT/ML IV SOLN
500.0000 [IU] | Freq: Once | INTRAVENOUS | Status: AC
  Administered 2021-07-26: 500 [IU] via INTRAVENOUS

## 2021-07-26 MED ORDER — SODIUM CHLORIDE 0.9% FLUSH
10.0000 mL | INTRAVENOUS | Status: DC | PRN
  Administered 2021-07-26: 10 mL via INTRAVENOUS

## 2021-07-26 NOTE — Progress Notes (Signed)
No chemotherapy treatment today per Dr. Delton Coombes due to recent hospitalization.  Patient will return in two weeks for labs and final treatment.  ?

## 2021-07-26 NOTE — Progress Notes (Signed)
? ?[facility] ?[address] ?[place], [place] [address] ? ? ?CLINIC:  ?Medical Oncology/Hematology ? ?PCP:  ?Bridget Hartshorn, NP ?[place] / [place] [id number] ?[phone] ? ? ?REASON FOR VISIT:  ?Follow-up for diffuse large B-cell lymphoma ? ?PRIOR THERAPY: none ? ?NGS Results: not done ? ?CURRENT THERAPY:  R-CHOP every 3 weeks ? ?BRIEF ONCOLOGIC HISTORY:  ?Oncology History  ?Large B-cell lymphoma ([place])  ?03/22/2021 Initial Diagnosis  ? DLBCL (diffuse large B cell lymphoma) ([place]) ?  ?03/22/2021 Cancer Staging  ? Staging form: Hodgkin and Non-Hodgkin Lymphoma, AJCC 8th Edition ?- Clinical stage from 03/22/2021: Stage IV (Diffuse large B-cell lymphoma) - Signed by Derek Jack, MD on 03/22/2021 ? ?  ?04/12/2021 -  Chemotherapy  ? Patient is on Treatment Plan : NON-HODGKINS LYMPHOMA R-CHOP q21d  ?   ? ? ?CANCER STAGING: ? Cancer Staging  ?Large B-cell lymphoma ([place]) ?Staging form: Hodgkin and Non-Hodgkin Lymphoma, AJCC 8th Edition ?- Clinical stage from 03/22/2021: Stage IV (Diffuse large B-cell lymphoma) - Signed by Derek Jack, MD on 03/22/2021 ? ? ?INTERVAL HISTORY:  ?[name], a 71 y.o. male, returns for routine follow-up and consideration for next cycle of chemotherapy. Roy [name] was last seen on 07/05/2021. ? ?Due for cycle #6 of R-CHOP today.  ? ?Overall, he tells me he has been feeling pretty well. He reports improved back pain. He also reports urinary frequency. He feels off balance when walking. He reports continues numbness in his feet and  3 fingers on his right hand which he reports has been unchanged by the Gabapentin, but he denies difficulty sleeping. He is eating well. He denies nausea and vomiting, and he reports diarrhea. The redness on his jaw has resolved. He denies rhinorrhea, and he reports dry mouth.  ? ?Overall, he is not ready for next cycle of chemo today.  ? ?REVIEW OF SYSTEMS:  ?Review of Systems  ?Constitutional:  Positive  for fatigue. Negative for appetite change.  ?HENT:   Positive for trouble swallowing.   ?     Dry mouth  ?Gastrointestinal:  Positive for diarrhea. Negative for nausea and vomiting.  ?Genitourinary:  Positive for frequency.   ?Musculoskeletal:  Positive for back pain (improved) and gait problem (off balance).  ?Neurological:  Positive for dizziness, gait problem (off balance) and numbness.  ?Psychiatric/Behavioral:  Negative for sleep disturbance.   ?All other systems reviewed and are negative. ? ?PAST MEDICAL/SURGICAL HISTORY:  ?Past Medical History:  ?Diagnosis Date  ? Atrial fibrillation ([place])   ? GERD (gastroesophageal reflux disease)   ? High cholesterol   ? Hypertension   ? Obesity   ? ?Past Surgical History:  ?Procedure Laterality Date  ? APPENDECTOMY    ? CATARACT EXTRACTION Bilateral 2022  ? CHOLECYSTECTOMY    ? ESOPHAGEAL DILATION    ? multiple times  ? IR IMAGING GUIDED PORT INSERTION  03/31/2021  ? JOINT REPLACEMENT Left   ? hip  ? ? ?SOCIAL HISTORY:  ?Social History  ? ?Socioeconomic History  ? Marital status: Widowed  ?  Spouse name: Not on file  ? Number of children: 3  ? Years of education: Not on file  ? Highest education level: Not on file  ?Occupational History  ? Occupation: Retired  ?Tobacco Use  ? Smoking status: Never  ? Smokeless tobacco: Never  ?Vaping Use  ? Vaping Use: Never used  ?Substance and Sexual Activity  ? Alcohol use: No  ? Drug use:  No  ? Sexual activity: Not Currently  ?Other Topics Concern  ? Not on file  ?Social History Narrative  ? Not on file  ? ?Social Determinants of Health  ? ?Financial Resource Strain: Low Risk   ? Difficulty of Paying Living Expenses: Not hard at all  ?Food Insecurity: No Food Insecurity  ? Worried About Charity fundraiser in the Last Year: Never true  ? Ran Out of Food in the Last Year: Never true  ?Transportation Needs: No Transportation Needs  ? Lack of Transportation (Medical): No  ? Lack of Transportation (Non-Medical): No  ?Physical Activity:  Sufficiently Active  ? Days of Exercise per Week: 5 days  ? Minutes of Exercise per Session: 30 min  ?Stress: No Stress Concern Present  ? Feeling of Stress : Not at all  ?Social Connections: Moderately Isolated  ? Frequency of Communication with Friends and Family: More than three times a week  ? Frequency of Social Gatherings with Friends and Family: Three times a week  ? Attends Religious Services: 1 to 4 times per year  ? Active Member of Clubs or Organizations: No  ? Attends Archivist Meetings: Never  ? Marital Status: Widowed  ?Intimate Partner Violence: Not At Risk  ? Fear of Current or Ex-Partner: No  ? Emotionally Abused: No  ? Physically Abused: No  ? Sexually Abused: No  ? ? ?FAMILY HISTORY:  ?Family History  ?Problem Relation Age of Onset  ? Heart failure Father   ? Heart attack Father   ?     Deceased   ? Thyroid disease Sister   ? ? ?CURRENT MEDICATIONS:  ?Current Outpatient Medications  ?Medication Sig Dispense Refill  ? acetaminophen (TYLENOL) 325 MG tablet Take 2 tablets (650 mg total) by mouth every 6 (six) hours as needed for headache. 30 tablet 1  ? benzonatate (TESSALON) 200 MG capsule Take 1 capsule (200 mg total) by mouth 3 (three) times daily as needed for cough. 20 capsule 0  ? Calcium Carbonate-Vitamin D (CALCIUM-VITAMIN D) 600-125 MG-UNIT TABS Take by mouth.    ? Cholecalciferol (VITAMIN D3) 25 MCG (1000 UT) CAPS Take by mouth daily.    ? cyclobenzaprine (FLEXERIL) 5 MG tablet Take 1 tablet (5 mg total) by mouth 3 (three) times daily as needed for muscle spasms. 30 tablet 0  ? CYCLOPHOSPHAMIDE IV Inject into the vein every 21 ( twenty-one) days.    ? DOXORUBICIN HCL IV Inject into the vein every 21 ( twenty-one) days.    ? EQUATE STOOL SOFTENER 100 MG capsule Take 2 capsules by mouth twice daily 120 capsule 0  ? erythromycin ophthalmic ointment SMARTSIG:In Eye(s)    ? ferrous sulfate 325 (65 FE) MG EC tablet Take 1 tablet by mouth once daily with breakfast 30 tablet 0  ?  fludrocortisone (FLORINEF) 0.1 MG tablet Take 1 tablet (0.1 mg total) by mouth daily with breakfast. 90 tablet 1  ? gabapentin (NEURONTIN) 300 MG capsule Take 1 capsule (300 mg total) by mouth 2 (two) times daily. 60 capsule 6  ? guaiFENesin (MUCINEX) 600 MG 12 hr tablet Take 1 tablet (600 mg total) by mouth 2 (two) times daily as needed. 30 tablet 0  ? Lactulose 20 GM/30ML SOLN Take 30 ml by mouth every 3 hours until a bowel movement occurs.  Then take 30 ml by mouth at bedtime. (Patient taking differently: Take 30 mLs by mouth See admin instructions. Take 30 ml by mouth every 3 hours until  a bowel movement occurs.  Then take 30 ml by mouth at bedtime.) 450 mL 2  ? levothyroxine (SYNTHROID) 88 MCG tablet Take 1 tablet (88 mcg total) by mouth daily before breakfast. 90 tablet 1  ? lidocaine (XYLOCAINE) 2 % jelly Apply 1 application. topically as needed (pain).    ? lidocaine-prilocaine (EMLA) cream Apply a small amount to port a cath site and cover with plastic wrap 1 hour prior to chemotherapy appointments 30 g 3  ? loperamide (IMODIUM) 2 MG capsule Take 1 capsule (2 mg total) by mouth as needed for diarrhea or loose stools. 30 capsule 0  ? magnesium oxide (MAG-OX) 400 (240 Mg) MG tablet Take 1 tablet (400 mg total) by mouth 2 (two) times daily. 60 tablet 1  ? midodrine (PROAMATINE) 5 MG tablet TAKE 2 TABLETS EACH MORNING, 1 TABLET EACH AFTERNOON AND 1 TABLET EACH EVENING. (Patient taking differently: 5 mg 3 (three) times daily with meals.) 360 tablet 0  ? omeprazole (PRILOSEC) 10 MG capsule Take 1 capsule by mouth daily.    ? oxyCODONE (OXY IR/ROXICODONE) 5 MG immediate release tablet Take 1 tablet (5 mg total) by mouth every 6 (six) hours as needed for severe pain or breakthrough pain. 10 tablet 0  ? potassium chloride (KLOR-CON) 10 MEQ tablet Take 10 mEq by mouth 2 (two) times daily.    ? prednisoLONE acetate (PRED FORTE) 1 % ophthalmic suspension Place into the left eye.    ? predniSONE (DELTASONE) 10 MG tablet  Take 1 tablet (10 mg total) by mouth daily with breakfast. 95 tablet 1  ? predniSONE (DELTASONE) 20 MG tablet Take 100 mg (5 tablets) by mouth daily starting on each day of chemotherapy treatment, then 4 days a

## 2021-07-26 NOTE — Patient Instructions (Signed)
[place] at [facility] ?Discharge Instructions ? ? ?You were seen and examined today by Dr. Delton Coombes. ? ?He reviewed the results of your lab work which is normal/stable. ? ?We will give you a two week break before we do your final treatment in order for you to be at full strength for tx.  ? ?Return as scheduled in 2 weeks.  ? ? ?Thank you for choosing [place] at [facility] to provide your oncology and hematology care.  To afford each patient quality time with our provider, please arrive at least 15 minutes before your scheduled appointment time.  ? ?If you have a lab appointment with the [facility] please come in thru the Main Entrance and check in at the main information desk. ? ?You need to re-schedule your appointment should you arrive 10 or more minutes late.  We strive to give you quality time with our providers, and arriving late affects you and other patients whose appointments are after yours.  Also, if you no show three or more times for appointments you may be dismissed from the clinic at the providers discretion.     ?Again, thank you for choosing [facility].  Our hope is that these requests will decrease the amount of time that you wait before being seen by our physicians.       ?_____________________________________________________________ ? ?Should you have questions after your visit to [facility], please contact our office at [phone] and follow the prompts.  Our office hours are 8:00 a.m. and 4:30 p.m. Monday - Friday.  Please note that voicemails left after 4:00 p.m. may not be returned until the following business day.  We are closed weekends and major holidays.  You do have access to a nurse 24-7, just call the main number to the clinic [phone] and do not press any options, hold on the line and a nurse will answer the phone.   ? ?For prescription refill requests, have your pharmacy contact our office  and allow 72 hours.   ? ?Due to Covid, you will need to wear a mask upon entering the hospital. If you do not have a mask, a mask will be given to you at the Main Entrance upon arrival. For doctor visits, patients may have 1 support person age 39 or older with them. For treatment visits, patients can not have anyone with them due to social distancing guidelines and our immunocompromised population.  ? ?   ?

## 2021-07-28 ENCOUNTER — Ambulatory Visit (HOSPITAL_COMMUNITY): Payer: Medicare Other

## 2021-07-30 ENCOUNTER — Other Ambulatory Visit (HOSPITAL_COMMUNITY): Payer: Self-pay | Admitting: Physician Assistant

## 2021-07-30 DIAGNOSIS — E611 Iron deficiency: Secondary | ICD-10-CM

## 2021-08-13 ENCOUNTER — Other Ambulatory Visit (HOSPITAL_COMMUNITY): Payer: Self-pay | Admitting: Physician Assistant

## 2021-08-13 DIAGNOSIS — K5900 Constipation, unspecified: Secondary | ICD-10-CM

## 2021-08-14 ENCOUNTER — Inpatient Hospital Stay (HOSPITAL_COMMUNITY): Payer: Medicare Other | Attending: Hematology

## 2021-08-14 ENCOUNTER — Inpatient Hospital Stay (HOSPITAL_COMMUNITY): Payer: Medicare Other

## 2021-08-14 ENCOUNTER — Inpatient Hospital Stay (HOSPITAL_BASED_OUTPATIENT_CLINIC_OR_DEPARTMENT_OTHER): Payer: Medicare Other | Admitting: Hematology

## 2021-08-14 VITALS — BP 135/78 | HR 60 | Temp 98.5°F | Resp 18

## 2021-08-14 VITALS — BP 186/72

## 2021-08-14 DIAGNOSIS — Z5189 Encounter for other specified aftercare: Secondary | ICD-10-CM | POA: Insufficient documentation

## 2021-08-14 DIAGNOSIS — G629 Polyneuropathy, unspecified: Secondary | ICD-10-CM | POA: Insufficient documentation

## 2021-08-14 DIAGNOSIS — Z79899 Other long term (current) drug therapy: Secondary | ICD-10-CM | POA: Insufficient documentation

## 2021-08-14 DIAGNOSIS — Z5112 Encounter for antineoplastic immunotherapy: Secondary | ICD-10-CM | POA: Diagnosis present

## 2021-08-14 DIAGNOSIS — E876 Hypokalemia: Secondary | ICD-10-CM | POA: Insufficient documentation

## 2021-08-14 DIAGNOSIS — Z7901 Long term (current) use of anticoagulants: Secondary | ICD-10-CM | POA: Diagnosis not present

## 2021-08-14 DIAGNOSIS — C8330 Diffuse large B-cell lymphoma, unspecified site: Secondary | ICD-10-CM | POA: Insufficient documentation

## 2021-08-14 DIAGNOSIS — R634 Abnormal weight loss: Secondary | ICD-10-CM | POA: Insufficient documentation

## 2021-08-14 DIAGNOSIS — I10 Essential (primary) hypertension: Secondary | ICD-10-CM | POA: Insufficient documentation

## 2021-08-14 DIAGNOSIS — C8338 Diffuse large B-cell lymphoma, lymph nodes of multiple sites: Secondary | ICD-10-CM | POA: Diagnosis not present

## 2021-08-14 DIAGNOSIS — I4891 Unspecified atrial fibrillation: Secondary | ICD-10-CM | POA: Diagnosis not present

## 2021-08-14 DIAGNOSIS — Z5111 Encounter for antineoplastic chemotherapy: Secondary | ICD-10-CM | POA: Diagnosis present

## 2021-08-14 DIAGNOSIS — Z7952 Long term (current) use of systemic steroids: Secondary | ICD-10-CM | POA: Diagnosis not present

## 2021-08-14 DIAGNOSIS — C8510 Unspecified B-cell lymphoma, unspecified site: Secondary | ICD-10-CM

## 2021-08-14 LAB — CBC WITH DIFFERENTIAL/PLATELET
Abs Immature Granulocytes: 0.05 10*3/uL (ref 0.00–0.07)
Basophils Absolute: 0.1 10*3/uL (ref 0.0–0.1)
Basophils Relative: 1 %
Eosinophils Absolute: 0.4 10*3/uL (ref 0.0–0.5)
Eosinophils Relative: 4 %
HCT: 42.3 % (ref 39.0–52.0)
Hemoglobin: 13.4 g/dL (ref 13.0–17.0)
Immature Granulocytes: 1 %
Lymphocytes Relative: 16 %
Lymphs Abs: 1.5 10*3/uL (ref 0.7–4.0)
MCH: 29.3 pg (ref 26.0–34.0)
MCHC: 31.7 g/dL (ref 30.0–36.0)
MCV: 92.6 fL (ref 80.0–100.0)
Monocytes Absolute: 0.4 10*3/uL (ref 0.1–1.0)
Monocytes Relative: 4 %
Neutro Abs: 7.4 10*3/uL (ref 1.7–7.7)
Neutrophils Relative %: 74 %
Platelets: 181 10*3/uL (ref 150–400)
RBC: 4.57 MIL/uL (ref 4.22–5.81)
RDW: 14.5 % (ref 11.5–15.5)
WBC: 9.8 10*3/uL (ref 4.0–10.5)
nRBC: 0 % (ref 0.0–0.2)

## 2021-08-14 LAB — COMPREHENSIVE METABOLIC PANEL
ALT: 20 U/L (ref 0–44)
AST: 25 U/L (ref 15–41)
Albumin: 3.9 g/dL (ref 3.5–5.0)
Alkaline Phosphatase: 65 U/L (ref 38–126)
Anion gap: 8 (ref 5–15)
BUN: 14 mg/dL (ref 8–23)
CO2: 31 mmol/L (ref 22–32)
Calcium: 9.2 mg/dL (ref 8.9–10.3)
Chloride: 99 mmol/L (ref 98–111)
Creatinine, Ser: 0.69 mg/dL (ref 0.61–1.24)
GFR, Estimated: >60 mL/min (ref >60)
Glucose, Bld: 165 mg/dL — ABNORMAL HIGH (ref 70–99)
Potassium: 2.9 mmol/L — ABNORMAL LOW (ref 3.5–5.1)
Sodium: 138 mmol/L (ref 135–145)
Total Bilirubin: 0.9 mg/dL (ref 0.3–1.2)
Total Protein: 6.4 g/dL — ABNORMAL LOW (ref 6.5–8.1)

## 2021-08-14 LAB — LACTATE DEHYDROGENASE: LDH: 134 U/L (ref 98–192)

## 2021-08-14 LAB — MAGNESIUM: Magnesium: 1.9 mg/dL (ref 1.7–2.4)

## 2021-08-14 LAB — URIC ACID: Uric Acid, Serum: 6.1 mg/dL (ref 3.7–8.6)

## 2021-08-14 LAB — PHOSPHORUS: Phosphorus: 3 mg/dL (ref 2.5–4.6)

## 2021-08-14 MED ORDER — FAMOTIDINE IN NACL 20-0.9 MG/50ML-% IV SOLN
20.0000 mg | Freq: Once | INTRAVENOUS | Status: AC
  Administered 2021-08-14: 20 mg via INTRAVENOUS
  Filled 2021-08-14: qty 50

## 2021-08-14 MED ORDER — DOXORUBICIN HCL CHEMO IV INJECTION 2 MG/ML
40.0000 mg/m2 | Freq: Once | INTRAVENOUS | Status: AC
  Administered 2021-08-14: 92 mg via INTRAVENOUS
  Filled 2021-08-14: qty 46

## 2021-08-14 MED ORDER — POTASSIUM CHLORIDE ER 10 MEQ PO TBCR: 20.0000 meq | EXTENDED_RELEASE_TABLET | Freq: Every day | ORAL | 2 refills | Status: DC

## 2021-08-14 MED ORDER — SODIUM CHLORIDE 0.9 % IV SOLN: Freq: Once | INTRAVENOUS | Status: AC

## 2021-08-14 MED ORDER — SODIUM CHLORIDE 0.9% FLUSH
10.0000 mL | INTRAVENOUS | Status: DC | PRN
  Administered 2021-08-14: 10 mL

## 2021-08-14 MED ORDER — ACETAMINOPHEN 325 MG PO TABS
650.0000 mg | ORAL_TABLET | Freq: Once | ORAL | Status: AC
  Administered 2021-08-14: 650 mg via ORAL
  Filled 2021-08-14: qty 2

## 2021-08-14 MED ORDER — POTASSIUM CHLORIDE CRYS ER 20 MEQ PO TBCR
40.0000 meq | EXTENDED_RELEASE_TABLET | Freq: Once | ORAL | Status: AC
  Administered 2021-08-14: 40 meq via ORAL
  Filled 2021-08-14: qty 2

## 2021-08-14 MED ORDER — PALONOSETRON HCL INJECTION 0.25 MG/5ML
0.2500 mg | Freq: Once | INTRAVENOUS | Status: AC
  Administered 2021-08-14: 0.25 mg via INTRAVENOUS
  Filled 2021-08-14: qty 5

## 2021-08-14 MED ORDER — SODIUM CHLORIDE 0.9 % IV SOLN
600.0000 mg/m2 | Freq: Once | INTRAVENOUS | Status: AC
  Administered 2021-08-14: 1380 mg via INTRAVENOUS
  Filled 2021-08-14: qty 69

## 2021-08-14 MED ORDER — SODIUM CHLORIDE 0.9 % IV SOLN
150.0000 mg | Freq: Once | INTRAVENOUS | Status: AC
  Administered 2021-08-14: 150 mg via INTRAVENOUS
  Filled 2021-08-14: qty 5

## 2021-08-14 MED ORDER — DIPHENHYDRAMINE HCL 25 MG PO CAPS
50.0000 mg | ORAL_CAPSULE | Freq: Once | ORAL | Status: AC
  Administered 2021-08-14: 50 mg via ORAL
  Filled 2021-08-14: qty 2

## 2021-08-14 MED ORDER — VINCRISTINE SULFATE CHEMO INJECTION 1 MG/ML
1.0000 mg | Freq: Once | INTRAVENOUS | Status: AC
  Administered 2021-08-14: 1 mg via INTRAVENOUS
  Filled 2021-08-14: qty 1

## 2021-08-14 MED ORDER — SODIUM CHLORIDE 0.9 % IV SOLN
10.0000 mg | Freq: Once | INTRAVENOUS | Status: AC
  Administered 2021-08-14: 10 mg via INTRAVENOUS
  Filled 2021-08-14: qty 10

## 2021-08-14 MED ORDER — HEPARIN SOD (PORK) LOCK FLUSH 100 UNIT/ML IV SOLN
500.0000 [IU] | Freq: Once | INTRAVENOUS | Status: AC | PRN
  Administered 2021-08-14: 500 [IU]

## 2021-08-14 MED ORDER — SODIUM CHLORIDE 0.9 % IV SOLN
375.0000 mg/m2 | Freq: Once | INTRAVENOUS | Status: AC
  Administered 2021-08-14: 900 mg via INTRAVENOUS
  Filled 2021-08-14: qty 50

## 2021-08-14 NOTE — Progress Notes (Signed)
Pt presents today for RCHOP per provider's order.Vital signs and labs WNL for treatment today. Pt's potassium is 2.9 today per Dr.K pt will receive potassium chloride 40 mEq p.o x 1 dose. Dr. Raliegh Ip dose reduced all chemotherapy by 20% and vincristine 1 mg.  ? ?R-CHOP and potassium chloride 40 mEq p.o x 1 dose given today per MD orders. Tolerated infusion without adverse affects. Vital signs stable. No complaints at this time. Discharged from clinic via wheelchairin stable condition. Alert and oriented x 3. F/U with [facility] as scheduled.   ?

## 2021-08-14 NOTE — Progress Notes (Signed)
Patient has been examined by Dr. Katragadda, and vital signs and labs have been reviewed. ANC, Creatinine, LFTs, hemoglobin, and platelets are within treatment parameters per M.D. - pt may proceed with treatment.    °

## 2021-08-14 NOTE — Patient Instructions (Signed)
[place] at [facility] ?Discharge Instructions ? ? ?You were seen and examined today by Dr. Delton Coombes. ? ?He reviewed your lab work which is normal/stable. ? ?We will proceed with your final treatment today. ? ?We will repeat scans prior to your next visit.  ? ?Return as scheduled.  ? ? ?Thank you for choosing [place] at [facility] to provide your oncology and hematology care.  To afford each patient quality time with our provider, please arrive at least 15 minutes before your scheduled appointment time.  ? ?If you have a lab appointment with the [facility] please come in thru the Main Entrance and check in at the main information desk. ? ?You need to re-schedule your appointment should you arrive 10 or more minutes late.  We strive to give you quality time with our providers, and arriving late affects you and other patients whose appointments are after yours.  Also, if you no show three or more times for appointments you may be dismissed from the clinic at the providers discretion.     ?Again, thank you for choosing [facility].  Our hope is that these requests will decrease the amount of time that you wait before being seen by our physicians.       ?_____________________________________________________________ ? ?Should you have questions after your visit to [facility], please contact our office at [phone] and follow the prompts.  Our office hours are 8:00 a.m. and 4:30 p.m. Monday - Friday.  Please note that voicemails left after 4:00 p.m. may not be returned until the following business day.  We are closed weekends and major holidays.  You do have access to a nurse 24-7, just call the main number to the clinic [phone] and do not press any options, hold on the line and a nurse will answer the phone.   ? ?For prescription refill requests, have your pharmacy contact our office and allow 72 hours.   ? ?Due to Covid, you  will need to wear a mask upon entering the hospital. If you do not have a mask, a mask will be given to you at the Main Entrance upon arrival. For doctor visits, patients may have 1 support person age 66 or older with them. For treatment visits, patients can not have anyone with them due to social distancing guidelines and our immunocompromised population.  ? ?   ?

## 2021-08-14 NOTE — Patient Instructions (Signed)
[facility]  Discharge Instructions: ?Thank you for choosing [facility] to provide your oncology and hematology care.  ?If you have a lab appointment with the [facility], please come in thru the Main Entrance and check in at the main information desk. ? ?Wear comfortable clothing and clothing appropriate for easy access to any Portacath or PICC line.  ? ?We strive to give you quality time with your provider. You may need to reschedule your appointment if you arrive late (15 or more minutes).  Arriving late affects you and other patients whose appointments are after yours.  Also, if you miss three or more appointments without notifying the office, you may be dismissed from the clinic at the provider?s discretion.    ?  ?For prescription refill requests, have your pharmacy contact our office and allow 72 hours for refills to be completed.   ? ?Today you received the following chemotherapy and/or immunotherapy agents R-CHOP ?  ?To help prevent nausea and vomiting after your treatment, we encourage you to take your nausea medication as directed. ? ?BELOW ARE SYMPTOMS THAT SHOULD BE REPORTED IMMEDIATELY: ?*FEVER GREATER THAN 100.4 F (38 ?C) OR HIGHER ?*CHILLS OR SWEATING ?*NAUSEA AND VOMITING THAT IS NOT CONTROLLED WITH YOUR NAUSEA MEDICATION ?*UNUSUAL SHORTNESS OF BREATH ?*UNUSUAL BRUISING OR BLEEDING ?*URINARY PROBLEMS (pain or burning when urinating, or frequent urination) ?*BOWEL PROBLEMS (unusual diarrhea, constipation, pain near the anus) ?TENDERNESS IN MOUTH AND THROAT WITH OR WITHOUT PRESENCE OF ULCERS (sore throat, sores in mouth, or a toothache) ?UNUSUAL RASH, SWELLING OR PAIN  ?UNUSUAL VAGINAL DISCHARGE OR ITCHING  ? ?Items with * indicate a potential emergency and should be followed up as soon as possible or go to the Emergency Department if any problems should occur. ? ?Please show the CHEMOTHERAPY ALERT CARD or IMMUNOTHERAPY ALERT CARD at check-in to the Emergency Department  and triage nurse. ? ?Should you have questions after your visit or need to cancel or reschedule your appointment, please contact [facility] [phone]  and follow the prompts.  Office hours are 8:00 a.m. to 4:30 p.m. Monday - Friday. Please note that voicemails left after 4:00 p.m. may not be returned until the following business day.  We are closed weekends and major holidays. You have access to a nurse at all times for urgent questions. Please call the main number to the clinic [phone] and follow the prompts. ? ?For any non-urgent questions, you may also contact your provider using MyChart. We now offer e-Visits for anyone 54 and older to request care online for non-urgent symptoms. For details visit [email or web address]. ?  ?Also download the MyChart app! Go to the app store, search "MyChart", open the app, select [facility], and log in with your MyChart username and password. ? ?Due to Covid, a mask is required upon entering the hospital/clinic. If you do not have a mask, one will be given to you upon arrival. For doctor visits, patients may have 1 support person aged 60 or older with them. For treatment visits, patients cannot have anyone with them due to current Covid guidelines and our immunocompromised population.  ?

## 2021-08-14 NOTE — Progress Notes (Signed)
? ?[facility] ?[address] ?[place], [place] [address] ? ? ?CLINIC:  ?Medical Oncology/Hematology ? ?PCP:  ?Bridget Hartshorn, NP ?[place] / [place] [address] ?[phone] ? ? ?REASON FOR VISIT:  ?Follow-up for diffuse large B-cell lymphoma, stage IVb ? ?PRIOR THERAPY: none ? ?NGS Results: not done ? ?CURRENT THERAPY: R-CHOP q21d ? ? ?BRIEF ONCOLOGIC HISTORY:  ?Oncology History  ?Large B-cell lymphoma ([facility])  ?03/22/2021 Initial Diagnosis  ? DLBCL (diffuse large B cell lymphoma) ([facility]) ?  ?03/22/2021 Cancer Staging  ? Staging form: Hodgkin and Non-Hodgkin Lymphoma, AJCC 8th Edition ?- Clinical stage from 03/22/2021: Stage IV (Diffuse large B-cell lymphoma) - Signed by Derek Jack, MD on 03/22/2021 ? ?  ?04/12/2021 -  Chemotherapy  ? Patient is on Treatment Plan : NON-HODGKINS LYMPHOMA R-CHOP q21d  ?   ? ? ?CANCER STAGING: ? Cancer Staging  ?Large B-cell lymphoma ([facility]) ?Staging form: Hodgkin and Non-Hodgkin Lymphoma, AJCC 8th Edition ?- Clinical stage from 03/22/2021: Stage IV (Diffuse large B-cell lymphoma) - Signed by Derek Jack, MD on 03/22/2021 ? ? ?INTERVAL HISTORY:  ?[name], a 71 y.o. male, returns for routine follow-up and consideration for next cycle of chemotherapy. Enrique was last seen on 07/26/2021. ? ?Due for cycle #6 of R-CHOP today.  ? ?Overall, he tells me he has been feeling pretty well. He reports numbness and tingling in his left foot which is causing difficulty to balance while walking; he reports this has been present prior to treatment and is stable. He also reports slight numbness in his right foot. He does not take potassium tablets. He continues to take Gabapentin BID. His Coumadin was switched to Eliquis. He reports recurrent episodes of shingles in his eyes bilaterally.  ? ?Overall, he feels ready for next cycle of chemo today.  ? ? ?REVIEW OF SYSTEMS:  ?Review of Systems  ?Constitutional:  Negative for appetite change and  fatigue.  ?Genitourinary:  Positive for bladder incontinence.   ?Neurological:  Positive for numbness.  ?All other systems reviewed and are negative. ? ?PAST MEDICAL/SURGICAL HISTORY:  ?Past Medical History:  ?Diagnosis Date  ? Atrial fibrillation ([facility])   ? GERD (gastroesophageal reflux disease)   ? High cholesterol   ? Hypertension   ? Obesity   ? ?Past Surgical History:  ?Procedure Laterality Date  ? APPENDECTOMY    ? CATARACT EXTRACTION Bilateral 2022  ? CHOLECYSTECTOMY    ? ESOPHAGEAL DILATION    ? multiple times  ? IR IMAGING GUIDED PORT INSERTION  03/31/2021  ? JOINT REPLACEMENT Left   ? hip  ? ? ?SOCIAL HISTORY:  ?Social History  ? ?Socioeconomic History  ? Marital status: Widowed  ?  Spouse name: Not on file  ? Number of children: 3  ? Years of education: Not on file  ? Highest education level: Not on file  ?Occupational History  ? Occupation: Retired  ?Tobacco Use  ? Smoking status: Never  ? Smokeless tobacco: Never  ?Vaping Use  ? Vaping Use: Never used  ?Substance and Sexual Activity  ? Alcohol use: No  ? Drug use: No  ? Sexual activity: Not Currently  ?Other Topics Concern  ? Not on file  ?Social History Narrative  ? Not on file  ? ?Social Determinants of Health  ? ?Financial Resource Strain: Low Risk   ? Difficulty of Paying Living Expenses: Not hard at all  ?Food Insecurity: No Food Insecurity  ? Worried About Estate manager/land agent  of Food in the Last Year: Never true  ? Ran Out of Food in the Last Year: Never true  ?Transportation Needs: No Transportation Needs  ? Lack of Transportation (Medical): No  ? Lack of Transportation (Non-Medical): No  ?Physical Activity: Sufficiently Active  ? Days of Exercise per Week: 5 days  ? Minutes of Exercise per Session: 30 min  ?Stress: No Stress Concern Present  ? Feeling of Stress : Not at all  ?Social Connections: Moderately Isolated  ? Frequency of Communication with Friends and Family: More than three times a week  ? Frequency of Social Gatherings with Friends and  Family: Three times a week  ? Attends Religious Services: 1 to 4 times per year  ? Active Member of Clubs or Organizations: No  ? Attends Archivist Meetings: Never  ? Marital Status: Widowed  ?Intimate Partner Violence: Not At Risk  ? Fear of Current or Ex-Partner: No  ? Emotionally Abused: No  ? Physically Abused: No  ? Sexually Abused: No  ? ? ?FAMILY HISTORY:  ?Family History  ?Problem Relation Age of Onset  ? Heart failure Father   ? Heart attack Father   ?     Deceased   ? Thyroid disease Sister   ? ? ?CURRENT MEDICATIONS:  ?Current Outpatient Medications  ?Medication Sig Dispense Refill  ? acetaminophen (TYLENOL) 325 MG tablet Take 2 tablets (650 mg total) by mouth every 6 (six) hours as needed for headache. 30 tablet 1  ? benzonatate (TESSALON) 200 MG capsule Take 1 capsule (200 mg total) by mouth 3 (three) times daily as needed for cough. 20 capsule 0  ? Calcium Carbonate-Vitamin D (CALCIUM-VITAMIN D) 600-125 MG-UNIT TABS Take by mouth.    ? Cholecalciferol (VITAMIN D3) 25 MCG (1000 UT) CAPS Take by mouth daily.    ? cyclobenzaprine (FLEXERIL) 5 MG tablet Take 1 tablet (5 mg total) by mouth 3 (three) times daily as needed for muscle spasms. 30 tablet 0  ? CYCLOPHOSPHAMIDE IV Inject into the vein every 21 ( twenty-one) days.    ? DOXORUBICIN HCL IV Inject into the vein every 21 ( twenty-one) days.    ? ELIQUIS 5 MG TABS tablet Take 5 mg by mouth 2 (two) times daily.    ? EQUATE STOOL SOFTENER 100 MG capsule Take 2 capsules by mouth twice daily 120 capsule 0  ? erythromycin ophthalmic ointment SMARTSIG:In Eye(s)    ? FEROSUL 325 (65 Fe) MG tablet Take 1 tablet by mouth once daily with breakfast 30 tablet 0  ? fludrocortisone (FLORINEF) 0.1 MG tablet Take 1 tablet (0.1 mg total) by mouth daily with breakfast. 90 tablet 1  ? gabapentin (NEURONTIN) 300 MG capsule Take 1 capsule (300 mg total) by mouth 2 (two) times daily. 60 capsule 6  ? guaiFENesin (MUCINEX) 600 MG 12 hr tablet Take 1 tablet (600 mg  total) by mouth 2 (two) times daily as needed. 30 tablet 0  ? Lactulose 20 GM/30ML SOLN Take 30 ml by mouth every 3 hours until a bowel movement occurs.  Then take 30 ml by mouth at bedtime. (Patient taking differently: Take 30 mLs by mouth See admin instructions. Take 30 ml by mouth every 3 hours until a bowel movement occurs.  Then take 30 ml by mouth at bedtime.) 450 mL 2  ? levothyroxine (SYNTHROID) 88 MCG tablet Take 1 tablet (88 mcg total) by mouth daily before breakfast. 90 tablet 1  ? lidocaine (XYLOCAINE) 2 % jelly Apply 1  application. topically as needed (pain).    ? loperamide (IMODIUM) 2 MG capsule Take 1 capsule (2 mg total) by mouth as needed for diarrhea or loose stools. 30 capsule 0  ? magnesium oxide (MAG-OX) 400 (240 Mg) MG tablet Take 1 tablet (400 mg total) by mouth 2 (two) times daily. 60 tablet 1  ? midodrine (PROAMATINE) 5 MG tablet TAKE 2 TABLETS EACH MORNING, 1 TABLET EACH AFTERNOON AND 1 TABLET EACH EVENING. (Patient taking differently: 5 mg 3 (three) times daily with meals.) 360 tablet 0  ? omeprazole (PRILOSEC) 10 MG capsule Take 1 capsule by mouth daily.    ? oxyCODONE (OXY IR/ROXICODONE) 5 MG immediate release tablet Take 1 tablet (5 mg total) by mouth every 6 (six) hours as needed for severe pain or breakthrough pain. 10 tablet 0  ? prednisoLONE acetate (PRED FORTE) 1 % ophthalmic suspension Place into the left eye.    ? predniSONE (DELTASONE) 10 MG tablet Take 1 tablet (10 mg total) by mouth daily with breakfast. 95 tablet 1  ? predniSONE (DELTASONE) 20 MG tablet Take 100 mg (5 tablets) by mouth daily starting on each day of chemotherapy treatment, then 4 days after.  Repeat every 3 weeks. Do not take on the weeks you are not receiving treatment. 150 tablet 0  ? psyllium (REGULOID) 0.52 g capsule Take by mouth.    ? riTUXimab in sodium chloride 0.9 % 250 mL Inject into the vein every 21 ( twenty-one) days.    ? rosuvastatin (CRESTOR) 5 MG tablet Take 1 tablet by mouth at bedtime.    ?  tamsulosin (FLOMAX) 0.4 MG CAPS capsule Take 1 capsule by mouth daily.    ? trifluridine (VIROPTIC) 1 % ophthalmic solution SMARTSIG:In Eye(s)    ? valACYclovir (VALTREX) 500 MG tablet Take 500 mg by mouth

## 2021-08-16 ENCOUNTER — Inpatient Hospital Stay (HOSPITAL_COMMUNITY): Payer: Medicare Other

## 2021-08-16 ENCOUNTER — Encounter (HOSPITAL_COMMUNITY): Payer: Self-pay

## 2021-08-16 VITALS — BP 145/69 | HR 50 | Temp 98.0°F | Resp 20

## 2021-08-16 DIAGNOSIS — C8510 Unspecified B-cell lymphoma, unspecified site: Secondary | ICD-10-CM

## 2021-08-16 DIAGNOSIS — Z5112 Encounter for antineoplastic immunotherapy: Secondary | ICD-10-CM | POA: Diagnosis not present

## 2021-08-16 MED ORDER — PEGFILGRASTIM-BMEZ 6 MG/0.6ML ~~LOC~~ SOSY
6.0000 mg | PREFILLED_SYRINGE | Freq: Once | SUBCUTANEOUS | Status: AC
  Administered 2021-08-16: 6 mg via SUBCUTANEOUS
  Filled 2021-08-16: qty 0.6

## 2021-08-16 NOTE — Patient Instructions (Signed)
[facility]  Discharge Instructions: ?Thank you for choosing [facility] to provide your oncology and hematology care.  ?If you have a lab appointment with the [facility], please come in thru the Main Entrance and check in at the main information desk. ? ?Wear comfortable clothing and clothing appropriate for easy access to any Portacath or PICC line.  ? ?We strive to give you quality time with your provider. You may need to reschedule your appointment if you arrive late (15 or more minutes).  Arriving late affects you and other patients whose appointments are after yours.  Also, if you miss three or more appointments without notifying the office, you may be dismissed from the clinic at the provider?s discretion.    ?  ?For prescription refill requests, have your pharmacy contact our office and allow 72 hours for refills to be completed.   ? ?Today you received the following [place], return as scheduled. ?  ?To help prevent nausea and vomiting after your treatment, we encourage you to take your nausea medication as directed. ? ?BELOW ARE SYMPTOMS THAT SHOULD BE REPORTED IMMEDIATELY: ?*FEVER GREATER THAN 100.4 F (38 ?C) OR HIGHER ?*CHILLS OR SWEATING ?*NAUSEA AND VOMITING THAT IS NOT CONTROLLED WITH YOUR NAUSEA MEDICATION ?*UNUSUAL SHORTNESS OF BREATH ?*UNUSUAL BRUISING OR BLEEDING ?*URINARY PROBLEMS (pain or burning when urinating, or frequent urination) ?*BOWEL PROBLEMS (unusual diarrhea, constipation, pain near the anus) ?TENDERNESS IN MOUTH AND THROAT WITH OR WITHOUT PRESENCE OF ULCERS (sore throat, sores in mouth, or a toothache) ?UNUSUAL RASH, SWELLING OR PAIN  ?UNUSUAL VAGINAL DISCHARGE OR ITCHING  ? ?Items with * indicate a potential emergency and should be followed up as soon as possible or go to the Emergency Department if any problems should occur. ? ?Please show the CHEMOTHERAPY ALERT CARD or IMMUNOTHERAPY ALERT CARD at check-in to the Emergency Department and triage  nurse. ? ?Should you have questions after your visit or need to cancel or reschedule your appointment, please contact [facility] [phone]  and follow the prompts.  Office hours are 8:00 a.m. to 4:30 p.m. Monday - Friday. Please note that voicemails left after 4:00 p.m. may not be returned until the following business day.  We are closed weekends and major holidays. You have access to a nurse at all times for urgent questions. Please call the main number to the clinic [phone] and follow the prompts. ? ?For any non-urgent questions, you may also contact your provider using MyChart. We now offer e-Visits for anyone 78 and older to request care online for non-urgent symptoms. For details visit [email or web address]. ?  ?Also download the MyChart app! Go to the app store, search "MyChart", open the app, select [facility], and log in with your MyChart username and password. ? ?Due to Covid, a mask is required upon entering the hospital/clinic. If you do not have a mask, one will be given to you upon arrival. For doctor visits, patients may have 1 support person aged 40 or older with them. For treatment visits, patients cannot have anyone with them due to current Covid guidelines and our immunocompromised population.  ?

## 2021-08-16 NOTE — Progress Notes (Signed)
Patient tolerated injection with no complaints voiced. Site clean and dry with no bruising or swelling noted at site. See MAR for details. Band aid applied.  Patient stable during and after injection. VSS with discharge and left in satisfactory condition with no s/s of distress noted.  

## 2021-09-06 ENCOUNTER — Telehealth: Payer: Self-pay | Admitting: "Endocrinology

## 2021-09-06 DIAGNOSIS — E2740 Unspecified adrenocortical insufficiency: Secondary | ICD-10-CM

## 2021-09-06 MED ORDER — LEVOTHYROXINE SODIUM 88 MCG PO TABS: 88.0000 ug | ORAL_TABLET | Freq: Every day | ORAL | 0 refills | Status: DC

## 2021-09-06 MED ORDER — FLUDROCORTISONE ACETATE 0.1 MG PO TABS: 0.1000 mg | ORAL_TABLET | Freq: Every day | ORAL | 0 refills | Status: DC

## 2021-09-06 MED ORDER — MIDODRINE HCL 5 MG PO TABS: 5.0000 mg | ORAL_TABLET | Freq: Three times a day (TID) | ORAL | 0 refills | Status: DC

## 2021-09-06 NOTE — Telephone Encounter (Signed)
Pt is switching pharmacy and is needing his medication sent to new pharmacy.  ? ?fludrocortisone (FLORINEF) 0.1 MG tablet ? ?levothyroxine (SYNTHROID) 88 MCG tablet ? ?midodrine (PROAMATINE) 5 MG tablet  ? ?[place], [place] Phone:  [phone]  ?Fax:  [phone]  ?  ? ?

## 2021-09-06 NOTE — Telephone Encounter (Signed)
Rx refills sent to [facility]. ?

## 2021-09-13 ENCOUNTER — Ambulatory Visit
Admission: EM | Admit: 2021-09-13 | Discharge: 2021-09-13 | Disposition: A | Discharge status: Home / Self Care | Payer: Medicare Other | Attending: Family Medicine | Admitting: Family Medicine

## 2021-09-13 DIAGNOSIS — J069 Acute upper respiratory infection, unspecified: Secondary | ICD-10-CM

## 2021-09-13 DIAGNOSIS — S39012A Strain of muscle, fascia and tendon of lower back, initial encounter: Secondary | ICD-10-CM | POA: Diagnosis not present

## 2021-09-13 DIAGNOSIS — R030 Elevated blood-pressure reading, without diagnosis of hypertension: Secondary | ICD-10-CM

## 2021-09-13 MED ORDER — BENZONATATE 200 MG PO CAPS
200.0000 mg | ORAL_CAPSULE | Freq: Three times a day (TID) | ORAL | 0 refills | Status: DC | PRN
Start: 2021-09-13 — End: 2021-10-04

## 2021-09-13 MED ORDER — GUAIFENESIN ER 600 MG PO TB12: 600.0000 mg | ORAL_TABLET | Freq: Two times a day (BID) | ORAL | 0 refills | Status: DC | PRN

## 2021-09-13 MED ORDER — AMLODIPINE BESYLATE 2.5 MG PO TABS: 2.5000 mg | ORAL_TABLET | Freq: Every day | ORAL | 0 refills | Status: DC

## 2021-09-13 NOTE — Discharge Instructions (Addendum)
Try Coricidin HBP for your cold symptoms.  Follow-up with your blood pressures drop too low or your breathing becomes worse ?

## 2021-09-13 NOTE — ED Triage Notes (Signed)
Pt states that since last Sunday he started having congestion with cough  ? ?Pt states he also is having pain in lower left back that started yesterday ? ?Pt states he has tried Tylenol and it does take the pain away in his back ?

## 2021-09-13 NOTE — ED Provider Notes (Signed)
?[facility] ? ? ? ?CSN: [id number] ?Arrival date & time: 09/13/21  0809 ? ? ?  ? ?History   ?Chief Complaint ?Chief Complaint  ?Patient presents with  ? Back Pain  ?  Low back pain and congestion  ? ? ?HPI ?Roy [name] is a 71 y.o. male.  ? ?Patient with complicated medical history to include atrial fibrillation, first-degree AV block, hypertension, adrenal insufficiency, hypothyroidism, large B-cell lymphoma, hyperlipidemia presenting today with multiple concerns.  He states he has been working with his primary care provider over the past several weeks on elevated blood pressure readings around 160s over 100s.  He has been unable to obtain an appointment for the past several weeks, last called them yesterday and was told that it would be several weeks before he can get in for an appointment.  He used to be on high blood pressure medication but was taken off a year or so ago per patient and placed on midodrine for hypotension.  The past several weeks he has stopped taking this medication as his blood pressures were ranging too high but he states that his pressures have not improved since stopping the midodrine.  He denies chest pain, shortness of breath, dizziness, headaches, visual change.  He is also the past 4 days having cough, congestion, scratchy throat.  Denies fever, chills, chest pain, shortness of breath, abdominal pain, nausea vomiting diarrhea, sick contacts.  He has no known history of chronic pulmonary disease.  No known sick contacts recently.  Not trying anything over-the-counter for the symptoms.  He also states that he was lifting some heavy tires and stacking them earlier this week and is now having some left-sided low back soreness and stiffness.  Denies radiation of pain, numbness, tingling, weakness, bowel or bladder incontinence, saddle anesthesia.  Taking some Tylenol with mild relief of symptoms. ? ? ?Past Medical History:  ?Diagnosis Date  ? Atrial fibrillation ([facility])   ? GERD  (gastroesophageal reflux disease)   ? High cholesterol   ? Hypertension   ? Obesity   ? ? ?Patient Active Problem List  ? Diagnosis Date Noted  ? Sepsis due to undetermined organism ([facility]) 07/14/2021  ? Neutropenic fever ([facility]) 07/14/2021  ? Lactic acidosis 07/14/2021  ? Hypothyroidism 07/14/2021  ? Hypotension 07/14/2021  ? Hyperlipidemia 07/14/2021  ? BPH (benign prostatic hyperplasia) 07/14/2021  ? Diarrhea 07/14/2021  ? GERD (gastroesophageal reflux disease) 07/14/2021  ? Leukopenia   ? Thrombocytopenia ([facility])   ? Large B-cell lymphoma ([facility]) 03/22/2021  ? Subacute thyroiditis 01/20/2021  ? Adrenal insufficiency ([facility]) 01/10/2021  ? Hyponatremia 01/08/2021  ? Weakness 01/08/2021  ? Hyperthyroidism 10/22/2020  ? Coagulopathy ([facility]) 10/21/2020  ? Cellulitis 03/16/2018  ? Obesity (BMI 30-39.9) 05/04/2015  ? Chronic anticoagulation 05/04/2015  ? Trifascicular block 04/29/2015  ? LAFB (left anterior fascicular block) 04/29/2015  ? Essential hypertension   ? Hypokalemia 04/28/2015  ? RBBB 04/28/2015  ? First degree AV block 04/28/2015  ? PAF (paroxysmal atrial fibrillation) ([facility]) 04/27/2015  ? Dizziness 04/27/2015  ? ? ?Past Surgical History:  ?Procedure Laterality Date  ? APPENDECTOMY    ? CATARACT EXTRACTION Bilateral 2022  ? CHOLECYSTECTOMY    ? ESOPHAGEAL DILATION    ? multiple times  ? IR IMAGING GUIDED PORT INSERTION  03/31/2021  ? JOINT REPLACEMENT Left   ? hip  ? ? ? ? ? ?Home Medications   ? ?Prior to Admission medications   ?Medication Sig Start Date End Date Taking? Authorizing Provider  ?  amLODipine (NORVASC) 2.5 MG tablet Take 1 tablet (2.5 mg total) by mouth daily. 09/13/21  Yes Volney American, PA-C  ?benzonatate (TESSALON) 200 MG capsule Take 1 capsule (200 mg total) by mouth 3 (three) times daily as needed for cough. 09/13/21  Yes Volney American, PA-C  ?guaiFENesin (MUCINEX) 600 MG 12 hr tablet Take 1 tablet (600 mg total) by mouth 2 (two) times daily as needed. 09/13/21  Yes Volney American, PA-C  ?acetaminophen (TYLENOL) 325 MG tablet Take 2 tablets (650 mg total) by mouth every 6 (six) hours as needed for headache. 01/13/21   Roxan Hockey, MD  ?benzonatate (TESSALON) 200 MG capsule Take 1 capsule (200 mg total) by mouth 3 (three) times daily as needed for cough. 06/26/21   Volney American, PA-C  ?Calcium Carbonate-Vitamin D (CALCIUM-VITAMIN D) 600-125 MG-UNIT TABS Take by mouth.    [provider]  ?Cholecalciferol (VITAMIN D3) 25 MCG (1000 UT) CAPS Take by mouth daily.    [provider]  ?cyclobenzaprine (FLEXERIL) 5 MG tablet Take 1 tablet (5 mg total) by mouth 3 (three) times daily as needed for muscle spasms. 07/17/21   Manuella Ghazi, Pratik D, DO  ?CYCLOPHOSPHAMIDE IV Inject into the vein every 21 ( twenty-one) days. 04/12/21   [provider]  ?DOXORUBICIN HCL IV Inject into the vein every 21 ( twenty-one) days. 04/12/21   [provider]  ?ELIQUIS 5 MG TABS tablet Take 5 mg by mouth 2 (two) times daily. 08/03/21   [provider]  ?EQUATE STOOL SOFTENER 100 MG capsule Take 2 capsules by mouth twice daily 08/13/21   Tarri Abernethy M, PA-C  ?erythromycin ophthalmic ointment SMARTSIG:In Eye(s) 07/21/21   [provider]  ?FEROSUL 325 (65 Fe) MG tablet Take 1 tablet by mouth once daily with breakfast 07/30/21   Tarri Abernethy M, PA-C  ?fludrocortisone (FLORINEF) 0.1 MG tablet Take 1 tablet (0.1 mg total) by mouth daily with breakfast. 09/06/21   Nida, Marella Chimes, MD  ?gabapentin (NEURONTIN) 300 MG capsule Take 1 capsule (300 mg total) by mouth 2 (two) times daily. 07/05/21   Derek Jack, MD  ?guaiFENesin (MUCINEX) 600 MG 12 hr tablet Take 1 tablet (600 mg total) by mouth 2 (two) times daily as needed. 06/26/21   [facility] American, PA-C  ?HYDROcodone-acetaminophen (NORCO/VICODIN) 5-325 MG tablet Take 1 tablet by mouth every 8 (eight) hours as needed. 08/03/21   [provider]  ?Lactulose 20 GM/30ML SOLN Take 30  ml by mouth every 3 hours until a bowel movement occurs.  Then take 30 ml by mouth at bedtime. ?Patient taking differently: Take 30 mLs by mouth See admin instructions. Take 30 ml by mouth every 3 hours until a bowel movement occurs.  Then take 30 ml by mouth at bedtime. 07/13/21   Derek Jack, MD  ?levothyroxine (SYNTHROID) 88 MCG tablet Take 1 tablet (88 mcg total) by mouth daily before breakfast. 09/06/21   Nida, Marella Chimes, MD  ?lidocaine (XYLOCAINE) 2 % jelly Apply 1 application. topically as needed (pain). 01/16/21 01/16/22  [provider]  ?lidocaine-prilocaine (EMLA) cream Apply a small amount to port a cath site and cover with plastic wrap 1 hour prior to chemotherapy appointments 04/07/21   Derek Jack, MD  ?loperamide (IMODIUM) 2 MG capsule Take 1 capsule (2 mg total) by mouth as needed for diarrhea or loose stools. 07/17/21   Manuella Ghazi, Pratik D, DO  ?magnesium oxide (MAG-OX) 400 (240 Mg) MG tablet Take 1 tablet (400 mg  total) by mouth 2 (two) times daily. 10/24/20   [name], MD  ?midodrine (PROAMATINE) 5 MG tablet Take 1 tablet (5 mg total) by mouth 3 (three) times daily with meals. 09/06/21   Cassandria Anger, MD  ?omeprazole (PRILOSEC) 10 MG capsule Take 1 capsule by mouth daily. 11/15/20   [provider]  ?oxyCODONE (OXY IR/ROXICODONE) 5 MG immediate release tablet Take 1 tablet (5 mg total) by mouth every 6 (six) hours as needed for severe pain or breakthrough pain. 07/17/21   Manuella Ghazi, Pratik D, DO  ?potassium chloride (KLOR-CON) 10 MEQ tablet Take 2 tablets (20 mEq total) by mouth daily. 08/14/21   Derek Jack, MD  ?prednisoLONE acetate (PRED FORTE) 1 % ophthalmic suspension Place into the left eye. 04/14/21   [provider]  ?predniSONE (DELTASONE) 10 MG tablet Take 1 tablet (10 mg total) by mouth daily with breakfast. 03/02/21   Nida, Marella Chimes, MD  ?predniSONE (DELTASONE) 20 MG tablet Take 100 mg (5 tablets) by mouth daily starting on each day  of chemotherapy treatment, then 4 days after.  Repeat every 3 weeks. Do not take on the weeks you are not receiving treatment. 04/07/21   Derek Jack, MD  ?prochlorperazine (COMPAZINE) 10 MG tablet

## 2021-09-21 ENCOUNTER — Inpatient Hospital Stay (HOSPITAL_COMMUNITY): Payer: Medicare Other | Attending: Hematology

## 2021-09-21 ENCOUNTER — Encounter (HOSPITAL_COMMUNITY)
Admission: RE | Admit: 2021-09-21 | Discharge: 2021-09-21 | Disposition: A | Discharge status: Home / Self Care | Payer: Medicare Other | Source: Ambulatory Visit | Attending: Hematology | Admitting: Hematology

## 2021-09-21 ENCOUNTER — Encounter (HOSPITAL_COMMUNITY): Payer: Self-pay

## 2021-09-21 DIAGNOSIS — E876 Hypokalemia: Secondary | ICD-10-CM | POA: Diagnosis not present

## 2021-09-21 DIAGNOSIS — R109 Unspecified abdominal pain: Secondary | ICD-10-CM | POA: Diagnosis not present

## 2021-09-21 DIAGNOSIS — R5383 Other fatigue: Secondary | ICD-10-CM | POA: Insufficient documentation

## 2021-09-21 DIAGNOSIS — Z7901 Long term (current) use of anticoagulants: Secondary | ICD-10-CM | POA: Insufficient documentation

## 2021-09-21 DIAGNOSIS — G629 Polyneuropathy, unspecified: Secondary | ICD-10-CM | POA: Diagnosis not present

## 2021-09-21 DIAGNOSIS — I10 Essential (primary) hypertension: Secondary | ICD-10-CM | POA: Insufficient documentation

## 2021-09-21 DIAGNOSIS — Z79899 Other long term (current) drug therapy: Secondary | ICD-10-CM | POA: Diagnosis not present

## 2021-09-21 DIAGNOSIS — C8338 Diffuse large B-cell lymphoma, lymph nodes of multiple sites: Secondary | ICD-10-CM | POA: Insufficient documentation

## 2021-09-21 DIAGNOSIS — I4891 Unspecified atrial fibrillation: Secondary | ICD-10-CM | POA: Diagnosis not present

## 2021-09-21 DIAGNOSIS — Z7952 Long term (current) use of systemic steroids: Secondary | ICD-10-CM | POA: Diagnosis not present

## 2021-09-21 DIAGNOSIS — D110 Benign neoplasm of parotid gland: Secondary | ICD-10-CM | POA: Diagnosis not present

## 2021-09-21 DIAGNOSIS — C8330 Diffuse large B-cell lymphoma, unspecified site: Secondary | ICD-10-CM | POA: Insufficient documentation

## 2021-09-21 HISTORY — DX: Malignant (primary) neoplasm, unspecified: C80.1

## 2021-09-21 LAB — COMPREHENSIVE METABOLIC PANEL
ALT: 27 U/L (ref 0–44)
AST: 33 U/L (ref 15–41)
Albumin: 4.3 g/dL (ref 3.5–5.0)
Alkaline Phosphatase: 71 U/L (ref 38–126)
Anion gap: 9 (ref 5–15)
BUN: 13 mg/dL (ref 8–23)
CO2: 32 mmol/L (ref 22–32)
Calcium: 9.7 mg/dL (ref 8.9–10.3)
Chloride: 100 mmol/L (ref 98–111)
Creatinine, Ser: 0.74 mg/dL (ref 0.61–1.24)
GFR, Estimated: >60 mL/min (ref >60)
Glucose, Bld: 94 mg/dL (ref 70–99)
Potassium: 3.5 mmol/L (ref 3.5–5.1)
Sodium: 141 mmol/L (ref 135–145)
Total Bilirubin: 1.1 mg/dL (ref 0.3–1.2)
Total Protein: 7.2 g/dL (ref 6.5–8.1)

## 2021-09-21 LAB — CBC WITH DIFFERENTIAL/PLATELET
Abs Immature Granulocytes: 0.21 10*3/uL — ABNORMAL HIGH (ref 0.00–0.07)
Basophils Absolute: 0.1 10*3/uL (ref 0.0–0.1)
Basophils Relative: 1 %
Eosinophils Absolute: 0.3 10*3/uL (ref 0.0–0.5)
Eosinophils Relative: 3 %
HCT: 48.6 % (ref 39.0–52.0)
Hemoglobin: 15.5 g/dL (ref 13.0–17.0)
Immature Granulocytes: 2 %
Lymphocytes Relative: 29 %
Lymphs Abs: 2.7 10*3/uL (ref 0.7–4.0)
MCH: 28.9 pg (ref 26.0–34.0)
MCHC: 31.9 g/dL (ref 30.0–36.0)
MCV: 90.5 fL (ref 80.0–100.0)
Monocytes Absolute: 0.6 10*3/uL (ref 0.1–1.0)
Monocytes Relative: 7 %
Neutro Abs: 5.5 10*3/uL (ref 1.7–7.7)
Neutrophils Relative %: 58 %
Platelets: 195 10*3/uL (ref 150–400)
RBC: 5.37 MIL/uL (ref 4.22–5.81)
RDW: 15 % (ref 11.5–15.5)
WBC: 9.4 10*3/uL (ref 4.0–10.5)
nRBC: 0 % (ref 0.0–0.2)

## 2021-09-21 LAB — PHOSPHORUS: Phosphorus: 4.1 mg/dL (ref 2.5–4.6)

## 2021-09-21 LAB — URIC ACID: Uric Acid, Serum: 6.3 mg/dL (ref 3.7–8.6)

## 2021-09-21 LAB — MAGNESIUM: Magnesium: 2.3 mg/dL (ref 1.7–2.4)

## 2021-09-21 LAB — LACTATE DEHYDROGENASE: LDH: 238 U/L — ABNORMAL HIGH (ref 98–192)

## 2021-09-21 MED ORDER — FLUDEOXYGLUCOSE F - 18 (FDG) INJECTION
13.4000 | Freq: Once | INTRAVENOUS | Status: AC | PRN
  Administered 2021-09-21: 11.79 via INTRAVENOUS

## 2021-09-27 ENCOUNTER — Encounter (HOSPITAL_COMMUNITY): Payer: Self-pay

## 2021-09-27 ENCOUNTER — Emergency Department (HOSPITAL_COMMUNITY): Payer: Medicare Other

## 2021-09-27 ENCOUNTER — Encounter (HOSPITAL_COMMUNITY): Payer: Self-pay | Admitting: Hematology

## 2021-09-27 ENCOUNTER — Emergency Department (HOSPITAL_COMMUNITY)
Admission: EM | Admit: 2021-09-27 | Discharge: 2021-09-27 | Disposition: A | Discharge status: Home / Self Care | Payer: Medicare Other | Attending: Emergency Medicine | Admitting: Emergency Medicine

## 2021-09-27 ENCOUNTER — Other Ambulatory Visit: Payer: Self-pay

## 2021-09-27 ENCOUNTER — Inpatient Hospital Stay (HOSPITAL_BASED_OUTPATIENT_CLINIC_OR_DEPARTMENT_OTHER): Payer: Medicare Other | Admitting: Hematology

## 2021-09-27 VITALS — BP 84/59 | HR 90 | Temp 97.0°F | Resp 16 | Wt 237.7 lb

## 2021-09-27 DIAGNOSIS — C8338 Diffuse large B-cell lymphoma, lymph nodes of multiple sites: Secondary | ICD-10-CM

## 2021-09-27 DIAGNOSIS — R112 Nausea with vomiting, unspecified: Secondary | ICD-10-CM | POA: Diagnosis not present

## 2021-09-27 DIAGNOSIS — R1012 Left upper quadrant pain: Secondary | ICD-10-CM | POA: Diagnosis not present

## 2021-09-27 DIAGNOSIS — R197 Diarrhea, unspecified: Secondary | ICD-10-CM | POA: Insufficient documentation

## 2021-09-27 DIAGNOSIS — R1013 Epigastric pain: Secondary | ICD-10-CM | POA: Diagnosis not present

## 2021-09-27 DIAGNOSIS — R1010 Upper abdominal pain, unspecified: Secondary | ICD-10-CM

## 2021-09-27 LAB — CBC WITH DIFFERENTIAL/PLATELET
Band Neutrophils: 3 %
Basophils Absolute: 0.1 10*3/uL (ref 0.0–0.1)
Basophils Relative: 1 %
Eosinophils Absolute: 0.3 10*3/uL (ref 0.0–0.5)
Eosinophils Relative: 2 %
HCT: 46.9 % (ref 39.0–52.0)
Hemoglobin: 15.5 g/dL (ref 13.0–17.0)
Lymphocytes Relative: 27 %
Lymphs Abs: 3.4 10*3/uL (ref 0.7–4.0)
MCH: 28.8 pg (ref 26.0–34.0)
MCHC: 33 g/dL (ref 30.0–36.0)
MCV: 87.2 fL (ref 80.0–100.0)
Monocytes Absolute: 0.9 10*3/uL (ref 0.1–1.0)
Monocytes Relative: 7 %
Neutro Abs: 8 10*3/uL — ABNORMAL HIGH (ref 1.7–7.7)
Neutrophils Relative %: 60 %
Platelets: 208 10*3/uL (ref 150–400)
RBC: 5.38 MIL/uL (ref 4.22–5.81)
RDW: 14.9 % (ref 11.5–15.5)
WBC: 12.7 10*3/uL — ABNORMAL HIGH (ref 4.0–10.5)
nRBC: 0 % (ref 0.0–0.2)

## 2021-09-27 LAB — POC OCCULT BLOOD, ED: Occult Blood, Stool #1: NEGATIVE

## 2021-09-27 LAB — COMPREHENSIVE METABOLIC PANEL
ALT: 38 U/L (ref 0–44)
AST: 47 U/L — ABNORMAL HIGH (ref 15–41)
Albumin: 3.7 g/dL (ref 3.5–5.0)
Alkaline Phosphatase: 112 U/L (ref 38–126)
Anion gap: 10 (ref 5–15)
BUN: 21 mg/dL (ref 8–23)
CO2: 29 mmol/L (ref 22–32)
Calcium: 9.4 mg/dL (ref 8.9–10.3)
Chloride: 98 mmol/L (ref 98–111)
Creatinine, Ser: 0.94 mg/dL (ref 0.61–1.24)
GFR, Estimated: >60 mL/min (ref >60)
Glucose, Bld: 104 mg/dL — ABNORMAL HIGH (ref 70–99)
Potassium: 3 mmol/L — ABNORMAL LOW (ref 3.5–5.1)
Sodium: 137 mmol/L (ref 135–145)
Total Bilirubin: 2.1 mg/dL — ABNORMAL HIGH (ref 0.3–1.2)
Total Protein: 6.9 g/dL (ref 6.5–8.1)

## 2021-09-27 LAB — URINALYSIS, ROUTINE W REFLEX MICROSCOPIC
Bilirubin Urine: NEGATIVE
Glucose, UA: NEGATIVE mg/dL
Hgb urine dipstick: NEGATIVE
Ketones, ur: NEGATIVE mg/dL
Leukocytes,Ua: NEGATIVE
Nitrite: NEGATIVE
Protein, ur: NEGATIVE mg/dL
Specific Gravity, Urine: 1.045 — ABNORMAL HIGH (ref 1.005–1.030)
pH: 5 (ref 5.0–8.0)

## 2021-09-27 LAB — LIPASE, BLOOD: Lipase: 20 U/L (ref 11–51)

## 2021-09-27 MED ORDER — HEPARIN SOD (PORK) LOCK FLUSH 100 UNIT/ML IV SOLN
500.0000 [IU] | Freq: Once | INTRAVENOUS | Status: AC
  Administered 2021-09-27: 500 [IU]
  Filled 2021-09-27: qty 5

## 2021-09-27 MED ORDER — MORPHINE SULFATE (PF) 4 MG/ML IV SOLN
4.0000 mg | Freq: Once | INTRAVENOUS | Status: AC
  Administered 2021-09-27: 4 mg via INTRAVENOUS
  Filled 2021-09-27: qty 1

## 2021-09-27 MED ORDER — ONDANSETRON HCL 4 MG/2ML IJ SOLN
4.0000 mg | Freq: Once | INTRAMUSCULAR | Status: AC
  Administered 2021-09-27: 4 mg via INTRAVENOUS
  Filled 2021-09-27: qty 2

## 2021-09-27 MED ORDER — SODIUM CHLORIDE 0.9 % IV BOLUS
1000.0000 mL | Freq: Once | INTRAVENOUS | Status: AC
  Administered 2021-09-27: 1000 mL via INTRAVENOUS

## 2021-09-27 MED ORDER — ONDANSETRON HCL 4 MG PO TABS: 4.0000 mg | ORAL_TABLET | Freq: Three times a day (TID) | ORAL | 0 refills | Status: DC | PRN

## 2021-09-27 MED ORDER — OMEPRAZOLE 20 MG PO CPDR: 20.0000 mg | DELAYED_RELEASE_CAPSULE | Freq: Every day | ORAL | 0 refills | Status: DC

## 2021-09-27 MED ORDER — POTASSIUM CHLORIDE CRYS ER 20 MEQ PO TBCR
40.0000 meq | EXTENDED_RELEASE_TABLET | Freq: Once | ORAL | Status: AC
  Administered 2021-09-27: 40 meq via ORAL
  Filled 2021-09-27: qty 2

## 2021-09-27 MED ORDER — FLUDROCORTISONE ACETATE 0.1 MG PO TABS
0.1000 mg | ORAL_TABLET | Freq: Every day | ORAL | Status: DC
  Administered 2021-09-27: 0.1 mg via ORAL
  Filled 2021-09-27 (×3): qty 1

## 2021-09-27 MED ORDER — IOHEXOL 300 MG/ML  SOLN
100.0000 mL | Freq: Once | INTRAMUSCULAR | Status: AC | PRN
  Administered 2021-09-27: 100 mL via INTRAVENOUS

## 2021-09-27 NOTE — ED Provider Notes (Signed)
Patient signed out to me by Evalee Jefferson, PA-C pending completion of IV fluids and orthostatic vital signs  See previous provider note for complete history and physical  Patient with history of Addison's disease and lymphoma currently in remission, but continues care with oncology here with upper abdominal pain, sent by cancer center for evaluation.  Had some vomiting last evening.  He was noted to have a soft blood pressure, did not take his Florinef today.  He was given dose here and IV fluids.  On my exam, patient reports feeling much better after receiving his fluids states he is ready for discharge home.  He was given dose of IV pain medication and antiemetic here.  Orthostatics reassuring.  Last blood pressure 126/61.  Patient will follow-up with PCP.  Prescription for PPI and antiemetic sent to pharmacy of record.   Kem Parkinson, PA-C 09/27/21 2232    Fredia Sorrow, MD 10/13/21 [phone]

## 2021-09-27 NOTE — ED Triage Notes (Signed)
Patient reports abdominal pain since Sunday. States that he vomited last night and felt some better. Brought from cancer center for eval.

## 2021-09-27 NOTE — ED Notes (Signed)
Patient provided fluids/crackers at this time for PO challenge.

## 2021-09-27 NOTE — Progress Notes (Signed)
[address], [place] [address]   CLINIC:  Medical Oncology/Hematology  PCP:  Bridget Hartshorn, NP [address] / [place] [address] [phone]   REASON FOR VISIT:  Follow-up for diffuse large B-cell lymphoma, stage IVb  PRIOR THERAPY: none  NGS Results: not done  CURRENT THERAPY: R-CHOP q21d  BRIEF ONCOLOGIC HISTORY:  Oncology History  Large B-cell lymphoma ([facility] [place])  03/22/2021 Initial Diagnosis   DLBCL (diffuse large B cell lymphoma) ([facility])    03/22/2021 Cancer Staging   Staging form: Hodgkin and Non-Hodgkin Lymphoma, AJCC 8th Edition - Clinical stage from 03/22/2021: Stage IV (Diffuse large B-cell lymphoma) - Signed by Derek Jack, MD on 03/22/2021    04/12/2021 -  Chemotherapy   Patient is on Treatment Plan : NON-HODGKINS LYMPHOMA R-CHOP q21d        CANCER STAGING:  Cancer Staging  Large B-cell lymphoma ([place]) Staging form: Hodgkin and Non-Hodgkin Lymphoma, AJCC 8th Edition - Clinical stage from 03/22/2021: Stage IV (Diffuse large B-cell lymphoma) - Signed by Derek Jack, MD on 03/22/2021   INTERVAL HISTORY:  Roy [name], a 71 y.o. male, returns for routine follow-up of his diffuse large B-cell lymphoma, stage IVb. Roy [name] was last seen on 08/14/2021.   Today Roy [name] reports feeling fair. Roy [name] reports constant severe sharp abdominal pain starting on 5/21 which has been preventing him from sleeping. Roy [name] denies constipation, but Roy [name] reports 1 episode of black diarrhea last night. Roy [name] reports 1 episode of vomiting last night, and Roy [name] reports his vomit was yellow. Roy [name] was started on 2.5 mg Norvasc on 5/10.   REVIEW OF SYSTEMS:  Review of Systems  Constitutional:  Positive for fatigue. Negative for appetite change.  Gastrointestinal:  Positive for abdominal pain (10/10) and diarrhea (x1). Negative for constipation.       Black stools  Neurological:  Positive for dizziness, headaches and numbness.   Psychiatric/Behavioral:  Positive for sleep disturbance.   All other systems reviewed and are negative.  PAST MEDICAL/SURGICAL HISTORY:  Past Medical History:  Diagnosis Date   Atrial fibrillation (HCC)    Cancer (HCC)    Diffuse Large B-Cell lymphoma   GERD (gastroesophageal reflux disease)    High cholesterol    Hypertension    Obesity    Past Surgical History:  Procedure Laterality Date   APPENDECTOMY     CATARACT EXTRACTION Bilateral 2022   CHOLECYSTECTOMY     ESOPHAGEAL DILATION     multiple times   IR IMAGING GUIDED PORT INSERTION  03/31/2021   JOINT REPLACEMENT Left    hip    SOCIAL HISTORY:  Social History   Socioeconomic History   Marital status: Widowed    Spouse name: Not on file   Number of children: 3   Years of education: Not on file   Highest education level: Not on file  Occupational History   Occupation: Retired  Tobacco Use   Smoking status: Never    Passive exposure: Never   Smokeless tobacco: Never  Vaping Use   Vaping Use: Never used  Substance and Sexual Activity   Alcohol use: No   Drug use: No   Sexual activity: Not Currently  Other Topics Concern   Not on file  Social History Narrative   Not on file   Social Determinants of Health   Financial Resource Strain: Low Risk    Difficulty of Paying Living Expenses: Not hard at all  Food Insecurity: No Food  Insecurity   Worried About Charity fundraiser in the Last Year: Never true   [place] in the Last Year: Never true  Transportation Needs: No Transportation Needs   Lack of Transportation (Medical): No   Lack of Transportation (Non-Medical): No  Physical Activity: Sufficiently Active   Days of Exercise per Week: 5 days   Minutes of Exercise per Session: 30 min  Stress: No Stress Concern Present   Feeling of Stress : Not at all  Social Connections: Moderately Isolated   Frequency of Communication with Friends and Family: More than three times a week   Frequency of Social  Gatherings with Friends and Family: Three times a week   Attends Religious Services: 1 to 4 times per year   Active Member of Clubs or Organizations: No   Attends Archivist Meetings: Never   Marital Status: Widowed  Human resources officer Violence: Not At Risk   Fear of Current or Ex-Partner: No   Emotionally Abused: No   Physically Abused: No   Sexually Abused: No    FAMILY HISTORY:  Family History  Problem Relation Age of Onset   Heart failure Father    Heart attack Father        Deceased    Thyroid disease Sister     CURRENT MEDICATIONS:  Current Outpatient Medications  Medication Sig Dispense Refill   acetaminophen (TYLENOL) 325 MG tablet Take 2 tablets (650 mg total) by mouth every 6 (six) hours as needed for headache. 30 tablet 1   amLODipine (NORVASC) 2.5 MG tablet Take 1 tablet (2.5 mg total) by mouth daily. 14 tablet 0   azithromycin (ZITHROMAX) 250 MG tablet TAKE 2 TABLETS BY MOUTH ON DAY 1, THEN TAKE 1 TABLET DAILY ON DAYS 2-5     benzonatate (TESSALON) 200 MG capsule Take 1 capsule (200 mg total) by mouth 3 (three) times daily as needed for cough. 20 capsule 0   benzonatate (TESSALON) 200 MG capsule Take 1 capsule (200 mg total) by mouth 3 (three) times daily as needed for cough. 20 capsule 0   Calcium Carbonate-Vitamin D (CALCIUM-VITAMIN D) 600-125 MG-UNIT TABS Take by mouth.     Cholecalciferol (VITAMIN D3) 25 MCG (1000 UT) CAPS Take by mouth daily.     cyclobenzaprine (FLEXERIL) 5 MG tablet Take 1 tablet (5 mg total) by mouth 3 (three) times daily as needed for muscle spasms. 30 tablet 0   ELIQUIS 5 MG TABS tablet Take 5 mg by mouth 2 (two) times daily.     EQUATE STOOL SOFTENER 100 MG capsule Take 2 capsules by mouth twice daily 120 capsule 0   erythromycin ophthalmic ointment SMARTSIG:In Eye(s)     FEROSUL 325 (65 Fe) MG tablet Take 1 tablet by mouth once daily with breakfast 30 tablet 0   fludrocortisone (FLORINEF) 0.1 MG tablet Take 1 tablet (0.1 mg total)  by mouth daily with breakfast. 90 tablet 0   gabapentin (NEURONTIN) 300 MG capsule Take 1 capsule (300 mg total) by mouth 2 (two) times daily. 60 capsule 6   guaiFENesin (MUCINEX) 600 MG 12 hr tablet Take 1 tablet (600 mg total) by mouth 2 (two) times daily as needed. 30 tablet 0   HYDROcodone-acetaminophen (NORCO/VICODIN) 5-325 MG tablet Take 1 tablet by mouth every 8 (eight) hours as needed.     Lactulose 20 GM/30ML SOLN Take 30 ml by mouth every 3 hours until a bowel movement occurs.  Then take 30 ml by mouth at bedtime. (  Patient taking differently: Take 30 mLs by mouth See admin instructions. Take 30 ml by mouth every 3 hours until a bowel movement occurs.  Then take 30 ml by mouth at bedtime.) 450 mL 2   levothyroxine (SYNTHROID) 88 MCG tablet Take 1 tablet (88 mcg total) by mouth daily before breakfast. 90 tablet 0   lidocaine (XYLOCAINE) 2 % jelly Apply 1 application. topically as needed (pain).     loperamide (IMODIUM) 2 MG capsule Take 1 capsule (2 mg total) by mouth as needed for diarrhea or loose stools. 30 capsule 0   magnesium oxide (MAG-OX) 400 (240 Mg) MG tablet Take 1 tablet (400 mg total) by mouth 2 (two) times daily. 60 tablet 1   midodrine (PROAMATINE) 5 MG tablet Take 1 tablet (5 mg total) by mouth 3 (three) times daily with meals. 360 tablet 0   omeprazole (PRILOSEC) 10 MG capsule Take 1 capsule by mouth daily.     oxyCODONE (OXY IR/ROXICODONE) 5 MG immediate release tablet Take 1 tablet (5 mg total) by mouth every 6 (six) hours as needed for severe pain or breakthrough pain. 10 tablet 0   potassium chloride (KLOR-CON) 10 MEQ tablet Take 2 tablets (20 mEq total) by mouth daily. 60 tablet 2   prednisoLONE acetate (PRED FORTE) 1 % ophthalmic suspension Place into the left eye.     predniSONE (DELTASONE) 10 MG tablet Take 1 tablet (10 mg total) by mouth daily with breakfast. 95 tablet 1   predniSONE (DELTASONE) 20 MG tablet Take 100 mg (5 tablets) by mouth daily starting on each day  of chemotherapy treatment, then 4 days after.  Repeat every 3 weeks. Do not take on the weeks you are not receiving treatment. 150 tablet 0   psyllium (REGULOID) 0.52 g capsule Take by mouth.     riTUXimab in sodium chloride 0.9 % 250 mL Inject into the vein every 21 ( twenty-one) days.     rosuvastatin (CRESTOR) 5 MG tablet Take 1 tablet by mouth at bedtime.     tamsulosin (FLOMAX) 0.4 MG CAPS capsule Take 1 capsule by mouth daily.     trifluridine (VIROPTIC) 1 % ophthalmic solution SMARTSIG:In Eye(s)     valACYclovir (VALTREX) 500 MG tablet Take 500 mg by mouth 2 (two) times daily.     VINCRISTINE SULFATE IV Inject into the vein every 21 ( twenty-one) days.     vitamin B-12 (CYANOCOBALAMIN) 1000 MCG tablet Take 1 tablet (1,000 mcg total) by mouth daily. 30 tablet 3   lidocaine-prilocaine (EMLA) cream Apply a small amount to port a cath site and cover with plastic wrap 1 hour prior to chemotherapy appointments (Patient not taking: Reported on 09/27/2021) 30 g 3   prochlorperazine (COMPAZINE) 10 MG tablet Take 1 tablet (10 mg total) by mouth every 6 (six) hours as needed (Nausea or vomiting). (Patient not taking: Reported on 09/27/2021) 30 tablet 6   No current facility-administered medications for this visit.    ALLERGIES:  Allergies  Allergen Reactions   Adhesive [Tape]     Contact dermatitis   Sulfa Antibiotics     UNKNOWN Childhood reaction    PHYSICAL EXAM:  Performance status (ECOG): 0 - Asymptomatic  Vitals:   09/27/21 1111  BP: (!) 84/59  Pulse: 90  Resp: 16  Temp: (!) 97 F (36.1 C)  SpO2: 97%   Wt Readings from Last 3 Encounters:  09/27/21 237 lb 10.5 oz (107.8 kg)  08/14/21 240 lb 6.4 oz (109 kg)  07/26/21  237 lb 6.4 oz (107.7 kg)   Physical Exam Vitals reviewed.  Constitutional:      Appearance: Normal appearance. Roy [name] is obese.  Cardiovascular:     Rate and Rhythm: Normal rate and regular rhythm.     Pulses: Normal pulses.     Heart sounds: Normal heart  sounds.  Pulmonary:     Effort: Pulmonary effort is normal.     Breath sounds: Normal breath sounds.  Abdominal:     Palpations: Abdomen is soft. There is no mass.     Tenderness: There is abdominal tenderness in the epigastric area and left upper quadrant.  Neurological:     General: No focal deficit present.     Mental Status: Roy [name] is alert and oriented to person, place, and time.  Psychiatric:        Mood and Affect: Mood normal.        Behavior: Behavior normal.     LABORATORY DATA:  I have reviewed the labs as listed.     Latest Ref Rng & Units 09/21/2021    8:02 AM 08/14/2021    8:00 AM 07/26/2021    7:45 AM  CBC  WBC 4.0 - 10.5 K/uL 9.4   9.8   7.0    Hemoglobin 13.0 - 17.0 g/dL 15.5   13.4   12.1    Hematocrit 39.0 - 52.0 % 48.6   42.3   38.5    Platelets 150 - 400 K/uL 195   181   347        Latest Ref Rng & Units 09/21/2021    8:02 AM 08/14/2021    8:00 AM 07/26/2021    7:45 AM  CMP  Glucose 70 - 99 mg/dL 94   165   113    BUN 8 - 23 mg/dL 13   14   12     Creatinine 0.61 - 1.24 mg/dL 0.74   0.69   0.77    Sodium 135 - 145 mmol/L 141   138   137    Potassium 3.5 - 5.1 mmol/L 3.5   2.9   3.8    Chloride 98 - 111 mmol/L 100   99   98    CO2 22 - 32 mmol/L 32   31   28    Calcium 8.9 - 10.3 mg/dL 9.7   9.2   9.2    Total Protein 6.5 - 8.1 g/dL 7.2   6.4   6.6    Total Bilirubin 0.3 - 1.2 mg/dL 1.1   0.9   0.5    Alkaline Phos 38 - 126 U/L 71   65   69    AST 15 - 41 U/L 33   25   24    ALT 0 - 44 U/L 27   20   14       DIAGNOSTIC IMAGING:  I have independently reviewed the scans and discussed with the patient. NM PET Image Restag (PS) Skull Base To Thigh  Result Date: 09/22/2021 CLINICAL DATA:  Subsequent treatment strategy for large B-cell lymphoma. EXAM: NUCLEAR MEDICINE PET SKULL BASE TO THIGH TECHNIQUE: 11.8 mCi F-18 FDG was injected intravenously. Full-ring PET imaging was performed from the skull base to thigh after the radiotracer. CT data was obtained and  used for attenuation correction and anatomic localization. Fasting blood glucose: 79 mg/dl COMPARISON:  06/08/2021 and CT neck 03/28/2021. FINDINGS: Mediastinal blood pool activity: SUV max 2.5 Liver activity: SUV max 3.8 NECK: Hypermetabolic right  parotid nodules measure up to 0.9 x 1.5 cm (3/73) and SUV max 11.9, stable. Medial left parotid nodule is stable, measuring 11 x 17 mm and SUV max 11.9 compared to 10.0 previously. No hypermetabolic lymph nodes. Incidental CT findings: None. CHEST: No abnormal hypermetabolism. Incidental CT findings: Right IJ Port-A-Cath terminates at the SVC RA junction. Atherosclerotic calcification of the aorta, aortic valve and coronary arteries. Enlarged pulmonic trunk and heart. No pericardial or pleural effusion. ABDOMEN/PELVIS: No abnormal hypermetabolism in the liver, adrenal glands, spleen or pancreas. No hypermetabolic nodes. Incidental CT findings: Liver margin is slightly irregular with mild hypertrophy of the left hepatic lobe and caudate. Cholecystectomy. Adrenal glands, kidneys, spleen, pancreas, stomach and bowel are grossly unremarkable. Prostate is enlarged. SKELETON: No abnormal hypermetabolism. Incidental CT findings: Left hip arthroplasty.  Degenerative changes in the spine. IMPRESSION: 1. No evidence metabolically active lymphoma (Deauville 1). 2. Hypermetabolic bilateral parotid nodules, stable and thought to represent possible Warthin's tumors on CT neck 03/28/2021. 3. Suspect cirrhosis. 4. Enlarged prostate. 5. Aortic atherosclerosis (ICD10-I70.0). Coronary artery calcification. 6. Enlarged pulmonic trunk, indicative of pulmonary arterial hypertension. Electronically Signed   By: Lorin Picket M.D.   On: 09/22/2021 14:10     ASSESSMENT:  1.  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   High-grade lymphoma panel was negative. - Weight loss of 40 pounds in the last 4 months, stable weight over the last 1 month.  No fevers or night sweats.  -PET CT scan on 03/16/2021 which showed hypermetabolic enlarged spleen.  There are hypermetabolic neck lymph nodes.  Diffuse hypermetabolic activity within the marrow space of the spine.  Diffuse groundglass density within the lungs with moderate activity. - 6 cycles of R-CHOP from 04/13/2021 through 08/14/2021 - PET scan (09/22/2021): No evidence of metabolically active lymphoma, Deauville 1.    2.  Social/family history: - Roy [name] lives at home by himself.  His son is present today with him. - Roy [name] is independent of ADLs and IADLs.  Son lives within 30 minutes.  Roy [name] was never smoker. - Roy [name] has work-related exposure to dyes.  No pesticide exposure. - Brother died of bone cancer.  Another brother also died of bone cancer.   PLAN:  1.  Diffuse large B-cell lymphoma, stage IVb: - Roy [name] has done very well with last cycle of chemotherapy. - We reviewed PET scan from 09/22/2021 which showed Deauville 1 response.  Hypermetabolic bilateral parotid nodules, representing possible Warthin's tumor.  Other findings including cirrhosis, enlarged prostate. - Roy [name] reported epigastric pain which started on 09/24/2021 and reported as severe.  Roy [name] also has pain in the left upper quadrant.  Roy [name] had tarry diarrhea yesterday.  Roy [name] vomited once.  His blood pressure is low at 84/54.  Roy [name] reports starting on Norvasc 2.5 mg daily recently. - His labs from 09/21/2021 were normal.  LDH was minimally elevated. - Because of sudden changes in symptomatology, I have recommended evaluation in the ER with further testing and imaging if needed.    2.  Parotid masses: - Posttreatment PET CT scan showed bilateral hypermetabolic parotid nodules, clinically consistent with Warthin's tumors. - We will consider ENT evaluation at next visit.    3.  Peripheral neuropathy: - Left foot numbness is greater  than right foot numbness.  Continue gabapentin 300 mg twice daily..  4.  High risk drug monitoring: - Echo on 01/09/2021  with EF 55-60%.  No signs of PND or orthopnea.  5.  Hypokalemia: - Continue potassium 20 mEq daily.   Orders placed this encounter:  No orders of the defined types were placed in this encounter.    Derek Jack, MD [place] [phone]   I, Thana Ates, am acting as a scribe for Dr. Derek Jack.  I, Derek Jack MD, have reviewed the above documentation for accuracy and completeness, and I agree with the above.

## 2021-09-27 NOTE — ED Notes (Signed)
PA at bedside.

## 2021-09-27 NOTE — ED Notes (Signed)
Back from CT

## 2021-09-27 NOTE — ED Notes (Signed)
During orthostatics pt had no complaints of feeling dizzy

## 2021-09-27 NOTE — ED Provider Notes (Signed)
[facility] EMERGENCY DEPARTMENT Provider Note   CSN: [id number] Arrival date & time: 09/27/21  1143     History  Chief Complaint  Patient presents with   Abdominal Pain    Roy [name] is a 71 y.o. male with a history of large B-cell lymphoma under the care of Dr. Delton Coombes, having completed a course of chemotherapy and is currently clinically in remission, presenting for evaluation of abdominal pain which started 4 days ago.  He actually saw Dr. Delton Coombes today for office visit at which time he had complaints of this abdominal pain and he was sent here for further evaluation.  He describes a sharp pain in his epigastric region which radiates into his left upper abdomen which has been sharp, persistent and associated with 1 episode of yellow liquid emesis yesterday evening.  He remains nauseated today but without vomiting.  He also had 1 episode of very dark diarrhea last night, this symptom has also resolved.  He has seen no bright red blood per rectum.  He also denies fevers or chills, chest pain, shortness of breath.  His surgical history is significant for cholecystectomy.  He has found no alleviators for symptoms.  The history is provided by the patient.      Home Medications Prior to Admission medications   Medication Sig Start Date End Date Taking? Authorizing Provider  acetaminophen (TYLENOL) 325 MG tablet Take 2 tablets (650 mg total) by mouth every 6 (six) hours as needed for headache. 01/13/21  Yes Emokpae, Courage, MD  Calcium Carbonate-Vitamin D (CALCIUM-VITAMIN D) 600-125 MG-UNIT TABS Take by mouth.   Yes [provider]  Cholecalciferol (VITAMIN D3) 25 MCG (1000 UT) CAPS Take by mouth daily.   Yes [provider]  ELIQUIS 5 MG TABS tablet Take 5 mg by mouth 2 (two) times daily. 08/03/21  Yes [provider]  EQUATE STOOL SOFTENER 100 MG capsule Take 2 capsules by mouth twice daily 08/13/21  Yes Harriett Rush, PA-C  FEROSUL 325 (65 Fe) MG  tablet Take 1 tablet by mouth once daily with breakfast 07/30/21  Yes Pennington, Rebekah M, PA-C  fludrocortisone (FLORINEF) 0.1 MG tablet Take 1 tablet (0.1 mg total) by mouth daily with breakfast. 09/06/21  Yes Nida, Marella Chimes, MD  gabapentin (NEURONTIN) 300 MG capsule Take 1 capsule (300 mg total) by mouth 2 (two) times daily. 07/05/21  Yes Derek Jack, MD  levothyroxine (SYNTHROID) 88 MCG tablet Take 1 tablet (88 mcg total) by mouth daily before breakfast. 09/06/21  Yes Nida, Marella Chimes, MD  loperamide (IMODIUM) 2 MG capsule Take 1 capsule (2 mg total) by mouth as needed for diarrhea or loose stools. 07/17/21  Yes Shah, Pratik D, DO  midodrine (PROAMATINE) 5 MG tablet Take 1 tablet (5 mg total) by mouth 3 (three) times daily with meals. 09/06/21  Yes Nida, Marella Chimes, MD  omeprazole (PRILOSEC) 10 MG capsule Take 1 capsule by mouth daily. 11/15/20  Yes [provider]  potassium chloride (KLOR-CON) 10 MEQ tablet Take 2 tablets (20 mEq total) by mouth daily. 08/14/21  Yes Derek Jack, MD  prednisoLONE acetate (PRED FORTE) 1 % ophthalmic suspension Place into the left eye. 04/14/21  Yes [provider]  predniSONE (DELTASONE) 10 MG tablet Take 1 tablet (10 mg total) by mouth daily with breakfast. 03/02/21  Yes Nida, Marella Chimes, MD  rosuvastatin (CRESTOR) 5 MG tablet Take 1 tablet by mouth at bedtime. 08/23/20  Yes [provider]  tamsulosin (FLOMAX) 0.4 MG  CAPS capsule Take 1 capsule by mouth daily. 02/28/21  Yes [provider]  valACYclovir (VALTREX) 500 MG tablet Take 500 mg by mouth 2 (two) times daily. 07/21/21  Yes [provider]  vitamin B-12 (CYANOCOBALAMIN) 1000 MCG tablet Take 1 tablet (1,000 mcg total) by mouth daily. 02/28/21  Yes Pennington, Rebekah M, PA-C  amLODipine (NORVASC) 2.5 MG tablet Take 1 tablet (2.5 mg total) by mouth daily. Patient not taking: Reported on 09/27/2021 09/13/21   Volney American, PA-C   benzonatate (TESSALON) 200 MG capsule Take 1 capsule (200 mg total) by mouth 3 (three) times daily as needed for cough. Patient not taking: Reported on 09/27/2021 06/26/21   Volney American, PA-C  benzonatate (TESSALON) 200 MG capsule Take 1 capsule (200 mg total) by mouth 3 (three) times daily as needed for cough. Patient not taking: Reported on 09/27/2021 09/13/21   Volney American, PA-C  cyclobenzaprine (FLEXERIL) 5 MG tablet Take 1 tablet (5 mg total) by mouth 3 (three) times daily as needed for muscle spasms. Patient not taking: Reported on 09/27/2021 07/17/21   Heath Lark D, DO  guaiFENesin (MUCINEX) 600 MG 12 hr tablet Take 1 tablet (600 mg total) by mouth 2 (two) times daily as needed. Patient not taking: Reported on 09/27/2021 09/13/21   Volney American, PA-C  Lactulose 20 GM/30ML SOLN Take 30 ml by mouth every 3 hours until a bowel movement occurs.  Then take 30 ml by mouth at bedtime. Patient not taking: Reported on 09/27/2021 07/13/21   Derek Jack, MD  lidocaine-prilocaine (EMLA) cream Apply a small amount to port a cath site and cover with plastic wrap 1 hour prior to chemotherapy appointments Patient not taking: Reported on 09/27/2021 04/07/21   Derek Jack, MD  magnesium oxide (MAG-OX) 400 (240 Mg) MG tablet Take 1 tablet (400 mg total) by mouth 2 (two) times daily. Patient not taking: Reported on 09/27/2021 10/24/20   [name], MD  oxyCODONE (OXY IR/ROXICODONE) 5 MG immediate release tablet Take 1 tablet (5 mg total) by mouth every 6 (six) hours as needed for severe pain or breakthrough pain. Patient not taking: Reported on 09/27/2021 07/17/21   Heath Lark D, DO  prochlorperazine (COMPAZINE) 10 MG tablet Take 1 tablet (10 mg total) by mouth every 6 (six) hours as needed (Nausea or vomiting). Patient not taking: Reported on 09/27/2021 04/07/21   Derek Jack, MD      Allergies    Adhesive [tape] and Sulfa antibiotics    Review of Systems    Review of Systems  Constitutional:  Negative for chills and fever.  HENT:  Negative for congestion and sore throat.   Eyes: Negative.   Respiratory:  Negative for chest tightness and shortness of breath.   Cardiovascular:  Negative for chest pain.  Gastrointestinal:  Positive for abdominal pain, diarrhea, nausea and vomiting.  Genitourinary: Negative.   Musculoskeletal:  Negative for arthralgias, joint swelling and neck pain.  Skin: Negative.  Negative for rash and wound.  Neurological:  Negative for dizziness, weakness, light-headedness, numbness and headaches.  Psychiatric/Behavioral: Negative.    All other systems reviewed and are negative.  Physical Exam Updated Vital Signs BP 110/70   Pulse 63   Temp 98.1 F (36.7 C) (Oral)   Resp 15   Ht '5\' 9"'$  (1.753 m)   Wt 107.8 kg   SpO2 99%   BMI 35.10 kg/m  Physical Exam Vitals and nursing note reviewed.  Constitutional:      Appearance:  He is well-developed.  HENT:     Head: Normocephalic and atraumatic.  Eyes:     Conjunctiva/sclera: Conjunctivae normal.  Cardiovascular:     Rate and Rhythm: Normal rate and regular rhythm.     Heart sounds: Normal heart sounds.  Pulmonary:     Effort: Pulmonary effort is normal.     Breath sounds: Normal breath sounds. No wheezing.  Abdominal:     General: Abdomen is protuberant. Bowel sounds are normal. There is no distension.     Palpations: Abdomen is soft.     Tenderness: There is abdominal tenderness in the epigastric area and left upper quadrant. There is no guarding or rebound.  Musculoskeletal:        General: Normal range of motion.     Cervical back: Normal range of motion.  Skin:    General: Skin is warm and dry.  Neurological:     Mental Status: He is alert.    ED Results / Procedures / Treatments   Labs (all labs ordered are listed, but only abnormal results are displayed) Labs Reviewed  CBC WITH DIFFERENTIAL/PLATELET - Abnormal; Notable for the following  components:      Result Value   WBC 12.7 (*)    Neutro Abs 8.0 (*)    All other components within normal limits  COMPREHENSIVE METABOLIC PANEL - Abnormal; Notable for the following components:   Potassium 3.0 (*)    Glucose, Bld 104 (*)    AST 47 (*)    Total Bilirubin 2.1 (*)    All other components within normal limits  URINALYSIS, ROUTINE W REFLEX MICROSCOPIC - Abnormal; Notable for the following components:   Specific Gravity, Urine 1.045 (*)    All other components within normal limits  LIPASE, BLOOD  POC OCCULT BLOOD, ED    EKG None  Radiology CT ABDOMEN PELVIS W CONTRAST  Result Date: 09/27/2021 CLINICAL DATA:  Left upper quadrant abdominal pain with vomiting. EXAM: CT ABDOMEN AND PELVIS WITH CONTRAST TECHNIQUE: Multidetector CT imaging of the abdomen and pelvis was performed using the standard protocol following bolus administration of intravenous contrast. RADIATION DOSE REDUCTION: This exam was performed according to the departmental dose-optimization program which includes automated exposure control, adjustment of the mA and/or kV according to patient size and/or use of iterative reconstruction technique. CONTRAST:  169m OMNIPAQUE IOHEXOL 300 MG/ML  SOLN COMPARISON:  Multiple priors including most recent PET-CT Sep 22, 2018 CT abdomen pelvis October 21, 2020 FINDINGS: Lower chest: No acute abnormality. Hepatobiliary: Cirrhotic hepatic morphology. No suspicious hepatic lesion. Gallbladder surgically absent. Similar mild prominence of the biliary tree favored reservoir effect post cholecystectomy. Pancreas: No pancreatic ductal dilation or evidence of acute inflammation. Spleen: Splenomegaly or focal splenic lesion. Adrenals/Urinary Tract: Bilateral adrenal glands appear normal. Hypodense left upper pole renal lesion is technically too small to accurately characterize but stable from prior and statistically likely to reflect a cyst which in the absence of clinically indicated  signs/symptoms require no independent follow-up. No suspicious renal mass. Urinary bladder is unremarkable for degree of distension. Stomach/Bowel: No radiopaque enteric contrast material was administered. Stomach is unremarkable for degree of distension. No pathologic dilation of small or large bowel. No evidence of acute bowel inflammation. Sigmoid colonic diverticulosis without findings of acute diverticulitis. Vascular/Lymphatic: Aortic and branch vessel atherosclerosis without abdominal aortic aneurysm. Reproductive: Prostatomegaly. Other: Similar paucity of right abdominal wall musculature. No significant abdominopelvic free fluid. Musculoskeletal: Ankylosis of the lower thoracic and lumbar spine with bilateral sacroiliitis, unchanged from  prior. Left total hip arthroplasty. No acute osseous abnormality. IMPRESSION: 1. No acute abnormality within the abdomen or pelvis. 2. Sigmoid colonic diverticulosis without findings of acute diverticulitis. 3. Cirrhotic hepatic morphology with evidence of portal hypertension. 4. 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 5. Aortic Atherosclerosis (ICD10-I70.0). Electronically Signed   By: Dahlia Bailiff M.D.   On: 09/27/2021 14:57    Procedures Procedures    Medications Ordered in ED Medications  heparin lock flush 100 unit/mL (has no administration in time range)  fludrocortisone (FLORINEF) tablet 0.1 mg (has no administration in time range)  potassium chloride SA (KLOR-CON M) CR tablet 40 mEq (has no administration in time range)  morphine (PF) 4 MG/ML injection 4 mg (4 mg Intravenous Given 09/27/21 1310)  ondansetron (ZOFRAN) injection 4 mg (4 mg Intravenous Given 09/27/21 1302)  iohexol (OMNIPAQUE) 300 MG/ML solution 100 mL (100 mLs Intravenous Contrast Given 09/27/21 1437)  sodium chloride 0.9 % bolus 1,000 mL (1,000 mLs Intravenous New Bag/Given 09/27/21 1846)    ED Course/ Medical Decision Making/  A&P                           Medical Decision Making Patient with epigastric and left upper quadrant abdominal pain along with nausea and vomiting x1 yesterday evening, dark stools x1 yesterday evening.  He is Hemoccult negative today.  He does have a WBC count of 12.7, low potassium of 3.0 and he also has a total bilirubin of 2.1 although his LFTs and lipase are relatively normal.  Of note, he does not have a gallbladder.  He does have some degree of cirrhosis based on his CT imaging, evidence of portal hypertension.  He does not have diverticulitis, bowel obstruction or other intra-abdominal surgical process.  He was given IV fluids here.  Of note he ran low blood pressures here, he has been afebrile and his pulse rates have been normal.  He does state he has chronically low blood pressures under the care of Dr. Dorris Fetch with his history of Addison's disease.  He typically takes Florinef twice daily but has not taken this medication today.  He was given his dose of Florinef here.  He is still receiving IV fluids.  Discussed with Kem Parkinson who assumes care.  I would recommend orthostatic vital signs after active fluids have been given and if his blood pressures are stable he should be able to be disposed home with follow-up care with his PCP.  Amount and/or Complexity of Data Reviewed Labs: ordered. Radiology: ordered.  Risk Prescription drug management.           Final Clinical Impression(s) / ED Diagnoses Final diagnoses:  None    Rx / DC Orders ED Discharge Orders     None         Landis Martins 09/27/21 1940    Fredia Sorrow, MD 10/13/21 1531

## 2021-09-27 NOTE — Discharge Instructions (Addendum)
Please follow-up with your primary care provider for recheck

## 2021-09-28 ENCOUNTER — Telehealth: Payer: Self-pay

## 2021-09-28 NOTE — Telephone Encounter (Signed)
F/u with patient as a result of food security and requesting senior meals to be setup and delivered to his home.   Pt was interviewed successfully and then was advised that his meals would be delivered starting October 05, 2021

## 2021-10-04 ENCOUNTER — Encounter: Payer: Self-pay | Admitting: "Endocrinology

## 2021-10-04 ENCOUNTER — Ambulatory Visit (INDEPENDENT_AMBULATORY_CARE_PROVIDER_SITE_OTHER): Payer: Medicare Other | Admitting: "Endocrinology

## 2021-10-04 VITALS — BP 122/84 | HR 52 | Ht 72.0 in | Wt 243.2 lb

## 2021-10-04 DIAGNOSIS — E2740 Unspecified adrenocortical insufficiency: Secondary | ICD-10-CM

## 2021-10-04 DIAGNOSIS — E876 Hypokalemia: Secondary | ICD-10-CM

## 2021-10-04 DIAGNOSIS — E039 Hypothyroidism, unspecified: Secondary | ICD-10-CM

## 2021-10-04 LAB — TSH: TSH: 10.2 u[IU]/mL — ABNORMAL HIGH (ref 0.450–4.500)

## 2021-10-04 LAB — T4, FREE: Free T4: 1.13 ng/dL (ref 0.82–1.77)

## 2021-10-04 MED ORDER — FLUDROCORTISONE ACETATE 0.1 MG PO TABS: 0.1000 mg | ORAL_TABLET | Freq: Every day | ORAL | 1 refills | Status: DC

## 2021-10-04 MED ORDER — POTASSIUM CHLORIDE ER 10 MEQ PO TBCR: 20.0000 meq | EXTENDED_RELEASE_TABLET | Freq: Two times a day (BID) | ORAL | 1 refills | Status: DC

## 2021-10-04 MED ORDER — LEVOTHYROXINE SODIUM 100 MCG PO TABS: 100.0000 ug | ORAL_TABLET | Freq: Every day | ORAL | 1 refills | Status: DC

## 2021-10-04 NOTE — Progress Notes (Signed)
10/04/2021, 12:30 PM   Subjective:    Patient ID: Roy [name], male    DOB: Jun 29, 1950, PCP Bridget Hartshorn, NP   Past Medical History:  Diagnosis Date   Atrial fibrillation ([place])    Cancer ([place])    Diffuse Large B-Cell lymphoma   GERD (gastroesophageal reflux disease)    High cholesterol    Hypertension    Obesity    Past Surgical History:  Procedure Laterality Date   APPENDECTOMY     CATARACT EXTRACTION Bilateral 2022   CHOLECYSTECTOMY     ESOPHAGEAL DILATION     multiple times   IR IMAGING GUIDED PORT INSERTION  03/31/2021   JOINT REPLACEMENT Left    hip   Social History   Socioeconomic History   Marital status: Widowed    Spouse name: Not on file   Number of children: 3   Years of education: Not on file   Highest education level: Not on file  Occupational History   Occupation: Retired  Tobacco Use   Smoking status: Never    Passive exposure: Never   Smokeless tobacco: Never  Vaping Use   Vaping Use: Never used  Substance and Sexual Activity   Alcohol use: No   Drug use: No   Sexual activity: Not Currently  Other Topics Concern   Not on file  Social History Narrative   Not on file   Social Determinants of Health   Financial Resource Strain: Low Risk    Difficulty of Paying Living Expenses: Not hard at all  Food Insecurity: No Food Insecurity   Worried About Charity fundraiser in the Last Year: Never true   [place] in the Last Year: Never true  Transportation Needs: No Transportation Needs   Lack of Transportation (Medical): No   Lack of Transportation (Non-Medical): No  Physical Activity: Sufficiently Active   Days of Exercise per Week: 5 days   Minutes of Exercise per Session: 30 min  Stress: No Stress Concern Present   Feeling of Stress : Not at all  Social Connections: Moderately Isolated   Frequency of Communication with Friends and Family: More than three times a week    Frequency of Social Gatherings with Friends and Family: Three times a week   Attends Religious Services: 1 to 4 times per year   Active Member of Clubs or Organizations: No   Attends Archivist Meetings: Never   Marital Status: Widowed   Family History  Problem Relation Age of Onset   Heart failure Father    Heart attack Father        Deceased    Thyroid disease Sister    Outpatient Encounter Medications as of 10/04/2021  Medication Sig   Lactulose 20 GM/30ML SOLN Take 30 ml by mouth every 3 hours until a bowel movement occurs.  Then take 30 ml by mouth at bedtime. (Patient taking differently: Take 30 ml by mouth every 3 hours until a bowel movement occurs.  Then take 30 ml by mouth at bedtime as needed)   acetaminophen (TYLENOL) 325 MG tablet Take 2 tablets (650 mg total) by mouth every 6 (six) hours as needed for headache.   amLODipine (NORVASC) 2.5 MG tablet  Take 1 tablet (2.5 mg total) by mouth daily. (Patient not taking: Reported on 09/27/2021)   Calcium Carbonate-Vitamin D (CALCIUM-VITAMIN D) 600-125 MG-UNIT TABS Take by mouth.   Cholecalciferol (VITAMIN D3) 25 MCG (1000 UT) CAPS Take by mouth daily.   cyclobenzaprine (FLEXERIL) 5 MG tablet Take 1 tablet (5 mg total) by mouth 3 (three) times daily as needed for muscle spasms. (Patient not taking: Reported on 09/27/2021)   ELIQUIS 5 MG TABS tablet Take 5 mg by mouth 2 (two) times daily.   EQUATE STOOL SOFTENER 100 MG capsule Take 2 capsules by mouth twice daily   FEROSUL 325 (65 Fe) MG tablet Take 1 tablet by mouth once daily with breakfast   fludrocortisone (FLORINEF) 0.1 MG tablet Take 1 tablet (0.1 mg total) by mouth daily with breakfast.   gabapentin (NEURONTIN) 300 MG capsule Take 1 capsule (300 mg total) by mouth 2 (two) times daily.   levothyroxine (SYNTHROID) 100 MCG tablet Take 1 tablet (100 mcg total) by mouth daily before breakfast.   lidocaine-prilocaine (EMLA) cream Apply a small amount to port a cath site and  cover with plastic wrap 1 hour prior to chemotherapy appointments (Patient not taking: Reported on 09/27/2021)   loperamide (IMODIUM) 2 MG capsule Take 1 capsule (2 mg total) by mouth as needed for diarrhea or loose stools.   midodrine (PROAMATINE) 5 MG tablet Take 1 tablet (5 mg total) by mouth 3 (three) times daily with meals.   omeprazole (PRILOSEC) 20 MG capsule Take 1 capsule (20 mg total) by mouth daily.   ondansetron (ZOFRAN) 4 MG tablet Take 1 tablet (4 mg total) by mouth every 8 (eight) hours as needed for nausea or vomiting.   potassium chloride (KLOR-CON) 10 MEQ tablet Take 2 tablets (20 mEq total) by mouth 2 (two) times daily.   prednisoLONE acetate (PRED FORTE) 1 % ophthalmic suspension Place into the left eye.   predniSONE (DELTASONE) 10 MG tablet Take 1 tablet (10 mg total) by mouth daily with breakfast.   rosuvastatin (CRESTOR) 5 MG tablet Take 1 tablet by mouth at bedtime.   tamsulosin (FLOMAX) 0.4 MG CAPS capsule Take 1 capsule by mouth daily.   valACYclovir (VALTREX) 500 MG tablet Take 500 mg by mouth 2 (two) times daily.   vitamin B-12 (CYANOCOBALAMIN) 1000 MCG tablet Take 1 tablet (1,000 mcg total) by mouth daily.   [DISCONTINUED] benzonatate (TESSALON) 200 MG capsule Take 1 capsule (200 mg total) by mouth 3 (three) times daily as needed for cough. (Patient not taking: Reported on 09/27/2021)   [DISCONTINUED] benzonatate (TESSALON) 200 MG capsule Take 1 capsule (200 mg total) by mouth 3 (three) times daily as needed for cough. (Patient not taking: Reported on 09/27/2021)   [DISCONTINUED] fludrocortisone (FLORINEF) 0.1 MG tablet Take 1 tablet (0.1 mg total) by mouth daily with breakfast.   [DISCONTINUED] guaiFENesin (MUCINEX) 600 MG 12 hr tablet Take 1 tablet (600 mg total) by mouth 2 (two) times daily as needed. (Patient not taking: Reported on 09/27/2021)   [DISCONTINUED] levothyroxine (SYNTHROID) 88 MCG tablet Take 1 tablet (88 mcg total) by mouth daily before breakfast.    [DISCONTINUED] magnesium oxide (MAG-OX) 400 (240 Mg) MG tablet Take 1 tablet (400 mg total) by mouth 2 (two) times daily. (Patient not taking: Reported on 09/27/2021)   [DISCONTINUED] oxyCODONE (OXY IR/ROXICODONE) 5 MG immediate release tablet Take 1 tablet (5 mg total) by mouth every 6 (six) hours as needed for severe pain or breakthrough pain. (Patient not taking: Reported on 09/27/2021)   [  DISCONTINUED] potassium chloride (KLOR-CON) 10 MEQ tablet Take 2 tablets (20 mEq total) by mouth daily.   [DISCONTINUED] prochlorperazine (COMPAZINE) 10 MG tablet Take 1 tablet (10 mg total) by mouth every 6 (six) hours as needed (Nausea or vomiting). (Patient not taking: Reported on 09/27/2021)   No facility-administered encounter medications on file as of 10/04/2021.   ALLERGIES: Allergies  Allergen Reactions   Adhesive [Tape]     Contact dermatitis   Sulfa Antibiotics     UNKNOWN Childhood reaction    VACCINATION STATUS: Immunization History  Administered Date(s) Administered   Influenza,inj,Quad PF,6+ Mos 04/28/2015   Moderna Sars-Covid-2 Vaccination 07/03/2019, 07/31/2019   PFIZER(Purple Top)SARS-COV-2 Vaccination 03/25/2020   Pfizer Covid-19 Vaccine Bivalent Booster 64yr & up 08/24/2021   Pneumococcal Polysaccharide-23 04/28/2015    HPI DDELNO BLAISDELLis 71y.o. male who presents today with a medical history as above. he is being seen in follow-up after he was seen in consultation for adrenal insufficiency requested by HBridget Hartshorn NP.  He also has hypothyroidism on levothyroxine.  His recent repeat ACTH stimulation test confirmed primary adrenal insufficiency.    He is currently on prednisone 10 mg p.o. daily, midodrine 5 mg p.o. 3 times daily for blood pressure stabilization, as well as Florinef 0.1 mg p.o. daily at breakfast.   He denies any history of head injury, visual field deficits, no headaches.  He denies any prior history of abdominal surgery, injury, nor infections.   He  reports steady weight after he lost 15 pounds in the last 2 months unintentionally.    He denies any exposure to heavy dose steroids in the past.  His other medical problems include hyperlipidemia on treatment with Crestor.  He has paroxysmal atrial fibrillation, left anterior fascicular block, currently on anticoagulation using Coumadin 5 mg every evening.    Review of Systems  Constitutional: + Recent weight loss, + fatigue, no subjective hyperthermia, no subjective hypothermia  Objective:       10/04/2021    9:53 AM 09/27/2021    9:30 PM 09/27/2021    8:30 PM  Vitals with BMI  Height '6\' 0"'$     Weight 243 lbs 3 oz    BMI 319.50   Systolic [id number] Diastolic 84 61 64  Pulse 52 52 52    BP 122/84   Pulse (!) 52   Ht 6' (1.829 m)   Wt 243 lb 3.2 oz (110.3 kg)   BMI 32.98 kg/m   Wt Readings from Last 3 Encounters:  10/04/21 243 lb 3.2 oz (110.3 kg)  09/27/21 237 lb 10.5 oz (107.8 kg)  09/27/21 237 lb 10.5 oz (107.8 kg)    Physical Exam  Constitutional:  Body mass index is 32.98 kg/m.,  not in acute distress, normal state of mind, +  walks with a cane   CMP ( most recent) CMP     Component Value Date/Time   NA 137 09/27/2021 1220   NA 140 05/30/2021 0944   K 3.0 (L) 09/27/2021 1220   CL 98 09/27/2021 1220   CO2 29 09/27/2021 1220   GLUCOSE 104 (H) 09/27/2021 1220   BUN 21 09/27/2021 1220   BUN 16 05/30/2021 0944   CREATININE 0.94 09/27/2021 1220   CREATININE 0.82 11/11/2020 1046   CALCIUM 9.4 09/27/2021 1220   PROT 6.9 09/27/2021 1220   PROT 6.2 05/30/2021 0944   ALBUMIN 3.7 09/27/2021 1220   ALBUMIN 4.1 05/30/2021 0944   AST 47 (H) 09/27/2021 1220  AST 74 (H) 11/11/2020 1046   ALT 38 09/27/2021 1220   ALT 21 11/11/2020 1046   ALKPHOS 112 09/27/2021 1220   BILITOT 2.1 (H) 09/27/2021 1220   BILITOT 0.6 05/30/2021 0944   BILITOT 2.3 (H) 11/11/2020 1046   GFRNONAA >60 09/27/2021 1220   GFRNONAA >60 11/11/2020 1046   GFRAA >60 04/29/2019 1033      Diabetic Labs (most recent): Lab Results  Component Value Date   HGBA1C 5.1 01/08/2021     Lipid Panel ( most recent) Lipid Panel     Component Value Date/Time   CHOL 147 04/29/2015 0426   TRIG 153 (H) 04/29/2015 0426   HDL 19 (L) 04/29/2015 0426   CHOLHDL 7.7 04/29/2015 0426   VLDL 31 04/29/2015 0426   LDLCALC 97 04/29/2015 0426      Lab Results  Component Value Date   TSH 10.200 (H) 10/03/2021   TSH 8.100 (H) 05/30/2021   TSH 0.770 02/23/2021   TSH 0.791 02/21/2021   TSH 0.036 (L) 01/08/2021   TSH 0.067 (L) 11/09/2020   TSH 0.030 (L) 10/21/2020   TSH 3.836 03/17/2018   TSH 6.505 (H) 04/27/2015   TSH 10.503 (H) 04/27/2015   FREET4 1.13 10/03/2021   FREET4 0.75 (L) 05/30/2021   FREET4 0.31 (L) 02/21/2021   FREET4 0.74 01/09/2021   FREET4 1.12 11/09/2020   FREET4 1.75 (H) 10/21/2020   FREET4 0.98 03/17/2018   FREET4 0.93 04/28/2015       Assessment & Plan:   1. Adrenal insufficiency ([place]) 2.  Hypothyroidism  I discussed with him the need for steroids replacement.  He stands to benefit from steroid replacement.  He is advised to continue prednisone 10 mg p.o. daily at breakfast for glucocorticoid requirements.    He is advised to be consistent on his fludrocortisone 0.1 mg p.o. daily at breakfast for his mineralocorticoid requirements.  He has a chance to utilize midodrine less frequently if he is consistent with his Florinef.    For now he is also advised to continue midodrine 5 mg p.o. 3 times daily .  We discussed about sick day rules on prednisone.  -I have emphasized the absolute need for steroid replacement on a daily basis for long-term.  Regarding his hypothyroidism: His previsit labs are consistent with under replacement.  I discussed and increased his levothyroxine to 100 mcg p.o. daily before breakfast.    - We discussed about the correct intake of his thyroid hormone, on empty stomach at fasting, with water, separated by at least 30 minutes  from breakfast and other medications,  and separated by more than 4 hours from calcium, iron, multivitamins, acid reflux medications (PPIs). -Patient is made aware of the fact that thyroid hormone replacement is needed for life, dose to be adjusted by periodic monitoring of thyroid function tests.  He has normal sodium, low potassium at 3.0.  He is advised to increase his potassium to 20 mg twice daily.  He is advised on water restriction, advised to drink 2 L of water daily down from 4 L daily.  He is advised to allow more Gatorade/electrolyte liquids.  His previous hyponatremia has resolved, likely SIADH.    His presenting hyponatremia may be multifactorial including SIADH.  -He is advised on fall precaution, encouraged to use his cane for equilibrium.     - he is advised to maintain close follow up with [place], Karie Schwalbe, NP for primary care needs.    I spent 30 minutes in the care  of the patient today including review of labs from Thyroid Function, CMP, and other relevant labs ; imaging/biopsy records (current and previous including abstractions from other facilities); face-to-face time discussing  his lab results and symptoms, medications doses, his options of short and long term treatment based on the latest standards of care / guidelines;   and documenting the encounter.  Roy [name]  participated in the discussions, expressed understanding, and voiced agreement with the above plans.  All questions were answered to his satisfaction. he is encouraged to contact clinic should he have any questions or concerns prior to his return visit.    Follow up plan: Return in about 6 months (around 04/05/2022) for F/U with Pre-visit Labs.   Glade Lloyd, MD [facility] [address] [place], [place] [address] Phone: [phone]  Fax: [phone]     10/04/2021, 12:30 PM  This note was partially dictated with voice recognition  software. Similar sounding words can be transcribed inadequately or may not  be corrected upon review.  Dexamethasone

## 2021-10-05 NOTE — Congregational Nurse Program (Signed)
F/u with patient as a result of food security and delivered  senior meals to home.  

## 2021-10-14 ENCOUNTER — Other Ambulatory Visit: Payer: Self-pay | Admitting: Nurse Practitioner

## 2021-10-17 ENCOUNTER — Telehealth: Payer: Self-pay | Admitting: "Endocrinology

## 2021-10-17 DIAGNOSIS — E2740 Unspecified adrenocortical insufficiency: Secondary | ICD-10-CM

## 2021-10-17 MED ORDER — PREDNISONE 10 MG PO TABS: 10.0000 mg | ORAL_TABLET | Freq: Every day | ORAL | 1 refills | Status: DC

## 2021-10-17 NOTE — Telephone Encounter (Signed)
Roy [name] called and said the patient will be using them now and he needs a new RX for his Prednisone '10mg'$ , 90 tablets. Thank you

## 2021-10-17 NOTE — Telephone Encounter (Signed)
Rx sent 

## 2021-10-19 ENCOUNTER — Ambulatory Visit (INDEPENDENT_AMBULATORY_CARE_PROVIDER_SITE_OTHER): Payer: Medicare Other | Admitting: Urology

## 2021-10-19 ENCOUNTER — Encounter: Payer: Self-pay | Admitting: Urology

## 2021-10-19 VITALS — BP 137/70 | HR 80

## 2021-10-19 DIAGNOSIS — N400 Enlarged prostate without lower urinary tract symptoms: Secondary | ICD-10-CM

## 2021-10-19 DIAGNOSIS — N3941 Urge incontinence: Secondary | ICD-10-CM | POA: Diagnosis not present

## 2021-10-19 DIAGNOSIS — N401 Enlarged prostate with lower urinary tract symptoms: Secondary | ICD-10-CM

## 2021-10-19 LAB — URINALYSIS, ROUTINE W REFLEX MICROSCOPIC
Bilirubin, UA: NEGATIVE
Glucose, UA: NEGATIVE
Ketones, UA: NEGATIVE
Leukocytes,UA: NEGATIVE
Nitrite, UA: NEGATIVE
Protein,UA: NEGATIVE
RBC, UA: NEGATIVE
Specific Gravity, UA: 1.01 (ref 1.005–1.030)
Urobilinogen, Ur: 1 mg/dL (ref 0.2–1.0)
pH, UA: 6 (ref 5.0–7.5)

## 2021-10-19 LAB — BLADDER SCAN AMB NON-IMAGING: Scan Result: 35

## 2021-10-19 MED ORDER — TROSPIUM CHLORIDE 20 MG PO TABS: 20.0000 mg | ORAL_TABLET | Freq: Two times a day (BID) | ORAL | 5 refills | Status: DC

## 2021-10-19 NOTE — Progress Notes (Signed)
post void residual=35 

## 2021-10-19 NOTE — Progress Notes (Signed)
Subjective: 1. Benign prostatic hyperplasia, unspecified whether lower urinary tract symptoms present   2. Urge incontinence      Consult requested by Lars Mage NP.    Roy [name] is a 71 yo male who is sent for BPH with BOO and UUI.  He had a bout of food poisoning about 3 weeks ago with N/V and diarrhea but the UUI has been going on for 3-4 months.  He will leak every time he makes a step.  He is not wearing pads.  He has no hematuria or dysuria.  He has nocturia x 3 with UUI.  He has daytime frequency 10x with UUI as well.  He has no SUI.  He has a reduced stream and doesn't feel empty.  He had a CT AP on 09/27/21 and had no GU abnormalities. His bladder was not distended and his prostate wasn't too large.   He has had no UTI's or Gu surgery.  His UA is clear today.  He is on tamsulosin but that hasn't helped.  He had labs on 09/27/21 and had normal renal function.  His Bili was 2.1 with an ALT of 47 and some changes suggestive of cirrhosis on the CT.   He had a UA with many bacteria in 3/23 with Mx species on the culture but a negative UA on 09/27/21.  I don't see a PSA in his labs.  ROS:  ROS  Allergies  Allergen Reactions   Adhesive [Tape]     Contact dermatitis   Sulfa Antibiotics     UNKNOWN Childhood reaction    Past Medical History:  Diagnosis Date   Atrial fibrillation (HCC)    Cancer (HCC)    Diffuse Large B-Cell lymphoma   GERD (gastroesophageal reflux disease)    High cholesterol    Hypertension    Obesity     Past Surgical History:  Procedure Laterality Date   APPENDECTOMY     CATARACT EXTRACTION Bilateral 2022   CHOLECYSTECTOMY     ESOPHAGEAL DILATION     multiple times   IR IMAGING GUIDED PORT INSERTION  03/31/2021   JOINT REPLACEMENT Left    hip    Social History   Socioeconomic History   Marital status: Widowed    Spouse name: Not on file   Number of children: 3   Years of education: Not on file   Highest education level: Not on file   Occupational History   Occupation: Retired  Tobacco Use   Smoking status: Never    Passive exposure: Never   Smokeless tobacco: Never  Vaping Use   Vaping Use: Never used  Substance and Sexual Activity   Alcohol use: No   Drug use: No   Sexual activity: Not Currently  Other Topics Concern   Not on file  Social History Narrative   Not on file   Social Determinants of Health   Financial Resource Strain: Low Risk  (11/08/2020)   Overall Financial Resource Strain (CARDIA)    Difficulty of Paying Living Expenses: Not hard at all  Food Insecurity: No Food Insecurity (11/08/2020)   Hunger Vital Sign    Worried About Running Out of Food in the Last Year: Never true    [place] in the Last Year: Never true  Transportation Needs: No Transportation Needs (11/08/2020)   PRAPARE - Hydrologist (Medical): No    Lack of Transportation (Non-Medical): No  Physical Activity: Sufficiently Active (11/08/2020)   Exercise Vital  Sign    Days of Exercise per Week: 5 days    Minutes of Exercise per Session: 30 min  Stress: No Stress Concern Present (11/08/2020)   [employer]    Feeling of Stress : Not at all  Social Connections: Moderately Isolated (11/08/2020)   Social Connection and Isolation Panel [NHANES]    Frequency of Communication with Friends and Family: More than three times a week    Frequency of Social Gatherings with Friends and Family: Three times a week    Attends Religious Services: 1 to 4 times per year    Active Member of Clubs or Organizations: No    Attends Archivist Meetings: Never    Marital Status: Widowed  Intimate Partner Violence: Not At Risk (11/08/2020)   Humiliation, Afraid, Rape, and Kick questionnaire    Fear of Current or Ex-Partner: No    Emotionally Abused: No    Physically Abused: No    Sexually Abused: No    Family History  Problem Relation Age of Onset    Heart failure Father    Heart attack Father        Deceased    Thyroid disease Sister     Anti-infectives: Anti-infectives (From admission, onward)    None       Current Outpatient Medications  Medication Sig Dispense Refill   acetaminophen (TYLENOL) 325 MG tablet Take 2 tablets (650 mg total) by mouth every 6 (six) hours as needed for headache. 30 tablet 1   Calcium Carbonate-Vitamin D (CALCIUM-VITAMIN D) 600-125 MG-UNIT TABS Take by mouth.     Cholecalciferol (VITAMIN D3) 25 MCG (1000 UT) CAPS Take by mouth daily.     ELIQUIS 5 MG TABS tablet Take 5 mg by mouth 2 (two) times daily.     EQUATE STOOL SOFTENER 100 MG capsule Take 2 capsules by mouth twice daily 120 capsule 0   FEROSUL 325 (65 Fe) MG tablet Take 1 tablet by mouth once daily with breakfast 30 tablet 0   fludrocortisone (FLORINEF) 0.1 MG tablet Take 1 tablet (0.1 mg total) by mouth daily with breakfast. 90 tablet 1   gabapentin (NEURONTIN) 300 MG capsule Take 1 capsule (300 mg total) by mouth 2 (two) times daily. 60 capsule 6   levothyroxine (SYNTHROID) 100 MCG tablet Take 1 tablet (100 mcg total) by mouth daily before breakfast. 90 tablet 1   magnesium oxide (MAG-OX) 400 (240 Mg) MG tablet Take 400 mg by mouth daily.     midodrine (PROAMATINE) 5 MG tablet Take 1 tablet (5 mg total) by mouth 3 (three) times daily with meals. 360 tablet 0   omeprazole (PRILOSEC) 20 MG capsule Take 1 capsule (20 mg total) by mouth daily. 30 capsule 0   potassium chloride (KLOR-CON) 10 MEQ tablet Take 2 tablets (20 mEq total) by mouth 2 (two) times daily. 180 tablet 1   prednisoLONE acetate (PRED FORTE) 1 % ophthalmic suspension Place into the left eye.     predniSONE (DELTASONE) 10 MG tablet Take 1 tablet (10 mg total) by mouth daily with breakfast. 90 tablet 1   rosuvastatin (CRESTOR) 5 MG tablet Take 1 tablet by mouth at bedtime.     trospium (SANCTURA) 20 MG tablet Take 1 tablet (20 mg total) by mouth 2 (two) times daily. 60 tablet 5    vitamin B-12 (CYANOCOBALAMIN) 1000 MCG tablet Take 1 tablet (1,000 mcg total) by mouth daily. 30 tablet 3   No current  facility-administered medications for this visit.     Objective: Vital signs in last 24 hours: BP 137/70   Pulse 80   Intake/Output from previous day: No intake/output data recorded. Intake/Output this shift: '@IOTHISSHIFT'$ @   Physical Exam Vitals reviewed.  Constitutional:      Appearance: Normal appearance.  Cardiovascular:     Rate and Rhythm: Normal rate and regular rhythm.     Heart sounds: Normal heart sounds.  Pulmonary:     Effort: Pulmonary effort is normal. No respiratory distress.     Breath sounds: Normal breath sounds.  Abdominal:     General: Abdomen is flat.     Palpations: Abdomen is soft. There is no mass.     Tenderness: There is no abdominal tenderness.     Hernia: No hernia is present.  Genitourinary:    Comments: Nl uncirc phallus with adequate meatus. Scrotum, testes and epididymis normal. AP without lesions. NST without mass. Prostate 1.5+ benign. SV's normal. Musculoskeletal:        General: No swelling or tenderness. Normal range of motion.  Skin:    General: Skin is warm and dry.  Neurological:     General: No focal deficit present.     Mental Status: He is alert.  Psychiatric:        Mood and Affect: Mood normal.        Behavior: Behavior normal.     Lab Results:  Results for orders placed or performed in visit on 10/19/21 (from the past 24 hour(s))  Urinalysis, Routine w reflex microscopic     Status: None   Collection Time: 10/19/21 11:35 AM  Result Value Ref Range   Specific Gravity, UA 1.010 1.005 - 1.030   pH, UA 6.0 5.0 - 7.5   Color, UA Yellow Yellow   Appearance Ur Clear Clear   Leukocytes,UA Negative Negative   Protein,UA Negative Negative/Trace   Glucose, UA Negative Negative   Ketones, UA Negative Negative   RBC, UA Negative Negative   Bilirubin, UA Negative Negative   Urobilinogen, Ur 1.0 0.2 -  1.0 mg/dL   Nitrite, UA Negative Negative   Narrative   Performed at:  [facility] [address], [place], [place]  [id number] Lab Director: Mina Marble MT, Phone:  [phone]    Recent Results (from the past 2160 hour(s))  CBC with Differential     Status: Abnormal   Collection Time: 07/26/21  7:45 AM  Result Value Ref Range   WBC 7.0 4.0 - 10.5 K/uL   RBC 4.13 (L) 4.22 - 5.81 MIL/uL   Hemoglobin 12.1 (L) 13.0 - 17.0 g/dL   HCT 38.5 (L) 39.0 - 52.0 %   MCV 93.2 80.0 - 100.0 fL   MCH 29.3 26.0 - 34.0 pg   MCHC 31.4 30.0 - 36.0 g/dL   RDW 15.6 (H) 11.5 - 15.5 %   Platelets 347 150 - 400 K/uL   nRBC 0.0 0.0 - 0.2 %   Neutrophils Relative % 84 %   Neutro Abs 5.8 1.7 - 7.7 K/uL   Lymphocytes Relative 11 %   Lymphs Abs 0.8 0.7 - 4.0 K/uL   Monocytes Relative 3 %   Monocytes Absolute 0.2 0.1 - 1.0 K/uL   Eosinophils Relative 0 %   Eosinophils Absolute 0.0 0.0 - 0.5 K/uL   Basophils Relative 1 %   Basophils Absolute 0.1 0.0 - 0.1 K/uL   Immature Granulocytes 1 %   Abs Immature Granulocytes 0.08 (H) 0.00 -  0.07 K/uL    Comment: Performed at [facility], [address], [place], [place] [address]  Comprehensive metabolic panel     Status: Abnormal   Collection Time: 07/26/21  7:45 AM  Result Value Ref Range   Sodium 137 135 - 145 mmol/L   Potassium 3.8 3.5 - 5.1 mmol/L   Chloride 98 98 - 111 mmol/L   CO2 28 22 - 32 mmol/L   Glucose, Bld 113 (H) 70 - 99 mg/dL    Comment: Glucose reference range applies only to samples taken after fasting for at least 8 hours.   BUN 12 8 - 23 mg/dL   Creatinine, Ser 0.77 0.61 - 1.24 mg/dL   Calcium 9.2 8.9 - 10.3 mg/dL   Total Protein 6.6 6.5 - 8.1 g/dL   Albumin 3.8 3.5 - 5.0 g/dL   AST 24 15 - 41 U/L   ALT 14 0 - 44 U/L   Alkaline Phosphatase 69 38 - 126 U/L   Total Bilirubin 0.5 0.3 - 1.2 mg/dL   GFR, Estimated >60 >60 mL/min    Comment: (NOTE) Calculated using the CKD-EPI Creatinine Equation (2021)    Anion gap 11 5  - 15    Comment: Performed at [facility], [address], [place], [place] [address]  Magnesium     Status: None   Collection Time: 07/26/21  7:45 AM  Result Value Ref Range   Magnesium 2.1 1.7 - 2.4 mg/dL    Comment: Performed at [facility], [address], [place], [place] [address]  Lactate dehydrogenase     Status: None   Collection Time: 07/26/21  7:45 AM  Result Value Ref Range   LDH 177 98 - 192 U/L    Comment: Performed at [facility], [address], [place], [place] [address]  Uric acid     Status: None   Collection Time: 07/26/21  7:45 AM  Result Value Ref Range   Uric Acid, Serum 5.0 3.7 - 8.6 mg/dL    Comment: Performed at [facility], [address], [place], [place] [address]  CBC with Differential     Status: None   Collection Time: 08/14/21  8:00 AM  Result Value Ref Range   WBC 9.8 4.0 - 10.5 K/uL   RBC 4.57 4.22 - 5.81 MIL/uL   Hemoglobin 13.4 13.0 - 17.0 g/dL   HCT 42.3 39.0 - 52.0 %   MCV 92.6 80.0 - 100.0 fL   MCH 29.3 26.0 - 34.0 pg   MCHC 31.7 30.0 - 36.0 g/dL   RDW 14.5 11.5 - 15.5 %   Platelets 181 150 - 400 K/uL   nRBC 0.0 0.0 - 0.2 %   Neutrophils Relative % 74 %   Neutro Abs 7.4 1.7 - 7.7 K/uL   Lymphocytes Relative 16 %   Lymphs Abs 1.5 0.7 - 4.0 K/uL   Monocytes Relative 4 %   Monocytes Absolute 0.4 0.1 - 1.0 K/uL   Eosinophils Relative 4 %   Eosinophils Absolute 0.4 0.0 - 0.5 K/uL   Basophils Relative 1 %   Basophils Absolute 0.1 0.0 - 0.1 K/uL   Immature Granulocytes 1 %   Abs Immature Granulocytes 0.05 0.00 - 0.07 K/uL    Comment: Performed at [facility], [address], [place], [place] [address]  Comprehensive metabolic panel     Status: Abnormal   Collection Time: 08/14/21  8:00 AM  Result Value Ref Range   Sodium 138 135 - 145 mmol/L  Potassium 2.9 (L) 3.5 - 5.1 mmol/L   Chloride 99 98 - 111 mmol/L   CO2 31 22 - 32 mmol/L   Glucose, Bld 165 (H) 70 - 99 mg/dL    Comment: Glucose reference range applies only to samples taken  after fasting for at least 8 hours.   BUN 14 8 - 23 mg/dL   Creatinine, Ser 0.69 0.61 - 1.24 mg/dL   Calcium 9.2 8.9 - 10.3 mg/dL   Total Protein 6.4 (L) 6.5 - 8.1 g/dL   Albumin 3.9 3.5 - 5.0 g/dL   AST 25 15 - 41 U/L   ALT 20 0 - 44 U/L   Alkaline Phosphatase 65 38 - 126 U/L   Total Bilirubin 0.9 0.3 - 1.2 mg/dL   GFR, Estimated >60 >60 mL/min    Comment: (NOTE) Calculated using the CKD-EPI Creatinine Equation (2021)    Anion gap 8 5 - 15    Comment: Performed at [facility], [address], [place], [place] [address]  Magnesium     Status: None   Collection Time: 08/14/21  8:00 AM  Result Value Ref Range   Magnesium 1.9 1.7 - 2.4 mg/dL    Comment: Performed at [facility], [address], [place], [place] [address]  Lactate dehydrogenase     Status: None   Collection Time: 08/14/21  8:00 AM  Result Value Ref Range   LDH 134 98 - 192 U/L    Comment: Performed at [facility], [address] [place], [place] [address]  Uric acid     Status: None   Collection Time: 08/14/21  8:00 AM  Result Value Ref Range   Uric Acid, Serum 6.1 3.7 - 8.6 mg/dL    Comment: Performed at [facility], [address] [place], [place] [address]  Phosphorus     Status: None   Collection Time: 08/14/21  8:00 AM  Result Value Ref Range   Phosphorus 3.0 2.5 - 4.6 mg/dL    Comment: Performed at [facility], [address], [place], [place] [address]  CBC with Differential     Status: Abnormal   Collection Time: 09/21/21  8:02 AM  Result Value Ref Range   WBC 9.4 4.0 - 10.5 K/uL   RBC 5.37 4.22 - 5.81 MIL/uL   Hemoglobin 15.5 13.0 - 17.0 g/dL   HCT 48.6 39.0 - 52.0 %   MCV 90.5 80.0 - 100.0 fL   MCH 28.9 26.0 - 34.0 pg   MCHC 31.9 30.0 - 36.0 g/dL   RDW 15.0 11.5 - 15.5 %   Platelets 195 150 - 400 K/uL   nRBC 0.0 0.0 - 0.2 %   Neutrophils Relative % 58 %   Neutro Abs 5.5 1.7 - 7.7 K/uL   Lymphocytes Relative 29 %   Lymphs Abs 2.7 0.7 - 4.0 K/uL   Monocytes Relative 7 %   Monocytes Absolute  0.6 0.1 - 1.0 K/uL   Eosinophils Relative 3 %   Eosinophils Absolute 0.3 0.0 - 0.5 K/uL   Basophils Relative 1 %   Basophils Absolute 0.1 0.0 - 0.1 K/uL   Immature Granulocytes 2 %   Abs Immature Granulocytes 0.21 (H) 0.00 - 0.07 K/uL    Comment: Performed at [facility], [address], [place], [place] [address]  Comprehensive metabolic panel     Status: None   Collection Time: 09/21/21  8:02 AM  Result Value Ref Range   Sodium 141 135 - 145 mmol/L   Potassium 3.5  3.5 - 5.1 mmol/L   Chloride 100 98 - 111 mmol/L   CO2 32 22 - 32 mmol/L   Glucose, Bld 94 70 - 99 mg/dL    Comment: Glucose reference range applies only to samples taken after fasting for at least 8 hours.   BUN 13 8 - 23 mg/dL   Creatinine, Ser 0.74 0.61 - 1.24 mg/dL   Calcium 9.7 8.9 - 10.3 mg/dL   Total Protein 7.2 6.5 - 8.1 g/dL   Albumin 4.3 3.5 - 5.0 g/dL   AST 33 15 - 41 U/L   ALT 27 0 - 44 U/L   Alkaline Phosphatase 71 38 - 126 U/L   Total Bilirubin 1.1 0.3 - 1.2 mg/dL   GFR, Estimated >60 >60 mL/min    Comment: (NOTE) Calculated using the CKD-EPI Creatinine Equation (2021)    Anion gap 9 5 - 15    Comment: Performed at [facility], [address], [place], [place] [address]  Magnesium     Status: None   Collection Time: 09/21/21  8:02 AM  Result Value Ref Range   Magnesium 2.3 1.7 - 2.4 mg/dL    Comment: Performed at [facility], [address] [place], [place] [address]  Lactate dehydrogenase     Status: Abnormal   Collection Time: 09/21/21  8:02 AM  Result Value Ref Range   LDH 238 (H) 98 - 192 U/L    Comment: Performed at [facility], [address], [place], [place] [address]  Uric acid     Status: None   Collection Time: 09/21/21  8:02 AM  Result Value Ref Range   Uric Acid, Serum 6.3 3.7 - 8.6 mg/dL    Comment: Performed at [facility], [address] [place], [place] [address]  Phosphorus     Status: None   Collection Time: 09/21/21  8:02 AM  Result Value Ref Range   Phosphorus 4.1 2.5 -  4.6 mg/dL    Comment: Performed at [facility], [address], [place], [place] [address]  CBC with Differential     Status: Abnormal   Collection Time: 09/27/21 12:20 PM  Result Value Ref Range   WBC 12.7 (H) 4.0 - 10.5 K/uL   RBC 5.38 4.22 - 5.81 MIL/uL   Hemoglobin 15.5 13.0 - 17.0 g/dL   HCT 46.9 39.0 - 52.0 %   MCV 87.2 80.0 - 100.0 fL   MCH 28.8 26.0 - 34.0 pg   MCHC 33.0 30.0 - 36.0 g/dL   RDW 14.9 11.5 - 15.5 %   Platelets 208 150 - 400 K/uL   nRBC 0.0 0.0 - 0.2 %   Neutrophils Relative % 60 %   Neutro Abs 8.0 (H) 1.7 - 7.7 K/uL   Band Neutrophils 3 %   Lymphocytes Relative 27 %   Lymphs Abs 3.4 0.7 - 4.0 K/uL   Monocytes Relative 7 %   Monocytes Absolute 0.9 0.1 - 1.0 K/uL   Eosinophils Relative 2 %   Eosinophils Absolute 0.3 0.0 - 0.5 K/uL   Basophils Relative 1 %   Basophils Absolute 0.1 0.0 - 0.1 K/uL   RBC Morphology MORPHOLOGY UNREMARKABLE    Smear Review MORPHOLOGY UNREMARKABLE    Hypersegmented Neutrophils PRESENT     Comment: Performed at [facility], [address], [place],  [address]  Comprehensive metabolic panel     Status: Abnormal   Collection Time: 09/27/21 12:20 PM  Result Value Ref Range   Sodium 137 135 - 145 mmol/L  Potassium 3.0 (L) 3.5 - 5.1 mmol/L   Chloride 98 98 - 111 mmol/L   CO2 29 22 - 32 mmol/L   Glucose, Bld 104 (H) 70 - 99 mg/dL    Comment: Glucose reference range applies only to samples taken after fasting for at least 8 hours.   BUN 21 8 - 23 mg/dL   Creatinine, Ser 0.94 0.61 - 1.24 mg/dL   Calcium 9.4 8.9 - 10.3 mg/dL   Total Protein 6.9 6.5 - 8.1 g/dL   Albumin 3.7 3.5 - 5.0 g/dL   AST 47 (H) 15 - 41 U/L   ALT 38 0 - 44 U/L   Alkaline Phosphatase 112 38 - 126 U/L   Total Bilirubin 2.1 (H) 0.3 - 1.2 mg/dL   GFR, Estimated >60 >60 mL/min    Comment: (NOTE) Calculated using the CKD-EPI Creatinine Equation (2021)    Anion gap 10 5 - 15    Comment: Performed at [facility], [address], [place], [place] [address]   Lipase, blood     Status: None   Collection Time: 09/27/21 12:20 PM  Result Value Ref Range   Lipase 20 11 - 51 U/L    Comment: Performed at [facility], [address], [place], [place] [address]  Urinalysis, Routine w reflex microscopic Urine, Clean Catch     Status: Abnormal   Collection Time: 09/27/21 12:49 PM  Result Value Ref Range   Color, Urine YELLOW YELLOW   APPearance CLEAR CLEAR   Specific Gravity, Urine 1.045 (H) 1.005 - 1.030   pH 5.0 5.0 - 8.0   Glucose, UA NEGATIVE NEGATIVE mg/dL   Hgb urine dipstick NEGATIVE NEGATIVE   Bilirubin Urine NEGATIVE NEGATIVE   Ketones, ur NEGATIVE NEGATIVE mg/dL   Protein, ur NEGATIVE NEGATIVE mg/dL   Nitrite NEGATIVE NEGATIVE   Leukocytes,Ua NEGATIVE NEGATIVE    Comment: Performed at [facility], [address], [place], [place] [address]  POC occult blood, ED Provider will collect     Status: None   Collection Time: 09/27/21 12:51 PM  Result Value Ref Range   Occult Blood, Stool #1 negative   TSH     Status: Abnormal   Collection Time: 10/03/21  8:54 AM  Result Value Ref Range   TSH 10.200 (H) 0.450 - 4.500 uIU/mL  T4, free     Status: None   Collection Time: 10/03/21  8:54 AM  Result Value Ref Range   Free T4 1.13 0.82 - 1.77 ng/dL  Bladder Scan (Post Void Residual) in office     Status: None   Collection Time: 10/19/21 11:25 AM  Result Value Ref Range   Scan Result 35   Urinalysis, Routine w reflex microscopic     Status: None   Collection Time: 10/19/21 11:35 AM  Result Value Ref Range   Specific Gravity, UA 1.010 1.005 - 1.030   pH, UA 6.0 5.0 - 7.5   Color, UA Yellow Yellow   Appearance Ur Clear Clear   Leukocytes,UA Negative Negative   Protein,UA Negative Negative/Trace   Glucose, UA Negative Negative   Ketones, UA Negative Negative   RBC, UA Negative Negative   Bilirubin, UA Negative Negative   Urobilinogen, Ur 1.0 0.2 - 1.0 mg/dL   Nitrite, UA Negative Negative    BMET No results for input(s): "NA", "K",  "CL", "CO2", "GLUCOSE", "BUN", "CREATININE", "CALCIUM" in the last 72 hours. PT/INR No results for input(s): "LABPROT", "INR" in the last 72 hours. ABG No results for input(s): "  PHART", "HCO3" in the last 72 hours.  Invalid input(s): "PCO2", "PO2"  Studies/Results: CT ABDOMEN PELVIS W CONTRAST  Result Date: 09/27/2021 CLINICAL DATA:  Left upper quadrant abdominal pain with vomiting. EXAM: CT ABDOMEN AND PELVIS WITH CONTRAST TECHNIQUE: Multidetector CT imaging of the abdomen and pelvis was performed using the standard protocol following bolus administration of intravenous contrast. RADIATION DOSE REDUCTION: This exam was performed according to the departmental dose-optimization program which includes automated exposure control, adjustment of the mA and/or kV according to patient size and/or use of iterative reconstruction technique. CONTRAST:  162m OMNIPAQUE IOHEXOL 300 MG/ML  SOLN COMPARISON:  Multiple priors including most recent PET-CT Sep 22, 2018 CT abdomen pelvis October 21, 2020 FINDINGS: Lower chest: No acute abnormality. Hepatobiliary: Cirrhotic hepatic morphology. No suspicious hepatic lesion. Gallbladder surgically absent. Similar mild prominence of the biliary tree favored reservoir effect post cholecystectomy. Pancreas: No pancreatic ductal dilation or evidence of acute inflammation. Spleen: Splenomegaly or focal splenic lesion. Adrenals/Urinary Tract: Bilateral adrenal glands appear normal. Hypodense left upper pole renal lesion is technically too small to accurately characterize but stable from prior and statistically likely to reflect a cyst which in the absence of clinically indicated signs/symptoms require no independent follow-up. No suspicious renal mass. Urinary bladder is unremarkable for degree of distension. Stomach/Bowel: No radiopaque enteric contrast material was administered. Stomach is unremarkable for degree of distension. No pathologic dilation of small or large bowel. No  evidence of acute bowel inflammation. Sigmoid colonic diverticulosis without findings of acute diverticulitis. Vascular/Lymphatic: Aortic and branch vessel atherosclerosis without abdominal aortic aneurysm. Reproductive: Prostatomegaly. Other: Similar paucity of right abdominal wall musculature. No significant abdominopelvic free fluid. Musculoskeletal: Ankylosis of the lower thoracic and lumbar spine with bilateral sacroiliitis, unchanged from prior. Left total hip arthroplasty. No acute osseous abnormality. IMPRESSION: 1. No acute abnormality within the abdomen or pelvis. 2. Sigmoid colonic diverticulosis without findings of acute diverticulitis. 3. Cirrhotic hepatic morphology with evidence of portal hypertension. 4. 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 5. Aortic Atherosclerosis (ICD10-I70.0). Electronically Signed   By: JDahlia BailiffM.D.   On: 09/27/2021 14:57   NM PET Image Restag (PS) Skull Base To Thigh  Result Date: 09/22/2021 CLINICAL DATA:  Subsequent treatment strategy for large B-cell lymphoma. EXAM: NUCLEAR MEDICINE PET SKULL BASE TO THIGH TECHNIQUE: 11.8 mCi F-18 FDG was injected intravenously. Full-ring PET imaging was performed from the skull base to thigh after the radiotracer. CT data was obtained and used for attenuation correction and anatomic localization. Fasting blood glucose: 79 mg/dl COMPARISON:  06/08/2021 and CT neck 03/28/2021. FINDINGS: Mediastinal blood pool activity: SUV max 2.5 Liver activity: SUV max 3.8 NECK: Hypermetabolic right parotid nodules measure up to 0.9 x 1.5 cm (3/73) and SUV max 11.9, stable. Medial left parotid nodule is stable, measuring 11 x 17 mm and SUV max 11.9 compared to 10.0 previously. No hypermetabolic lymph nodes. Incidental CT findings: None. CHEST: No abnormal hypermetabolism. Incidental CT findings: Right IJ Port-A-Cath terminates at the SVC RA junction. Atherosclerotic calcification of  the aorta, aortic valve and coronary arteries. Enlarged pulmonic trunk and heart. No pericardial or pleural effusion. ABDOMEN/PELVIS: No abnormal hypermetabolism in the liver, adrenal glands, spleen or pancreas. No hypermetabolic nodes. Incidental CT findings: Liver margin is slightly irregular with mild hypertrophy of the left hepatic lobe and caudate. Cholecystectomy. Adrenal glands, kidneys, spleen, pancreas, stomach and bowel are grossly unremarkable. Prostate is enlarged. SKELETON: No abnormal hypermetabolism. Incidental CT findings: Left hip arthroplasty.  Degenerative changes in the spine. IMPRESSION: 1. No evidence metabolically active lymphoma ([place] 1). 2. Hypermetabolic bilateral parotid nodules, stable and thought to represent possible Warthin's tumors on CT neck 03/28/2021. 3. Suspect cirrhosis. 4. Enlarged prostate. 5. Aortic atherosclerosis (ICD10-I70.0). Coronary artery calcification. 6. Enlarged pulmonic trunk, indicative of pulmonary arterial hypertension. Electronically Signed   By: Lorin Picket M.D.   On: 09/22/2021 14:10    PVR is 45m.   Assessment/Plan: BPH with UUI.   He is emptying well.  Neither Myrbetriq or GLogan Boresare on his formulary.  I will try trospium and reviewed the side effects.  I will have him return for cystoscopy in about 4-6 weeks.   Meds ordered this encounter  Medications   trospium (SANCTURA) 20 MG tablet    Sig: Take 1 tablet (20 mg total) by mouth 2 (two) times daily.    Dispense:  60 tablet    Refill:  5     Orders Placed This Encounter  Procedures   Urinalysis, Routine w reflex microscopic   Bladder Scan (Post Void Residual) in office     Return for 4-6 weeks for cystoscopy and PVR. .Marland Kitchen   CC: KLars MageNP.      JIrine Seal6/16/2023 [phone]

## 2021-10-25 ENCOUNTER — Telehealth: Payer: Self-pay

## 2021-10-25 NOTE — Telephone Encounter (Signed)
Patient left a voice message 10-25-21:  Medication prescribed is making pt dizzy and have blurry vision.  Please advise.  Call back:  [phone]  Thanks, Helene Kelp

## 2021-10-26 ENCOUNTER — Inpatient Hospital Stay (HOSPITAL_COMMUNITY): Payer: Medicare Other

## 2021-10-26 ENCOUNTER — Inpatient Hospital Stay (HOSPITAL_COMMUNITY): Payer: Medicare Other | Attending: Hematology | Admitting: Hematology

## 2021-10-26 VITALS — BP 159/85 | HR 60 | Temp 97.3°F | Resp 18 | Ht 72.0 in | Wt 245.2 lb

## 2021-10-26 DIAGNOSIS — Z7901 Long term (current) use of anticoagulants: Secondary | ICD-10-CM | POA: Insufficient documentation

## 2021-10-26 DIAGNOSIS — E2740 Unspecified adrenocortical insufficiency: Secondary | ICD-10-CM | POA: Insufficient documentation

## 2021-10-26 DIAGNOSIS — C8338 Diffuse large B-cell lymphoma, lymph nodes of multiple sites: Secondary | ICD-10-CM

## 2021-10-26 DIAGNOSIS — R109 Unspecified abdominal pain: Secondary | ICD-10-CM | POA: Diagnosis not present

## 2021-10-26 DIAGNOSIS — Z7952 Long term (current) use of systemic steroids: Secondary | ICD-10-CM | POA: Diagnosis not present

## 2021-10-26 DIAGNOSIS — I10 Essential (primary) hypertension: Secondary | ICD-10-CM | POA: Diagnosis not present

## 2021-10-26 DIAGNOSIS — I4891 Unspecified atrial fibrillation: Secondary | ICD-10-CM | POA: Diagnosis not present

## 2021-10-26 DIAGNOSIS — E876 Hypokalemia: Secondary | ICD-10-CM | POA: Insufficient documentation

## 2021-10-26 DIAGNOSIS — G629 Polyneuropathy, unspecified: Secondary | ICD-10-CM | POA: Diagnosis not present

## 2021-10-26 DIAGNOSIS — D110 Benign neoplasm of parotid gland: Secondary | ICD-10-CM | POA: Diagnosis not present

## 2021-10-26 DIAGNOSIS — Z79899 Other long term (current) drug therapy: Secondary | ICD-10-CM | POA: Diagnosis not present

## 2021-10-26 LAB — CBC WITH DIFFERENTIAL/PLATELET
Abs Immature Granulocytes: 0.09 10*3/uL — ABNORMAL HIGH (ref 0.00–0.07)
Basophils Absolute: 0 10*3/uL (ref 0.0–0.1)
Basophils Relative: 1 %
Eosinophils Absolute: 0 10*3/uL (ref 0.0–0.5)
Eosinophils Relative: 0 %
HCT: 45 % (ref 39.0–52.0)
Hemoglobin: 14.5 g/dL (ref 13.0–17.0)
Immature Granulocytes: 1 %
Lymphocytes Relative: 21 %
Lymphs Abs: 1.4 10*3/uL (ref 0.7–4.0)
MCH: 28.4 pg (ref 26.0–34.0)
MCHC: 32.2 g/dL (ref 30.0–36.0)
MCV: 88.1 fL (ref 80.0–100.0)
Monocytes Absolute: 0.2 10*3/uL (ref 0.1–1.0)
Monocytes Relative: 3 %
Neutro Abs: 5 10*3/uL (ref 1.7–7.7)
Neutrophils Relative %: 74 %
Platelets: 177 10*3/uL (ref 150–400)
RBC: 5.11 MIL/uL (ref 4.22–5.81)
RDW: 15.5 % (ref 11.5–15.5)
WBC: 6.8 10*3/uL (ref 4.0–10.5)
nRBC: 0 % (ref 0.0–0.2)

## 2021-10-26 LAB — COMPREHENSIVE METABOLIC PANEL
ALT: 18 U/L (ref 0–44)
AST: 26 U/L (ref 15–41)
Albumin: 4.1 g/dL (ref 3.5–5.0)
Alkaline Phosphatase: 67 U/L (ref 38–126)
Anion gap: 6 (ref 5–15)
BUN: 10 mg/dL (ref 8–23)
CO2: 34 mmol/L — ABNORMAL HIGH (ref 22–32)
Calcium: 9.8 mg/dL (ref 8.9–10.3)
Chloride: 102 mmol/L (ref 98–111)
Creatinine, Ser: 0.62 mg/dL (ref 0.61–1.24)
GFR, Estimated: >60 mL/min (ref >60)
Glucose, Bld: 122 mg/dL — ABNORMAL HIGH (ref 70–99)
Potassium: 4 mmol/L (ref 3.5–5.1)
Sodium: 142 mmol/L (ref 135–145)
Total Bilirubin: 0.8 mg/dL (ref 0.3–1.2)
Total Protein: 7.1 g/dL (ref 6.5–8.1)

## 2021-10-26 LAB — MAGNESIUM: Magnesium: 2.3 mg/dL (ref 1.7–2.4)

## 2021-10-26 LAB — LACTATE DEHYDROGENASE: LDH: 293 U/L — ABNORMAL HIGH (ref 98–192)

## 2021-10-26 NOTE — Patient Instructions (Addendum)
[place] at [facility] Discharge Instructions  You were seen and examined today by Dr. Delton Coombes.  Dr. Delton Coombes discussed your most recent lab work which is stable.   Your previous PET scan showed concerning masses on your salivary (parotid) glands. You will be referred to an ENT for biopsy of those masses.  Follow-up as scheduled.  Thank you for choosing [place] at [facility] to provide your oncology and hematology care.  To afford each patient quality time with our provider, please arrive at least 15 minutes before your scheduled appointment time.   If you have a lab appointment with the [facility] please come in thru the Main Entrance and check in at the main information desk.  You need to re-schedule your appointment should you arrive 10 or more minutes late.  We strive to give you quality time with our providers, and arriving late affects you and other patients whose appointments are after yours.  Also, if you no show three or more times for appointments you may be dismissed from the clinic at the providers discretion.     Again, thank you for choosing [facility].  Our hope is that these requests will decrease the amount of time that you wait before being seen by our physicians.       _____________________________________________________________  Should you have questions after your visit to [facility], please contact our office at [phone] and follow the prompts.  Our office hours are 8:00 a.m. and 4:30 p.m. Monday - Friday.  Please note that voicemails left after 4:00 p.m. may not be returned until the following business day.  We are closed weekends and major holidays.  You do have access to a nurse 24-7, just call the main number to the clinic [phone] and do not press any options, hold on the line and a nurse will answer the phone.    For prescription refill requests, have your pharmacy  contact our office and allow 72 hours.

## 2021-10-26 NOTE — Progress Notes (Signed)
[facility] [address], [place] [address]   CLINIC:  Medical Oncology/Hematology  PCP:  Rebecka Apley, NP [address] / [place] [address] [phone]   REASON FOR VISIT:  Follow-up for diffuse large B-cell lymphoma, stage IVb  PRIOR THERAPY: 6 cycles of R-CHOP completed on 08/14/2021  NGS Results: not done  CURRENT THERAPY: Surveillance  BRIEF ONCOLOGIC HISTORY:  Oncology History  Large B-cell lymphoma (HCC)  03/22/2021 Initial Diagnosis   DLBCL (diffuse large B cell lymphoma) (HCC)   03/22/2021 Cancer Staging   Staging form: Hodgkin and Non-Hodgkin Lymphoma, AJCC 8th Edition - Clinical stage from 03/22/2021: Stage IV (Diffuse large B-cell lymphoma) - Signed by Doreatha Massed, MD on 03/22/2021   04/12/2021 - 08/16/2021 Chemotherapy   Patient is on Treatment Plan : NON-HODGKINS LYMPHOMA R-CHOP q21d       CANCER STAGING: Cancer Staging  Large B-cell lymphoma (HCC) Staging form: Hodgkin and Non-Hodgkin Lymphoma, AJCC 8th Edition - Clinical stage from 03/22/2021: Stage IV (Diffuse large B-cell lymphoma) - Signed by Doreatha Massed, MD on 03/22/2021   INTERVAL HISTORY:  [name], a 71 y.o. male, returns for routine follow-up of his diffuse large B-cell lymphoma, stage IVb. Keigo was last seen on 09/27/2021.   Today he reports feeling good. He reports occasional abdominal pain which is improving. He takes potassium BID. He has gained 8 lbs since his last visit.   REVIEW OF SYSTEMS:  Review of Systems  Constitutional:  Positive for unexpected weight change (+8 lbs). Negative for appetite change and fatigue.  Gastrointestinal:  Positive for abdominal pain (improving).  Neurological:  Positive for dizziness and numbness.  All other systems reviewed and are negative.   PAST MEDICAL/SURGICAL HISTORY:  Past Medical History:  Diagnosis Date   Atrial fibrillation (HCC)    Cancer (HCC)    Diffuse  Large B-Cell lymphoma   GERD (gastroesophageal reflux disease)    High cholesterol    Hypertension    Obesity    Past Surgical History:  Procedure Laterality Date   APPENDECTOMY     CATARACT EXTRACTION Bilateral 2022   CHOLECYSTECTOMY     ESOPHAGEAL DILATION     multiple times   IR IMAGING GUIDED PORT INSERTION  03/31/2021   JOINT REPLACEMENT Left    hip    SOCIAL HISTORY:  Social History   Socioeconomic History   Marital status: Widowed    Spouse name: Not on file   Number of children: 3   Years of education: Not on file   Highest education level: Not on file  Occupational History   Occupation: Retired  Tobacco Use   Smoking status: Never    Passive exposure: Never   Smokeless tobacco: Never  Vaping Use   Vaping Use: Never used  Substance and Sexual Activity   Alcohol use: No   Drug use: No   Sexual activity: Not Currently  Other Topics Concern   Not on file  Social History Narrative   Not on file   Social Determinants of Health   Financial Resource Strain: Low Risk  (11/08/2020)   Overall Financial Resource Strain (CARDIA)    Difficulty of Paying Living Expenses: Not hard at all  Food Insecurity: No Food Insecurity (11/08/2020)   Hunger Vital Sign    Worried About Running Out of Food in the Last Year: Never true    Ran Out of Food in the Last Year: Never true  Transportation Needs: No  Transportation Needs (11/08/2020)   PRAPARE - Administrator, Civil Service (Medical): No    Lack of Transportation (Non-Medical): No  Physical Activity: Sufficiently Active (11/08/2020)   Exercise Vital Sign    Days of Exercise per Week: 5 days    Minutes of Exercise per Session: 30 min  Stress: No Stress Concern Present (11/08/2020)   [employer] of Occupational Health - Occupational Stress Questionnaire    Feeling of Stress : Not at all  Social Connections: Moderately Isolated (11/08/2020)   Social Connection and Isolation Panel [NHANES]    Frequency of  Communication with Friends and Family: More than three times a week    Frequency of Social Gatherings with Friends and Family: Three times a week    Attends Religious Services: 1 to 4 times per year    Active Member of Clubs or Organizations: No    Attends Banker Meetings: Never    Marital Status: Widowed  Intimate Partner Violence: Not At Risk (11/08/2020)   Humiliation, Afraid, Rape, and Kick questionnaire    Fear of Current or Ex-Partner: No    Emotionally Abused: No    Physically Abused: No    Sexually Abused: No    FAMILY HISTORY:  Family History  Problem Relation Age of Onset   Heart failure Father    Heart attack Father        Deceased    Thyroid disease Sister     CURRENT MEDICATIONS:  Current Outpatient Medications  Medication Sig Dispense Refill   acetaminophen (TYLENOL) 325 MG tablet Take 2 tablets (650 mg total) by mouth every 6 (six) hours as needed for headache. 30 tablet 1   Calcium Carbonate-Vitamin D (CALCIUM-VITAMIN D) 600-125 MG-UNIT TABS Take by mouth.     Cholecalciferol (VITAMIN D3) 25 MCG (1000 UT) CAPS Take by mouth daily.     ELIQUIS 5 MG TABS tablet Take 5 mg by mouth 2 (two) times daily.     EQUATE STOOL SOFTENER 100 MG capsule Take 2 capsules by mouth twice daily 120 capsule 0   FEROSUL 325 (65 Fe) MG tablet Take 1 tablet by mouth once daily with breakfast 30 tablet 0   fludrocortisone (FLORINEF) 0.1 MG tablet Take 1 tablet (0.1 mg total) by mouth daily with breakfast. 90 tablet 1   gabapentin (NEURONTIN) 300 MG capsule Take 1 capsule (300 mg total) by mouth 2 (two) times daily. 60 capsule 6   levothyroxine (SYNTHROID) 100 MCG tablet Take 1 tablet (100 mcg total) by mouth daily before breakfast. 90 tablet 1   magnesium oxide (MAG-OX) 400 (240 Mg) MG tablet Take 400 mg by mouth daily.     midodrine (PROAMATINE) 5 MG tablet Take 1 tablet (5 mg total) by mouth 3 (three) times daily with meals. 360 tablet 0   omeprazole (PRILOSEC) 20 MG capsule  Take 1 capsule (20 mg total) by mouth daily. 30 capsule 0   potassium chloride (KLOR-CON) 10 MEQ tablet Take 2 tablets (20 mEq total) by mouth 2 (two) times daily. 180 tablet 1   prednisoLONE acetate (PRED FORTE) 1 % ophthalmic suspension Place into the left eye.     predniSONE (DELTASONE) 10 MG tablet Take 1 tablet (10 mg total) by mouth daily with breakfast. 90 tablet 1   rosuvastatin (CRESTOR) 5 MG tablet Take 1 tablet by mouth at bedtime.     trospium (SANCTURA) 20 MG tablet Take 1 tablet (20 mg total) by mouth 2 (two) times daily. 60 tablet  5   vitamin B-12 (CYANOCOBALAMIN) 1000 MCG tablet Take 1 tablet (1,000 mcg total) by mouth daily. 30 tablet 3   No current facility-administered medications for this visit.    ALLERGIES:  Allergies  Allergen Reactions   Adhesive [Tape]     Contact dermatitis   Sulfa Antibiotics     UNKNOWN Childhood reaction    PHYSICAL EXAM:  Performance status (ECOG): 0 - Asymptomatic  There were no vitals filed for this visit. Wt Readings from Last 3 Encounters:  10/04/21 243 lb 3.2 oz (110.3 kg)  09/27/21 237 lb 10.5 oz (107.8 kg)  09/27/21 237 lb 10.5 oz (107.8 kg)   Physical Exam Vitals reviewed.  Constitutional:      Appearance: Normal appearance. He is obese.  Cardiovascular:     Rate and Rhythm: Normal rate and regular rhythm.     Pulses: Normal pulses.     Heart sounds: Normal heart sounds.  Pulmonary:     Effort: Pulmonary effort is normal.     Breath sounds: Normal breath sounds.  Abdominal:     Palpations: Abdomen is soft. There is no hepatomegaly, splenomegaly or mass.     Tenderness: There is no abdominal tenderness.  Musculoskeletal:     Right lower leg: No edema.     Left lower leg: No edema.  Lymphadenopathy:     Cervical: No cervical adenopathy.     Right cervical: No superficial, deep or posterior cervical adenopathy.    Left cervical: No superficial, deep or posterior cervical adenopathy.     Upper Body:     Right upper  body: No supraclavicular or axillary adenopathy.     Left upper body: No supraclavicular or axillary adenopathy.     Lower Body: No right inguinal adenopathy. No left inguinal adenopathy.  Neurological:     General: No focal deficit present.     Mental Status: He is alert and oriented to person, place, and time.  Psychiatric:        Mood and Affect: Mood normal.        Behavior: Behavior normal.      LABORATORY DATA:  I have reviewed the labs as listed.     Latest Ref Rng & Units 10/26/2021    2:19 PM 09/27/2021   12:20 PM 09/21/2021    8:02 AM  CBC  WBC 4.0 - 10.5 K/uL 6.8  12.7  9.4   Hemoglobin 13.0 - 17.0 g/dL 16.1  09.6  04.5   Hematocrit 39.0 - 52.0 % 45.0  46.9  48.6   Platelets 150 - 400 K/uL 177  208  195       Latest Ref Rng & Units 10/26/2021    2:19 PM 09/27/2021   12:20 PM 09/21/2021    8:02 AM  CMP  Glucose 70 - 99 mg/dL 409  811  94   BUN 8 - 23 mg/dL 10  21  13    Creatinine 0.61 - 1.24 mg/dL 9.14  7.82  9.56   Sodium 135 - 145 mmol/L 142  137  141   Potassium 3.5 - 5.1 mmol/L 4.0  3.0  3.5   Chloride 98 - 111 mmol/L 102  98  100   CO2 22 - 32 mmol/L 34  29  32   Calcium 8.9 - 10.3 mg/dL 9.8  9.4  9.7   Total Protein 6.5 - 8.1 g/dL 7.1  6.9  7.2   Total Bilirubin 0.3 - 1.2 mg/dL 0.8  2.1  1.1  Alkaline Phos 38 - 126 U/L 67  112  71   AST 15 - 41 U/L 26  47  33   ALT 0 - 44 U/L 18  38  27     DIAGNOSTIC IMAGING:  I have independently reviewed the scans and discussed with the patient. CT ABDOMEN PELVIS W CONTRAST  Result Date: 09/27/2021 CLINICAL DATA:  Left upper quadrant abdominal pain with vomiting. EXAM: CT ABDOMEN AND PELVIS WITH CONTRAST TECHNIQUE: Multidetector CT imaging of the abdomen and pelvis was performed using the standard protocol following bolus administration of intravenous contrast. RADIATION DOSE REDUCTION: This exam was performed according to the departmental dose-optimization program which includes automated exposure control, adjustment  of the mA and/or kV according to patient size and/or use of iterative reconstruction technique. CONTRAST:  OMNIPAQUE IOHEXOL 300 MG/ML  SOLN COMPARISON:  Multiple priors including most recent PET-CT Sep 22, 2018 CT abdomen pelvis October 21, 2020 FINDINGS: Lower chest: No acute abnormality. Hepatobiliary: Cirrhotic hepatic morphology. No suspicious hepatic lesion. Gallbladder surgically absent. Similar mild prominence of the biliary tree favored reservoir effect post cholecystectomy. Pancreas: No pancreatic ductal dilation or evidence of acute inflammation. Spleen: Splenomegaly or focal splenic lesion. Adrenals/Urinary Tract: Bilateral adrenal glands appear normal. Hypodense left upper pole renal lesion is technically too small to accurately characterize but stable from prior and statistically likely to reflect a cyst which in the absence of clinically indicated signs/symptoms require no independent follow-up. No suspicious renal mass. Urinary bladder is unremarkable for degree of distension. Stomach/Bowel: No radiopaque enteric contrast material was administered. Stomach is unremarkable for degree of distension. No pathologic dilation of small or large bowel. No evidence of acute bowel inflammation. Sigmoid colonic diverticulosis without findings of acute diverticulitis. Vascular/Lymphatic: Aortic and branch vessel atherosclerosis without abdominal aortic aneurysm. Reproductive: Prostatomegaly. Other: Similar paucity of right abdominal wall musculature. No significant abdominopelvic free fluid. Musculoskeletal: Ankylosis of the lower thoracic and lumbar spine with bilateral sacroiliitis, unchanged from prior. Left total hip arthroplasty. No acute osseous abnormality. IMPRESSION: 1. No acute abnormality within the abdomen or pelvis. 2. Sigmoid colonic diverticulosis without findings of acute diverticulitis. 3. Cirrhotic hepatic morphology with evidence of portal hypertension. 4. Ankylosis of the lower thoracic  and lumbar spine with bilateral sacroiliitis, unchanged from prior. 5. Aortic Atherosclerosis (ICD10-I70.0). Electronically Signed   By: Maudry Mayhew M.D.   On: 09/27/2021 14:57     ASSESSMENT:  1.  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  High-grade lymphoma panel was negative. - Weight loss of 40 pounds in the last 4 months, stable weight over the last 1 month.  No fevers or night sweats.  -PET CT scan on 03/16/2021 which showed hypermetabolic enlarged spleen.  There are hypermetabolic neck lymph nodes.  Diffuse hypermetabolic activity within the marrow space of the spine.  Diffuse groundglass density within the lungs with moderate activity. - 6 cycles of R-CHOP from 04/13/2021 through 08/14/2021 - PET scan (09/22/2021): No evidence of metabolically active lymphoma, [place] 1.    2.  Social/family history: - He lives at home by himself.  His son is present today with him. - He is independent of ADLs and IADLs.  Son lives within 30 minutes.  He was never smoker. - He has work-related exposure to dyes.  No pesticide exposure. - Brother died of bone cancer.  Another brother also died of bone  cancer.   PLAN:  1.  Diffuse large B-cell lymphoma, stage IVb: - At last visit he was sent to the ER.  CT scan of the abdomen and pelvis did not show any major abnormalities.  Cirrhotic hepatic morphology with evidence of portal hypertension was seen. - He does not report any abdominal pain at this time.  No B symptoms.  Energy levels are improving. - Reviewed labs today which showed normal CBC.  LFTs are normal.  LDH was normal. - Physical examination did not reveal any palpable adenopathy. - Recommend follow-up in 4 months with repeat labs.  We will do scan if there is any new symptoms or adenopathy.    2.  Parotid masses: -  Posttreatment PET scan showed bilateral hypermetabolic parotid nodules, clinically consistent with Warthin's tumors. - Referral to ENT was made.    3.  Peripheral neuropathy: -Left foot numbness is greater than right foot numbness.  Continue gabapentin 300 mg twice daily.  4.  Adrenal insufficiency: - Continue midodrine 5 mg 3 times daily, prednisone 10 mg daily and fludrocortisone 0.1 mg daily.  5.  Hypokalemia: - Continue potassium twice daily.  Potassium today is normal.   Orders placed this encounter:  No orders of the defined types were placed in this encounter.    Doreatha Massed, MD [facility] [phone]   I, Alda Ponder, am acting as a scribe for Dr. Doreatha Massed.  I, Doreatha Massed MD, have reviewed the above documentation for accuracy and completeness, and I agree with the above.

## 2021-10-30 ENCOUNTER — Encounter (HOSPITAL_COMMUNITY): Payer: Self-pay | Admitting: Lab

## 2021-11-23 ENCOUNTER — Other Ambulatory Visit (HOSPITAL_COMMUNITY): Payer: Self-pay | Admitting: Otolaryngology

## 2021-11-23 ENCOUNTER — Other Ambulatory Visit: Payer: Self-pay | Admitting: Otolaryngology

## 2021-11-23 DIAGNOSIS — D490 Neoplasm of unspecified behavior of digestive system: Secondary | ICD-10-CM

## 2021-11-24 ENCOUNTER — Encounter: Payer: Self-pay | Admitting: *Deleted

## 2021-11-24 NOTE — Progress Notes (Unsigned)
Roy Mariscal, MD  [facility] Maser OK for US guided RIGHT parotid nodule core needle Bx.   PET CT - 09/18/21 - image 37, series 3.   (Note, the LEFT sided parotid nodule is NOT amenable to Bx given extremely deep location.)   As pt with h/o lymphoma, recommend placing sample in saline.   Sedation per pt request.   Cathren Harsh

## 2021-11-30 ENCOUNTER — Encounter: Payer: Self-pay | Admitting: Urology

## 2021-11-30 ENCOUNTER — Ambulatory Visit (INDEPENDENT_AMBULATORY_CARE_PROVIDER_SITE_OTHER): Payer: Medicare Other | Admitting: Urology

## 2021-11-30 DIAGNOSIS — N401 Enlarged prostate with lower urinary tract symptoms: Secondary | ICD-10-CM

## 2021-11-30 DIAGNOSIS — R350 Frequency of micturition: Secondary | ICD-10-CM

## 2021-11-30 DIAGNOSIS — N3941 Urge incontinence: Secondary | ICD-10-CM

## 2021-11-30 DIAGNOSIS — N400 Enlarged prostate without lower urinary tract symptoms: Secondary | ICD-10-CM

## 2021-11-30 MED ORDER — GEMTESA 75 MG PO TABS: 75.0000 mg | ORAL_TABLET | Freq: Every day | ORAL | 0 refills | Status: DC

## 2021-11-30 NOTE — Progress Notes (Signed)
Subjective: 1. Benign prostatic hyperplasia, unspecified whether lower urinary tract symptoms present   2. Urge incontinence   3. Urinary frequency      Consult requested by Lars Mage NP.  11/30/21: Davius returns today in f/u for cystoscopy.  He had blurry vision and dizziness with trospium and stopped it after 10 days.  His nocturia is down to 1x.  He has daytime frequency q1hr.  He was told to drink 6 bottles of water daily.  He can avoid the UUI.  His PVR is 77m.    10/19/21: DLeonais a 71yo male who is sent for BPH with BOO and UUI.  He had a bout of food poisoning about 3 weeks ago with N/V and diarrhea but the UUI has been going on for 3-4 months.  He will leak every time he makes a step.  He is not wearing pads.  He has no hematuria or dysuria.  He has nocturia x 3 with UUI.  He has daytime frequency 10x with UUI as well.  He has no SUI.  He has a reduced stream and doesn't feel empty.  He had a CT AP on 09/27/21 and had no GU abnormalities. His bladder was not distended and his prostate wasn't too large.   He has had no UTI's or Gu surgery.  His UA is clear today.  He is on tamsulosin but that hasn't helped.  He had labs on 09/27/21 and had normal renal function.  His Bili was 2.1 with an ALT of 47 and some changes suggestive of cirrhosis on the CT.   He had a UA with many bacteria in 3/23 with Mx species on the culture but a negative UA on 09/27/21.  I don't see a PSA in his labs.  ROS:  ROS  Allergies  Allergen Reactions   Adhesive [Tape]     Contact dermatitis   Sulfa Antibiotics     UNKNOWN Childhood reaction    Past Medical History:  Diagnosis Date   Atrial fibrillation (HCC)    Cancer (HCC)    Diffuse Large B-Cell lymphoma   GERD (gastroesophageal reflux disease)    High cholesterol    Hypertension    Obesity     Past Surgical History:  Procedure Laterality Date   APPENDECTOMY     CATARACT EXTRACTION Bilateral 2022   CHOLECYSTECTOMY     ESOPHAGEAL DILATION      multiple times   IR IMAGING GUIDED PORT INSERTION  03/31/2021   JOINT REPLACEMENT Left    hip    Social History   Socioeconomic History   Marital status: Widowed    Spouse name: Not on file   Number of children: 3   Years of education: Not on file   Highest education level: Not on file  Occupational History   Occupation: Retired  Tobacco Use   Smoking status: Never    Passive exposure: Never   Smokeless tobacco: Never  Vaping Use   Vaping Use: Never used  Substance and Sexual Activity   Alcohol use: No   Drug use: No   Sexual activity: Not Currently  Other Topics Concern   Not on file  Social History Narrative   Not on file   Social Determinants of Health   Financial Resource Strain: Low Risk  (11/08/2020)   Overall Financial Resource Strain (CARDIA)    Difficulty of Paying Living Expenses: Not hard at all  Food Insecurity: No Food Insecurity (11/08/2020)   Hunger Vital Sign  Worried About Charity fundraiser in the Last Year: Never true    [place] in the Last Year: Never true  Transportation Needs: No Transportation Needs (11/08/2020)   PRAPARE - Hydrologist (Medical): No    Lack of Transportation (Non-Medical): No  Physical Activity: Sufficiently Active (11/08/2020)   Exercise Vital Sign    Days of Exercise per Week: 5 days    Minutes of Exercise per Session: 30 min  Stress: No Stress Concern Present (11/08/2020)   [place]    Feeling of Stress : Not at all  Social Connections: Moderately Isolated (11/08/2020)   Social Connection and Isolation Panel [NHANES]    Frequency of Communication with Friends and Family: More than three times a week    Frequency of Social Gatherings with Friends and Family: Three times a week    Attends Religious Services: 1 to 4 times per year    Active Member of Clubs or Organizations: No    Attends Archivist Meetings: Never     Marital Status: Widowed  Intimate Partner Violence: Not At Risk (11/08/2020)   Humiliation, Afraid, Rape, and Kick questionnaire    Fear of Current or Ex-Partner: No    Emotionally Abused: No    Physically Abused: No    Sexually Abused: No    Family History  Problem Relation Age of Onset   Heart failure Father    Heart attack Father        Deceased    Thyroid disease Sister     Anti-infectives: Anti-infectives (From admission, onward)    None       Current Outpatient Medications  Medication Sig Dispense Refill   Vibegron (GEMTESA) 75 MG TABS Take 75 mg by mouth daily. 28 tablet 0   acetaminophen (TYLENOL) 325 MG tablet Take 2 tablets (650 mg total) by mouth every 6 (six) hours as needed for headache. 30 tablet 1   Calcium Carbonate-Vitamin D (CALCIUM-VITAMIN D) 600-125 MG-UNIT TABS Take by mouth.     Cholecalciferol (VITAMIN D3) 25 MCG (1000 UT) CAPS Take by mouth daily.     ELIQUIS 5 MG TABS tablet Take 5 mg by mouth 2 (two) times daily.     EQUATE STOOL SOFTENER 100 MG capsule Take 2 capsules by mouth twice daily 120 capsule 0   FEROSUL 325 (65 Fe) MG tablet Take 1 tablet by mouth once daily with breakfast 30 tablet 0   fludrocortisone (FLORINEF) 0.1 MG tablet Take 1 tablet (0.1 mg total) by mouth daily with breakfast. 90 tablet 1   gabapentin (NEURONTIN) 300 MG capsule Take 1 capsule (300 mg total) by mouth 2 (two) times daily. 60 capsule 6   levothyroxine (SYNTHROID) 100 MCG tablet Take 1 tablet (100 mcg total) by mouth daily before breakfast. 90 tablet 1   magnesium oxide (MAG-OX) 400 (240 Mg) MG tablet Take 400 mg by mouth daily.     midodrine (PROAMATINE) 5 MG tablet Take 1 tablet (5 mg total) by mouth 3 (three) times daily with meals. 360 tablet 0   omeprazole (PRILOSEC) 10 MG capsule Take by mouth.     potassium chloride (KLOR-CON) 10 MEQ tablet Take 2 tablets (20 mEq total) by mouth 2 (two) times daily. 180 tablet 1   prednisoLONE acetate (PRED FORTE) 1 %  ophthalmic suspension Place into the left eye.     predniSONE (DELTASONE) 10 MG tablet Take 1 tablet (10  mg total) by mouth daily with breakfast. 90 tablet 1   rosuvastatin (CRESTOR) 5 MG tablet Take 1 tablet by mouth at bedtime.     trifluridine (VIROPTIC) 1 % ophthalmic solution Place 1 drop into the left eye daily.     vitamin B-12 (CYANOCOBALAMIN) 1000 MCG tablet Take 1 tablet (1,000 mcg total) by mouth daily. 30 tablet 3   No current facility-administered medications for this visit.     Objective: Vital signs in last 24 hours: There were no vitals taken for this visit.  Intake/Output from previous day: No intake/output data recorded. Intake/Output this shift: '@IOTHISSHIFT'$ @   Physical Exam  Lab Results:  No results found for this or any previous visit (from the past 24 hour(s)).   Recent Results (from the past 2160 hour(s))  CBC with Differential     Status: Abnormal   Collection Time: 09/21/21  8:02 AM  Result Value Ref Range   WBC 9.4 4.0 - 10.5 K/uL   RBC 5.37 4.22 - 5.81 MIL/uL   Hemoglobin 15.5 13.0 - 17.0 g/dL   HCT 48.6 39.0 - 52.0 %   MCV 90.5 80.0 - 100.0 fL   MCH 28.9 26.0 - 34.0 pg   MCHC 31.9 30.0 - 36.0 g/dL   RDW 15.0 11.5 - 15.5 %   Platelets 195 150 - 400 K/uL   nRBC 0.0 0.0 - 0.2 %   Neutrophils Relative % 58 %   Neutro Abs 5.5 1.7 - 7.7 K/uL   Lymphocytes Relative 29 %   Lymphs Abs 2.7 0.7 - 4.0 K/uL   Monocytes Relative 7 %   Monocytes Absolute 0.6 0.1 - 1.0 K/uL   Eosinophils Relative 3 %   Eosinophils Absolute 0.3 0.0 - 0.5 K/uL   Basophils Relative 1 %   Basophils Absolute 0.1 0.0 - 0.1 K/uL   Immature Granulocytes 2 %   Abs Immature Granulocytes 0.21 (H) 0.00 - 0.07 K/uL    Comment: Performed at [facility], [address], [place], [place] [address]  Comprehensive metabolic panel     Status: None   Collection Time: 09/21/21  8:02 AM  Result Value Ref Range   Sodium 141 135 - 145 mmol/L   Potassium 3.5 3.5 - 5.1 mmol/L   Chloride 100  98 - 111 mmol/L   CO2 32 22 - 32 mmol/L   Glucose, Bld 94 70 - 99 mg/dL    Comment: Glucose reference range applies only to samples taken after fasting for at least 8 hours.   BUN 13 8 - 23 mg/dL   Creatinine, Ser 0.74 0.61 - 1.24 mg/dL   Calcium 9.7 8.9 - 10.3 mg/dL   Total Protein 7.2 6.5 - 8.1 g/dL   Albumin 4.3 3.5 - 5.0 g/dL   AST 33 15 - 41 U/L   ALT 27 0 - 44 U/L   Alkaline Phosphatase 71 38 - 126 U/L   Total Bilirubin 1.1 0.3 - 1.2 mg/dL   GFR, Estimated >60 >60 mL/min    Comment: (NOTE) Calculated using the CKD-EPI Creatinine Equation (2021)    Anion gap 9 5 - 15    Comment: Performed at [facility], [address], [place], [place] [address]  Magnesium     Status: None   Collection Time: 09/21/21  8:02 AM  Result Value Ref Range   Magnesium 2.3 1.7 - 2.4 mg/dL    Comment: Performed at [facility], [address], [place], [place] [address]  Lactate dehydrogenase  Status: Abnormal   Collection Time: 09/21/21  8:02 AM  Result Value Ref Range   LDH 238 (H) 98 - 192 U/L    Comment: Performed at [facility], [address], [place], [place] [address]  Uric acid     Status: None   Collection Time: 09/21/21  8:02 AM  Result Value Ref Range   Uric Acid, Serum 6.3 3.7 - 8.6 mg/dL    Comment: Performed at [facility], [address], [place], [place] [address]  Phosphorus     Status: None   Collection Time: 09/21/21  8:02 AM  Result Value Ref Range   Phosphorus 4.1 2.5 - 4.6 mg/dL    Comment: Performed at [facility], [address] [place], [place] [address]  CBC with Differential     Status: Abnormal   Collection Time: 09/27/21 12:20 PM  Result Value Ref Range   WBC 12.7 (H) 4.0 - 10.5 K/uL   RBC 5.38 4.22 - 5.81 MIL/uL   Hemoglobin 15.5 13.0 - 17.0 g/dL   HCT 46.9 39.0 - 52.0 %   MCV 87.2 80.0 - 100.0 fL   MCH 28.8 26.0 - 34.0 pg   MCHC 33.0 30.0 - 36.0 g/dL   RDW 14.9 11.5 - 15.5 %   Platelets 208 150 - 400 K/uL   nRBC 0.0 0.0 - 0.2 %   Neutrophils Relative  % 60 %   Neutro Abs 8.0 (H) 1.7 - 7.7 K/uL   Band Neutrophils 3 %   Lymphocytes Relative 27 %   Lymphs Abs 3.4 0.7 - 4.0 K/uL   Monocytes Relative 7 %   Monocytes Absolute 0.9 0.1 - 1.0 K/uL   Eosinophils Relative 2 %   Eosinophils Absolute 0.3 0.0 - 0.5 K/uL   Basophils Relative 1 %   Basophils Absolute 0.1 0.0 - 0.1 K/uL   RBC Morphology MORPHOLOGY UNREMARKABLE    Smear Review MORPHOLOGY UNREMARKABLE    Hypersegmented Neutrophils PRESENT     Comment: Performed at [facility], [address] [place], [place] [address]  Comprehensive metabolic panel     Status: Abnormal   Collection Time: 09/27/21 12:20 PM  Result Value Ref Range   Sodium 137 135 - 145 mmol/L   Potassium 3.0 (L) 3.5 - 5.1 mmol/L   Chloride 98 98 - 111 mmol/L   CO2 29 22 - 32 mmol/L   Glucose, Bld 104 (H) 70 - 99 mg/dL    Comment: Glucose reference range applies only to samples taken after fasting for at least 8 hours.   BUN 21 8 - 23 mg/dL   Creatinine, Ser 0.94 0.61 - 1.24 mg/dL   Calcium 9.4 8.9 - 10.3 mg/dL   Total Protein 6.9 6.5 - 8.1 g/dL   Albumin 3.7 3.5 - 5.0 g/dL   AST 47 (H) 15 - 41 U/L   ALT 38 0 - 44 U/L   Alkaline Phosphatase 112 38 - 126 U/L   Total Bilirubin 2.1 (H) 0.3 - 1.2 mg/dL   GFR, Estimated >60 >60 mL/min    Comment: (NOTE) Calculated using the CKD-EPI Creatinine Equation (2021)    Anion gap 10 5 - 15    Comment: Performed at [facility], [address] [place], [place] [address]  Lipase, blood     Status: None   Collection Time: 09/27/21 12:20 PM  Result Value Ref Range   Lipase 20 11 - 51 U/L    Comment: Performed at [facility], [address], [place], [place]  [address]  Urinalysis, Routine w reflex microscopic Urine, Clean Catch     Status: Abnormal   Collection Time: 09/27/21 12:49 PM  Result Value Ref Range   Color, Urine YELLOW YELLOW   APPearance CLEAR CLEAR   Specific Gravity, Urine 1.045 (H) 1.005 - 1.030   pH 5.0 5.0 - 8.0   Glucose, UA NEGATIVE NEGATIVE mg/dL    Hgb urine dipstick NEGATIVE NEGATIVE   Bilirubin Urine NEGATIVE NEGATIVE   Ketones, ur NEGATIVE NEGATIVE mg/dL   Protein, ur NEGATIVE NEGATIVE mg/dL   Nitrite NEGATIVE NEGATIVE   Leukocytes,Ua NEGATIVE NEGATIVE    Comment: Performed at [facility], [address] [place], [place] [address]  POC occult blood, ED Provider will collect     Status: None   Collection Time: 09/27/21 12:51 PM  Result Value Ref Range   Occult Blood, Stool #1 negative   TSH     Status: Abnormal   Collection Time: 10/03/21  8:54 AM  Result Value Ref Range   TSH 10.200 (H) 0.450 - 4.500 uIU/mL  T4, free     Status: None   Collection Time: 10/03/21  8:54 AM  Result Value Ref Range   Free T4 1.13 0.82 - 1.77 ng/dL  Bladder Scan (Post Void Residual) in office     Status: None   Collection Time: 10/19/21 11:25 AM  Result Value Ref Range   Scan Result 35   Urinalysis, Routine w reflex microscopic     Status: None   Collection Time: 10/19/21 11:35 AM  Result Value Ref Range   Specific Gravity, UA 1.010 1.005 - 1.030   pH, UA 6.0 5.0 - 7.5   Color, UA Yellow Yellow   Appearance Ur Clear Clear   Leukocytes,UA Negative Negative   Protein,UA Negative Negative/Trace   Glucose, UA Negative Negative   Ketones, UA Negative Negative   RBC, UA Negative Negative   Bilirubin, UA Negative Negative   Urobilinogen, Ur 1.0 0.2 - 1.0 mg/dL   Nitrite, UA Negative Negative  CBC with Differential     Status: Abnormal   Collection Time: 10/26/21  2:19 PM  Result Value Ref Range   WBC 6.8 4.0 - 10.5 K/uL   RBC 5.11 4.22 - 5.81 MIL/uL   Hemoglobin 14.5 13.0 - 17.0 g/dL   HCT 45.0 39.0 - 52.0 %   MCV 88.1 80.0 - 100.0 fL   MCH 28.4 26.0 - 34.0 pg   MCHC 32.2 30.0 - 36.0 g/dL   RDW 15.5 11.5 - 15.5 %   Platelets 177 150 - 400 K/uL   nRBC 0.0 0.0 - 0.2 %   Neutrophils Relative % 74 %   Neutro Abs 5.0 1.7 - 7.7 K/uL   Lymphocytes Relative 21 %   Lymphs Abs 1.4 0.7 - 4.0 K/uL   Monocytes Relative 3 %   Monocytes  Absolute 0.2 0.1 - 1.0 K/uL   Eosinophils Relative 0 %   Eosinophils Absolute 0.0 0.0 - 0.5 K/uL   Basophils Relative 1 %   Basophils Absolute 0.0 0.0 - 0.1 K/uL   Immature Granulocytes 1 %   Abs Immature Granulocytes 0.09 (H) 0.00 - 0.07 K/uL    Comment: Performed at [facility], [address] [place], [place] [address]  Comprehensive metabolic panel     Status: Abnormal   Collection Time: 10/26/21  2:19 PM  Result Value Ref Range   Sodium 142 135 - 145 mmol/L   Potassium 4.0 3.5 - 5.1 mmol/L   Chloride 102  98 - 111 mmol/L   CO2 34 (H) 22 - 32 mmol/L   Glucose, Bld 122 (H) 70 - 99 mg/dL    Comment: Glucose reference range applies only to samples taken after fasting for at least 8 hours.   BUN 10 8 - 23 mg/dL   Creatinine, Ser 0.62 0.61 - 1.24 mg/dL   Calcium 9.8 8.9 - 10.3 mg/dL   Total Protein 7.1 6.5 - 8.1 g/dL   Albumin 4.1 3.5 - 5.0 g/dL   AST 26 15 - 41 U/L   ALT 18 0 - 44 U/L   Alkaline Phosphatase 67 38 - 126 U/L   Total Bilirubin 0.8 0.3 - 1.2 mg/dL   GFR, Estimated >60 >60 mL/min    Comment: (NOTE) Calculated using the CKD-EPI Creatinine Equation (2021)    Anion gap 6 5 - 15    Comment: Performed at [facility], [address] [place], [place] [address]  Magnesium     Status: None   Collection Time: 10/26/21  2:19 PM  Result Value Ref Range   Magnesium 2.3 1.7 - 2.4 mg/dL    Comment: Performed at [facility], [address], [place], [place] [address]  Lactate dehydrogenase     Status: Abnormal   Collection Time: 10/26/21  2:19 PM  Result Value Ref Range   LDH 293 (H) 98 - 192 U/L    Comment: Performed at [facility], [address] [place], [place] [address]  Urinalysis, Routine w reflex microscopic     Status: None   Collection Time: 11/30/21 11:29 AM  Result Value Ref Range   Specific Gravity, UA 1.010 1.005 - 1.030   pH, UA 6.5 5.0 - 7.5   Color, UA Yellow Yellow   Appearance Ur Clear Clear   Leukocytes,UA Negative Negative   Protein,UA Negative  Negative/Trace   Glucose, UA Negative Negative   Ketones, UA Negative Negative   RBC, UA Negative Negative   Bilirubin, UA Negative Negative   Urobilinogen, Ur 0.2 0.2 - 1.0 mg/dL   Nitrite, UA Negative Negative   Microscopic Examination Comment     Comment: Microscopic follows if indicated.    BMET No results for input(s): "NA", "K", "CL", "CO2", "GLUCOSE", "BUN", "CREATININE", "CALCIUM" in the last 72 hours. PT/INR No results for input(s): "LABPROT", "INR" in the last 72 hours. ABG No results for input(s): "PHART", "HCO3" in the last 72 hours.  Invalid input(s): "PCO2", "PO2"  Studies/Results: CT ABDOMEN PELVIS W CONTRAST  Result Date: 09/27/2021 CLINICAL DATA:  Left upper quadrant abdominal pain with vomiting. EXAM: CT ABDOMEN AND PELVIS WITH CONTRAST TECHNIQUE: Multidetector CT imaging of the abdomen and pelvis was performed using the standard protocol following bolus administration of intravenous contrast. RADIATION DOSE REDUCTION: This exam was performed according to the departmental dose-optimization program which includes automated exposure control, adjustment of the mA and/or kV according to patient size and/or use of iterative reconstruction technique. CONTRAST:  141m OMNIPAQUE IOHEXOL 300 MG/ML  SOLN COMPARISON:  Multiple priors including most recent PET-CT Sep 22, 2018 CT abdomen pelvis October 21, 2020 FINDINGS: Lower chest: No acute abnormality. Hepatobiliary: Cirrhotic hepatic morphology. No suspicious hepatic lesion. Gallbladder surgically absent. Similar mild prominence of the biliary tree favored reservoir effect post cholecystectomy. Pancreas: No pancreatic ductal dilation or evidence of acute inflammation. Spleen: Splenomegaly or focal splenic lesion. Adrenals/Urinary Tract: Bilateral adrenal glands appear normal. Hypodense left upper pole renal lesion is technically too small to accurately characterize but stable from prior and statistically likely to  reflect a cyst which in  the absence of clinically indicated signs/symptoms require no independent follow-up. No suspicious renal mass. Urinary bladder is unremarkable for degree of distension. Stomach/Bowel: No radiopaque enteric contrast material was administered. Stomach is unremarkable for degree of distension. No pathologic dilation of small or large bowel. No evidence of acute bowel inflammation. Sigmoid colonic diverticulosis without findings of acute diverticulitis. Vascular/Lymphatic: Aortic and branch vessel atherosclerosis without abdominal aortic aneurysm. Reproductive: Prostatomegaly. Other: Similar paucity of right abdominal wall musculature. No significant abdominopelvic free fluid. Musculoskeletal: Ankylosis of the lower thoracic and lumbar spine with bilateral sacroiliitis, unchanged from prior. Left total hip arthroplasty. No acute osseous abnormality. IMPRESSION: 1. No acute abnormality within the abdomen or pelvis. 2. Sigmoid colonic diverticulosis without findings of acute diverticulitis. 3. Cirrhotic hepatic morphology with evidence of portal hypertension. 4. 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 5. Aortic Atherosclerosis (ICD10-I70.0). Electronically Signed   By: Dahlia Bailiff M.D.   On: 09/27/2021 14:57   NM PET Image Restag (PS) Skull Base To Thigh  Result Date: 09/22/2021 CLINICAL DATA:  Subsequent treatment strategy for large B-cell lymphoma. EXAM: NUCLEAR MEDICINE PET SKULL BASE TO THIGH TECHNIQUE: 11.8 mCi F-18 FDG was injected intravenously. Full-ring PET imaging was performed from the skull base to thigh after the radiotracer. CT data was obtained and used for attenuation correction and anatomic localization. Fasting blood glucose: 79 mg/dl COMPARISON:  06/08/2021 and CT neck 03/28/2021. FINDINGS: Mediastinal blood pool activity: SUV max 2.5 Liver activity: SUV max 3.8 NECK: Hypermetabolic right parotid nodules measure up to 0.9 x 1.5 cm (3/73)  and SUV max 11.9, stable. Medial left parotid nodule is stable, measuring 11 x 17 mm and SUV max 11.9 compared to 10.0 previously. No hypermetabolic lymph nodes. Incidental CT findings: None. CHEST: No abnormal hypermetabolism. Incidental CT findings: Right IJ Port-A-Cath terminates at the SVC RA junction. Atherosclerotic calcification of the aorta, aortic valve and coronary arteries. Enlarged pulmonic trunk and heart. No pericardial or pleural effusion. ABDOMEN/PELVIS: No abnormal hypermetabolism in the liver, adrenal glands, spleen or pancreas. No hypermetabolic nodes. Incidental CT findings: Liver margin is slightly irregular with mild hypertrophy of the left hepatic lobe and caudate. Cholecystectomy. Adrenal glands, kidneys, spleen, pancreas, stomach and bowel are grossly unremarkable. Prostate is enlarged. SKELETON: No abnormal hypermetabolism. Incidental CT findings: Left hip arthroplasty.  Degenerative changes in the spine. IMPRESSION: 1. No evidence metabolically active lymphoma ([place] 1). 2. Hypermetabolic bilateral parotid nodules, stable and thought to represent possible Warthin's tumors on CT neck 03/28/2021. 3. Suspect cirrhosis. 4. Enlarged prostate. 5. Aortic atherosclerosis (ICD10-I70.0). Coronary artery calcification. 6. Enlarged pulmonic trunk, indicative of pulmonary arterial hypertension. Electronically Signed   By: Lorin Picket M.D.   On: 09/22/2021 14:10    PVR is 4m.   Assessment/Plan: BPH with UUI.   He is emptying better and has less nocturia.  He couldn't tolerate Trospium.   I don't think he needs cystoscopy.  I will try him on Gemtesa '75mg'$  and will have him return in 4 weeks.     Meds ordered this encounter  Medications   Vibegron (GEMTESA) 75 MG TABS    Sig: Take 75 mg by mouth daily.    Dispense:  28 tablet    Refill:  0     Orders Placed This Encounter  Procedures   Urinalysis, Routine w reflex microscopic   BLADDER SCAN AMB NON-IMAGING     Return in about  4 weeks (around 12/28/2021)  for me or Sharee Pimple for PVR.    CC: Lars Mage NP.      Irine Seal 12/01/2021 [phone]

## 2021-12-01 LAB — URINALYSIS, ROUTINE W REFLEX MICROSCOPIC
Bilirubin, UA: NEGATIVE
Glucose, UA: NEGATIVE
Ketones, UA: NEGATIVE
Leukocytes,UA: NEGATIVE
Nitrite, UA: NEGATIVE
Protein,UA: NEGATIVE
RBC, UA: NEGATIVE
Specific Gravity, UA: 1.01 (ref 1.005–1.030)
Urobilinogen, Ur: 0.2 mg/dL (ref 0.2–1.0)
pH, UA: 6.5 (ref 5.0–7.5)

## 2021-12-15 NOTE — Congregational Nurse Program (Signed)
F/u with patient as a result of food security and delivered  senior meals to home.  

## 2021-12-26 ENCOUNTER — Ambulatory Visit (INDEPENDENT_AMBULATORY_CARE_PROVIDER_SITE_OTHER): Payer: Medicare Other | Admitting: Physician Assistant

## 2021-12-26 VITALS — BP 172/87 | HR 55 | Ht 72.0 in | Wt 243.0 lb

## 2021-12-26 DIAGNOSIS — N401 Enlarged prostate with lower urinary tract symptoms: Secondary | ICD-10-CM | POA: Diagnosis not present

## 2021-12-26 DIAGNOSIS — R339 Retention of urine, unspecified: Secondary | ICD-10-CM | POA: Diagnosis not present

## 2021-12-26 DIAGNOSIS — N400 Enlarged prostate without lower urinary tract symptoms: Secondary | ICD-10-CM

## 2021-12-26 DIAGNOSIS — R350 Frequency of micturition: Secondary | ICD-10-CM

## 2021-12-26 LAB — URINALYSIS, ROUTINE W REFLEX MICROSCOPIC
Bilirubin, UA: NEGATIVE
Glucose, UA: NEGATIVE
Ketones, UA: NEGATIVE
Leukocytes,UA: NEGATIVE
Nitrite, UA: NEGATIVE
Protein,UA: NEGATIVE
Specific Gravity, UA: 1.015 (ref 1.005–1.030)
Urobilinogen, Ur: 0.2 mg/dL (ref 0.2–1.0)
pH, UA: 7.5 (ref 5.0–7.5)

## 2021-12-26 LAB — MICROSCOPIC EXAMINATION
Bacteria, UA: NONE SEEN
Epithelial Cells (non renal): NONE SEEN /hpf (ref 0–10)
RBC, Urine: NONE SEEN /hpf (ref 0–2)
Renal Epithel, UA: NONE SEEN /hpf
WBC, UA: NONE SEEN /hpf (ref 0–5)

## 2021-12-26 LAB — BLADDER SCAN AMB NON-IMAGING: Scan Result: 289

## 2021-12-26 MED ORDER — ALFUZOSIN HCL ER 10 MG PO TB24: 10.0000 mg | ORAL_TABLET | Freq: Every day | ORAL | 0 refills | Status: DC

## 2021-12-26 NOTE — Progress Notes (Signed)
Assessment: There are no diagnoses linked to this encounter.   Plan: ***  Chief Complaint: No chief complaint on file.   HPI: Roy [name] is a 71 y.o. [name] who presents for continued evaluation of BPH and BOO and UUI. Pt placed on Gemtesa one month ago. Pt feels sxs have minimally improved with Gemtesa, but he now feels incomplete emptying and feels it has caused GERD when pt reclines.  UA= clear PVR= 246m IPSS= 13     QOL=3  11/30/21 Consult requested by KLars MageNP.  11/30/21: Roy Browreturns today in f/u for cystoscopy.  He had blurry vision and dizziness with trospium and stopped it after 10 days.  His nocturia is down to 1x.  He has daytime frequency q1hr.  He was told to drink 6 bottles of water daily.  He can avoid the UUI.  His PVR is 025m     10/19/21: Roy [name] who is sent for BPH with BOO and UUI.  He had a bout of food poisoning about 3 weeks ago with N/V and diarrhea but the UUI has been going on for 3-4 months.  He will leak every time he makes a step.  He is not wearing pads.  He has no hematuria or dysuria.  He has nocturia x 3 with UUI.  He has daytime frequency 10x with UUI as well.  He has no SUI.  He has a reduced stream and doesn't feel empty.  He had a CT AP on 09/27/21 and had no GU abnormalities. His bladder was not distended and his prostate wasn't too large.   He has had no UTI's or Gu surgery.  His UA is clear today.  He is on tamsulosin but that hasn't helped.  He had labs on 09/27/21 and had normal renal function.  His Bili was 2.1 with an ALT of 47 and some changes suggestive of cirrhosis on the CT.   He had a UA with many bacteria in 3/23 with Mx species on the culture but a negative UA on 09/27/21.  I don't see a PSA in his labs.   Portions of the above documentation were copied from a prior visit for review purposes only.  Allergies: Allergies  Allergen Reactions   Adhesive [Tape]     Contact dermatitis   Sulfa Antibiotics      UNKNOWN Childhood reaction    PMH: Past Medical History:  Diagnosis Date   Atrial fibrillation (HCC)    Cancer (HCC)    Diffuse Large B-Cell lymphoma   GERD (gastroesophageal reflux disease)    High cholesterol    Hypertension    Obesity     PSH: Past Surgical History:  Procedure Laterality Date   APPENDECTOMY     CATARACT EXTRACTION Bilateral 2022   CHOLECYSTECTOMY     ESOPHAGEAL DILATION     multiple times   IR IMAGING GUIDED PORT INSERTION  03/31/2021   JOINT REPLACEMENT Left    hip    SH: Social History   Tobacco Use   Smoking status: Never    Passive exposure: Never   Smokeless tobacco: Never  Vaping Use   Vaping Use: Never used  Substance Use Topics   Alcohol use: No   Drug use: No    ROS: All other review of systems were reviewed and are negative except what is noted above in HPI  PE: BP (!) 180/102   Pulse 73   Ht 6' (1.829 m)  Wt 243 lb (110.2 kg)   BMI 32.96 kg/m  GENERAL APPEARANCE:  Well appearing, well developed, well nourished, NAD HEENT:  Atraumatic, normocephalic NECK:  Supple. Trachea midline ABDOMEN:  Soft, non-tender, no masses EXTREMITIES:  Moves all extremities well, without clubbing, cyanosis, or edema NEUROLOGIC:  Alert and oriented x 3, normal gait, CN II-XII grossly intact MENTAL STATUS:  appropriate BACK:  Non-tender to palpation, No CVAT SKIN:  Warm, dry, and intact   Results: Laboratory Data: Lab Results  Component Value Date   WBC 6.8 10/26/2021   HGB 14.5 10/26/2021   HCT 45.0 10/26/2021   MCV 88.1 10/26/2021   PLT 177 10/26/2021    Lab Results  Component Value Date   CREATININE 0.62 10/26/2021    No results found for: "PSA"  No results found for: "TESTOSTERONE"  Lab Results  Component Value Date   HGBA1C 5.1 01/08/2021    Urinalysis    Component Value Date/Time   COLORURINE YELLOW 09/27/2021 1249   APPEARANCEUR Clear 11/30/2021 1129   LABSPEC 1.045 (H) 09/27/2021 1249   PHURINE 5.0 09/27/2021  1249   GLUCOSEU Negative 11/30/2021 1129   HGBUR NEGATIVE 09/27/2021 1249   BILIRUBINUR Negative 11/30/2021 1129   KETONESUR NEGATIVE 09/27/2021 1249   PROTEINUR Negative 11/30/2021 1129   PROTEINUR NEGATIVE 09/27/2021 1249   UROBILINOGEN 1.0 05/15/2010 2059   NITRITE Negative 11/30/2021 1129   NITRITE NEGATIVE 09/27/2021 1249   LEUKOCYTESUR Negative 11/30/2021 1129   LEUKOCYTESUR NEGATIVE 09/27/2021 1249    Lab Results  Component Value Date   LABMICR Comment 11/30/2021   BACTERIA MANY (A) 07/13/2021    Pertinent Imaging: No results found for this or any previous visit.  No results found for this or any previous visit.  No results found for this or any previous visit.  No results found for this or any previous visit.  No results found for this or any previous visit.  No results found for this or any previous visit.  No results found for this or any previous visit.  No results found for this or any previous visit.  No results found for this or any previous visit (from the past 24 hour(s)).

## 2021-12-26 NOTE — Progress Notes (Unsigned)
post void residual =220m

## 2021-12-26 NOTE — Patient Instructions (Addendum)
Stop [employer] Uroxatrol today See Dr. Lissa Hoard ASAP for BP check and abdominal pain

## 2021-12-28 DIAGNOSIS — D490 Neoplasm of unspecified behavior of digestive system: Secondary | ICD-10-CM | POA: Insufficient documentation

## 2021-12-28 DIAGNOSIS — G5792 Unspecified mononeuropathy of left lower limb: Secondary | ICD-10-CM | POA: Insufficient documentation

## 2022-01-02 ENCOUNTER — Other Ambulatory Visit (HOSPITAL_COMMUNITY): Payer: Self-pay | Admitting: Physician Assistant

## 2022-01-03 ENCOUNTER — Other Ambulatory Visit: Payer: Self-pay | Admitting: Radiology

## 2022-01-04 ENCOUNTER — Ambulatory Visit (HOSPITAL_COMMUNITY): Admission: RE | Admit: 2022-01-04 | Payer: Medicare Other | Source: Ambulatory Visit

## 2022-01-04 ENCOUNTER — Encounter (HOSPITAL_COMMUNITY): Payer: Self-pay

## 2022-01-19 ENCOUNTER — Other Ambulatory Visit: Payer: Self-pay | Admitting: Physician Assistant

## 2022-01-19 DIAGNOSIS — R339 Retention of urine, unspecified: Secondary | ICD-10-CM

## 2022-01-19 DIAGNOSIS — N400 Enlarged prostate without lower urinary tract symptoms: Secondary | ICD-10-CM

## 2022-02-01 ENCOUNTER — Ambulatory Visit (INDEPENDENT_AMBULATORY_CARE_PROVIDER_SITE_OTHER): Payer: Medicare Other | Admitting: Urology

## 2022-02-01 ENCOUNTER — Encounter: Payer: Self-pay | Admitting: Urology

## 2022-02-01 VITALS — BP 151/86 | HR 79

## 2022-02-01 DIAGNOSIS — N400 Enlarged prostate without lower urinary tract symptoms: Secondary | ICD-10-CM

## 2022-02-01 DIAGNOSIS — R339 Retention of urine, unspecified: Secondary | ICD-10-CM | POA: Diagnosis not present

## 2022-02-01 DIAGNOSIS — N3941 Urge incontinence: Secondary | ICD-10-CM | POA: Diagnosis not present

## 2022-02-01 DIAGNOSIS — R350 Frequency of micturition: Secondary | ICD-10-CM

## 2022-02-01 LAB — BLADDER SCAN AMB NON-IMAGING: Scan Result: 10

## 2022-02-01 MED ORDER — ALFUZOSIN HCL ER 10 MG PO TB24: ORAL_TABLET | ORAL | 3 refills | Status: DC

## 2022-02-01 NOTE — Progress Notes (Signed)
post void residual=10

## 2022-02-01 NOTE — Progress Notes (Signed)
Subjective: 1. Benign prostatic hyperplasia, unspecified whether lower urinary tract symptoms present   2. Urinary frequency   3. Incomplete bladder emptying   4. Urge incontinence      02/01/22: Roy [name] in f/u.  He is on Uroxatrol and off of Gemtesa since his last visit.  He is voiding better.  He has urgency and can have some accidents if not careful.  He can have some terminal intermittency. He has rare nocturia.   His PVR is down to 66m from over 2049m.  His UA is clear.      11/30/21: Roy [name] [name] in f/u for cystoscopy.  He had blurry vision and dizziness with trospium and stopped it after 10 days.  His nocturia is down to 1x.  He has daytime frequency q1hr.  He was told to drink 6 bottles of water daily.  He can avoid the UUI.  His PVR is 104m15m   10/19/21: Roy [name] a 71 12 male who is sent for BPH with BOO and UUI.  He had a bout of food poisoning about 3 weeks ago with N/V and diarrhea but the UUI has been going on for 3-4 months.  He will leak every time he makes a step.  He is not wearing pads.  He has no hematuria or dysuria.  He has nocturia x 3 with UUI.  He has daytime frequency 10x with UUI as well.  He has no SUI.  He has a reduced stream and doesn't feel empty.  He had a CT AP on 09/27/21 and had no GU abnormalities. His bladder was not distended and his prostate wasn't too large.   He has had no UTI's or Gu surgery.  His UA is clear [name].  He is on tamsulosin but that hasn't helped.  He had labs on 09/27/21 and had normal renal function.  His Bili was 2.1 with an ALT of 47 and some changes suggestive of cirrhosis on the CT.   He had a UA with many bacteria in 3/23 with Mx species on the culture but a negative UA on 09/27/21.  I don't see a PSA in his labs.  ROS:  Review of Systems  HENT:  Positive for congestion.   Neurological:  Positive for headaches.    Allergies  Allergen Reactions   Adhesive [Tape]     Contact dermatitis   Sulfa Antibiotics     UNKNOWN  Childhood reaction    Past Medical History:  Diagnosis Date   [facility] ([facility]  [place]cer ([facility]  Diffuse Large B-Cell lymphoma   GERD (gastroesophageal reflux disease)    High cholesterol    Hypertension    Obesity     Past Surgical History:  Procedure Laterality Date   APPENDECTOMY     CATARACT EXTRACTION Bilateral 2022   CHOLECYSTECTOMY     ESOPHAGEAL DILATION     multiple times   IR IMAGING GUIDED PORT INSERTION  03/31/2021   JOINT REPLACEMENT Left    hip    Social History   Socioeconomic History   Marital status: Widowed    Spouse name: Not on file   Number of children: 3   Years of education: Not on file   Highest education level: Not on file  Occupational History   Occupation: Retired  Tobacco Use   Smoking status: Never    Passive exposure: Never   Smokeless tobacco: Never  Vaping Use   Vaping Use: Never used  Substance and Sexual Activity   Alcohol use: No   Drug use: No   Sexual activity: Not Currently  Other Topics Concern   Not on file  Social History Narrative   Not on file   Social Determinants of Health   Financial Resource Strain: Low Risk  (11/08/2020)   Overall Financial Resource Strain (CARDIA)    Difficulty of Paying Living Expenses: Not hard at all  Food Insecurity: No Food Insecurity (11/08/2020)   Hunger Vital Sign    Worried About Running Out of Food in the Last Year: Never true    Ran Out of Food in the Last Year: Never true  Transportation Needs: No Transportation Needs (11/08/2020)   PRAPARE - Hydrologist (Medical): No    Lack of Transportation (Non-Medical): No  Physical Activity: Sufficiently Active (11/08/2020)   Exercise Vital Sign    Days of Exercise per Week: 5 days    Minutes of Exercise per Session: 30 min  Stress: No Stress Concern Present (11/08/2020)   [place]    Feeling of Stress : Not at all  Social Connections:  Moderately Isolated (11/08/2020)   Social Connection and Isolation Panel [NHANES]    Frequency of Communication with Friends and Family: More than three times a week    Frequency of Social Gatherings with Friends and Family: Three times a week    Attends Religious Services: 1 to 4 times per year    Active Member of Clubs or Organizations: No    Attends Archivist Meetings: Never    Marital Status: Widowed  Intimate Partner Violence: Not At Risk (11/08/2020)   Humiliation, Afraid, Rape, and Kick questionnaire    Fear of Current or Ex-Partner: No    Emotionally Abused: No    Physically Abused: No    Sexually Abused: No    Family History  Problem Relation Age of Onset   Heart failure Father    Heart attack Father        Deceased    Thyroid disease Sister     Anti-infectives: Anti-infectives (From admission, onward)    None       Current Outpatient Medications  Medication Sig Dispense Refill   acetaminophen (TYLENOL) 325 MG tablet Take 2 tablets (650 mg total) by mouth every 6 (six) hours as needed for headache. 30 tablet 1   alfuzosin (UROXATRAL) 10 MG 24 hr tablet TAKE ONE TABLET DAILY WITH BREAKFAST 90 tablet 3   Calcium Carbonate-Vitamin D (CALCIUM-VITAMIN D) 600-125 MG-UNIT TABS Take by mouth.     Cholecalciferol (VITAMIN D3) 25 MCG (1000 UT) CAPS Take by mouth daily.     ELIQUIS 5 MG TABS tablet Take 5 mg by mouth 2 (two) times daily.     EQUATE STOOL SOFTENER 100 MG capsule Take 2 capsules by mouth twice daily 120 capsule 0   FEROSUL 325 (65 Fe) MG tablet Take 1 tablet by mouth once daily with breakfast 30 tablet 0   fludrocortisone (FLORINEF) 0.1 MG tablet Take 1 tablet (0.1 mg total) by mouth daily with breakfast. 90 tablet 1   levothyroxine (SYNTHROID) 100 MCG tablet Take 1 tablet (100 mcg total) by mouth daily before breakfast. 90 tablet 1   magnesium oxide (MAG-OX) 400 (240 Mg) MG tablet Take 400 mg by mouth daily.     omeprazole (PRILOSEC) 10 MG capsule Take  by mouth.     potassium chloride (KLOR-CON) 10 MEQ tablet  Take 2 tablets (20 mEq total) by mouth 2 (two) times daily. 180 tablet 1   prednisoLONE acetate (PRED FORTE) 1 % ophthalmic suspension Place into the left eye.     predniSONE (DELTASONE) 10 MG tablet Take 1 tablet (10 mg total) by mouth daily with breakfast. 90 tablet 1   rosuvastatin (CRESTOR) 5 MG tablet Take 1 tablet by mouth at bedtime.     trifluridine (VIROPTIC) 1 % ophthalmic solution Place 1 drop into the left eye daily.     vitamin B-12 (CYANOCOBALAMIN) 1000 MCG tablet Take 1 tablet (1,000 mcg total) by mouth daily. 30 tablet 3   No current facility-administered medications for this visit.     Objective: Vital signs in last 24 hours: BP (!) 151/86   Pulse 79   Intake/Output from previous day: No intake/output data recorded. Intake/Output this shift: '@IOTHISSHIFT'$ @   Physical Exam  Lab Results:  No results found for this or any previous visit (from the past 24 hour(s)).   Recent Results (from the past 2160 hour(s))  Urinalysis, Routine w reflex microscopic     Status: None   Collection Time: 11/30/21 11:29 AM  Result Value Ref Range   Specific Gravity, UA 1.010 1.005 - 1.030   pH, UA 6.5 5.0 - 7.5   Color, UA Yellow Yellow   Appearance Ur Clear Clear   Leukocytes,UA Negative Negative   Protein,UA Negative Negative/Trace   Glucose, UA Negative Negative   Ketones, UA Negative Negative   RBC, UA Negative Negative   Bilirubin, UA Negative Negative   Urobilinogen, Ur 0.2 0.2 - 1.0 mg/dL   Nitrite, UA Negative Negative   Microscopic Examination Comment     Comment: Microscopic follows if indicated.  BLADDER SCAN AMB NON-IMAGING     Status: None   Collection Time: 12/26/21  9:58 AM  Result Value Ref Range   Scan Result 289   Urinalysis, Routine w reflex microscopic     Status: Abnormal   Collection Time: 12/26/21 10:28 AM  Result Value Ref Range   Specific Gravity, UA 1.015 1.005 - 1.030   pH, UA 7.5 5.0 -  7.5   Color, UA Yellow Yellow   Appearance Ur Clear Clear   Leukocytes,UA Negative Negative   Protein,UA Negative Negative/Trace   Glucose, UA Negative Negative   Ketones, UA Negative Negative   RBC, UA Trace (A) Negative   Bilirubin, UA Negative Negative   Urobilinogen, Ur 0.2 0.2 - 1.0 mg/dL   Nitrite, UA Negative Negative   Microscopic Examination See below:   Microscopic Examination     Status: None   Collection Time: 12/26/21 10:28 AM   Urine  Result Value Ref Range   WBC, UA None seen 0 - 5 /hpf   RBC, Urine None seen 0 - 2 /hpf   Epithelial Cells (non renal) None seen 0 - 10 /hpf   Renal Epithel, UA None seen None seen /hpf   Bacteria, UA None seen None seen/Few  BLADDER SCAN AMB NON-IMAGING     Status: None   Collection Time: 02/01/22  1:31 PM  Result Value Ref Range   Scan Result 10     BMET No results for input(s): "NA", "K", "CL", "CO2", "GLUCOSE", "BUN", "CREATININE", "CALCIUM" in the last 72 hours. PT/INR No results for input(s): "LABPROT", "INR" in the last 72 hours. ABG No results for input(s): "PHART", "HCO3" in the last 72 hours.  Invalid input(s): "PCO2", "PO2"  Studies/Results: No results found.  PVR is 69m.  Assessment/Plan: BPH with UUI.   He is emptying better and has less nocturia.  He couldn't tolerate Trospium.   I don't think he needs cystoscopy.  I will try him on Gemtesa '75mg'$  and will have him return in 4 weeks.     Meds ordered this encounter  Medications   alfuzosin (UROXATRAL) 10 MG 24 hr tablet    Sig: TAKE ONE TABLET DAILY WITH BREAKFAST    Dispense:  90 tablet    Refill:  3     Orders Placed This Encounter  Procedures   Urinalysis, Routine w reflex microscopic   BLADDER SCAN AMB NON-IMAGING     Return in about 6 months (around 08/02/2022) for with PVR.    CC: Lars Mage NP.      Roy [name] 02/01/2022 [phone] ID: Roy [name], male   DOB: 02-14-1951, 71 y.o.   MRN: [id number]

## 2022-02-02 LAB — URINALYSIS, ROUTINE W REFLEX MICROSCOPIC
Bilirubin, UA: NEGATIVE
Glucose, UA: NEGATIVE
Ketones, UA: NEGATIVE
Leukocytes,UA: NEGATIVE
Nitrite, UA: NEGATIVE
Protein,UA: NEGATIVE
Specific Gravity, UA: 1.01 (ref 1.005–1.030)
Urobilinogen, Ur: 0.2 mg/dL (ref 0.2–1.0)
pH, UA: 5 (ref 5.0–7.5)

## 2022-02-02 LAB — MICROSCOPIC EXAMINATION: Bacteria, UA: NONE SEEN

## 2022-02-14 ENCOUNTER — Other Ambulatory Visit: Payer: Self-pay | Admitting: "Endocrinology

## 2022-02-20 ENCOUNTER — Inpatient Hospital Stay: Payer: Medicare Other | Attending: Hematology

## 2022-02-20 ENCOUNTER — Other Ambulatory Visit (HOSPITAL_COMMUNITY)
Admission: RE | Admit: 2022-02-20 | Discharge: 2022-02-20 | Disposition: A | Discharge status: Home / Self Care | Payer: Medicare Other | Source: Ambulatory Visit | Attending: "Endocrinology | Admitting: "Endocrinology

## 2022-02-20 DIAGNOSIS — G629 Polyneuropathy, unspecified: Secondary | ICD-10-CM | POA: Diagnosis not present

## 2022-02-20 DIAGNOSIS — I10 Essential (primary) hypertension: Secondary | ICD-10-CM | POA: Diagnosis not present

## 2022-02-20 DIAGNOSIS — I4891 Unspecified atrial fibrillation: Secondary | ICD-10-CM | POA: Diagnosis not present

## 2022-02-20 DIAGNOSIS — E2740 Unspecified adrenocortical insufficiency: Secondary | ICD-10-CM | POA: Insufficient documentation

## 2022-02-20 DIAGNOSIS — Z7952 Long term (current) use of systemic steroids: Secondary | ICD-10-CM | POA: Diagnosis not present

## 2022-02-20 DIAGNOSIS — Z79899 Other long term (current) drug therapy: Secondary | ICD-10-CM | POA: Diagnosis not present

## 2022-02-20 DIAGNOSIS — C8338 Diffuse large B-cell lymphoma, lymph nodes of multiple sites: Secondary | ICD-10-CM | POA: Insufficient documentation

## 2022-02-20 DIAGNOSIS — D110 Benign neoplasm of parotid gland: Secondary | ICD-10-CM | POA: Insufficient documentation

## 2022-02-20 DIAGNOSIS — Z7901 Long term (current) use of anticoagulants: Secondary | ICD-10-CM | POA: Diagnosis not present

## 2022-02-20 LAB — COMPREHENSIVE METABOLIC PANEL
ALT: 22 U/L (ref 0–44)
AST: 28 U/L (ref 15–41)
Albumin: 3.9 g/dL (ref 3.5–5.0)
Alkaline Phosphatase: 70 U/L (ref 38–126)
Anion gap: 5 (ref 5–15)
BUN: 14 mg/dL (ref 8–23)
CO2: 32 mmol/L (ref 22–32)
Calcium: 9.6 mg/dL (ref 8.9–10.3)
Chloride: 104 mmol/L (ref 98–111)
Creatinine, Ser: 0.68 mg/dL (ref 0.61–1.24)
GFR, Estimated: >60 mL/min (ref >60)
Glucose, Bld: 126 mg/dL — ABNORMAL HIGH (ref 70–99)
Potassium: 4.2 mmol/L (ref 3.5–5.1)
Sodium: 141 mmol/L (ref 135–145)
Total Bilirubin: 1.4 mg/dL — ABNORMAL HIGH (ref 0.3–1.2)
Total Protein: 6.7 g/dL (ref 6.5–8.1)

## 2022-02-20 LAB — CBC WITH DIFFERENTIAL/PLATELET
Abs Immature Granulocytes: 0.03 10*3/uL (ref 0.00–0.07)
Basophils Absolute: 0.1 10*3/uL (ref 0.0–0.1)
Basophils Relative: 1 %
Eosinophils Absolute: 0.1 10*3/uL (ref 0.0–0.5)
Eosinophils Relative: 1 %
HCT: 48 % (ref 39.0–52.0)
Hemoglobin: 15.2 g/dL (ref 13.0–17.0)
Immature Granulocytes: 0 %
Lymphocytes Relative: 15 %
Lymphs Abs: 1.5 10*3/uL (ref 0.7–4.0)
MCH: 27.4 pg (ref 26.0–34.0)
MCHC: 31.7 g/dL (ref 30.0–36.0)
MCV: 86.6 fL (ref 80.0–100.0)
Monocytes Absolute: 0.3 10*3/uL (ref 0.1–1.0)
Monocytes Relative: 3 %
Neutro Abs: 7.6 10*3/uL (ref 1.7–7.7)
Neutrophils Relative %: 80 %
Platelets: 193 10*3/uL (ref 150–400)
RBC: 5.54 MIL/uL (ref 4.22–5.81)
RDW: 16.2 % — ABNORMAL HIGH (ref 11.5–15.5)
WBC: 9.5 10*3/uL (ref 4.0–10.5)
nRBC: 0 % (ref 0.0–0.2)

## 2022-02-20 LAB — TSH: TSH: 1.863 u[IU]/mL (ref 0.350–4.500)

## 2022-02-20 LAB — T4, FREE: Free T4: 0.88 ng/dL (ref 0.61–1.12)

## 2022-02-20 LAB — LACTATE DEHYDROGENASE: LDH: 141 U/L (ref 98–192)

## 2022-02-20 LAB — MAGNESIUM: Magnesium: 2.1 mg/dL (ref 1.7–2.4)

## 2022-02-27 ENCOUNTER — Inpatient Hospital Stay (HOSPITAL_BASED_OUTPATIENT_CLINIC_OR_DEPARTMENT_OTHER): Payer: Medicare Other | Admitting: Hematology

## 2022-02-27 VITALS — BP 146/84 | HR 67 | Temp 97.3°F | Resp 18 | Ht 72.0 in | Wt 267.0 lb

## 2022-02-27 DIAGNOSIS — C8338 Diffuse large B-cell lymphoma, lymph nodes of multiple sites: Secondary | ICD-10-CM | POA: Diagnosis not present

## 2022-02-27 MED ORDER — DULOXETINE HCL 30 MG PO CPEP: 60.0000 mg | ORAL_CAPSULE | Freq: Every day | ORAL | 3 refills | Status: DC

## 2022-02-27 NOTE — Progress Notes (Signed)
[facility] [place], [place] [address]   CLINIC:  Medical Oncology/Hematology  PCP:  Bridget Hartshorn, NP [address] [place] [id number] [phone]   REASON FOR VISIT:  Follow-up for diffuse large B-cell lymphoma, stage IVb  PRIOR THERAPY: 6 cycles of R-CHOP completed on 08/14/2021  NGS Results: not done  CURRENT THERAPY: Surveillance  BRIEF ONCOLOGIC HISTORY:  Oncology History  Large B-cell lymphoma ([facility])  03/22/2021 Initial Diagnosis   DLBCL (diffuse large B cell lymphoma) ([facility] [facility])   03/22/2021 Cancer Staging   Staging form: Hodgkin and Non-Hodgkin Lymphoma, AJCC 8th Edition - Clinical stage from 03/22/2021: Stage IV (Diffuse large B-cell lymphoma) - Signed by Derek Jack, MD on 03/22/2021   04/12/2021 - 08/16/2021 Chemotherapy   Patient is on Treatment Plan : NON-HODGKINS LYMPHOMA R-CHOP q21d       CANCER STAGING:  Cancer Staging  Large B-cell lymphoma ([facility]) Staging form: Hodgkin and Non-Hodgkin Lymphoma, AJCC 8th Edition - Clinical stage from 03/22/2021: Stage IV (Diffuse large B-cell lymphoma) - Signed by Derek Jack, MD on 03/22/2021   INTERVAL HISTORY:  [name], a 71 y.o. male, seen for follow-up of for diffuse large B-cell lymphoma.  He denies any fevers, night sweats or weight loss in the last 3 months.  He reports that his feet have been numb and burning and stinging occasionally.  He has used gabapentin 300 mg 3 times daily without help.  His PMD has started him on Lyrica 75 mg 3 times daily.  He has not seen a difference.  Midodrine was reportedly discontinued in August.  REVIEW OF SYSTEMS:  Review of Systems  Neurological:  Positive for numbness (Feet burn and sting).  All other systems reviewed and are negative.   PAST MEDICAL/SURGICAL HISTORY:  Past Medical History:  Diagnosis Date   Atrial fibrillation (HCC)    Cancer (HCC)    Diffuse Large B-Cell lymphoma   GERD  (gastroesophageal reflux disease)    High cholesterol    Hypertension    Obesity    Past Surgical History:  Procedure Laterality Date   APPENDECTOMY     CATARACT EXTRACTION Bilateral 2022   CHOLECYSTECTOMY     ESOPHAGEAL DILATION     multiple times   IR IMAGING GUIDED PORT INSERTION  03/31/2021   JOINT REPLACEMENT Left    hip    SOCIAL HISTORY:  Social History   Socioeconomic History   Marital status: Widowed    Spouse name: Not on file   Number of children: 3   Years of education: Not on file   Highest education level: Not on file  Occupational History   Occupation: Retired  Tobacco Use   Smoking status: Never    Passive exposure: Never   Smokeless tobacco: Never  Vaping Use   Vaping Use: Never used  Substance and Sexual Activity   Alcohol use: No   Drug use: No   Sexual activity: Not Currently  Other Topics Concern   Not on file  Social History Narrative   Not on file   Social Determinants of Health   Financial Resource Strain: Low Risk  (11/08/2020)   Overall Financial Resource Strain (CARDIA)    Difficulty of Paying Living Expenses: Not hard at all  Food Insecurity: No Food Insecurity (11/08/2020)   Hunger Vital Sign    Worried About Running Out of Food in the Last Year: Never true    Strawberry in  the Last Year: Never true  Transportation Needs: No Transportation Needs (11/08/2020)   PRAPARE - Hydrologist (Medical): No    Lack of Transportation (Non-Medical): No  Physical Activity: Sufficiently Active (11/08/2020)   Exercise Vital Sign    Days of Exercise per Week: 5 days    Minutes of Exercise per Session: 30 min  Stress: No Stress Concern Present (11/08/2020)   [place]    Feeling of Stress : Not at all  Social Connections: Moderately Isolated (11/08/2020)   Social Connection and Isolation Panel [NHANES]    Frequency of Communication with Friends and  Family: More than three times a week    Frequency of Social Gatherings with Friends and Family: Three times a week    Attends Religious Services: 1 to 4 times per year    Active Member of Clubs or Organizations: No    Attends Archivist Meetings: Never    Marital Status: Widowed  Intimate Partner Violence: Not At Risk (11/08/2020)   Humiliation, Afraid, Rape, and Kick questionnaire    Fear of Current or Ex-Partner: No    Emotionally Abused: No    Physically Abused: No    Sexually Abused: No    FAMILY HISTORY:  Family History  Problem Relation Age of Onset   Heart failure Father    Heart attack Father        Deceased    Thyroid disease Sister     CURRENT MEDICATIONS:  Current Outpatient Medications  Medication Sig Dispense Refill   acetaminophen (TYLENOL) 325 MG tablet Take 2 tablets (650 mg total) by mouth every 6 (six) hours as needed for headache. 30 tablet 1   alfuzosin (UROXATRAL) 10 MG 24 hr tablet TAKE ONE TABLET DAILY WITH BREAKFAST 90 tablet 3   Calcium Carbonate-Vitamin D (CALCIUM-VITAMIN D) 600-125 MG-UNIT TABS Take by mouth.     Cholecalciferol (VITAMIN D3) 25 MCG (1000 UT) CAPS Take by mouth daily.     DULoxetine (CYMBALTA) 30 MG capsule Take 2 capsules (60 mg total) by mouth daily. Take 1 capsule daily for 7 days, and increase to 2 capsules daily if tolerated 60 capsule 3   ELIQUIS 5 MG TABS tablet Take 5 mg by mouth 2 (two) times daily.     EQUATE STOOL SOFTENER 100 MG capsule Take 2 capsules by mouth twice daily 120 capsule 0   FEROSUL 325 (65 Fe) MG tablet Take 1 tablet by mouth once daily with breakfast 30 tablet 0   fludrocortisone (FLORINEF) 0.1 MG tablet Take 1 tablet (0.1 mg total) by mouth daily with breakfast. 90 tablet 1   lactulose (CHRONULAC) 10 GM/15ML solution Take by mouth.     levothyroxine (SYNTHROID) 100 MCG tablet Take 1 tablet (100 mcg total) by mouth daily before breakfast. 90 tablet 1   magnesium oxide (MAG-OX) 400 (240 Mg) MG tablet  Take 400 mg by mouth daily.     omeprazole (PRILOSEC) 10 MG capsule Take by mouth.     potassium chloride (KLOR-CON) 10 MEQ tablet TAKE TWO TABLETS TWICE DAILY 180 tablet 0   prednisoLONE acetate (PRED FORTE) 1 % ophthalmic suspension Place into the left eye.     predniSONE (DELTASONE) 10 MG tablet Take 1 tablet (10 mg total) by mouth daily with breakfast. 90 tablet 1   pregabalin (LYRICA) 75 MG capsule Take by mouth.     rosuvastatin (CRESTOR) 5 MG tablet Take 1 tablet by mouth  at bedtime.     trifluridine (VIROPTIC) 1 % ophthalmic solution Place 1 drop into the left eye daily.     vitamin B-12 (CYANOCOBALAMIN) 1000 MCG tablet Take 1 tablet (1,000 mcg total) by mouth daily. 30 tablet 3   No current facility-administered medications for this visit.    ALLERGIES:  Allergies  Allergen Reactions   Adhesive [Tape]     Contact dermatitis   Sulfa Antibiotics     UNKNOWN Childhood reaction    PHYSICAL EXAM:  Performance status (ECOG): 0 - Asymptomatic  Vitals:   02/27/22 1447  BP: (!) 146/84  Pulse: 67  Resp: 18  Temp: (!) 97.3 F (36.3 C)  SpO2: 97%   Wt Readings from Last 3 Encounters:  02/27/22 267 lb (121.1 kg)  12/26/21 243 lb (110.2 kg)  10/26/21 245 lb 3.2 oz (111.2 kg)   Physical Exam Vitals reviewed.  Constitutional:      Appearance: Normal appearance. He is obese.  Cardiovascular:     Rate and Rhythm: Normal rate and regular rhythm.     Pulses: Normal pulses.     Heart sounds: Normal heart sounds.  Pulmonary:     Effort: Pulmonary effort is normal.     Breath sounds: Normal breath sounds.  Abdominal:     Palpations: Abdomen is soft. There is no hepatomegaly, splenomegaly or mass.     Tenderness: There is no abdominal tenderness.  Musculoskeletal:     Right lower leg: No edema.     Left lower leg: No edema.  Lymphadenopathy:     Cervical: No cervical adenopathy.     Right cervical: No superficial, deep or posterior cervical adenopathy.    Left cervical: No  superficial, deep or posterior cervical adenopathy.     Upper Body:     Right upper body: No supraclavicular or axillary adenopathy.     Left upper body: No supraclavicular or axillary adenopathy.     Lower Body: No right inguinal adenopathy. No left inguinal adenopathy.  Neurological:     General: No focal deficit present.     Mental Status: He is alert and oriented to person, place, and time.  Psychiatric:        Mood and Affect: Mood normal.        Behavior: Behavior normal.      LABORATORY DATA:  I have reviewed the labs as listed.     Latest Ref Rng & Units 02/20/2022    1:11 PM 10/26/2021    2:19 PM 09/27/2021   12:20 PM  CBC  WBC 4.0 - 10.5 K/uL 9.5  6.8  12.7   Hemoglobin 13.0 - 17.0 g/dL 15.2  14.5  15.5   Hematocrit 39.0 - 52.0 % 48.0  45.0  46.9   Platelets 150 - 400 K/uL 193  177  208       Latest Ref Rng & Units 02/20/2022    1:11 PM 10/26/2021    2:19 PM 09/27/2021   12:20 PM  CMP  Glucose 70 - 99 mg/dL 126  122  104   BUN 8 - 23 mg/dL 14  10  21    Creatinine 0.61 - 1.24 mg/dL 0.68  0.62  0.94   Sodium 135 - 145 mmol/L 141  142  137   Potassium 3.5 - 5.1 mmol/L 4.2  4.0  3.0   Chloride 98 - 111 mmol/L 104  102  98   CO2 22 - 32 mmol/L 32  34  29   Calcium  8.9 - 10.3 mg/dL 9.6  9.8  9.4   Total Protein 6.5 - 8.1 g/dL 6.7  7.1  6.9   Total Bilirubin 0.3 - 1.2 mg/dL 1.4  0.8  2.1   Alkaline Phos 38 - 126 U/L 70  67  112   AST 15 - 41 U/L 28  26  47   ALT 0 - 44 U/L 22  18  38     DIAGNOSTIC IMAGING:  I have independently reviewed the scans and discussed with the patient. No results found.   ASSESSMENT:  1.  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  High-grade lymphoma panel was negative. - Weight loss of 40 pounds in the last 4 months, stable weight over the last 1 month.  No fevers or  night sweats.  -PET CT scan on 03/16/2021 which showed hypermetabolic enlarged spleen.  There are hypermetabolic neck lymph nodes.  Diffuse hypermetabolic activity within the marrow space of the spine.  Diffuse groundglass density within the lungs with moderate activity. - 6 cycles of R-CHOP from 04/13/2021 through 08/14/2021 - PET scan (09/22/2021): No evidence of metabolically active lymphoma, [place] 1.    2.  Social/family history: - He lives at home by himself.  His son is present today with him. - He is independent of ADLs and IADLs.  Son lives within 30 minutes.  He was never smoker. - He has work-related exposure to dyes.  No pesticide exposure. - Brother died of bone cancer.  Another brother also died of bone cancer.   PLAN:  1.  Diffuse large B-cell lymphoma, stage IVb: - He does not report any signs or symptoms of recurrence at this time. - Reviewed labs today which showed normal LFTs and CBC.  LDH was normal.  TSH was normal. - Physical examination did not reveal any palpable splenomegaly or lymphadenopathy. - Recommend follow-up in 4 months with repeat labs.  We will do scans if clinical condition dictates.    2.  Parotid masses: - Posttreatment PET scan showed bilateral hypermetabolic parotid nodules, clinically consistent with [place]arton's tumors. - He had to put off appointments with ENT twice. - I have asked him to reach out to ENT office again.    3.  Peripheral neuropathy: - Reported numbness in the feet and burning and stinging sensation. - Used gabapentin 300 mg 3 times daily which did not help.  Right now we have taking Lyrica 75 mg 3 times daily which is not helping. - Recommend DC Lyrica.  Start Cymbalta 30 mg daily for 1 week followed by 60 mg daily.  4.  Adrenal insufficiency: - Midodrine discontinued in August.  Continue prednisone 10 mg daily and fludrocortisone 0.1 mg daily.    Orders placed this encounter:  Orders Placed This Encounter  Procedures   CBC  with Differential/Platelet   Comprehensive metabolic panel   Lactate dehydrogenase      Derek Jack, MD [facility] [phone]

## 2022-02-27 NOTE — Patient Instructions (Signed)
Roy [name]  Discharge Instructions  You were seen and examined today by Dr. Delton Coombes.  Dr. Delton Coombes discussed your most recent lab work which revealed that everything looks good.  Dr. Delton Coombes wants you to decrease the Lyrica to once daily for the next 7 days and then stop it. He sent in Cymbalta to replace the Lyrica for you to take once daily for 7 days and then twice daily if tolerated.  Follow-up as scheduled in 4 months.    Thank you for choosing [facility] to provide your oncology and hematology care.   To afford each patient quality time with our provider, please arrive at least 15 minutes before your scheduled appointment time. You may need to reschedule your appointment if you arrive late (10 or more minutes). Arriving late affects you and other patients whose appointments are after yours.  Also, if you miss three or more appointments without notifying the office, you may be dismissed from the clinic at the provider's discretion.    Again, thank you for choosing [facility].  Our hope is that these requests will decrease the amount of time that you wait before being seen by our physicians.   If you have a lab appointment with the [facility] please come in thru the Main Entrance and check in at the main information desk.           _____________________________________________________________  Should you have questions after your visit to [facility], please contact our office at [phone] and follow the prompts.  Our office hours are 8:00 a.m. to 4:30 p.m. Monday - Thursday and 8:00 a.m. to 2:30 p.m. Friday.  Please note that voicemails left after 4:00 p.m. may not be returned until the following business day.  We are closed weekends and all major holidays.  You do have access to a nurse 24-7, just call the main number to the clinic [phone] and do not press any options, hold on the  line and a nurse will answer the phone.    For prescription refill requests, have your pharmacy contact our office and allow 72 hours.    Masks are optional in the cancer centers. If you would like for your care team to wear a mask while they are taking care of you, please let them know. You may have one support person who is at least 72 years old accompany you for your appointments.

## 2022-03-22 ENCOUNTER — Other Ambulatory Visit: Payer: Self-pay | Admitting: "Endocrinology

## 2022-04-02 ENCOUNTER — Telehealth: Payer: Self-pay | Admitting: "Endocrinology

## 2022-04-02 NOTE — Telephone Encounter (Signed)
New message    The patient had lab work drawn in Oct 17th.   Upcoming appt on 04/11/22.  Please advise on labs for upcoming appt or will patient have to be redrawn.

## 2022-04-03 NOTE — Telephone Encounter (Signed)
Dr Dorris Fetch Can you use the labs from October or does he need to repeat?

## 2022-04-11 ENCOUNTER — Encounter: Payer: Self-pay | Admitting: "Endocrinology

## 2022-04-11 ENCOUNTER — Ambulatory Visit (INDEPENDENT_AMBULATORY_CARE_PROVIDER_SITE_OTHER): Payer: Medicare Other | Admitting: "Endocrinology

## 2022-04-11 VITALS — BP 134/86 | HR 56 | Ht 72.0 in | Wt 263.8 lb

## 2022-04-11 DIAGNOSIS — E2740 Unspecified adrenocortical insufficiency: Secondary | ICD-10-CM

## 2022-04-11 DIAGNOSIS — E876 Hypokalemia: Secondary | ICD-10-CM

## 2022-04-11 DIAGNOSIS — E039 Hypothyroidism, unspecified: Secondary | ICD-10-CM

## 2022-04-11 NOTE — Patient Instructions (Signed)

## 2022-04-11 NOTE — Progress Notes (Signed)
04/11/2022, 1:32 PM   Subjective:    Patient ID: Roy [name], male    DOB: 16-Dec-1950, PCP Roy Hartshorn, NP   Past Medical History:  Diagnosis Date   Atrial fibrillation ([place])    Cancer ([place])    Diffuse Large B-Cell lymphoma   GERD (gastroesophageal reflux disease)    High cholesterol    Hypertension    Obesity    Past Surgical History:  Procedure Laterality Date   APPENDECTOMY     CATARACT EXTRACTION Bilateral 2022   CHOLECYSTECTOMY     ESOPHAGEAL DILATION     multiple times   IR IMAGING GUIDED PORT INSERTION  03/31/2021   JOINT REPLACEMENT Left    hip   Social History   Socioeconomic History   Marital status: Widowed    Spouse name: Not on file   Number of children: 3   Years of education: Not on file   Highest education level: Not on file  Occupational History   Occupation: Retired  Tobacco Use   Smoking status: Never    Passive exposure: Never   Smokeless tobacco: Never  Vaping Use   Vaping Use: Never used  Substance and Sexual Activity   Alcohol use: No   Drug use: No   Sexual activity: Not Currently  Other Topics Concern   Not on file  Social History Narrative   Not on file   Social Determinants of Health   Financial Resource Strain: Low Risk  (11/08/2020)   Overall Financial Resource Strain (CARDIA)    Difficulty of Paying Living Expenses: Not hard at all  Food Insecurity: No Food Insecurity (11/08/2020)   Hunger Vital Sign    Worried About Running Out of Food in the Last Year: Never true    [place] in the Last Year: Never true  Transportation Needs: No Transportation Needs (11/08/2020)   PRAPARE - Hydrologist (Medical): No    Lack of Transportation (Non-Medical): No  Physical Activity: Sufficiently Active (11/08/2020)   Exercise Vital Sign    Days of Exercise per Week: 5 days    Minutes of Exercise per Session: 30 min  Stress: No Stress Concern  Present (11/08/2020)   [place]    Feeling of Stress : Not at all  Social Connections: Moderately Isolated (11/08/2020)   Social Connection and Isolation Panel [NHANES]    Frequency of Communication with Friends and Family: More than three times a week    Frequency of Social Gatherings with Friends and Family: Three times a week    Attends Religious Services: 1 to 4 times per year    Active Member of Clubs or Organizations: No    Attends Archivist Meetings: Never    Marital Status: Widowed   Family History  Problem Relation Age of Onset   Heart failure Father    Heart attack Father        Deceased    Thyroid disease Sister    Outpatient Encounter Medications as of 04/11/2022  Medication Sig   valACYclovir (VALTREX) 1000 MG tablet Take 1,000 mg by mouth daily.   acetaminophen (TYLENOL) 325 MG tablet Take 2 tablets (650  mg total) by mouth every 6 (six) hours as needed for headache.   alfuzosin (UROXATRAL) 10 MG 24 hr tablet TAKE ONE TABLET DAILY WITH BREAKFAST   Calcium Carbonate-Vitamin D (CALCIUM-VITAMIN D) 600-125 MG-UNIT TABS Take by mouth.   Cholecalciferol (VITAMIN D3) 25 MCG (1000 UT) CAPS Take by mouth daily.   DULoxetine (CYMBALTA) 30 MG capsule Take 2 capsules (60 mg total) by mouth daily. Take 1 capsule daily for 7 days, and increase to 2 capsules daily if tolerated   ELIQUIS 5 MG TABS tablet Take 5 mg by mouth 2 (two) times daily.   EQUATE STOOL SOFTENER 100 MG capsule Take 2 capsules by mouth twice daily   FEROSUL 325 (65 Fe) MG tablet Take 1 tablet by mouth once daily with breakfast   fludrocortisone (FLORINEF) 0.1 MG tablet Take 1 tablet (0.1 mg total) by mouth daily with breakfast.   lactulose (CHRONULAC) 10 GM/15ML solution Take by mouth.   levothyroxine (SYNTHROID) 100 MCG tablet Take 1 tablet (100 mcg total) by mouth daily before breakfast.   magnesium oxide (MAG-OX) 400 (240 Mg) MG tablet Take  400 mg by mouth daily.   omeprazole (PRILOSEC) 10 MG capsule Take by mouth.   potassium chloride (KLOR-CON) 10 MEQ tablet TAKE TWO TABLETS TWICE DAILY   prednisoLONE acetate (PRED FORTE) 1 % ophthalmic suspension Place into the left eye.   predniSONE (DELTASONE) 10 MG tablet Take 1 tablet (10 mg total) by mouth daily with breakfast.   pregabalin (LYRICA) 75 MG capsule Take by mouth.   rosuvastatin (CRESTOR) 5 MG tablet Take 1 tablet by mouth at bedtime.   trifluridine (VIROPTIC) 1 % ophthalmic solution Place 1 drop into the left eye daily.   vitamin B-12 (CYANOCOBALAMIN) 1000 MCG tablet Take 1 tablet (1,000 mcg total) by mouth daily.   No facility-administered encounter medications on file as of 04/11/2022.   ALLERGIES: Allergies  Allergen Reactions   Adhesive [Tape]     Contact dermatitis   Sulfa Antibiotics     UNKNOWN Childhood reaction    VACCINATION STATUS: Immunization History  Administered Date(s) Administered   Influenza,inj,Quad PF,6+ Mos 04/28/2015   Moderna Sars-Covid-2 Vaccination 07/03/2019, 07/31/2019   PFIZER(Purple Top)SARS-COV-2 Vaccination 03/25/2020   Pfizer Covid-19 Vaccine Bivalent Booster 22yr & up 08/24/2021   Pneumococcal Polysaccharide-23 04/28/2015    HPI Roy KENNETHis 71y.o. male who presents today with a medical history as above. he is being seen in follow-up after he was seen in consultation for adrenal insufficiency requested by HBridget Hartshorn NP.  He also has hypothyroidism on levothyroxine.  His previsit thyroid function tests are consistent with appropriate replacement.  His previous ACTH stimulation test confirmed primary adrenal insufficiency.    He is currently on prednisone 10 mg p.o. daily, fludrocortisone 0.1 mg p.o. daily at breakfast.  Since last visit, he came off of midodrine.   He denies any history of head injury, visual field deficits, no headaches.  He denies any prior history of abdominal surgery, injury, nor  infections.   He returns with 20 pounds of weight gain after he lost 15 pounds unintentionally prior to his last visit.  He feels stronger and better, denies any swelling.  His other medical problems include hyperlipidemia on treatment with Crestor.  He has paroxysmal atrial fibrillation, left anterior fascicular block, currently on anticoagulation using Coumadin 5 mg every evening.    Review of Systems  Constitutional: + Recent weight gain, + fatigue, no subjective hyperthermia, no subjective hypothermia  Objective:  04/11/2022   10:11 AM 02/27/2022    2:47 PM 02/01/2022    1:30 PM  Vitals with BMI  Height '6\' 0"'$  '6\' 0"'$    Weight 263 lbs 13 oz 267 lbs   BMI 69.67 89.3   Systolic 810 175 102  Diastolic 86 84 86  Pulse 56 67 79    BP 134/86   Pulse (!) 56   Ht 6' (1.829 m)   Wt 263 lb 12.8 oz (119.7 kg)   BMI 35.78 kg/m   Wt Readings from Last 3 Encounters:  04/11/22 263 lb 12.8 oz (119.7 kg)  02/27/22 267 lb (121.1 kg)  12/26/21 243 lb (110.2 kg)    Physical Exam  Constitutional:  Body mass index is 35.78 kg/m.,  not in acute distress, normal state of mind, +  walks with a cane   CMP ( most recent) CMP     Component Value Date/Time   NA 141 02/20/2022 1311   NA 140 05/30/2021 0944   K 4.2 02/20/2022 1311   CL 104 02/20/2022 1311   CO2 32 02/20/2022 1311   GLUCOSE 126 (H) 02/20/2022 1311   BUN 14 02/20/2022 1311   BUN 16 05/30/2021 0944   CREATININE 0.68 02/20/2022 1311   CREATININE 0.82 11/11/2020 1046   CALCIUM 9.6 02/20/2022 1311   PROT 6.7 02/20/2022 1311   PROT 6.2 05/30/2021 0944   ALBUMIN 3.9 02/20/2022 1311   ALBUMIN 4.1 05/30/2021 0944   AST 28 02/20/2022 1311   AST 74 (H) 11/11/2020 1046   ALT 22 02/20/2022 1311   ALT 21 11/11/2020 1046   ALKPHOS 70 02/20/2022 1311   BILITOT 1.4 (H) 02/20/2022 1311   BILITOT 0.6 05/30/2021 0944   BILITOT 2.3 (H) 11/11/2020 1046   GFRNONAA >60 02/20/2022 1311   GFRNONAA >60 11/11/2020 1046   GFRAA >60  04/29/2019 1033     Diabetic Labs (most recent): Lab Results  Component Value Date   HGBA1C 5.1 01/08/2021     Lipid Panel ( most recent) Lipid Panel     Component Value Date/Time   CHOL 147 04/29/2015 0426   TRIG 153 (H) 04/29/2015 0426   HDL 19 (L) 04/29/2015 0426   CHOLHDL 7.7 04/29/2015 0426   VLDL 31 04/29/2015 0426   LDLCALC 97 04/29/2015 0426      Lab Results  Component Value Date   TSH 1.863 02/20/2022   TSH 10.200 (H) 10/03/2021   TSH 8.100 (H) 05/30/2021   TSH 0.770 02/23/2021   TSH 0.791 02/21/2021   TSH 0.036 (L) 01/08/2021   TSH 0.067 (L) 11/09/2020   TSH 0.030 (L) 10/21/2020   TSH 3.836 03/17/2018   TSH 6.505 (H) 04/27/2015   FREET4 0.88 02/20/2022   FREET4 1.13 10/03/2021   FREET4 0.75 (L) 05/30/2021   FREET4 0.31 (L) 02/21/2021   FREET4 0.74 01/09/2021   FREET4 1.12 11/09/2020   FREET4 1.75 (H) 10/21/2020   FREET4 0.98 03/17/2018   FREET4 0.93 04/28/2015       Assessment & Plan:   1. Adrenal insufficiency ([place]) 2.  Hypothyroidism  I discussed with him the need for steroids replacement.  He stands to benefit from steroid replacement.  He is advised to continue prednisone 10 mg p.o. daily at breakfast for glucocorticoid replacement.  He is also advised to continue fludrocortisone 0.1 mg p.o. daily at breakfast for his mineralocorticoid needs.   He was able to come off of midodrine due to rising blood pressure.   We discussed about sick  day rules on prednisone.  -I have emphasized the absolute need for steroid replacement on a daily basis for long-term.  Regarding his hypothyroidism: His previsit labs are consistent with appropriate replacement.  He is advised to continue levothyroxine 100 mcg p.o. daily before breakfast.   - We discussed about the correct intake of his thyroid hormone, on empty stomach at fasting, with water, separated by at least 30 minutes from breakfast and other medications,  and separated by more than 4 hours from  calcium, iron, multivitamins, acid reflux medications (PPIs). -Patient is made aware of the fact that thyroid hormone replacement is needed for life, dose to be adjusted by periodic monitoring of thyroid function tests.  His previsit labs showed normal CMP with sodium 141.   -He is advised on fall precaution, encouraged to use his cane for equilibrium.     - he is advised to maintain close follow up with [place], Karie Schwalbe, NP for primary care needs.   I spent 26 minutes in the care of the patient today including review of labs from Thyroid Function, CMP, and other relevant labs ; imaging/biopsy records (current and previous including abstractions from other facilities); face-to-face time discussing  his lab results and symptoms, medications doses, his options of short and long term treatment based on the latest standards of care / guidelines;   and documenting the encounter.  Roy [name]  participated in the discussions, expressed understanding, and voiced agreement with the above plans.  All questions were answered to his satisfaction. he is encouraged to contact clinic should he have any questions or concerns prior to his return visit.   Follow up plan: Return in about 6 months (around 10/11/2022) for F/U with Pre-visit Labs, A1c -NV.   Glade Lloyd, MD [facility] [address] [place], [place] [address] Phone: [phone]  Fax: [phone]     04/11/2022, 1:32 PM  This note was partially dictated with voice recognition software. Similar sounding words can be transcribed inadequately or may not  be corrected upon review.  Dexamethasone

## 2022-05-29 ENCOUNTER — Other Ambulatory Visit: Payer: Self-pay | Admitting: "Endocrinology

## 2022-05-29 DIAGNOSIS — E2740 Unspecified adrenocortical insufficiency: Secondary | ICD-10-CM

## 2022-06-07 ENCOUNTER — Other Ambulatory Visit: Payer: Self-pay | Admitting: "Endocrinology

## 2022-06-07 DIAGNOSIS — E2740 Unspecified adrenocortical insufficiency: Secondary | ICD-10-CM

## 2022-06-15 ENCOUNTER — Other Ambulatory Visit: Payer: Self-pay | Admitting: "Endocrinology

## 2022-06-15 DIAGNOSIS — E2740 Unspecified adrenocortical insufficiency: Secondary | ICD-10-CM

## 2022-06-19 ENCOUNTER — Other Ambulatory Visit: Payer: Self-pay | Admitting: "Endocrinology

## 2022-06-21 ENCOUNTER — Inpatient Hospital Stay: Payer: 59 | Attending: Hematology

## 2022-06-21 DIAGNOSIS — Z79899 Other long term (current) drug therapy: Secondary | ICD-10-CM | POA: Diagnosis not present

## 2022-06-21 DIAGNOSIS — R634 Abnormal weight loss: Secondary | ICD-10-CM | POA: Insufficient documentation

## 2022-06-21 DIAGNOSIS — Z7952 Long term (current) use of systemic steroids: Secondary | ICD-10-CM | POA: Insufficient documentation

## 2022-06-21 DIAGNOSIS — G629 Polyneuropathy, unspecified: Secondary | ICD-10-CM | POA: Insufficient documentation

## 2022-06-21 DIAGNOSIS — C8338 Diffuse large B-cell lymphoma, lymph nodes of multiple sites: Secondary | ICD-10-CM | POA: Diagnosis present

## 2022-06-21 DIAGNOSIS — E2740 Unspecified adrenocortical insufficiency: Secondary | ICD-10-CM | POA: Insufficient documentation

## 2022-06-21 DIAGNOSIS — I4891 Unspecified atrial fibrillation: Secondary | ICD-10-CM | POA: Insufficient documentation

## 2022-06-21 DIAGNOSIS — D110 Benign neoplasm of parotid gland: Secondary | ICD-10-CM | POA: Diagnosis not present

## 2022-06-21 DIAGNOSIS — Z7901 Long term (current) use of anticoagulants: Secondary | ICD-10-CM | POA: Diagnosis not present

## 2022-06-21 DIAGNOSIS — I10 Essential (primary) hypertension: Secondary | ICD-10-CM | POA: Insufficient documentation

## 2022-06-21 LAB — COMPREHENSIVE METABOLIC PANEL
ALT: 33 U/L (ref 0–44)
AST: 30 U/L (ref 15–41)
Albumin: 4.1 g/dL (ref 3.5–5.0)
Alkaline Phosphatase: 60 U/L (ref 38–126)
Anion gap: 9 (ref 5–15)
BUN: 15 mg/dL (ref 8–23)
CO2: 31 mmol/L (ref 22–32)
Calcium: 9 mg/dL (ref 8.9–10.3)
Chloride: 97 mmol/L — ABNORMAL LOW (ref 98–111)
Creatinine, Ser: 0.82 mg/dL (ref 0.61–1.24)
GFR, Estimated: >60 mL/min (ref >60)
Glucose, Bld: 111 mg/dL — ABNORMAL HIGH (ref 70–99)
Potassium: 3.7 mmol/L (ref 3.5–5.1)
Sodium: 137 mmol/L (ref 135–145)
Total Bilirubin: 2.4 mg/dL — ABNORMAL HIGH (ref 0.3–1.2)
Total Protein: 6.7 g/dL (ref 6.5–8.1)

## 2022-06-21 LAB — CBC WITH DIFFERENTIAL/PLATELET
Abs Immature Granulocytes: 0.07 10*3/uL (ref 0.00–0.07)
Basophils Absolute: 0.1 10*3/uL (ref 0.0–0.1)
Basophils Relative: 1 %
Eosinophils Absolute: 0.1 10*3/uL (ref 0.0–0.5)
Eosinophils Relative: 1 %
HCT: 48.5 % (ref 39.0–52.0)
Hemoglobin: 15.4 g/dL (ref 13.0–17.0)
Immature Granulocytes: 1 %
Lymphocytes Relative: 13 %
Lymphs Abs: 1.4 10*3/uL (ref 0.7–4.0)
MCH: 30.1 pg (ref 26.0–34.0)
MCHC: 31.8 g/dL (ref 30.0–36.0)
MCV: 94.7 fL (ref 80.0–100.0)
Monocytes Absolute: 0.8 10*3/uL (ref 0.1–1.0)
Monocytes Relative: 8 %
Neutro Abs: 8.6 10*3/uL — ABNORMAL HIGH (ref 1.7–7.7)
Neutrophils Relative %: 76 %
Platelets: 142 10*3/uL — ABNORMAL LOW (ref 150–400)
RBC: 5.12 MIL/uL (ref 4.22–5.81)
RDW: 16.4 % — ABNORMAL HIGH (ref 11.5–15.5)
WBC: 11 10*3/uL — ABNORMAL HIGH (ref 4.0–10.5)
nRBC: 0.2 % (ref 0.0–0.2)

## 2022-06-21 LAB — LACTATE DEHYDROGENASE: LDH: 169 U/L (ref 98–192)

## 2022-06-28 ENCOUNTER — Inpatient Hospital Stay (HOSPITAL_BASED_OUTPATIENT_CLINIC_OR_DEPARTMENT_OTHER): Payer: 59 | Admitting: Hematology

## 2022-06-28 VITALS — BP 184/88 | HR 55 | Temp 98.3°F | Resp 18 | Ht 72.0 in | Wt 282.7 lb

## 2022-06-28 DIAGNOSIS — D110 Benign neoplasm of parotid gland: Secondary | ICD-10-CM

## 2022-06-28 DIAGNOSIS — Z7952 Long term (current) use of systemic steroids: Secondary | ICD-10-CM

## 2022-06-28 DIAGNOSIS — E2740 Unspecified adrenocortical insufficiency: Secondary | ICD-10-CM | POA: Diagnosis not present

## 2022-06-28 DIAGNOSIS — G629 Polyneuropathy, unspecified: Secondary | ICD-10-CM

## 2022-06-28 DIAGNOSIS — Z79899 Other long term (current) drug therapy: Secondary | ICD-10-CM

## 2022-06-28 DIAGNOSIS — C8338 Diffuse large B-cell lymphoma, lymph nodes of multiple sites: Secondary | ICD-10-CM | POA: Diagnosis not present

## 2022-06-28 NOTE — Patient Instructions (Addendum)
[facility]  Discharge Instructions  You were seen and examined today by Dr. Delton Coombes.  Dr. Delton Coombes discussed your most recent lab work which revealed that everything looks good.  Diffuse large B-Cell Lymphoma is the condition that you have.  Follow-up as scheduled in 4 months with labs.    Thank you for choosing [facility] to provide your oncology and hematology care.   To afford each patient quality time with our provider, please arrive at least 15 minutes before your scheduled appointment time. You may need to reschedule your appointment if you arrive late (10 or more minutes). Arriving late affects you and other patients whose appointments are after yours.  Also, if you miss three or more appointments without notifying the office, you may be dismissed from the clinic at the provider's discretion.    Again, thank you for choosing [facility].  Our hope is that these requests will decrease the amount of time that you wait before being seen by our physicians.   If you have a lab appointment with the [facility] please come in thru the Main Entrance and check in at the main information desk.           _____________________________________________________________  Should you have questions after your visit to [facility], please contact our office at [phone] and follow the prompts.  Our office hours are 8:00 a.m. to 4:30 p.m. Monday - Thursday and 8:00 a.m. to 2:30 p.m. Friday.  Please note that voicemails left after 4:00 p.m. may not be returned until the following business day.  We are closed weekends and all major holidays.  You do have access to a nurse 24-7, just call the main number to the clinic [phone] and do not press any options, hold on the line and a nurse will answer the phone.    For prescription refill requests, have your pharmacy contact our office and allow 72 hours.     Masks are optional in the cancer centers. If you would like for your care team to wear a mask while they are taking care of you, please let them know. You may have one support person who is at least 73 years old accompany you for your appointments.

## 2022-06-28 NOTE — Progress Notes (Signed)
[facility] [address] [place] [address]    Clinic Day:  06/28/2022  Referring physician: Bridget Hartshorn, NP  Patient Care Team: Bridget Hartshorn, NP as PCP - General (Adult Health Nurse Practitioner) Derek Jack, MD as Medical Oncologist (Medical Oncology)   ASSESSMENT & PLAN:   Assessment: 1.  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  High-grade lymphoma panel was negative. - Weight loss of 40 pounds in the last 4 months, stable weight over the last 1 month.  No fevers or night sweats.  -PET CT scan on 03/16/2021 which showed hypermetabolic enlarged spleen.  There are hypermetabolic neck lymph nodes.  Diffuse hypermetabolic activity within the marrow space of the spine.  Diffuse groundglass density within the lungs with moderate activity. - 6 cycles of R-CHOP from 04/13/2021 through 08/14/2021 - PET scan (09/22/2021): No evidence of metabolically active lymphoma, [place] 1.    2.  Social/family history: - He lives at home by himself.  His son is present today with him. - He is independent of ADLs and IADLs.  Son lives within 30 minutes.  He was never smoker. - He has work-related exposure to dyes.  No pesticide exposure. - Brother died of bone cancer.  Another brother also died of bone cancer.    Plan: 1.  Diffuse large B-cell lymphoma, stage IVb: - Denies any B symptoms. - No palpable adenopathy. - Labs shows normal LFTs and chronically elevated total bilirubin stable.  LDH normal.  CBC grossly normal with no pancytopenia. - Recommend follow-up with labs in 4 months.    2.  Parotid masses: - Posttreatment PET scan showed bilateral hypermetabolic parotid nodules, clinically consistent with Warthin's tumor's. - He has seen Dr. Andrew Au before but never followed up  for biopsy. - Will make another referral.    3.  Peripheral neuropathy: - This is mostly in the feet with burning sensation. - Continue Lyrica 150 mg twice daily.  4.  Adrenal insufficiency: - He is on prednisone 10 mg daily and fludrocortisone 0.1 mg daily.  Orders Placed This Encounter  Procedures   CBC with Differential/Platelet    Standing Status:   Future    Standing Expiration Date:   06/29/2023    Order Specific Question:   Release to patient    Answer:   Immediate   Comprehensive metabolic panel    Standing Status:   Future    Standing Expiration Date:   06/29/2023    Order Specific Question:   Release to patient    Answer:   Immediate   Lactate dehydrogenase    Standing Status:   Future    Standing Expiration Date:   06/29/2023    Order Specific Question:   Release to patient    Answer:   Immediate      Kaleen Odea as a scribe for Derek Jack, MD.,have documented all relevant documentation on the behalf of Derek Jack, MD,as directed by  Derek Jack, MD while in the presence of Derek Jack, MD.   I, Derek Jack MD, have reviewed the above documentation for accuracy and completeness, and I agree with the above.   Derek Jack, MD   2/22/20246:02 PM  CHIEF COMPLAINT:   Diagnosis: diffuse large B-cell lymphoma, stage IVb     Cancer Staging  Large B-cell lymphoma ([place]) Staging form: Hodgkin and  Non-Hodgkin Lymphoma, AJCC 8th Edition - Clinical stage from 03/22/2021: Stage IV (Diffuse large B-cell lymphoma) - Signed by Derek Jack, MD on 03/22/2021    Prior Therapy:  6 cycles of R-CHOP completed on 08/14/2021  Current Therapy:  Surveillance     HISTORY OF PRESENT ILLNESS:   Oncology History  Large B-cell lymphoma ([place])  03/22/2021 Initial Diagnosis   DLBCL (diffuse large B cell lymphoma) ([place])   03/22/2021 Cancer Staging   Staging form: Hodgkin and Non-Hodgkin Lymphoma, AJCC 8th  Edition - Clinical stage from 03/22/2021: Stage IV (Diffuse large B-cell lymphoma) - Signed by Derek Jack, MD on 03/22/2021   04/12/2021 - 08/16/2021 Chemotherapy   Patient is on Treatment Plan : NON-HODGKINS LYMPHOMA R-CHOP q21d        INTERVAL HISTORY:   Roy [name] is a 72 y.o. male presenting to clinic today for follow up of diffuse large B-cell lymphoma, stage IVb. He was last seen by me on 10/24/223.  Today, he states that he is doing well overall. His appetite level is at 100%. His energy level is at 70%. He has no new problems. His feet continue to feel numb. His weight went down since last visit. He had shingles on his eye x6. He occasionally has back stool but it is believed to be an  effect of his  iron pill.   PAST MEDICAL HISTORY:   Past Medical History: Past Medical History:  Diagnosis Date   Atrial fibrillation (HCC)    Cancer ([place])    Diffuse Large B-Cell lymphoma   GERD (gastroesophageal reflux disease)    High cholesterol    Hypertension    Obesity     Surgical History: Past Surgical History:  Procedure Laterality Date   APPENDECTOMY     CATARACT EXTRACTION Bilateral 2022   CHOLECYSTECTOMY     ESOPHAGEAL DILATION     multiple times   IR IMAGING GUIDED PORT INSERTION  03/31/2021   JOINT REPLACEMENT Left    hip    Social History: Social History   Socioeconomic History   Marital status: Widowed    Spouse name: Not on file   Number of children: 3   Years of education: Not on file   Highest education level: Not on file  Occupational History   Occupation: Retired  Tobacco Use   Smoking status: Never    Passive exposure: Never   Smokeless tobacco: Never  Vaping Use   Vaping Use: Never used  Substance and Sexual Activity   Alcohol use: No   Drug use: No   Sexual activity: Not Currently  Other Topics Concern   Not on file  Social History Narrative   Not on file   Social Determinants of Health   Financial Resource Strain: Low Risk   (11/08/2020)   Overall Financial Resource Strain (CARDIA)    Difficulty of Paying Living Expenses: Not hard at all  Food Insecurity: No Food Insecurity (11/08/2020)   Hunger Vital Sign    Worried About Running Out of Food in the Last Year: Never true    [place] in the Last Year: Never true  Transportation Needs: No Transportation Needs (11/08/2020)   PRAPARE - Hydrologist (Medical): No    Lack of Transportation (Non-Medical): No  Physical Activity: Sufficiently Active (11/08/2020)   Exercise Vital Sign    Days of Exercise per Week: 5 days    Minutes of Exercise per Session: 30 min  Stress: No Stress Concern Present (  11/08/2020)   [place]    Feeling of Stress : Not at all  Social Connections: Moderately Isolated (11/08/2020)   Social Connection and Isolation Panel [NHANES]    Frequency of Communication with Friends and Family: More than three times a week    Frequency of Social Gatherings with Friends and Family: Three times a week    Attends Religious Services: 1 to 4 times per year    Active Member of Clubs or Organizations: No    Attends Archivist Meetings: Never    Marital Status: Widowed  Intimate Partner Violence: Not At Risk (11/08/2020)   Humiliation, Afraid, Rape, and Kick questionnaire    Fear of Current or Ex-Partner: No    Emotionally Abused: No    Physically Abused: No    Sexually Abused: No    Family History: Family History  Problem Relation Age of Onset   Heart failure Father    Heart attack Father        Deceased    Thyroid disease Sister     Current Medications:  Current Outpatient Medications:    acetaminophen (TYLENOL) 325 MG tablet, Take 2 tablets (650 mg total) by mouth every 6 (six) hours as needed for headache., Disp: 30 tablet, Rfl: 1   alfuzosin (UROXATRAL) 10 MG 24 hr tablet, TAKE ONE TABLET DAILY WITH BREAKFAST, Disp: 90 tablet, Rfl: 3    Calcium Carbonate-Vitamin D (CALCIUM-VITAMIN D) 600-125 MG-UNIT TABS, Take by mouth., Disp: , Rfl:    Cholecalciferol (VITAMIN D3) 25 MCG (1000 UT) CAPS, Take by mouth daily., Disp: , Rfl:    ELIQUIS 5 MG TABS tablet, Take 5 mg by mouth 2 (two) times daily., Disp: , Rfl:    EQUATE STOOL SOFTENER 100 MG capsule, Take 2 capsules by mouth twice daily, Disp: 120 capsule, Rfl: 0   FEROSUL 325 (65 Fe) MG tablet, Take 1 tablet by mouth once daily with breakfast, Disp: 30 tablet, Rfl: 0   fludrocortisone (FLORINEF) 0.1 MG tablet, TAKE ONE TABLET ONCE DAILY WITH BREAKFAST, Disp: 90 tablet, Rfl: 0   lactulose (CHRONULAC) 10 GM/15ML solution, Take by mouth., Disp: , Rfl:    levothyroxine (SYNTHROID) 100 MCG tablet, TAKE ONE TABLET ONCE DAILY BEFORE BREAKFAST, Disp: 90 tablet, Rfl: 1   magnesium oxide (MAG-OX) 400 (240 Mg) MG tablet, Take 400 mg by mouth daily., Disp: , Rfl:    omeprazole (PRILOSEC) 10 MG capsule, Take by mouth., Disp: , Rfl:    potassium chloride (KLOR-CON) 10 MEQ tablet, TAKE TWO TABLETS TWICE DAILY, Disp: 360 tablet, Rfl: 0   predniSONE (DELTASONE) 10 MG tablet, TAKE ONE TABLET ONCE DAILY WITH BREAKFAST, Disp: 90 tablet, Rfl: 0   pregabalin (LYRICA) 75 MG capsule, Take by mouth., Disp: , Rfl:    rosuvastatin (CRESTOR) 5 MG tablet, Take 1 tablet by mouth at bedtime., Disp: , Rfl:    valACYclovir (VALTREX) 1000 MG tablet, Take 1,000 mg by mouth daily., Disp: , Rfl:    vitamin B-12 (CYANOCOBALAMIN) 1000 MCG tablet, Take 1 tablet (1,000 mcg total) by mouth daily., Disp: 30 tablet, Rfl: 3   Allergies: Allergies  Allergen Reactions   Adhesive [Tape]     Contact dermatitis   Sulfa Antibiotics     UNKNOWN Childhood reaction    REVIEW OF SYSTEMS:   Review of Systems  Constitutional:  Negative for chills, fatigue and fever.  HENT:   Negative for lump/mass, mouth sores, nosebleeds, sore throat and trouble swallowing.  Eyes:  Negative for eye problems.  Respiratory:  Negative for cough  and shortness of breath.   Cardiovascular:  Negative for chest pain, leg swelling and palpitations.  Gastrointestinal:  Negative for abdominal pain, constipation, diarrhea, nausea and vomiting.  Genitourinary:  Negative for bladder incontinence, difficulty urinating, dysuria, frequency, hematuria and nocturia.   Musculoskeletal:  Negative for arthralgias, back pain, flank pain, myalgias and neck pain.  Skin:  Negative for itching and rash.  Neurological:  Positive for numbness (Tingling Hands and Feet). Negative for dizziness and headaches.  Hematological:  Does not bruise/bleed easily.  Psychiatric/Behavioral:  Negative for depression, sleep disturbance and suicidal ideas. The patient is not nervous/anxious.   All other systems reviewed and are negative.    VITALS:   Blood pressure (!) 184/88, pulse (!) 55, temperature 98.3 F (36.8 C), resp. rate 18, height 6' (1.829 m), weight 282 lb 11.2 oz (128.2 kg), SpO2 94 %.  Wt Readings from Last 3 Encounters:  06/28/22 282 lb 11.2 oz (128.2 kg)  04/11/22 263 lb 12.8 oz (119.7 kg)  02/27/22 267 lb (121.1 kg)    Body mass index is 38.34 kg/m.  Performance status (ECOG): 1 - Symptomatic but completely ambulatory  PHYSICAL EXAM:   Physical Exam Vitals and nursing note reviewed. Exam conducted with a chaperone present.  Constitutional:      Appearance: Normal appearance.  Cardiovascular:     Rate and Rhythm: Normal rate and regular rhythm.     Pulses: Normal pulses.     Heart sounds: Normal heart sounds.  Pulmonary:     Effort: Pulmonary effort is normal.     Breath sounds: Normal breath sounds.  Abdominal:     Palpations: Abdomen is soft. There is no hepatomegaly, splenomegaly or mass.     Tenderness: There is no abdominal tenderness.  Musculoskeletal:     Right lower leg: No edema.     Left lower leg: No edema.  Lymphadenopathy:     Cervical: No cervical adenopathy.     Right cervical: No superficial, deep or posterior cervical  adenopathy.    Left cervical: No superficial, deep or posterior cervical adenopathy.     Upper Body:     Right upper body: No supraclavicular or axillary adenopathy.     Left upper body: No supraclavicular or axillary adenopathy.  Neurological:     General: No focal deficit present.     Mental Status: He is alert and oriented to person, place, and time.  Psychiatric:        Mood and Affect: Mood normal.        Behavior: Behavior normal.     LABS:      Latest Ref Rng & Units 06/21/2022   12:18 PM 02/20/2022    1:11 PM 10/26/2021    2:19 PM  CBC  WBC 4.0 - 10.5 K/uL 11.0  9.5  6.8   Hemoglobin 13.0 - 17.0 g/dL 15.4  15.2  14.5   Hematocrit 39.0 - 52.0 % 48.5  48.0  45.0   Platelets 150 - 400 K/uL 142  193  177       Latest Ref Rng & Units 06/21/2022   12:18 PM 02/20/2022    1:11 PM 10/26/2021    2:19 PM  CMP  Glucose 70 - 99 mg/dL 111  126  122   BUN 8 - 23 mg/dL 15  14  10   $ Creatinine 0.61 - 1.24 mg/dL 0.82  0.68  0.62   Sodium 135 -  145 mmol/L 137  141  142   Potassium 3.5 - 5.1 mmol/L 3.7  4.2  4.0   Chloride 98 - 111 mmol/L 97  104  102   CO2 22 - 32 mmol/L 31  32  34   Calcium 8.9 - 10.3 mg/dL 9.0  9.6  9.8   Total Protein 6.5 - 8.1 g/dL 6.7  6.7  7.1   Total Bilirubin 0.3 - 1.2 mg/dL 2.4  1.4  0.8   Alkaline Phos 38 - 126 U/L 60  70  67   AST 15 - 41 U/L 30  28  26   $ ALT 0 - 44 U/L 33  22  18      No results found for: "CEA1", "CEA" / No results found for: "CEA1", "CEA" No results found for: "PSA1" No results found for: "[id number]" No results found for: "CAN125"  Lab Results  Component Value Date   TOTALPROTELP 6.1 02/28/2021   ALBUMINELP 3.5 02/21/2021   A1GS 0.3 02/21/2021   A2GS 0.8 02/21/2021   BETS 1.0 02/21/2021   GAMS 0.8 02/21/2021   MSPIKE Not Observed 02/21/2021   SPEI Comment 02/21/2021   Lab Results  Component Value Date   TIBC 416 02/21/2021   TIBC 369 11/11/2020   FERRITIN 152 02/21/2021   IRONPCTSAT 17 (L) 02/21/2021   IRONPCTSAT 41  (H) 11/11/2020   Lab Results  Component Value Date   LDH 169 06/21/2022   LDH 141 02/20/2022   LDH 293 (H) 10/26/2021     STUDIES:   No results found.

## 2022-07-05 ENCOUNTER — Encounter: Payer: Self-pay | Admitting: Urology

## 2022-07-05 ENCOUNTER — Ambulatory Visit (INDEPENDENT_AMBULATORY_CARE_PROVIDER_SITE_OTHER): Payer: 59 | Admitting: Urology

## 2022-07-05 VITALS — BP 101/67 | HR 65 | Ht 72.0 in | Wt 282.7 lb

## 2022-07-05 DIAGNOSIS — N3941 Urge incontinence: Secondary | ICD-10-CM

## 2022-07-05 DIAGNOSIS — N401 Enlarged prostate with lower urinary tract symptoms: Secondary | ICD-10-CM | POA: Diagnosis not present

## 2022-07-05 DIAGNOSIS — R350 Frequency of micturition: Secondary | ICD-10-CM | POA: Diagnosis not present

## 2022-07-05 DIAGNOSIS — N3000 Acute cystitis without hematuria: Secondary | ICD-10-CM | POA: Diagnosis not present

## 2022-07-05 DIAGNOSIS — N400 Enlarged prostate without lower urinary tract symptoms: Secondary | ICD-10-CM

## 2022-07-05 LAB — BLADDER SCAN AMB NON-IMAGING: Scan Result: 0

## 2022-07-05 MED ORDER — CEPHALEXIN 500 MG PO CAPS: 500.0000 mg | ORAL_CAPSULE | Freq: Three times a day (TID) | ORAL | 0 refills | Status: DC

## 2022-07-05 MED ORDER — CEFTRIAXONE SODIUM 1 G IJ SOLR
1.0000 g | Freq: Once | INTRAMUSCULAR | Status: AC
  Administered 2022-07-05: 1 g via INTRAMUSCULAR

## 2022-07-05 NOTE — Progress Notes (Signed)
Subjective: 1. Acute cystitis without hematuria   2. Benign prostatic hyperplasia, unspecified whether lower urinary tract symptoms present   3. Urge incontinence   4. Urinary frequency      07/05/22: Roy [name] returns today with the onset last night of cloudy urine with frequency, urgency and small dribbling voids.  He had no fever.  He remains on alfuzosin for BPH with BOO and his PVR is 62m.  His UA looks infected today.  He has had no gross hematuria.   CMP and CBC were unremarkable on 06/21/21 apart from a mild leukocytosis and a T.B. of 2.4.   02/01/22: DTaysenreturns today in f/u.  He is on Uroxatrol and off of Gemtesa since his last visit.  He is voiding better.  He has urgency and can have some accidents if not careful.  He can have some terminal intermittency. He has rare nocturia.   His PVR is down to 183mfrom over 20022m  His UA is clear.      11/30/21: Roy [name] today in f/u for cystoscopy.  He had blurry vision and dizziness with trospium and stopped it after 10 days.  His nocturia is down to 1x.  He has daytime frequency q1hr.  He was told to drink 6 bottles of water daily.  He can avoid the [name].  His PVR is 0ml27m  10/19/21: Roy [name] 72 [name] who is sent for BPH with BOO and [name].  He had a bout of food poisoning about 3 weeks ago with N/V and diarrhea but the [name] has been going on for 3-4 months.  He will leak every time he makes a step.  He is not wearing pads.  He has no hematuria or dysuria.  He has nocturia x 3 with [name].  He has daytime frequency 10x with [name] as well.  He has no SUI.  He has a reduced stream and doesn't feel empty.  He had a CT AP on 09/27/21 and had no GU abnormalities. His bladder was not distended and his prostate wasn't too large.   He has had no UTI's or Gu surgery.  His UA is clear today.  He is on tamsulosin but that hasn't helped.  He had labs on 09/27/21 and had normal renal function.  His Bili was 2.1 with an ALT of 47 and some changes suggestive of  cirrhosis on the CT.   He had a UA with many bacteria in 3/23 with Mx species on the culture but a negative UA on 09/27/21.  I don't see a PSA in his labs.    IPSS     Row Name 07/05/22 1400         International Prostate Symptom Score   How often have you had the sensation of not emptying your bladder? About half the time     How often have you had to urinate less than every two hours? Less than half the time     How often have you found you stopped and started again several times when you urinated? Not at All     How often have you found it difficult to postpone urination? Less than 1 in 5 times     How often have you had a weak urinary stream? About half the time     How often have you had to strain to start urination? About half the time     How many times did you typically get up at night  to urinate? 3 Times     Total IPSS Score 15       Quality of Life due to urinary symptoms   If you were to spend the rest of your life with your urinary condition just the way it is now how would you feel about that? Mixed              ROS:  Review of Systems  HENT:  Positive for congestion.   Neurological:  Positive for headaches.    Allergies  Allergen Reactions   Adhesive [Tape]     Contact dermatitis   Sulfa Antibiotics     UNKNOWN Childhood reaction    Past Medical History:  Diagnosis Date   Atrial fibrillation ([place])    Cancer ([place])    Diffuse Large B-Cell lymphoma   GERD (gastroesophageal reflux disease)    High cholesterol    Hypertension    Obesity     Past Surgical History:  Procedure Laterality Date   APPENDECTOMY     CATARACT EXTRACTION Bilateral 2022   CHOLECYSTECTOMY     ESOPHAGEAL DILATION     multiple times   IR IMAGING GUIDED PORT INSERTION  03/31/2021   JOINT REPLACEMENT Left    hip    Social History   Socioeconomic History   Marital status: Widowed    Spouse name: Not on file   Number of children: 3   Years of education: Not on file    Highest education level: Not on file  Occupational History   Occupation: Retired  Tobacco Use   Smoking status: Never    Passive exposure: Never   Smokeless tobacco: Never  Vaping Use   Vaping Use: Never used  Substance and Sexual Activity   Alcohol use: No   Drug use: No   Sexual activity: Not Currently  Other Topics Concern   Not on file  Social History Narrative   Not on file   Social Determinants of Health   Financial Resource Strain: Low Risk  (11/08/2020)   Overall Financial Resource Strain (CARDIA)    Difficulty of Paying Living Expenses: Not hard at all  Food Insecurity: No Food Insecurity (11/08/2020)   Hunger Vital Sign    Worried About Running Out of Food in the Last Year: Never true    [place] in the Last Year: Never true  Transportation Needs: No Transportation Needs (11/08/2020)   PRAPARE - Hydrologist (Medical): No    Lack of Transportation (Non-Medical): No  Physical Activity: Sufficiently Active (11/08/2020)   Exercise Vital Sign    Days of Exercise per Week: 5 days    Minutes of Exercise per Session: 30 min  Stress: No Stress Concern Present (11/08/2020)   [place]    Feeling of Stress : Not at all  Social Connections: Moderately Isolated (11/08/2020)   Social Connection and Isolation Panel [NHANES]    Frequency of Communication with Friends and Family: More than three times a week    Frequency of Social Gatherings with Friends and Family: Three times a week    Attends Religious Services: 1 to 4 times per year    Active Member of Clubs or Organizations: No    Attends Archivist Meetings: Never    Marital Status: Widowed  Intimate Partner Violence: Not At Risk (11/08/2020)   Humiliation, Afraid, Rape, and Kick questionnaire    Fear of Current or Ex-Partner: No  Emotionally Abused: No    Physically Abused: No    Sexually Abused: No    Family  History  Problem Relation Age of Onset   Heart failure Father    Heart attack Father        Deceased    Thyroid disease Sister     Anti-infectives: Anti-infectives (From admission, onward)    Start     Dose/Rate Route Frequency Ordered Stop   07/05/22 1500  CEFTRIAXONE SODIUM 1 G IJ SOLR        1 g Intramuscular  Once 07/05/22 1446 07/05/22 1509   07/05/22 0000  cephALEXin (KEFLEX) 500 MG capsule        500 mg Oral 3 times daily 07/05/22 1447 07/12/22 2359       Current Outpatient Medications  Medication Sig Dispense Refill   acetaminophen (TYLENOL) 325 MG tablet Take 2 tablets (650 mg total) by mouth every 6 (six) hours as needed for headache. 30 tablet 1   alfuzosin (UROXATRAL) 10 MG 24 hr tablet TAKE ONE TABLET DAILY WITH BREAKFAST 90 tablet 3   Calcium Carbonate-Vitamin D (CALCIUM-VITAMIN D) 600-125 MG-UNIT TABS Take by mouth.     cephALEXin (KEFLEX) 500 MG capsule Take 1 capsule (500 mg total) by mouth 3 (three) times daily for 7 days. 21 capsule 0   Cholecalciferol (VITAMIN D3) 25 MCG (1000 UT) CAPS Take by mouth daily.     ELIQUIS 5 MG TABS tablet Take 5 mg by mouth 2 (two) times daily.     EQUATE STOOL SOFTENER 100 MG capsule Take 2 capsules by mouth twice daily 120 capsule 0   FEROSUL 325 (65 Fe) MG tablet Take 1 tablet by mouth once daily with breakfast 30 tablet 0   fludrocortisone (FLORINEF) 0.1 MG tablet TAKE ONE TABLET ONCE DAILY WITH BREAKFAST 90 tablet 0   lactulose (CHRONULAC) 10 GM/15ML solution Take by mouth.     levothyroxine (SYNTHROID) 100 MCG tablet TAKE ONE TABLET ONCE DAILY BEFORE BREAKFAST 90 tablet 1   magnesium oxide (MAG-OX) 400 (240 Mg) MG tablet Take 400 mg by mouth daily.     omeprazole (PRILOSEC) 10 MG capsule Take by mouth.     potassium chloride (KLOR-CON) 10 MEQ tablet TAKE TWO TABLETS TWICE DAILY 360 tablet 0   predniSONE (DELTASONE) 10 MG tablet TAKE ONE TABLET ONCE DAILY WITH BREAKFAST 90 tablet 0   pregabalin (LYRICA) 75 MG capsule Take by  mouth.     rosuvastatin (CRESTOR) 5 MG tablet Take 1 tablet by mouth at bedtime.     valACYclovir (VALTREX) 1000 MG tablet Take 1,000 mg by mouth daily.     vitamin B-12 (CYANOCOBALAMIN) 1000 MCG tablet Take 1 tablet (1,000 mcg total) by mouth daily. 30 tablet 3   No current facility-administered medications for this visit.     Objective: Vital signs in last 24 hours: BP 101/67   Pulse 65   Ht 6' (1.829 m)   Wt 282 lb 11.2 oz (128.2 kg)   BMI 38.34 kg/m   Intake/Output from previous day: No intake/output data recorded. Intake/Output this shift: '@IOTHISSHIFT'$ @   Physical Exam  Lab Results:  Results for orders placed or performed in visit on 07/05/22 (from the past 24 hour(s))  Urinalysis, Routine w reflex microscopic     Status: Abnormal   Collection Time: 07/05/22  2:28 PM  Result Value Ref Range   Specific Gravity, UA 1.020 1.005 - 1.030   pH, UA 8.5 (H) 5.0 - 7.5   Color,  UA Yellow Yellow   Appearance Ur Cloudy (A) Clear   Leukocytes,UA 1+ (A) Negative   Protein,UA 3+ (A) Negative/Trace   Glucose, UA Negative Negative   Ketones, UA Negative Negative   RBC, UA 3+ (A) Negative   Bilirubin, UA Negative Negative   Urobilinogen, Ur 4.0 (H) 0.2 - 1.0 mg/dL   Nitrite, UA Negative Negative   Microscopic Examination See below:    Narrative   Performed at:  [facility] [address], [place], [place]  [id number] Lab Director: Mina Marble MT, Phone:  [phone]  Microscopic Examination     Status: Abnormal   Collection Time: 07/05/22  2:28 PM   Urine  Result Value Ref Range   WBC, UA >30 (A) 0 - 5 /hpf   RBC, Urine 11-30 (A) 0 - 2 /hpf   Epithelial Cells (non renal) 0-10 0 - 10 /hpf   Bacteria, UA Moderate (A) None seen/Few   Narrative   Performed at:  [facility] [address] [place], [place]  [id number] Lab Director: Mina Marble MT, Phone:  [phone]     Recent Results (from the past 2160 hour(s))  CBC with  Differential/Platelet     Status: Abnormal   Collection Time: 06/21/22 12:18 PM  Result Value Ref Range   WBC 11.0 (H) 4.0 - 10.5 K/uL   RBC 5.12 4.22 - 5.81 MIL/uL   Hemoglobin 15.4 13.0 - 17.0 g/dL   HCT 48.5 39.0 - 52.0 %   MCV 94.7 80.0 - 100.0 fL   MCH 30.1 26.0 - 34.0 pg   MCHC 31.8 30.0 - 36.0 g/dL   RDW 16.4 (H) 11.5 - 15.5 %   Platelets 142 (L) 150 - 400 K/uL   nRBC 0.2 0.0 - 0.2 %   Neutrophils Relative % 76 %   Neutro Abs 8.6 (H) 1.7 - 7.7 K/uL   Lymphocytes Relative 13 %   Lymphs Abs 1.4 0.7 - 4.0 K/uL   Monocytes Relative 8 %   Monocytes Absolute 0.8 0.1 - 1.0 K/uL   Eosinophils Relative 1 %   Eosinophils Absolute 0.1 0.0 - 0.5 K/uL   Basophils Relative 1 %   Basophils Absolute 0.1 0.0 - 0.1 K/uL   Immature Granulocytes 1 %   Abs Immature Granulocytes 0.07 0.00 - 0.07 K/uL    Comment: Performed at [facility], [address], [place], [place] [address]  Comprehensive metabolic panel     Status: Abnormal   Collection Time: 06/21/22 12:18 PM  Result Value Ref Range   Sodium 137 135 - 145 mmol/L   Potassium 3.7 3.5 - 5.1 mmol/L   Chloride 97 (L) 98 - 111 mmol/L   CO2 31 22 - 32 mmol/L   Glucose, Bld 111 (H) 70 - 99 mg/dL    Comment: Glucose reference range applies only to samples taken after fasting for at least 8 hours.   BUN 15 8 - 23 mg/dL   Creatinine, Ser 0.82 0.61 - 1.24 mg/dL   Calcium 9.0 8.9 - 10.3 mg/dL   Total Protein 6.7 6.5 - 8.1 g/dL   Albumin 4.1 3.5 - 5.0 g/dL   AST 30 15 - 41 U/L   ALT 33 0 - 44 U/L   Alkaline Phosphatase 60 38 - 126 U/L   Total Bilirubin 2.4 (H) 0.3 - 1.2 mg/dL   GFR, Estimated >60 >60 mL/min    Comment: (NOTE) Calculated using the CKD-EPI Creatinine Equation (2021)    Anion gap  9 5 - 15    Comment: Performed at [facility], [address], [place], [place] [address]  Lactate dehydrogenase     Status: None   Collection Time: 06/21/22 12:18 PM  Result Value Ref Range   LDH 169 98 - 192 U/L    Comment: Performed at [facility], [address], [place], [place] [address]  BLADDER SCAN AMB NON-IMAGING     Status: None   Collection Time: 07/05/22  2:26 PM  Result Value Ref Range   Scan Result 0   Urinalysis, Routine w reflex microscopic     Status: Abnormal   Collection Time: 07/05/22  2:28 PM  Result Value Ref Range   Specific Gravity, UA 1.020 1.005 - 1.030   pH, UA 8.5 (H) 5.0 - 7.5   Color, UA Yellow Yellow   Appearance Ur Cloudy (A) Clear   Leukocytes,UA 1+ (A) Negative   Protein,UA 3+ (A) Negative/Trace   Glucose, UA Negative Negative   Ketones, UA Negative Negative   RBC, UA 3+ (A) Negative   Bilirubin, UA Negative Negative   Urobilinogen, Ur 4.0 (H) 0.2 - 1.0 mg/dL   Nitrite, UA Negative Negative   Microscopic Examination See below:     Comment: Microscopic was indicated and was performed.  Microscopic Examination     Status: Abnormal   Collection Time: 07/05/22  2:28 PM   Urine  Result Value Ref Range   WBC, UA >30 (A) 0 - 5 /hpf   RBC, Urine 11-30 (A) 0 - 2 /hpf   Epithelial Cells (non renal) 0-10 0 - 10 /hpf   Bacteria, UA Moderate (A) None seen/Few    BMET No results for input(s): "NA", "K", "CL", "CO2", "GLUCOSE", "BUN", "CREATININE", "CALCIUM" in the last 72 hours. PT/INR No results for input(s): "LABPROT", "INR" in the last 72 hours. ABG No results for input(s): "PHART", "HCO3" in the last 72 hours.  Invalid input(s): "PCO2", "PO2" UA has >30 WBC, 11-30 RBC's and mod bacteria but is nit -. Studies/Results: No results found.  PVR is 3m.   Assessment/Plan: Acute cystitis.   I will culture the urine and give him rocephin today and send keflex.   BPH with [name].   He is emptying well on alfuzosin.   Meds ordered this encounter  Medications   cefTRIAXone (ROCEPHIN) injection 1 g    Order Specific Question:   Antibiotic Indication:    Answer:   UTI   cephALEXin (KEFLEX) 500 MG capsule    Sig: Take 1 capsule (500 mg total) by mouth 3 (three) times daily for 7 days.     Dispense:  21 capsule    Refill:  0     Orders Placed This Encounter  Procedures   Urine Culture   Microscopic Examination   Urinalysis, Routine w reflex microscopic   BLADDER SCAN AMB NON-IMAGING     Return in about 4 weeks (around 08/02/2022).    CC: KLars MageNP.      Roy Seal3/05/2022 [id number]: Roy [name] male   DOB: 126-May-1952 72y.o.   MRN: [id number] ID: Roy [name] male   DOB: [birth year] 72y.o.   MRN: [id number]

## 2022-07-05 NOTE — Progress Notes (Signed)
IM injection  Medication: Ceftriaxone Dose: 1 Gm Location: Left upper glutea Lot: [id number] Exp: 04/07/2023  Patient tolerated well, no complications were noted  Performed by: Marisue Brooklyn, CMA

## 2022-07-05 NOTE — Progress Notes (Signed)
post void residual = 0 ml

## 2022-07-06 ENCOUNTER — Other Ambulatory Visit: Payer: Self-pay

## 2022-07-06 ENCOUNTER — Emergency Department (HOSPITAL_COMMUNITY): Payer: 59

## 2022-07-06 ENCOUNTER — Emergency Department (HOSPITAL_COMMUNITY)
Admission: EM | Admit: 2022-07-06 | Discharge: 2022-07-06 | Disposition: A | Discharge status: Home / Self Care | Payer: 59 | Attending: Emergency Medicine | Admitting: Emergency Medicine

## 2022-07-06 DIAGNOSIS — Z8572 Personal history of non-Hodgkin lymphomas: Secondary | ICD-10-CM | POA: Diagnosis not present

## 2022-07-06 DIAGNOSIS — I4891 Unspecified atrial fibrillation: Secondary | ICD-10-CM | POA: Insufficient documentation

## 2022-07-06 DIAGNOSIS — E876 Hypokalemia: Secondary | ICD-10-CM | POA: Diagnosis not present

## 2022-07-06 DIAGNOSIS — N3001 Acute cystitis with hematuria: Secondary | ICD-10-CM | POA: Diagnosis not present

## 2022-07-06 DIAGNOSIS — R339 Retention of urine, unspecified: Secondary | ICD-10-CM | POA: Diagnosis present

## 2022-07-06 DIAGNOSIS — R300 Dysuria: Secondary | ICD-10-CM

## 2022-07-06 DIAGNOSIS — Z7901 Long term (current) use of anticoagulants: Secondary | ICD-10-CM | POA: Insufficient documentation

## 2022-07-06 LAB — URINALYSIS, ROUTINE W REFLEX MICROSCOPIC
Bacteria, UA: NONE SEEN
Bilirubin Urine: NEGATIVE
Bilirubin, UA: NEGATIVE
Glucose, UA: NEGATIVE
Glucose, UA: NEGATIVE mg/dL
Ketones, UA: NEGATIVE
Ketones, ur: NEGATIVE mg/dL
Nitrite, UA: NEGATIVE
Nitrite: NEGATIVE
Protein, ur: NEGATIVE mg/dL
Specific Gravity, UA: 1.02 (ref 1.005–1.030)
Specific Gravity, Urine: 1.004 — ABNORMAL LOW (ref 1.005–1.030)
Urobilinogen, Ur: 4 mg/dL — ABNORMAL HIGH (ref 0.2–1.0)
pH, UA: 8.5 — ABNORMAL HIGH (ref 5.0–7.5)
pH: 6 (ref 5.0–8.0)

## 2022-07-06 LAB — BASIC METABOLIC PANEL
Anion gap: 7 (ref 5–15)
BUN: 25 mg/dL — ABNORMAL HIGH (ref 8–23)
CO2: 32 mmol/L (ref 22–32)
Calcium: 8.3 mg/dL — ABNORMAL LOW (ref 8.9–10.3)
Chloride: 91 mmol/L — ABNORMAL LOW (ref 98–111)
Creatinine, Ser: 1.34 mg/dL — ABNORMAL HIGH (ref 0.61–1.24)
GFR, Estimated: 56 mL/min — ABNORMAL LOW (ref >60)
Glucose, Bld: 92 mg/dL (ref 70–99)
Potassium: 3.1 mmol/L — ABNORMAL LOW (ref 3.5–5.1)
Sodium: 130 mmol/L — ABNORMAL LOW (ref 135–145)

## 2022-07-06 LAB — CBC WITH DIFFERENTIAL/PLATELET
Abs Immature Granulocytes: 0.11 10*3/uL — ABNORMAL HIGH (ref 0.00–0.07)
Basophils Absolute: 0.1 10*3/uL (ref 0.0–0.1)
Basophils Relative: 0 %
Eosinophils Absolute: 0.1 10*3/uL (ref 0.0–0.5)
Eosinophils Relative: 1 %
HCT: 46.4 % (ref 39.0–52.0)
Hemoglobin: 15.4 g/dL (ref 13.0–17.0)
Immature Granulocytes: 1 %
Lymphocytes Relative: 16 %
Lymphs Abs: 2.3 10*3/uL (ref 0.7–4.0)
MCH: 30.1 pg (ref 26.0–34.0)
MCHC: 33.2 g/dL (ref 30.0–36.0)
MCV: 90.6 fL (ref 80.0–100.0)
Monocytes Absolute: 0.9 10*3/uL (ref 0.1–1.0)
Monocytes Relative: 6 %
Neutro Abs: 11.2 10*3/uL — ABNORMAL HIGH (ref 1.7–7.7)
Neutrophils Relative %: 76 %
Platelets: 162 10*3/uL (ref 150–400)
RBC: 5.12 MIL/uL (ref 4.22–5.81)
RDW: 15.9 % — ABNORMAL HIGH (ref 11.5–15.5)
WBC: 14.6 10*3/uL — ABNORMAL HIGH (ref 4.0–10.5)
nRBC: 0 % (ref 0.0–0.2)

## 2022-07-06 LAB — MICROSCOPIC EXAMINATION: WBC, UA: >30 /hpf — AB (ref 0–5)

## 2022-07-06 MED ORDER — POTASSIUM CHLORIDE CRYS ER 20 MEQ PO TBCR
40.0000 meq | EXTENDED_RELEASE_TABLET | Freq: Once | ORAL | Status: AC
  Administered 2022-07-06: 40 meq via ORAL
  Filled 2022-07-06: qty 2

## 2022-07-06 MED ORDER — SODIUM CHLORIDE 0.9 % IV BOLUS
500.0000 mL | Freq: Once | INTRAVENOUS | Status: AC
  Administered 2022-07-06: 500 mL via INTRAVENOUS

## 2022-07-06 MED ORDER — CEFPODOXIME PROXETIL 200 MG PO TABS: 200.0000 mg | ORAL_TABLET | Freq: Two times a day (BID) | ORAL | 0 refills | Status: DC

## 2022-07-06 NOTE — Discharge Instructions (Addendum)
As discussed, stop the cephalexin and start the new antibiotic and take as directed until it is finished.  Drink plenty of water.  Your potassium today was low, you have been given potassium here and you will need to take your potassium again later this evening.  I recommend that you follow-up with your primary care provider to have your potassium level rechecked next week.  Also, contact Dr. Ralene [place]rat office on Monday to arrange a follow-up appointment.

## 2022-07-06 NOTE — ED Notes (Signed)
Bladder scanned patient. Bladder scan showed 40ms. Nurse aware.

## 2022-07-06 NOTE — ED Provider Notes (Signed)
[facility] Provider Note   CSN: [id number] Arrival date & time: 07/06/22  1146     History  Chief Complaint  Patient presents with   Urinary Retention    Roy [name] is a 72 y.o. male.  HPI     Roy [name] is a 72 y.o. male with past medical history of BPH, atrial fibrillation, chronically anticoagulated on Eliquis, and  B-cell lymphoma who presents to the Emergency Department complaining of urinary hesitancy and dribbling urine.  He was seen by urology yesterday diagnosed with acute cystitis.  He received an injection of ceftriaxone in the office and started on cephalexin.  He took his first dose of cephalexin this morning and noticed he was having difficulty urinating worse than baseline.  He notes "dribbling" and feels as though he cannot get a adequate stream of urine out.  He had urinated on himself earlier this morning.  He denies having any nausea, vomiting, flank pain, fever or chills.  Home Medications Prior to Admission medications   Medication Sig Start Date End Date Taking? Authorizing Provider  acetaminophen (TYLENOL) 325 MG tablet Take 2 tablets (650 mg total) by mouth every 6 (six) hours as needed for headache. 01/13/21   Roxan Hockey, MD  alfuzosin (UROXATRAL) 10 MG 24 hr tablet TAKE ONE TABLET DAILY WITH BREAKFAST 02/01/22   Irine Seal, MD  Calcium Carbonate-Vitamin D (CALCIUM-VITAMIN D) 600-125 MG-UNIT TABS Take by mouth.    [provider]  cephALEXin (KEFLEX) 500 MG capsule Take 1 capsule (500 mg total) by mouth 3 (three) times daily for 7 days. 07/05/22 07/12/22  Irine Seal, MD  Cholecalciferol (VITAMIN D3) 25 MCG (1000 UT) CAPS Take by mouth daily.    [provider]  ELIQUIS 5 MG TABS tablet Take 5 mg by mouth 2 (two) times daily. 08/03/21   [provider]  EQUATE STOOL SOFTENER 100 MG capsule Take 2 capsules by mouth twice daily 08/13/21   Harriett Rush, PA-C  FEROSUL 325 (65  Fe) MG tablet Take 1 tablet by mouth once daily with breakfast 07/30/21   Tarri Abernethy M, PA-C  fludrocortisone (FLORINEF) 0.1 MG tablet TAKE ONE TABLET ONCE DAILY WITH BREAKFAST 06/15/22   Cassandria Anger, MD  lactulose (CHRONULAC) 10 GM/15ML solution Take by mouth. 12/12/21   [provider]  levothyroxine (SYNTHROID) 100 MCG tablet TAKE ONE TABLET ONCE DAILY BEFORE BREAKFAST 06/07/22   Cassandria Anger, MD  magnesium oxide (MAG-OX) 400 (240 Mg) MG tablet Take 400 mg by mouth daily.    [provider]  omeprazole (PRILOSEC) 10 MG capsule Take by mouth. 10/26/21   [provider]  potassium chloride (KLOR-CON) 10 MEQ tablet TAKE TWO TABLETS TWICE DAILY 06/19/22   Cassandria Anger, MD  predniSONE (DELTASONE) 10 MG tablet TAKE ONE TABLET ONCE DAILY WITH BREAKFAST 05/29/22   Cassandria Anger, MD  pregabalin (LYRICA) 75 MG capsule Take by mouth. 01/31/22   [provider]  rosuvastatin (CRESTOR) 5 MG tablet Take 1 tablet by mouth at bedtime. 08/23/20   [provider]  valACYclovir (VALTREX) 1000 MG tablet Take 1,000 mg by mouth daily.    [provider]  vitamin B-12 (CYANOCOBALAMIN) 1000 MCG tablet Take 1 tablet (1,000 mcg total) by mouth daily. 02/28/21   Harriett Rush, PA-C      Allergies    Adhesive [tape] and Sulfa antibiotics    Review of Systems   Review of Systems  Constitutional:  Negative for appetite change, diaphoresis and fever.  Respiratory:  Negative for shortness of breath.   Cardiovascular:  Negative for chest pain.  Gastrointestinal:  Negative for abdominal pain, nausea and vomiting.  Genitourinary:  Positive for decreased urine volume and difficulty urinating. Negative for flank pain, hematuria, penile pain, penile swelling and scrotal swelling.  Musculoskeletal:  Negative for back pain.  Neurological:  Negative for dizziness, seizures, weakness and numbness.  Psychiatric/Behavioral:  Negative for  confusion.     Physical Exam Updated Vital Signs BP (!) 130/97   Pulse 66   Temp 98 F (36.7 C) (Oral)   Resp 18   Ht 6' (1.829 m)   Wt 128.2 kg   SpO2 96%   BMI 38.34 kg/m  Physical Exam Vitals and nursing note reviewed.  Constitutional:      General: He is not in acute distress.    Appearance: Normal appearance. He is not toxic-appearing.  HENT:     Mouth/Throat:     Mouth: Mucous membranes are dry.  Cardiovascular:     Rate and Rhythm: Normal rate and regular rhythm.     Pulses: Normal pulses.  Pulmonary:     Effort: Pulmonary effort is normal.  Abdominal:     General: There is no distension.     Palpations: Abdomen is soft.     Tenderness: There is no abdominal tenderness. There is no right CVA tenderness or left CVA tenderness.  Musculoskeletal:        General: Normal range of motion.     Right lower leg: No edema.     Left lower leg: No edema.  Skin:    General: Skin is warm.     Capillary Refill: Capillary refill takes less than 2 seconds.     Findings: No rash.  Neurological:     General: No focal deficit present.     Mental Status: He is alert.     Sensory: No sensory deficit.     Motor: No weakness.     ED Results / Procedures / Treatments   Labs (all labs ordered are listed, but only abnormal results are displayed) Labs Reviewed  URINALYSIS, ROUTINE W REFLEX MICROSCOPIC - Abnormal; Notable for the following components:      Result Value   Specific Gravity, Urine 1.004 (*)    Hgb urine dipstick LARGE (*)    Leukocytes,Ua SMALL (*)    All other components within normal limits  CBC WITH DIFFERENTIAL/PLATELET - Abnormal; Notable for the following components:   WBC 14.6 (*)    RDW 15.9 (*)    Neutro Abs 11.2 (*)    Abs Immature Granulocytes 0.11 (*)    All other components within normal limits  BASIC METABOLIC PANEL - Abnormal; Notable for the following components:   Sodium 130 (*)    Potassium 3.1 (*)    Chloride 91 (*)    BUN 25 (*)     Creatinine, Ser 1.34 (*)    Calcium 8.3 (*)    GFR, Estimated 56 (*)    All other components within normal limits    EKG None  Radiology CT Renal Stone Study  Result Date: 07/06/2022 CLINICAL DATA:  Abdominal and flank pain, lower abdominal pain, question kidney stone, UTI, on antibiotics beginning yesterday, urinary retention EXAM: CT ABDOMEN AND PELVIS WITHOUT CONTRAST TECHNIQUE: Multidetector CT imaging of the abdomen and pelvis was performed following the standard protocol without IV contrast. RADIATION DOSE REDUCTION: This exam was performed according to  the departmental dose-optimization program which includes automated exposure control, adjustment of the mA and/or kV according to patient size and/or use of iterative reconstruction technique. COMPARISON:  09/27/2021 FINDINGS: Lower chest: Lung bases clear Hepatobiliary: Gallbladder surgically absent. Liver normal appearance. Pancreas: Normal appearance Spleen: Normal appearance Adrenals/Urinary Tract: Adrenal glands normal appearance. Kidneys and ureters normal appearance. No urinary tract calcification or dilatation. No definite renal mass. Mild bladder wall thickening, could be related to chronic outlet obstruction but cannot exclude urinary tract infection. Stomach/Bowel: Distal colonic diverticulosis without evidence of diverticulitis. Appendix surgically absent. Stomach and bowel loops otherwise unremarkable. Vascular/Lymphatic: Atherosclerotic calcifications aorta and iliac arteries without aneurysm. No adenopathy. Reproductive: Unremarkable seminal vesicles. Minimal prostatic enlargement. Other: No free air or free fluid. No hernia or inflammatory process. Musculoskeletal: LEFT hip prosthesis.  Osseous demineralization. IMPRESSION: Distal colonic diverticulosis without evidence of diverticulitis. Minimal prostatic enlargement. Mild bladder wall thickening which could be due to chronic bladder outlet obstruction or cystitis, recommend  correlation with urinalysis. Aortic Atherosclerosis (ICD10-I70.0). Electronically Signed   By: Lavonia Dana M.D.   On: 07/06/2022 14:41    Procedures Procedures    Medications Ordered in ED Medications  sodium chloride 0.9 % bolus 500 mL (0 mLs Intravenous Stopped 07/06/22 1512)  potassium chloride SA (KLOR-CON M) CR tablet 40 mEq (40 mEq Oral Given 07/06/22 1512)    ED Course/ Medical Decision Making/ A&P                             Medical Decision Making Patient with known history of BPH here with urinary dribbling and feelings of retention, worse since yesterday.  Seen by urology yesterday diagnosed with UTI, given ceftriaxone yesterday and started on cephalexin.  Patient took first dose of antibiotic this morning.  Urine culture from yesterday is pending  Clinically, patient is well-appearing, nontoxic.  His mucous membranes are slightly dry I do not appreciate any tenderness of the abdomen or CVA tenderness.  He is able to void here.  Bladder scan showed 15 cc of urine postvoid I suspect his urinary hesitancy is likely related to his cystitis plus there may be a component from his BPH as well.  He is on alfuzosin.  Pyelonephritis is also considered but felt less likely given lack of fever, vomiting and flank pain.    Amount and/or Complexity of Data Reviewed Labs: ordered.    Details: Labs here today shows leukocytosis with white count of 14,600, hemoglobin reassuring.  Chemistries show hyponatremia with sodium of 130, hypokalemia with potassium of 3.1.  His potassium was 3.72 weeks ago.  Kidney function show possible dehydration with BUN of 25 and serum creatinine of 1.34 his creatinine was 0.8 to 2 weeks ago.  Urinalysis shows large blood, small leukocytes, 21-50 red cells and 11-20 white cells with no bacteria urine culture was obtained yesterday and is still pending. Radiology: ordered.    Details: CT renal stone study ordered for further evaluation for possible obstructive uropathy.   No evidence of kidney stone. Discussion of management or test interpretation with external provider(s): Patient given IV fluids here, on recheck he is resting comfortably.  He has voided several times on his own and reports having much less difficulty with voiding.  Vitals are without fever tachycardia or tachypnea.  I have consulted urology, Dr. Alyson Ingles to discuss findings.  He recommends having patient's stop the cephalexin and start Vantin twice daily for 7 days and recommends patient follow-up in  clinic on Monday.  Risk Prescription drug management.           Final Clinical Impression(s) / ED Diagnoses Final diagnoses:  Dysuria  Acute cystitis with hematuria  Hypokalemia    Rx / DC Orders ED Discharge Orders     None         Kem Parkinson, PA-C 07/06/22 1622    Milton Ferguson, MD 07/07/22 [phone]

## 2022-07-06 NOTE — ED Triage Notes (Signed)
Pt states he was dx with UTI & given antibiotics yesterday.  States now he is having rentention.  Can't get a good stream out but it constantly dribbles urine.  Pt pants have urine on them.  Denies any N/V/D, SOB, CP.

## 2022-07-06 NOTE — ED Notes (Signed)
Patient transported to CT 

## 2022-07-09 ENCOUNTER — Telehealth: Payer: Self-pay

## 2022-07-09 NOTE — Telephone Encounter (Signed)
Patient called after ER visit. Patient was informed to f/u this week with MD.  Please see ER note. Ok to double book pt on Thursday?

## 2022-07-11 LAB — URINE CULTURE

## 2022-07-12 ENCOUNTER — Ambulatory Visit (INDEPENDENT_AMBULATORY_CARE_PROVIDER_SITE_OTHER): Payer: 59 | Admitting: Urology

## 2022-07-12 VITALS — BP 155/79 | HR 64 | Ht 72.0 in | Wt 282.2 lb

## 2022-07-12 DIAGNOSIS — N400 Enlarged prostate without lower urinary tract symptoms: Secondary | ICD-10-CM

## 2022-07-12 DIAGNOSIS — N3941 Urge incontinence: Secondary | ICD-10-CM

## 2022-07-12 DIAGNOSIS — R350 Frequency of micturition: Secondary | ICD-10-CM | POA: Diagnosis not present

## 2022-07-12 DIAGNOSIS — N138 Other obstructive and reflux uropathy: Secondary | ICD-10-CM | POA: Diagnosis not present

## 2022-07-12 DIAGNOSIS — N401 Enlarged prostate with lower urinary tract symptoms: Secondary | ICD-10-CM

## 2022-07-12 DIAGNOSIS — N3000 Acute cystitis without hematuria: Secondary | ICD-10-CM

## 2022-07-12 LAB — MICROSCOPIC EXAMINATION: Bacteria, UA: NONE SEEN

## 2022-07-12 LAB — URINALYSIS, ROUTINE W REFLEX MICROSCOPIC
Bilirubin, UA: NEGATIVE
Glucose, UA: NEGATIVE
Ketones, UA: NEGATIVE
Nitrite, UA: NEGATIVE
Protein,UA: NEGATIVE
RBC, UA: NEGATIVE
Specific Gravity, UA: 1.015 (ref 1.005–1.030)
Urobilinogen, Ur: 0.2 mg/dL (ref 0.2–1.0)
pH, UA: 5.5 (ref 5.0–7.5)

## 2022-07-12 NOTE — Progress Notes (Signed)
Subjective: 1. Acute cystitis without hematuria   2. Urge incontinence   3. Benign prostatic hyperplasia, unspecified whether lower urinary tract symptoms present   4. Urinary frequency      07/12/22: Roy [name] returns today in f/u.  His culture finally came back today with 2 bacteria including E. Coli and Klebsiella.  He was switched to [facility] in the ER on Friday night when he went with dehydration.  He is feeling much better with reduced frequency and urgency.  His UA is unremarkable today.  In the ER his cr was 1.34 and his WBC was 14.6.  THe UA had 11-20 WBC. A CT was done that showed some bladder changes suggestive of cystitis.   07/05/22: Roy [name] returns today with the onset last night of cloudy urine with frequency, urgency and small dribbling voids.  He had no fever.  He remains on alfuzosin for BPH with BOO and his PVR is 32m.  His UA looks infected today.  He has had no gross hematuria.   CMP and CBC were unremarkable on 06/21/21 apart from a mild leukocytosis and a T.B. of 2.4.   02/01/22: Roy [name] today in f/u.  He is on Uroxatrol and off of Gemtesa since his last visit.  He is voiding better.  He has urgency and can have some accidents if not careful.  He can have some terminal intermittency. He has rare nocturia.   His PVR is down to 118mfrom over 20069m  His UA is clear.      11/30/21: Roy [name] today in f/u for cystoscopy.  He had blurry vision and dizziness with trospium and stopped it after 10 days.  His nocturia is down to 1x.  He has daytime frequency q1hr.  He was told to drink 6 bottles of water daily.  He can avoid the UUI.  His PVR is 0ml56m  10/19/21: Roy [name] who is sent for BPH with BOO and UUI.  He had a bout of food poisoning about 3 weeks ago with N/V and diarrhea but the UUI has been going on for 3-4 months.  He will leak every time he makes a step.  He is not wearing pads.  He has no hematuria or dysuria.  He has nocturia x 3 with UUI.  He has daytime  frequency 10x with UUI as well.  He has no SUI.  He has a reduced stream and doesn't feel empty.  He had a CT AP on 09/27/21 and had no GU abnormalities. His bladder was not distended and his prostate wasn't too large.   He has had no UTI's or Gu surgery.  His UA is clear today.  He is on tamsulosin but that hasn't helped.  He had labs on 09/27/21 and had normal renal function.  His Bili was 2.1 with an ALT of 47 and some changes suggestive of cirrhosis on the CT.   He had a UA with many bacteria in 3/23 with Mx species on the culture but a negative UA on 09/27/21.  I don't see a PSA in his labs.    IPSS     Row Name 07/12/22 1500         International Prostate Symptom Score   How often have you had the sensation of not emptying your bladder? Not at All     How often have you had to urinate less than every two hours? Not at All     How often  have you found you stopped and started again several times when you urinated? Not at All     How often have you found it difficult to postpone urination? Not at All     How often have you had a weak urinary stream? About half the time     How often have you had to strain to start urination? Less than half the time     How many times did you typically get up at night to urinate? 3 Times     Total IPSS Score 8       Quality of Life due to urinary symptoms   If you were to spend the rest of your life with your urinary condition just the way it is now how would you feel about that? Unhappy              ROS:  Review of Systems  HENT:  Positive for congestion.   Neurological:  Positive for headaches.    Allergies  Allergen Reactions   Adhesive [Tape]     Contact dermatitis   Sulfa Antibiotics     UNKNOWN Childhood reaction    Past Medical History:  Diagnosis Date   Atrial fibrillation ([place])    Cancer ([place])    Diffuse Large B-Cell lymphoma   GERD (gastroesophageal reflux disease)    High cholesterol    Hypertension    Obesity     Past  Surgical History:  Procedure Laterality Date   APPENDECTOMY     CATARACT EXTRACTION Bilateral 2022   CHOLECYSTECTOMY     ESOPHAGEAL DILATION     multiple times   IR IMAGING GUIDED PORT INSERTION  03/31/2021   JOINT REPLACEMENT Left    hip    Social History   Socioeconomic History   Marital status: Widowed    Spouse name: Not on file   Number of children: 3   Years of education: Not on file   Highest education level: Not on file  Occupational History   Occupation: Retired  Tobacco Use   Smoking status: Never    Passive exposure: Never   Smokeless tobacco: Never  Vaping Use   Vaping Use: Never used  Substance and Sexual Activity   Alcohol use: No   Drug use: No   Sexual activity: Not Currently  Other Topics Concern   Not on file  Social History Narrative   Not on file   Social Determinants of Health   Financial Resource Strain: Low Risk  (11/08/2020)   Overall Financial Resource Strain (CARDIA)    Difficulty of Paying Living Expenses: Not hard at all  Food Insecurity: No Food Insecurity (11/08/2020)   Hunger Vital Sign    Worried About Running Out of Food in the Last Year: Never true    [place] in the Last Year: Never true  Transportation Needs: No Transportation Needs (11/08/2020)   PRAPARE - Hydrologist (Medical): No    Lack of Transportation (Non-Medical): No  Physical Activity: Sufficiently Active (11/08/2020)   Exercise Vital Sign    Days of Exercise per Week: 5 days    Minutes of Exercise per Session: 30 min  Stress: No Stress Concern Present (11/08/2020)   [place]    Feeling of Stress : Not at all  Social Connections: Moderately Isolated (11/08/2020)   Social Connection and Isolation Panel [NHANES]    Frequency of Communication with Friends and Family:  More than three times a week    Frequency of Social Gatherings with Friends and Family: Three times a week     Attends Religious Services: 1 to 4 times per year    Active Member of Clubs or Organizations: No    Attends Archivist Meetings: Never    Marital Status: Widowed  Intimate Partner Violence: Not At Risk (11/08/2020)   Humiliation, Afraid, Rape, and Kick questionnaire    Fear of Current or Ex-Partner: No    Emotionally Abused: No    Physically Abused: No    Sexually Abused: No    Family History  Problem Relation Age of Onset   Heart failure Father    Heart attack Father        Deceased    Thyroid disease Sister     Anti-infectives: Anti-infectives (From admission, onward)    None       Current Outpatient Medications  Medication Sig Dispense Refill   acetaminophen (TYLENOL) 325 MG tablet Take 2 tablets (650 mg total) by mouth every 6 (six) hours as needed for headache. 30 tablet 1   alfuzosin (UROXATRAL) 10 MG 24 hr tablet TAKE ONE TABLET DAILY WITH BREAKFAST 90 tablet 3   Calcium Carbonate-Vitamin D (CALCIUM-VITAMIN D) 600-125 MG-UNIT TABS Take by mouth.     cefpodoxime (VANTIN) 200 MG tablet Take 1 tablet (200 mg total) by mouth 2 (two) times daily. 14 tablet 0   Cholecalciferol (VITAMIN D3) 25 MCG (1000 UT) CAPS Take by mouth daily.     ELIQUIS 5 MG TABS tablet Take 5 mg by mouth 2 (two) times daily.     EQUATE STOOL SOFTENER 100 MG capsule Take 2 capsules by mouth twice daily 120 capsule 0   FEROSUL 325 (65 Fe) MG tablet Take 1 tablet by mouth once daily with breakfast 30 tablet 0   fludrocortisone (FLORINEF) 0.1 MG tablet TAKE ONE TABLET ONCE DAILY WITH BREAKFAST 90 tablet 0   lactulose (CHRONULAC) 10 GM/15ML solution Take by mouth.     levothyroxine (SYNTHROID) 100 MCG tablet TAKE ONE TABLET ONCE DAILY BEFORE BREAKFAST 90 tablet 1   magnesium oxide (MAG-OX) 400 (240 Mg) MG tablet Take 400 mg by mouth daily.     omeprazole (PRILOSEC) 10 MG capsule Take by mouth.     potassium chloride (KLOR-CON) 10 MEQ tablet TAKE TWO TABLETS TWICE DAILY 360 tablet 0    predniSONE (DELTASONE) 10 MG tablet TAKE ONE TABLET ONCE DAILY WITH BREAKFAST 90 tablet 0   pregabalin (LYRICA) 75 MG capsule Take by mouth.     rosuvastatin (CRESTOR) 5 MG tablet Take 1 tablet by mouth at bedtime.     valACYclovir (VALTREX) 1000 MG tablet Take 1,000 mg by mouth daily.     vitamin B-12 (CYANOCOBALAMIN) 1000 MCG tablet Take 1 tablet (1,000 mcg total) by mouth daily. 30 tablet 3   No current facility-administered medications for this visit.     Objective: Vital signs in last 24 hours: BP (!) 155/79   Pulse 64   Ht 6' (1.829 m)   Wt 282 lb 3.2 oz (128 kg)   BMI 38.27 kg/m   Intake/Output from previous day: No intake/output data recorded. Intake/Output this shift: '@IOTHISSHIFT'$ @   Physical Exam  Lab Results:  Results for orders placed or performed in visit on 07/12/22 (from the past 24 hour(s))  Urinalysis, Routine w reflex microscopic     Status: Abnormal   Collection Time: 07/12/22  2:54 PM  Result Value Ref Range  Specific Gravity, UA 1.015 1.005 - 1.030   pH, UA 5.5 5.0 - 7.5   Color, UA Yellow Yellow   Appearance Ur Clear Clear   Leukocytes,UA Trace (A) Negative   Protein,UA Negative Negative/Trace   Glucose, UA Negative Negative   Ketones, UA Negative Negative   RBC, UA Negative Negative   Bilirubin, UA Negative Negative   Urobilinogen, Ur 0.2 0.2 - 1.0 mg/dL   Nitrite, UA Negative Negative   Microscopic Examination See below:    Narrative   Performed at:  [facility] [address] [place], [place]  [id number] Lab Director: Mina Marble MT, Phone:  [phone]  Microscopic Examination     Status: None   Collection Time: 07/12/22  2:54 PM   Urine  Result Value Ref Range   WBC, UA 0-5 0 - 5 /hpf   RBC, Urine 0-2 0 - 2 /hpf   Epithelial Cells (non renal) 0-10 0 - 10 /hpf   Bacteria, UA None seen None seen/Few   Narrative   Performed at:  [facility] [address] [place], [place]  [id number] Lab  Director: Mina Marble MT, Phone:  [phone]      Recent Results (from the past 2160 hour(s))  CBC with Differential/Platelet     Status: Abnormal   Collection Time: 06/21/22 12:18 PM  Result Value Ref Range   WBC 11.0 (H) 4.0 - 10.5 K/uL   RBC 5.12 4.22 - 5.81 MIL/uL   Hemoglobin 15.4 13.0 - 17.0 g/dL   HCT 48.5 39.0 - 52.0 %   MCV 94.7 80.0 - 100.0 fL   MCH 30.1 26.0 - 34.0 pg   MCHC 31.8 30.0 - 36.0 g/dL   RDW 16.4 (H) 11.5 - 15.5 %   Platelets 142 (L) 150 - 400 K/uL   nRBC 0.2 0.0 - 0.2 %   Neutrophils Relative % 76 %   Neutro Abs 8.6 (H) 1.7 - 7.7 K/uL   Lymphocytes Relative 13 %   Lymphs Abs 1.4 0.7 - 4.0 K/uL   Monocytes Relative 8 %   Monocytes Absolute 0.8 0.1 - 1.0 K/uL   Eosinophils Relative 1 %   Eosinophils Absolute 0.1 0.0 - 0.5 K/uL   Basophils Relative 1 %   Basophils Absolute 0.1 0.0 - 0.1 K/uL   Immature Granulocytes 1 %   Abs Immature Granulocytes 0.07 0.00 - 0.07 K/uL    Comment: Performed at [facility], [address] [place], [place] [address]  Comprehensive metabolic panel     Status: Abnormal   Collection Time: 06/21/22 12:18 PM  Result Value Ref Range   Sodium 137 135 - 145 mmol/L   Potassium 3.7 3.5 - 5.1 mmol/L   Chloride 97 (L) 98 - 111 mmol/L   CO2 31 22 - 32 mmol/L   Glucose, Bld 111 (H) 70 - 99 mg/dL    Comment: Glucose reference range applies only to samples taken after fasting for at least 8 hours.   BUN 15 8 - 23 mg/dL   Creatinine, Ser 0.82 0.61 - 1.24 mg/dL   Calcium 9.0 8.9 - 10.3 mg/dL   Total Protein 6.7 6.5 - 8.1 g/dL   Albumin 4.1 3.5 - 5.0 g/dL   AST 30 15 - 41 U/L   ALT 33 0 - 44 U/L   Alkaline Phosphatase 60 38 - 126 U/L   Total Bilirubin 2.4 (H) 0.3 - 1.2 mg/dL   GFR, Estimated >60 >60 mL/min    Comment: (  NOTE) Calculated using the CKD-EPI Creatinine Equation (2021)    Anion gap 9 5 - 15    Comment: Performed at [facility], [address], [place], [place] [address]  Lactate dehydrogenase     Status: None    Collection Time: 06/21/22 12:18 PM  Result Value Ref Range   LDH 169 98 - 192 U/L    Comment: Performed at [facility], [address], [place], [place] [address]  BLADDER SCAN AMB NON-IMAGING     Status: None   Collection Time: 07/05/22  2:26 PM  Result Value Ref Range   Scan Result 0   Urinalysis, Routine w reflex microscopic     Status: Abnormal   Collection Time: 07/05/22  2:28 PM  Result Value Ref Range   Specific Gravity, UA 1.020 1.005 - 1.030   pH, UA 8.5 (H) 5.0 - 7.5   Color, UA Yellow Yellow   Appearance Ur Cloudy (A) Clear   Leukocytes,UA 1+ (A) Negative   Protein,UA 3+ (A) Negative/Trace   Glucose, UA Negative Negative   Ketones, UA Negative Negative   RBC, UA 3+ (A) Negative   Bilirubin, UA Negative Negative   Urobilinogen, Ur 4.0 (H) 0.2 - 1.0 mg/dL   Nitrite, UA Negative Negative   Microscopic Examination See below:     Comment: Microscopic was indicated and was performed.  Microscopic Examination     Status: Abnormal   Collection Time: 07/05/22  2:28 PM   Urine  Result Value Ref Range   WBC, UA >30 (A) 0 - 5 /hpf   RBC, Urine 11-30 (A) 0 - 2 /hpf   Epithelial Cells (non renal) 0-10 0 - 10 /hpf   Bacteria, UA Moderate (A) None seen/Few  Urine Culture     Status: Abnormal   Collection Time: 07/05/22  3:13 PM   Specimen: Urine, Clean Catch   UR  Result Value Ref Range   Urine Culture, Routine Final report (A)    Organism ID, Bacteria Escherichia coli (A)     Comment: Cefazolin <=4 ug/mL Cefazolin with an MIC <=16 predicts susceptibility to the oral agents cefaclor, cefdinir, cefpodoxime, cefprozil, cefuroxime, cephalexin, and loracarbef when used for therapy of uncomplicated urinary tract infections due to E. coli, Klebsiella pneumoniae, and Proteus mirabilis. 50,000-100,000 colony forming units per mL    ORGANISM ID, BACTERIA Klebsiella oxytoca (A)     Comment: 25,000-50,000 colony forming units per mL   Antimicrobial Susceptibility Comment      Comment:       ** S = Susceptible; I = Intermediate; R = Resistant **                    P = Positive; N = Negative             MICS are expressed in micrograms per mL    Antibiotic                 RSLT#1    RSLT#2    RSLT#3    RSLT#4 Amoxicillin/Clavulanic Acid    I         S Ampicillin                     R         R Cefepime                       S         S  Ceftriaxone                    S         S Cefuroxime                     S         S Ciprofloxacin                  S         S Ertapenem                      S Gentamicin                     S         S Imipenem                       S         S Levofloxacin                   S         S Meropenem                      S         S Nitrofurantoin                 S         S Piperacillin/Tazobactam        S Tetracycline                   S         S Tobramycin                     S         S Trimethoprim/Sulfa             S         S   CBC with Differential     Status: Abnormal   Collection Time: 07/06/22 12:53 PM  Result Value Ref Range   WBC 14.6 (H) 4.0 - 10.5 K/uL   RBC 5.12 4.22 - 5.81 MIL/uL   Hemoglobin 15.4 13.0 - 17.0 g/dL   HCT 46.4 39.0 - 52.0 %   MCV 90.6 80.0 - 100.0 fL   MCH 30.1 26.0 - 34.0 pg   MCHC 33.2 30.0 - 36.0 g/dL   RDW 15.9 (H) 11.5 - 15.5 %   Platelets 162 150 - 400 K/uL   nRBC 0.0 0.0 - 0.2 %   Neutrophils Relative % 76 %   Neutro Abs 11.2 (H) 1.7 - 7.7 K/uL   Lymphocytes Relative 16 %   Lymphs Abs 2.3 0.7 - 4.0 K/uL   Monocytes Relative 6 %   Monocytes Absolute 0.9 0.1 - 1.0 K/uL   Eosinophils Relative 1 %   Eosinophils Absolute 0.1 0.0 - 0.5 K/uL   Basophils Relative 0 %   Basophils Absolute 0.1 0.0 - 0.1 K/uL   Immature Granulocytes 1 %   Abs Immature Granulocytes 0.11 (H) 0.00 - 0.07 K/uL    Comment: Performed at [facility], [address], [place], [place] [id number]  Basic metabolic panel     Status: Abnormal   Collection Time: 07/06/22 12:53 PM  Result Value Ref Range   Sodium  130 (L) 135 -  145 mmol/L   Potassium 3.1 (L) 3.5 - 5.1 mmol/L   Chloride 91 (L) 98 - 111 mmol/L   CO2 32 22 - 32 mmol/L   Glucose, Bld 92 70 - 99 mg/dL    Comment: Glucose reference range applies only to samples taken after fasting for at least 8 hours.   BUN 25 (H) 8 - 23 mg/dL   Creatinine, Ser 1.34 (H) 0.61 - 1.24 mg/dL   Calcium 8.3 (L) 8.9 - 10.3 mg/dL   GFR, Estimated 56 (L) >60 mL/min    Comment: (NOTE) Calculated using the CKD-EPI Creatinine Equation (2021)    Anion gap 7 5 - 15    Comment: Performed at [facility], [address] [place], [place] [address]  Urinalysis, Routine w reflex microscopic -Urine, Clean Catch     Status: Abnormal   Collection Time: 07/06/22  1:00 PM  Result Value Ref Range   Color, Urine YELLOW YELLOW   APPearance CLEAR CLEAR   Specific Gravity, Urine 1.004 (L) 1.005 - 1.030   pH 6.0 5.0 - 8.0   Glucose, UA NEGATIVE NEGATIVE mg/dL   Hgb urine dipstick LARGE (A) NEGATIVE   Bilirubin Urine NEGATIVE NEGATIVE   Ketones, ur NEGATIVE NEGATIVE mg/dL   Protein, ur NEGATIVE NEGATIVE mg/dL   Nitrite NEGATIVE NEGATIVE   Leukocytes,Ua SMALL (A) NEGATIVE   RBC / HPF 21-50 0 - 5 RBC/hpf   WBC, UA 11-20 0 - 5 WBC/hpf   Bacteria, UA NONE SEEN NONE SEEN   Squamous Epithelial / HPF 0-5 0 - 5 /HPF    Comment: Performed at [facility], [address], [place],  [address]  Urinalysis, Routine w reflex microscopic     Status: Abnormal   Collection Time: 07/12/22  2:54 PM  Result Value Ref Range   Specific Gravity, UA 1.015 1.005 - 1.030   pH, UA 5.5 5.0 - 7.5   Color, UA Yellow Yellow   Appearance Ur Clear Clear   Leukocytes,UA Trace (A) Negative   Protein,UA Negative Negative/Trace   Glucose, UA Negative Negative   Ketones, UA Negative Negative   RBC, UA Negative Negative   Bilirubin, UA Negative Negative   Urobilinogen, Ur 0.2 0.2 - 1.0 mg/dL   Nitrite, UA Negative Negative   Microscopic Examination See below:   Microscopic Examination      Status: None   Collection Time: 07/12/22  2:54 PM   Urine  Result Value Ref Range   WBC, UA 0-5 0 - 5 /hpf   RBC, Urine 0-2 0 - 2 /hpf   Epithelial Cells (non renal) 0-10 0 - 10 /hpf   Bacteria, UA None seen None seen/Few    BMET No results for input(s): "NA", "K", "CL", "CO2", "GLUCOSE", "BUN", "CREATININE", "CALCIUM" in the last 72 hours. PT/INR No results for input(s): "LABPROT", "INR" in the last 72 hours. ABG No results for input(s): "PHART", "HCO3" in the last 72 hours.  Invalid input(s): "PCO2", "PO2" UA has >30 WBC, 11-30 RBC's and mod bacteria but is nit -. Studies/Results: CT Renal Stone Study  Result Date: 07/06/2022 CLINICAL DATA:  Abdominal and flank pain, lower abdominal pain, question kidney stone, UTI, on antibiotics beginning yesterday, urinary retention EXAM: CT ABDOMEN AND PELVIS WITHOUT CONTRAST TECHNIQUE: Multidetector CT imaging of the abdomen and pelvis was performed following the standard protocol without IV contrast. RADIATION DOSE REDUCTION: This exam was performed according to the departmental dose-optimization program which includes automated exposure control, adjustment of the mA and/or kV according to  patient size and/or use of iterative reconstruction technique. COMPARISON:  09/27/2021 FINDINGS: Lower chest: Lung bases clear Hepatobiliary: Gallbladder surgically absent. Liver normal appearance. Pancreas: Normal appearance Spleen: Normal appearance Adrenals/Urinary Tract: Adrenal glands normal appearance. Kidneys and ureters normal appearance. No urinary tract calcification or dilatation. No definite renal mass. Mild bladder wall thickening, could be related to chronic outlet obstruction but cannot exclude urinary tract infection. Stomach/Bowel: Distal colonic diverticulosis without evidence of diverticulitis. Appendix surgically absent. Stomach and bowel loops otherwise unremarkable. Vascular/Lymphatic: Atherosclerotic calcifications aorta and iliac arteries  without aneurysm. No adenopathy. Reproductive: Unremarkable seminal vesicles. Minimal prostatic enlargement. Other: No free air or free fluid. No hernia or inflammatory process. Musculoskeletal: LEFT hip prosthesis.  Osseous demineralization. IMPRESSION: Distal colonic diverticulosis without evidence of diverticulitis. Minimal prostatic enlargement. Mild bladder wall thickening which could be due to chronic bladder outlet obstruction or cystitis, recommend correlation with urinalysis. Aortic Atherosclerosis (ICD10-I70.0). Electronically Signed   By: Lavonia Dana M.D.   On: 07/06/2022 14:41      Assessment/Plan: Acute cystitis.   He is better but was switched to [employer].   F/U in 6 weeks.   BPH with UUI.   He is emptying well on alfuzosin.   No orders of the defined types were placed in this encounter.    Orders Placed This Encounter  Procedures   Microscopic Examination   Urinalysis, Routine w reflex microscopic     Return in about 6 weeks (around 08/23/2022).    CC: Lars Mage NP.      Irine Seal 07/13/2022 [id number] ID: [name], male   DOB: 03-11-1951, 72 y.o.   MRN: [id number] Patient ID: PAUBLO ZIMMERLE, male   DOB: Jul 27, 1950, 72 y.o.   MRN: [id number]

## 2022-07-13 ENCOUNTER — Encounter: Payer: Self-pay | Admitting: Urology

## 2022-07-13 MED FILL — Sodium Chloride IV Soln 0.9%: INTRAVENOUS | Qty: 500 | Status: AC

## 2022-07-26 ENCOUNTER — Ambulatory Visit: Payer: Medicare Other | Admitting: Urology

## 2022-08-26 ENCOUNTER — Emergency Department (HOSPITAL_COMMUNITY)
Admission: EM | Admit: 2022-08-26 | Discharge: 2022-08-26 | Disposition: A | Discharge status: Home / Self Care | Payer: 59 | Attending: Emergency Medicine | Admitting: Emergency Medicine

## 2022-08-26 ENCOUNTER — Other Ambulatory Visit: Payer: Self-pay

## 2022-08-26 ENCOUNTER — Encounter (HOSPITAL_COMMUNITY): Payer: Self-pay

## 2022-08-26 ENCOUNTER — Emergency Department (HOSPITAL_COMMUNITY): Payer: 59

## 2022-08-26 DIAGNOSIS — Z7901 Long term (current) use of anticoagulants: Secondary | ICD-10-CM | POA: Insufficient documentation

## 2022-08-26 DIAGNOSIS — J9801 Acute bronchospasm: Secondary | ICD-10-CM

## 2022-08-26 DIAGNOSIS — Z20822 Contact with and (suspected) exposure to covid-19: Secondary | ICD-10-CM | POA: Diagnosis not present

## 2022-08-26 DIAGNOSIS — R062 Wheezing: Secondary | ICD-10-CM | POA: Diagnosis present

## 2022-08-26 LAB — BRAIN NATRIURETIC PEPTIDE: B Natriuretic Peptide: 188 pg/mL — ABNORMAL HIGH (ref 0.0–100.0)

## 2022-08-26 LAB — BASIC METABOLIC PANEL
Anion gap: 9 (ref 5–15)
BUN: 12 mg/dL (ref 8–23)
CO2: 32 mmol/L (ref 22–32)
Calcium: 9.3 mg/dL (ref 8.9–10.3)
Chloride: 96 mmol/L — ABNORMAL LOW (ref 98–111)
Creatinine, Ser: 0.82 mg/dL (ref 0.61–1.24)
GFR, Estimated: >60 mL/min (ref >60)
Glucose, Bld: 114 mg/dL — ABNORMAL HIGH (ref 70–99)
Potassium: 3.7 mmol/L (ref 3.5–5.1)
Sodium: 137 mmol/L (ref 135–145)

## 2022-08-26 LAB — CBC WITH DIFFERENTIAL/PLATELET
Abs Immature Granulocytes: 0.03 10*3/uL (ref 0.00–0.07)
Basophils Absolute: 0.1 10*3/uL (ref 0.0–0.1)
Basophils Relative: 1 %
Eosinophils Absolute: 0.1 10*3/uL (ref 0.0–0.5)
Eosinophils Relative: 1 %
HCT: 49.1 % (ref 39.0–52.0)
Hemoglobin: 16.1 g/dL (ref 13.0–17.0)
Immature Granulocytes: 0 %
Lymphocytes Relative: 12 %
Lymphs Abs: 1.1 10*3/uL (ref 0.7–4.0)
MCH: 30.6 pg (ref 26.0–34.0)
MCHC: 32.8 g/dL (ref 30.0–36.0)
MCV: 93.3 fL (ref 80.0–100.0)
Monocytes Absolute: 0.6 10*3/uL (ref 0.1–1.0)
Monocytes Relative: 7 %
Neutro Abs: 7.6 10*3/uL (ref 1.7–7.7)
Neutrophils Relative %: 79 %
Platelets: 162 10*3/uL (ref 150–400)
RBC: 5.26 MIL/uL (ref 4.22–5.81)
RDW: 14.6 % (ref 11.5–15.5)
WBC: 9.6 10*3/uL (ref 4.0–10.5)
nRBC: 0 % (ref 0.0–0.2)

## 2022-08-26 LAB — SARS CORONAVIRUS 2 BY RT PCR: SARS Coronavirus 2 by RT PCR: NEGATIVE

## 2022-08-26 MED ORDER — ALBUTEROL SULFATE (2.5 MG/3ML) 0.083% IN NEBU
2.5000 mg | INHALATION_SOLUTION | Freq: Once | RESPIRATORY_TRACT | Status: AC
  Administered 2022-08-26: 2.5 mg via RESPIRATORY_TRACT
  Filled 2022-08-26: qty 3

## 2022-08-26 MED ORDER — POTASSIUM CHLORIDE CRYS ER 20 MEQ PO TBCR
20.0000 meq | EXTENDED_RELEASE_TABLET | Freq: Once | ORAL | Status: AC
  Administered 2022-08-26: 20 meq via ORAL
  Filled 2022-08-26: qty 1

## 2022-08-26 MED ORDER — FUROSEMIDE 10 MG/ML IJ SOLN
20.0000 mg | Freq: Once | INTRAMUSCULAR | Status: AC
  Administered 2022-08-26: 20 mg via INTRAVENOUS
  Filled 2022-08-26: qty 2

## 2022-08-26 MED ORDER — ALBUTEROL SULFATE HFA 108 (90 BASE) MCG/ACT IN AERS
2.0000 | INHALATION_SPRAY | Freq: Once | RESPIRATORY_TRACT | Status: AC
  Administered 2022-08-26: 2 via RESPIRATORY_TRACT
  Filled 2022-08-26: qty 6.7

## 2022-08-26 NOTE — ED Notes (Signed)
Pt SPO2 91-92% while ambulating.

## 2022-08-26 NOTE — Discharge Instructions (Addendum)
You were seen in the emergency department for shortness of breath.  You were given breathing treatments with improvement in your symptoms.  You may have also had a little extra fluid in your lungs.  Please continue regular medications and follow-up with your primary care doctor.  You can use the albuterol inhaler 2 puffs every 4 hours as needed.  Return to the emergency department if any worsening or concerning symptoms

## 2022-08-26 NOTE — ED Triage Notes (Signed)
Patient complains of wheezing that started last night. Patient has a cough and it producing yellow mucous

## 2022-08-26 NOTE — ED Provider Notes (Signed)
[place] EMERGENCY DEPARTMENT AT [facility] Provider Note   CSN: [id number] Arrival date & time: 08/26/22  4098     History  Chief Complaint  Patient presents with   Wheezing    Roy [name] is a 72 y.o. male.  He is here with a complaining of shortness of breath especially when he lays down at night and wheezing that started last evening.  Said he could not sleep.  He has had some congestion and has had congestion chest cough productive of some yellow sputum.  Has been taking Mucinex with some improvement.  He thinks it is related to allergies and pollen said he mowed 2 lawns yesterday.  He is on chronic steroids and Eliquis.  Does not use an inhaler.  No fever no chest pain no abdominal pain.  Said he gets leg swelling at the end of the day.  The history is provided by the patient.  Shortness of Breath Severity:  Moderate Onset quality:  Gradual Timing:  Constant Progression:  Unchanged Chronicity:  New Relieved by:  Sitting up Exacerbated by: Laying down. Associated symptoms: cough, sputum production and wheezing   Associated symptoms: no abdominal pain, no chest pain, no fever, no hemoptysis and no vomiting   Risk factors: hx of cancer        Home Medications Prior to Admission medications   Medication Sig Start Date End Date Taking? Authorizing Provider  acetaminophen (TYLENOL) 325 MG tablet Take 2 tablets (650 mg total) by mouth every 6 (six) hours as needed for headache. 01/13/21   Shon Hale, MD  alfuzosin (UROXATRAL) 10 MG 24 hr tablet TAKE ONE TABLET DAILY WITH BREAKFAST 02/01/22   Bjorn Pippin, MD  Calcium Carbonate-Vitamin D (CALCIUM-VITAMIN D) 600-125 MG-UNIT TABS Take by mouth.    [provider]  cefpodoxime (VANTIN) 200 MG tablet Take 1 tablet (200 mg total) by mouth 2 (two) times daily. 07/06/22   Triplett, Tammy, PA-C  Cholecalciferol (VITAMIN D3) 25 MCG (1000 UT) CAPS Take by mouth daily.    [provider]  ELIQUIS 5 MG  TABS tablet Take 5 mg by mouth 2 (two) times daily. 08/03/21   [provider]  EQUATE STOOL SOFTENER 100 MG capsule Take 2 capsules by mouth twice daily 08/13/21   Carnella Guadalajara, PA-C  FEROSUL 325 (65 Fe) MG tablet Take 1 tablet by mouth once daily with breakfast 07/30/21   Rojelio Brenner M, PA-C  fludrocortisone (FLORINEF) 0.1 MG tablet TAKE ONE TABLET ONCE DAILY WITH BREAKFAST 06/15/22   Roma Kayser, MD  lactulose (CHRONULAC) 10 GM/15ML solution Take by mouth. 12/12/21   [provider]  levothyroxine (SYNTHROID) 100 MCG tablet TAKE ONE TABLET ONCE DAILY BEFORE BREAKFAST 06/07/22   Roma Kayser, MD  magnesium oxide (MAG-OX) 400 (240 Mg) MG tablet Take 400 mg by mouth daily.    [provider]  omeprazole (PRILOSEC) 10 MG capsule Take by mouth. 10/26/21   [provider]  potassium chloride (KLOR-CON) 10 MEQ tablet TAKE TWO TABLETS TWICE DAILY 06/19/22   Roma Kayser, MD  predniSONE (DELTASONE) 10 MG tablet TAKE ONE TABLET ONCE DAILY WITH BREAKFAST 05/29/22   Roma Kayser, MD  pregabalin (LYRICA) 75 MG capsule Take by mouth. 01/31/22   [provider]  rosuvastatin (CRESTOR) 5 MG tablet Take 1 tablet by mouth at bedtime. 08/23/20   [provider]  valACYclovir (VALTREX) 1000 MG tablet Take 1,000 mg by mouth daily.    [provider]  vitamin B-12 (CYANOCOBALAMIN) 1000 MCG tablet Take 1 tablet (1,000 mcg total) by mouth daily. 02/28/21   Carnella Guadalajara, PA-C      Allergies    Adhesive [tape] and Sulfa antibiotics    Review of Systems   Review of Systems  Constitutional:  Negative for fever.  HENT:  Positive for postnasal drip and sinus pressure.   Eyes:  Negative for visual disturbance.  Respiratory:  Positive for cough, sputum production, shortness of breath and wheezing. Negative for hemoptysis.   Cardiovascular:  Negative for chest pain.  Gastrointestinal:  Negative for abdominal  pain and vomiting.    Physical Exam Updated Vital Signs BP 136/60   Pulse 65   Temp 98.2 F (36.8 C) (Oral)   Resp 14   Ht 6' (1.829 m)   Wt 122.5 kg   SpO2 95%   BMI 36.62 kg/m  Physical Exam Vitals and nursing note reviewed.  Constitutional:      General: He is not in acute distress.    Appearance: Normal appearance. He is well-developed. He is obese.  HENT:     Head: Normocephalic and atraumatic.  Eyes:     Conjunctiva/sclera: Conjunctivae normal.  Cardiovascular:     Rate and Rhythm: Normal rate. Rhythm irregular.     Heart sounds: No murmur heard. Pulmonary:     Effort: Pulmonary effort is normal. No respiratory distress.     Breath sounds: Normal breath sounds.  Abdominal:     Palpations: Abdomen is soft.     Tenderness: There is no abdominal tenderness. There is no guarding or rebound.  Musculoskeletal:     Cervical back: Neck supple.     Right lower leg: No edema.     Left lower leg: No edema.  Skin:    General: Skin is warm and dry.     Capillary Refill: Capillary refill takes less than 2 seconds.  Neurological:     General: No focal deficit present.     Mental Status: He is alert.     ED Results / Procedures / Treatments   Labs (all labs ordered are listed, but only abnormal results are displayed) Labs Reviewed  BASIC METABOLIC PANEL - Abnormal; Notable for the following components:      Result Value   Chloride 96 (*)    Glucose, Bld 114 (*)    All other components within normal limits  BRAIN NATRIURETIC PEPTIDE - Abnormal; Notable for the following components:   B Natriuretic Peptide 188.0 (*)    All other components within normal limits  SARS CORONAVIRUS 2 BY RT PCR  CBC WITH DIFFERENTIAL/PLATELET    EKG EKG Interpretation  Date/Time:  Sunday August 26 2022 07:50:09 EDT Ventricular Rate:  51 PR Interval:    QRS Duration: 160 QT Interval:  430 QTC Calculation: 396 R Axis:   -56 Text Interpretation: Atrial fibrillation Right bundle  branch block LVH with IVCD and secondary repol abnrm Baseline wander in lead(s) V5 No significant change since prior 3/23 Confirmed by Meridee Score [phone]) on 08/26/2022 7:57:06 AM  Radiology DG Chest Port 1 View  Result Date: 08/26/2022 CLINICAL DATA:  Shortness of breath and wheezing.  Productive cough. EXAM: PORTABLE CHEST 1 VIEW COMPARISON:  07/13/2021 FINDINGS: Right-sided power port remains in appropriate position. Mild cardiomegaly stable. Both lungs are clear. IMPRESSION: Mild cardiomegaly. No active lung disease. Electronically Signed   By: Danae Orleans M.D.   On: 08/26/2022 08:05    Procedures Procedures  Medications Ordered in ED Medications  albuterol (PROVENTIL) (2.5 MG/3ML) 0.083% nebulizer solution 2.5 mg (2.5 mg Nebulization Given 08/26/22 0818)  furosemide (LASIX) injection 20 mg (20 mg Intravenous Given 08/26/22 0940)  potassium chloride SA (KLOR-CON M) CR tablet 20 mEq (20 mEq Oral Given 08/26/22 0940)  albuterol (VENTOLIN HFA) 108 (90 Base) MCG/ACT inhaler 2 puff (2 puffs Inhalation Given 08/26/22 1031)    ED Course/ Medical Decision Making/ A&P Clinical Course as of 08/26/22 1711  Sun Aug 26, 2022  9604 Chest x-ray interpreted by me as cardiomegaly no gross infiltrate.  Does have a port on the right.  No pneumothorax.  Awaiting radiology reading. [MB]  1026 Patient states he feels better after breathing treatment.  Will get him MDI teaching.  BNP was mildly elevated given dose of diuretic. [MB]    Clinical Course User Index [MB] Terrilee Files, MD                             Medical Decision Making Amount and/or Complexity of Data Reviewed Labs: ordered. Radiology: ordered.  Risk Prescription drug management.   This patient complains of wheezing at night and orthopnea; this involves an extensive number of treatment Options and is a complaint that carries with it a high risk of complications and morbidity. The differential includes CHF, COPD, reactive  airway disease, COVID, pneumonia  I ordered, reviewed and interpreted labs, which included CBC with normal white count and hemoglobin, chemistries fairly normal, BNP elevated no priors to compare with, COVID-negative I ordered medication breathing treatments and IV Lasix and reviewed PMP when indicated. I ordered imaging studies which included chest x-ray and I independently    visualized and interpreted imaging which showed cardiomegaly no acute findings Previous records obtained and reviewed in epic no recent admissions Cardiac monitoring reviewed, sinus bradycardia Social determinants considered, no significant barriers Critical Interventions: None  After the interventions stated above, I reevaluated the patient and found patient to be breathing comfortably symptomatically improved Admission and further testing considered, no indications for admission or further workup at this time.  Will send patient home with MDI inhaler and recommended close follow-up with PCP.  Return instructions discussed         Final Clinical Impression(s) / ED Diagnoses Final diagnoses:  Bronchospasm    Rx / DC Orders ED Discharge Orders     None         Terrilee Files, MD 08/26/22 [phone]

## 2022-09-05 ENCOUNTER — Inpatient Hospital Stay (HOSPITAL_COMMUNITY)
Admission: EM | Admit: 2022-09-05 | Discharge: 2022-09-08 | DRG: 243 | Disposition: A | Discharge status: Home / Self Care | Payer: 59 | Attending: Internal Medicine | Admitting: Internal Medicine

## 2022-09-05 ENCOUNTER — Encounter (HOSPITAL_COMMUNITY): Payer: Self-pay | Admitting: Emergency Medicine

## 2022-09-05 ENCOUNTER — Emergency Department (HOSPITAL_COMMUNITY): Payer: 59

## 2022-09-05 DIAGNOSIS — Z713 Dietary counseling and surveillance: Secondary | ICD-10-CM

## 2022-09-05 DIAGNOSIS — Z96642 Presence of left artificial hip joint: Secondary | ICD-10-CM | POA: Diagnosis present

## 2022-09-05 DIAGNOSIS — I4891 Unspecified atrial fibrillation: Secondary | ICD-10-CM | POA: Diagnosis not present

## 2022-09-05 DIAGNOSIS — Z882 Allergy status to sulfonamides status: Secondary | ICD-10-CM

## 2022-09-05 DIAGNOSIS — Z79890 Hormone replacement therapy (postmenopausal): Secondary | ICD-10-CM | POA: Diagnosis not present

## 2022-09-05 DIAGNOSIS — N400 Enlarged prostate without lower urinary tract symptoms: Secondary | ICD-10-CM | POA: Diagnosis present

## 2022-09-05 DIAGNOSIS — I10 Essential (primary) hypertension: Secondary | ICD-10-CM | POA: Diagnosis present

## 2022-09-05 DIAGNOSIS — Z7952 Long term (current) use of systemic steroids: Secondary | ICD-10-CM | POA: Diagnosis not present

## 2022-09-05 DIAGNOSIS — R7989 Other specified abnormal findings of blood chemistry: Secondary | ICD-10-CM | POA: Diagnosis present

## 2022-09-05 DIAGNOSIS — Z9221 Personal history of antineoplastic chemotherapy: Secondary | ICD-10-CM

## 2022-09-05 DIAGNOSIS — R001 Bradycardia, unspecified: Secondary | ICD-10-CM | POA: Diagnosis present

## 2022-09-05 DIAGNOSIS — I442 Atrioventricular block, complete: Principal | ICD-10-CM | POA: Diagnosis present

## 2022-09-05 DIAGNOSIS — Z8249 Family history of ischemic heart disease and other diseases of the circulatory system: Secondary | ICD-10-CM | POA: Diagnosis not present

## 2022-09-05 DIAGNOSIS — K219 Gastro-esophageal reflux disease without esophagitis: Secondary | ICD-10-CM | POA: Diagnosis present

## 2022-09-05 DIAGNOSIS — R062 Wheezing: Secondary | ICD-10-CM

## 2022-09-05 DIAGNOSIS — R600 Localized edema: Secondary | ICD-10-CM | POA: Diagnosis present

## 2022-09-05 DIAGNOSIS — I482 Chronic atrial fibrillation, unspecified: Secondary | ICD-10-CM | POA: Diagnosis present

## 2022-09-05 DIAGNOSIS — I453 Trifascicular block: Secondary | ICD-10-CM | POA: Diagnosis present

## 2022-09-05 DIAGNOSIS — Z79899 Other long term (current) drug therapy: Secondary | ICD-10-CM

## 2022-09-05 DIAGNOSIS — R0609 Other forms of dyspnea: Secondary | ICD-10-CM | POA: Diagnosis not present

## 2022-09-05 DIAGNOSIS — Z6839 Body mass index (BMI) 39.0-39.9, adult: Secondary | ICD-10-CM

## 2022-09-05 DIAGNOSIS — Z1152 Encounter for screening for COVID-19: Secondary | ICD-10-CM | POA: Diagnosis not present

## 2022-09-05 DIAGNOSIS — Z8572 Personal history of non-Hodgkin lymphomas: Secondary | ICD-10-CM | POA: Diagnosis not present

## 2022-09-05 DIAGNOSIS — I4819 Other persistent atrial fibrillation: Secondary | ICD-10-CM | POA: Diagnosis not present

## 2022-09-05 DIAGNOSIS — Z7901 Long term (current) use of anticoagulants: Secondary | ICD-10-CM | POA: Diagnosis not present

## 2022-09-05 DIAGNOSIS — E669 Obesity, unspecified: Secondary | ICD-10-CM | POA: Diagnosis present

## 2022-09-05 DIAGNOSIS — M7989 Other specified soft tissue disorders: Secondary | ICD-10-CM | POA: Diagnosis present

## 2022-09-05 DIAGNOSIS — Z91048 Other nonmedicinal substance allergy status: Secondary | ICD-10-CM | POA: Diagnosis not present

## 2022-09-05 DIAGNOSIS — E7800 Pure hypercholesterolemia, unspecified: Secondary | ICD-10-CM | POA: Diagnosis present

## 2022-09-05 DIAGNOSIS — E2740 Unspecified adrenocortical insufficiency: Secondary | ICD-10-CM | POA: Diagnosis present

## 2022-09-05 DIAGNOSIS — E039 Hypothyroidism, unspecified: Secondary | ICD-10-CM | POA: Diagnosis present

## 2022-09-05 DIAGNOSIS — R531 Weakness: Secondary | ICD-10-CM

## 2022-09-05 DIAGNOSIS — C8510 Unspecified B-cell lymphoma, unspecified site: Secondary | ICD-10-CM | POA: Diagnosis present

## 2022-09-05 DIAGNOSIS — I509 Heart failure, unspecified: Secondary | ICD-10-CM | POA: Diagnosis not present

## 2022-09-05 DIAGNOSIS — Z9049 Acquired absence of other specified parts of digestive tract: Secondary | ICD-10-CM

## 2022-09-05 LAB — URINALYSIS, W/ REFLEX TO CULTURE (INFECTION SUSPECTED)
Bacteria, UA: NONE SEEN
Bilirubin Urine: NEGATIVE
Glucose, UA: NEGATIVE mg/dL
Hgb urine dipstick: NEGATIVE
Ketones, ur: NEGATIVE mg/dL
Leukocytes,Ua: NEGATIVE
Nitrite: NEGATIVE
Protein, ur: NEGATIVE mg/dL
Specific Gravity, Urine: 1.005 (ref 1.005–1.030)
pH: 5 (ref 5.0–8.0)

## 2022-09-05 LAB — BASIC METABOLIC PANEL
Anion gap: 8 (ref 5–15)
BUN: 17 mg/dL (ref 8–23)
CO2: 27 mmol/L (ref 22–32)
Calcium: 9.2 mg/dL (ref 8.9–10.3)
Chloride: 101 mmol/L (ref 98–111)
Creatinine, Ser: 0.85 mg/dL (ref 0.61–1.24)
GFR, Estimated: >60 mL/min (ref >60)
Glucose, Bld: 127 mg/dL — ABNORMAL HIGH (ref 70–99)
Potassium: 4.4 mmol/L (ref 3.5–5.1)
Sodium: 136 mmol/L (ref 135–145)

## 2022-09-05 LAB — TROPONIN I (HIGH SENSITIVITY)
Troponin I (High Sensitivity): 23 ng/L — ABNORMAL HIGH (ref <18)
Troponin I (High Sensitivity): 19 ng/L — ABNORMAL HIGH (ref <18)

## 2022-09-05 LAB — CBG MONITORING, ED: Glucose-Capillary: 130 mg/dL — ABNORMAL HIGH (ref 70–99)

## 2022-09-05 LAB — CBC WITH DIFFERENTIAL/PLATELET
Abs Immature Granulocytes: 0.07 10*3/uL (ref 0.00–0.07)
Basophils Absolute: 0.1 10*3/uL (ref 0.0–0.1)
Basophils Relative: 1 %
Eosinophils Absolute: 0.1 10*3/uL (ref 0.0–0.5)
Eosinophils Relative: 1 %
HCT: 46.1 % (ref 39.0–52.0)
Hemoglobin: 14.9 g/dL (ref 13.0–17.0)
Immature Granulocytes: 1 %
Lymphocytes Relative: 12 %
Lymphs Abs: 1.1 10*3/uL (ref 0.7–4.0)
MCH: 30.8 pg (ref 26.0–34.0)
MCHC: 32.3 g/dL (ref 30.0–36.0)
MCV: 95.2 fL (ref 80.0–100.0)
Monocytes Absolute: 0.5 10*3/uL (ref 0.1–1.0)
Monocytes Relative: 6 %
Neutro Abs: 7.3 10*3/uL (ref 1.7–7.7)
Neutrophils Relative %: 79 %
Platelets: 164 10*3/uL (ref 150–400)
RBC: 4.84 MIL/uL (ref 4.22–5.81)
RDW: 15.8 % — ABNORMAL HIGH (ref 11.5–15.5)
WBC: 9.2 10*3/uL (ref 4.0–10.5)
nRBC: 0.4 % — ABNORMAL HIGH (ref 0.0–0.2)

## 2022-09-05 LAB — SARS CORONAVIRUS 2 BY RT PCR: SARS Coronavirus 2 by RT PCR: NEGATIVE

## 2022-09-05 LAB — TSH: TSH: 1.541 u[IU]/mL (ref 0.350–4.500)

## 2022-09-05 LAB — BRAIN NATRIURETIC PEPTIDE: B Natriuretic Peptide: 394 pg/mL — ABNORMAL HIGH (ref 0.0–100.0)

## 2022-09-05 MED ORDER — ACETAMINOPHEN 325 MG PO TABS: 650.0000 mg | ORAL_TABLET | Freq: Four times a day (QID) | ORAL | Status: DC | PRN

## 2022-09-05 MED ORDER — DEXAMETHASONE SODIUM PHOSPHATE 10 MG/ML IJ SOLN
10.0000 mg | Freq: Once | INTRAMUSCULAR | Status: AC
  Administered 2022-09-05: 10 mg via INTRAVENOUS
  Filled 2022-09-05: qty 1

## 2022-09-05 MED ORDER — PREGABALIN 25 MG PO CAPS
150.0000 mg | ORAL_CAPSULE | Freq: Two times a day (BID) | ORAL | Status: DC
  Administered 2022-09-05 – 2022-09-08 (×4): 150 mg via ORAL
  Filled 2022-09-05 (×5): qty 2

## 2022-09-05 MED ORDER — IPRATROPIUM-ALBUTEROL 0.5-2.5 (3) MG/3ML IN SOLN
3.0000 mL | Freq: Once | RESPIRATORY_TRACT | Status: AC
  Administered 2022-09-05: 3 mL via RESPIRATORY_TRACT
  Filled 2022-09-05: qty 3

## 2022-09-05 MED ORDER — ROSUVASTATIN CALCIUM 5 MG PO TABS
5.0000 mg | ORAL_TABLET | Freq: Every day | ORAL | Status: DC
  Administered 2022-09-05 – 2022-09-07 (×3): 5 mg via ORAL
  Filled 2022-09-05 (×3): qty 1

## 2022-09-05 MED ORDER — PREDNISONE 10 MG PO TABS
10.0000 mg | ORAL_TABLET | Freq: Every day | ORAL | Status: DC
  Administered 2022-09-06 – 2022-09-08 (×3): 10 mg via ORAL
  Filled 2022-09-05 (×3): qty 1

## 2022-09-05 MED ORDER — ALFUZOSIN HCL ER 10 MG PO TB24
10.0000 mg | ORAL_TABLET | Freq: Every day | ORAL | Status: DC
  Administered 2022-09-06 – 2022-09-08 (×3): 10 mg via ORAL
  Filled 2022-09-05 (×4): qty 1

## 2022-09-05 MED ORDER — LEVOTHYROXINE SODIUM 100 MCG PO TABS
100.0000 ug | ORAL_TABLET | Freq: Every day | ORAL | Status: DC
  Administered 2022-09-06 – 2022-09-08 (×3): 100 ug via ORAL
  Filled 2022-09-05 (×3): qty 1

## 2022-09-05 MED ORDER — LOSARTAN POTASSIUM 25 MG PO TABS
25.0000 mg | ORAL_TABLET | Freq: Every day | ORAL | Status: DC
  Administered 2022-09-06: 25 mg via ORAL
  Filled 2022-09-05: qty 1

## 2022-09-05 MED ORDER — ONDANSETRON HCL 4 MG/2ML IJ SOLN: 4.0000 mg | Freq: Four times a day (QID) | INTRAMUSCULAR | Status: DC | PRN

## 2022-09-05 MED ORDER — ACETAMINOPHEN 650 MG RE SUPP: 650.0000 mg | Freq: Four times a day (QID) | RECTAL | Status: DC | PRN

## 2022-09-05 MED ORDER — ONDANSETRON HCL 4 MG PO TABS: 4.0000 mg | ORAL_TABLET | Freq: Four times a day (QID) | ORAL | Status: DC | PRN

## 2022-09-05 MED ORDER — FLUDROCORTISONE ACETATE 0.1 MG PO TABS
0.1000 mg | ORAL_TABLET | Freq: Every day | ORAL | Status: DC
  Administered 2022-09-06 – 2022-09-08 (×3): 0.1 mg via ORAL
  Filled 2022-09-05 (×3): qty 1

## 2022-09-05 MED ORDER — AMLODIPINE BESYLATE 5 MG PO TABS: 5.0000 mg | ORAL_TABLET | Freq: Every day | ORAL | Status: DC

## 2022-09-05 MED ORDER — VALACYCLOVIR HCL 500 MG PO TABS
1000.0000 mg | ORAL_TABLET | Freq: Every day | ORAL | Status: DC
  Administered 2022-09-08: 1000 mg via ORAL
  Filled 2022-09-05 (×2): qty 2

## 2022-09-05 MED ORDER — FUROSEMIDE 10 MG/ML IJ SOLN
40.0000 mg | Freq: Once | INTRAMUSCULAR | Status: AC
  Administered 2022-09-05: 40 mg via INTRAVENOUS
  Filled 2022-09-05: qty 4

## 2022-09-05 MED ORDER — POLYETHYLENE GLYCOL 3350 17 G PO PACK
17.0000 g | PACK | Freq: Two times a day (BID) | ORAL | Status: AC
  Filled 2022-09-05: qty 1

## 2022-09-05 NOTE — Assessment & Plan Note (Signed)
Type as symptoms present right.  In the setting of severe bradycardia.  2+ to 3+ pitting bilateral lower extremity swelling.  Chest x-ray showing vascular congestion.  BNP elevated at 394.  Last echo 2022, EF 55 to 60%, indeterminate LV diastolic parameters. -Follow-up echo -Lasix 40 mg x 1 given in ED, per cardiology notes evaluate for additional doses tomorrow.

## 2022-09-05 NOTE — ED Triage Notes (Signed)
Pt here from home with c/o  bil leg swelling and decease urine out put , pt was seen at his pcp was was started on a new b/p meds

## 2022-09-05 NOTE — Assessment & Plan Note (Signed)
Currently bradycardic. -Hold Eliquis.

## 2022-09-05 NOTE — Assessment & Plan Note (Signed)
Hold Eliquis pending EP/cardiology evaluation.

## 2022-09-05 NOTE — ED Notes (Signed)
ED TO INPATIENT HANDOFF REPORT  ED Nurse Name and Phone #: Wilkie Aye [phone]  S Name/Age/Gender Roy [name] 72 y.o. male Room/Bed: APA14/APA14  Code Status   Code Status: Prior  Home/SNF/Other Home Patient oriented to: self, place, time, and situation Is this baseline? Yes   Triage Complete: Triage complete  Chief Complaint Bradycardia [R00.1] Symptomatic bradycardia [R00.1]  Triage Note Pt here from home with c/o  bil leg swelling and decease urine out put , pt was seen at his pcp was was started on a new b/p meds    Allergies Allergies  Allergen Reactions   Adhesive [Tape]     Contact dermatitis   Sulfa Antibiotics     UNKNOWN Childhood reaction    Level of Care/Admitting Diagnosis ED Disposition     ED Disposition  Admit   Condition  --   Comment  Hospital Area: [facility] [[id number]]  Level of Care: Progressive [102]  Admit to Progressive based on following criteria: CARDIOVASCULAR & THORACIC of moderate stability with acute coronary syndrome symptoms/low risk myocardial infarction/hypertensive urgency/arrhythmias/heart failure potentially compromising stability and stable post cardiovascular intervention patients.  May admit patient to [facility] or [facility] if equivalent level of care is available:: No  Covid Evaluation: Asymptomatic - no recent exposure (last 10 days) testing not required  Diagnosis: Symptomatic bradycardia [654052]  Admitting Physician: Onnie Boer [[id number]]  Attending Physician: Onnie Boer [phone]  Certification:: I certify this patient will need inpatient services for at least 2 midnights          B Medical/Surgery History Past Medical History:  Diagnosis Date   Atrial fibrillation (HCC)    Cancer (HCC)    Diffuse Large B-Cell lymphoma   GERD (gastroesophageal reflux disease)    High cholesterol    Hypertension    Obesity    Past Surgical History:  Procedure Laterality Date    APPENDECTOMY     CATARACT EXTRACTION Bilateral 2022   CHOLECYSTECTOMY     ESOPHAGEAL DILATION     multiple times   IR IMAGING GUIDED PORT INSERTION  03/31/2021   JOINT REPLACEMENT Left    hip     A IV Location/Drains/Wounds Patient Lines/Drains/Airways Status     Active Line/Drains/Airways     Name Placement date Placement time Site Days   Implanted Port 03/31/21 Right Chest 03/31/21  1210  Chest  523   Peripheral IV 09/05/22 20 G Anterior;Distal;Right;Upper Arm 09/05/22  1219  Arm  less than 1            Intake/Output Last 24 hours No intake or output data in the 24 hours ending 09/05/22 1701  Labs/Imaging Results for orders placed or performed during the hospital encounter of 09/05/22 (from the past 48 hour(s))  Basic metabolic panel     Status: Abnormal   Collection Time: 09/05/22 12:19 PM  Result Value Ref Range   Sodium 136 135 - 145 mmol/L   Potassium 4.4 3.5 - 5.1 mmol/L   Chloride 101 98 - 111 mmol/L   CO2 27 22 - 32 mmol/L   Glucose, Bld 127 (H) 70 - 99 mg/dL    Comment: Glucose reference range applies only to samples taken after fasting for at least 8 hours.   BUN 17 8 - 23 mg/dL   Creatinine, Ser 0.86 0.61 - 1.24 mg/dL   Calcium 9.2 8.9 - 57.8 mg/dL   GFR, Estimated >46 >96 mL/min    Comment: (NOTE) Calculated using the CKD-EPI  Creatinine Equation (2021)    Anion gap 8 5 - 15    Comment: Performed at [facility], [address], [place], [place] [address]  Brain natriuretic peptide     Status: Abnormal   Collection Time: 09/05/22 12:19 PM  Result Value Ref Range   B Natriuretic Peptide 394.0 (H) 0.0 - 100.0 pg/mL    Comment: Performed at [facility], [address], [place], [place] [address]  CBC with Differential     Status: Abnormal   Collection Time: 09/05/22 12:19 PM  Result Value Ref Range   WBC 9.2 4.0 - 10.5 K/uL   RBC 4.84 4.22 - 5.81 MIL/uL   Hemoglobin 14.9 13.0 - 17.0 g/dL   HCT 09.8 11.9 - 14.7 %   MCV 95.2 80.0 - 100.0 fL   MCH  30.8 26.0 - 34.0 pg   MCHC 32.3 30.0 - 36.0 g/dL   RDW 82.9 (H) 56.2 - 13.0 %   Platelets 164 150 - 400 K/uL   nRBC 0.4 (H) 0.0 - 0.2 %   Neutrophils Relative % 79 %   Neutro Abs 7.3 1.7 - 7.7 K/uL   Lymphocytes Relative 12 %   Lymphs Abs 1.1 0.7 - 4.0 K/uL   Monocytes Relative 6 %   Monocytes Absolute 0.5 0.1 - 1.0 K/uL   Eosinophils Relative 1 %   Eosinophils Absolute 0.1 0.0 - 0.5 K/uL   Basophils Relative 1 %   Basophils Absolute 0.1 0.0 - 0.1 K/uL   Immature Granulocytes 1 %   Abs Immature Granulocytes 0.07 0.00 - 0.07 K/uL    Comment: Performed at [facility], [address], [place], [place] [address]  Troponin I (High Sensitivity)     Status: Abnormal   Collection Time: 09/05/22 12:19 PM  Result Value Ref Range   Troponin I (High Sensitivity) 23 (H) <18 ng/L    Comment: (NOTE) Elevated high sensitivity troponin I (hsTnI) values and significant  changes across serial measurements may suggest ACS but many other  chronic and acute conditions are known to elevate hsTnI results.  Refer to the "Links" section for chest pain algorithms and additional  guidance. Performed at [facility], [address] [place], [place] [address]   SARS Coronavirus 2 by RT PCR (hospital order, performed in [facility] hospital lab) *cepheid single result test* Anterior Nasal Swab     Status: None   Collection Time: 09/05/22 12:32 PM   Specimen: Anterior Nasal Swab  Result Value Ref Range   SARS Coronavirus 2 by RT PCR NEGATIVE NEGATIVE    Comment: (NOTE) SARS-CoV-2 target nucleic acids are NOT DETECTED.  The SARS-CoV-2 RNA is generally detectable in upper and lower respiratory specimens during the acute phase of infection. The lowest concentration of SARS-CoV-2 viral copies this assay can detect is 250 copies / mL. A negative result does not preclude SARS-CoV-2 infection and should not be used as the sole basis for treatment or other patient management decisions.  A negative result may  occur with improper specimen collection / handling, submission of specimen other than nasopharyngeal swab, presence of viral mutation(s) within the areas targeted by this assay, and inadequate number of viral copies (<250 copies / mL). A negative result must be combined with clinical observations, patient history, and epidemiological information.  Fact Sheet for Patients:   RoadLapTop.co.za  Fact Sheet for Healthcare Providers: http://kim-miller.com/  This test is not yet approved or  cleared by the [place] FDA and has been authorized for detection  and/or diagnosis of SARS-CoV-2 by FDA under an Emergency Use Authorization (EUA).  This EUA will remain in effect (meaning this test can be used) for the duration of the COVID-19 declaration under Section 564(b)(1) of the Act, 21 U.S.C. section 360bbb-3(b)(1), unless the authorization is terminated or revoked sooner.  Performed at [facility], [address] [place], [place] [address]   CBG monitoring, ED     Status: Abnormal   Collection Time: 09/05/22 12:45 PM  Result Value Ref Range   Glucose-Capillary 130 (H) 70 - 99 mg/dL    Comment: Glucose reference range applies only to samples taken after fasting for at least 8 hours.  Troponin I (High Sensitivity)     Status: Abnormal   Collection Time: 09/05/22  2:22 PM  Result Value Ref Range   Troponin I (High Sensitivity) 19 (H) <18 ng/L    Comment: (NOTE) Elevated high sensitivity troponin I (hsTnI) values and significant  changes across serial measurements may suggest ACS but many other  chronic and acute conditions are known to elevate hsTnI results.  Refer to the "Links" section for chest pain algorithms and additional  guidance. Performed at [facility], [address] [place], [place] [address]   TSH     Status: None   Collection Time: 09/05/22  2:22 PM  Result Value Ref Range   TSH 1.541 0.350 - 4.500 uIU/mL    Comment:  Performed by a 3rd Generation assay with a functional sensitivity of <=0.01 uIU/mL. Performed at [facility], [address] [place], [place] [address]   Urinalysis, w/ Reflex to Culture (Infection Suspected) -Urine, Clean Catch     Status: Abnormal   Collection Time: 09/05/22  3:45 PM  Result Value Ref Range   Specimen Source URINE, CLEAN CATCH    Color, Urine STRAW (A) YELLOW   APPearance CLEAR CLEAR   Specific Gravity, Urine 1.005 1.005 - 1.030   pH 5.0 5.0 - 8.0   Glucose, UA NEGATIVE NEGATIVE mg/dL   Hgb urine dipstick NEGATIVE NEGATIVE   Bilirubin Urine NEGATIVE NEGATIVE   Ketones, ur NEGATIVE NEGATIVE mg/dL   Protein, ur NEGATIVE NEGATIVE mg/dL   Nitrite NEGATIVE NEGATIVE   Leukocytes,Ua NEGATIVE NEGATIVE   RBC / HPF 0-5 0 - 5 RBC/hpf   WBC, UA 0-5 0 - 5 WBC/hpf    Comment:        Reflex urine culture not performed if WBC <=10, OR if Squamous epithelial cells >5. If Squamous epithelial cells >5 suggest recollection.    Bacteria, UA NONE SEEN NONE SEEN   Squamous Epithelial / HPF 0-5 0 - 5 /HPF    Comment: Performed at [facility], [address], [place], [place] [address]   DG Chest 2 View  Result Date: 09/05/2022 CLINICAL DATA:  Shortness of breath EXAM: CHEST - 2 VIEW COMPARISON:  X-ray 08/26/2022 FINDINGS: Enlarged cardiopericardial silhouette. Calcified aorta. Vascular congestion. No pneumothorax, effusion or edema. Right IJ chest port with tip along the central SVC above the right atrium. Film is under penetrated degenerative changes of the spine. Surgical clips in the right upper abdomen IMPRESSION: Right IJ chest port. Enlarged cardiopericardial silhouette with a vascular congestion. Electronically Signed   By: Karen Kays M.D.   On: 09/05/2022 12:46    Pending Labs Unresulted Labs (From admission, onward)    None       Vitals/Pain Today's Vitals   09/05/22 1430 09/05/22 1500 09/05/22 1545 09/05/22 1623  BP: 126/72 132/62  131/78  Pulse: Marland Kitchen)  32 63 (!) 32  (!) 32  Resp: 18 14  20   Temp:    97.8 F (36.6 C)  TempSrc:    Oral  SpO2: 93% 94% 96% 97%  PainSc:        Isolation Precautions No active isolations  Medications Medications  ipratropium-albuterol (DUONEB) 0.5-2.5 (3) MG/3ML nebulizer solution 3 mL (3 mLs Nebulization Given 09/05/22 1304)  dexamethasone (DECADRON) injection 10 mg (10 mg Intravenous Given 09/05/22 1250)  furosemide (LASIX) injection 40 mg (40 mg Intravenous Given 09/05/22 1622)    Mobility walks     Focused Assessments Cardiac Assessment Handoff:    Lab Results  Component Value Date   CKTOTAL 94 07/13/2021   TROPONINI <0.03 03/26/2018   No results found for: "DDIMER" Does the Patient currently have chest pain? No    R Recommendations: See Admitting Provider Note  Report given to:   Additional Notes: Sinus Huston Foley

## 2022-09-05 NOTE — Assessment & Plan Note (Signed)
Heart rate in the 30s.  Blood pressure stable 120s to 140s.  History of conduction disease with trifascicular block back in 2016.  Not on AV nodal agents.  Now in heart failure. -Evaluated by cardiology, admit to [facility], EP evaluation, likely needs pacemaker, does not require chronotropic drip or temporary pacing. -Hold Eliquis, last dose this morning, 5/1 

## 2022-09-05 NOTE — ED Provider Notes (Signed)
[facility]MENT AT [facility] Provider Note   CSN: [id number] Arrival date & time: 09/05/22  1120     History  Chief Complaint  Patient presents with   Weakness    Roy [name] is a 72 y.o. male with PMH a-fib, HTN, HLD, GERD presents to ED c/o lower extremity swelling for ~1 week. Notes this started after being started on levocetirizine 5mg .  He states that he was started on new blood pressure medications but when going through his medications for blood pressure and he does not state that any of these are needed.  He states he has felt generally weak and fatigued with some cough and congestion recently and an itchy throat this morning but otherwise has no acute symptoms.  He denies chest pain, shortness of breath, abdominal pain, nausea, vomiting, diarrhea, or other complaints today.      Home Medications Prior to Admission medications   Medication Sig Start Date End Date Taking? Authorizing Provider  acetaminophen (TYLENOL) 325 MG tablet Take 2 tablets (650 mg total) by mouth every 6 (six) hours as needed for headache. 01/13/21  Yes Emokpae, Courage, MD  alfuzosin (UROXATRAL) 10 MG 24 hr tablet TAKE ONE TABLET DAILY WITH BREAKFAST 02/01/22  Yes Bjorn Pippin, MD  amLODipine (NORVASC) 5 MG tablet Take 5 mg by mouth daily.   Yes [provider]  ELIQUIS 5 MG TABS tablet Take 5 mg by mouth 2 (two) times daily. 08/03/21  Yes [provider]  EQUATE STOOL SOFTENER 100 MG capsule Take 2 capsules by mouth twice daily 08/13/21  Yes Pennington, Rebekah M, PA-C  fludrocortisone (FLORINEF) 0.1 MG tablet TAKE ONE TABLET ONCE DAILY WITH BREAKFAST 06/15/22  Yes Nida, Denman George, MD  levocetirizine (XYZAL) 5 MG tablet Take 5 mg by mouth every evening. 08/30/22  Yes [provider]  levothyroxine (SYNTHROID) 100 MCG tablet TAKE ONE TABLET ONCE DAILY BEFORE BREAKFAST 06/07/22  Yes Nida, Denman George, MD  losartan (COZAAR) 25 MG tablet Take 25 mg by  mouth daily. 08/30/22  Yes [provider]  magnesium oxide (MAG-OX) 400 (240 Mg) MG tablet Take 400 mg by mouth daily.   Yes [provider]  omeprazole (PRILOSEC) 10 MG capsule Take 20 mg by mouth daily. 10/26/21  Yes [provider]  potassium chloride (KLOR-CON) 10 MEQ tablet TAKE TWO TABLETS TWICE DAILY 06/19/22  Yes Nida, Denman George, MD  predniSONE (DELTASONE) 10 MG tablet TAKE ONE TABLET ONCE DAILY WITH BREAKFAST 05/29/22  Yes Nida, Denman George, MD  pregabalin (LYRICA) 150 MG capsule Take 150 mg by mouth 2 (two) times daily. 08/14/22  Yes [provider]  rosuvastatin (CRESTOR) 5 MG tablet Take 1 tablet by mouth at bedtime. 08/23/20  Yes [provider]  valACYclovir (VALTREX) 1000 MG tablet Take 1,000 mg by mouth daily.   Yes [provider]  vitamin B-12 (CYANOCOBALAMIN) 1000 MCG tablet Take 1 tablet (1,000 mcg total) by mouth daily. 02/28/21  Yes Pennington, Rebekah M, PA-C  cefpodoxime (VANTIN) 200 MG tablet Take 1 tablet (200 mg total) by mouth 2 (two) times daily. Patient not taking: Reported on 09/05/2022 07/06/22   Pauline Aus, PA-C  FEROSUL 325 (65 Fe) MG tablet Take 1 tablet by mouth once daily with breakfast Patient not taking: Reported on 09/05/2022 07/30/21   Carnella Guadalajara, PA-C      Allergies    Adhesive [tape] and Sulfa antibiotics    Review of Systems   Review of Systems  All other systems reviewed and are negative.   Physical Exam Updated Vital Signs BP 120/67   Pulse (!) 32   Temp 98 F (36.7 C)   Resp 14   SpO2 92%  Physical Exam Vitals and nursing note reviewed.  Constitutional:      General: He is not in acute distress.    Appearance: Normal appearance. He is not ill-appearing or toxic-appearing.  HENT:     Head: Normocephalic and atraumatic.     Nose: Congestion present.     Mouth/Throat:     Mouth: Mucous membranes are moist.     Pharynx: Oropharynx is clear. No oropharyngeal exudate or  posterior oropharyngeal erythema.  Eyes:     General: No scleral icterus.    Extraocular Movements: Extraocular movements intact.     Conjunctiva/sclera: Conjunctivae normal.     Pupils: Pupils are equal, round, and reactive to light.  Cardiovascular:     Rate and Rhythm: Bradycardia present. Rhythm irregular.     Heart sounds: No murmur heard. Pulmonary:     Effort: Pulmonary effort is normal. No respiratory distress.     Breath sounds: No stridor. Wheezing (diffuse expiratory) present. No rhonchi or rales.  Chest:     Chest wall: No tenderness.  Abdominal:     General: Abdomen is flat. There is no distension.     Palpations: Abdomen is soft.     Tenderness: There is no abdominal tenderness. There is no right CVA tenderness, left CVA tenderness, guarding or rebound.  Musculoskeletal:        General: Normal range of motion.     Cervical back: Normal range of motion and neck supple. No rigidity.     Right lower leg: Edema (1+ pitting) present.     Left lower leg: Edema (1+ pitting) present.  Lymphadenopathy:     Cervical: No cervical adenopathy.  Skin:    General: Skin is warm and dry.     Capillary Refill: Capillary refill takes less than 2 seconds.  Neurological:     General: No focal deficit present.     Mental Status: He is alert and oriented to person, place, and time.     Cranial Nerves: No cranial nerve deficit.     Motor: No weakness.  Psychiatric:        Mood and Affect: Mood normal.        Behavior: Behavior normal.     ED Results / Procedures / Treatments   Labs (all labs ordered are listed, but only abnormal results are displayed) Labs Reviewed  BASIC METABOLIC PANEL - Abnormal; Notable for the following components:      Result Value   Glucose, Bld 127 (*)    All other components within normal limits  BRAIN NATRIURETIC PEPTIDE - Abnormal; Notable for the following components:   B Natriuretic Peptide 394.0 (*)    All other components within normal limits   CBC WITH DIFFERENTIAL/PLATELET - Abnormal; Notable for the following components:   RDW 15.8 (*)    nRBC 0.4 (*)    All other components within normal limits  CBG MONITORING, ED - Abnormal; Notable for the following components:   Glucose-Capillary 130 (*)    All other components within normal limits  TROPONIN I (HIGH SENSITIVITY) - Abnormal; Notable for the following components:   Troponin I (High Sensitivity) 23 (*)    All other components within normal limits  TROPONIN I (HIGH SENSITIVITY) - Abnormal; Notable for the following components:   Troponin  I (High Sensitivity) 19 (*)    All other components within normal limits  SARS CORONAVIRUS 2 BY RT PCR  TSH  URINALYSIS, W/ REFLEX TO CULTURE (INFECTION SUSPECTED)    EKG EKG Interpretation  Date/Time:  Wednesday Sep 05 2022 12:48:40 EDT Ventricular Rate:  34 PR Interval:    QRS Duration: 175 QT Interval:  572 QTC Calculation: 431 R Axis:   -46 Text Interpretation: Junctional rhythm vs atrial fib RBBB and LAFB LVH with secondary repolarization abnormality continued bradycardia Confirmed by Meridee Score [phone]) on 09/05/2022 12:54:42 PM  Radiology DG Chest 2 View  Result Date: 09/05/2022 CLINICAL DATA:  Shortness of breath EXAM: CHEST - 2 VIEW COMPARISON:  X-ray 08/26/2022 FINDINGS: Enlarged cardiopericardial silhouette. Calcified aorta. Vascular congestion. No pneumothorax, effusion or edema. Right IJ chest port with tip along the central SVC above the right atrium. Film is under penetrated degenerative changes of the spine. Surgical clips in the right upper abdomen IMPRESSION: Right IJ chest port. Enlarged cardiopericardial silhouette with a vascular congestion. Electronically Signed   By: Karen Kays M.D.   On: 09/05/2022 12:46    Procedures Procedures    Medications Ordered in ED Medications  ipratropium-albuterol (DUONEB) 0.5-2.5 (3) MG/3ML nebulizer solution 3 mL (3 mLs Nebulization Given 09/05/22 1304)  dexamethasone  (DECADRON) injection 10 mg (10 mg Intravenous Given 09/05/22 1250)    ED Course/ Medical Decision Making/ A&P Clinical Course as of 09/05/22 1610  Wed Sep 05, 2022  2065 72 year old male with history of A-fib here with general weakness edema on his legs decreased urine output.  Has been persistently bradycardic in the 30s although blood pressure okay.  Lab work with probably some element of CHF.  Likely may benefit from this from admission for diuresis and further workup if he has any conduction disturbance. [MB]    Clinical Course User Index [MB] Terrilee Files, MD                             Medical Decision Making Amount and/or Complexity of Data Reviewed Labs: ordered. Decision-making details documented in ED Course. Radiology: ordered. Decision-making details documented in ED Course. ECG/medicine tests: ordered. Decision-making details documented in ED Course.  Risk Prescription drug management. Decision regarding hospitalization.   Medical Decision Making:   Roy [name] is a 72 y.o. male who presented to the ED today with edema, weakness, cough detailed above.    Patient's presentation is complicated by their history of a-fib, HLD, HTN, obesity.  Patient placed on continuous vitals and telemetry monitoring while in ED which was reviewed periodically.  Complete initial physical exam performed, notably the patient was in no acute distress.  Bradycardic with a regular rhythm.  1+ pitting edema to bilateral lower extremities.  Abdomen soft and nontender.  Diffuse expiratory wheezing throughout.  Patent airway.  No signs of respiratory distress.  Neurologically intact.    Reviewed and confirmed nursing documentation for past medical history, family history, social history.    Initial Assessment:   With the patient's presentation, differential diagnosis includes but is not limited to ACS, electrolyte disturbance, arrhythmia, acute kidney injury, renal failure, viral syndrome,  pneumonia,  heart failure exacerbation, pulmonary edema.  This is most consistent with an acute complicated illness  Initial Plan:  Screening labs including CBC and Metabolic panel to evaluate for infectious or metabolic etiology of disease.  CXR to evaluate for structural/infectious intrathoracic pathology.  EKG and troponin to evaluate  for cardiac pathology BNP to evaluate for fluid overload COVID swab to evaluate for viral etiology Symptomatic management Objective evaluation as below reviewed   Initial Study Results:   Laboratory  All laboratory results reviewed without evidence of clinically relevant pathology.   Exceptions include: BNP 394, troponin 23  EKG EKG was reviewed independently. Bradycardia on EKG. No acute ST changes.   Radiology:  All images reviewed independently. Agree with radiology report at this time.   DG Chest 2 View  Result Date: 09/05/2022 CLINICAL DATA:  Shortness of breath EXAM: CHEST - 2 VIEW COMPARISON:  X-ray 08/26/2022 FINDINGS: Enlarged cardiopericardial silhouette. Calcified aorta. Vascular congestion. No pneumothorax, effusion or edema. Right IJ chest port with tip along the central SVC above the right atrium. Film is under penetrated degenerative changes of the spine. Surgical clips in the right upper abdomen IMPRESSION: Right IJ chest port. Enlarged cardiopericardial silhouette with a vascular congestion. Electronically Signed   By: Karen Kays M.D.   On: 09/05/2022 12:46   DG Chest Port 1 View  Result Date: 08/26/2022 CLINICAL DATA:  Shortness of breath and wheezing.  Productive cough. EXAM: PORTABLE CHEST 1 VIEW COMPARISON:  07/13/2021 FINDINGS: Right-sided power port remains in appropriate position. Mild cardiomegaly stable. Both lungs are clear. IMPRESSION: Mild cardiomegaly. No active lung disease. Electronically Signed   By: Danae Orleans M.D.   On: 08/26/2022 08:05      Consults: Case discussed with Dr. Wyline Mood who will follow patient and  recommends admission to [facility].  Case discussed with hospitalist who will admit patient and get him transferred over to [facility].  Final Assessment and Plan:   72 year old male presents to the ED for bilateral lower extremity edema and generalized weakness.  On exam, he has wheezing throughout, nasal congestion, as well as pitting edema bilaterally.  No calf tenderness.  No significant overlying skin changes.  He is bradycardic with an irregular rhythm.  Patient states that he is not followed by cardiology.  On chart review, it does appear that patient has been seen by cardiology before the last visit was in 2022.  With previous visits, patient typically has a bradycardia in the 40s to 50s but has remained bradycardic around 30 bpm today.  He is in A-fib as well but does have a history of chronic A-fib.  He has anticoagulated at home.  Breathing treatment given with wheezing on exam with resolution of this.  COVID swab negative.  Blood work shows a BNP at 394 and troponin at 23.  Normal CBC.  Normal electrolytes.  Chest x-ray with some vascular congestion but no consolidations.  Discussed case with on-call cardiologist with patient's persistent unexplained bradycardia.  Not on any AV nodal blocking agents to explain this currently.  With this, will have patient admitted to medicine service at [facility] for likely pacemaker placement.  Hospitalist consulted and will arrange transfer and admission.  Patient is stable at time of disposition.   Clinical Impression:  1. Bradycardia   2. Weakness   3. Wheezing   4. Chronic atrial fibrillation [facility])      Admit           Final Clinical Impression(s) / ED Diagnoses Final diagnoses:  Bradycardia  Weakness  Wheezing  Chronic atrial fibrillation [facility])    Rx / DC Orders ED Discharge Orders     None         Richardson Dopp 09/05/22 1617    Terrilee Files, MD 09/05/22  1727  

## 2022-09-05 NOTE — H&P (Signed)
History and Physical    Roy SLEIGHT ZOX:[id number] DOB: 01/04/1951 DOA: 09/05/2022  PCP: Rebecka Apley, NP  Patient coming from: Home  I have personally briefly reviewed patient's old medical records in [facility]  Chief Complaint: Leg swelling  HPI: Roy [name] is a 72 y.o. male with medical history significant for hypertension, B-cell lymphoma, paroxysmal atrial fibrillation on chronic anticoagulation with Eliquis. Patient presented to the ED with complaints of bilateral lower extremity swelling up to his thighs over the past week, generalized weakness and fatigue.  Reports abdominal bloating, but is constipated also.  No chest pain.  No difficulty breathing.  Never smoked cigarettes, does not drink alcohol.  About a week ago he had cough and congestion and was prescribed a course of steroids and is improving from the standpoint.  ED Course: Heart rate mostly 32-36.  Temperature 98.  Respirate rate 14-27.  Blood pressure systolic 120s to 811B.  Sats 91 to 96% on room air. BNP elevated at 394.  Chest x-ray shows enlarged cardiopericardial silhouette, with vascular congestion.  EKG shows junctional rhythm versus atrial fibrillation rate of 34.  TSH 1.5.  Troponin 23> 19. Cardiology was consulted, recommended admission to [facility], for EP eval for possible pacemaker.  No need for chronotropic drips of temporary pacing at this time.  Review of Systems: As per HPI all other systems reviewed and negative.  Past Medical History:  Diagnosis Date   Atrial fibrillation (HCC)    Cancer (HCC)    Diffuse Large B-Cell lymphoma   GERD (gastroesophageal reflux disease)    High cholesterol    Hypertension    Obesity     Past Surgical History:  Procedure Laterality Date   APPENDECTOMY     CATARACT EXTRACTION Bilateral 2022   CHOLECYSTECTOMY     ESOPHAGEAL DILATION     multiple times   IR IMAGING GUIDED PORT INSERTION  03/31/2021   JOINT REPLACEMENT Left    hip      reports that he has never smoked. He has never been exposed to tobacco smoke. He has never used smokeless tobacco. He reports that he does not drink alcohol and does not use drugs.  Allergies  Allergen Reactions   Adhesive [Tape]     Contact dermatitis   Sulfa Antibiotics     UNKNOWN Childhood reaction    Family History  Problem Relation Age of Onset   Heart failure Father    Heart attack Father        Deceased    Thyroid disease Sister     Prior to Admission medications   Medication Sig Start Date End Date Taking? Authorizing Provider  acetaminophen (TYLENOL) 325 MG tablet Take 2 tablets (650 mg total) by mouth every 6 (six) hours as needed for headache. 01/13/21  Yes Harlo Fabela, Courage, MD  alfuzosin (UROXATRAL) 10 MG 24 hr tablet TAKE ONE TABLET DAILY WITH BREAKFAST 02/01/22  Yes Bjorn Pippin, MD  amLODipine (NORVASC) 5 MG tablet Take 5 mg by mouth daily.   Yes [provider]  ELIQUIS 5 MG TABS tablet Take 5 mg by mouth 2 (two) times daily. 08/03/21  Yes [provider]  EQUATE STOOL SOFTENER 100 MG capsule Take 2 capsules by mouth twice daily 08/13/21  Yes Pennington, Rebekah M, PA-C  fludrocortisone (FLORINEF) 0.1 MG tablet TAKE ONE TABLET ONCE DAILY WITH BREAKFAST 06/15/22  Yes Nida, Denman George, MD  levocetirizine (XYZAL) 5 MG tablet Take 5 mg by mouth every evening. 08/30/22  Yes [provider]  levothyroxine (SYNTHROID) 100 MCG tablet TAKE ONE TABLET ONCE DAILY BEFORE BREAKFAST 06/07/22  Yes Nida, Denman George, MD  losartan (COZAAR) 25 MG tablet Take 25 mg by mouth daily. 08/30/22  Yes [provider]  magnesium oxide (MAG-OX) 400 (240 Mg) MG tablet Take 400 mg by mouth daily.   Yes [provider]  omeprazole (PRILOSEC) 10 MG capsule Take 20 mg by mouth daily. 10/26/21  Yes [provider]  potassium chloride (KLOR-CON) 10 MEQ tablet TAKE TWO TABLETS TWICE DAILY 06/19/22  Yes Nida, Denman George, MD  predniSONE (DELTASONE) 10  MG tablet TAKE ONE TABLET ONCE DAILY WITH BREAKFAST 05/29/22  Yes Nida, Denman George, MD  pregabalin (LYRICA) 150 MG capsule Take 150 mg by mouth 2 (two) times daily. 08/14/22  Yes [provider]  rosuvastatin (CRESTOR) 5 MG tablet Take 1 tablet by mouth at bedtime. 08/23/20  Yes [provider]  valACYclovir (VALTREX) 1000 MG tablet Take 1,000 mg by mouth daily.   Yes [provider]  vitamin B-12 (CYANOCOBALAMIN) 1000 MCG tablet Take 1 tablet (1,000 mcg total) by mouth daily. 02/28/21  Yes Pennington, Rebekah M, PA-C  cefpodoxime (VANTIN) 200 MG tablet Take 1 tablet (200 mg total) by mouth 2 (two) times daily. Patient not taking: Reported on 09/05/2022 07/06/22   Pauline Aus, PA-C  FEROSUL 325 (65 Fe) MG tablet Take 1 tablet by mouth once daily with breakfast Patient not taking: Reported on 09/05/2022 07/30/21   Carnella Guadalajara, PA-C    Physical Exam: Vitals:   09/05/22 1402 09/05/22 1430 09/05/22 1500 09/05/22 1545  BP:  126/72 132/62   Pulse: (!) 32 (!) 32 63 (!) 32  Resp: 14 18 14    Temp:      SpO2: 92% 93% 94% 96%    Constitutional: NAD, calm, comfortable Vitals:   09/05/22 1402 09/05/22 1430 09/05/22 1500 09/05/22 1545  BP:  126/72 132/62   Pulse: (!) 32 (!) 32 63 (!) 32  Resp: 14 18 14    Temp:      SpO2: 92% 93% 94% 96%   Eyes: PERRL, lids and conjunctivae normal ENMT: Mucous membranes are moist. Neck: normal, supple, no masses, no thyromegaly Respiratory: clear to auscultation bilaterally, no wheezing, no crackles. Normal respiratory effort. No accessory muscle use.  Cardiovascular: Bradycardic rate, 2/6 systolic murmur, no rubs / gallops.  At least 2+ probably up to 3+ pitting bilateral lower extremity edema worse on the left,  Abdomen: Full, no tenderness, no masses palpated. No hepatosplenomegaly.  Musculoskeletal: no clubbing / cyanosis. No joint deformity upper and lower extremities.  Skin: no rashes, lesions, ulcers. No  induration Neurologic: No apparent cranial nerve abnormalities, moving extremities spontaneously Psychiatric: Normal judgment and insight. Alert and oriented x 3. Normal mood.   Labs on Admission: I have personally reviewed following labs and imaging studies  CBC: Recent Labs  Lab 09/05/22 1219  WBC 9.2  NEUTROABS 7.3  HGB 14.9  HCT 46.1  MCV 95.2  PLT 164   Basic Metabolic Panel: Recent Labs  Lab 09/05/22 1219  NA 136  K 4.4  CL 101  CO2 27  GLUCOSE 127*  BUN 17  CREATININE 0.85  CALCIUM 9.2   CBG: Recent Labs  Lab 09/05/22 1245  GLUCAP 130*   Thyroid Function Tests: Recent Labs    09/05/22 1422  TSH 1.541   Urine analysis:    Component Value Date/Time   COLORURINE STRAW (A) 09/05/2022 1545   APPEARANCEUR  CLEAR 09/05/2022 1545   APPEARANCEUR Clear 07/12/2022 1454   LABSPEC 1.005 09/05/2022 1545   PHURINE 5.0 09/05/2022 1545   GLUCOSEU NEGATIVE 09/05/2022 1545   HGBUR NEGATIVE 09/05/2022 1545   BILIRUBINUR NEGATIVE 09/05/2022 1545   BILIRUBINUR Negative 07/12/2022 1454   KETONESUR NEGATIVE 09/05/2022 1545   PROTEINUR NEGATIVE 09/05/2022 1545   UROBILINOGEN 1.0 05/15/2010 2059   NITRITE NEGATIVE 09/05/2022 1545   LEUKOCYTESUR NEGATIVE 09/05/2022 1545    Radiological Exams on Admission: DG Chest 2 View  Result Date: 09/05/2022 CLINICAL DATA:  Shortness of breath EXAM: CHEST - 2 VIEW COMPARISON:  X-ray 08/26/2022 FINDINGS: Enlarged cardiopericardial silhouette. Calcified aorta. Vascular congestion. No pneumothorax, effusion or edema. Right IJ chest port with tip along the central SVC above the right atrium. Film is under penetrated degenerative changes of the spine. Surgical clips in the right upper abdomen IMPRESSION: Right IJ chest port. Enlarged cardiopericardial silhouette with a vascular congestion. Electronically Signed   By: Karen Kays M.D.   On: 09/05/2022 12:46    EKG: Independently reviewed.  Rate 34, QTc 431.  Junctional rhythm versus atrial  fibrillation.  Assessment/Plan Principal Problem:   Symptomatic bradycardia Active Problems:   Atrial fibrillation (HCC)   Trifascicular block   Chronic anticoagulation   Acute congestive heart failure (HCC)   Large B-cell lymphoma (HCC)   Essential hypertension   Adrenal insufficiency (HCC)   BPH (benign prostatic hyperplasia)    Assessment and Plan: * Symptomatic bradycardia Heart rate in the 30s.  Blood pressure stable 120s to 140s.  History of conduction disease with trifascicular block back in 2016.  Not on AV nodal agents.  Now in heart failure. -Evaluated by cardiology, admit to [facility], EP evaluation, likely needs pacemaker, does not require chronotropic drip or temporary pacing. -Hold Eliquis, last dose this morning, 5/1  Acute congestive heart failure (HCC) Type as symptoms present right.  In the setting of severe bradycardia.  2+ to 3+ pitting bilateral lower extremity swelling.  Chest x-ray showing vascular congestion.  BNP elevated at 394.  Last echo 2022, EF 55 to 60%, indeterminate LV diastolic parameters. -Follow-up echo -Lasix 40 mg x 1 given in ED, per cardiology notes evaluate for additional doses tomorrow.  Chronic anticoagulation Hold Eliquis pending EP/cardiology evaluation.  Atrial fibrillation (HCC) Currently bradycardic. -Hold Eliquis.  Large B-cell lymphoma (HCC) Follows with Dr. Ellin Saba.  Status post chemotherapy.  Pet scan 09/2021- no evidence of metabolically active lymphoma.  BPH (benign prostatic hyperplasia) Resume alfuzosin  Adrenal insufficiency (HCC) Resume Florinef prednisone 10 mg daily -Holding off on stress dose steroids for now  Essential hypertension Systolic 120s to 161W. -Despite severe bradycardia, blood pressure is stable.  Resume losartan and Norvasc in a.m.   DVT prophylaxis: SCDS Code Status: Full code, confirmed with patient at bedside Family Communication: None at bedside Disposition Plan: > 2 days Consults  called:  Cardiology Admission status:  Inpt, Progressive I certify that at the point of admission it is my clinical judgment that the patient will require inpatient hospital care spanning beyond 2 midnights from the point of admission due to high intensity of service, high risk for further deterioration and high frequency of surveillance required.    Author: Onnie Boer, MD 09/05/2022 5:30 PM  For on call review [email or web address].

## 2022-09-05 NOTE — Assessment & Plan Note (Signed)
Resume Florinef prednisone 10 mg daily -Holding off on stress dose steroids for now

## 2022-09-05 NOTE — Assessment & Plan Note (Signed)
Follows with Dr. Ellin Saba.  Status post chemotherapy.  Pet scan 09/2021- no evidence of metabolically active lymphoma.

## 2022-09-05 NOTE — Assessment & Plan Note (Signed)
Systolic 120s to 161W. -Despite severe bradycardia, blood pressure is stable.  Resume losartan and Norvasc in a.m.

## 2022-09-05 NOTE — Assessment & Plan Note (Signed)
Resume alfuzosin 

## 2022-09-05 NOTE — Consult Note (Signed)
Cardiology Admission History and Physical    Patient ID: [name] MRN: [id number]; DOB: [birth year]    Admission date: 09/05/2022   PCP:  Roy Apley, NP              [facility] Providers Cardiologist:  Tenny Craw       Chief Complaint:  Fatigue   Patient Profile:    Roy [name] is a 72 y.o. male with history of afib who is being seen 09/05/2022 for the evaluation of fatigue and bradycardia.   History of Present Illness:    Roy [name] 72 yo male history of afib not followed by cards since initial eval in 2016, conduction disease with trifascicular block back in 2016, hypothyroidism, obesity,  Stage IV bcell lympoma s/p chemo with recent PET without active diease, adrenal insufficiency on florinef and prednisone,presents with 1 week of leg swelling with generalized fatigue and weakness and dizziness     K 4.4 Cr 0.85 BUN 17 BNP 394 WBC 9.2 Hgb 14.9 Plt 164 TSH 1.5 Trop 23-->19 CXR vasculatr congestion EKG afib, junctional rhythm 30s   2022 echo: LVEF 55-60%, indet diatolic, RV poorly visualized,        Past Medical History:  Diagnosis Date   Atrial fibrillation (HCC)     Cancer (HCC)      Diffuse Large B-Cell lymphoma   GERD (gastroesophageal reflux disease)     High cholesterol     Hypertension     Obesity             Past Surgical History:  Procedure Laterality Date   APPENDECTOMY       CATARACT EXTRACTION Bilateral 2022   CHOLECYSTECTOMY       ESOPHAGEAL DILATION        multiple times   IR IMAGING GUIDED PORT INSERTION   03/31/2021   JOINT REPLACEMENT Left      hip      Medications Prior to Admission:        Prior to Admission medications   Medication Sig Start Date End Date Taking? Authorizing Provider  acetaminophen (TYLENOL) 325 MG tablet Take 2 tablets (650 mg total) by mouth every 6 (six) hours as needed for headache. 01/13/21   Yes Emokpae, Courage, MD  alfuzosin (UROXATRAL) 10 MG 24 hr tablet TAKE ONE TABLET DAILY WITH  BREAKFAST 02/01/22   Yes Bjorn Pippin, MD  amLODipine (NORVASC) 5 MG tablet Take 5 mg by mouth daily.     Yes [provider]  ELIQUIS 5 MG TABS tablet Take 5 mg by mouth 2 (two) times daily. 08/03/21   Yes [provider]  EQUATE STOOL SOFTENER 100 MG capsule Take 2 capsules by mouth twice daily 08/13/21   Yes Pennington, Rebekah M, PA-C  fludrocortisone (FLORINEF) 0.1 MG tablet TAKE ONE TABLET ONCE DAILY WITH BREAKFAST 06/15/22   Yes Nida, Denman George, MD  levocetirizine (XYZAL) 5 MG tablet Take 5 mg by mouth every evening. 08/30/22   Yes [provider]  levothyroxine (SYNTHROID) 100 MCG tablet TAKE ONE TABLET ONCE DAILY BEFORE BREAKFAST 06/07/22   Yes Nida, Denman George, MD  losartan (COZAAR) 25 MG tablet Take 25 mg by mouth daily. 08/30/22   Yes [provider]  magnesium oxide (MAG-OX) 400 (240 Mg) MG tablet Take 400 mg by mouth daily.     Yes [provider]  omeprazole (PRILOSEC) 10 MG capsule Take 20 mg by mouth daily. 10/26/21   Yes [provider]  potassium chloride (KLOR-CON) 10 MEQ tablet TAKE TWO TABLETS TWICE DAILY 06/19/22   Yes Nida, Denman George, MD  predniSONE (DELTASONE) 10 MG tablet TAKE ONE TABLET ONCE DAILY WITH BREAKFAST 05/29/22   Yes Nida, Denman George, MD  pregabalin (LYRICA) 150 MG capsule Take 150 mg by mouth 2 (two) times daily. 08/14/22   Yes [provider]  rosuvastatin (CRESTOR) 5 MG tablet Take 1 tablet by mouth at bedtime. 08/23/20   Yes [provider]  valACYclovir (VALTREX) 1000 MG tablet Take 1,000 mg by mouth daily.     Yes [provider]  vitamin B-12 (CYANOCOBALAMIN) 1000 MCG tablet Take 1 tablet (1,000 mcg total) by mouth daily. 02/28/21   Yes Pennington, Rebekah M, PA-C  cefpodoxime (VANTIN) 200 MG tablet Take 1 tablet (200 mg total) by mouth 2 (two) times daily. Patient not taking: Reported on 09/05/2022 07/06/22     Pauline Aus, PA-C  FEROSUL 325 (65 Fe) MG tablet Take 1  tablet by mouth once daily with breakfast Patient not taking: Reported on 09/05/2022 07/30/21     Carnella Guadalajara, PA-C      Allergies:         Allergies  Allergen Reactions   Adhesive [Tape]        Contact dermatitis   Sulfa Antibiotics        UNKNOWN Childhood reaction      Social History:   Social History         Socioeconomic History   Marital status: Widowed      Spouse name: Not on file   Number of children: 3   Years of education: Not on file   Highest education level: Not on file  Occupational History   Occupation: Retired  Tobacco Use   Smoking status: Never      Passive exposure: Never   Smokeless tobacco: Never  Vaping Use   Vaping Use: Never used  Substance and Sexual Activity   Alcohol use: No   Drug use: No   Sexual activity: Not Currently  Other Topics Concern   Not on file  Social History Narrative   Not on file    Social Determinants of Health        Financial Resource Strain: Low Risk  (11/08/2020)    Overall Financial Resource Strain (CARDIA)     Difficulty of Paying Living Expenses: Not hard at all  Food Insecurity: No Food Insecurity (11/08/2020)    Hunger Vital Sign     Worried About Running Out of Food in the Last Year: Never true     Ran Out of Food in the Last Year: Never true  Transportation Needs: No Transportation Needs (11/08/2020)    PRAPARE - Therapist, art (Medical): No     Lack of Transportation (Non-Medical): No  Physical Activity: Sufficiently Active (11/08/2020)    Exercise Vital Sign     Days of Exercise per Week: 5 days     Minutes of Exercise per Session: 30 min  Stress: No Stress Concern Present (11/08/2020)    [employer] of Occupational Health - Occupational Stress Questionnaire     Feeling of Stress : Not at all  Social Connections: Moderately Isolated (11/08/2020)    Social Connection and Isolation Panel [NHANES]     Frequency of Communication with Friends and Family: More than three  times a week     Frequency of Social Gatherings with Friends and Family: Three times a week  Attends Religious Services: 1 to 4 times per year     Active Member of Clubs or Organizations: No     Attends Banker Meetings: Never     Marital Status: Widowed  Intimate Partner Violence: Not At Risk (11/08/2020)    Humiliation, Afraid, Rape, and Kick questionnaire     Fear of Current or Ex-Partner: No     Emotionally Abused: No     Physically Abused: No     Sexually Abused: No    Family History:   The patient's family history includes Heart attack in his father; Heart failure in his father; Thyroid disease in his sister.     ROS:  Please see the history of present illness.  All other ROS reviewed and negative.      Physical Exam/Data:          Vitals:    09/05/22 1306 09/05/22 1330 09/05/22 1400 09/05/22 1402  BP:   (!) 121/58 120/67    Pulse:   (!) 34 (!) 35 (!) 32  Resp:   14 (!) 24 14  Temp:          SpO2: 91% 90%   92%    No intake or output data in the 24 hours ending 09/05/22 1537     08/26/2022    7:49 AM 07/12/2022    2:51 PM 07/06/2022   12:00 PM  Last 3 Weights  Weight (lbs) 270 lb 282 lb 3.2 oz 282 lb 11.2 oz  Weight (kg) 122.471 kg 128.005 kg 128.232 kg     There is no height or weight on file to calculate BMI.  General:  Well nourished, well developed, in no acute distress HEENT: normal Neck: no JVD Vascular: No carotid bruits; Distal pulses 2+ bilaterally   Cardiac:  regular, brady Lungs:  clear to auscultation bilaterally, no wheezing, rhonchi or rales  Abd: soft, nontender, no hepatomegaly  Ext: 2+bilateral LE edema Musculoskeletal:  No deformities, BUE and BLE strength normal and equal Skin: warm and dry  Neuro:  CNs 2-12 intact, no focal abnormalities noted Psych:  Normal affect        Laboratory Data:   High Sensitivity Troponin:   Last Labs      Recent Labs  Lab 09/05/22 1219 09/05/22 1422  TROPONINIHS 23* 19*         Chemistry Last Labs     Recent Labs  Lab 09/05/22 1219  NA 136  K 4.4  CL 101  CO2 27  GLUCOSE 127*  BUN 17  CREATININE 0.85  CALCIUM 9.2  GFRNONAA >60  ANIONGAP 8      Last Labs  No results for input(s): "PROT", "ALBUMIN", "AST", "ALT", "ALKPHOS", "BILITOT" in the last 168 hours.   Lipids  Last Labs  No results for input(s): "CHOL", "TRIG", "HDL", "LABVLDL", "LDLCALC", "CHOLHDL" in the last 168 hours.   Hematology Last Labs     Recent Labs  Lab 09/05/22 1219  WBC 9.2  RBC 4.84  HGB 14.9  HCT 46.1  MCV 95.2  MCH 30.8  MCHC 32.3  RDW 15.8*  PLT 164      Thyroid  Last Labs     Recent Labs  Lab 09/05/22 1422  TSH 1.541      BNP Last Labs     Recent Labs  Lab 09/05/22 1219  BNP 394.0*      DDimer  Last Labs  No results for input(s): "DDIMER" in the last 168 hours.  Radiology/Studies:  DG Chest 2 View   Result Date: 09/05/2022 CLINICAL DATA:  Shortness of breath EXAM: CHEST - 2 VIEW COMPARISON:  X-ray 08/26/2022 FINDINGS: Enlarged cardiopericardial silhouette. Calcified aorta. Vascular congestion. No pneumothorax, effusion or edema. Right IJ chest port with tip along the central SVC above the right atrium. Film is under penetrated degenerative changes of the spine. Surgical clips in the right upper abdomen IMPRESSION: Right IJ chest port. Enlarged cardiopericardial silhouette with a vascular congestion. Electronically Signed   By: Karen Kays M.D.   On: 09/05/2022 12:46       Assessment and Plan:    1.Bradycardia - long history of conduction disease, trifasciular block back in 2016 - presents today in afib but with a junctional escape rhythm in the 30s with his chronic underlying RBBB/LAFB.  BP's are stable and actually elevated. Has had fatigue, weakness, dizzienss at home. - not on any av nodal agents at home - normal thyroid studies - likely will need pacemaker. Needs EP evaluation at [facility] - stable junctional and hypertensive, ok  for tele bed. Does not require chronotropic drip or temp pacing - hold eliquis in case needs pacemaker, last dose was AM 5/1   2. Afib - hold anticoag for possible pacemaker - there is not a strong indication for heparin bridging   3. Heart falure, unknown type - severe LE edema on exam. CXR vascular congestion, BNP elevated - update echo - lasix 40mg  x 1 in ER, reevaluate any additional doses tomorrow - adrenal insufficiency, on florinef and prednisone. Continue at current doses at this time, defer to medicine if any adjustments needed   4. Lymphoma - in remission, most recent PET scan showed no active cancer - followed by oncology   5. Adrenal insufficiency - on florinef, prednisone at home   Medically complex history with prior history of lymphoma, adrenal insufficiency. Have asked patient be admitted to medicine team and transferred to [facility]. EP to evaluate patient in the AM from cardiac standpoint       For questions or updates, please contact  [facility] Please consult [email or web address] for contact info under      Signed, Dina Rich, MD  09/05/2022 3:37 PM

## 2022-09-05 NOTE — H&P (Deleted)
Cardiology Admission History and Physical   Patient ID: TRE SANKER MRN: [id number]; DOB: 08/21/1950   Admission date: 09/05/2022  PCP:  Rebecka Apley, NP   [facility] Providers Cardiologist:  Tenny Craw     Chief Complaint:  Fatigue  Patient Profile:   Roy [name] is a 72 y.o. male with history of afib who is being seen 09/05/2022 for the evaluation of fatigue and bradycardia.  History of Present Illness:   Mr. Hazelip 72 yo male history of afib not followed by cards since initial eval in 2016, conduction disease with trifascicular block back in 2016, hypothyroidism, obesity,  Stage IV bcell lympoma s/p chemo with recent PET without active diease, adrenal insufficiency on florinef and prednisone,presents with 1 week of leg swelling with generalized fatigue and weakness and dizziness   K 4.4 Cr 0.85 BUN 17 BNP 394 WBC 9.2 Hgb 14.9 Plt 164 TSH 1.5 Trop 23-->19 CXR vasculatr congestion EKG afib, junctional rhythm 30s  2022 echo: LVEF 55-60%, indet diatolic, RV poorly visualized,   Past Medical History:  Diagnosis Date   Atrial fibrillation (HCC)    Cancer (HCC)    Diffuse Large B-Cell lymphoma   GERD (gastroesophageal reflux disease)    High cholesterol    Hypertension    Obesity     Past Surgical History:  Procedure Laterality Date   APPENDECTOMY     CATARACT EXTRACTION Bilateral 2022   CHOLECYSTECTOMY     ESOPHAGEAL DILATION     multiple times   IR IMAGING GUIDED PORT INSERTION  03/31/2021   JOINT REPLACEMENT Left    hip     Medications Prior to Admission: Prior to Admission medications   Medication Sig Start Date End Date Taking? Authorizing Provider  acetaminophen (TYLENOL) 325 MG tablet Take 2 tablets (650 mg total) by mouth every 6 (six) hours as needed for headache. 01/13/21  Yes Emokpae, Courage, MD  alfuzosin (UROXATRAL) 10 MG 24 hr tablet TAKE ONE TABLET DAILY WITH BREAKFAST 02/01/22  Yes Bjorn Pippin, MD  amLODipine (NORVASC) 5 MG  tablet Take 5 mg by mouth daily.   Yes [provider]  ELIQUIS 5 MG TABS tablet Take 5 mg by mouth 2 (two) times daily. 08/03/21  Yes [provider]  EQUATE STOOL SOFTENER 100 MG capsule Take 2 capsules by mouth twice daily 08/13/21  Yes Pennington, Rebekah M, PA-C  fludrocortisone (FLORINEF) 0.1 MG tablet TAKE ONE TABLET ONCE DAILY WITH BREAKFAST 06/15/22  Yes Nida, Denman George, MD  levocetirizine (XYZAL) 5 MG tablet Take 5 mg by mouth every evening. 08/30/22  Yes [provider]  levothyroxine (SYNTHROID) 100 MCG tablet TAKE ONE TABLET ONCE DAILY BEFORE BREAKFAST 06/07/22  Yes Nida, Denman George, MD  losartan (COZAAR) 25 MG tablet Take 25 mg by mouth daily. 08/30/22  Yes [provider]  magnesium oxide (MAG-OX) 400 (240 Mg) MG tablet Take 400 mg by mouth daily.   Yes [provider]  omeprazole (PRILOSEC) 10 MG capsule Take 20 mg by mouth daily. 10/26/21  Yes [provider]  potassium chloride (KLOR-CON) 10 MEQ tablet TAKE TWO TABLETS TWICE DAILY 06/19/22  Yes Nida, Denman George, MD  predniSONE (DELTASONE) 10 MG tablet TAKE ONE TABLET ONCE DAILY WITH BREAKFAST 05/29/22  Yes Nida, Denman George, MD  pregabalin (LYRICA) 150 MG capsule Take 150 mg by mouth 2 (two) times daily. 08/14/22  Yes [provider]  rosuvastatin (CRESTOR) 5 MG tablet Take 1 tablet by mouth at bedtime. 08/23/20  Yes [provider]  valACYclovir (VALTREX) 1000 MG tablet Take 1,000 mg by mouth daily.   Yes [provider]  vitamin B-12 (CYANOCOBALAMIN) 1000 MCG tablet Take 1 tablet (1,000 mcg total) by mouth daily. 02/28/21  Yes Pennington, Rebekah M, PA-C  cefpodoxime (VANTIN) 200 MG tablet Take 1 tablet (200 mg total) by mouth 2 (two) times daily. Patient not taking: Reported on 09/05/2022 07/06/22   Pauline Aus, PA-C  FEROSUL 325 (65 Fe) MG tablet Take 1 tablet by mouth once daily with breakfast Patient not taking: Reported on 09/05/2022 07/30/21    Carnella Guadalajara, PA-C     Allergies:    Allergies  Allergen Reactions   Adhesive [Tape]     Contact dermatitis   Sulfa Antibiotics     UNKNOWN Childhood reaction    Social History:   Social History   Socioeconomic History   Marital status: Widowed    Spouse name: Not on file   Number of children: 3   Years of education: Not on file   Highest education level: Not on file  Occupational History   Occupation: Retired  Tobacco Use   Smoking status: Never    Passive exposure: Never   Smokeless tobacco: Never  Vaping Use   Vaping Use: Never used  Substance and Sexual Activity   Alcohol use: No   Drug use: No   Sexual activity: Not Currently  Other Topics Concern   Not on file  Social History Narrative   Not on file   Social Determinants of Health   Financial Resource Strain: Low Risk  (11/08/2020)   Overall Financial Resource Strain (CARDIA)    Difficulty of Paying Living Expenses: Not hard at all  Food Insecurity: No Food Insecurity (11/08/2020)   Hunger Vital Sign    Worried About Running Out of Food in the Last Year: Never true    Ran Out of Food in the Last Year: Never true  Transportation Needs: No Transportation Needs (11/08/2020)   PRAPARE - Administrator, Civil Service (Medical): No    Lack of Transportation (Non-Medical): No  Physical Activity: Sufficiently Active (11/08/2020)   Exercise Vital Sign    Days of Exercise per Week: 5 days    Minutes of Exercise per Session: 30 min  Stress: No Stress Concern Present (11/08/2020)   [employer] of Occupational Health - Occupational Stress Questionnaire    Feeling of Stress : Not at all  Social Connections: Moderately Isolated (11/08/2020)   Social Connection and Isolation Panel [NHANES]    Frequency of Communication with Friends and Family: More than three times a week    Frequency of Social Gatherings with Friends and Family: Three times a week    Attends Religious Services: 1 to 4 times per  year    Active Member of Clubs or Organizations: No    Attends Banker Meetings: Never    Marital Status: Widowed  Intimate Partner Violence: Not At Risk (11/08/2020)   Humiliation, Afraid, Rape, and Kick questionnaire    Fear of Current or Ex-Partner: No    Emotionally Abused: No    Physically Abused: No    Sexually Abused: No    Family History:   The patient's family history includes Heart attack in his father; Heart failure in his father; Thyroid disease in his sister.    ROS:  Please see the history of present illness.  All other ROS reviewed and negative.     Physical Exam/Data:  Vitals:   09/05/22 1306 09/05/22 1330 09/05/22 1400 09/05/22 1402  BP:  (!) 121/58 120/67   Pulse:  (!) 34 (!) 35 (!) 32  Resp:  14 (!) 24 14  Temp:      SpO2: 91% 90%  92%   No intake or output data in the 24 hours ending 09/05/22 1537    08/26/2022    7:49 AM 07/12/2022    2:51 PM 07/06/2022   12:00 PM  Last 3 Weights  Weight (lbs) 270 lb 282 lb 3.2 oz 282 lb 11.2 oz  Weight (kg) 122.471 kg 128.005 kg 128.232 kg     There is no height or weight on file to calculate BMI.  General:  Well nourished, well developed, in no acute distress HEENT: normal Neck: no JVD Vascular: No carotid bruits; Distal pulses 2+ bilaterally   Cardiac:  regular, brady Lungs:  clear to auscultation bilaterally, no wheezing, rhonchi or rales  Abd: soft, nontender, no hepatomegaly  Ext: 2+bilateral LE edema Musculoskeletal:  No deformities, BUE and BLE strength normal and equal Skin: warm and dry  Neuro:  CNs 2-12 intact, no focal abnormalities noted Psych:  Normal affect     Laboratory Data:  High Sensitivity Troponin:   Recent Labs  Lab 09/05/22 1219 09/05/22 1422  TROPONINIHS 23* 19*      Chemistry Recent Labs  Lab 09/05/22 1219  NA 136  K 4.4  CL 101  CO2 27  GLUCOSE 127*  BUN 17  CREATININE 0.85  CALCIUM 9.2  GFRNONAA >60  ANIONGAP 8    No results for input(s): "PROT",  "ALBUMIN", "AST", "ALT", "ALKPHOS", "BILITOT" in the last 168 hours. Lipids No results for input(s): "CHOL", "TRIG", "HDL", "LABVLDL", "LDLCALC", "CHOLHDL" in the last 168 hours. Hematology Recent Labs  Lab 09/05/22 1219  WBC 9.2  RBC 4.84  HGB 14.9  HCT 46.1  MCV 95.2  MCH 30.8  MCHC 32.3  RDW 15.8*  PLT 164   Thyroid  Recent Labs  Lab 09/05/22 1422  TSH 1.541   BNP Recent Labs  Lab 09/05/22 1219  BNP 394.0*    DDimer No results for input(s): "DDIMER" in the last 168 hours.   Radiology/Studies:  DG Chest 2 View  Result Date: 09/05/2022 CLINICAL DATA:  Shortness of breath EXAM: CHEST - 2 VIEW COMPARISON:  X-ray 08/26/2022 FINDINGS: Enlarged cardiopericardial silhouette. Calcified aorta. Vascular congestion. No pneumothorax, effusion or edema. Right IJ chest port with tip along the central SVC above the right atrium. Film is under penetrated degenerative changes of the spine. Surgical clips in the right upper abdomen IMPRESSION: Right IJ chest port. Enlarged cardiopericardial silhouette with a vascular congestion. Electronically Signed   By: Karen Kays M.D.   On: 09/05/2022 12:46     Assessment and Plan:   1.Bradycardia - long history of conduction disease, trifasciular block back in 2016 - presents today in afib but with a junctional escape rhythm in the 30s with his chronic underlying RBBB/LAFB.  BP's are stable and actually elevated. Has had fatigue, weakness, dizzienss at home. - not on any av nodal agents at home - normal thyroid studies - likely will need pacemaker. Needs EP evaluation at [facility] - stable junctional and hypertensive, ok for tele bed. Does not require chronotropic drip or temp pacing - hold eliquis in case needs pacemaker, last dose was AM 5/1  2. Afib - hold anticoag for possible pacemaker - there is not a strong indication for heparin bridging  3. Heart  falure, unknown type - severe LE edema on exam. CXR vascular congestion, BNP elevated -  update echo - lasix 40mg  x 1 in ER, reevaluate any additional doses tomorrow - adrenal insufficiency, on florinef and prednisone. Continue at current doses at this time, defer to medicine if any adjustments needed  4. Lymphoma - in remission, most recent PET scan showed no active cancer - followed by oncology  5. Adrenal insufficiency - on florinef, prednisone at home  Medically complex history with prior history of lymphoma, adrenal insufficiency. Have asked patient be admitted to medicine team and transferred to [facility]. EP to evaluate patient in the AM from cardiac standpoint    For questions or updates, please contact [facility] Please consult [email or web address] for contact info under     Signed, Dina Rich, MD  09/05/2022 3:37 PM

## 2022-09-05 NOTE — Assessment & Plan Note (Deleted)
Heart rate in the 30s.  Blood pressure stable 120s to 140s.  History of conduction disease with trifascicular block back in 2016.  Not on AV nodal agents.  Now in heart failure. -Evaluated by cardiology, admit to [facility], EP evaluation, likely needs pacemaker, does not require chronotropic drip or temporary pacing. -Hold Eliquis, last dose this morning, 5/1

## 2022-09-06 ENCOUNTER — Ambulatory Visit: Payer: 59 | Admitting: Urology

## 2022-09-06 ENCOUNTER — Inpatient Hospital Stay (HOSPITAL_COMMUNITY): Payer: 59

## 2022-09-06 DIAGNOSIS — I442 Atrioventricular block, complete: Secondary | ICD-10-CM

## 2022-09-06 DIAGNOSIS — R0609 Other forms of dyspnea: Secondary | ICD-10-CM

## 2022-09-06 DIAGNOSIS — I4819 Other persistent atrial fibrillation: Secondary | ICD-10-CM

## 2022-09-06 DIAGNOSIS — R001 Bradycardia, unspecified: Secondary | ICD-10-CM | POA: Diagnosis not present

## 2022-09-06 LAB — BASIC METABOLIC PANEL
Anion gap: 7 (ref 5–15)
BUN: 18 mg/dL (ref 8–23)
CO2: 30 mmol/L (ref 22–32)
Calcium: 9.3 mg/dL (ref 8.9–10.3)
Chloride: 99 mmol/L (ref 98–111)
Creatinine, Ser: 1.22 mg/dL (ref 0.61–1.24)
GFR, Estimated: >60 mL/min (ref >60)
Glucose, Bld: 119 mg/dL — ABNORMAL HIGH (ref 70–99)
Potassium: 4.2 mmol/L (ref 3.5–5.1)
Sodium: 136 mmol/L (ref 135–145)

## 2022-09-06 LAB — CBC
HCT: 44.9 % (ref 39.0–52.0)
Hemoglobin: 14.4 g/dL (ref 13.0–17.0)
MCH: 30.4 pg (ref 26.0–34.0)
MCHC: 32.1 g/dL (ref 30.0–36.0)
MCV: 94.9 fL (ref 80.0–100.0)
Platelets: 156 10*3/uL (ref 150–400)
RBC: 4.73 MIL/uL (ref 4.22–5.81)
RDW: 15.5 % (ref 11.5–15.5)
WBC: 10.4 10*3/uL (ref 4.0–10.5)
nRBC: 0.5 % — ABNORMAL HIGH (ref 0.0–0.2)

## 2022-09-06 LAB — ECHOCARDIOGRAM COMPLETE
AR max vel: 1.84 cm2
AV Area VTI: 2.15 cm2
AV Area mean vel: 1.62 cm2
AV Mean grad: 26 mmHg
AV Peak grad: 39.7 mmHg
AV Vena cont: 0.3 cm
Ao pk vel: 3.15 m/s
Height: 72 in
S' Lateral: 4 cm
Weight: 4659.2 oz

## 2022-09-06 MED ORDER — PERFLUTREN LIPID MICROSPHERE
1.0000 mL | INTRAVENOUS | Status: AC | PRN
  Administered 2022-09-06: 3 mL via INTRAVENOUS

## 2022-09-06 NOTE — Progress Notes (Signed)
PROGRESS NOTE Roy [name]  ZOX:[id number] DOB: [birth year] DOA: 09/05/2022 PCP: Rebecka Apley, NP  Brief Narrative/Hospital Course: 72 y.o.m w/ Hypertension, B-cell lymphoma, PAF on Eliquis presented w/ bilateral lower extremity swelling up to his thighs over the past week, generalized weakness/fatigue,abdominal bloating, constipation but no chest pain/difficulty breathing and recent cough and congestion symptoms a week ago for which she was prescribed a course of steroid  In the ED: Severe bradycardia 32-36, rest of vitals stable, BNP up at 394. CXR-enlarged cardiopericardial silhouette, with vascular congestion. EKG shows junctional rhythm versus atrial fibrillation rate of 34. TSH 1.5.  Troponin 23> 19. Cardiology was consulted and given a dose of Lasix and transferred to [facility] for EP evaluation.      Subjective: Seen examined C/o cough and cold Has leg edema Hr in 30- but no dizziness or chest pain   Assessment and Plan: Principal Problem:   Symptomatic bradycardia Active Problems:   Atrial fibrillation (HCC)   Trifascicular block   Chronic anticoagulation   Acute congestive heart failure (HCC)   Large B-cell lymphoma (HCC)   Essential hypertension   Adrenal insufficiency (HCC)   BPH (benign prostatic hyperplasia)  Symptomatic bradycardia Long history of conduction disease/trifascicular block in 2016 Now w/ A-fib with junctional escape rhythm in 30s with chronic RBBB/LAFB: TSH stable.  Not on AV nodal blocking agent.  Seen by cardiology transferred to [facility] for EP evaluation and currently stable.  Eliquis on hold for possible pacemaker last dose 5/1 AM  Mildly elevated troponin but flat: Suspect in the setting of bradycardia/CHF  PAF: Heart rate as low as above anticoagulation on hold and no need to bridge per cardiology  Congestive heart failure unknown EF: Current Lasix dose 5/1, currently is to reassess for further dosing.  Echo pending.  Essential  hypertension-stable. On amlodipine losartan- HOLD CCB.  Large B-cell lymphoma:Follows with Dr. Ellin Saba.  Status post chemotherapy.  Pet scan 09/2021- no evidence of metabolically active lymphoma.  BPH  cont alfuzosin  Adrenal insufficiency on chronic prednisone and Florinef, continue the same  Class II Obesity:Patient's Body mass index is 39.49 kg/m. : Will benefit with PCP follow-up, weight loss  healthy lifestyle and outpatient sleep evaluation.   DVT prophylaxis: SCDs Start: 09/05/22 1831 Code Status:   Code Status: Full Code Family Communication: plan of care discussed with patient at bedside. Patient status is:  inpatient because of bradycardia Level of care: Progressive   Dispo: The patient is from: Home            Anticipated disposition: TBD  Objective: Vitals last 24 hrs: Vitals:   09/05/22 2000 09/06/22 0011 09/06/22 0404 09/06/22 0727  BP:  120/67 (!) 109/57 (!) 144/66  Pulse:  68 (!) 31 (!) 31  Resp: 17 18 16 13   Temp:  97.6 F (36.4 C) 97.6 F (36.4 C) 97.7 F (36.5 C)  TempSrc:  Oral Oral Oral  SpO2:  96% 96% 100%  Weight:   132.1 kg   Height:   6' (1.829 m)    Weight change:   Physical Examination:  General exam: alert awake, older than stated age HEENT:Oral mucosa moist, Ear/Nose WNL grossly Respiratory system: bilaterally CLEAR BS, no use of accessory muscle Cardiovascular system: S1 & S2 +, No JVD. Gastrointestinal system: Abdomen soft,NT,ND, BS+ Nervous System:Alert, awake, moving extremities. Extremities: LE edema +,distal peripheral pulses palpable.  Skin: No rashes,no icterus. MSK: Normal muscle bulk,tone, power  Medications reviewed:  Scheduled Meds:  alfuzosin  10  mg Oral Q breakfast   amLODipine  5 mg Oral Daily   fludrocortisone  0.1 mg Oral Daily   levothyroxine  100 mcg Oral Q0600   losartan  25 mg Oral Daily   polyethylene glycol  17 g Oral BID   predniSONE  10 mg Oral Q breakfast   pregabalin  150 mg Oral BID   rosuvastatin   5 mg Oral QHS   valACYclovir  1,000 mg Oral Daily  Continuous Infusions:  Diet Order             Diet NPO time specified Except for: Sips with Meds  Diet effective midnight                  Intake/Output Summary (Last 24 hours) at 09/06/2022 1004 Last data filed at 09/06/2022 0947 Gross per 24 hour  Intake 480 ml  Output 3225 ml  Net -2745 ml   Net IO Since Admission: -2,745 mL [09/06/22 1004]  Wt Readings from Last 3 Encounters:  09/06/22 132.1 kg  08/26/22 122.5 kg  07/12/22 128 kg    Unresulted Labs (From admission, onward)     Start     Ordered   09/06/22 0500  Basic metabolic panel  Daily,   R      09/05/22 1830          Data Reviewed: I have personally reviewed following labs and imaging studies CBC: Recent Labs  Lab 09/05/22 1219 09/06/22 0150  WBC 9.2 10.4  NEUTROABS 7.3  --   HGB 14.9 14.4  HCT 46.1 44.9  MCV 95.2 94.9  PLT 164 156   Basic Metabolic Panel: Recent Labs  Lab 09/05/22 1219 09/06/22 0150  NA 136 136  K 4.4 4.2  CL 101 99  CO2 27 30  GLUCOSE 127* 119*  BUN 17 18  CREATININE 0.85 1.22  CALCIUM 9.2 9.3  CBG: Recent Labs  Lab 09/05/22 1245  GLUCAP 130*   Recent Labs    09/05/22 1422  TSH 1.541   Sepsis Labs: No results for input(s): "PROCALCITON", "LATICACIDVEN" in the last 168 hours.  Recent Results (from the past 240 hour(s))  SARS Coronavirus 2 by RT PCR (hospital order, performed in [facility] hospital lab) *cepheid single result test* Anterior Nasal Swab     Status: None   Collection Time: 09/05/22 12:32 PM   Specimen: Anterior Nasal Swab  Result Value Ref Range Status   SARS Coronavirus 2 by RT PCR NEGATIVE NEGATIVE Final    Comment: (NOTE) SARS-CoV-2 target nucleic acids are NOT DETECTED.  The SARS-CoV-2 RNA is generally detectable in upper and lower respiratory specimens during the acute phase of infection. The lowest concentration of SARS-CoV-2 viral copies this assay can detect is 250 copies / mL. A  negative result does not preclude SARS-CoV-2 infection and should not be used as the sole basis for treatment or other patient management decisions.  A negative result may occur with improper specimen collection / handling, submission of specimen other than nasopharyngeal swab, presence of viral mutation(s) within the areas targeted by this assay, and inadequate number of viral copies (<250 copies / mL). A negative result must be combined with clinical observations, patient history, and epidemiological information.  Fact Sheet for Patients:   RoadLapTop.co.za  Fact Sheet for Healthcare Providers: http://kim-miller.com/  This test is not yet approved or  cleared by the [place] FDA and has been authorized for detection and/or diagnosis of SARS-CoV-2 by FDA under an Emergency Use  Authorization (EUA).  This EUA will remain in effect (meaning this test can be used) for the duration of the COVID-19 declaration under Section 564(b)(1) of the Act, 21 U.S.C. section 360bbb-3(b)(1), unless the authorization is terminated or revoked sooner.  Performed at [facility], [address] [place], [place] [address]     Antimicrobials: Anti-infectives (From admission, onward)    Start     Dose/Rate Route Frequency Ordered Stop   09/06/22 1000  valACYclovir (VALTREX) tablet 1,000 mg        1,000 mg Oral Daily 09/05/22 1830        Culture/Microbiology    Component Value Date/Time   SDES  07/13/2021 2119    URINE, CLEAN CATCH Performed at [facility], [address], [place], [place] [address]    [facility]  07/13/2021 2119    NONE Performed at [facility], [address], [place], [place] [address]    CULT MULTIPLE SPECIES PRESENT, SUGGEST RECOLLECTION (A) 07/13/2021 2119   REPTSTATUS 07/15/2021 FINAL 07/13/2021 2119   Radiology Studies: DG Chest 2 View  Result Date: 09/05/2022 CLINICAL DATA:  Shortness of breath EXAM: CHEST - 2 VIEW  COMPARISON:  X-ray 08/26/2022 FINDINGS: Enlarged cardiopericardial silhouette. Calcified aorta. Vascular congestion. No pneumothorax, effusion or edema. Right IJ chest port with tip along the central SVC above the right atrium. Film is under penetrated degenerative changes of the spine. Surgical clips in the right upper abdomen IMPRESSION: Right IJ chest port. Enlarged cardiopericardial silhouette with a vascular congestion. Electronically Signed   By: Karen Kays M.D.   On: 09/05/2022 12:46     LOS: 1 day   Lanae Boast, MD [facility]  09/06/2022, 10:04 AM

## 2022-09-06 NOTE — Progress Notes (Signed)
  Echocardiogram 2D Echocardiogram has been performed.  Roy [name] 09/06/2022, 9:43 AM

## 2022-09-06 NOTE — Hospital Course (Addendum)
71 y.o.m w/ Hypertension, B-cell lymphoma, PAF on Eliquis presented w/ bilateral lower extremity swelling up to his thighs over the past week, generalized weakness/fatigue,abdominal bloating, constipation but no chest pain/difficulty breathing and recent cough and congestion symptoms a week ago for which she was prescribed a course of steroid In the ED: Severe bradycardia 32-36, rest of vitals stable, BNP up at 394. CXR-enlarged cardiopericardial silhouette, with vascular congestion. EKG shows junctional rhythm versus atrial fibrillation rate of 34. TSH 1.5.  Troponin 23> 19. Cardiology was consulted and given a dose of Lasix and transferred to [facility] from [facility] for EP evaluation. Seen by cardiology felt to be complete heart block and symptomatic underlying pacemaker placement 09/07/2022 postprocedure doing well leg edema has improved, eager to go home.  Chest x-ray well-positioned left anterior chest wall dual-lead pacemaker.

## 2022-09-06 NOTE — Consult Note (Signed)
Cardiology Consultation   Patient ID: Roy [name] MRN: [id number]; DOB: 06/09/1950  Admit date: 09/05/2022 Date of Consult: 09/06/2022  PCP:  Rebecka Apley, NP   [facility] Providers Cardiologist:  None    Patient Profile:   Roy [name] is a 72 y.o. male with a hx of AFib, hypothyroidism, obesity, lymphoma, adrenal insufficiency (on florinef and prednisone) who is being seen 09/06/2022 for the evaluation of bradycardia at the request of Dr. Jonathon Bellows.  He has a R sided port 2/2 Stage IVb diffuse large B-cell lymphoma involving bone marrow, spleen   History of Present Illness:   Roy [name] last saw hematology 06/28/22 s/p 6 cycles of R-CHOP from 04/13/2021 through 08/14/2021 PET scan (09/22/2021): No evidence of metabolically active lymphoma. Posttreatment PET scan showed bilateral hypermetabolic parotid nodules, clinically consistent with Warthin's tumor's. - He has seen Dr. Alinda Sierras before but never followed up for biopsy.  No active cardiology care  Admitted yesterday with a week of unusual LE edema, progressive fatigue, weakness, and some dizziness. He was found in AFib w/SVR felt to represent junctional escape rhythm. Labs unremarkable/not contributing, on no nodal blocking agents Stable BP, planned for EP consult the following day.  LABS K+ 4.4 > 4.2 BUN/Creat 17/0.85 > 18/1.22 BNP 188, 394 WBC 9.2 > 10.4 H/H 14/44 Plts 156 TSH 1.541  Home meds reviewed, no nodal blocking agents   He has been unusually weak/tired for 2-3 weeks, noted his HR 40 or so a few days ago. 4/24 via his PMD had some med changes made that he though was making him more tired  Amlodipine > losartan Lyrica added Zpack (he reports completed) for a cough Citirizine  He has not had near syncope or syncope. Feels sluggish but "fine" No CP, unusual SOB   Past Medical History:  Diagnosis Date   Atrial fibrillation (HCC)    Cancer (HCC)    Diffuse Large B-Cell lymphoma    GERD (gastroesophageal reflux disease)    High cholesterol    Hypertension    Obesity     Past Surgical History:  Procedure Laterality Date   APPENDECTOMY     CATARACT EXTRACTION Bilateral 2022   CHOLECYSTECTOMY     ESOPHAGEAL DILATION     multiple times   IR IMAGING GUIDED PORT INSERTION  03/31/2021   JOINT REPLACEMENT Left    hip     Home Medications:  Prior to Admission medications   Medication Sig Start Date End Date Taking? Authorizing Provider  acetaminophen (TYLENOL) 325 MG tablet Take 2 tablets (650 mg total) by mouth every 6 (six) hours as needed for headache. 01/13/21  Yes Emokpae, Courage, MD  alfuzosin (UROXATRAL) 10 MG 24 hr tablet TAKE ONE TABLET DAILY WITH BREAKFAST 02/01/22  Yes Bjorn Pippin, MD  amLODipine (NORVASC) 5 MG tablet Take 5 mg by mouth daily.   Yes [provider]  ELIQUIS 5 MG TABS tablet Take 5 mg by mouth 2 (two) times daily. 08/03/21  Yes [provider]  EQUATE STOOL SOFTENER 100 MG capsule Take 2 capsules by mouth twice daily 08/13/21  Yes Pennington, Rebekah M, PA-C  fludrocortisone (FLORINEF) 0.1 MG tablet TAKE ONE TABLET ONCE DAILY WITH BREAKFAST 06/15/22  Yes Nida, Denman George, MD  levocetirizine (XYZAL) 5 MG tablet Take 5 mg by mouth every evening. 08/30/22  Yes [provider]  levothyroxine (SYNTHROID) 100 MCG tablet TAKE ONE TABLET ONCE DAILY BEFORE BREAKFAST 06/07/22  Yes Nida, Denman George, MD  losartan (COZAAR)  25 MG tablet Take 25 mg by mouth daily. 08/30/22  Yes [provider]  magnesium oxide (MAG-OX) 400 (240 Mg) MG tablet Take 400 mg by mouth daily.   Yes [provider]  omeprazole (PRILOSEC) 10 MG capsule Take 20 mg by mouth daily. 10/26/21  Yes [provider]  potassium chloride (KLOR-CON) 10 MEQ tablet TAKE TWO TABLETS TWICE DAILY 06/19/22  Yes Nida, Denman George, MD  predniSONE (DELTASONE) 10 MG tablet TAKE ONE TABLET ONCE DAILY WITH BREAKFAST 05/29/22  Yes Nida, Denman George,  MD  pregabalin (LYRICA) 150 MG capsule Take 150 mg by mouth 2 (two) times daily. 08/14/22  Yes [provider]  rosuvastatin (CRESTOR) 5 MG tablet Take 1 tablet by mouth at bedtime. 08/23/20  Yes [provider]  valACYclovir (VALTREX) 1000 MG tablet Take 1,000 mg by mouth daily.   Yes [provider]  vitamin B-12 (CYANOCOBALAMIN) 1000 MCG tablet Take 1 tablet (1,000 mcg total) by mouth daily. 02/28/21  Yes Pennington, Rebekah M, PA-C  cefpodoxime (VANTIN) 200 MG tablet Take 1 tablet (200 mg total) by mouth 2 (two) times daily. Patient not taking: Reported on 09/05/2022 07/06/22   Pauline Aus, PA-C  FEROSUL 325 (65 Fe) MG tablet Take 1 tablet by mouth once daily with breakfast Patient not taking: Reported on 09/05/2022 07/30/21   Carnella Guadalajara, PA-C    Inpatient Medications: Scheduled Meds:  alfuzosin  10 mg Oral Q breakfast   fludrocortisone  0.1 mg Oral Daily   levothyroxine  100 mcg Oral Q0600   losartan  25 mg Oral Daily   polyethylene glycol  17 g Oral BID   predniSONE  10 mg Oral Q breakfast   pregabalin  150 mg Oral BID   rosuvastatin  5 mg Oral QHS   valACYclovir  1,000 mg Oral Daily   Continuous Infusions:  PRN Meds: acetaminophen **OR** acetaminophen, ondansetron **OR** ondansetron (ZOFRAN) IV  Allergies:    Allergies  Allergen Reactions   Adhesive [Tape]     Contact dermatitis   Sulfa Antibiotics     UNKNOWN Childhood reaction    Social History:   Social History   Socioeconomic History   Marital status: Widowed    Spouse name: Not on file   Number of children: 3   Years of education: Not on file   Highest education level: Not on file  Occupational History   Occupation: Retired  Tobacco Use   Smoking status: Never    Passive exposure: Never   Smokeless tobacco: Never  Vaping Use   Vaping Use: Never used  Substance and Sexual Activity   Alcohol use: No   Drug use: No   Sexual activity: Not Currently  Other Topics Concern    Not on file  Social History Narrative   Not on file   Social Determinants of Health   Financial Resource Strain: Low Risk  (11/08/2020)   Overall Financial Resource Strain (CARDIA)    Difficulty of Paying Living Expenses: Not hard at all  Food Insecurity: No Food Insecurity (11/08/2020)   Hunger Vital Sign    Worried About Running Out of Food in the Last Year: Never true    Ran Out of Food in the Last Year: Never true  Transportation Needs: No Transportation Needs (11/08/2020)   PRAPARE - Administrator, Civil Service (Medical): No    Lack of Transportation (Non-Medical): No  Physical Activity: Sufficiently Active (11/08/2020)   Exercise Vital Sign    Days  of Exercise per Week: 5 days    Minutes of Exercise per Session: 30 min  Stress: No Stress Concern Present (11/08/2020)   [employer] of Occupational Health - Occupational Stress Questionnaire    Feeling of Stress : Not at all  Social Connections: Moderately Isolated (11/08/2020)   Social Connection and Isolation Panel [NHANES]    Frequency of Communication with Friends and Family: More than three times a week    Frequency of Social Gatherings with Friends and Family: Three times a week    Attends Religious Services: 1 to 4 times per year    Active Member of Clubs or Organizations: No    Attends Banker Meetings: Never    Marital Status: Widowed  Intimate Partner Violence: Not At Risk (11/08/2020)   Humiliation, Afraid, Rape, and Kick questionnaire    Fear of Current or Ex-Partner: No    Emotionally Abused: No    Physically Abused: No    Sexually Abused: No    Family History:    Family History  Problem Relation Age of Onset   Heart failure Father    Heart attack Father        Deceased    Thyroid disease Sister      ROS:  Please see the history of present illness.  All other ROS reviewed and negative.     Physical Exam/Data:   Vitals:   09/06/22 0011 09/06/22 0404 09/06/22 0727 09/06/22 1120   BP: 120/67 (!) 109/57 (!) 144/66 (!) 148/58  Pulse: 68 (!) 31 (!) 31 (!) 34  Resp: 18 16 13 19   Temp: 97.6 F (36.4 C) 97.6 F (36.4 C) 97.7 F (36.5 C) 98.1 F (36.7 C)  TempSrc: Oral Oral Oral Oral  SpO2: 96% 96% 100% 98%  Weight:  132.1 kg    Height:  6' (1.829 m)      Intake/Output Summary (Last 24 hours) at 09/06/2022 1141 Last data filed at 09/06/2022 1123 Gross per 24 hour  Intake 480 ml  Output 3625 ml  Net -3145 ml      09/06/2022    4:04 AM 08/26/2022    7:49 AM 07/12/2022    2:51 PM  Last 3 Weights  Weight (lbs) 291 lb 3.2 oz 270 lb 282 lb 3.2 oz  Weight (kg) 132.087 kg 122.471 kg 128.005 kg     Body mass index is 39.49 kg/m.  General:  Well nourished, well developed, in no acute distress HEENT: normal Neck: no JVD Vascular: No carotid bruits; Distal pulses 2+ bilaterally Cardiac:  RRR; bradycardic no murmurs, gallops or rubs Lungs: CTA b/l no wheezing, rhonchi or rales  Abd: soft, nontender, no hepatomegaly  Ext: no edema Musculoskeletal:  No deformities, BUE and BLE strength normal and equal Skin: warm and dry  Neuro:  CNs 2-12 intact, no focal abnormalities noted Psych:  Normal affect   EKG:  The EKG was personally reviewed and demonstrates:    #1 AFib 35bpm, RBBB, LAD (only slightly irregular) #2 AFib 34bpm, RBBB, LAD (again just slightly irregular), likely represents junctional rhythm  Old 08/26/22: AFib 51bpm, RBBB, LAD 09/26/21: SR 77bpm, marked 1st degree AVblock  Telemetry:  Telemetry was personally reviewed and demonstrates:   AFib 30's, is quite regular, 30 or so bpm  Relevant CV Studies:  09/06/22: TTE 1. Left ventricular ejection fraction, by estimation, is 60 to 65%. The  left ventricle has normal function. The left ventricle has no regional  wall motion abnormalities. There is mild  left ventricular hypertrophy.  Left ventricular diastolic function  could not be evaluated.   2. Right ventricular systolic function is normal. The  right ventricular  size is mildly enlarged.   3. Left atrial size was mildly dilated.   4. The mitral valve is abnormal. Trivial mitral valve regurgitation.  Moderate mitral annular calcification.   5. The aortic valve is tricuspid. There is moderate calcification of the  aortic valve. Aortic valve regurgitation is trivial. Mild aortic valve  stenosis. Aortic valve mean gradient measures 26.0 mmHg. Aortic valve Vmax  measures 3.15 m/s.   6. Aortic dilatation noted. There is mild dilatation of the ascending  aorta, measuring 41 mm.   7. Rhythm strip during this exam demonstrates afib with slow ventricular  response - HR 30.   Comparison(s): No significant change from prior study. 01/09/2021: LVEF  55-60%, dilated aorta to 40 mm.   Laboratory Data:  High Sensitivity Troponin:   Recent Labs  Lab 09/05/22 1219 09/05/22 1422  TROPONINIHS 23* 19*     Chemistry Recent Labs  Lab 09/05/22 1219 09/06/22 0150  NA 136 136  K 4.4 4.2  CL 101 99  CO2 27 30  GLUCOSE 127* 119*  BUN 17 18  CREATININE 0.85 1.22  CALCIUM 9.2 9.3  GFRNONAA >60 >60  ANIONGAP 8 7    No results for input(s): "PROT", "ALBUMIN", "AST", "ALT", "ALKPHOS", "BILITOT" in the last 168 hours. Lipids No results for input(s): "CHOL", "TRIG", "HDL", "LABVLDL", "LDLCALC", "CHOLHDL" in the last 168 hours.  Hematology Recent Labs  Lab 09/05/22 1219 09/06/22 0150  WBC 9.2 10.4  RBC 4.84 4.73  HGB 14.9 14.4  HCT 46.1 44.9  MCV 95.2 94.9  MCH 30.8 30.4  MCHC 32.3 32.1  RDW 15.8* 15.5  PLT 164 156   Thyroid  Recent Labs  Lab 09/05/22 1422  TSH 1.541    BNP Recent Labs  Lab 09/05/22 1219  BNP 394.0*    DDimer No results for input(s): "DDIMER" in the last 168 hours.   Radiology/Studies:   DG Chest 2 View Result Date: 09/05/2022 CLINICAL DATA:  Shortness of breath EXAM: CHEST - 2 VIEW COMPARISON:  X-ray 08/26/2022 FINDINGS: Enlarged cardiopericardial silhouette. Calcified aorta. Vascular congestion. No  pneumothorax, effusion or edema. Right IJ chest port with tip along the central SVC above the right atrium. Film is under penetrated degenerative changes of the spine. Surgical clips in the right upper abdomen IMPRESSION: Right IJ chest port. Enlarged cardiopericardial silhouette with a vascular congestion. Electronically Signed   By: Karen Kays M.D.   On: 09/05/2022 12:46     Assessment and Plan:   Symptomatic bradycardia CHB with baseline QRS morphology AFib, unclear, appears persistent CHA2DS2Vasc is 2, on Eliquis held on admission for likely PPM Stable BP  No reversible causes noted He will need PPM, discussed rational for PPM and procedure (he has watched the education video) and agreeable to proceed We discussed his increased infection risk with chronic steroid and a port in place   He tells me they have not used his port in over a year, no blood draws, nothing. He has asked his oncologist apparently about getting it out though no definitive plan/answer Will d/w Dr Nelly Laurence, he will see him later today as well  Given his hemodynamic stability and current EP schedule, will not be today I will let him eat NPO after MN  4.  Adrenal insufficiency Home florinef and prednisone continued   Risk Assessment/Risk Scores:   For  questions or updates, please contact [facility] Please consult [email or web address] for contact info under    Signed, Sheilah Pigeon, PA-C  09/06/2022 11:41 AM

## 2022-09-07 ENCOUNTER — Inpatient Hospital Stay (HOSPITAL_COMMUNITY)
Admission: EM | Disposition: A | Discharge status: Home / Self Care | Payer: Self-pay | Source: Home / Self Care | Attending: Internal Medicine

## 2022-09-07 ENCOUNTER — Other Ambulatory Visit: Payer: Self-pay

## 2022-09-07 DIAGNOSIS — R001 Bradycardia, unspecified: Secondary | ICD-10-CM

## 2022-09-07 HISTORY — PX: PACEMAKER IMPLANT: EP1218

## 2022-09-07 LAB — BASIC METABOLIC PANEL
Anion gap: 10 (ref 5–15)
BUN: 24 mg/dL — ABNORMAL HIGH (ref 8–23)
CO2: 31 mmol/L (ref 22–32)
Calcium: 9.5 mg/dL (ref 8.9–10.3)
Chloride: 96 mmol/L — ABNORMAL LOW (ref 98–111)
Creatinine, Ser: 1.22 mg/dL (ref 0.61–1.24)
GFR, Estimated: >60 mL/min (ref >60)
Glucose, Bld: 126 mg/dL — ABNORMAL HIGH (ref 70–99)
Potassium: 4.6 mmol/L (ref 3.5–5.1)
Sodium: 137 mmol/L (ref 135–145)

## 2022-09-07 LAB — SURGICAL PCR SCREEN
MRSA, PCR: NEGATIVE
Staphylococcus aureus: NEGATIVE

## 2022-09-07 SURGERY — PACEMAKER IMPLANT

## 2022-09-07 MED ORDER — SODIUM CHLORIDE 0.9 % IV SOLN: 250.0000 mL | INTRAVENOUS | Status: DC

## 2022-09-07 MED ORDER — LIDOCAINE HCL (PF) 1 % IJ SOLN
INTRAMUSCULAR | Status: AC
  Filled 2022-09-07: qty 60

## 2022-09-07 MED ORDER — SODIUM CHLORIDE 0.9% FLUSH
3.0000 mL | Freq: Two times a day (BID) | INTRAVENOUS | Status: DC
  Administered 2022-09-07 (×2): 3 mL via INTRAVENOUS

## 2022-09-07 MED ORDER — SODIUM CHLORIDE 0.9 % IV SOLN
80.0000 mg | INTRAVENOUS | Status: AC
  Administered 2022-09-07: 80 mg

## 2022-09-07 MED ORDER — CHLORHEXIDINE GLUCONATE 4 % EX SOLN
60.0000 mL | Freq: Once | CUTANEOUS | Status: DC
  Filled 2022-09-07: qty 15

## 2022-09-07 MED ORDER — SODIUM CHLORIDE 0.9 % IV SOLN: INTRAVENOUS | Status: DC

## 2022-09-07 MED ORDER — HEPARIN (PORCINE) IN NACL 1000-0.9 UT/500ML-% IV SOLN
INTRAVENOUS | Status: DC | PRN
  Administered 2022-09-07: 500 mL

## 2022-09-07 MED ORDER — SODIUM CHLORIDE 0.9 % IV SOLN
INTRAVENOUS | Status: AC
  Filled 2022-09-07: qty 2

## 2022-09-07 MED ORDER — CEFAZOLIN SODIUM-DEXTROSE 1-4 GM/50ML-% IV SOLN
1.0000 g | Freq: Four times a day (QID) | INTRAVENOUS | Status: AC
  Administered 2022-09-07 – 2022-09-08 (×3): 1 g via INTRAVENOUS
  Filled 2022-09-07 (×3): qty 50

## 2022-09-07 MED ORDER — GUAIFENESIN-DM 100-10 MG/5ML PO SYRP
5.0000 mL | ORAL_SOLUTION | ORAL | Status: DC | PRN
  Administered 2022-09-07: 5 mL via ORAL
  Filled 2022-09-07: qty 5

## 2022-09-07 MED ORDER — SODIUM CHLORIDE 0.9% FLUSH: 3.0000 mL | INTRAVENOUS | Status: DC | PRN

## 2022-09-07 MED ORDER — LIDOCAINE HCL (PF) 1 % IJ SOLN
INTRAMUSCULAR | Status: DC | PRN
  Administered 2022-09-07: 60 mL

## 2022-09-07 MED ORDER — CEFAZOLIN IN SODIUM CHLORIDE 3-0.9 GM/100ML-% IV SOLN
3.0000 g | INTRAVENOUS | Status: AC
  Administered 2022-09-07: 3 g via INTRAVENOUS
  Filled 2022-09-07 (×2): qty 100

## 2022-09-07 SURGICAL SUPPLY — 14 items
CABLE SURGICAL S-101-97-12 (CABLE) ×1 IMPLANT
CATH RIGHTSITE C315HIS02 (CATHETERS) IMPLANT
IPG PACE AZUR XT DR MRI W1DR01 (Pacemaker) IMPLANT
LEAD CAPSURE NOVUS 5076-52CM (Lead) IMPLANT
LEAD SELECT SECURE 3830 383069 (Lead) IMPLANT
PACE AZURE XT DR MRI W1DR01 (Pacemaker) ×1 IMPLANT
PAD DEFIB RADIO PHYSIO CONN (PAD) ×1 IMPLANT
SELECT SECURE 3830 383069 (Lead) ×1 IMPLANT
SHEATH 7FR PRELUDE SNAP 13 (SHEATH) IMPLANT
SHEATH 7FR PRELUDE SNAP 25 (SHEATH) IMPLANT
SHEATH PROBE COVER 6X72 (BAG) IMPLANT
SLITTER 6232ADJ (MISCELLANEOUS) IMPLANT
TRAY PACEMAKER INSERTION (PACKS) ×1 IMPLANT
WIRE HI TORQ VERSACORE-J 145CM (WIRE) IMPLANT

## 2022-09-07 NOTE — H&P (View-Only) (Signed)
Rounding Note    Patient Name: Roy [name] Date of Encounter: 09/07/2022  [facility] Cardiologist: None   Subjective   Hard to sleep with the monitor ringing all night  Inpatient Medications    Scheduled Meds:  alfuzosin  10 mg Oral Q breakfast   fludrocortisone  0.1 mg Oral Daily   levothyroxine  100 mcg Oral Q0600   polyethylene glycol  17 g Oral BID   predniSONE  10 mg Oral Q breakfast   pregabalin  150 mg Oral BID   rosuvastatin  5 mg Oral QHS   valACYclovir  1,000 mg Oral Daily   Continuous Infusions:  PRN Meds: acetaminophen **OR** acetaminophen, guaiFENesin-dextromethorphan, ondansetron **OR** ondansetron (ZOFRAN) IV   Vital Signs    Vitals:   09/07/22 0352 09/07/22 0416 09/07/22 0518 09/07/22 0743  BP:   130/60   Pulse: (!) 50  (!) 27   Resp: 18  16   Temp: 97.6 F (36.4 C)   97.8 F (36.6 C)  TempSrc: Oral   Oral  SpO2: 99%  100%   Weight:  132.3 kg    Height:        Intake/Output Summary (Last 24 hours) at 09/07/2022 0853 Last data filed at 09/07/2022 1610 Gross per 24 hour  Intake 600 ml  Output 3150 ml  Net -2550 ml      09/07/2022    4:16 AM 09/06/2022    4:04 AM 08/26/2022    7:49 AM  Last 3 Weights  Weight (lbs) 291 lb 11.2 oz 291 lb 3.2 oz 270 lb  Weight (kg) 132.314 kg 132.087 kg 122.471 kg      Telemetry    AFib, vary regular and likely is CHB with junctional escape, one 5beat NSVT, 3 beat NSVT, couplet - Personally Reviewed  ECG    No new EKGs - Personally Reviewed  Physical Exam   GEN: No acute distress.   Neck: No JVD Cardiac: RRR, bradycardic, no murmurs, rubs, or gallops.  Respiratory: CTA b/l. GI: Soft, nontender, non-distended  MS: 1++ edema; No deformity. Neuro:  Nonfocal  Psych: Normal affect   Labs    High Sensitivity Troponin:   Recent Labs  Lab 09/05/22 1219 09/05/22 1422  TROPONINIHS 23* 19*     Chemistry Recent Labs  Lab 09/05/22 1219 09/06/22 0150 09/07/22 0148  NA 136 136 137  K  4.4 4.2 4.6  CL 101 99 96*  CO2 27 30 31   GLUCOSE 127* 119* 126*  BUN 17 18 24*  CREATININE 0.85 1.22 1.22  CALCIUM 9.2 9.3 9.5  GFRNONAA >60 >60 >60  ANIONGAP 8 7 10     Lipids No results for input(s): "CHOL", "TRIG", "HDL", "LABVLDL", "LDLCALC", "CHOLHDL" in the last 168 hours.  Hematology Recent Labs  Lab 09/05/22 1219 09/06/22 0150  WBC 9.2 10.4  RBC 4.84 4.73  HGB 14.9 14.4  HCT 46.1 44.9  MCV 95.2 94.9  MCH 30.8 30.4  MCHC 32.3 32.1  RDW 15.8* 15.5  PLT 164 156   Thyroid  Recent Labs  Lab 09/05/22 1422  TSH 1.541    BNP Recent Labs  Lab 09/05/22 1219  BNP 394.0*    DDimer No results for input(s): "DDIMER" in the last 168 hours.   Radiology      Cardiac Studies   09/06/22: TTE 1. Left ventricular ejection fraction, by estimation, is 60 to 65%. The  left ventricle has normal function. The left ventricle has no regional  wall motion  abnormalities. There is mild left ventricular hypertrophy.  Left ventricular diastolic function  could not be evaluated.   2. Right ventricular systolic function is normal. The right ventricular  size is mildly enlarged.   3. Left atrial size was mildly dilated.   4. The mitral valve is abnormal. Trivial mitral valve regurgitation.  Moderate mitral annular calcification.   5. The aortic valve is tricuspid. There is moderate calcification of the  aortic valve. Aortic valve regurgitation is trivial. Mild aortic valve  stenosis. Aortic valve mean gradient measures 26.0 mmHg. Aortic valve Vmax  measures 3.15 m/s.   6. Aortic dilatation noted. There is mild dilatation of the ascending  aorta, measuring 41 mm.   7. Rhythm strip during this exam demonstrates afib with slow ventricular  response - HR 30.   Comparison(s): No significant change from prior study. 01/09/2021: LVEF  55-60%, dilated aorta to 40 mm.   Patient Profile     72 y.o. male  with a hx of AFib, hypothyroidism, obesity, lymphoma, adrenal insufficiency (on  florinef and prednisone), Lymphona, admitted with symptomatic bradycardia  He has a R sided port 2/2 Stage IVb diffuse large B-cell lymphoma involving bone marrow, spleen  ast saw hematology 06/28/22 s/p 6 cycles of R-CHOP from 04/13/2021 through 08/14/2021 PET scan (09/22/2021): No evidence of metabolically active lymphoma. Posttreatment PET scan showed bilateral hypermetabolic parotid nodules, clinically consistent with Warthin's tumor's. - He has seen Dr. Alinda Sierras before but never followed up for biopsy.  H was admitted 09/05/22 with a week or so of unusual LE edema, progressive fatigue, weakness, and some dizziness. He was found in AFib w/SVR felt to represent junctional escape rhythm. Labs unremarkable/not contributing, on no nodal blocking agents  Assessment & Plan    Symptomatic bradycardia CHB with baseline QRS morphology AFib, unclear, appears persistent, perhaps longstanding persistent CHA2DS2Vasc is 2, on Eliquis held on admission for likely PPM  Stable BP, warm and well perfused No hx of syncope  Planned for PPM today Preserved LVEF RBBB, LAD (now and at baseline) He did have an EKG May last year with sinus so dual chamber planned Dr. Elberta Fortis has seen the patient reviewed indication, procedure with him, he remains agreeable to proceed    4.  Adrenal insufficiency Home florinef and prednisone continued     No reversible causes noted For questions or updates, please contact [facility] Please consult [email or web address] for contact info under        Signed, Sheilah Pigeon, PA-C  09/07/2022, 8:53 AM    I have seen and examined this patient with Roy [name].  Agree with above, note added to reflect my findings.  Feeling well today.  Has had a slight increase in ventricular ectopy overnight.  Did not sleep well due to telemetry alarms.  GEN: Well nourished, well developed, in no acute distress  HEENT: normal  Neck: no JVD, carotid bruits, or masses Cardiac:  Bradycardic, regular RRR; no murmurs, rubs, or gallops,no edema  Respiratory:  clear to auscultation bilaterally, normal work of breathing GI: soft, nontender, nondistended, + BS MS: no deformity or atrophy  Skin: warm and dry Neuro:  Strength and sensation are intact Psych: euthymic mood, full affect   Complete heart block: Patient Syd Newsome need pacemaker implant.  If there is time, we Kaedyn Belardo plan for implant today.  No reversible causes for his heart block.  Risk and benefits have been discussed.  Risk include bleeding, tamponade, infection, pneumothorax, lead dislodgment, MI, renal failure, stroke, death.  He understands these risks and is agreed to the procedure.  Sumner Kirchman M. Batul Diego MD 09/07/2022 9:45 AM

## 2022-09-07 NOTE — Discharge Instructions (Signed)
     Supplemental Discharge Instructions for  Pacemaker/Defibrillator Patients    Activity No heavy lifting or vigorous activity with your left/right arm for 6 to 8 weeks.  Do not raise your left/right arm above your head for one week.  Gradually raise your affected arm as drawn below.             09/12/22                       09/13/22                     09/14/22                   09/15/22 __  NO DRIVING until cleared to at your wound check visit.  WOUND CARE Keep the wound area clean and dry.  Do not get this area wet , no showers until cleared to at your wound check visit . The tape/steri-strips on your wound will fall off; do not pull them off.  No bandage is needed on the site.  DO  NOT apply any creams, oils, or ointments to the wound area. If you notice any drainage or discharge from the wound, any swelling or bruising at the site, or you develop a fever > 101? F after you are discharged home, call the office at once.  Special Instructions You are still able to use cellular telephones; use the ear opposite the side where you have your pacemaker/defibrillator.  Avoid carrying your cellular phone near your device. When traveling through airports, show security personnel your identification card to avoid being screened in the metal detectors.  Ask the security personnel to use the hand wand. Avoid arc welding equipment, MRI testing (magnetic resonance imaging), TENS units (transcutaneous nerve stimulators).  Call the office for questions about other devices. Avoid electrical appliances that are in poor condition or are not properly grounded. Microwave ovens are safe to be near or to operate.

## 2022-09-07 NOTE — Progress Notes (Signed)
  Transition of Care [facility]) Screening Note   Patient Details  Name: Roy [name] Date of Birth: September 01, 1950   Transition of Care [facility]) CM/SW Contact:    Darrold Span, RN Phone Number: 09/07/2022, 3:03 PM    Transition of Care Department [facility]) has reviewed patient and no TOC needs have been identified at this time. Note plan for PPM- EP following.  We will continue to monitor patient advancement through interdisciplinary progression rounds. If new patient transition needs arise, please place a TOC consult.

## 2022-09-07 NOTE — Progress Notes (Signed)
PROGRESS NOTE Roy [name]  ZOX:[id number] DOB: 27-Aug-1950 DOA: 09/05/2022 PCP: Rebecka Apley, NP  Brief Narrative/Hospital Course: 72 y.o.m w/ Hypertension, B-cell lymphoma, PAF on Eliquis presented w/ bilateral lower extremity swelling up to his thighs over the past week, generalized weakness/fatigue,abdominal bloating, constipation but no chest pain/difficulty breathing and recent cough and congestion symptoms a week ago for which she was prescribed a course of steroid  In the ED: Severe bradycardia 32-36, rest of vitals stable, BNP up at 394. CXR-enlarged cardiopericardial silhouette, with vascular congestion. EKG shows junctional rhythm versus atrial fibrillation rate of 34. TSH 1.5.  Troponin 23> 19. Cardiology was consulted and given a dose of Lasix and transferred to [facility] from [facility] for EP evaluation.      Subjective: Patient seen and examined this morning resting comfortably in the bedside chair, complains of leg edema No chest pain nausea vomiting shortness of breath. He would like to have pacemaker done today  Assessment and Plan: Principal Problem:   Symptomatic bradycardia Active Problems:   Atrial fibrillation (HCC)   Trifascicular block   Chronic anticoagulation   Acute congestive heart failure (HCC)   Large B-cell lymphoma (HCC)   Essential hypertension   Adrenal insufficiency (HCC)   BPH (benign prostatic hyperplasia)  Complete heart block Symptomatic bradycardia Long history of conduction disease/trifascicular block in 2016 Now w/ A-fib with junctional escape rhythm in 30s with chronic RBBB/LAFB: TSH stable.  Not on AV nodal blocking agent.  Seen by EP and planning for PPM placement today.  Continue monitoring telemetry.  Echo with preserved EF.Eliquis on hold  Mildly elevated troponin but flat: Suspect in the setting of bradycardia/CHF  PAF: Bradycardia as above, holding anticoagulation for pacemaker.  Resume once okay with cardiology  Leg edema,  concern for congestive heart failure on admission but EF is normal-Got Lasix on 5/12 holding further Lasix until pacemaker placement   Essential hypertension-stable. On amlodipine losartan- HOLD CCB.  Large B-cell lymphoma:Follows with Dr. Ellin Saba.  Status post chemotherapy.  Pet scan 09/2021- no evidence of metabolically active lymphoma.  BPH  cont alfuzosin  Adrenal insufficiency on chronic prednisone and Florinef, continue the same  Class II Obesity:Patient's Body mass index is 39.56 kg/m. : Will benefit with PCP follow-up, weight loss  healthy lifestyle and outpatient sleep evaluation.   DVT prophylaxis: SCDs Start: 09/05/22 1831 Code Status:   Code Status: Full Code Family Communication: plan of care discussed with patient at bedside. Patient status is:  inpatient because of bradycardia Level of care: Progressive   Dispo: The patient is from: Home            Anticipated disposition: TBD pending PPM  Objective: Vitals last 24 hrs: Vitals:   09/07/22 0352 09/07/22 0416 09/07/22 0518 09/07/22 0743  BP:   130/60   Pulse: (!) 50  (!) 27   Resp: 18  16   Temp: 97.6 F (36.4 C)   97.8 F (36.6 C)  TempSrc: Oral   Oral  SpO2: 99%  100%   Weight:  132.3 kg    Height:       Weight change: 0.227 kg  Physical Examination: General exam: AA,ox3 weak,older appearing HEENT:Oral mucosa moist, Ear/Nose WNL grossly, dentition normal. Respiratory system: bilaterally clear BS, no use of accessory muscle Cardiovascular system: S1 & S2 +, regular rate,. Gastrointestinal system: Abdomen soft, NT,ND,BS+ Nervous System:Alert, awake, moving extremities and grossly nonfocal Extremities: LE ankle edema ++, lower extremities warm Skin: No rashes,no icterus. MSK: Normal muscle bulk,tone, power  Medications reviewed:  Scheduled Meds:  alfuzosin  10 mg Oral Q breakfast   fludrocortisone  0.1 mg Oral Daily   levothyroxine  100 mcg Oral Q0600   polyethylene glycol  17 g Oral BID    predniSONE  10 mg Oral Q breakfast   pregabalin  150 mg Oral BID   rosuvastatin  5 mg Oral QHS   sodium chloride flush  3 mL Intravenous Q12H   valACYclovir  1,000 mg Oral Daily  Continuous Infusions:  Diet Order             Diet NPO time specified Except for: Sips with Meds  Diet effective midnight                  Intake/Output Summary (Last 24 hours) at 09/07/2022 8295 Last data filed at 09/07/2022 6213 Gross per 24 hour  Intake 600 ml  Output 2825 ml  Net -2225 ml    Net IO Since Admission: -4,770 mL [09/07/22 0925]  Wt Readings from Last 3 Encounters:  09/07/22 132.3 kg  08/26/22 122.5 kg  07/12/22 128 kg    Unresulted Labs (From admission, onward)     Start     Ordered   09/06/22 0500  Basic metabolic panel  Daily,   R      09/05/22 1830   Signed and Held  Surgical PCR screen  (Screening)  Once,   R        Signed and Held          Data Reviewed: I have personally reviewed following labs and imaging studies CBC: Recent Labs  Lab 09/05/22 1219 09/06/22 0150  WBC 9.2 10.4  NEUTROABS 7.3  --   HGB 14.9 14.4  HCT 46.1 44.9  MCV 95.2 94.9  PLT 164 156    Basic Metabolic Panel: Recent Labs  Lab 09/05/22 1219 09/06/22 0150 09/07/22 0148  NA 136 136 137  K 4.4 4.2 4.6  CL 101 99 96*  CO2 27 30 31   GLUCOSE 127* 119* 126*  BUN 17 18 24*  CREATININE 0.85 1.22 1.22  CALCIUM 9.2 9.3 9.5   CBG: Recent Labs  Lab 09/05/22 1245  GLUCAP 130*    Recent Labs    09/05/22 1422  TSH 1.541    Sepsis Labs: No results for input(s): "PROCALCITON", "LATICACIDVEN" in the last 168 hours.  Recent Results (from the past 240 hour(s))  SARS Coronavirus 2 by RT PCR (hospital order, performed in [facility] hospital lab) *cepheid single result test* Anterior Nasal Swab     Status: None   Collection Time: 09/05/22 12:32 PM   Specimen: Anterior Nasal Swab  Result Value Ref Range Status   SARS Coronavirus 2 by RT PCR NEGATIVE NEGATIVE Final    Comment:  (NOTE) SARS-CoV-2 target nucleic acids are NOT DETECTED.  The SARS-CoV-2 RNA is generally detectable in upper and lower respiratory specimens during the acute phase of infection. The lowest concentration of SARS-CoV-2 viral copies this assay can detect is 250 copies / mL. A negative result does not preclude SARS-CoV-2 infection and should not be used as the sole basis for treatment or other patient management decisions.  A negative result may occur with improper specimen collection / handling, submission of specimen other than nasopharyngeal swab, presence of viral mutation(s) within the areas targeted by this assay, and inadequate number of viral copies (<250 copies / mL). A negative result must be combined with clinical observations, patient history, and epidemiological information.  Fact  Sheet for Patients:   RoadLapTop.co.za  Fact Sheet for Healthcare Providers: http://kim-miller.com/  This test is not yet approved or  cleared by the [place] FDA and has been authorized for detection and/or diagnosis of SARS-CoV-2 by FDA under an Emergency Use Authorization (EUA).  This EUA will remain in effect (meaning this test can be used) for the duration of the COVID-19 declaration under Section 564(b)(1) of the Act, 21 U.S.C. section 360bbb-3(b)(1), unless the authorization is terminated or revoked sooner.  Performed at [facility], [address], [place], [place] [address]     Antimicrobials: Anti-infectives (From admission, onward)    Start     Dose/Rate Route Frequency Ordered Stop   09/06/22 1000  valACYclovir (VALTREX) tablet 1,000 mg        1,000 mg Oral Daily 09/05/22 1830        Culture/Microbiology    Component Value Date/Time   SDES  07/13/2021 2119    URINE, CLEAN CATCH Performed at [facility], [address], [place], [place] [address]    [facility]  07/13/2021 2119    NONE Performed at [facility],  [address], [place], [place] [address]    CULT MULTIPLE SPECIES PRESENT, SUGGEST RECOLLECTION (A) 07/13/2021 2119   REPTSTATUS 07/15/2021 FINAL 07/13/2021 2119   Radiology Studies: ECHOCARDIOGRAM COMPLETE  Result Date: 09/06/2022    ECHOCARDIOGRAM REPORT   Patient Name:   Roy [name] Date of Exam: 09/06/2022 Medical Rec #:  [id number]      Height:       72.0 in Accession #:    [id number]     Weight:       291.2 lb Date of Birth:  [birth year]     BSA:          2.499 m Patient Age:    71 years       BP:           144/66 mmHg Patient Gender: M              HR:           31 bpm. Exam Location:  Inpatient Procedure: 2D Echo, Cardiac Doppler, Color Doppler and Intracardiac            Opacification Agent Indications:    Dyspnea  History:        Patient has prior history of Echocardiogram examinations, most                 recent 01/09/2021. CHF, Arrythmias:Atrial Fibrillation and                 Bradycardia; Signs/Symptoms:Dyspnea.  Sonographer:    Raeford Razor Referring Phys: [id number] Onnie Boer  Sonographer Comments: No subcostal window. Image acquisition challenging due to patient body habitus. IMPRESSIONS  1. Left ventricular ejection fraction, by estimation, is 60 to 65%. The left ventricle has normal function. The left ventricle has no regional wall motion abnormalities. There is mild left ventricular hypertrophy. Left ventricular diastolic function could not be evaluated.  2. Right ventricular systolic function is normal. The right ventricular size is mildly enlarged.  3. Left atrial size was mildly dilated.  4. The mitral valve is abnormal. Trivial mitral valve regurgitation. Moderate mitral annular calcification.  5. The aortic valve is tricuspid. There is moderate calcification of the aortic valve. Aortic valve regurgitation is trivial. Mild aortic valve stenosis. Aortic valve mean gradient measures 26.0 mmHg. Aortic valve Vmax measures 3.15 m/s.  6. Aortic dilatation noted. There  is mild dilatation of the  ascending aorta, measuring 41 mm.  7. Rhythm strip during this exam demonstrates afib with slow ventricular response - HR 30. Comparison(s): No significant change from prior study. 01/09/2021: LVEF 55-60%, dilated aorta to 40 mm. FINDINGS  Left Ventricle: Left ventricular ejection fraction, by estimation, is 60 to 65%. The left ventricle has normal function. The left ventricle has no regional wall motion abnormalities. Definity contrast agent was given IV to delineate the left ventricular  endocardial borders. The left ventricular internal cavity size was normal in size. There is mild left ventricular hypertrophy. Left ventricular diastolic function could not be evaluated due to atrial fibrillation. Left ventricular diastolic function could not be evaluated. Right Ventricle: The right ventricular size is mildly enlarged. No increase in right ventricular wall thickness. Right ventricular systolic function is normal. Left Atrium: Left atrial size was mildly dilated. Right Atrium: Right atrial size was normal in size. Pericardium: There is no evidence of pericardial effusion. Mitral Valve: The mitral valve is abnormal. Moderate mitral annular calcification. Trivial mitral valve regurgitation. Tricuspid Valve: The tricuspid valve is grossly normal. Tricuspid valve regurgitation is mild. Aortic Valve: The aortic valve is tricuspid. There is moderate calcification of the aortic valve. Aortic valve regurgitation is trivial. Mild aortic stenosis is present. Aortic valve mean gradient measures 26.0 mmHg. Aortic valve peak gradient measures 39.7 mmHg. Aortic valve area, by VTI measures 2.15 cm. Pulmonic Valve: The pulmonic valve was grossly normal. Pulmonic valve regurgitation is trivial. Aorta: Aortic dilatation noted. There is mild dilatation of the ascending aorta, measuring 41 mm. IAS/Shunts: The interatrial septum was not well visualized. EKG: Rhythm strip during this exam demonstrates afib with slow ventricular response  - HR 30.  LEFT VENTRICLE PLAX 2D LVIDd:         4.80 cm LVIDs:         4.00 cm LV PW:         1.20 cm LV IVS:        1.20 cm LVOT diam:     2.40 cm LV SV:         151 LV SV Index:   60 LVOT Area:     4.52 cm  RIGHT VENTRICLE TAPSE (M-mode): 2.2 cm LEFT ATRIUM         Index LA diam:    4.60 cm 1.84 cm/m  AORTIC VALVE AV Area (Vmax):    1.84 cm AV Area (Vmean):   1.62 cm AV Area (VTI):     2.15 cm AV Vmax:           315.00 cm/s AV Vmean:          246.000 cm/s AV VTI:            0.701 m AV Peak Grad:      39.7 mmHg AV Mean Grad:      26.0 mmHg LVOT Vmax:         128.10 cm/s LVOT Vmean:        88.200 cm/s LVOT VTI:          0.333 m LVOT/AV VTI ratio: 0.48 AR Vena Contracta: 0.30 cm  AORTA Ao Root diam: 4.10 cm TRICUSPID VALVE TR Peak grad:   26.6 mmHg TR Vmax:        258.00 cm/s  SHUNTS Systemic VTI:  0.33 m Systemic Diam: 2.40 cm Zoila Shutter MD Electronically signed by Zoila Shutter MD Signature Date/Time: 09/06/2022/11:28:11 AM    Final    DG Chest 2  View  Result Date: 09/05/2022 CLINICAL DATA:  Shortness of breath EXAM: CHEST - 2 VIEW COMPARISON:  X-ray 08/26/2022 FINDINGS: Enlarged cardiopericardial silhouette. Calcified aorta. Vascular congestion. No pneumothorax, effusion or edema. Right IJ chest port with tip along the central SVC above the right atrium. Film is under penetrated degenerative changes of the spine. Surgical clips in the right upper abdomen IMPRESSION: Right IJ chest port. Enlarged cardiopericardial silhouette with a vascular congestion. Electronically Signed   By: Karen Kays M.D.   On: 09/05/2022 12:46     LOS: 2 days   Lanae Boast, MD [facility]  09/07/2022, 9:25 AM

## 2022-09-07 NOTE — Progress Notes (Signed)
Patient setting in chair with no complaints had 5 beats V-Tach then back to At fib. Cont. To monitor patient and rhythm

## 2022-09-07 NOTE — Progress Notes (Addendum)
 Rounding Note    Patient Name: Roy [name] Date of Encounter: 09/07/2022  [facility] Cardiologist: None   Subjective   Hard to sleep with the monitor ringing all night  Inpatient Medications    Scheduled Meds:  alfuzosin  10 mg Oral Q breakfast   fludrocortisone  0.1 mg Oral Daily   levothyroxine  100 mcg Oral Q0600   polyethylene glycol  17 g Oral BID   predniSONE  10 mg Oral Q breakfast   pregabalin  150 mg Oral BID   rosuvastatin  5 mg Oral QHS   valACYclovir  1,000 mg Oral Daily   Continuous Infusions:  PRN Meds: acetaminophen **OR** acetaminophen, guaiFENesin-dextromethorphan, ondansetron **OR** ondansetron (ZOFRAN) IV   Vital Signs    Vitals:   09/07/22 0352 09/07/22 0416 09/07/22 0518 09/07/22 0743  BP:   130/60   Pulse: (!) 50  (!) 27   Resp: 18  16   Temp: 97.6 F (36.4 C)   97.8 F (36.6 C)  TempSrc: Oral   Oral  SpO2: 99%  100%   Weight:  132.3 kg    Height:        Intake/Output Summary (Last 24 hours) at 09/07/2022 0853 Last data filed at 09/07/2022 0803 Gross per 24 hour  Intake 600 ml  Output 3150 ml  Net -2550 ml      09/07/2022    4:16 AM 09/06/2022    4:04 AM 08/26/2022    7:49 AM  Last 3 Weights  Weight (lbs) 291 lb 11.2 oz 291 lb 3.2 oz 270 lb  Weight (kg) 132.314 kg 132.087 kg 122.471 kg      Telemetry    AFib, vary regular and likely is CHB with junctional escape, one 5beat NSVT, 3 beat NSVT, couplet - Personally Reviewed  ECG    No new EKGs - Personally Reviewed  Physical Exam   GEN: No acute distress.   Neck: No JVD Cardiac: RRR, bradycardic, no murmurs, rubs, or gallops.  Respiratory: CTA b/l. GI: Soft, nontender, non-distended  MS: 1++ edema; No deformity. Neuro:  Nonfocal  Psych: Normal affect   Labs    High Sensitivity Troponin:   Recent Labs  Lab 09/05/22 1219 09/05/22 1422  TROPONINIHS 23* 19*     Chemistry Recent Labs  Lab 09/05/22 1219 09/06/22 0150 09/07/22 0148  NA 136 136 137  K  4.4 4.2 4.6  CL 101 99 96*  CO2 27 30 31  GLUCOSE 127* 119* 126*  BUN 17 18 24*  CREATININE 0.85 1.22 1.22  CALCIUM 9.2 9.3 9.5  GFRNONAA >60 >60 >60  ANIONGAP 8 7 10    Lipids No results for input(s): "CHOL", "TRIG", "HDL", "LABVLDL", "LDLCALC", "CHOLHDL" in the last 168 hours.  Hematology Recent Labs  Lab 09/05/22 1219 09/06/22 0150  WBC 9.2 10.4  RBC 4.84 4.73  HGB 14.9 14.4  HCT 46.1 44.9  MCV 95.2 94.9  MCH 30.8 30.4  MCHC 32.3 32.1  RDW 15.8* 15.5  PLT 164 156   Thyroid  Recent Labs  Lab 09/05/22 1422  TSH 1.541    BNP Recent Labs  Lab 09/05/22 1219  BNP 394.0*    DDimer No results for input(s): "DDIMER" in the last 168 hours.   Radiology      Cardiac Studies   09/06/22: TTE 1. Left ventricular ejection fraction, by estimation, is 60 to 65%. The  left ventricle has normal function. The left ventricle has no regional  wall motion   abnormalities. There is mild left ventricular hypertrophy.  Left ventricular diastolic function  could not be evaluated.   2. Right ventricular systolic function is normal. The right ventricular  size is mildly enlarged.   3. Left atrial size was mildly dilated.   4. The mitral valve is abnormal. Trivial mitral valve regurgitation.  Moderate mitral annular calcification.   5. The aortic valve is tricuspid. There is moderate calcification of the  aortic valve. Aortic valve regurgitation is trivial. Mild aortic valve  stenosis. Aortic valve mean gradient measures 26.0 mmHg. Aortic valve Vmax  measures 3.15 m/s.   6. Aortic dilatation noted. There is mild dilatation of the ascending  aorta, measuring 41 mm.   7. Rhythm strip during this exam demonstrates afib with slow ventricular  response - HR 30.   Comparison(s): No significant change from prior study. 01/09/2021: LVEF  55-60%, dilated aorta to 40 mm.   Patient Profile     71 y.o. male  with a hx of AFib, hypothyroidism, obesity, lymphoma, adrenal insufficiency (on  florinef and prednisone), [place], admitted with symptomatic bradycardia  He has a R sided port 2/2 Stage IVb diffuse large B-cell lymphoma involving bone marrow, spleen  ast saw hematology 06/28/22 s/p 6 cycles of R-CHOP from 04/13/2021 through 08/14/2021 PET scan (09/22/2021): No evidence of metabolically active lymphoma. Posttreatment PET scan showed bilateral hypermetabolic parotid nodules, clinically consistent with Warthin's tumor's. - He has seen Dr. SkottNicki before but never followed up for biopsy.  [name] was admitted 09/05/22 with a week or so of unusual LE edema, progressive fatigue, weakness, and some dizziness. He was found in AFib w/SVR felt to represent junctional escape rhythm. Labs unremarkable/not contributing, on no nodal blocking agents  Assessment & Plan    Symptomatic bradycardia CHB with baseline QRS morphology AFib, unclear, appears persistent, perhaps longstanding persistent CHA2DS2Vasc is 2, on Eliquis held on admission for likely PPM  Stable BP, warm and well perfused No hx of syncope  Planned for PPM today Preserved LVEF RBBB, LAD (now and at baseline) He did have an EKG May last year with sinus so dual chamber planned Dr. Iza Preston has seen the patient reviewed indication, procedure with him, he remains agreeable to proceed    4.  Adrenal insufficiency Home florinef and prednisone continued     No reversible causes noted For questions or updates, please contact [facility] Please consult [email or web address] for contact info under        Signed, Renee Lynn Ursuy, PA-C  09/07/2022, 8:53 AM    I have seen and examined this patient with Renee Ursuy.  Agree with above, note added to reflect my findings.  Feeling well today.  Has had a slight increase in ventricular ectopy overnight.  Did not sleep well due to telemetry alarms.  GEN: Well nourished, well developed, in no acute distress  HEENT: normal  Neck: no JVD, carotid bruits, or masses Cardiac:  Bradycardic, regular RRR; no murmurs, rubs, or gallops,no edema  Respiratory:  clear to auscultation bilaterally, normal work of breathing GI: soft, nontender, nondistended, + BS MS: no deformity or atrophy  Skin: warm and dry Neuro:  Strength and sensation are intact Psych: euthymic mood, full affect   Complete heart block: Patient Roy Hoque need pacemaker implant.  If there is time, we Jaasiel [place] plan for implant today.  No reversible causes for his heart block.  Risk and benefits have been discussed.  Risk include bleeding, tamponade, infection, pneumothorax, lead dislodgment, MI, renal failure, stroke, death.    He understands these risks and is agreed to the procedure.  Rhaelyn Giron M. Johnny Latu MD 09/07/2022 9:45 AM  

## 2022-09-07 NOTE — Interval H&P Note (Signed)
History and Physical Interval Note:  09/07/2022 5:16 PM  Roy [name]  has presented today for surgery, with the diagnosis of complete heart block.  The various methods of treatment have been discussed with the patient and family. After consideration of risks, benefits and other options for treatment, the patient has consented to  Procedure(s): PACEMAKER IMPLANT (N/A) as a surgical intervention.  The patient's history has been reviewed, patient examined, no change in status, stable for surgery.  I have reviewed the patient's chart and labs.  Questions were answered to the patient's satisfaction.     Chelli Yerkes [employer]

## 2022-09-08 ENCOUNTER — Inpatient Hospital Stay (HOSPITAL_COMMUNITY): Payer: 59

## 2022-09-08 DIAGNOSIS — R001 Bradycardia, unspecified: Secondary | ICD-10-CM | POA: Diagnosis not present

## 2022-09-08 LAB — BASIC METABOLIC PANEL
Anion gap: 10 (ref 5–15)
BUN: 21 mg/dL (ref 8–23)
CO2: 31 mmol/L (ref 22–32)
Calcium: 8.8 mg/dL — ABNORMAL LOW (ref 8.9–10.3)
Chloride: 96 mmol/L — ABNORMAL LOW (ref 98–111)
Creatinine, Ser: 0.92 mg/dL (ref 0.61–1.24)
GFR, Estimated: >60 mL/min (ref >60)
Glucose, Bld: 96 mg/dL (ref 70–99)
Potassium: 3.4 mmol/L — ABNORMAL LOW (ref 3.5–5.1)
Sodium: 137 mmol/L (ref 135–145)

## 2022-09-08 MED ORDER — ALUM & MAG HYDROXIDE-SIMETH 200-200-20 MG/5ML PO SUSP
30.0000 mL | Freq: Four times a day (QID) | ORAL | Status: DC | PRN
  Administered 2022-09-08: 30 mL via ORAL
  Filled 2022-09-08: qty 30

## 2022-09-08 MED ORDER — POTASSIUM CHLORIDE CRYS ER 20 MEQ PO TBCR
40.0000 meq | EXTENDED_RELEASE_TABLET | Freq: Once | ORAL | Status: AC
  Administered 2022-09-08: 40 meq via ORAL
  Filled 2022-09-08: qty 2

## 2022-09-08 MED ORDER — ELIQUIS 5 MG PO TABS: 5.0000 mg | ORAL_TABLET | Freq: Two times a day (BID) | ORAL | Status: DC

## 2022-09-08 NOTE — Plan of Care (Signed)
  Problem: Health Behavior/Discharge Planning: Goal: Ability to manage health-related needs will improve Outcome: Adequate for Discharge   

## 2022-09-08 NOTE — Progress Notes (Signed)
   Rounding Note    Patient Name: Roy [name] Date of Encounter: 09/08/2022  [facility] Cardiologist: None   Subjective   Doing well after pacemaker implant yesterday.  Vital Signs    Vitals:   09/08/22 0019 09/08/22 0416 09/08/22 0719 09/08/22 1154  BP: 127/68 (!) 154/82 (!) 145/79 106/65  Pulse: 60 60 60 60  Resp: 14 18 20 16   Temp: 98.4 F (36.9 C) 97.6 F (36.4 C) 97.6 F (36.4 C) (!) 97.5 F (36.4 C)  TempSrc: Oral Oral Oral Oral  SpO2: 94% 94% 96% 100%  Weight:      Height:        Intake/Output Summary (Last 24 hours) at 09/08/2022 1211 Last data filed at 09/08/2022 1155 Gross per 24 hour  Intake 340 ml  Output 2815 ml  Net -2475 ml      09/07/2022    4:16 AM 09/06/2022    4:04 AM 08/26/2022    7:49 AM  Last 3 Weights  Weight (lbs) 291 lb 11.2 oz 291 lb 3.2 oz 270 lb  Weight (kg) 132.314 kg 132.087 kg 122.471 kg      Telemetry    Personally Reviewed  ECG    Personally Reviewed  Atrial fibrillation with V pacing  Physical Exam    GEN: No acute distress.   Cardiac: RRR, no murmurs, rubs, or gallops.  Prepectoral pocket healing well. Respiratory: Clear to auscultation bilaterally. Psych: Normal affect   Assessment & Plan    #Symptomatic bradycardia, complete heart block #Permanent pacemaker Device functioning appropriately.  Chest x-ray and device interrogation shows stable lead function and position. Okay to discharge.  #Atrial fibrillation Hold Eliquis for 5 days.  Okay to restart Sep 13, 2022.   Okay to discharge.   Sheria Lang T. Lalla Brothers, MD, [facility], [facility] Cardiac Electrophysiology

## 2022-09-08 NOTE — Discharge Summary (Signed)
Physician Discharge Summary  Roy [name] WUJ:[id number] DOB: 1951/02/17 DOA: 09/05/2022  PCP: Rebecka Apley, NP  Admit date: 09/05/2022 Discharge date: 09/08/2022 Recommendations for Outpatient Follow-up:  Follow up with PCP in 1 weeks-call for appointment Please obtain BMP/CBC in one week Resume Eliquis on May 9 Continue to hold her antihypertensive and discuss with her primary care doctor when to resume them after checking blood pressure.  Discharge Dispo: Home Discharge Condition: Stable Code Status:   Code Status: Full Code Diet recommendation:  Diet Order             Diet Heart Room service appropriate? Yes; Fluid consistency: Thin  Diet effective now                    Brief/Interim Summary: 72 y.o.m w/ Hypertension, B-cell lymphoma, PAF on Eliquis presented w/ bilateral lower extremity swelling up to his thighs over the past week, generalized weakness/fatigue,abdominal bloating, constipation but no chest pain/difficulty breathing and recent cough and congestion symptoms a week ago for which she was prescribed a course of steroid In the ED: Severe bradycardia 32-36, rest of vitals stable, BNP up at 394. CXR-enlarged cardiopericardial silhouette, with vascular congestion. EKG shows junctional rhythm versus atrial fibrillation rate of 34. TSH 1.5.  Troponin 23> 19. Cardiology was consulted and given a dose of Lasix and transferred to [facility] from [facility] for EP evaluation. Seen by cardiology felt to be complete heart block and symptomatic underlying pacemaker placement 09/07/2022 postprocedure doing well leg edema has improved, eager to go home.  Chest x-ray well-positioned left anterior chest wall dual-lead pacemaker.    Discharge Diagnoses:  Principal Problem:   Symptomatic bradycardia Active Problems:   Atrial fibrillation (HCC)   Trifascicular block   Chronic anticoagulation   Acute congestive heart failure (HCC)   Large B-cell lymphoma (HCC)   Essential hypertension    Adrenal insufficiency (HCC)   BPH (benign prostatic hyperplasia)  Complete heart block Symptomatic bradycardia Long history of conduction disease/trifascicular block in 2016 Now w/ A-fib with junctional escape rhythm in 30s with chronic RBBB/LAFB: TSH stable.  Not on AV nodal blocking agent.  Seen by EP s/p PPM placement 5/3> post procedure doing well chest x-ray appears stable 5/4.  Discussed with EP cardiology and okay for discharge home today today.    Mildly elevated troponin but flat: Suspect in the setting of bradycardia/CHF   PAF: Bradycardia as above, holding anticoagulation for pacemaker.  Discussed with EP cardiologist today advised to hold anticoagulation till May 9.    Leg edema, concern for congestive heart failure on admission but EF is normal-Got Lasix on 5/12 holding further Lasix.  Low edema improved   Essential hypertension-stable. On amlodipine losartan- HOLD CCB.   Large B-cell lymphoma:Follows with Dr. Ellin Saba.  Status post chemotherapy.  Pet scan 09/2021- no evidence of metabolically active lymphoma.   BPH  cont alfuzosin   Adrenal insufficiency on chronic prednisone and Florinef, continue the same   Class II Obesity:Patient's Body mass index is 39.56 kg/m. : Will benefit with PCP follow-up, weight loss  healthy lifestyle and outpatient sleep evaluation.  Consults: Cardiology and EP Subjective: Alert oriented resting comfortably leg swelling has improved, eager to go home today soon.  Discharge Exam: Vitals:   09/08/22 0719 09/08/22 1154  BP: (!) 145/79 106/65  Pulse: 60 60  Resp: 20 16  Temp: 97.6 F (36.4 C) (!) 97.5 F (36.4 C)  SpO2: 96% 100%   General: Pt is alert, awake, not  in acute distress Cardiovascular: RRR, S1/S2 +, no rubs, no gallops Respiratory: CTA bilaterally, no wheezing, no rhonchi Abdominal: Soft, NT, ND, bowel sounds + Extremities: no edema, no cyanosis  Discharge Instructions  Discharge Instructions     Discharge  instructions   Complete by: As directed    Please call call MD or return to ER for similar or worsening recurring problem that brought you to hospital or if any fever,nausea/vomiting,abdominal pain, uncontrolled pain, chest pain,  shortness of breath or any other alarming symptoms.  Please follow-up your doctor as instructed in a week time and call the office for appointment.  Resume your ELIQUIS on may 9, cont to hold your Blood Pressure medication and see you pcp Monday to resume  Please avoid alcohol, smoking, or any other illicit substance and maintain healthy habits including taking your regular medications as prescribed.  You were cared for by a hospitalist during your hospital stay. If you have any questions about your discharge medications or the care you received while you were in the hospital after you are discharged, you can call the unit and ask to speak with the hospitalist on call if the hospitalist that took care of you is not available.  Once you are discharged, your primary care physician will handle any further medical issues. Please note that NO REFILLS for any discharge medications will be authorized once you are discharged, as it is imperative that you return to your primary care physician (or establish a relationship with a primary care physician if you do not have one) for your aftercare needs so that they can reassess your need for medications and monitor your lab values   Increase activity slowly   Complete by: As directed       Allergies as of 09/08/2022       Reactions   Adhesive [tape]    Contact dermatitis   Sulfa Antibiotics    UNKNOWN Childhood reaction        Medication List     STOP taking these medications    amLODipine 5 MG tablet Commonly known as: NORVASC   cefpodoxime 200 MG tablet Commonly known as: VANTIN   losartan 25 MG tablet Commonly known as: COZAAR       TAKE these medications    acetaminophen 325 MG tablet Commonly known  as: TYLENOL Take 2 tablets (650 mg total) by mouth every 6 (six) hours as needed for headache.   alfuzosin 10 MG 24 hr tablet Commonly known as: UROXATRAL TAKE ONE TABLET DAILY WITH BREAKFAST   cyanocobalamin 1000 MCG tablet Commonly known as: VITAMIN B12 Take 1 tablet (1,000 mcg total) by mouth daily.   Eliquis 5 MG Tabs tablet Generic drug: apixaban Take 1 tablet (5 mg total) by mouth 2 (two) times daily. Resume from may 9 Start taking on: Sep 13, 2022 What changed:  additional instructions These instructions start on Sep 13, 2022. If you are unsure what to do until then, ask your doctor or other care provider.   EQUATE STOOL SOFTENER 100 MG capsule Generic drug: docusate sodium Take 2 capsules by mouth twice daily   FeroSul 325 (65 FE) MG tablet Generic drug: ferrous sulfate Take 1 tablet by mouth once daily with breakfast   fludrocortisone 0.1 MG tablet Commonly known as: FLORINEF TAKE ONE TABLET ONCE DAILY WITH BREAKFAST   levocetirizine 5 MG tablet Commonly known as: XYZAL Take 5 mg by mouth every evening.   levothyroxine 100 MCG tablet Commonly known as: SYNTHROID TAKE  ONE TABLET ONCE DAILY BEFORE BREAKFAST   magnesium oxide 400 (240 Mg) MG tablet Commonly known as: MAG-OX Take 400 mg by mouth daily.   omeprazole 10 MG capsule Commonly known as: PRILOSEC Take 20 mg by mouth daily.   potassium chloride 10 MEQ tablet Commonly known as: KLOR-CON TAKE TWO TABLETS TWICE DAILY   predniSONE 10 MG tablet Commonly known as: DELTASONE TAKE ONE TABLET ONCE DAILY WITH BREAKFAST   pregabalin 150 MG capsule Commonly known as: LYRICA Take 150 mg by mouth 2 (two) times daily.   rosuvastatin 5 MG tablet Commonly known as: CRESTOR Take 1 tablet by mouth at bedtime.   valACYclovir 1000 MG tablet Commonly known as: VALTREX Take 1,000 mg by mouth daily.        Follow-up Information     Hemberg, Ruby Cola, NP Follow up in 1 week(s).   Specialty: Adult Health  Nurse Practitioner Contact information: [address] [place] [address] [phone]                Allergies  Allergen Reactions   Adhesive [Tape]     Contact dermatitis   Sulfa Antibiotics     UNKNOWN Childhood reaction    The results of significant diagnostics from this hospitalization (including imaging, microbiology, ancillary and laboratory) are listed below for reference.    Microbiology: Recent Results (from the past 240 hour(s))  SARS Coronavirus 2 by RT PCR (hospital order, performed in [facility] hospital lab) *cepheid single result test* Anterior Nasal Swab     Status: None   Collection Time: 09/05/22 12:32 PM   Specimen: Anterior Nasal Swab  Result Value Ref Range Status   SARS Coronavirus 2 by RT PCR NEGATIVE NEGATIVE Final    Comment: (NOTE) SARS-CoV-2 target nucleic acids are NOT DETECTED.  The SARS-CoV-2 RNA is generally detectable in upper and lower respiratory specimens during the acute phase of infection. The lowest concentration of SARS-CoV-2 viral copies this assay can detect is 250 copies / mL. A negative result does not preclude SARS-CoV-2 infection and should not be used as the sole basis for treatment or other patient management decisions.  A negative result may occur with improper specimen collection / handling, submission of specimen other than nasopharyngeal swab, presence of viral mutation(s) within the areas targeted by this assay, and inadequate number of viral copies (<250 copies / mL). A negative result must be combined with clinical observations, patient history, and epidemiological information.  Fact Sheet for Patients:   RoadLapTop.co.za  Fact Sheet for Healthcare Providers: http://kim-miller.com/  This test is not yet approved or  cleared by the [place] FDA and has been authorized for detection and/or diagnosis of SARS-CoV-2 by FDA under an Emergency Use  Authorization (EUA).  This EUA will remain in effect (meaning this test can be used) for the duration of the COVID-19 declaration under Section 564(b)(1) of the Act, 21 U.S.C. section 360bbb-3(b)(1), unless the authorization is terminated or revoked sooner.  Performed at [facility], [address], [place], [place] [address]   Surgical PCR screen     Status: None   Collection Time: 09/07/22 10:40 AM   Specimen: Nasal Mucosa; Nasal Swab  Result Value Ref Range Status   MRSA, PCR NEGATIVE NEGATIVE Final   Staphylococcus aureus NEGATIVE NEGATIVE Final    Comment: (NOTE) The Xpert SA Assay (FDA approved for NASAL specimens in patients 90 years of age and older), is one component of a comprehensive surveillance program. It is  not intended to diagnose infection nor to guide or monitor treatment. Performed at [facility], [address], [place], [place] [address]     Procedures/Studies: DG Chest 2 View  Result Date: 09/08/2022 CLINICAL DATA:  Cardiac device placement. EXAM: CHEST - 2 VIEW COMPARISON:  09/05/2022. FINDINGS: Left anterior chest wall dual lead pacemaker, leads projecting within the right atrium and right ventricle. Stable right anterior chest wall Port-A-Cath, tip in the lower superior vena cava. Cardiac silhouette mildly enlarged. No mediastinal or hilar masses. No evidence of adenopathy. Clear lungs.  No pleural effusion.  No pneumothorax. Skeletal structures are grossly intact. IMPRESSION: 1. Well-positioned left anterior chest wall dual lead pacemaker. 2. No acute cardiopulmonary disease. Electronically Signed   By: Amie Portland M.D.   On: 09/08/2022 10:03   EP PPM/ICD IMPLANT  Result Date: 09/07/2022 SURGEON:  Loman Brooklyn, MD   PREPROCEDURE DIAGNOSIS:  complete AV block   POSTPROCEDURE DIAGNOSIS:  complete AV block    PROCEDURES:  1. Pacemaker implantation.   INTRODUCTION:  HARLOW MCDERMOTT is a 72 y.o. male with a history of bradycardia who presents today for  pacemaker implantation.  The patient reports intermittent episodes of dizziness over the past few months.  No reversible causes have been identified.  The patient therefore presents today for pacemaker implantation.   DESCRIPTION OF PROCEDURE:  Informed written consent was obtained, and  the patient was brought to the electrophysiology lab in a fasting state.  The patient required no sedation for the procedure today.  The patients left chest was prepped and draped in the usual sterile fashion by the EP lab staff. The skin overlying the left deltopectoral region was infiltrated with lidocaine for local analgesia.  A 4-cm incision was made over the left deltopectoral region.  A left subcutaneous pacemaker pocket was fashioned using a combination of sharp and blunt dissection. Electrocautery was required to assure hemostasis.  RA/RV Lead Placement: The left axillary vein was therefore cannulated.  Through the left axillary vein, a Medtronic CapSureFix [id number] [id number]  (serial number  [id number]) right atrial lead and an Medtronic SelectSure [id number] (serial number  [id number]) right ventricular lead were advanced with fluoroscopic visualization into the right atrial appendage and left bundle positions respectively.  Initial atrial lead fib waves measured 2.5 mV with impedance of 728 ohms. Right ventricular lead R-waves measured 7.3 mV with an impedance of 1083 ohms and a threshold of 0.7 V at 0.5 msec.  Both leads were secured to the pectoralis fascia using #2-0 silk over the suture sleeves. Device Placement:  The leads were then connected to an Medtronic Azure XT DR [id number]   (serial number  [id number] ) pacemaker.  The pocket was irrigated with copious gentamicin solution.  The pacemaker was then placed into the pocket.  The pocket was then closed in 3 layers with 2.0 Vicryl suture for the 3.0 Vicryl suture subcutaneous and subcuticular layers.  Steri-  Strips and a sterile dressing were then applied. EBL<4ml.  There were no  early apparent complications.   CONCLUSIONS:  1. Successful implantation of a Medtronic Azure XT DR [id number]  dual-chamber pacemaker for symptomatic bradycardia  2. No early apparent complications.       Will Elberta Fortis, MD 09/07/2022 6:18 PM  ECHOCARDIOGRAM COMPLETE  Result Date: 09/06/2022    ECHOCARDIOGRAM REPORT   Patient Name:   Greydon FRITZ LICEA Date of Exam: 09/06/2022 Medical Rec #:  [id number]      Height:  72.0 in Accession #:    [id number]     Weight:       291.2 lb Date of Birth:  Feb 20, 1951     BSA:          2.499 m Patient Age:    71 years       BP:           144/66 mmHg Patient Gender: M              HR:           31 bpm. Exam Location:  Inpatient Procedure: 2D Echo, Cardiac Doppler, Color Doppler and Intracardiac            Opacification Agent Indications:    Dyspnea  History:        Patient has prior history of Echocardiogram examinations, most                 recent 01/09/2021. CHF, Arrythmias:Atrial Fibrillation and                 Bradycardia; Signs/Symptoms:Dyspnea.  Sonographer:    Raeford Razor Referring Phys: [id number] Onnie Boer  Sonographer Comments: No subcostal window. Image acquisition challenging due to patient body habitus. IMPRESSIONS  1. Left ventricular ejection fraction, by estimation, is 60 to 65%. The left ventricle has normal function. The left ventricle has no regional wall motion abnormalities. There is mild left ventricular hypertrophy. Left ventricular diastolic function could not be evaluated.  2. Right ventricular systolic function is normal. The right ventricular size is mildly enlarged.  3. Left atrial size was mildly dilated.  4. The mitral valve is abnormal. Trivial mitral valve regurgitation. Moderate mitral annular calcification.  5. The aortic valve is tricuspid. There is moderate calcification of the aortic valve. Aortic valve regurgitation is trivial. Mild aortic valve stenosis. Aortic valve mean gradient measures 26.0 mmHg. Aortic valve Vmax measures 3.15 m/s.  6.  Aortic dilatation noted. There is mild dilatation of the ascending aorta, measuring 41 mm.  7. Rhythm strip during this exam demonstrates afib with slow ventricular response - HR 30. Comparison(s): No significant change from prior study. 01/09/2021: LVEF 55-60%, dilated aorta to 40 mm. FINDINGS  Left Ventricle: Left ventricular ejection fraction, by estimation, is 60 to 65%. The left ventricle has normal function. The left ventricle has no regional wall motion abnormalities. Definity contrast agent was given IV to delineate the left ventricular  endocardial borders. The left ventricular internal cavity size was normal in size. There is mild left ventricular hypertrophy. Left ventricular diastolic function could not be evaluated due to atrial fibrillation. Left ventricular diastolic function could not be evaluated. Right Ventricle: The right ventricular size is mildly enlarged. No increase in right ventricular wall thickness. Right ventricular systolic function is normal. Left Atrium: Left atrial size was mildly dilated. Right Atrium: Right atrial size was normal in size. Pericardium: There is no evidence of pericardial effusion. Mitral Valve: The mitral valve is abnormal. Moderate mitral annular calcification. Trivial mitral valve regurgitation. Tricuspid Valve: The tricuspid valve is grossly normal. Tricuspid valve regurgitation is mild. Aortic Valve: The aortic valve is tricuspid. There is moderate calcification of the aortic valve. Aortic valve regurgitation is trivial. Mild aortic stenosis is present. Aortic valve mean gradient measures 26.0 mmHg. Aortic valve peak gradient measures 39.7 mmHg. Aortic valve area, by VTI measures 2.15 cm. Pulmonic Valve: The pulmonic valve was grossly normal. Pulmonic valve regurgitation is trivial. Aorta: Aortic dilatation noted. There is mild dilatation of  the ascending aorta, measuring 41 mm. IAS/Shunts: The interatrial septum was not well visualized. EKG: Rhythm strip during  this exam demonstrates afib with slow ventricular response - HR 30.  LEFT VENTRICLE PLAX 2D LVIDd:         4.80 cm LVIDs:         4.00 cm LV PW:         1.20 cm LV IVS:        1.20 cm LVOT diam:     2.40 cm LV SV:         151 LV SV Index:   60 LVOT Area:     4.52 cm  RIGHT VENTRICLE TAPSE (M-mode): 2.2 cm LEFT ATRIUM         Index LA diam:    4.60 cm 1.84 cm/m  AORTIC VALVE AV Area (Vmax):    1.84 cm AV Area (Vmean):   1.62 cm AV Area (VTI):     2.15 cm AV Vmax:           315.00 cm/s AV Vmean:          246.000 cm/s AV VTI:            0.701 m AV Peak Grad:      39.7 mmHg AV Mean Grad:      26.0 mmHg LVOT Vmax:         128.10 cm/s LVOT Vmean:        88.200 cm/s LVOT VTI:          0.333 m LVOT/AV VTI ratio: 0.48 AR Vena Contracta: 0.30 cm  AORTA Ao Root diam: 4.10 cm TRICUSPID VALVE TR Peak grad:   26.6 mmHg TR Vmax:        258.00 cm/s  SHUNTS Systemic VTI:  0.33 m Systemic Diam: 2.40 cm Zoila Shutter MD Electronically signed by Zoila Shutter MD Signature Date/Time: 09/06/2022/11:28:11 AM    Final    DG Chest 2 View  Result Date: 09/05/2022 CLINICAL DATA:  Shortness of breath EXAM: CHEST - 2 VIEW COMPARISON:  X-ray 08/26/2022 FINDINGS: Enlarged cardiopericardial silhouette. Calcified aorta. Vascular congestion. No pneumothorax, effusion or edema. Right IJ chest port with tip along the central SVC above the right atrium. Film is under penetrated degenerative changes of the spine. Surgical clips in the right upper abdomen IMPRESSION: Right IJ chest port. Enlarged cardiopericardial silhouette with a vascular congestion. Electronically Signed   By: Karen Kays M.D.   On: 09/05/2022 12:46   DG Chest Port 1 View  Result Date: 08/26/2022 CLINICAL DATA:  Shortness of breath and wheezing.  Productive cough. EXAM: PORTABLE CHEST 1 VIEW COMPARISON:  07/13/2021 FINDINGS: Right-sided power port remains in appropriate position. Mild cardiomegaly stable. Both lungs are clear. IMPRESSION: Mild cardiomegaly. No active lung  disease. Electronically Signed   By: Danae Orleans M.D.   On: 08/26/2022 08:05    Labs: BNP (last 3 results) Recent Labs    08/26/22 0754 09/05/22 1219  BNP 188.0* 394.0*   Basic Metabolic Panel: Recent Labs  Lab 09/05/22 1219 09/06/22 0150 09/07/22 0148 09/08/22 0138  NA 136 136 137 137  K 4.4 4.2 4.6 3.4*  CL 101 99 96* 96*  CO2 27 30 31 31   GLUCOSE 127* 119* 126* 96  BUN 17 18 24* 21  CREATININE 0.85 1.22 1.22 0.92  CALCIUM 9.2 9.3 9.5 8.8*  CBC: Recent Labs  Lab 09/05/22 1219 09/06/22 0150  WBC 9.2 10.4  NEUTROABS 7.3  --   HGB 14.9  14.4  HCT 46.1 44.9  MCV 95.2 94.9  PLT 164 156   Recent Labs  Lab 09/05/22 1245  GLUCAP 130*   Recent Labs    09/05/22 1422  TSH 1.541      Component Value Date/Time   COLORURINE STRAW (A) 09/05/2022 1545   APPEARANCEUR CLEAR 09/05/2022 1545   APPEARANCEUR Clear 07/12/2022 1454   LABSPEC 1.005 09/05/2022 1545   PHURINE 5.0 09/05/2022 1545   GLUCOSEU NEGATIVE 09/05/2022 1545   HGBUR NEGATIVE 09/05/2022 1545   BILIRUBINUR NEGATIVE 09/05/2022 1545   BILIRUBINUR Negative 07/12/2022 1454   KETONESUR NEGATIVE 09/05/2022 1545   PROTEINUR NEGATIVE 09/05/2022 1545   UROBILINOGEN 1.0 05/15/2010 2059   NITRITE NEGATIVE 09/05/2022 1545   LEUKOCYTESUR NEGATIVE 09/05/2022 1545   Sepsis Labs Recent Labs  Lab 09/05/22 1219 09/06/22 0150  WBC 9.2 10.4   Microbiology Recent Results (from the past 240 hour(s))  SARS Coronavirus 2 by RT PCR (hospital order, performed in [facility] hospital lab) *cepheid single result test* Anterior Nasal Swab     Status: None   Collection Time: 09/05/22 12:32 PM   Specimen: Anterior Nasal Swab  Result Value Ref Range Status   SARS Coronavirus 2 by RT PCR NEGATIVE NEGATIVE Final    Comment: (NOTE) SARS-CoV-2 target nucleic acids are NOT DETECTED.  The SARS-CoV-2 RNA is generally detectable in upper and lower respiratory specimens during the acute phase of infection. The  lowest concentration of SARS-CoV-2 viral copies this assay can detect is 250 copies / mL. A negative result does not preclude SARS-CoV-2 infection and should not be used as the sole basis for treatment or other patient management decisions.  A negative result may occur with improper specimen collection / handling, submission of specimen other than nasopharyngeal swab, presence of viral mutation(s) within the areas targeted by this assay, and inadequate number of viral copies (<250 copies / mL). A negative result must be combined with clinical observations, patient history, and epidemiological information.  Fact Sheet for Patients:   RoadLapTop.co.za  Fact Sheet for Healthcare Providers: http://kim-miller.com/  This test is not yet approved or  cleared by the [place] FDA and has been authorized for detection and/or diagnosis of SARS-CoV-2 by FDA under an Emergency Use Authorization (EUA).  This EUA will remain in effect (meaning this test can be used) for the duration of the COVID-19 declaration under Section 564(b)(1) of the Act, 21 U.S.C. section 360bbb-3(b)(1), unless the authorization is terminated or revoked sooner.  Performed at [facility], [address], [place], [place] [address]   Surgical PCR screen     Status: None   Collection Time: 09/07/22 10:40 AM   Specimen: Nasal Mucosa; Nasal Swab  Result Value Ref Range Status   MRSA, PCR NEGATIVE NEGATIVE Final   Staphylococcus aureus NEGATIVE NEGATIVE Final    Comment: (NOTE) The Xpert SA Assay (FDA approved for NASAL specimens in patients 67 years of age and older), is one component of a comprehensive surveillance program. It is not intended to diagnose infection nor to guide or monitor treatment. Performed at [facility], [address] [place], [place] [address]      Time coordinating discharge: 25 minutes  SIGNED: Lanae Boast, MD  [facility] 09/08/2022, 1:44 PM  If 7PM-7AM, please contact night-coverage [email or web address]

## 2022-09-10 ENCOUNTER — Encounter (HOSPITAL_COMMUNITY): Payer: Self-pay | Admitting: Cardiology

## 2022-09-10 MED FILL — Gentamicin Sulfate Inj 40 MG/ML: INTRAMUSCULAR | Qty: 80 | Status: AC

## 2022-09-17 ENCOUNTER — Other Ambulatory Visit: Payer: Self-pay | Admitting: "Endocrinology

## 2022-09-17 DIAGNOSIS — E2740 Unspecified adrenocortical insufficiency: Secondary | ICD-10-CM

## 2022-09-20 ENCOUNTER — Ambulatory Visit: Payer: 59 | Attending: Interventional Cardiology

## 2022-09-20 DIAGNOSIS — I442 Atrioventricular block, complete: Secondary | ICD-10-CM

## 2022-09-20 LAB — CUP PACEART INCLINIC DEVICE CHECK
Battery Remaining Longevity: 152 mo
Battery Voltage: 3.21 V
Brady Statistic RA Percent Paced: 0 %
Brady Statistic RV Percent Paced: 97.84 %
Date Time Interrogation Session: 20240516165525
Implantable Lead Connection Status: 753985
Implantable Lead Connection Status: 753985
Implantable Lead Implant Date: 20240503
Implantable Lead Implant Date: 20240503
Implantable Lead Location: 753859
Implantable Lead Location: 753860
Implantable Lead Model: 3830
Implantable Lead Model: 5076
Implantable Pulse Generator Implant Date: 20240503
Lead Channel Impedance Value: 418 Ohm
Lead Channel Impedance Value: 475 Ohm
Lead Channel Impedance Value: 646 Ohm
Lead Channel Impedance Value: 646 Ohm
Lead Channel Pacing Threshold Amplitude: 0.25 V
Lead Channel Pacing Threshold Amplitude: 0.625 V
Lead Channel Pacing Threshold Pulse Width: 0.4 ms
Lead Channel Pacing Threshold Pulse Width: 0.4 ms
Lead Channel Sensing Intrinsic Amplitude: 2 mV
Lead Channel Sensing Intrinsic Amplitude: 2.5 mV
Lead Channel Setting Pacing Amplitude: 3.5 V
Lead Channel Setting Pacing Amplitude: 3.5 V
Lead Channel Setting Pacing Pulse Width: 0.4 ms
Lead Channel Setting Sensing Sensitivity: 2.8 mV
Zone Setting Status: 755011
Zone Setting Status: 755011

## 2022-09-20 NOTE — Patient Instructions (Signed)
   After Your Pacemaker   Monitor your pacemaker site for redness, swelling, and drainage. Call the device clinic at [phone] if you experience these symptoms or fever/chills.  Your incision was closed with Steri-strips or staples:  You may shower 7 days after your procedure and wash your incision with soap and water. Avoid lotions, ointments, or perfumes over your incision until it is well-healed.  You may use a hot tub or a pool after your wound check appointment if the incision is completely closed.  Do not lift, push or pull greater than 10 pounds with the affected arm until 6 weeks after your procedure. UNTIL AFTER MAY 14TH.  There are no other restrictions in arm movement after your wound check appointment.  You may drive, unless driving has been restricted by your healthcare providers.  Remote monitoring is used to monitor your pacemaker from home. This monitoring is scheduled every 91 days by our office. It allows [place] to keep an eye on the functioning of your device to ensure it is working properly. You will routinely see your Electrophysiologist annually (more often if necessary).

## 2022-09-20 NOTE — Progress Notes (Signed)
Wound check appointment. Steri-strips removed. Wound without redness or edema. Incision edges approximated, wound well healed. Normal device function. Thresholds, sensing, and impedances consistent with implant measurements. Device programmed at 3.5V/auto capture programmed on for extra safety margin until 3 month visit. Histogram distribution appropriate for patient and level of activity. AF BURDEN 100%, controlled ventricular rates. Patient educated about wound care, arm mobility, lifting restrictions. ROV in 3 months with implanting physician.

## 2022-09-24 ENCOUNTER — Other Ambulatory Visit: Payer: Self-pay | Admitting: "Endocrinology

## 2022-10-05 LAB — COMPREHENSIVE METABOLIC PANEL
ALT: 29 IU/L (ref 0–44)
AST: 25 IU/L (ref 0–40)
Albumin/Globulin Ratio: 2.1 (ref 1.2–2.2)
Albumin: 4.1 g/dL (ref 3.8–4.8)
Alkaline Phosphatase: 88 IU/L (ref 44–121)
BUN/Creatinine Ratio: 17 (ref 10–24)
BUN: 11 mg/dL (ref 8–27)
Bilirubin Total: 0.9 mg/dL (ref 0.0–1.2)
CO2: 31 mmol/L — ABNORMAL HIGH (ref 20–29)
Calcium: 9.3 mg/dL (ref 8.6–10.2)
Chloride: 100 mmol/L (ref 96–106)
Creatinine, Ser: 0.64 mg/dL — ABNORMAL LOW (ref 0.76–1.27)
Globulin, Total: 2 g/dL (ref 1.5–4.5)
Glucose: 134 mg/dL — ABNORMAL HIGH (ref 70–99)
Potassium: 3.7 mmol/L (ref 3.5–5.2)
Sodium: 143 mmol/L (ref 134–144)
Total Protein: 6.1 g/dL (ref 6.0–8.5)
eGFR: 101 mL/min/{1.73_m2} (ref >59)

## 2022-10-05 LAB — TSH: TSH: 1.77 u[IU]/mL (ref 0.450–4.500)

## 2022-10-05 LAB — T4, FREE: Free T4: 1.1 ng/dL (ref 0.82–1.77)

## 2022-10-11 ENCOUNTER — Ambulatory Visit (INDEPENDENT_AMBULATORY_CARE_PROVIDER_SITE_OTHER): Payer: 59 | Admitting: "Endocrinology

## 2022-10-11 ENCOUNTER — Encounter: Payer: Self-pay | Admitting: "Endocrinology

## 2022-10-11 VITALS — BP 144/88 | HR 76 | Ht 72.0 in | Wt 290.4 lb

## 2022-10-11 DIAGNOSIS — E039 Hypothyroidism, unspecified: Secondary | ICD-10-CM

## 2022-10-11 DIAGNOSIS — E2740 Unspecified adrenocortical insufficiency: Secondary | ICD-10-CM

## 2022-10-11 DIAGNOSIS — E782 Mixed hyperlipidemia: Secondary | ICD-10-CM

## 2022-10-11 LAB — POCT GLYCOSYLATED HEMOGLOBIN (HGB A1C): HbA1c, POC (controlled diabetic range): 5.6 % (ref 0.0–7.0)

## 2022-10-11 NOTE — Progress Notes (Signed)
10/11/2022, 10:52 AM   Subjective:    Patient ID: Roy [name], male    DOB: 1950/09/07, PCP Rebecka Apley, NP   Past Medical History:  Diagnosis Date   Atrial fibrillation (HCC)    Cancer (HCC)    Diffuse Large B-Cell lymphoma   GERD (gastroesophageal reflux disease)    High cholesterol    Hypertension    Obesity    Past Surgical History:  Procedure Laterality Date   APPENDECTOMY     CATARACT EXTRACTION Bilateral 2022   CHOLECYSTECTOMY     ESOPHAGEAL DILATION     multiple times   IR IMAGING GUIDED PORT INSERTION  03/31/2021   JOINT REPLACEMENT Left    hip   PACEMAKER IMPLANT N/A 09/07/2022   Procedure: PACEMAKER IMPLANT;  Surgeon: Regan Lemming, MD;  Location: [facility];  Service: Cardiovascular;  Laterality: N/A;   Social History   Socioeconomic History   Marital status: Widowed    Spouse name: Not on file   Number of children: 3   Years of education: Not on file   Highest education level: Not on file  Occupational History   Occupation: Retired  Tobacco Use   Smoking status: Never    Passive exposure: Never   Smokeless tobacco: Never  Vaping Use   Vaping Use: Never used  Substance and Sexual Activity   Alcohol use: No   Drug use: No   Sexual activity: Not Currently  Other Topics Concern   Not on file  Social History Narrative   Not on file   Social Determinants of Health   Financial Resource Strain: Low Risk  (11/08/2020)   Overall Financial Resource Strain (CARDIA)    Difficulty of Paying Living Expenses: Not hard at all  Food Insecurity: No Food Insecurity (11/08/2020)   Hunger Vital Sign    Worried About Running Out of Food in the Last Year: Never true    Ran Out of Food in the Last Year: Never true  Transportation Needs: No Transportation Needs (11/08/2020)   PRAPARE - Administrator, Civil Service (Medical): No    Lack of Transportation (Non-Medical): No  Physical  Activity: Sufficiently Active (11/08/2020)   Exercise Vital Sign    Days of Exercise per Week: 5 days    Minutes of Exercise per Session: 30 min  Stress: No Stress Concern Present (11/08/2020)   [employer] of Occupational Health - Occupational Stress Questionnaire    Feeling of Stress : Not at all  Social Connections: Moderately Isolated (11/08/2020)   Social Connection and Isolation Panel [NHANES]    Frequency of Communication with Friends and Family: More than three times a week    Frequency of Social Gatherings with Friends and Family: Three times a week    Attends Religious Services: 1 to 4 times per year    Active Member of Clubs or Organizations: No    Attends Banker Meetings: Never    Marital Status: Widowed   Family History  Problem Relation Age of Onset   Heart failure Father    Heart attack Father        Deceased    Thyroid disease Sister    Outpatient Encounter Medications as  of 10/11/2022  Medication Sig   acetaminophen (TYLENOL) 325 MG tablet Take 2 tablets (650 mg total) by mouth every 6 (six) hours as needed for headache.   alfuzosin (UROXATRAL) 10 MG 24 hr tablet TAKE ONE TABLET DAILY WITH BREAKFAST   ELIQUIS 5 MG TABS tablet Take 1 tablet (5 mg total) by mouth 2 (two) times daily. Resume from may 9   EQUATE STOOL SOFTENER 100 MG capsule Take 2 capsules by mouth twice daily   FEROSUL 325 (65 Fe) MG tablet Take 1 tablet by mouth once daily with breakfast (Patient not taking: Reported on 09/05/2022)   levocetirizine (XYZAL) 5 MG tablet Take 5 mg by mouth every evening.   levothyroxine (SYNTHROID) 100 MCG tablet TAKE ONE TABLET ONCE DAILY BEFORE BREAKFAST   magnesium oxide (MAG-OX) 400 (240 Mg) MG tablet Take 400 mg by mouth daily.   omeprazole (PRILOSEC) 10 MG capsule Take 20 mg by mouth daily.   potassium chloride (KLOR-CON) 10 MEQ tablet TAKE TWO TABLETS TWICE DAILY   predniSONE (DELTASONE) 10 MG tablet TAKE ONE TABLET ONCE DAILY WITH BREAKFAST    pregabalin (LYRICA) 150 MG capsule Take 150 mg by mouth 2 (two) times daily.   rosuvastatin (CRESTOR) 5 MG tablet Take 1 tablet by mouth at bedtime.   valACYclovir (VALTREX) 1000 MG tablet Take 1,000 mg by mouth daily.   vitamin B-12 (CYANOCOBALAMIN) 1000 MCG tablet Take 1 tablet (1,000 mcg total) by mouth daily.   [DISCONTINUED] fludrocortisone (FLORINEF) 0.1 MG tablet TAKE ONE TABLET ONCE DAILY WITH BREAKFAST   No facility-administered encounter medications on file as of 10/11/2022.   ALLERGIES: Allergies  Allergen Reactions   Adhesive [Tape]     Contact dermatitis   Sulfa Antibiotics     UNKNOWN Childhood reaction    VACCINATION STATUS: Immunization History  Administered Date(s) Administered   Influenza,inj,Quad PF,6+ Mos 04/28/2015   Moderna Sars-Covid-2 Vaccination 07/03/2019, 07/31/2019   PFIZER(Purple Top)SARS-COV-2 Vaccination 03/25/2020   Pfizer Covid-19 Vaccine Bivalent Booster 19yrs & up 08/24/2021   Pneumococcal Polysaccharide-23 04/28/2015    HPI Roy [name] is 72 y.o. male who presents today with a medical history as above. he is being seen in follow-up after he was seen in consultation for adrenal insufficiency requested by Rebecka Apley, NP.  He also has hypothyroidism on levothyroxine.  His previsit thyroid function tests are consistent with appropriate replacement.  His previous ACTH stimulation test confirmed primary adrenal insufficiency.    He is currently on prednisone 10 mg p.o. daily, fludrocortisone 0.1 mg p.o. daily at breakfast.  Since last visit, he came off of midodrine.  More recently, he has seen his blood pressure rising and he was taken off of fludrocortisone in the interval.  He did receive cardiac pacemaker recently.      He denies any history of head injury, visual field deficits, no headaches.  He denies any prior history of abdominal surgery, injury, nor infections.   He returns with steady weight after he gained 20 pounds prior to his  last visit.     He feels stronger and better, denies any swelling.  His other medical problems include hyperlipidemia on treatment with Crestor.  He has paroxysmal atrial fibrillation, left anterior fascicular block, currently on anticoagulation using Eliquis 5 mg twice a day.      Review of Systems  Constitutional: + Recent weight gain, + fatigue, no subjective hyperthermia, no subjective hypothermia  Objective:       10/11/2022   10:02 AM 10/11/2022  9:47 AM 09/08/2022    4:21 PM  Vitals with BMI  Height  6\' 0"    Weight  290 lbs 6 oz   BMI  39.38   Systolic 144 154 161  Diastolic 88 96 82  Pulse  76 60    BP (!) 144/88 Comment: L arm with manuel cuff  Pulse 76   Ht 6' (1.829 m)   Wt 290 lb 6.4 oz (131.7 kg)   BMI 39.39 kg/m   Wt Readings from Last 3 Encounters:  10/11/22 290 lb 6.4 oz (131.7 kg)  09/07/22 291 lb 11.2 oz (132.3 kg)  08/26/22 270 lb (122.5 kg)    Physical Exam  Constitutional:  Body mass index is 39.39 kg/m.,  not in acute distress, normal state of mind, +  walks with a cane   CMP ( most recent) CMP     Component Value Date/Time   NA 143 10/04/2022 1308   K 3.7 10/04/2022 1308   CL 100 10/04/2022 1308   CO2 31 (H) 10/04/2022 1308   GLUCOSE 134 (H) 10/04/2022 1308   GLUCOSE 96 09/08/2022 0138   BUN 11 10/04/2022 1308   CREATININE 0.64 (L) 10/04/2022 1308   CREATININE 0.82 11/11/2020 1046   CALCIUM 9.3 10/04/2022 1308   PROT 6.1 10/04/2022 1308   ALBUMIN 4.1 10/04/2022 1308   AST 25 10/04/2022 1308   AST 74 (H) 11/11/2020 1046   ALT 29 10/04/2022 1308   ALT 21 11/11/2020 1046   ALKPHOS 88 10/04/2022 1308   BILITOT 0.9 10/04/2022 1308   BILITOT 2.3 (H) 11/11/2020 1046   GFRNONAA >60 09/08/2022 0138   GFRNONAA >60 11/11/2020 1046   GFRAA >60 04/29/2019 1033     Diabetic Labs (most recent): Lab Results  Component Value Date   HGBA1C 5.6 10/11/2022   HGBA1C 5.1 01/08/2021     Lipid Panel ( most recent) Lipid Panel     Component  Value Date/Time   CHOL 147 04/29/2015 0426   TRIG 153 (H) 04/29/2015 0426   HDL 19 (L) 04/29/2015 0426   CHOLHDL 7.7 04/29/2015 0426   VLDL 31 04/29/2015 0426   LDLCALC 97 04/29/2015 0426      Lab Results  Component Value Date   TSH 1.770 10/04/2022   TSH 1.541 09/05/2022   TSH 1.863 02/20/2022   TSH 10.200 (H) 10/03/2021   TSH 8.100 (H) 05/30/2021   TSH 0.770 02/23/2021   TSH 0.791 02/21/2021   TSH 0.036 (L) 01/08/2021   TSH 0.067 (L) 11/09/2020   TSH 0.030 (L) 10/21/2020   FREET4 1.10 10/04/2022   FREET4 0.88 02/20/2022   FREET4 1.13 10/03/2021   FREET4 0.75 (L) 05/30/2021   FREET4 0.31 (L) 02/21/2021   FREET4 0.74 01/09/2021   FREET4 1.12 11/09/2020   FREET4 1.75 (H) 10/21/2020   FREET4 0.98 03/17/2018   FREET4 0.93 04/28/2015       Assessment & Plan:   1. Adrenal insufficiency (HCC) 2.  Hypothyroidism 3.  Hyperlipidemia  I discussed with him the need for steroids replacement.  He stands to benefit from steroid replacement.  He is advised to continue prednisone 10 mg p.o. daily at breakfast for glucocorticoid replacement.  He is advised to stay off of fludrocortisone for now.    His repeat blood pressure was 144/88, patient reports near target blood pressure measurements at home.  We discussed about sick day rules on prednisone.  -I have emphasized the absolute need for steroid replacement on a daily basis for long-term.  Regarding his hypothyroidism: His previsit labs are consistent with appropriate replacement.  He is advised to continue levothyroxine 100 mcg p.o. daily before breakfast.   - We discussed about the correct intake of his thyroid hormone, on empty stomach at fasting, with water, separated by at least 30 minutes from breakfast and other medications,  and separated by more than 4 hours from calcium, iron, multivitamins, acid reflux medications (PPIs). -Patient is made aware of the fact that thyroid hormone replacement is needed for life, dose to be  adjusted by periodic monitoring of thyroid function tests.   His previsit labs showed normal CMP with sodium 143, potassium 3.7.    -He is advised on fall precaution, encouraged to use his cane for equilibrium.    Regarding his hyperlipidemia: His recent LDL is 97.  He is advised to continue Crestor 5 mg p.o. nightly.  His point-of-care A1c is 5.6% indicating absence of prediabetes/diabetes.   - he is advised to maintain close follow up with Hemberg, Ruby Cola, NP for primary care needs.    I spent  25  minutes in the care of the patient today including review of labs from Thyroid Function, CMP, and other relevant labs ; imaging/biopsy records (current and previous including abstractions from other facilities); face-to-face time discussing  his lab results and symptoms, medications doses, his options of short and long term treatment based on the latest standards of care / guidelines;   and documenting the encounter.  Roy [name]  participated in the discussions, expressed understanding, and voiced agreement with the above plans.  All questions were answered to his satisfaction. he is encouraged to contact clinic should he have any questions or concerns prior to his return visit.   Follow up plan: Return in about 6 months (around 04/12/2023) for Fasting Labs  in AM B4 8.   Marquis Lunch, MD [facility] [address] [place], [place] [address] Phone: [phone]  Fax: [phone]     10/11/2022, 10:52 AM  This note was partially dictated with voice recognition software. Similar sounding words can be transcribed inadequately or may not  be corrected upon review.  Dexamethasone

## 2022-11-02 ENCOUNTER — Ambulatory Visit: Payer: 59 | Admitting: Urology

## 2022-11-05 DIAGNOSIS — N3941 Urge incontinence: Secondary | ICD-10-CM | POA: Insufficient documentation

## 2022-11-05 NOTE — Progress Notes (Deleted)
Roy [name] 01-May-1951 [id number]  History of Present Illness: Roy [name] is a 72 y.o. male who presents today for return visit at [facility] [place]. - GU history: 1. BPH with BOO and LUTS (frequency, nocturia, urgency, dribbling). - ***Taking Alfuzosin. ***refills almost due - Previously took Flomax but wasn't helpful. - Was scheduled for cystoscopy at appointments on 11/30/2021 and 02/01/2022; per notes it was not performed / not necessary at that time. - Last known PSA was normal (0.4) on 05/02/2022. 2. Urge incontinence. - Previously took [place] but had incomplete bladder emptying.  - Previously took Trospium but had blurry vision and dizziness.  At last visit with Dr. Annabell Howells on 07/12/2022: Seen for follow up after UTI with urine culture on 07/05/2022 positive for E. Coli and Klebsiella oxytoca. Doing better after antibiotic switched to Vantin. The plan was follow up in 6 weeks.  Since last visit: No ***additional urine culture results found per chart review.  Today: He reports ***  He reports *** urinary stream. He {Actions; denies-reports:120008} urinary hesitancy, urgency, frequency, dysuria, gross hematuria, straining to void, or sensations of incomplete emptying.  He {Actions; denies-reports:120008} urge incontinence with leakage ***x/day; wears *** ***pads/ ***diapers per day.   Fall Screening: Do you usually have a device to assist in your mobility? {yes/no:20286} ***cane / ***walker / ***wheelchair   Medications: Current Outpatient Medications  Medication Sig Dispense Refill   acetaminophen (TYLENOL) 325 MG tablet Take 2 tablets (650 mg total) by mouth every 6 (six) hours as needed for headache. 30 tablet 1   alfuzosin (UROXATRAL) 10 MG 24 hr tablet TAKE ONE TABLET DAILY WITH BREAKFAST 90 tablet 3   ELIQUIS 5 MG TABS tablet Take 1 tablet (5 mg total) by mouth 2 (two) times daily. Resume from may 9 60 tablet    EQUATE STOOL SOFTENER 100 MG capsule  Take 2 capsules by mouth twice daily 120 capsule 0   FEROSUL 325 (65 Fe) MG tablet Take 1 tablet by mouth once daily with breakfast (Patient not taking: Reported on 09/05/2022) 30 tablet 0   levocetirizine (XYZAL) 5 MG tablet Take 5 mg by mouth every evening.     levothyroxine (SYNTHROID) 100 MCG tablet TAKE ONE TABLET ONCE DAILY BEFORE BREAKFAST 90 tablet 1   magnesium oxide (MAG-OX) 400 (240 Mg) MG tablet Take 400 mg by mouth daily.     omeprazole (PRILOSEC) 10 MG capsule Take 20 mg by mouth daily.     potassium chloride (KLOR-CON) 10 MEQ tablet TAKE TWO TABLETS TWICE DAILY 360 tablet 0   predniSONE (DELTASONE) 10 MG tablet TAKE ONE TABLET ONCE DAILY WITH BREAKFAST 90 tablet 0   pregabalin (LYRICA) 150 MG capsule Take 150 mg by mouth 2 (two) times daily.     rosuvastatin (CRESTOR) 5 MG tablet Take 1 tablet by mouth at bedtime.     valACYclovir (VALTREX) 1000 MG tablet Take 1,000 mg by mouth daily.     vitamin B-12 (CYANOCOBALAMIN) 1000 MCG tablet Take 1 tablet (1,000 mcg total) by mouth daily. 30 tablet 3   No current facility-administered medications for this visit.    Allergies: Allergies  Allergen Reactions   Adhesive [Tape]     Contact dermatitis   Sulfa Antibiotics     UNKNOWN Childhood reaction    Past Medical History:  Diagnosis Date   Atrial fibrillation (HCC)    Cancer (HCC)    Diffuse Large B-Cell lymphoma   GERD (gastroesophageal reflux disease)    High cholesterol  Hypertension    Obesity    Past Surgical History:  Procedure Laterality Date   APPENDECTOMY     CATARACT EXTRACTION Bilateral 2022   CHOLECYSTECTOMY     ESOPHAGEAL DILATION     multiple times   IR IMAGING GUIDED PORT INSERTION  03/31/2021   JOINT REPLACEMENT Left    hip   PACEMAKER IMPLANT N/A 09/07/2022   Procedure: PACEMAKER IMPLANT;  Surgeon: Regan Lemming, MD;  Location: [facility];  Service: Cardiovascular;  Laterality: N/A;   Family History  Problem Relation Age of Onset    Heart failure Father    Heart attack Father        Deceased    Thyroid disease Sister    Social History   Socioeconomic History   Marital status: Widowed    Spouse name: Not on file   Number of children: 3   Years of education: Not on file   Highest education level: Not on file  Occupational History   Occupation: Retired  Tobacco Use   Smoking status: Never    Passive exposure: Never   Smokeless tobacco: Never  Vaping Use   Vaping Use: Never used  Substance and Sexual Activity   Alcohol use: No   Drug use: No   Sexual activity: Not Currently  Other Topics Concern   Not on file  Social History Narrative   Not on file   Social Determinants of Health   Financial Resource Strain: Low Risk  (11/08/2020)   Overall Financial Resource Strain (CARDIA)    Difficulty of Paying Living Expenses: Not hard at all  Food Insecurity: No Food Insecurity (11/08/2020)   Hunger Vital Sign    Worried About Running Out of Food in the Last Year: Never true    Ran Out of Food in the Last Year: Never true  Transportation Needs: No Transportation Needs (11/08/2020)   PRAPARE - Administrator, Civil Service (Medical): No    Lack of Transportation (Non-Medical): No  Physical Activity: Sufficiently Active (11/08/2020)   Exercise Vital Sign    Days of Exercise per Week: 5 days    Minutes of Exercise per Session: 30 min  Stress: No Stress Concern Present (11/08/2020)   [employer] of Occupational Health - Occupational Stress Questionnaire    Feeling of Stress : Not at all  Social Connections: Moderately Isolated (11/08/2020)   Social Connection and Isolation Panel [NHANES]    Frequency of Communication with Friends and Family: More than three times a week    Frequency of Social Gatherings with Friends and Family: Three times a week    Attends Religious Services: 1 to 4 times per year    Active Member of Clubs or Organizations: No    Attends Banker Meetings: Never    Marital  Status: Widowed  Intimate Partner Violence: Not At Risk (11/08/2020)   Humiliation, Afraid, Rape, and Kick questionnaire    Fear of Current or Ex-Partner: No    Emotionally Abused: No    Physically Abused: No    Sexually Abused: No    Review of Systems Constitutional: Patient ***denies any unintentional weight loss or change in strength lntegumentary: Patient ***denies any rashes or pruritus Eyes: Patient denies ***dry eyes ENT: Patient ***denies dry mouth Cardiovascular: Patient ***denies chest pain or syncope Respiratory: Patient ***denies shortness of breath Gastrointestinal: Patient ***denies nausea, vomiting, constipation, or diarrhea Musculoskeletal: Patient ***denies muscle cramps or weakness Neurologic: Patient ***denies convulsions or seizures Psychiatric: Patient ***denies  memory problems Allergic/Immunologic: Patient ***denies recent allergic reaction(s) Hematologic/Lymphatic: Patient denies bleeding tendencies Endocrine: Patient ***denies heat/cold intolerance  GU: As per HPI.  OBJECTIVE There were no vitals filed for this visit. There is no height or weight on file to calculate BMI.  Physical Examination  Constitutional: ***No obvious distress; patient is ***non-toxic appearing  Cardiovascular: ***No visible lower extremity edema.  Respiratory: The patient does ***not have audible wheezing/stridor; respirations do ***not appear labored  Gastrointestinal: Abdomen ***non-distended Musculoskeletal: ***Normal ROM of UEs  Skin: ***No obvious rashes/open sores  Neurologic: CN 2-12 grossly ***intact Psychiatric: Answered questions ***appropriately with ***normal affect  Hematologic/Lymphatic/Immunologic: ***No obvious bruises or sites of spontaneous bleeding  UA: {Desc; negative/positive:13464} *** WBC/hpf, *** RBC/hpf, bacteria (***) *** nitrites, *** leukocytes, *** blood PVR: *** ml  ASSESSMENT No diagnosis found. ***  Will plan for follow up in ***months /  ***1 year or sooner if needed. Pt verbalized understanding and agreement. All questions were answered.  PLAN Advised the following: 1. *** 2. ***No follow-ups on file.  No orders of the defined types were placed in this encounter.   It has been explained that the patient is to follow regularly with their PCP in addition to all other providers involved in their care and to follow instructions provided by these respective offices. Patient advised to contact urology clinic if any urologic-pertaining questions, concerns, new symptoms or problems arise in the interim period.  There are no Patient Instructions on file for this visit.  Electronically signed by:  Donnita Falls, FNP   11/05/22    5:33 PM

## 2022-11-06 ENCOUNTER — Ambulatory Visit: Payer: 59 | Admitting: Urology

## 2022-11-06 DIAGNOSIS — N401 Enlarged prostate with lower urinary tract symptoms: Secondary | ICD-10-CM

## 2022-11-06 DIAGNOSIS — N3941 Urge incontinence: Secondary | ICD-10-CM

## 2022-11-10 ENCOUNTER — Encounter (HOSPITAL_COMMUNITY): Payer: Self-pay | Admitting: Emergency Medicine

## 2022-11-10 ENCOUNTER — Other Ambulatory Visit: Payer: Self-pay

## 2022-11-10 ENCOUNTER — Emergency Department (HOSPITAL_COMMUNITY)
Admission: EM | Admit: 2022-11-10 | Discharge: 2022-11-10 | Disposition: A | Discharge status: Home / Self Care | Payer: 59 | Attending: Emergency Medicine | Admitting: Emergency Medicine

## 2022-11-10 DIAGNOSIS — Z23 Encounter for immunization: Secondary | ICD-10-CM | POA: Diagnosis not present

## 2022-11-10 DIAGNOSIS — S61300A Unspecified open wound of right index finger with damage to nail, initial encounter: Secondary | ICD-10-CM | POA: Diagnosis not present

## 2022-11-10 DIAGNOSIS — W270XXA Contact with workbench tool, initial encounter: Secondary | ICD-10-CM | POA: Insufficient documentation

## 2022-11-10 DIAGNOSIS — I10 Essential (primary) hypertension: Secondary | ICD-10-CM | POA: Diagnosis not present

## 2022-11-10 DIAGNOSIS — Z7901 Long term (current) use of anticoagulants: Secondary | ICD-10-CM | POA: Insufficient documentation

## 2022-11-10 DIAGNOSIS — S6991XA Unspecified injury of right wrist, hand and finger(s), initial encounter: Secondary | ICD-10-CM | POA: Diagnosis present

## 2022-11-10 DIAGNOSIS — S61208A Unspecified open wound of other finger without damage to nail, initial encounter: Secondary | ICD-10-CM

## 2022-11-10 MED ORDER — TETANUS-DIPHTH-ACELL PERTUSSIS 5-2.5-18.5 LF-MCG/0.5 IM SUSY
0.5000 mL | PREFILLED_SYRINGE | Freq: Once | INTRAMUSCULAR | Status: AC
  Administered 2022-11-10: 0.5 mL via INTRAMUSCULAR
  Filled 2022-11-10: qty 0.5

## 2022-11-10 NOTE — Discharge Instructions (Signed)
Keep the finger clean with mild soap and water.  Do not use peroxide or alcohol.  Keep bandaged.  Replace Xeroform once daily until area begins to heal.  Follow-up with your primary care provider for recheck return to the emergency department for any new or worsening symptoms or signs of infection.

## 2022-11-10 NOTE — ED Provider Notes (Signed)
[place] EMERGENCY DEPARTMENT AT [facility] Provider Note   CSN: [id number] Arrival date & time: 11/10/22  1191     History  Chief Complaint  Patient presents with   Laceration    Roy [name] is a 72 y.o. male.   Laceration Associated symptoms: no fever        Roy [name] is a 72 y.o. male with history history of hypertension, large B-cell lymphoma, and atrial fibrillation, anticoagulated, who presents to the Emergency Department requesting evaluation of laceration to the distal tip of the right index finger.  States he was using a table saw when the saw kicked back cutting his finger.  Incident occurred around 7:30 PM last evening.  He cleaned the area and bandaged it.  He does take Eliquis daily reports some bleeding initially but controlled after using direct pressure.  Denies any numbness or significant pain to the distal finger.  No injury of the nail.  Home Medications Prior to Admission medications   Medication Sig Start Date End Date Taking? Authorizing Provider  acetaminophen (TYLENOL) 325 MG tablet Take 2 tablets (650 mg total) by mouth every 6 (six) hours as needed for headache. 01/13/21   Mariea Clonts, Courage, MD  alfuzosin (UROXATRAL) 10 MG 24 hr tablet TAKE ONE TABLET DAILY WITH BREAKFAST 02/01/22   Bjorn Pippin, MD  ELIQUIS 5 MG TABS tablet Take 1 tablet (5 mg total) by mouth 2 (two) times daily. Resume from may 9 09/13/22   Lanae Boast, MD  EQUATE STOOL SOFTENER 100 MG capsule Take 2 capsules by mouth twice daily 08/13/21   Carnella Guadalajara, PA-C  FEROSUL 325 (65 Fe) MG tablet Take 1 tablet by mouth once daily with breakfast Patient not taking: Reported on 09/05/2022 07/30/21   Carnella Guadalajara, PA-C  levocetirizine (XYZAL) 5 MG tablet Take 5 mg by mouth every evening. 08/30/22   [provider]  levothyroxine (SYNTHROID) 100 MCG tablet TAKE ONE TABLET ONCE DAILY BEFORE BREAKFAST 06/07/22   Roma Kayser, MD  magnesium oxide (MAG-OX) 400  (240 Mg) MG tablet Take 400 mg by mouth daily.    [provider]  omeprazole (PRILOSEC) 10 MG capsule Take 20 mg by mouth daily. 10/26/21   [provider]  potassium chloride (KLOR-CON) 10 MEQ tablet TAKE TWO TABLETS TWICE DAILY 09/24/22   Roma Kayser, MD  predniSONE (DELTASONE) 10 MG tablet TAKE ONE TABLET ONCE DAILY WITH BREAKFAST 09/18/22   Roma Kayser, MD  pregabalin (LYRICA) 150 MG capsule Take 150 mg by mouth 2 (two) times daily. 08/14/22   [provider]  rosuvastatin (CRESTOR) 5 MG tablet Take 1 tablet by mouth at bedtime. 08/23/20   [provider]  valACYclovir (VALTREX) 1000 MG tablet Take 1,000 mg by mouth daily.    [provider]  vitamin B-12 (CYANOCOBALAMIN) 1000 MCG tablet Take 1 tablet (1,000 mcg total) by mouth daily. 02/28/21   Carnella Guadalajara, PA-C      Allergies    Adhesive [tape] and Sulfa antibiotics    Review of Systems   Review of Systems  Constitutional:  Negative for chills and fever.  Skin:  Positive for wound (Laceration distal tip right index finger).  Neurological:  Negative for weakness and numbness.    Physical Exam Updated Vital Signs BP (!) 162/90   Pulse 81   Temp 97.7 F (36.5 C) (Oral)   Resp 18   SpO2 97%  Physical Exam Vitals and nursing note reviewed.  Constitutional:      General: He is not in acute distress.    Appearance: Normal appearance.  Cardiovascular:     Rate and Rhythm: Normal rate and regular rhythm.     Pulses: Normal pulses.  Pulmonary:     Effort: Pulmonary effort is normal.  Musculoskeletal:        General: Tenderness and signs of injury present. Normal range of motion.     Right hand: Tenderness present. No swelling, deformity or bony tenderness. Normal range of motion. Normal strength. Normal sensation. There is no disruption of two-point discrimination. Normal capillary refill. Normal pulse.     Comments: Complete avulsion of the skin to the distal  tip of the right index finger.  No nail involvement.  No deep injury or exposed bone.  Patient has full range of motion of the DIP joint.  Sensation intact.  Skin:    General: Skin is warm.     Capillary Refill: Capillary refill takes less than 2 seconds.  Neurological:     General: No focal deficit present.     Mental Status: He is alert.     Sensory: No sensory deficit.     Motor: No weakness.     ED Results / Procedures / Treatments   Labs (all labs ordered are listed, but only abnormal results are displayed) Labs Reviewed - No data to display  EKG None  Radiology No results found.  Procedures Procedures    Medications Ordered in ED Medications  Tdap (BOOSTRIX) injection 0.5 mL (has no administration in time range)    ED Course/ Medical Decision Making/ A&P                             Medical Decision Making Patient here for evaluation of skin avulsion to the distal tip of the right index finger after using a table saw.  States his Td is up-to-date although epic does not support this.  Neurovascularly intact.  Patient is anticoagulated but bleeding controlled after direct pressure.  Injury occurred little over 12 hours ago.  He cleaned the area when the incident occurred.   On exam, wound is superficial with avulsion of the skin.  No indication for suturing and no indication for imaging at this time as fractures felt less likely.  Wound appears clean, no foreign body seen.  Plan to clean wound here apply Xeroform gauze and dressing.  Will update his Td.  Amount and/or Complexity of Data Reviewed Discussion of management or test interpretation with external provider(s): Skin avulsion distal fingertip no nail or bone involvement.  No foreign body seen.  Sensation intact and he has full range of motion at the DIP wound cleaned and dressed with Xeroform. Td updated.    Patient agrees to wound care instructions.  Close outpatient follow-up and ER return precautions were  also discussed.  Risk Prescription drug management.           Final Clinical Impression(s) / ED Diagnoses Final diagnoses:  Avulsion of skin of index finger, initial encounter    Rx / DC Orders ED Discharge Orders     None         Pauline Aus, PA-C 11/10/22 1010    Benjiman Core, MD 11/10/22 1507

## 2022-11-10 NOTE — ED Triage Notes (Signed)
Pt reports hitting left index finger on saw blade yesterday. Tetanus up to date per patient. Bleeding controlled.

## 2022-11-19 ENCOUNTER — Inpatient Hospital Stay: Payer: 59 | Attending: Hematology

## 2022-11-19 DIAGNOSIS — C8338 Diffuse large B-cell lymphoma, lymph nodes of multiple sites: Secondary | ICD-10-CM | POA: Insufficient documentation

## 2022-11-19 DIAGNOSIS — E2740 Unspecified adrenocortical insufficiency: Secondary | ICD-10-CM | POA: Insufficient documentation

## 2022-11-19 DIAGNOSIS — Z7952 Long term (current) use of systemic steroids: Secondary | ICD-10-CM | POA: Insufficient documentation

## 2022-11-19 DIAGNOSIS — G629 Polyneuropathy, unspecified: Secondary | ICD-10-CM | POA: Insufficient documentation

## 2022-11-19 DIAGNOSIS — Z79899 Other long term (current) drug therapy: Secondary | ICD-10-CM | POA: Insufficient documentation

## 2022-11-20 ENCOUNTER — Inpatient Hospital Stay: Payer: 59

## 2022-11-20 DIAGNOSIS — G629 Polyneuropathy, unspecified: Secondary | ICD-10-CM | POA: Diagnosis not present

## 2022-11-20 DIAGNOSIS — E2740 Unspecified adrenocortical insufficiency: Secondary | ICD-10-CM | POA: Diagnosis not present

## 2022-11-20 DIAGNOSIS — Z79899 Other long term (current) drug therapy: Secondary | ICD-10-CM | POA: Diagnosis not present

## 2022-11-20 DIAGNOSIS — Z7952 Long term (current) use of systemic steroids: Secondary | ICD-10-CM | POA: Diagnosis not present

## 2022-11-20 DIAGNOSIS — C8338 Diffuse large B-cell lymphoma, lymph nodes of multiple sites: Secondary | ICD-10-CM | POA: Diagnosis not present

## 2022-11-20 LAB — CBC WITH DIFFERENTIAL/PLATELET
Abs Immature Granulocytes: 0.06 10*3/uL (ref 0.00–0.07)
Basophils Absolute: 0.1 10*3/uL (ref 0.0–0.1)
Basophils Relative: 0 %
Eosinophils Absolute: 0.1 10*3/uL (ref 0.0–0.5)
Eosinophils Relative: 1 %
HCT: 51.6 % (ref 39.0–52.0)
Hemoglobin: 17 g/dL (ref 13.0–17.0)
Immature Granulocytes: 1 %
Lymphocytes Relative: 21 %
Lymphs Abs: 2.5 10*3/uL (ref 0.7–4.0)
MCH: 30.4 pg (ref 26.0–34.0)
MCHC: 32.9 g/dL (ref 30.0–36.0)
MCV: 92.3 fL (ref 80.0–100.0)
Monocytes Absolute: 0.4 10*3/uL (ref 0.1–1.0)
Monocytes Relative: 3 %
Neutro Abs: 8.9 10*3/uL — ABNORMAL HIGH (ref 1.7–7.7)
Neutrophils Relative %: 74 %
Platelets: 190 10*3/uL (ref 150–400)
RBC: 5.59 MIL/uL (ref 4.22–5.81)
RDW: 15.6 % — ABNORMAL HIGH (ref 11.5–15.5)
WBC: 11.9 10*3/uL — ABNORMAL HIGH (ref 4.0–10.5)
nRBC: 0 % (ref 0.0–0.2)

## 2022-11-20 LAB — COMPREHENSIVE METABOLIC PANEL
ALT: 19 U/L (ref 0–44)
AST: 25 U/L (ref 15–41)
Albumin: 4 g/dL (ref 3.5–5.0)
Alkaline Phosphatase: 61 U/L (ref 38–126)
Anion gap: 11 (ref 5–15)
BUN: 16 mg/dL (ref 8–23)
CO2: 32 mmol/L (ref 22–32)
Calcium: 9.8 mg/dL (ref 8.9–10.3)
Chloride: 97 mmol/L — ABNORMAL LOW (ref 98–111)
Creatinine, Ser: 0.92 mg/dL (ref 0.61–1.24)
GFR, Estimated: >60 mL/min (ref >60)
Glucose, Bld: 123 mg/dL — ABNORMAL HIGH (ref 70–99)
Potassium: 4.1 mmol/L (ref 3.5–5.1)
Sodium: 140 mmol/L (ref 135–145)
Total Bilirubin: 2.4 mg/dL — ABNORMAL HIGH (ref 0.3–1.2)
Total Protein: 7.1 g/dL (ref 6.5–8.1)

## 2022-11-20 LAB — LACTATE DEHYDROGENASE: LDH: 173 U/L (ref 98–192)

## 2022-11-25 NOTE — Progress Notes (Signed)
[facility] [address], [place] [address]    Clinic Day:  11/25/2022  Referring physician: Rebecka Apley, NP  Patient Care Team: Rebecka Apley, NP as PCP - General (Adult Health Nurse Practitioner) Doreatha Massed, MD as Medical Oncologist (Medical Oncology)   ASSESSMENT & PLAN:   Assessment: 1.  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  High-grade lymphoma panel was negative. - Weight loss of 40 pounds in the last 4 months, stable weight over the last 1 month.  No fevers or night sweats.  -PET CT scan on 03/16/2021 which showed hypermetabolic enlarged spleen.  There are hypermetabolic neck lymph nodes.  Diffuse hypermetabolic activity within the marrow space of the spine.  Diffuse groundglass density within the lungs with moderate activity. - 6 cycles of R-CHOP from 04/13/2021 through 08/14/2021 - PET scan (09/22/2021): No evidence of metabolically active lymphoma, [place] 1.    2.  Social/family history: - He lives at home by himself.  His son is present today with him. - He is independent of ADLs and IADLs.  Son lives within 30 minutes.  He was never smoker. - He has work-related exposure to dyes.  No pesticide exposure. - Brother died of bone cancer.  Another brother also died of bone cancer.    Plan: 1.  Diffuse large B-cell lymphoma, stage IVb: - Denies any B symptoms. - No palpable adenopathy. - Labs shows normal LFTs and chronically elevated total bilirubin stable.  LDH normal.  CBC grossly normal with no pancytopenia. - Recommend follow-up with labs in 4 months.    2.  Parotid masses: - Posttreatment PET scan showed bilateral hypermetabolic parotid nodules, clinically consistent with Warthin's tumor's. - He has seen Dr. Alinda Sierras before but never followed up  for biopsy. - Will make another referral.    3.  Peripheral neuropathy: - This is mostly in the feet with burning sensation. - Continue Lyrica 150 mg twice daily.  4.  Adrenal insufficiency: - He is on prednisone 10 mg daily and fludrocortisone 0.1 mg daily.  No orders of the defined types were placed in this encounter.      Alben Deeds Teague,acting as a Neurosurgeon for Doreatha Massed, MD.,have documented all relevant documentation on the behalf of Doreatha Massed, MD,as directed by  Doreatha Massed, MD while in the presence of Doreatha Massed, MD.  ***   [place] R Teague   7/21/20242:52 PM  CHIEF COMPLAINT:   Diagnosis: diffuse large B-cell lymphoma, stage IVb     Cancer Staging  Large B-cell lymphoma (HCC) Staging form: Hodgkin and Non-Hodgkin Lymphoma, AJCC 8th Edition - Clinical stage from 03/22/2021: Stage IV (Diffuse large B-cell lymphoma) - Signed by Doreatha Massed, MD on 03/22/2021    Prior Therapy:  6 cycles of R-CHOP completed on 08/14/2021  Current Therapy:  Surveillance     HISTORY OF PRESENT ILLNESS:   Oncology History  Large B-cell lymphoma (HCC)  03/22/2021 Initial Diagnosis   DLBCL (diffuse large B cell lymphoma) (HCC)   03/22/2021 Cancer Staging   Staging form: Hodgkin and Non-Hodgkin Lymphoma, AJCC 8th Edition - Clinical stage from 03/22/2021: Stage IV (Diffuse large B-cell lymphoma) - Signed by Doreatha Massed, MD on 03/22/2021   04/12/2021 - 08/16/2021 Chemotherapy   Patient is on Treatment Plan : NON-HODGKINS LYMPHOMA R-CHOP q21d        INTERVAL  HISTORY:   Roy [name] is a 72 y.o. male presenting to clinic today for follow up of diffuse large B-cell lymphoma, stage IVb. He was last seen by me on 06/28/22.  Since his last visit, he presented to the ED on 3/1, 4/21, 5/1, and 7/6. On 3/1 he was diagnosed with dysuria and urinalysis found large amounts of hemoglobin and small amounts of leukocytes. He was given cefpodoxime  proxetil and discharged the same day. On 4/21 he was diagnosed with bronchospasm and was given medication breathing treatments and IV Lasix. He was discharged on 4/22 as his condition improved. On 5/1 he presented with bilateral leg swelling. Chest X rays found enlarged cardiopericardial silhouette with vascular congestion and EKG showed junctional rhythm versus atrial fibrillation rate of 34. Cardiology diagnosed him with a complete heart block and symptomatic bradycardia. He was given Lasix and had a pacemaker implanted on 5/3. He was discharged on 5/4 as his condition slowly improved. On 7/6, he presented with a laceration to the distal tip of the right index finger and given Xeroform, gauze, and dressing.   Today, he states that he is doing well overall. His appetite level is at ***%. His energy level is at ***%.    PAST MEDICAL HISTORY:   Past Medical History: Past Medical History:  Diagnosis Date   Atrial fibrillation (HCC)    Cancer (HCC)    Diffuse Large B-Cell lymphoma   GERD (gastroesophageal reflux disease)    High cholesterol    Hypertension    Obesity     Surgical History: Past Surgical History:  Procedure Laterality Date   APPENDECTOMY     CATARACT EXTRACTION Bilateral 2022   CHOLECYSTECTOMY     ESOPHAGEAL DILATION     multiple times   IR IMAGING GUIDED PORT INSERTION  03/31/2021   JOINT REPLACEMENT Left    hip   PACEMAKER IMPLANT N/A 09/07/2022   Procedure: PACEMAKER IMPLANT;  Surgeon: Regan Lemming, MD;  Location: [facility] INVASIVE CV LAB;  Service: Cardiovascular;  Laterality: N/A;    Social History: Social History   Socioeconomic History   Marital status: Widowed    Spouse name: Not on file   Number of children: 3   Years of education: Not on file   Highest education level: Not on file  Occupational History   Occupation: Retired  Tobacco Use   Smoking status: Never    Passive exposure: Never   Smokeless tobacco: Never  Vaping Use   Vaping status:  Never Used  Substance and Sexual Activity   Alcohol use: No   Drug use: No   Sexual activity: Not Currently  Other Topics Concern   Not on file  Social History Narrative   Not on file   Social Determinants of Health   Financial Resource Strain: Low Risk  (11/12/2022)   Received from [facility]   Overall Financial Resource Strain (CARDIA)    Difficulty of Paying Living Expenses: Not hard at all  Food Insecurity: No Food Insecurity (11/12/2022)   Received from [facility]   Hunger Vital Sign    Worried About Running Out of Food in the Last Year: Never true    Ran Out of Food in the Last Year: Never true  Transportation Needs: No Transportation Needs (11/12/2022)   Received from [facility] - Transportation    Lack of Transportation (Medical): No    Lack of Transportation (Non-Medical): No  Physical Activity: Sufficiently Active (05/02/2022)   Received from [facility],  [facility]   Exercise Vital Sign    Days of Exercise per Week: 6 days    Minutes of Exercise per Session: 60 min  Stress: No Stress Concern Present (05/02/2022)   Received from [facility], [facility] - Occupational Stress Questionnaire    Feeling of Stress : Not at all  Social Connections: Socially Integrated (05/02/2022)   Received from [facility], [facility]   Social Network    How would you rate your social network (family, work, friends)?: Good participation with social networks  Intimate Partner Violence: Not At Risk (05/02/2022)   Received from [facility], [facility]   HITS    Over the last 12 months how often did your partner physically hurt you?: 1    Over the last 12 months how often did your partner insult you or talk down to you?: 1    Over the last 12 months how often did your partner threaten you with physical harm?: 1    Over the last 12 months how often did your partner scream or curse at you?: 1    Family  History: Family History  Problem Relation Age of Onset   Heart failure Father    Heart attack Father        Deceased    Thyroid disease Sister     Current Medications:  Current Outpatient Medications:    acetaminophen (TYLENOL) 325 MG tablet, Take 2 tablets (650 mg total) by mouth every 6 (six) hours as needed for headache., Disp: 30 tablet, Rfl: 1   alfuzosin (UROXATRAL) 10 MG 24 hr tablet, TAKE ONE TABLET DAILY WITH BREAKFAST, Disp: 90 tablet, Rfl: 3   ELIQUIS 5 MG TABS tablet, Take 1 tablet (5 mg total) by mouth 2 (two) times daily. Resume from may 9, Disp: 60 tablet, Rfl:    EQUATE STOOL SOFTENER 100 MG capsule, Take 2 capsules by mouth twice daily, Disp: 120 capsule, Rfl: 0   FEROSUL 325 (65 Fe) MG tablet, Take 1 tablet by mouth once daily with breakfast (Patient not taking: Reported on 09/05/2022), Disp: 30 tablet, Rfl: 0   levocetirizine (XYZAL) 5 MG tablet, Take 5 mg by mouth every evening., Disp: , Rfl:    levothyroxine (SYNTHROID) 100 MCG tablet, TAKE ONE TABLET ONCE DAILY BEFORE BREAKFAST, Disp: 90 tablet, Rfl: 1   magnesium oxide (MAG-OX) 400 (240 Mg) MG tablet, Take 400 mg by mouth daily., Disp: , Rfl:    omeprazole (PRILOSEC) 10 MG capsule, Take 20 mg by mouth daily., Disp: , Rfl:    potassium chloride (KLOR-CON) 10 MEQ tablet, TAKE TWO TABLETS TWICE DAILY, Disp: 360 tablet, Rfl: 0   predniSONE (DELTASONE) 10 MG tablet, TAKE ONE TABLET ONCE DAILY WITH BREAKFAST, Disp: 90 tablet, Rfl: 0   pregabalin (LYRICA) 150 MG capsule, Take 150 mg by mouth 2 (two) times daily., Disp: , Rfl:    rosuvastatin (CRESTOR) 5 MG tablet, Take 1 tablet by mouth at bedtime., Disp: , Rfl:    valACYclovir (VALTREX) 1000 MG tablet, Take 1,000 mg by mouth daily., Disp: , Rfl:    vitamin B-12 (CYANOCOBALAMIN) 1000 MCG tablet, Take 1 tablet (1,000 mcg total) by mouth daily., Disp: 30 tablet, Rfl: 3   Allergies: Allergies  Allergen Reactions   Adhesive [Tape]     Contact dermatitis   Sulfa Antibiotics      UNKNOWN Childhood reaction    REVIEW OF SYSTEMS:   Review of Systems  Constitutional:  Negative  for chills, fatigue and fever.  HENT:   Negative for lump/mass, mouth sores, nosebleeds, sore throat and trouble swallowing.   Eyes:  Negative for eye problems.  Respiratory:  Negative for cough and shortness of breath.   Cardiovascular:  Negative for chest pain, leg swelling and palpitations.  Gastrointestinal:  Negative for abdominal pain, constipation, diarrhea, nausea and vomiting.  Genitourinary:  Negative for bladder incontinence, difficulty urinating, dysuria, frequency, hematuria and nocturia.   Musculoskeletal:  Negative for arthralgias, back pain, flank pain, myalgias and neck pain.  Skin:  Negative for itching and rash.  Neurological:  Negative for dizziness, headaches and numbness.  Hematological:  Does not bruise/bleed easily.  Psychiatric/Behavioral:  Negative for depression, sleep disturbance and suicidal ideas. The patient is not nervous/anxious.   All other systems reviewed and are negative.    VITALS:   There were no vitals taken for this visit.  Wt Readings from Last 3 Encounters:  10/11/22 290 lb 6.4 oz (131.7 kg)  09/07/22 291 lb 11.2 oz (132.3 kg)  08/26/22 270 lb (122.5 kg)    There is no height or weight on file to calculate BMI.  Performance status (ECOG): 1 - Symptomatic but completely ambulatory  PHYSICAL EXAM:   Physical Exam Vitals and nursing note reviewed. Exam conducted with a chaperone present.  Constitutional:      Appearance: Normal appearance.  Cardiovascular:     Rate and Rhythm: Normal rate and regular rhythm.     Pulses: Normal pulses.     Heart sounds: Normal heart sounds.  Pulmonary:     Effort: Pulmonary effort is normal.     Breath sounds: Normal breath sounds.  Abdominal:     Palpations: Abdomen is soft. There is no hepatomegaly, splenomegaly or mass.     Tenderness: There is no abdominal tenderness.  Musculoskeletal:      Right lower leg: No edema.     Left lower leg: No edema.  Lymphadenopathy:     Cervical: No cervical adenopathy.     Right cervical: No superficial, deep or posterior cervical adenopathy.    Left cervical: No superficial, deep or posterior cervical adenopathy.     Upper Body:     Right upper body: No supraclavicular or axillary adenopathy.     Left upper body: No supraclavicular or axillary adenopathy.  Neurological:     General: No focal deficit present.     Mental Status: He is alert and oriented to person, place, and time.  Psychiatric:        Mood and Affect: Mood normal.        Behavior: Behavior normal.     LABS:      Latest Ref Rng & Units 11/20/2022    2:12 PM 09/06/2022    1:50 AM 09/05/2022   12:19 PM  CBC  WBC 4.0 - 10.5 K/uL 11.9  10.4  9.2   Hemoglobin 13.0 - 17.0 g/dL 44.0  34.7  42.5   Hematocrit 39.0 - 52.0 % 51.6  44.9  46.1   Platelets 150 - 400 K/uL 190  156  164       Latest Ref Rng & Units 11/20/2022    2:12 PM 10/04/2022    1:08 PM 09/08/2022    1:38 AM  CMP  Glucose 70 - 99 mg/dL 956  387  96   BUN 8 - 23 mg/dL 16  11  21    Creatinine 0.61 - 1.24 mg/dL 5.64  3.32  9.51   Sodium 135 -  145 mmol/L 140  143  137   Potassium 3.5 - 5.1 mmol/L 4.1  3.7  3.4   Chloride 98 - 111 mmol/L 97  100  96   CO2 22 - 32 mmol/L 32  31  31   Calcium 8.9 - 10.3 mg/dL 9.8  9.3  8.8   Total Protein 6.5 - 8.1 g/dL 7.1  6.1    Total Bilirubin 0.3 - 1.2 mg/dL 2.4  0.9    Alkaline Phos 38 - 126 U/L 61  88    AST 15 - 41 U/L 25  25    ALT 0 - 44 U/L 19  29       No results found for: "CEA1", "CEA" / No results found for: "CEA1", "CEA" No results found for: "PSA1" No results found for: "VFI433" No results found for: "CAN125"  Lab Results  Component Value Date   TOTALPROTELP 6.1 02/28/2021   ALBUMINELP 3.5 02/21/2021   A1GS 0.3 02/21/2021   A2GS 0.8 02/21/2021   BETS 1.0 02/21/2021   GAMS 0.8 02/21/2021   MSPIKE Not Observed 02/21/2021   SPEI Comment 02/21/2021    Lab Results  Component Value Date   TIBC 416 02/21/2021   TIBC 369 11/11/2020   FERRITIN 152 02/21/2021   IRONPCTSAT 17 (L) 02/21/2021   IRONPCTSAT 41 (H) 11/11/2020   Lab Results  Component Value Date   LDH 173 11/20/2022   LDH 169 06/21/2022   LDH 141 02/20/2022     STUDIES:   No results found.

## 2022-11-26 ENCOUNTER — Inpatient Hospital Stay (HOSPITAL_BASED_OUTPATIENT_CLINIC_OR_DEPARTMENT_OTHER): Payer: 59 | Admitting: Hematology

## 2022-11-26 VITALS — BP 108/56 | HR 66 | Temp 97.9°F | Resp 18 | Ht 72.0 in | Wt 297.1 lb

## 2022-11-26 DIAGNOSIS — C8338 Diffuse large B-cell lymphoma, lymph nodes of multiple sites: Secondary | ICD-10-CM | POA: Diagnosis not present

## 2022-11-26 NOTE — Patient Instructions (Addendum)
[facility]  Discharge Instructions  You were seen and examined today by Dr. Ellin Saba.  Your labs are stable.  We will send you back to Dr. Marene Lenz for biopsy.  Follow-up as scheduled.  Thank you for choosing [facility] to provide your oncology and hematology care.   To afford each patient quality time with our provider, please arrive at least 15 minutes before your scheduled appointment time. You may need to reschedule your appointment if you arrive late (10 or more minutes). Arriving late affects you and other patients whose appointments are after yours.  Also, if you miss three or more appointments without notifying the office, you may be dismissed from the clinic at the provider's discretion.    Again, thank you for choosing [facility].  Our hope is that these requests will decrease the amount of time that you wait before being seen by our physicians.   If you have a lab appointment with the Cancer Center - please note that after April 8th, all labs will be drawn in the cancer center.  You do not have to check in or register with the main entrance as you have in the past but will complete your check-in at the cancer center.            _____________________________________________________________  Should you have questions after your visit to [facility], please contact our office at [phone] and follow the prompts.  Our office hours are 8:00 a.m. to 4:30 p.m. Monday - Thursday and 8:00 a.m. to 2:30 p.m. Friday.  Please note that voicemails left after 4:00 p.m. may not be returned until the following business day.  We are closed weekends and all major holidays.  You do have access to a nurse 24-7, just call the main number to the clinic [phone] and do not press any options, hold on the line and a nurse will answer the phone.    For prescription refill requests, have your pharmacy contact our  office and allow 72 hours.    Masks are no longer required in the cancer centers. If you would like for your care team to wear a mask while they are taking care of you, please let them know. You may have one support person who is at least 73 years old accompany you for your appointments.

## 2022-11-29 ENCOUNTER — Inpatient Hospital Stay: Payer: 59

## 2022-11-29 DIAGNOSIS — C8338 Diffuse large B-cell lymphoma, lymph nodes of multiple sites: Secondary | ICD-10-CM | POA: Diagnosis not present

## 2022-11-29 MED ORDER — HEPARIN SOD (PORK) LOCK FLUSH 100 UNIT/ML IV SOLN
500.0000 [IU] | Freq: Once | INTRAVENOUS | Status: AC
  Administered 2022-11-29: 500 [IU] via INTRAVENOUS

## 2022-11-29 MED ORDER — SODIUM CHLORIDE 0.9% FLUSH
10.0000 mL | Freq: Once | INTRAVENOUS | Status: AC
  Administered 2022-11-29: 10 mL

## 2022-11-29 NOTE — Progress Notes (Signed)
Cedric Fishman presented for Portacath access and flush.  Portacath located right chest wall accessed with  H 20 needle.  Good blood return present. Portacath flushed with 20ml NS and 500U/34ml Heparin and needle removed intact.  Procedure tolerated well and without incident.     No complaints at this time. Discharged from clinic ambulatory in stable condition. Alert and oriented x 3. F/U with [facility] as scheduled.

## 2022-12-06 ENCOUNTER — Encounter: Payer: Self-pay | Admitting: Otolaryngology

## 2022-12-10 ENCOUNTER — Ambulatory Visit (INDEPENDENT_AMBULATORY_CARE_PROVIDER_SITE_OTHER): Payer: 59

## 2022-12-10 DIAGNOSIS — I442 Atrioventricular block, complete: Secondary | ICD-10-CM | POA: Diagnosis not present

## 2022-12-11 ENCOUNTER — Encounter: Payer: Self-pay | Admitting: General Practice

## 2022-12-11 ENCOUNTER — Other Ambulatory Visit: Payer: Self-pay | Admitting: "Endocrinology

## 2022-12-11 ENCOUNTER — Other Ambulatory Visit (HOSPITAL_COMMUNITY): Payer: Self-pay | Admitting: Otolaryngology

## 2022-12-11 DIAGNOSIS — K118 Other diseases of salivary glands: Secondary | ICD-10-CM

## 2022-12-11 DIAGNOSIS — E2740 Unspecified adrenocortical insufficiency: Secondary | ICD-10-CM

## 2022-12-11 NOTE — Progress Notes (Signed)
Richarda Overlie, MD  Caroleen Hamman, NT PROCEDURE / BIOPSY REVIEW Date: 12/11/22  Requested Biopsy site: Right parotid nodule (recent note mentions right parotid nodule) Reason for request: ENT request Imaging review: Right inferior parotid nodule (deep).  Seen on multiple studies  Decision: Approved Imaging modality to perform: Ultrasound Schedule with: Patient preference (Local vs Mod Sed) Schedule for: Any VIR  Additional comments: Nodule is very deep, I would have cytology available.    Please contact me with questions, concerns, or if issue pertaining to this request arise.  Arn Medal, MD [facility]       Previous Messages    ----- Message ----- From: Caroleen Hamman, NT Sent: 12/11/2022   9:34 AM EDT To: [facility] Procedure Requests Subject: [place] FNA SALIVARY GLAND/PAROTID GLAND            Procedure: [place] FNA SALIVARY GLAND/PAROTID GLAND  Reason: Parotid Mass  History: CT and NM in chart from 2023  Provider: Laren Boom, DO  Contact: [phone]

## 2022-12-21 NOTE — Progress Notes (Signed)
Remote pacemaker transmission.   

## 2022-12-25 ENCOUNTER — Encounter: Payer: Self-pay | Admitting: Cardiology

## 2022-12-25 ENCOUNTER — Ambulatory Visit: Payer: 59 | Attending: Cardiology | Admitting: Cardiology

## 2022-12-25 VITALS — BP 160/104 | HR 82 | Ht 72.0 in | Wt 301.8 lb

## 2022-12-25 DIAGNOSIS — I4819 Other persistent atrial fibrillation: Secondary | ICD-10-CM | POA: Diagnosis not present

## 2022-12-25 DIAGNOSIS — I442 Atrioventricular block, complete: Secondary | ICD-10-CM | POA: Diagnosis not present

## 2022-12-25 DIAGNOSIS — D6869 Other thrombophilia: Secondary | ICD-10-CM

## 2022-12-25 DIAGNOSIS — I5032 Chronic diastolic (congestive) heart failure: Secondary | ICD-10-CM

## 2022-12-25 DIAGNOSIS — I10 Essential (primary) hypertension: Secondary | ICD-10-CM

## 2022-12-25 MED ORDER — FUROSEMIDE 40 MG PO TABS: 40.0000 mg | ORAL_TABLET | Freq: Every day | ORAL | 0 refills | Status: DC

## 2022-12-25 NOTE — Patient Instructions (Addendum)
Medication Instructions:  Your physician has recommended you make the following change in your medication: TAKE Lasix 40 mg once daily for 7 days  *If you need a refill on your cardiac medications before your next appointment, please call your pharmacy*   Lab Work: None ordered   Testing/Procedures: None ordered   Follow-Up: At [facility], you and your health needs are our priority.  As part of our continuing mission to provide you with exceptional heart care, we have created designated Provider Care Teams.  These Care Teams include your primary Cardiologist (physician) and Advanced Practice Providers (APPs -  Physician Assistants and Nurse Practitioners) who all work together to provide you with the care you need, when you need it.  Remote monitoring is used to monitor your Pacemaker or ICD from home. This monitoring reduces the number of office visits required to check your device to one time per year. It allows [place] to keep an eye on the functioning of your device to ensure it is working properly. You are scheduled for a device check from home on 03/11/2023. You may send your transmission at any time that day. If you have a wireless device, the transmission will be sent automatically. After your physician reviews your transmission, you will receive a postcard with your next transmission date.  Your next appointment:   1 year(s)  The format for your next appointment:   In Person  Provider:   You will see one of the following Advanced Practice Providers on your designated Care Team:   [name], [place] "Mardelle Matte" Cooper, [place] Canary Brim, NP    Thank you for choosing [facility]!!   Dory Horn, RN [phone]

## 2022-12-25 NOTE — Progress Notes (Signed)
Electrophysiology Office Note:   Date:  12/25/2022  ID:  FILIMON WETTER, DOB March 10, 1951, MRN [id number]  Primary Cardiologist: None Electrophysiologist: Nakeia Calvi Jorja Loa, MD      History of Present Illness:   Roy [name] is a 72 y.o. male with h/o atrial fibrillation, hypothyroidism, obesity, diffuse large B-cell lymphoma, adrenal insufficiency on Florinef and prednisone seen today for routine electrophysiology followup.  Since last being seen in our clinic the patient reports doing well since his pacemaker was implanted.  He has no acute cardiac complaints at this time.  He is concerned about his shortness of breath and his weight gain.  He has been on prednisone for many years.  His son, who is in the room, feels that his shortness of breath has improved since the pacemaker was implanted, though he still at times has to sleep in a chair.  he denies chest pain, palpitations, nausea, vomiting, dizziness, syncope, edema, weight gain, or early satiety.   Review of systems complete and found to be negative unless listed in HPI.      EP Information / Studies Reviewed:    EKG is ordered today. Personal review as below.  EKG Interpretation Date/Time:  Tuesday December 25 2022 16:20:32 EDT Ventricular Rate:  82 PR Interval:    QRS Duration:  140 QT Interval:  424 QTC Calculation: 495 R Axis:   -37  Text Interpretation: Atrial fibrillation Ventricular-paced rhythm with occasional Premature ventricular complexes When compared with ECG of 08-Sep-2022 05:45, No significant change since last tracing Confirmed by Taraneh Metheney ([id number]) on 12/25/2022 5:02:01 PM   PPM Interrogation-  reviewed in detail today,  See PACEART report.  Device History: Medtronic Dual Chamber PPM implanted 09/07/22 for CHB  Risk Assessment/Calculations:    CHA2DS2-VASc Score = 2   This indicates a 2.2% annual risk of stroke. The patient's score is based upon: CHF History: 0 HTN History: 1 Diabetes History:  0 Stroke History: 0 Vascular Disease History: 0 Age Score: 1 Gender Score: 0            Physical Exam:   VS:  BP (!) 160/104 (BP Location: Left Arm, Patient Position: Sitting, Cuff Size: Large)   Pulse 82   Ht 6' (1.829 m)   Wt (!) 301 lb 12.8 oz (136.9 kg)   SpO2 98%   BMI 40.93 kg/m    Wt Readings from Last 3 Encounters:  12/25/22 (!) 301 lb 12.8 oz (136.9 kg)  11/26/22 297 lb 1.6 oz (134.8 kg)  10/11/22 290 lb 6.4 oz (131.7 kg)     GEN: Well nourished, well developed in no acute distress NECK: No JVD; No carotid bruits CARDIAC: Regular rate and rhythm, no murmurs, rubs, gallops RESPIRATORY:  Clear to auscultation without rales, wheezing or rhonchi  ABDOMEN: Soft, non-tender, non-distended EXTREMITIES:  No edema; No deformity   ASSESSMENT AND PLAN:    CHB s/p Medtronic PPM  Normal PPM function See Pace Art report Set to VVIR today.  2.  Hypertension: Blood pressure elevated today.  Has been controlled on prior checks.  No changes.  3.  Likely diastolic heart failure: Worsened by his Florinef and prednisone.  He Alistair Senft need diuresis.  Onika Gudiel plan for Lasix 40 mg daily for the next week.  If this improves his symptoms, he may need daily Lasix.  4.  Persistent atrial fibrillation: Continue Eliquis  5.  Secondary hypercoagulable state: Currently on Eliquis for atrial fibrillation  Disposition:   Follow up with EP APP in  12 months  Signed, Gerri Acre Jorja Loa, MD

## 2023-01-03 ENCOUNTER — Ambulatory Visit (INDEPENDENT_AMBULATORY_CARE_PROVIDER_SITE_OTHER): Payer: 59 | Admitting: Urology

## 2023-01-03 VITALS — BP 143/95 | HR 89 | Ht 72.0 in | Wt 301.8 lb

## 2023-01-03 DIAGNOSIS — R350 Frequency of micturition: Secondary | ICD-10-CM

## 2023-01-03 DIAGNOSIS — R339 Retention of urine, unspecified: Secondary | ICD-10-CM

## 2023-01-03 DIAGNOSIS — N3941 Urge incontinence: Secondary | ICD-10-CM

## 2023-01-03 DIAGNOSIS — Z87440 Personal history of urinary (tract) infections: Secondary | ICD-10-CM

## 2023-01-03 DIAGNOSIS — N401 Enlarged prostate with lower urinary tract symptoms: Secondary | ICD-10-CM | POA: Diagnosis not present

## 2023-01-03 DIAGNOSIS — N400 Enlarged prostate without lower urinary tract symptoms: Secondary | ICD-10-CM

## 2023-01-03 LAB — URINALYSIS, ROUTINE W REFLEX MICROSCOPIC
Bilirubin, UA: NEGATIVE
Glucose, UA: NEGATIVE
Ketones, UA: NEGATIVE
Leukocytes,UA: NEGATIVE
Nitrite, UA: NEGATIVE
Protein,UA: NEGATIVE
RBC, UA: NEGATIVE
Specific Gravity, UA: 1.015 (ref 1.005–1.030)
Urobilinogen, Ur: 1 mg/dL (ref 0.2–1.0)
pH, UA: 7.5 (ref 5.0–7.5)

## 2023-01-03 LAB — BLADDER SCAN AMB NON-IMAGING: Scan Result: 0

## 2023-01-03 MED ORDER — ALFUZOSIN HCL ER 10 MG PO TB24: ORAL_TABLET | ORAL | 3 refills | Status: DC

## 2023-01-03 NOTE — Progress Notes (Signed)
Subjective: 1. Urge incontinence of urine   2. Benign prostatic hyperplasia, unspecified whether lower urinary tract symptoms present   3. Urinary frequency   4. Personal history of urinary infection   5. Incomplete bladder emptying      01/03/23: Roy [name] returns today in f/u.  He has a history of BPH with BOO with UUI and UTI's.  He has increased frequency since starting furosemide 40mg  daily for a week.  He stopped it Weds.  The frequency is improving.  He continues to have UUI which has worsened since his last visit.  He has no dysuria or hematuria.  He has gained weight on prednisone, 60lb in 3 months.  His PVR is 0ml.  He remains on alfuzosin.  His UA is clear today.     07/12/22: Roy [name] returns today in f/u.  His culture finally came back today with 2 bacteria including E. Coli and Klebsiella.  He was switched to [facility] in the ER on Friday night when he went with dehydration.  He is feeling much better with reduced frequency and urgency.  His UA is unremarkable today.  In the ER his cr was 1.34 and his WBC was 14.6.  THe UA had 11-20 WBC. A CT was done that showed some bladder changes suggestive of cystitis.   07/05/22: Roy [name] returns today with the onset last night of cloudy urine with frequency, urgency and small dribbling voids.  He had no fever.  He remains on alfuzosin for BPH with BOO and his PVR is 0ml.  His UA looks infected today.  He has had no gross hematuria.   CMP and CBC were unremarkable on 06/21/21 apart from a mild leukocytosis and a T.B. of 2.4.   02/01/22: Roy [name] returns today in f/u.  He is on Uroxatrol and off of Gemtesa since his last visit.  He is voiding better.  He has urgency and can have some accidents if not careful.  He can have some terminal intermittency. He has rare nocturia.   His PVR is down to 10ml from over .  His UA is clear.      11/30/21: Roy [name] returns today in f/u for cystoscopy.  He had blurry vision and dizziness with trospium and stopped it after 10 days.   His nocturia is down to 1x.  He has daytime frequency q1hr.  He was told to drink 6 bottles of water daily.  He can avoid the UUI.  His PVR is 0ml.    10/19/21: Roy [name] is a 72 yo male who is sent for BPH with BOO and UUI.  He had a bout of food poisoning about 3 weeks ago with N/V and diarrhea but the UUI has been going on for 3-4 months.  He will leak every time he makes a step.  He is not wearing pads.  He has no hematuria or dysuria.  He has nocturia x 3 with UUI.  He has daytime frequency 10x with UUI as well.  He has no SUI.  He has a reduced stream and doesn't feel empty.  He had a CT AP on 09/27/21 and had no GU abnormalities. His bladder was not distended and his prostate wasn't too large.   He has had no UTI's or Gu surgery.  His UA is clear today.  He is on tamsulosin but that hasn't helped.  He had labs on 09/27/21 and had normal renal function.  His Bili was 2.1 with an ALT of 47 and some changes suggestive of  cirrhosis on the CT.   He had a UA with many bacteria in 3/23 with Mx species on the culture but a negative UA on 09/27/21.  I don't see a PSA in his labs.      ROS:  Review of Systems  HENT:  Positive for congestion.   Neurological:  Positive for headaches.    Allergies  Allergen Reactions   Adhesive [Tape]     Contact dermatitis   Sulfa Antibiotics     UNKNOWN Childhood reaction    Past Medical History:  Diagnosis Date   Atrial fibrillation (HCC)    Cancer (HCC)    Diffuse Large B-Cell lymphoma   GERD (gastroesophageal reflux disease)    High cholesterol    Hypertension    Obesity     Past Surgical History:  Procedure Laterality Date   APPENDECTOMY     CATARACT EXTRACTION Bilateral 2022   CHOLECYSTECTOMY     ESOPHAGEAL DILATION     multiple times   IR IMAGING GUIDED PORT INSERTION  03/31/2021   JOINT REPLACEMENT Left    hip   PACEMAKER IMPLANT N/A 09/07/2022   Procedure: PACEMAKER IMPLANT;  Surgeon: Regan Lemming, MD;  Location: [facility] INVASIVE CV LAB;   Service: Cardiovascular;  Laterality: N/A;    Social History   Socioeconomic History   Marital status: Widowed    Spouse name: Not on file   Number of children: 3   Years of education: Not on file   Highest education level: Not on file  Occupational History   Occupation: Retired  Tobacco Use   Smoking status: Never    Passive exposure: Never   Smokeless tobacco: Never  Vaping Use   Vaping status: Never Used  Substance and Sexual Activity   Alcohol use: No   Drug use: No   Sexual activity: Not Currently  Other Topics Concern   Not on file  Social History Narrative   Not on file   Social Determinants of Health   Financial Resource Strain: Low Risk  (11/12/2022)   Received from [facility]   Overall Financial Resource Strain (CARDIA)    Difficulty of Paying Living Expenses: Not hard at all  Food Insecurity: Low Risk  (11/30/2022)   Received from [facility]   Food vital sign    Within the past 12 months, you worried that your food would run out before you got money to buy more: Never true    Within the past 12 months, the food you bought just didn't last and you didn't have money to get more. : Never true  Transportation Needs: No Transportation Needs (11/12/2022)   Received from [facility] - Transportation    Lack of Transportation (Medical): No    Lack of Transportation (Non-Medical): No  Physical Activity: Sufficiently Active (05/02/2022)   Received from [facility], [facility]   Exercise Vital Sign    Days of Exercise per Week: 6 days    Minutes of Exercise per Session: 60 min  Stress: No Stress Concern Present (05/02/2022)   Received from [facility], [facility] of Occupational [facility] - Occupational Stress Questionnaire    Feeling of Stress : Not at all  Social Connections: Socially Integrated (05/02/2022)   Received from [facility], [facility]   Social Network    How would you rate your social network (family,  work, friends)?: Good participation with social networks  Intimate Partner Violence: Not At Risk (05/02/2022)  Received from [facility], [facility]   HITS    Over the last 12 months how often did your partner physically hurt you?: 1    Over the last 12 months how often did your partner insult you or talk down to you?: 1    Over the last 12 months how often did your partner threaten you with physical harm?: 1    Over the last 12 months how often did your partner scream or curse at you?: 1    Family History  Problem Relation Age of Onset   Heart failure Father    Heart attack Father        Deceased    Thyroid disease Sister     Anti-infectives: Anti-infectives (From admission, onward)    None       Current Outpatient Medications  Medication Sig Dispense Refill   acetaminophen (TYLENOL) 325 MG tablet Take 2 tablets (650 mg total) by mouth every 6 (six) hours as needed for headache. 30 tablet 1   ELIQUIS 5 MG TABS tablet Take 1 tablet (5 mg total) by mouth 2 (two) times daily. Resume from may 9 60 tablet    EQUATE STOOL SOFTENER 100 MG capsule Take 2 capsules by mouth twice daily 120 capsule 0   FEROSUL 325 (65 Fe) MG tablet Take 1 tablet by mouth once daily with breakfast 30 tablet 0   fludrocortisone (FLORINEF) 0.1 MG tablet TAKE ONE TABLET ONCE DAILY WITH BREAKFAST 90 tablet 1   furosemide (LASIX) 40 MG tablet Take 1 tablet (40 mg total) by mouth daily. 7 tablet 0   levocetirizine (XYZAL) 5 MG tablet Take 5 mg by mouth every evening.     levothyroxine (SYNTHROID) 100 MCG tablet TAKE ONE TABLET ONCE DAILY BEFORE BREAKFAST 90 tablet 1   magnesium oxide (MAG-OX) 400 (240 Mg) MG tablet Take 400 mg by mouth daily.     omeprazole (PRILOSEC) 10 MG capsule Take 20 mg by mouth daily.     potassium chloride (KLOR-CON) 10 MEQ tablet TAKE TWO TABLETS TWICE DAILY 360 tablet 1   predniSONE (DELTASONE) 10 MG tablet TAKE ONE TABLET ONCE DAILY WITH BREAKFAST 90 tablet 1   pregabalin  (LYRICA) 150 MG capsule Take 150 mg by mouth 2 (two) times daily.     rosuvastatin (CRESTOR) 5 MG tablet Take 1 tablet by mouth at bedtime.     valACYclovir (VALTREX) 1000 MG tablet Take 1,000 mg by mouth daily.     vitamin B-12 (CYANOCOBALAMIN) 1000 MCG tablet Take 1 tablet (1,000 mcg total) by mouth daily. 30 tablet 3   alfuzosin (UROXATRAL) 10 MG 24 hr tablet TAKE ONE TABLET DAILY WITH BREAKFAST 90 tablet 3   No current facility-administered medications for this visit.     Objective: Vital signs in last 24 hours: BP (!) 143/95   Pulse 89   Ht 6' (1.829 m)   Wt (!) 301 lb 12.8 oz (136.9 kg)   BMI 40.93 kg/m   Intake/Output from previous day: No intake/output data recorded. Intake/Output this shift: @IOTHISSHIFT @   Physical Exam  Lab Results:  Results for orders placed or performed in visit on 01/03/23 (from the past 24 hour(s))  Urinalysis, Routine w reflex microscopic     Status: None   Collection Time: 01/03/23  2:59 PM  Result Value Ref Range   Specific Gravity, UA 1.015 1.005 - 1.030   pH, UA 7.5 5.0 - 7.5   Color, UA Yellow Yellow   Appearance Ur Clear Clear  Leukocytes,UA Negative Negative   Protein,UA Negative Negative/Trace   Glucose, UA Negative Negative   Ketones, UA Negative Negative   RBC, UA Negative Negative   Bilirubin, UA Negative Negative   Urobilinogen, Ur 1.0 0.2 - 1.0 mg/dL   Nitrite, UA Negative Negative   Microscopic Examination Comment    Narrative   Performed at:  [address] [facility] [address], [place], [place]  [id number] Lab Director: Chinita Pester MT, Phone:  [phone]       Recent Results (from the past 2160 hour(s))  HgB A1c     Status: None   Collection Time: 10/11/22  9:59 AM  Result Value Ref Range   Hemoglobin A1C     HbA1c POC (<> result, manual entry)     HbA1c, POC (prediabetic range)     HbA1c, POC (controlled diabetic range) 5.6 0.0 - 7.0 %  Lactate dehydrogenase     Status: None   Collection Time:  11/20/22  2:12 PM  Result Value Ref Range   LDH 173 98 - 192 U/L    Comment: Performed at [facility], [address], [place], [place] [address]  Comprehensive metabolic panel     Status: Abnormal   Collection Time: 11/20/22  2:12 PM  Result Value Ref Range   Sodium 140 135 - 145 mmol/L   Potassium 4.1 3.5 - 5.1 mmol/L   Chloride 97 (L) 98 - 111 mmol/L   CO2 32 22 - 32 mmol/L   Glucose, Bld 123 (H) 70 - 99 mg/dL    Comment: Glucose reference range applies only to samples taken after fasting for at least 8 hours.   BUN 16 8 - 23 mg/dL   Creatinine, Ser 6.38 0.61 - 1.24 mg/dL   Calcium 9.8 8.9 - 75.6 mg/dL   Total Protein 7.1 6.5 - 8.1 g/dL   Albumin 4.0 3.5 - 5.0 g/dL   AST 25 15 - 41 U/L   ALT 19 0 - 44 U/L   Alkaline Phosphatase 61 38 - 126 U/L   Total Bilirubin 2.4 (H) 0.3 - 1.2 mg/dL   GFR, Estimated >43 >32 mL/min    Comment: (NOTE) Calculated using the CKD-EPI Creatinine Equation (2021)    Anion gap 11 5 - 15    Comment: Performed at [facility], [address], [place], [place] [address]  CBC with Differential/Platelet     Status: Abnormal   Collection Time: 11/20/22  2:12 PM  Result Value Ref Range   WBC 11.9 (H) 4.0 - 10.5 K/uL   RBC 5.59 4.22 - 5.81 MIL/uL   Hemoglobin 17.0 13.0 - 17.0 g/dL   HCT 41.6 60.6 - 30.1 %   MCV 92.3 80.0 - 100.0 fL   MCH 30.4 26.0 - 34.0 pg   MCHC 32.9 30.0 - 36.0 g/dL   RDW 60.1 (H) 09.3 - 23.5 %   Platelets 190 150 - 400 K/uL   nRBC 0.0 0.0 - 0.2 %   Neutrophils Relative % 74 %   Neutro Abs 8.9 (H) 1.7 - 7.7 K/uL   Lymphocytes Relative 21 %   Lymphs Abs 2.5 0.7 - 4.0 K/uL   Monocytes Relative 3 %   Monocytes Absolute 0.4 0.1 - 1.0 K/uL   Eosinophils Relative 1 %   Eosinophils Absolute 0.1 0.0 - 0.5 K/uL   Basophils Relative 0 %   Basophils Absolute 0.1 0.0 - 0.1 K/uL   Immature Granulocytes 1 %   Abs Immature Granulocytes 0.06 0.00 - 0.07  K/uL    Comment: Performed at [facility], [address], [place], [place] [address]   CUP PACEART REMOTE DEVICE CHECK     Status: None   Collection Time: 12/09/22  7:26 PM  Result Value Ref Range   Date Time Interrogation Session [id number]    Pulse Generator Manufacturer MERM    Pulse Gen Model [id number] Azure XT DR MRI    Pulse Gen Serial Number [id number]    Clinic Name [facility]    Implantable Pulse Generator Type Implantable Pulse Generator    Implantable Pulse Generator Implant Date [id number]    Implantable Lead Manufacturer MERM    Implantable Lead Model 3830 SelectSecure MRI SureScan    Implantable Lead Serial Number [id number]    Implantable Lead Implant Date 62952841    Implantable Lead Location Detail 1 UNKNOWN    Implantable Lead Special Function LBBB    Implantable Lead Location [id number]    Implantable Lead Connection Status [id number]    Implantable Lead Manufacturer MERM    Implantable Lead Model 5076 CapSureFix Novus MRI SureScan    Implantable Lead Serial Number [id number]    Implantable Lead Implant Date 32440102    Implantable Lead Location Detail 1 APPENDAGE    Implantable Lead Location [id number]    Implantable Lead Connection Status L088196    Lead Channel Setting Sensing Sensitivity 2.8 mV   Lead Channel Setting Pacing Amplitude 3.5 V   Lead Channel Setting Pacing Pulse Width 0.4 ms   Lead Channel Setting Pacing Amplitude 2 V   Zone Setting Status 755011    Zone Setting Status [phone]    Lead Channel Impedance Value 608 ohm   Lead Channel Impedance Value 380 ohm   Lead Channel Sensing Intrinsic Amplitude 2.125 mV   Lead Channel Sensing Intrinsic Amplitude 2.125 mV   Lead Channel Impedance Value 589 ohm   Lead Channel Impedance Value 418 ohm   Lead Channel Sensing Intrinsic Amplitude 10.625 mV   Lead Channel Sensing Intrinsic Amplitude 10.625 mV   Lead Channel Pacing Threshold Amplitude 0.5 V   Lead Channel Pacing Threshold Pulse Width 0.4 ms   Battery Status OK    Battery Remaining Longevity 158 mo   Battery Voltage 3.18 V   Brady  Statistic RA Percent Paced 0 %   Brady Statistic RV Percent Paced 98.32 %  Urinalysis, Routine w reflex microscopic     Status: None   Collection Time: 01/03/23  2:59 PM  Result Value Ref Range   Specific Gravity, UA 1.015 1.005 - 1.030   pH, UA 7.5 5.0 - 7.5   Color, UA Yellow Yellow   Appearance Ur Clear Clear   Leukocytes,UA Negative Negative   Protein,UA Negative Negative/Trace   Glucose, UA Negative Negative   Ketones, UA Negative Negative   RBC, UA Negative Negative   Bilirubin, UA Negative Negative   Urobilinogen, Ur 1.0 0.2 - 1.0 mg/dL   Nitrite, UA Negative Negative   Microscopic Examination Comment     Comment: Microscopic not indicated and not performed.  BLADDER SCAN AMB NON-IMAGING     Status: None   Collection Time: 01/03/23  2:59 PM  Result Value Ref Range   Scan Result 0     BMET No results for input(s): "NA", "K", "CL", "CO2", "GLUCOSE", "BUN", "CREATININE", "CALCIUM" in the last 72 hours. PT/INR No results for input(s): "LABPROT", "INR" in the last 72 hours. ABG No results for input(s): "PHART", "HCO3" in the last 72 hours.  Invalid input(s): "PCO2", "PO2"  UA is clear.. Studies/Results: CUP PACEART REMOTE DEVICE CHECK  Result Date: 12/10/2022 Scheduled remote reviewed. Normal device function.  Presenting rhythm AF, total AF burden 100%.  Known history of AF, on Eliquis. Next remote 91 days. - CS, CVRS     Assessment/Plan: BPH with UUI.   He is emptying well on alfuzosin but has increased UUI and has failed prior meds.  He has a pacemaker so he can't have neuromodulation.   I will get urodynamics to see if he might need an outlet procedure or botox.   History of UTI's.  HIs urine is clear today. .   Meds ordered this encounter  Medications   alfuzosin (UROXATRAL) 10 MG 24 hr tablet    Sig: TAKE ONE TABLET DAILY WITH BREAKFAST    Dispense:  90 tablet    Refill:  3     Orders Placed This Encounter  Procedures   Urinalysis, Routine w reflex  microscopic   Ambulatory referral to Urology    Referral Priority:   Urgent    Referral Type:   Consultation    Referral Reason:   Specialty Services Required    Referred to Provider:   Bjorn Pippin, MD    Requested Specialty:   Urology    Number of Visits Requested:   1   BLADDER SCAN AMB NON-IMAGING     Return for Next available with urodynamic results. .    CC: Sharon Seller NP.      Bjorn Pippin 01/04/2023 [phone] ID: Cedric Fishman, male   DOB: 18-Oct-1950, 72 y.o.   MRN: [id number] Patient ID: NAASON CUFFEE, male   DOB: 06-14-1950, 73 y.o.   MRN: [id number] Patient ID: NAYEL WARNCKE, male   DOB: 07-10-1950, 72 y.o.   MRN: [id number]

## 2023-01-03 NOTE — Progress Notes (Signed)
post void residual = 0 ml

## 2023-01-08 ENCOUNTER — Ambulatory Visit (HOSPITAL_COMMUNITY): Admission: RE | Admit: 2023-01-08 | Payer: 59 | Source: Ambulatory Visit

## 2023-01-14 ENCOUNTER — Telehealth: Payer: Self-pay | Admitting: *Deleted

## 2023-01-14 NOTE — Telephone Encounter (Signed)
Followed up w/ pt per MD request. Pt reports improvement since taking the Lasix for a week. SOB improved. Pt appreciates the follow up

## 2023-01-24 ENCOUNTER — Encounter (HOSPITAL_COMMUNITY): Payer: Self-pay

## 2023-01-24 ENCOUNTER — Ambulatory Visit (HOSPITAL_COMMUNITY): Admission: RE | Admit: 2023-01-24 | Payer: 59 | Source: Ambulatory Visit

## 2023-01-31 ENCOUNTER — Encounter: Payer: Self-pay | Admitting: Urology

## 2023-01-31 ENCOUNTER — Ambulatory Visit: Payer: 59 | Admitting: Urology

## 2023-01-31 VITALS — BP 127/84 | HR 86 | Ht 72.0 in | Wt 301.8 lb

## 2023-01-31 DIAGNOSIS — N3941 Urge incontinence: Secondary | ICD-10-CM | POA: Diagnosis not present

## 2023-01-31 DIAGNOSIS — N400 Enlarged prostate without lower urinary tract symptoms: Secondary | ICD-10-CM

## 2023-01-31 DIAGNOSIS — N401 Enlarged prostate with lower urinary tract symptoms: Secondary | ICD-10-CM | POA: Diagnosis not present

## 2023-01-31 DIAGNOSIS — Z87440 Personal history of urinary (tract) infections: Secondary | ICD-10-CM

## 2023-01-31 DIAGNOSIS — R350 Frequency of micturition: Secondary | ICD-10-CM

## 2023-01-31 DIAGNOSIS — N138 Other obstructive and reflux uropathy: Secondary | ICD-10-CM

## 2023-01-31 DIAGNOSIS — R339 Retention of urine, unspecified: Secondary | ICD-10-CM

## 2023-01-31 LAB — MICROSCOPIC EXAMINATION
Bacteria, UA: NONE SEEN
Epithelial Cells (non renal): NONE SEEN /hpf (ref 0–10)
RBC, Urine: NONE SEEN /hpf (ref 0–2)

## 2023-01-31 LAB — URINALYSIS, ROUTINE W REFLEX MICROSCOPIC
Bilirubin, UA: NEGATIVE
Glucose, UA: NEGATIVE
Ketones, UA: NEGATIVE
Nitrite, UA: NEGATIVE
Protein,UA: NEGATIVE
RBC, UA: NEGATIVE
Specific Gravity, UA: <1.005 — ABNORMAL LOW (ref 1.005–1.030)
Urobilinogen, Ur: 1 mg/dL (ref 0.2–1.0)
pH, UA: 6 (ref 5.0–7.5)

## 2023-01-31 MED ORDER — MIRABEGRON ER 25 MG PO TB24
ORAL_TABLET | ORAL | 11 refills | Status: DC
Start: 2023-01-31 — End: 2023-05-09

## 2023-01-31 NOTE — Progress Notes (Signed)
Subjective: 1. Urge incontinence of urine   2. Benign prostatic hyperplasia, unspecified whether lower urinary tract symptoms present   3. Urinary frequency   4. Incomplete bladder emptying   5. Personal history of urinary infection      01/31/23: Azure returns today in f/u from [facility].   He was found to have a capacity with instability.  He had a voiding pressure of 37cm with a PF of 87ml/sec and a PVR of .  His prostate volume is about 25ml on measurements from a CT in 3/24. His IPSS is 18 with nocturia x 2 and urgency and frequency.  He remains on alfuzosin daily but is not sure it is helping.   He has a 1 month history of knot in the posterior right thigh.  He is constipated with a BM about every 4 days.  His UA is clear. Today.    01/03/23: Harvel returns today in f/u.  He has a history of BPH with BOO with UUI and UTI's.  He has increased frequency since starting furosemide 40mg  daily for a week.  He stopped it Weds.  The frequency is improving.  He continues to have UUI which has worsened since his last visit.  He has no dysuria or hematuria.  He has gained weight on prednisone, 60lb in 3 months.  His PVR is 0ml.  He remains on alfuzosin.  His UA is clear today.     07/12/22: Macguire returns today in f/u.  His culture finally came back today with 2 bacteria including E. Coli and Klebsiella.  He was switched to [facility] in the ER on Friday night when he went with dehydration.  He is feeling much better with reduced frequency and urgency.  His UA is unremarkable today.  In the ER his cr was 1.34 and his WBC was 14.6.  THe UA had 11-20 WBC. A CT was done that showed some bladder changes suggestive of cystitis.   07/05/22: Alana returns today with the onset last night of cloudy urine with frequency, urgency and small dribbling voids.  He had no fever.  He remains on alfuzosin for BPH with BOO and his PVR is 0ml.  His UA looks infected today.  He has had no gross hematuria.   CMP and CBC were  unremarkable on 06/21/21 apart from a mild leukocytosis and a T.B. of 2.4.   02/01/22: Zaragoza returns today in f/u.  He is on Uroxatrol and off of Gemtesa since his last visit.  He is voiding better.  He has urgency and can have some accidents if not careful.  He can have some terminal intermittency. He has rare nocturia.   His PVR is down to 10ml from over .  His UA is clear.      11/30/21: Jeffie returns today in f/u for cystoscopy.  He had blurry vision and dizziness with trospium and stopped it after 10 days.  His nocturia is down to 1x.  He has daytime frequency q1hr.  He was told to drink 6 bottles of water daily.  He can avoid the UUI.  His PVR is 0ml.    10/19/21: Matthey is a 72 yo male who is sent for BPH with BOO and UUI.  He had a bout of food poisoning about 3 weeks ago with N/V and diarrhea but the UUI has been going on for 3-4 months.  He will leak every time he makes a step.  He is not wearing pads.  He has  no hematuria or dysuria.  He has nocturia x 3 with UUI.  He has daytime frequency 10x with UUI as well.  He has no SUI.  He has a reduced stream and doesn't feel empty.  He had a CT AP on 09/27/21 and had no GU abnormalities. His bladder was not distended and his prostate wasn't too large.   He has had no UTI's or Gu surgery.  His UA is clear today.  He is on tamsulosin but that hasn't helped.  He had labs on 09/27/21 and had normal renal function.  His Bili was 2.1 with an ALT of 47 and some changes suggestive of cirrhosis on the CT.   He had a UA with many bacteria in 3/23 with Mx species on the culture but a negative UA on 09/27/21.  I don't see a PSA in his labs.    IPSS     Row Name 01/31/23 1000         International Prostate Symptom Score   How often have you had the sensation of not emptying your bladder? Not at All     How often have you had to urinate less than every two hours? More than half the time     How often have you found you stopped and started again several times  when you urinated? About half the time     How often have you found it difficult to postpone urination? About half the time     How often have you had a weak urinary stream? More than half the time     How often have you had to strain to start urination? Less than half the time     How many times did you typically get up at night to urinate? 2 Times     Total IPSS Score 18       Quality of Life due to urinary symptoms   If you were to spend the rest of your life with your urinary condition just the way it is now how would you feel about that? Unhappy              IPSS     Row Name 01/31/23 1000         International Prostate Symptom Score   How often have you had the sensation of not emptying your bladder? Not at All     How often have you had to urinate less than every two hours? More than half the time     How often have you found you stopped and started again several times when you urinated? About half the time     How often have you found it difficult to postpone urination? About half the time     How often have you had a weak urinary stream? More than half the time     How often have you had to strain to start urination? Less than half the time     How many times did you typically get up at night to urinate? 2 Times     Total IPSS Score 18       Quality of Life due to urinary symptoms   If you were to spend the rest of your life with your urinary condition just the way it is now how would you feel about that? Unhappy                ROS:  Review of Systems  HENT:  Positive for  congestion.   Neurological:  Positive for headaches.    Allergies  Allergen Reactions   Adhesive [Tape]     Contact dermatitis   Sulfa Antibiotics     UNKNOWN Childhood reaction    Past Medical History:  Diagnosis Date   Atrial fibrillation (HCC)    Cancer (HCC)    Diffuse Large B-Cell lymphoma   GERD (gastroesophageal reflux disease)    High cholesterol    Hypertension     Obesity     Past Surgical History:  Procedure Laterality Date   APPENDECTOMY     CATARACT EXTRACTION Bilateral 2022   CHOLECYSTECTOMY     ESOPHAGEAL DILATION     multiple times   IR IMAGING GUIDED PORT INSERTION  03/31/2021   JOINT REPLACEMENT Left    hip   PACEMAKER IMPLANT N/A 09/07/2022   Procedure: PACEMAKER IMPLANT;  Surgeon: Regan Lemming, MD;  Location: [facility];  Service: Cardiovascular;  Laterality: N/A;    Social History   Socioeconomic History   Marital status: Widowed    Spouse name: Not on file   Number of children: 3   Years of education: Not on file   Highest education level: Not on file  Occupational History   Occupation: Retired  Tobacco Use   Smoking status: Never    Passive exposure: Never   Smokeless tobacco: Never  Vaping Use   Vaping status: Never Used  Substance and Sexual Activity   Alcohol use: No   Drug use: No   Sexual activity: Not Currently  Other Topics Concern   Not on file  Social History Narrative   Not on file   Social Determinants of Health   Financial Resource Strain: Low Risk  (11/12/2022)   Received from [facility]   Overall Financial Resource Strain (CARDIA)    Difficulty of Paying Living Expenses: Not hard at all  Food Insecurity: Low Risk  (11/30/2022)   Received from [facility]   Hunger Vital Sign    Worried About Running Out of Food in the Last Year: Never true    Ran Out of Food in the Last Year: Never true  Transportation Needs: No Transportation Needs (11/12/2022)   Received from [facility] - Transportation    Lack of Transportation (Medical): No    Lack of Transportation (Non-Medical): No  Physical Activity: Sufficiently Active (05/02/2022)   Received from [facility]   Exercise Vital Sign    Days of Exercise per Week: 6 days    Minutes of Exercise per Session: 60 min  Stress: No Stress Concern Present (05/02/2022)   Received from [facility] - Occupational Stress Questionnaire    Feeling of Stress : Not at all  Social Connections: Socially Integrated (05/02/2022)   Received from [facility]   Social Network    How would you rate your social network (family, work, friends)?: Good participation with social networks  Intimate Partner Violence: Not At Risk (05/02/2022)   Received from [facility]   HITS    Over the last 12 months how often did your partner physically hurt you?: 1    Over the last 12 months how often did your partner insult you or talk down to you?: 1    Over the last 12 months how often did your partner threaten you with physical harm?: 1    Over the last 12 months  how often did your partner scream or curse at you?: 1    Family History  Problem Relation Age of Onset   Heart failure Father    Heart attack Father        Deceased    Thyroid disease Sister     Anti-infectives: Anti-infectives (From admission, onward)    None       Current Outpatient Medications  Medication Sig Dispense Refill   acetaminophen (TYLENOL) 325 MG tablet Take 2 tablets (650 mg total) by mouth every 6 (six) hours as needed for headache. 30 tablet 1   alfuzosin (UROXATRAL) 10 MG 24 hr tablet TAKE ONE TABLET DAILY WITH BREAKFAST 90 tablet 3   ELIQUIS 5 MG TABS tablet Take 1 tablet (5 mg total) by mouth 2 (two) times daily. Resume from may 9 60 tablet    EQUATE STOOL SOFTENER 100 MG capsule Take 2 capsules by mouth twice daily 120 capsule 0   FEROSUL 325 (65 Fe) MG tablet Take 1 tablet by mouth once daily with breakfast 30 tablet 0   fludrocortisone (FLORINEF) 0.1 MG tablet TAKE ONE TABLET ONCE DAILY WITH BREAKFAST 90 tablet 1   furosemide (LASIX) 40 MG tablet Take 1 tablet (40 mg total) by mouth daily. 7 tablet 0   levocetirizine (XYZAL) 5 MG tablet Take 5 mg by mouth every evening.     levothyroxine (SYNTHROID) 100 MCG tablet TAKE ONE TABLET ONCE DAILY  BEFORE BREAKFAST 90 tablet 1   magnesium oxide (MAG-OX) 400 (240 Mg) MG tablet Take 400 mg by mouth daily.     mirabegron ER (MYRBETRIQ) 25 MG TB24 tablet 1-2 po qday.  Start with 1 and increase as needed. 60 tablet 11   omeprazole (PRILOSEC) 10 MG capsule Take 20 mg by mouth daily.     potassium chloride (KLOR-CON) 10 MEQ tablet TAKE TWO TABLETS TWICE DAILY 360 tablet 1   predniSONE (DELTASONE) 10 MG tablet TAKE ONE TABLET ONCE DAILY WITH BREAKFAST 90 tablet 1   pregabalin (LYRICA) 150 MG capsule Take 150 mg by mouth 2 (two) times daily.     rosuvastatin (CRESTOR) 5 MG tablet Take 1 tablet by mouth at bedtime.     valACYclovir (VALTREX) 1000 MG tablet Take 1,000 mg by mouth daily.     vitamin B-12 (CYANOCOBALAMIN) 1000 MCG tablet Take 1 tablet (1,000 mcg total) by mouth daily. 30 tablet 3   No current facility-administered medications for this visit.     Objective: Vital signs in last 24 hours: BP 127/84   Pulse 86   Ht 6' (1.829 m)   Wt (!) 301 lb 12.8 oz (136.9 kg)   BMI 40.93 kg/m   Intake/Output from previous day: No intake/output data recorded. Intake/Output this shift: @IOTHISSHIFT @   Physical Exam Musculoskeletal:     Comments: I don't feel any abnormality on the right inner thigh other than fatty tissue.      Lab Results:  No results found for this or any previous visit (from the past 24 hour(s)).      Recent Results (from the past 2160 hour(s))  Lactate dehydrogenase     Status: None   Collection Time: 11/20/22  2:12 PM  Result Value Ref Range   LDH 173 98 - 192 U/L    Comment: Performed at [facility], [address] [place], [place] [address]  Comprehensive metabolic panel     Status: Abnormal   Collection Time: 11/20/22  2:12 PM  Result Value Ref Range   Sodium  140 135 - 145 mmol/L   Potassium 4.1 3.5 - 5.1 mmol/L   Chloride 97 (L) 98 - 111 mmol/L   CO2 32 22 - 32 mmol/L   Glucose, Bld 123 (H) 70 - 99 mg/dL    Comment: Glucose reference range  applies only to samples taken after fasting for at least 8 hours.   BUN 16 8 - 23 mg/dL   Creatinine, Ser 1.30 0.61 - 1.24 mg/dL   Calcium 9.8 8.9 - 86.5 mg/dL   Total Protein 7.1 6.5 - 8.1 g/dL   Albumin 4.0 3.5 - 5.0 g/dL   AST 25 15 - 41 U/L   ALT 19 0 - 44 U/L   Alkaline Phosphatase 61 38 - 126 U/L   Total Bilirubin 2.4 (H) 0.3 - 1.2 mg/dL   GFR, Estimated >78 >46 mL/min    Comment: (NOTE) Calculated using the CKD-EPI Creatinine Equation (2021)    Anion gap 11 5 - 15    Comment: Performed at [facility], [address] [place], [place] [address]  CBC with Differential/Platelet     Status: Abnormal   Collection Time: 11/20/22  2:12 PM  Result Value Ref Range   WBC 11.9 (H) 4.0 - 10.5 K/uL   RBC 5.59 4.22 - 5.81 MIL/uL   Hemoglobin 17.0 13.0 - 17.0 g/dL   HCT 28.4 13.2 - 44.0 %   MCV 92.3 80.0 - 100.0 fL   MCH 30.4 26.0 - 34.0 pg   MCHC 32.9 30.0 - 36.0 g/dL   RDW 10.2 (H) 72.5 - 36.6 %   Platelets 190 150 - 400 K/uL   nRBC 0.0 0.0 - 0.2 %   Neutrophils Relative % 74 %   Neutro Abs 8.9 (H) 1.7 - 7.7 K/uL   Lymphocytes Relative 21 %   Lymphs Abs 2.5 0.7 - 4.0 K/uL   Monocytes Relative 3 %   Monocytes Absolute 0.4 0.1 - 1.0 K/uL   Eosinophils Relative 1 %   Eosinophils Absolute 0.1 0.0 - 0.5 K/uL   Basophils Relative 0 %   Basophils Absolute 0.1 0.0 - 0.1 K/uL   Immature Granulocytes 1 %   Abs Immature Granulocytes 0.06 0.00 - 0.07 K/uL    Comment: Performed at [facility] [place], [address] [place], [place] [address]  CUP PACEART REMOTE DEVICE CHECK     Status: None   Collection Time: 12/09/22  7:26 PM  Result Value Ref Range   Date Time Interrogation Session [id number]    Pulse Generator Manufacturer MERM    Pulse Gen Model [id number] Azure XT DR MRI    Pulse Gen Serial Number [id number]    Clinic Name [facility]    Implantable Pulse Generator Type Implantable Pulse Generator    Implantable Pulse Generator Implant Date 66063016    Implantable Lead Manufacturer  MERM    Implantable Lead Model 3830 SelectSecure MRI SureScan    Implantable Lead Serial Number [id number]    Implantable Lead Implant Date 01093235    Implantable Lead Location Detail 1 UNKNOWN    Implantable Lead Special Function LBBB    Implantable Lead Location [id number]    Implantable Lead Connection Status [id number]    Implantable Lead Manufacturer MERM    Implantable Lead Model 5076 CapSureFix Novus MRI SureScan    Implantable Lead Serial Number [id number]    Implantable Lead Implant Date 57322025    Implantable Lead Location Detail 1 APPENDAGE    Implantable Lead Location [id number]    Implantable Lead Connection Status  [id number]    Lead Channel Setting Sensing Sensitivity 2.8 mV   Lead Channel Setting Pacing Amplitude 3.5 V   Lead Channel Setting Pacing Pulse Width 0.4 ms   Lead Channel Setting Pacing Amplitude 2 V   Zone Setting Status 755011    Zone Setting Status [phone]    Lead Channel Impedance Value 608 ohm   Lead Channel Impedance Value 380 ohm   Lead Channel Sensing Intrinsic Amplitude 2.125 mV   Lead Channel Sensing Intrinsic Amplitude 2.125 mV   Lead Channel Impedance Value 589 ohm   Lead Channel Impedance Value 418 ohm   Lead Channel Sensing Intrinsic Amplitude 10.625 mV   Lead Channel Sensing Intrinsic Amplitude 10.625 mV   Lead Channel Pacing Threshold Amplitude 0.5 V   Lead Channel Pacing Threshold Pulse Width 0.4 ms   Battery Status OK    Battery Remaining Longevity 158 mo   Battery Voltage 3.18 V   Brady Statistic RA Percent Paced 0 %   Brady Statistic RV Percent Paced 98.32 %  Urinalysis, Routine w reflex microscopic     Status: None   Collection Time: 01/03/23  2:59 PM  Result Value Ref Range   Specific Gravity, UA 1.015 1.005 - 1.030   pH, UA 7.5 5.0 - 7.5   Color, UA Yellow Yellow   Appearance Ur Clear Clear   Leukocytes,UA Negative Negative   Protein,UA Negative Negative/Trace   Glucose, UA Negative Negative   Ketones, UA Negative Negative   RBC, UA  Negative Negative   Bilirubin, UA Negative Negative   Urobilinogen, Ur 1.0 0.2 - 1.0 mg/dL   Nitrite, UA Negative Negative   Microscopic Examination Comment     Comment: Microscopic not indicated and not performed.  BLADDER SCAN AMB NON-IMAGING     Status: None   Collection Time: 01/03/23  2:59 PM  Result Value Ref Range   Scan Result 0   Urinalysis, Routine w reflex microscopic     Status: Abnormal   Collection Time: 01/31/23  9:54 AM  Result Value Ref Range   Specific Gravity, UA <1.005 (L) 1.005 - 1.030   pH, UA 6.0 5.0 - 7.5   Color, UA Yellow Yellow   Appearance Ur Clear Clear   Leukocytes,UA Trace (A) Negative   Protein,UA Negative Negative/Trace   Glucose, UA Negative Negative   Ketones, UA Negative Negative   RBC, UA Negative Negative   Bilirubin, UA Negative Negative   Urobilinogen, Ur 1.0 0.2 - 1.0 mg/dL   Nitrite, UA Negative Negative   Microscopic Examination See below:     Comment: Microscopic was indicated and was performed.  Microscopic Examination     Status: None   Collection Time: 01/31/23  9:54 AM   Urine  Result Value Ref Range   WBC, UA 0-5 0 - 5 /hpf   RBC, Urine None seen 0 - 2 /hpf   Epithelial Cells (non renal) None seen 0 - 10 /hpf   Bacteria, UA None seen None seen/Few    BMET No results for input(s): "NA", "K", "CL", "CO2", "GLUCOSE", "BUN", "CREATININE", "CALCIUM" in the last 72 hours. PT/INR No results for input(s): "LABPROT", "INR" in the last 72 hours. ABG No results for input(s): "PHART", "HCO3" in the last 72 hours.  Invalid input(s): "PCO2", "PO2" UA is clear.. Studies/Results: CUP PACEART REMOTE DEVICE CHECK  Result Date: 12/10/2022 Scheduled remote reviewed. Normal device function.  Presenting rhythm AF, total AF burden 100%.  Known history of AF, on Eliquis. Next remote 91  days. - CS, CVRS   [facility] reviewed.   Assessment/Plan: BPH with UUI.   He is emptying well on alfuzosin but has increased UUI.  He has DI with BOO on  UDS.   I discussed options including a TURP/TUIP which is probably the best next step but he is reluctant to pursue that.  I will try Myrbetriq 25-50mg .  Side effects reviewed.  He has severe chronic constipation so I don't think an anticholinergic would be wise.    History of UTI's.  HIs urine is clear today. .   Meds ordered this encounter  Medications   mirabegron ER (MYRBETRIQ) 25 MG TB24 tablet    Sig: 1-2 po qday.  Start with 1 and increase as needed.    Dispense:  60 tablet    Refill:  11    He has severe constipation and isn't a good candidate for his formulary options.     Orders Placed This Encounter  Procedures   Microscopic Examination   Urinalysis, Routine w reflex microscopic     Return in about 3 months (around 05/02/2023) for with PVR.    CC: Sharon Seller NP.      Bjorn Pippin 02/02/2023 [phone] ID: Cedric Fishman, male   DOB: 1951-02-26, 72 y.o.   MRN: [id number] Patient ID: CJAY HELMES, male   DOB: 1951/02/05, 72 y.o.   MRN: [id number] Patient ID: TYLIQUE CIRILLO, male   DOB: [birth year], 72 y.o.   MRN: [id number]

## 2023-02-12 ENCOUNTER — Telehealth: Payer: Self-pay | Admitting: Urology

## 2023-02-12 NOTE — Telephone Encounter (Signed)
  We could try Gemtesa 75mg  daily po, #30 with refills.  He could get samples to start.  Tried calling patient with no answer, left vm for return call

## 2023-02-12 NOTE — Telephone Encounter (Signed)
Patient called this medication mirabegron ER (MYRBETRIQ) 25 MG TB24 tablet [[id number]]  is elevating his blood pressure and his PCP took him off of it. Is there something else he can take?   He resumed taking the alfuzosin (UROXATRAL) 10 MG 24 hr tablet [[id number]]

## 2023-02-28 ENCOUNTER — Ambulatory Visit: Payer: 59 | Admitting: Urology

## 2023-03-11 ENCOUNTER — Ambulatory Visit (INDEPENDENT_AMBULATORY_CARE_PROVIDER_SITE_OTHER): Payer: 59

## 2023-03-11 DIAGNOSIS — I442 Atrioventricular block, complete: Secondary | ICD-10-CM | POA: Diagnosis not present

## 2023-03-11 LAB — CUP PACEART REMOTE DEVICE CHECK
Battery Remaining Longevity: 155 mo
Battery Voltage: 3.14 V
Brady Statistic RA Percent Paced: 0 %
Brady Statistic RV Percent Paced: 90.8 %
Date Time Interrogation Session: 20241104032540
Implantable Lead Connection Status: 753985
Implantable Lead Connection Status: 753985
Implantable Lead Implant Date: 20240503
Implantable Lead Implant Date: 20240503
Implantable Lead Location: 753859
Implantable Lead Location: 753860
Implantable Lead Model: 3830
Implantable Lead Model: 5076
Implantable Pulse Generator Implant Date: 20240503
Lead Channel Impedance Value: 380 Ohm
Lead Channel Impedance Value: 418 Ohm
Lead Channel Impedance Value: 551 Ohm
Lead Channel Impedance Value: 570 Ohm
Lead Channel Pacing Threshold Amplitude: 0.75 V
Lead Channel Pacing Threshold Pulse Width: 0.4 ms
Lead Channel Sensing Intrinsic Amplitude: 13.75 mV
Lead Channel Sensing Intrinsic Amplitude: 13.75 mV
Lead Channel Sensing Intrinsic Amplitude: 2.625 mV
Lead Channel Sensing Intrinsic Amplitude: 2.625 mV
Lead Channel Setting Pacing Amplitude: 2 V
Lead Channel Setting Pacing Pulse Width: 0.4 ms
Lead Channel Setting Sensing Sensitivity: 2.8 mV
Zone Setting Status: 755011
Zone Setting Status: 755011

## 2023-03-15 NOTE — Telephone Encounter (Signed)
Samples were not picked up , they were put back. 03/15/23

## 2023-03-17 ENCOUNTER — Emergency Department (HOSPITAL_COMMUNITY): Payer: 59

## 2023-03-17 ENCOUNTER — Other Ambulatory Visit: Payer: Self-pay

## 2023-03-17 ENCOUNTER — Emergency Department (HOSPITAL_COMMUNITY)
Admission: EM | Admit: 2023-03-17 | Discharge: 2023-03-17 | Disposition: A | Discharge status: Home / Self Care | Payer: 59 | Attending: Emergency Medicine | Admitting: Emergency Medicine

## 2023-03-17 ENCOUNTER — Encounter (HOSPITAL_COMMUNITY): Payer: Self-pay

## 2023-03-17 DIAGNOSIS — R000 Tachycardia, unspecified: Secondary | ICD-10-CM | POA: Insufficient documentation

## 2023-03-17 DIAGNOSIS — Z20822 Contact with and (suspected) exposure to covid-19: Secondary | ICD-10-CM | POA: Insufficient documentation

## 2023-03-17 DIAGNOSIS — J209 Acute bronchitis, unspecified: Secondary | ICD-10-CM | POA: Diagnosis not present

## 2023-03-17 DIAGNOSIS — E876 Hypokalemia: Secondary | ICD-10-CM | POA: Insufficient documentation

## 2023-03-17 DIAGNOSIS — Z7901 Long term (current) use of anticoagulants: Secondary | ICD-10-CM | POA: Diagnosis not present

## 2023-03-17 DIAGNOSIS — R0602 Shortness of breath: Secondary | ICD-10-CM | POA: Diagnosis present

## 2023-03-17 LAB — COMPREHENSIVE METABOLIC PANEL
ALT: 23 U/L (ref 0–44)
AST: 24 U/L (ref 15–41)
Albumin: 3.5 g/dL (ref 3.5–5.0)
Alkaline Phosphatase: 58 U/L (ref 38–126)
Anion gap: 9 (ref 5–15)
BUN: 10 mg/dL (ref 8–23)
CO2: 31 mmol/L (ref 22–32)
Calcium: 8.7 mg/dL — ABNORMAL LOW (ref 8.9–10.3)
Chloride: 97 mmol/L — ABNORMAL LOW (ref 98–111)
Creatinine, Ser: 0.72 mg/dL (ref 0.61–1.24)
GFR, Estimated: >60 mL/min (ref >60)
Glucose, Bld: 128 mg/dL — ABNORMAL HIGH (ref 70–99)
Potassium: 3.3 mmol/L — ABNORMAL LOW (ref 3.5–5.1)
Sodium: 137 mmol/L (ref 135–145)
Total Bilirubin: 2.4 mg/dL — ABNORMAL HIGH (ref <1.2)
Total Protein: 6.2 g/dL — ABNORMAL LOW (ref 6.5–8.1)

## 2023-03-17 LAB — CBC WITH DIFFERENTIAL/PLATELET
Abs Immature Granulocytes: 0.05 10*3/uL (ref 0.00–0.07)
Basophils Absolute: 0.1 10*3/uL (ref 0.0–0.1)
Basophils Relative: 0 %
Eosinophils Absolute: 0.1 10*3/uL (ref 0.0–0.5)
Eosinophils Relative: 1 %
HCT: 50.2 % (ref 39.0–52.0)
Hemoglobin: 16.1 g/dL (ref 13.0–17.0)
Immature Granulocytes: 0 %
Lymphocytes Relative: 15 %
Lymphs Abs: 1.8 10*3/uL (ref 0.7–4.0)
MCH: 30.6 pg (ref 26.0–34.0)
MCHC: 32.1 g/dL (ref 30.0–36.0)
MCV: 95.4 fL (ref 80.0–100.0)
Monocytes Absolute: 0.9 10*3/uL (ref 0.1–1.0)
Monocytes Relative: 7 %
Neutro Abs: 9.3 10*3/uL — ABNORMAL HIGH (ref 1.7–7.7)
Neutrophils Relative %: 77 %
Platelets: 149 10*3/uL — ABNORMAL LOW (ref 150–400)
RBC: 5.26 MIL/uL (ref 4.22–5.81)
RDW: 14.7 % (ref 11.5–15.5)
WBC: 12.2 10*3/uL — ABNORMAL HIGH (ref 4.0–10.5)
nRBC: 0 % (ref 0.0–0.2)

## 2023-03-17 LAB — RESP PANEL BY RT-PCR (RSV, FLU A&B, COVID)  RVPGX2
Influenza A by PCR: NEGATIVE
Influenza B by PCR: NEGATIVE
Resp Syncytial Virus by PCR: NEGATIVE
SARS Coronavirus 2 by RT PCR: NEGATIVE

## 2023-03-17 LAB — BRAIN NATRIURETIC PEPTIDE: B Natriuretic Peptide: 206 pg/mL — ABNORMAL HIGH (ref 0.0–100.0)

## 2023-03-17 MED ORDER — IPRATROPIUM-ALBUTEROL 0.5-2.5 (3) MG/3ML IN SOLN
3.0000 mL | Freq: Once | RESPIRATORY_TRACT | Status: AC
  Administered 2023-03-17: 3 mL via RESPIRATORY_TRACT
  Filled 2023-03-17: qty 3

## 2023-03-17 MED ORDER — POTASSIUM CHLORIDE CRYS ER 20 MEQ PO TBCR
40.0000 meq | EXTENDED_RELEASE_TABLET | Freq: Once | ORAL | Status: AC
  Administered 2023-03-17: 40 meq via ORAL
  Filled 2023-03-17: qty 2

## 2023-03-17 MED ORDER — ALBUTEROL SULFATE HFA 108 (90 BASE) MCG/ACT IN AERS
2.0000 | INHALATION_SPRAY | Freq: Once | RESPIRATORY_TRACT | Status: AC
  Administered 2023-03-17: 2 via RESPIRATORY_TRACT
  Filled 2023-03-17: qty 6.7

## 2023-03-17 NOTE — Discharge Instructions (Signed)
You may use the albuterol inhaler 1-2 puffs every 4 hours as needed for cough, shortness of breath, wheezing.  If you develop fever, coughing up blood, trouble breathing, or any other new/concerning symptoms then return to the ER or call 911.

## 2023-03-17 NOTE — ED Provider Notes (Signed)
[place] EMERGENCY DEPARTMENT AT [facility] Provider Note   CSN: [id number] Arrival date & time: 03/17/23  1241     History  Chief Complaint  Patient presents with   Tachycardia    Roy [name] is a 72 y.o. male.  HPI 72 year old man presents with concern for pneumonia.  He started coughing today.  He is had some wheezing as well.  Last week a family member of his was diagnosed with pneumonia.  He has not had a fever or chest pain or vomiting.  No leg swelling.  He denies any lung disease such as asthma or COPD.  He felt like his heart was racing because he could see his abdomen palpitating but that is gone and he no longer feels the symptoms.  He has chronic lightheadedness from taking some a different meds but denies any new lightheadedness.  Home Medications Prior to Admission medications   Medication Sig Start Date End Date Taking? Authorizing Provider  acetaminophen (TYLENOL) 325 MG tablet Take 2 tablets (650 mg total) by mouth every 6 (six) hours as needed for headache. 01/13/21   Mariea Clonts, Courage, MD  alfuzosin (UROXATRAL) 10 MG 24 hr tablet TAKE ONE TABLET DAILY WITH BREAKFAST 01/03/23   Bjorn Pippin, MD  ELIQUIS 5 MG TABS tablet Take 1 tablet (5 mg total) by mouth 2 (two) times daily. Resume from may 9 09/13/22   Lanae Boast, MD  EQUATE STOOL SOFTENER 100 MG capsule Take 2 capsules by mouth twice daily 08/13/21   Carnella Guadalajara, PA-C  FEROSUL 325 (65 Fe) MG tablet Take 1 tablet by mouth once daily with breakfast 07/30/21   Rojelio Brenner M, PA-C  fludrocortisone (FLORINEF) 0.1 MG tablet TAKE ONE TABLET ONCE DAILY WITH BREAKFAST 12/11/22   Roma Kayser, MD  furosemide (LASIX) 40 MG tablet Take 1 tablet (40 mg total) by mouth daily. 12/25/22   Camnitz, Will Daphine Deutscher, MD  levocetirizine (XYZAL) 5 MG tablet Take 5 mg by mouth every evening. 08/30/22   [provider]  levothyroxine (SYNTHROID) 100 MCG tablet TAKE ONE TABLET ONCE DAILY BEFORE BREAKFAST  12/11/22   Roma Kayser, MD  magnesium oxide (MAG-OX) 400 (240 Mg) MG tablet Take 400 mg by mouth daily.    [provider]  mirabegron ER (MYRBETRIQ) 25 MG TB24 tablet 1-2 po qday.  Start with 1 and increase as needed. 01/31/23   Bjorn Pippin, MD  omeprazole (PRILOSEC) 10 MG capsule Take 20 mg by mouth daily. 10/26/21   [provider]  potassium chloride (KLOR-CON) 10 MEQ tablet TAKE TWO TABLETS TWICE DAILY 12/11/22   Roma Kayser, MD  predniSONE (DELTASONE) 10 MG tablet TAKE ONE TABLET ONCE DAILY WITH BREAKFAST 12/11/22   Roma Kayser, MD  pregabalin (LYRICA) 150 MG capsule Take 150 mg by mouth 2 (two) times daily. 08/14/22   [provider]  rosuvastatin (CRESTOR) 5 MG tablet Take 1 tablet by mouth at bedtime. 08/23/20   [provider]  valACYclovir (VALTREX) 1000 MG tablet Take 1,000 mg by mouth daily.    [provider]  vitamin B-12 (CYANOCOBALAMIN) 1000 MCG tablet Take 1 tablet (1,000 mcg total) by mouth daily. 02/28/21   Carnella Guadalajara, PA-C      Allergies    Adhesive [tape] and Sulfa antibiotics    Review of Systems   Review of Systems  Constitutional:  Negative for fever.  Respiratory:  Positive for cough, shortness of breath and wheezing.   Cardiovascular:  Positive for palpitations. Negative for chest pain.  Gastrointestinal:  Negative for abdominal pain and vomiting.    Physical Exam Updated Vital Signs BP 101/62   Pulse 60   Temp 98.4 F (36.9 C) (Oral)   Resp (!) 25   Ht 6' (1.829 m)   Wt 136.1 kg   SpO2 91%   BMI 40.69 kg/m  Physical Exam Vitals and nursing note reviewed.  Constitutional:      General: He is not in acute distress.    Appearance: He is well-developed. He is obese. He is not ill-appearing or diaphoretic.  HENT:     Head: Normocephalic and atraumatic.  Cardiovascular:     Rate and Rhythm: Normal rate and regular rhythm.     Heart sounds: Normal heart sounds.  Pulmonary:      Effort: Pulmonary effort is normal.     Breath sounds: Wheezing (slight, expiratory, diffuse) present.  Abdominal:     Palpations: Abdomen is soft.     Tenderness: There is no abdominal tenderness.  Musculoskeletal:     Right lower leg: No edema.     Left lower leg: No edema.  Skin:    General: Skin is warm and dry.  Neurological:     Mental Status: He is alert.     ED Results / Procedures / Treatments   Labs (all labs ordered are listed, but only abnormal results are displayed) Labs Reviewed  COMPREHENSIVE METABOLIC PANEL - Abnormal; Notable for the following components:      Result Value   Potassium 3.3 (*)    Chloride 97 (*)    Glucose, Bld 128 (*)    Calcium 8.7 (*)    Total Protein 6.2 (*)    Total Bilirubin 2.4 (*)    All other components within normal limits  CBC WITH DIFFERENTIAL/PLATELET - Abnormal; Notable for the following components:   WBC 12.2 (*)    Platelets 149 (*)    Neutro Abs 9.3 (*)    All other components within normal limits  BRAIN NATRIURETIC PEPTIDE - Abnormal; Notable for the following components:   B Natriuretic Peptide 206.0 (*)    All other components within normal limits  RESP PANEL BY RT-PCR (RSV, FLU A&B, COVID)  [id number]    EKG EKG Interpretation Date/Time:  Sunday March 17 2023 14:17:48 EST Ventricular Rate:  70 PR Interval:    QRS Duration:  155 QT Interval:  484 QTC Calculation: 523 R Axis:   -34  Text Interpretation: Atrial fibrillation Ventricular bigeminy Left bundle branch block Confirmed by Pricilla Loveless [phone]) on 03/17/2023 2:52:26 PM  Radiology DG Chest Portable 1 View  Result Date: 03/17/2023 CLINICAL DATA:  Shortness of breath. EXAM: PORTABLE CHEST 1 VIEW COMPARISON:  Sep 08, 2022. FINDINGS: Stable cardiomegaly. Left-sided pacemaker is unchanged in position. Right internal jugular Port-A-Cath is unchanged. Both lungs are clear. The visualized skeletal structures are unremarkable. IMPRESSION: No active disease.  Electronically Signed   By: Lupita Raider M.D.   On: 03/17/2023 13:57    Procedures Procedures    Medications Ordered in ED Medications  ipratropium-albuterol (DUONEB) 0.5-2.5 (3) MG/3ML nebulizer solution 3 mL (3 mLs Nebulization Given 03/17/23 1342)  potassium chloride SA (KLOR-CON M) CR tablet 40 mEq (40 mEq Oral Given 03/17/23 1423)  albuterol (VENTOLIN HFA) 108 (90 Base) MCG/ACT inhaler 2 puff (2 puffs Inhalation Given 03/17/23 1453)    ED Course/ Medical Decision Making/ A&P  Medical Decision Making Amount and/or Complexity of Data Reviewed Labs: ordered.    Details: Mild hypokalemia.  Nonspecific BNP. Radiology: ordered and independent interpretation performed.    Details: No pneumonia or CHF ECG/medicine tests: ordered and independent interpretation performed.    Details: A-fib but no ischemia  Risk Prescription drug management.   Patient feels better after a neb treatment.  Likely some mild bronchitis.  No evidence of a bacterial infection at this time.  He has chronic A-fib and is not having tachycardia or current palpitations.  Unclear why he had palpitations earlier though his heart rate was documented in the 140s prior to getting an EKG.  However given that his heart rate has been normal here, no hypotension after the initial triage, and well appearance with now resolved symptoms, I think he can be discharged home with return precautions.  He does not seem to have an asthma/COPD history so I do not think steroids are warranted.  He was given potassium for chronic hypokalemia.  Will discharge home with return precautions.        Final Clinical Impression(s) / ED Diagnoses Final diagnoses:  Acute bronchitis, unspecified organism    Rx / DC Orders ED Discharge Orders     None         Pricilla Loveless, MD 03/17/23 1457

## 2023-03-17 NOTE — ED Triage Notes (Signed)
Pt stated he has a hx of AFIB and complains of "my heart is running away on me"  Complains of SOB when laying  Cough started this morning   70 pound weight gain since July, on steroids

## 2023-03-20 ENCOUNTER — Emergency Department (HOSPITAL_COMMUNITY): Payer: 59

## 2023-03-20 ENCOUNTER — Emergency Department (HOSPITAL_COMMUNITY)
Admission: EM | Admit: 2023-03-20 | Discharge: 2023-03-20 | Disposition: A | Discharge status: Home / Self Care | Payer: 59 | Attending: Emergency Medicine | Admitting: Emergency Medicine

## 2023-03-20 ENCOUNTER — Encounter (HOSPITAL_COMMUNITY): Payer: Self-pay

## 2023-03-20 ENCOUNTER — Other Ambulatory Visit: Payer: Self-pay

## 2023-03-20 DIAGNOSIS — Z7901 Long term (current) use of anticoagulants: Secondary | ICD-10-CM | POA: Insufficient documentation

## 2023-03-20 DIAGNOSIS — J984 Other disorders of lung: Secondary | ICD-10-CM | POA: Diagnosis not present

## 2023-03-20 DIAGNOSIS — R300 Dysuria: Secondary | ICD-10-CM | POA: Insufficient documentation

## 2023-03-20 DIAGNOSIS — I700 Atherosclerosis of aorta: Secondary | ICD-10-CM | POA: Diagnosis not present

## 2023-03-20 DIAGNOSIS — J189 Pneumonia, unspecified organism: Secondary | ICD-10-CM

## 2023-03-20 DIAGNOSIS — J181 Lobar pneumonia, unspecified organism: Secondary | ICD-10-CM | POA: Diagnosis not present

## 2023-03-20 DIAGNOSIS — I4891 Unspecified atrial fibrillation: Secondary | ICD-10-CM | POA: Insufficient documentation

## 2023-03-20 DIAGNOSIS — D72829 Elevated white blood cell count, unspecified: Secondary | ICD-10-CM | POA: Diagnosis not present

## 2023-03-20 DIAGNOSIS — K5730 Diverticulosis of large intestine without perforation or abscess without bleeding: Secondary | ICD-10-CM | POA: Diagnosis not present

## 2023-03-20 LAB — CBC WITH DIFFERENTIAL/PLATELET
Abs Immature Granulocytes: 0.04 10*3/uL (ref 0.00–0.07)
Basophils Absolute: 0.1 10*3/uL (ref 0.0–0.1)
Basophils Relative: 1 %
Eosinophils Absolute: 0.1 10*3/uL (ref 0.0–0.5)
Eosinophils Relative: 1 %
HCT: 48.2 % (ref 39.0–52.0)
Hemoglobin: 16 g/dL (ref 13.0–17.0)
Immature Granulocytes: 0 %
Lymphocytes Relative: 21 %
Lymphs Abs: 2.3 10*3/uL (ref 0.7–4.0)
MCH: 30.9 pg (ref 26.0–34.0)
MCHC: 33.2 g/dL (ref 30.0–36.0)
MCV: 93.2 fL (ref 80.0–100.0)
Monocytes Absolute: 0.8 10*3/uL (ref 0.1–1.0)
Monocytes Relative: 8 %
Neutro Abs: 7.4 10*3/uL (ref 1.7–7.7)
Neutrophils Relative %: 69 %
Platelets: 154 10*3/uL (ref 150–400)
RBC: 5.17 MIL/uL (ref 4.22–5.81)
RDW: 15.1 % (ref 11.5–15.5)
WBC: 10.7 10*3/uL — ABNORMAL HIGH (ref 4.0–10.5)
nRBC: 0 % (ref 0.0–0.2)

## 2023-03-20 LAB — COMPREHENSIVE METABOLIC PANEL
ALT: 27 U/L (ref 0–44)
AST: 44 U/L — ABNORMAL HIGH (ref 15–41)
Albumin: 3.3 g/dL — ABNORMAL LOW (ref 3.5–5.0)
Alkaline Phosphatase: 81 U/L (ref 38–126)
Anion gap: 15 (ref 5–15)
BUN: 16 mg/dL (ref 8–23)
CO2: 27 mmol/L (ref 22–32)
Calcium: 8.8 mg/dL — ABNORMAL LOW (ref 8.9–10.3)
Chloride: 92 mmol/L — ABNORMAL LOW (ref 98–111)
Creatinine, Ser: 0.9 mg/dL (ref 0.61–1.24)
GFR, Estimated: >60 mL/min (ref >60)
Glucose, Bld: 108 mg/dL — ABNORMAL HIGH (ref 70–99)
Potassium: 3.3 mmol/L — ABNORMAL LOW (ref 3.5–5.1)
Sodium: 134 mmol/L — ABNORMAL LOW (ref 135–145)
Total Bilirubin: 3.7 mg/dL — ABNORMAL HIGH (ref <1.2)
Total Protein: 6.3 g/dL — ABNORMAL LOW (ref 6.5–8.1)

## 2023-03-20 LAB — URINALYSIS, ROUTINE W REFLEX MICROSCOPIC
Bilirubin Urine: NEGATIVE
Glucose, UA: NEGATIVE mg/dL
Hgb urine dipstick: NEGATIVE
Ketones, ur: 20 mg/dL — AB
Leukocytes,Ua: NEGATIVE
Nitrite: NEGATIVE
Protein, ur: NEGATIVE mg/dL
Specific Gravity, Urine: 1.019 (ref 1.005–1.030)
pH: 5 (ref 5.0–8.0)

## 2023-03-20 LAB — CK: Total CK: 354 U/L (ref 49–397)

## 2023-03-20 MED ORDER — HEPARIN SOD (PORK) LOCK FLUSH 100 UNIT/ML IV SOLN
500.0000 [IU] | Freq: Once | INTRAVENOUS | Status: AC
  Administered 2023-03-20: 500 [IU]
  Filled 2023-03-20: qty 5

## 2023-03-20 MED ORDER — AMOXICILLIN-POT CLAVULANATE 875-125 MG PO TABS: 1.0000 | ORAL_TABLET | Freq: Two times a day (BID) | ORAL | 0 refills | Status: AC

## 2023-03-20 MED ORDER — LACTATED RINGERS IV BOLUS
1000.0000 mL | Freq: Once | INTRAVENOUS | Status: AC
  Administered 2023-03-20: 1000 mL via INTRAVENOUS

## 2023-03-20 MED ORDER — IOHEXOL 300 MG/ML  SOLN
100.0000 mL | Freq: Once | INTRAMUSCULAR | Status: AC | PRN
  Administered 2023-03-20: 100 mL via INTRAVENOUS

## 2023-03-20 MED ORDER — AMOXICILLIN-POT CLAVULANATE 875-125 MG PO TABS
1.0000 | ORAL_TABLET | Freq: Once | ORAL | Status: AC
  Administered 2023-03-20: 1 via ORAL
  Filled 2023-03-20: qty 1

## 2023-03-20 MED ORDER — AZITHROMYCIN 250 MG PO TABS: 250.0000 mg | ORAL_TABLET | Freq: Every day | ORAL | 0 refills | Status: AC

## 2023-03-20 MED ORDER — AZITHROMYCIN 250 MG PO TABS
500.0000 mg | ORAL_TABLET | Freq: Once | ORAL | Status: AC
  Administered 2023-03-20: 500 mg via ORAL
  Filled 2023-03-20: qty 2

## 2023-03-20 NOTE — ED Provider Notes (Signed)
[place] EMERGENCY DEPARTMENT AT [facility] Provider Note   CSN: [id number] Arrival date & time: 03/20/23  1633     History Chief Complaint  Patient presents with   Dysuria    Roy [name] is a 72 y.o. male.  Patient with past history significant for atrial fibrillation, right bundle branch block, adrenal insufficiency, BPH, thrombocytopenia presents to the emergency department with concerns of dysuria.  Reports that he began to experience dark-colored urine this morning.  Urine color and output was at baseline yesterday.  Reports he is having mild discomfort but no significant burning with urination.  No other penile discharge outside of this.  Also endorsing that he feels weak all over and suspects that he may be dehydrated as he does not typically hydrate well.  Patient was seen on 03/17/2023 and diagnosed with acute bronchitis.  Denies any pelvic pain or testicular discomfort.   Dysuria Presenting symptoms: dysuria        Home Medications Prior to Admission medications   Medication Sig Start Date End Date Taking? Authorizing Provider  amoxicillin-clavulanate (AUGMENTIN) 875-125 MG tablet Take 1 tablet by mouth 2 (two) times daily for 5 days. 03/20/23 03/25/23 Yes Smitty Knudsen, PA-C  azithromycin (ZITHROMAX) 250 MG tablet Take 1 tablet (250 mg total) by mouth daily for 5 days. Take first 2 tablets together, then 1 every day until finished. 03/20/23 03/25/23 Yes Smitty Knudsen, PA-C  acetaminophen (TYLENOL) 325 MG tablet Take 2 tablets (650 mg total) by mouth every 6 (six) hours as needed for headache. 01/13/21   Shon Hale, MD  alfuzosin (UROXATRAL) 10 MG 24 hr tablet TAKE ONE TABLET DAILY WITH BREAKFAST 01/03/23   Bjorn Pippin, MD  ELIQUIS 5 MG TABS tablet Take 1 tablet (5 mg total) by mouth 2 (two) times daily. Resume from may 9 09/13/22   Lanae Boast, MD  EQUATE STOOL SOFTENER 100 MG capsule Take 2 capsules by mouth twice daily 08/13/21   Carnella Guadalajara,  PA-C  FEROSUL 325 (65 Fe) MG tablet Take 1 tablet by mouth once daily with breakfast 07/30/21   Rojelio Brenner M, PA-C  fludrocortisone (FLORINEF) 0.1 MG tablet TAKE ONE TABLET ONCE DAILY WITH BREAKFAST 12/11/22   Roma Kayser, MD  furosemide (LASIX) 40 MG tablet Take 1 tablet (40 mg total) by mouth daily. 12/25/22   Camnitz, Will Daphine Deutscher, MD  levocetirizine (XYZAL) 5 MG tablet Take 5 mg by mouth every evening. 08/30/22   [provider]  levothyroxine (SYNTHROID) 100 MCG tablet TAKE ONE TABLET ONCE DAILY BEFORE BREAKFAST 12/11/22   Roma Kayser, MD  magnesium oxide (MAG-OX) 400 (240 Mg) MG tablet Take 400 mg by mouth daily.    [provider]  mirabegron ER (MYRBETRIQ) 25 MG TB24 tablet 1-2 po qday.  Start with 1 and increase as needed. 01/31/23   Bjorn Pippin, MD  omeprazole (PRILOSEC) 10 MG capsule Take 20 mg by mouth daily. 10/26/21   [provider]  potassium chloride (KLOR-CON) 10 MEQ tablet TAKE TWO TABLETS TWICE DAILY 12/11/22   Roma Kayser, MD  predniSONE (DELTASONE) 10 MG tablet TAKE ONE TABLET ONCE DAILY WITH BREAKFAST 12/11/22   Roma Kayser, MD  pregabalin (LYRICA) 150 MG capsule Take 150 mg by mouth 2 (two) times daily. 08/14/22   [provider]  rosuvastatin (CRESTOR) 5 MG tablet Take 1 tablet by mouth at bedtime. 08/23/20   [provider]  valACYclovir (VALTREX) 1000 MG tablet Take 1,000 mg  by mouth daily.    [provider]  vitamin B-12 (CYANOCOBALAMIN) 1000 MCG tablet Take 1 tablet (1,000 mcg total) by mouth daily. 02/28/21   Carnella Guadalajara, PA-C      Allergies    Adhesive [tape] and Sulfa antibiotics    Review of Systems   Review of Systems  Genitourinary:  Positive for dysuria.  All other systems reviewed and are negative.   Physical Exam Updated Vital Signs BP 114/78   Pulse 72   Temp 98 F (36.7 C) (Oral)   Resp 20   Ht 6' (1.829 m)   Wt 136.1 kg   SpO2 93%   BMI 40.69  kg/m  Physical Exam Vitals and nursing note reviewed.  Constitutional:      General: He is not in acute distress.    Appearance: He is well-developed.  HENT:     Head: Normocephalic and atraumatic.  Eyes:     Conjunctiva/sclera: Conjunctivae normal.  Cardiovascular:     Rate and Rhythm: Normal rate and regular rhythm.     Heart sounds: No murmur heard. Pulmonary:     Effort: Pulmonary effort is normal. No respiratory distress.     Breath sounds: Normal breath sounds.  Abdominal:     Palpations: Abdomen is soft.     Tenderness: There is no abdominal tenderness. There is left CVA tenderness. There is no right CVA tenderness.  Musculoskeletal:        General: Tenderness present. No swelling. Normal range of motion.     Cervical back: Neck supple.     Comments: TTP in bilateral thighs, but no spasms, swelling, or rigidity noted.  Skin:    General: Skin is warm and dry.     Capillary Refill: Capillary refill takes less than 2 seconds.  Neurological:     Mental Status: He is alert.  Psychiatric:        Mood and Affect: Mood normal.     ED Results / Procedures / Treatments   Labs (all labs ordered are listed, but only abnormal results are displayed) Labs Reviewed  CBC WITH DIFFERENTIAL/PLATELET - Abnormal; Notable for the following components:      Result Value   WBC 10.7 (*)    All other components within normal limits  COMPREHENSIVE METABOLIC PANEL - Abnormal; Notable for the following components:   Sodium 134 (*)    Potassium 3.3 (*)    Chloride 92 (*)    Glucose, Bld 108 (*)    Calcium 8.8 (*)    Total Protein 6.3 (*)    Albumin 3.3 (*)    AST 44 (*)    Total Bilirubin 3.7 (*)    All other components within normal limits  URINALYSIS, ROUTINE W REFLEX MICROSCOPIC - Abnormal; Notable for the following components:   Color, Urine AMBER (*)    Ketones, ur 20 (*)    All other components within normal limits  CK    EKG EKG Interpretation Date/Time:  Wednesday  March 20 2023 17:26:42 EST Ventricular Rate:  70 PR Interval:    QRS Duration:  160 QT Interval:  508 QTC Calculation: 549 R Axis:   -34  Text Interpretation: Atrial fibrillation Ventricular premature complex Left bundle branch block Confirmed by Vonita Moss [id number]) on 03/20/2023 6:20:11 PM  Radiology CT ABDOMEN PELVIS W CONTRAST  Result Date: 03/20/2023 CLINICAL DATA:  Left-sided abdominal pain and flank pain. EXAM: CT ABDOMEN AND PELVIS WITH CONTRAST TECHNIQUE: Multidetector CT imaging of the abdomen and pelvis  was performed using the standard protocol following bolus administration of intravenous contrast. RADIATION DOSE REDUCTION: This exam was performed according to the departmental dose-optimization program which includes automated exposure control, adjustment of the mA and/or kV according to patient size and/or use of iterative reconstruction technique. CONTRAST:  OMNIPAQUE IOHEXOL 300 MG/ML  SOLN COMPARISON:  CT abdomen and pelvis 07/06/2022 FINDINGS: Lower chest: There is airspace disease in the left lower lobe. Hepatobiliary: No focal liver abnormality is seen. Status post cholecystectomy. No biliary dilatation. Pancreas: Unremarkable. No pancreatic ductal dilatation or surrounding inflammatory changes. Spleen: Normal in size without focal abnormality. Adrenals/Urinary Tract: Right superior pole cyst measures 1.5 cm. There is no hydronephrosis or perinephric fluid. The adrenal glands and bladder are within normal limits. Stomach/Bowel: Stomach is within normal limits. No evidence of bowel wall thickening, distention, or inflammatory changes. There is sigmoid colon diverticulosis. The appendix is not seen. Vascular/Lymphatic: Aortic atherosclerosis. No enlarged abdominal or pelvic lymph nodes. Reproductive: Prostate gland is mildly enlarged. Other: No abdominal wall hernia or abnormality. No abdominopelvic ascites. Musculoskeletal: Bones are diffusely osteopenic. IMPRESSION: 1.  No acute localizing process in the abdomen or pelvis. 2. Left lower lobe airspace disease worrisome for pneumonia. 3. Sigmoid colon diverticulosis. 4. Aortic atherosclerosis. Aortic Atherosclerosis (ICD10-I70.0). Electronically Signed   By: Darliss Cheney M.D.   On: 03/20/2023 21:20   DG Chest Portable 1 View  Result Date: 03/20/2023 CLINICAL DATA:  Weakness. EXAM: PORTABLE CHEST 1 VIEW COMPARISON:  March 17, 2023. FINDINGS: Stable cardiomegaly. Left-sided pacemaker is unchanged. Right internal jugular Port-A-Cath is unchanged. Minimal bibasilar subsegmental atelectasis is noted. IMPRESSION: Minimal bibasilar subsegmental atelectasis. Electronically Signed   By: Lupita Raider M.D.   On: 03/20/2023 19:50    Procedures Procedures   Medications Ordered in ED Medications  amoxicillin-clavulanate (AUGMENTIN) 875-125 MG per tablet 1 tablet (has no administration in time range)  azithromycin (ZITHROMAX) tablet 500 mg (has no administration in time range)  lactated ringers bolus 1,000 mL (1,000 mLs Intravenous Bolus 03/20/23 1849)  iohexol (OMNIPAQUE) 300 MG/ML solution 100 mL (100 mLs Intravenous Contrast Given 03/20/23 1954)    ED Course/ Medical Decision Making/ A&P                               Medical Decision Making Amount and/or Complexity of Data Reviewed Labs: ordered. Radiology: ordered.  Risk Prescription drug management.   This patient presents to the ED for concern of dysuria. Differential diagnosis includes UTI, urosepsis, rhabdomyolysis, AK   Lab Tests:  I Ordered, and personally interpreted labs.  The pertinent results include: CBC with mild leukocytosis at 10.7, urinalysis without evidence of infection although amber in color with some ketones present,CK unremarkable at 354.   Imaging Studies ordered:  I ordered imaging studies including chest x-ray, CT abdomen and pelvis I independently visualized and interpreted imaging which showed no acute cardiopulmonary  process, left lower lobe pneumonia, no acute abdominal abnormality, sigmoid colon diverticulosis I agree with the radiologist interpretation   Medicines ordered and prescription drug management:  I ordered medication including fluids, Augmentin, Zithromax for dehydration, pneumonia Reevaluation of the patient after these medicines showed that the patient improved I have reviewed the patients home medicines and have made adjustments as needed   Problem List / ED Course:  Patient past history significant for atrial fibrillation, right bundle branch block, adrenal insufficiency, BPH and thrombocytopenia here to the emergency department today with concerns of dark  urine.  Reports that he noted his urine change color this morning after urine was normal yesterday.  Was seen in the emergency department a few days ago and diagnosed with an acute bronchitis.  He is also endorsing some general increase fatigue, weakness, and diffuse muscle pain.  Denies any significant dysuria but is also endorsing some increased low back pain.  Previously had a kidney stone but this was several years ago.  Does report poor oral intake and appetite recently. On exam, patient appears well although is having some tenderness in bilateral thighs.  No spasming or twitching noted.  No significant abdominal tenderness and no appreciable CVA tenderness.  No other focal deficit no evidence of edema on lower extremities.  Will proceed with basic labs including CBC, CMP, urinalysis.  Will also add on CK for concern for possible rhabdomyolysis.  Will also evaluate with chest x-ray and EKG. Labs are largely unremarkable.  White count is improving compared to prior now at 10.7.  CMP with mild dehydration seen with sodium at 134, potassium 3.3, and chloride at 92.  Kidney function at baseline.  Some elevation in the total bilirubin level although unsure of clear etiology.  Urine itself is unremarkable with no signs of infection although color  is amber.  No blood or bilirubin in the urine.  CK is also unremarkable at 354.  No evidence of rhabdomyolysis.  Given findings, will proceed with CT abdomen pelvis for further evaluation of the urinary changes patient has been exhibiting as well as his left CVA tenderness.  Fluids initiated for mild dehydration. CT abdomen pelvis is unremarkable for any acute abdominal abnormality to explain current symptoms.  There is some evidence of left lower lobe pneumonia which likely explains initial symptom presentation several days ago in the emergency department.  Given this finding in the setting of COPD, will initiate treatment with Augmentin and Zithromax. CURB-65 score is currently 1, placing patient at low risk of mortality. Patient and son are in agreement with current treatment plan verbalized understanding strict return precautions.  Advised patient to follow-up with primary care provider regarding bilirubin lab.  All questions answered prior to patient discharge.  Patient discharged home in stable condition.  Final Clinical Impression(s) / ED Diagnoses Final diagnoses:  Community acquired pneumonia of left lower lobe of lung    Rx / DC Orders ED Discharge Orders          Ordered    amoxicillin-clavulanate (AUGMENTIN) 875-125 MG tablet  2 times daily        03/20/23 2156    azithromycin (ZITHROMAX) 250 MG tablet  Daily        03/20/23 2156              Salomon Mast 03/20/23 2203    Rondel Baton, MD 03/23/23 1104

## 2023-03-20 NOTE — Discharge Instructions (Signed)
You were seen in the emergency department today for urinary concerns.  Your labs and imaging were thankfully largely reassuring without any obvious findings to explain your current changes in urination.  I suspect this may be dehydration related as with IV fluids, your urine began to clear up.  Your CT scan did happen to find some pneumonia on the left lower side.  For this reason, you were started on a combination of Augmentin and Zithromax.  Please take these antibiotics as prescribed in the next several days.  If you have any acute decline or worsening in symptoms, return to the emergency department.  Otherwise follow-up with your primary care provider.

## 2023-03-20 NOTE — ED Triage Notes (Signed)
Pt reports he thinks he may be dehydrated because his urine "looks like syrup".

## 2023-04-01 ENCOUNTER — Inpatient Hospital Stay: Payer: 59 | Attending: Hematology

## 2023-04-01 ENCOUNTER — Inpatient Hospital Stay: Payer: 59

## 2023-04-01 VITALS — BP 152/94 | HR 86 | Temp 96.6°F | Resp 20

## 2023-04-01 DIAGNOSIS — Z79899 Other long term (current) drug therapy: Secondary | ICD-10-CM | POA: Insufficient documentation

## 2023-04-01 DIAGNOSIS — E2740 Unspecified adrenocortical insufficiency: Secondary | ICD-10-CM | POA: Diagnosis not present

## 2023-04-01 DIAGNOSIS — Z7952 Long term (current) use of systemic steroids: Secondary | ICD-10-CM | POA: Diagnosis not present

## 2023-04-01 DIAGNOSIS — Z95828 Presence of other vascular implants and grafts: Secondary | ICD-10-CM

## 2023-04-01 DIAGNOSIS — C8338 Diffuse large B-cell lymphoma, lymph nodes of multiple sites: Secondary | ICD-10-CM | POA: Diagnosis present

## 2023-04-01 DIAGNOSIS — Z452 Encounter for adjustment and management of vascular access device: Secondary | ICD-10-CM | POA: Diagnosis present

## 2023-04-01 DIAGNOSIS — G629 Polyneuropathy, unspecified: Secondary | ICD-10-CM | POA: Insufficient documentation

## 2023-04-01 LAB — COMPREHENSIVE METABOLIC PANEL
ALT: 24 U/L (ref 0–44)
AST: 28 U/L (ref 15–41)
Albumin: 3.5 g/dL (ref 3.5–5.0)
Alkaline Phosphatase: 64 U/L (ref 38–126)
Anion gap: 8 (ref 5–15)
BUN: 12 mg/dL (ref 8–23)
CO2: 31 mmol/L (ref 22–32)
Calcium: 9.1 mg/dL (ref 8.9–10.3)
Chloride: 101 mmol/L (ref 98–111)
Creatinine, Ser: 0.65 mg/dL (ref 0.61–1.24)
GFR, Estimated: >60 mL/min (ref >60)
Glucose, Bld: 111 mg/dL — ABNORMAL HIGH (ref 70–99)
Potassium: 3.8 mmol/L (ref 3.5–5.1)
Sodium: 140 mmol/L (ref 135–145)
Total Bilirubin: 1.3 mg/dL — ABNORMAL HIGH (ref <1.2)
Total Protein: 6.6 g/dL (ref 6.5–8.1)

## 2023-04-01 LAB — CBC WITH DIFFERENTIAL/PLATELET
Abs Immature Granulocytes: 0.03 10*3/uL (ref 0.00–0.07)
Basophils Absolute: 0.1 10*3/uL (ref 0.0–0.1)
Basophils Relative: 1 %
Eosinophils Absolute: 0.1 10*3/uL (ref 0.0–0.5)
Eosinophils Relative: 1 %
HCT: 51.2 % (ref 39.0–52.0)
Hemoglobin: 16.6 g/dL (ref 13.0–17.0)
Immature Granulocytes: 0 %
Lymphocytes Relative: 30 %
Lymphs Abs: 2.9 10*3/uL (ref 0.7–4.0)
MCH: 31 pg (ref 26.0–34.0)
MCHC: 32.4 g/dL (ref 30.0–36.0)
MCV: 95.7 fL (ref 80.0–100.0)
Monocytes Absolute: 0.5 10*3/uL (ref 0.1–1.0)
Monocytes Relative: 5 %
Neutro Abs: 6 10*3/uL (ref 1.7–7.7)
Neutrophils Relative %: 63 %
Platelets: 257 10*3/uL (ref 150–400)
RBC: 5.35 MIL/uL (ref 4.22–5.81)
RDW: 15.8 % — ABNORMAL HIGH (ref 11.5–15.5)
WBC: 9.6 10*3/uL (ref 4.0–10.5)
nRBC: 0 % (ref 0.0–0.2)

## 2023-04-01 LAB — LACTATE DEHYDROGENASE: LDH: 146 U/L (ref 98–192)

## 2023-04-01 MED ORDER — SODIUM CHLORIDE 0.9% FLUSH
10.0000 mL | INTRAVENOUS | Status: DC | PRN
Start: 2023-04-01 — End: 2023-06-24
  Administered 2023-04-01: 10 mL via INTRAVENOUS

## 2023-04-01 MED ORDER — HEPARIN SOD (PORK) LOCK FLUSH 100 UNIT/ML IV SOLN
500.0000 [IU] | Freq: Once | INTRAVENOUS | Status: AC
  Administered 2023-04-01: 500 [IU] via INTRAVENOUS

## 2023-04-01 NOTE — Progress Notes (Signed)
Cedric Fishman presented for Portacath access and flush.  Portacath located right chest wall accessed with  H 20 needle.  Good blood return present. Portacath flushed with 20ml NS and 500U/51ml Heparin and needle removed intact.  Procedure tolerated well and without incident.

## 2023-04-01 NOTE — Addendum Note (Signed)
Addended by: Vergia Alcon on: 04/01/2023 10:24 AM   Modules accepted: Orders

## 2023-04-01 NOTE — Progress Notes (Signed)
Remote pacemaker transmission.   

## 2023-04-08 ENCOUNTER — Inpatient Hospital Stay: Payer: 59 | Attending: Hematology | Admitting: Hematology

## 2023-04-08 VITALS — BP 134/84 | HR 85 | Temp 98.2°F | Resp 20

## 2023-04-08 DIAGNOSIS — G629 Polyneuropathy, unspecified: Secondary | ICD-10-CM | POA: Diagnosis not present

## 2023-04-08 DIAGNOSIS — C8338 Diffuse large B-cell lymphoma, lymph nodes of multiple sites: Secondary | ICD-10-CM | POA: Diagnosis present

## 2023-04-08 DIAGNOSIS — E2740 Unspecified adrenocortical insufficiency: Secondary | ICD-10-CM | POA: Insufficient documentation

## 2023-04-08 DIAGNOSIS — Z7952 Long term (current) use of systemic steroids: Secondary | ICD-10-CM | POA: Diagnosis not present

## 2023-04-08 DIAGNOSIS — K119 Disease of salivary gland, unspecified: Secondary | ICD-10-CM | POA: Diagnosis not present

## 2023-04-08 DIAGNOSIS — Z79899 Other long term (current) drug therapy: Secondary | ICD-10-CM | POA: Insufficient documentation

## 2023-04-08 NOTE — Progress Notes (Signed)
[facility] [address] [place] [address]    Clinic Day:  04/08/2023  Referring physician: Rebecka Apley, NP  Patient Care Team: Rebecka Apley, NP as PCP - General (Adult Health Nurse Practitioner) Regan Lemming, MD as PCP - Electrophysiology (Cardiology) Doreatha Massed, MD as Medical Oncologist (Medical Oncology)   ASSESSMENT & PLAN:   Assessment: 1.  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  High-grade lymphoma panel was negative. - Weight loss of 40 pounds in the last 4 months, stable weight over the last 1 month.  No fevers or night sweats.  -PET CT scan on 03/16/2021 which showed hypermetabolic enlarged spleen.  There are hypermetabolic neck lymph nodes.  Diffuse hypermetabolic activity within the marrow space of the spine.  Diffuse groundglass density within the lungs with moderate activity. - 6 cycles of R-CHOP from 04/13/2021 through 08/14/2021 - PET scan (09/22/2021): No evidence of metabolically active lymphoma, [place] 1.    2.  Social/family history: - He lives at home by himself.  His son is present today with him. - He is independent of ADLs and IADLs.  Son lives within 30 minutes.  He was never smoker. - He has work-related exposure to dyes.  No pesticide exposure. - Brother died of bone cancer.  Another brother also died of bone cancer.    Plan: 1.  Diffuse large B-cell lymphoma, stage IVb: - Denies any fevers, night sweats or weight loss in the last 4 months. - Physical exam: No palpable adenopathy. - I have reviewed recent CTAP from ER visit on 03/20/2023: Normal spleen size with no evidence of lymphadenopathy. - Labs from 04/01/2023: Normal LDH, CBC and LFTs. - He was recommended to try melatonin for sleep problems. - RTC after the biopsy.     2.  Parotid masses: - PET scan showed bilateral parotid masses, clinically consistent with Wharton's tumors. - He had to reschedule multiple biopsy appointments recommended by ENT Dr. Irene Limbo.  We will schedule his right parotid mass biopsy by ultrasound guidance.  He may hold Eliquis 2 days prior to the procedure if recommended by radiology.    3.  Peripheral neuropathy: - Continue Lyrica 200 mg at bedtime.  Symptoms are well-controlled.  He cannot tolerate morning doses due to drowsiness.  4.  Adrenal insufficiency: - Continue prednisone 10 mg daily and fludrocortisone 0.1 mg daily.    Orders Placed This Encounter  Procedures   [place] CORE BIOPSY (SOFT TISSUE)    Standing Status:   Future    Standing Expiration Date:   04/07/2024    Order Specific Question:   Lab orders requested (DO NOT place separate lab orders, these will be automatically ordered during procedure specimen collection):    Answer:   Surgical Pathology    Order Specific Question:   Reason for Exam (SYMPTOM  OR DIAGNOSIS REQUIRED)    Answer:   right parotid lesion    Order Specific Question:   Preferred location?    Answer:   [facility]    Order Specific Question:   Release to patient    Answer:   Immediate      I,Katie Daubenspeck,acting as a scribe for Doreatha Massed, MD.,have documented all relevant documentation on the behalf of Doreatha Massed, MD,as directed by  Doreatha Massed, MD while in the presence of Doreatha Massed, MD.  I, Doreatha Massed MD, have reviewed the above documentation for accuracy and completeness, and I agree with the above.   Doreatha Massed, MD   12/2/202411:42 AM  CHIEF COMPLAINT:   Diagnosis: diffuse large B-cell lymphoma, stage IVb    Cancer Staging  Large B-cell lymphoma (HCC) Staging form: Hodgkin and Non-Hodgkin Lymphoma, AJCC 8th Edition - Clinical stage from 03/22/2021: Stage IV (Diffuse large B-cell lymphoma) - Signed by Doreatha Massed, MD on 03/22/2021    Prior Therapy: 6 cycles of R-CHOP completed on 08/14/2021   Current Therapy:  Surveillance    HISTORY OF PRESENT ILLNESS:   Oncology History  Large B-cell lymphoma (HCC)  03/22/2021 Initial Diagnosis   DLBCL (diffuse large B cell lymphoma) (HCC)   03/22/2021 Cancer Staging   Staging form: Hodgkin and Non-Hodgkin Lymphoma, AJCC 8th Edition - Clinical stage from 03/22/2021: Stage IV (Diffuse large B-cell lymphoma) - Signed by Doreatha Massed, MD on 03/22/2021   04/12/2021 - 08/16/2021 Chemotherapy   Patient is on Treatment Plan : NON-HODGKINS LYMPHOMA R-CHOP q21d        INTERVAL HISTORY:   Mussa is a 72 y.o. male presenting to clinic today for follow up of diffuse large B-cell lymphoma, stage IVb. He was last seen by me on 11/26/22.  Of note since his last visit, he was seen in the ED on 03/17/23 for acute bronchitis and again on 03/20/23 for pneumonia.  Today, he states that he is doing well overall. His appetite level is at 100%. His energy level is at 40%.  PAST MEDICAL HISTORY:   Past Medical History: Past Medical History:  Diagnosis Date   Atrial fibrillation (HCC)    Cancer (HCC)    Diffuse Large B-Cell lymphoma   GERD (gastroesophageal reflux disease)    High cholesterol    Hypertension    Obesity     Surgical History: Past Surgical History:  Procedure Laterality Date   APPENDECTOMY     CATARACT EXTRACTION Bilateral 2022   CHOLECYSTECTOMY     ESOPHAGEAL DILATION     multiple times   IR IMAGING GUIDED PORT INSERTION  03/31/2021   JOINT REPLACEMENT Left    hip   PACEMAKER IMPLANT N/A 09/07/2022   Procedure: PACEMAKER IMPLANT;  Surgeon: Regan Lemming, MD;  Location: [facility] INVASIVE [facility] LAB;  Service: Cardiovascular;  Laterality: N/A;    Social History: Social History   Socioeconomic History   Marital status: Widowed    Spouse name: Not on file   Number of children: 3   Years of education: Not on file   Highest  education level: Not on file  Occupational History   Occupation: Retired  Tobacco Use   Smoking status: Never    Passive exposure: Never   Smokeless tobacco: Never  Vaping Use   Vaping status: Never Used  Substance and Sexual Activity   Alcohol use: No   Drug use: No   Sexual activity: Not Currently  Other Topics Concern   Not on file  Social History Narrative   Not on file   Social Determinants of Health   Financial Resource Strain: Low Risk  (11/12/2022)   Received from [facility]   Overall Financial Resource Strain (CARDIA)    Difficulty of Paying Living Expenses: Not hard at all  Food Insecurity: Low Risk  (11/30/2022)   Received from [facility]   Hunger Vital Sign    Worried About Running Out of Food in the Last Year: Never true    Ran Out  of Food in the Last Year: Never true  Transportation Needs: No Transportation Needs (11/12/2022)   Received from [facility] - Transportation    Lack of Transportation (Medical): No    Lack of Transportation (Non-Medical): No  Physical Activity: Sufficiently Active (05/02/2022)   Received from [facility], [facility]   Exercise Vital Sign    Days of Exercise per Week: 6 days    Minutes of Exercise per Session: 60 min  Stress: No Stress Concern Present (05/02/2022)   Received from [facility], [facility] - Occupational Stress Questionnaire    Feeling of Stress : Not at all  Social Connections: Socially Integrated (05/02/2022)   Received from [facility], [facility]   Social Network    How would you rate your social network (family, work, friends)?: Good participation with social networks  Intimate Partner Violence: Not At Risk (05/02/2022)   Received from [facility], [facility]   HITS    Over the last 12 months how often did your partner physically hurt you?: Never    Over the last 12 months how often did your partner insult you or talk down to you?:  Never    Over the last 12 months how often did your partner threaten you with physical harm?: Never    Over the last 12 months how often did your partner scream or curse at you?: Never    Family History: Family History  Problem Relation Age of Onset   Heart failure Father    Heart attack Father        Deceased    Thyroid disease Sister     Current Medications:  Current Outpatient Medications:    acetaminophen (TYLENOL) 325 MG tablet, Take 2 tablets (650 mg total) by mouth every 6 (six) hours as needed for headache., Disp: 30 tablet, Rfl: 1   alfuzosin (UROXATRAL) 10 MG 24 hr tablet, TAKE ONE TABLET DAILY WITH BREAKFAST, Disp: 90 tablet, Rfl: 3   ELIQUIS 5 MG TABS tablet, Take 1 tablet (5 mg total) by mouth 2 (two) times daily. Resume from may 9, Disp: 60 tablet, Rfl:    EQUATE STOOL SOFTENER 100 MG capsule, Take 2 capsules by mouth twice daily, Disp: 120 capsule, Rfl: 0   FEROSUL 325 (65 Fe) MG tablet, Take 1 tablet by mouth once daily with breakfast, Disp: 30 tablet, Rfl: 0   fludrocortisone (FLORINEF) 0.1 MG tablet, TAKE ONE TABLET ONCE DAILY WITH BREAKFAST, Disp: 90 tablet, Rfl: 1   furosemide (LASIX) 40 MG tablet, Take 1 tablet (40 mg total) by mouth daily., Disp: 7 tablet, Rfl: 0   levocetirizine (XYZAL) 5 MG tablet, Take 5 mg by mouth every evening., Disp: , Rfl:    levothyroxine (SYNTHROID) 100 MCG tablet, TAKE ONE TABLET ONCE DAILY BEFORE BREAKFAST, Disp: 90 tablet, Rfl: 1   magnesium oxide (MAG-OX) 400 (240 Mg) MG tablet, Take 400 mg by mouth daily., Disp: , Rfl:    mirabegron ER (MYRBETRIQ) 25 MG TB24 tablet, 1-2 po qday.  Start with 1 and increase as needed., Disp: 60 tablet, Rfl: 11   omeprazole (PRILOSEC) 10 MG capsule, Take 20 mg by mouth daily., Disp: , Rfl:    potassium chloride (KLOR-CON) 10 MEQ tablet, TAKE TWO TABLETS TWICE DAILY, Disp: 360 tablet, Rfl: 1   predniSONE (DELTASONE) 10 MG tablet, TAKE ONE TABLET ONCE DAILY WITH BREAKFAST, Disp: 90 tablet, Rfl: 1    pregabalin (LYRICA) 150 MG capsule,  Take 150 mg by mouth 2 (two) times daily., Disp: , Rfl:    rosuvastatin (CRESTOR) 5 MG tablet, Take 1 tablet by mouth at bedtime., Disp: , Rfl:    valACYclovir (VALTREX) 1000 MG tablet, Take 1,000 mg by mouth daily., Disp: , Rfl:    vitamin B-12 (CYANOCOBALAMIN) 1000 MCG tablet, Take 1 tablet (1,000 mcg total) by mouth daily., Disp: 30 tablet, Rfl: 3 No current facility-administered medications for this visit.  Facility-Administered Medications Ordered in Other Visits:    sodium chloride flush (NS) 0.9 % injection 10 mL, 10 mL, Intravenous, PRN, Doreatha Massed, MD, 10 mL at 04/01/23 1000   Allergies: Allergies  Allergen Reactions   Adhesive [Tape]     Contact dermatitis   Sulfa Antibiotics     UNKNOWN Childhood reaction    REVIEW OF SYSTEMS:   Review of Systems  Constitutional:  Negative for chills, fatigue and fever.  HENT:   Negative for lump/mass, mouth sores, nosebleeds, sore throat and trouble swallowing.   Eyes:  Negative for eye problems.  Respiratory:  Negative for cough and shortness of breath.   Cardiovascular:  Negative for chest pain, leg swelling and palpitations.  Gastrointestinal:  Negative for abdominal pain, constipation, diarrhea, nausea and vomiting.  Genitourinary:  Negative for bladder incontinence, difficulty urinating, dysuria, frequency, hematuria and nocturia.   Musculoskeletal:  Negative for arthralgias, back pain, flank pain, myalgias and neck pain.  Skin:  Negative for itching and rash.  Neurological:  Positive for headaches. Negative for dizziness and numbness.  Hematological:  Does not bruise/bleed easily.  Psychiatric/Behavioral:  Positive for sleep disturbance. Negative for depression and suicidal ideas. The patient is not nervous/anxious.   All other systems reviewed and are negative.    VITALS:   Blood pressure 134/84, pulse 85, temperature 98.2 F (36.8 C), temperature source Tympanic, resp. rate 20,  SpO2 97%.  Wt Readings from Last 3 Encounters:  03/20/23 300 lb (136.1 kg)  03/17/23 300 lb (136.1 kg)  01/31/23 (!) 301 lb 12.8 oz (136.9 kg)    There is no height or weight on file to calculate BMI.  Performance status (ECOG): 1 - Symptomatic but completely ambulatory  PHYSICAL EXAM:   Physical Exam Vitals and nursing note reviewed. Exam conducted with a chaperone present.  Constitutional:      Appearance: Normal appearance.  Cardiovascular:     Rate and Rhythm: Normal rate and regular rhythm.     Pulses: Normal pulses.     Heart sounds: Normal heart sounds.  Pulmonary:     Effort: Pulmonary effort is normal.     Breath sounds: Normal breath sounds.  Abdominal:     Palpations: Abdomen is soft. There is no hepatomegaly, splenomegaly or mass.     Tenderness: There is no abdominal tenderness.  Musculoskeletal:     Right lower leg: No edema.     Left lower leg: No edema.  Lymphadenopathy:     Cervical: No cervical adenopathy.     Right cervical: No superficial, deep or posterior cervical adenopathy.    Left cervical: No superficial, deep or posterior cervical adenopathy.     Upper Body:     Right upper body: No supraclavicular or axillary adenopathy.     Left upper body: No supraclavicular or axillary adenopathy.  Neurological:     General: No focal deficit present.     Mental Status: He is alert and oriented to person, place, and time.  Psychiatric:        Mood and  Affect: Mood normal.        Behavior: Behavior normal.     LABS:      Latest Ref Rng & Units 04/01/2023    9:54 AM 03/20/2023    5:21 PM 03/17/2023    1:17 PM  CBC  WBC 4.0 - 10.5 K/uL 9.6  10.7  12.2   Hemoglobin 13.0 - 17.0 g/dL 69.6  29.5  28.4   Hematocrit 39.0 - 52.0 % 51.2  48.2  50.2   Platelets 150 - 400 K/uL 257  154  149       Latest Ref Rng & Units 04/01/2023    9:54 AM 03/20/2023    5:21 PM 03/17/2023    1:17 PM  CMP  Glucose 70 - 99 mg/dL 132  440  102   BUN 8 - 23 mg/dL 12  16   10    Creatinine 0.61 - 1.24 mg/dL 7.25  3.66  4.40   Sodium 135 - 145 mmol/L 140  134  137   Potassium 3.5 - 5.1 mmol/L 3.8  3.3  3.3   Chloride 98 - 111 mmol/L 101  92  97   CO2 22 - 32 mmol/L 31  27  31    Calcium 8.9 - 10.3 mg/dL 9.1  8.8  8.7   Total Protein 6.5 - 8.1 g/dL 6.6  6.3  6.2   Total Bilirubin <1.2 mg/dL 1.3  3.7  2.4   Alkaline Phos 38 - 126 U/L 64  81  58   AST 15 - 41 U/L 28  44  24   ALT 0 - 44 U/L 24  27  23       No results found for: "CEA1", "CEA" / No results found for: "CEA1", "CEA" No results found for: "PSA1" No results found for: "CAN199" No results found for: "CAN125"  Lab Results  Component Value Date   TOTALPROTELP 6.1 02/28/2021   ALBUMINELP 3.5 02/21/2021   A1GS 0.3 02/21/2021   A2GS 0.8 02/21/2021   BETS 1.0 02/21/2021   GAMS 0.8 02/21/2021   MSPIKE Not Observed 02/21/2021   SPEI Comment 02/21/2021   Lab Results  Component Value Date   TIBC 416 02/21/2021   TIBC 369 11/11/2020   FERRITIN 152 02/21/2021   IRONPCTSAT 17 (L) 02/21/2021   IRONPCTSAT 41 (H) 11/11/2020   Lab Results  Component Value Date   LDH 146 04/01/2023   LDH 173 11/20/2022   LDH 169 06/21/2022     STUDIES:   CT ABDOMEN PELVIS W CONTRAST  Result Date: 03/20/2023 CLINICAL DATA:  Left-sided abdominal pain and flank pain. EXAM: CT ABDOMEN AND PELVIS WITH CONTRAST TECHNIQUE: Multidetector CT imaging of the abdomen and pelvis was performed using the standard protocol following bolus administration of intravenous contrast. RADIATION DOSE REDUCTION: This exam was performed according to the departmental dose-optimization program which includes automated exposure control, adjustment of the mA and/or kV according to patient size and/or use of iterative reconstruction technique. CONTRAST:  OMNIPAQUE IOHEXOL 300 MG/ML  SOLN COMPARISON:  CT abdomen and pelvis 07/06/2022 FINDINGS: Lower chest: There is airspace disease in the left lower lobe. Hepatobiliary: No focal liver  abnormality is seen. Status post cholecystectomy. No biliary dilatation. Pancreas: Unremarkable. No pancreatic ductal dilatation or surrounding inflammatory changes. Spleen: Normal in size without focal abnormality. Adrenals/Urinary Tract: Right superior pole cyst measures 1.5 cm. There is no hydronephrosis or perinephric fluid. The adrenal glands and bladder are within normal limits. Stomach/Bowel: Stomach is within normal limits. No  evidence of bowel wall thickening, distention, or inflammatory changes. There is sigmoid colon diverticulosis. The appendix is not seen. Vascular/Lymphatic: Aortic atherosclerosis. No enlarged abdominal or pelvic lymph nodes. Reproductive: Prostate gland is mildly enlarged. Other: No abdominal wall hernia or abnormality. No abdominopelvic ascites. Musculoskeletal: Bones are diffusely osteopenic. IMPRESSION: 1. No acute localizing process in the abdomen or pelvis. 2. Left lower lobe airspace disease worrisome for pneumonia. 3. Sigmoid colon diverticulosis. 4. Aortic atherosclerosis. Aortic Atherosclerosis (ICD10-I70.0). Electronically Signed   By: Darliss Cheney M.D.   On: 03/20/2023 21:20   DG Chest Portable 1 View  Result Date: 03/20/2023 CLINICAL DATA:  Weakness. EXAM: PORTABLE CHEST 1 VIEW COMPARISON:  March 17, 2023. FINDINGS: Stable cardiomegaly. Left-sided pacemaker is unchanged. Right internal jugular Port-A-Cath is unchanged. Minimal bibasilar subsegmental atelectasis is noted. IMPRESSION: Minimal bibasilar subsegmental atelectasis. Electronically Signed   By: Lupita Raider M.D.   On: 03/20/2023 19:50   DG Chest Portable 1 View  Result Date: 03/17/2023 CLINICAL DATA:  Shortness of breath. EXAM: PORTABLE CHEST 1 VIEW COMPARISON:  Sep 08, 2022. FINDINGS: Stable cardiomegaly. Left-sided pacemaker is unchanged in position. Right internal jugular Port-A-Cath is unchanged. Both lungs are clear. The visualized skeletal structures are unremarkable. IMPRESSION: No active  disease. Electronically Signed   By: Lupita Raider M.D.   On: 03/17/2023 13:57   CUP PACEART REMOTE DEVICE CHECK  Result Date: 03/11/2023 Scheduled remote reviewed. Normal device function.  Known permanent AF, on OAC, good ventricular rate control Next remote 91 days. ML, CVRS

## 2023-04-08 NOTE — Progress Notes (Signed)
Bennie Dallas, MD  Roy [name] PROCEDURE / BIOPSY REVIEW Date: 04/08/23  Requested Biopsy site: right parotid mass, FNA Reason for request: hx of lymphoma Imaging review: Best seen on PET CT 09/22/22  Decision: Approved Imaging modality to perform: Ultrasound Schedule with: Patient preference (Local vs Mod Sed) Schedule for: Any VIR  Additional comments: @VIR : FNA, deep mass  Please contact me with questions, concerns, or if issue pertaining to this request arise.  Bennie Dallas, MD [facility]       Previous Messages    ----- Message ----- From: Roy [name] Sent: 04/08/2023  12:01 PM EST To: Roy [name]; Ir Procedure Requests Subject: [place] Core BX ( Soft Tissue)                      Procedure: [place] Core Biopsy ( soft tissue )  Reason: right parotid lesion Dx: Lesion of parotid gland [K11.9 (ICD-10-CM)]    History: CT abd pelvis w/ contrast / X- ray chest  Provider : Doreatha Massed, MD  Provider Contact : [phone]

## 2023-04-08 NOTE — Patient Instructions (Addendum)
[facility] at [facility] Discharge Instructions   You were seen and examined today by Dr. Ellin Saba.  He reviewed the results of your lab work which are normal/stable.  We will arrange for you to have a biopsy of your parotid gland. This will be done in [place] in radiology.   Take melatonin 5 mg every evening to help with your sleep.   We will see you back after the biopsy.   Return as scheduled.    Thank you for choosing [facility] at [facility] to provide your oncology and hematology care.  To afford each patient quality time with our provider, please arrive at least 15 minutes before your scheduled appointment time.   If you have a lab appointment with the Cancer Center please come in thru the Main Entrance and check in at the main information desk.  You need to re-schedule your appointment should you arrive 10 or more minutes late.  We strive to give you quality time with our providers, and arriving late affects you and other patients whose appointments are after yours.  Also, if you no show three or more times for appointments you may be dismissed from the clinic at the providers discretion.     Again, thank you for choosing [facility].  Our hope is that these requests will decrease the amount of time that you wait before being seen by our physicians.       _____________________________________________________________  Should you have questions after your visit to [facility], please contact our office at [phone] and follow the prompts.  Our office hours are 8:00 a.m. and 4:30 p.m. Monday - Friday.  Please note that voicemails left after 4:00 p.m. may not be returned until the following business day.  We are closed weekends and major holidays.  You do have access to a nurse 24-7, just call the main number to the clinic [phone] and do not press any options, hold on the line and a nurse will answer  the phone.    For prescription refill requests, have your pharmacy contact our office and allow 72 hours.    Due to Covid, you will need to wear a mask upon entering the hospital. If you do not have a mask, a mask will be given to you at the Main Entrance upon arrival. For doctor visits, patients may have 1 support person age 37 or older with them. For treatment visits, patients can not have anyone with them due to social distancing guidelines and our immunocompromised population.

## 2023-04-12 LAB — LIPID PANEL
Chol/HDL Ratio: 2.8 {ratio} (ref 0.0–5.0)
Cholesterol, Total: 125 mg/dL (ref 100–199)
HDL: 45 mg/dL (ref >39)
LDL Chol Calc (NIH): 60 mg/dL (ref 0–99)
Triglycerides: 107 mg/dL (ref 0–149)
VLDL Cholesterol Cal: 20 mg/dL (ref 5–40)

## 2023-04-12 LAB — T4, FREE: Free T4: 1.32 ng/dL (ref 0.82–1.77)

## 2023-04-12 LAB — TSH: TSH: 1.87 u[IU]/mL (ref 0.450–4.500)

## 2023-04-16 ENCOUNTER — Encounter: Payer: Self-pay | Admitting: "Endocrinology

## 2023-04-16 ENCOUNTER — Ambulatory Visit (INDEPENDENT_AMBULATORY_CARE_PROVIDER_SITE_OTHER): Payer: 59 | Admitting: "Endocrinology

## 2023-04-16 VITALS — BP 124/86 | HR 72 | Ht 72.0 in | Wt 295.8 lb

## 2023-04-16 DIAGNOSIS — E039 Hypothyroidism, unspecified: Secondary | ICD-10-CM

## 2023-04-16 DIAGNOSIS — E2740 Unspecified adrenocortical insufficiency: Secondary | ICD-10-CM

## 2023-04-16 DIAGNOSIS — E782 Mixed hyperlipidemia: Secondary | ICD-10-CM

## 2023-04-16 NOTE — Progress Notes (Signed)
04/16/2023, 8:59 AM   Subjective:    Patient ID: Roy [name], male    DOB: 1950-09-06, PCP Rebecka Apley, NP   Past Medical History:  Diagnosis Date   Atrial fibrillation (HCC)    Cancer (HCC)    Diffuse Large B-Cell lymphoma   GERD (gastroesophageal reflux disease)    High cholesterol    Hypertension    Obesity    Past Surgical History:  Procedure Laterality Date   APPENDECTOMY     CATARACT EXTRACTION Bilateral 2022   CHOLECYSTECTOMY     ESOPHAGEAL DILATION     multiple times   IR IMAGING GUIDED PORT INSERTION  03/31/2021   JOINT REPLACEMENT Left    hip   PACEMAKER IMPLANT N/A 09/07/2022   Procedure: PACEMAKER IMPLANT;  Surgeon: Regan Lemming, MD;  Location: [facility];  Service: Cardiovascular;  Laterality: N/A;   Social History   Socioeconomic History   Marital status: Widowed    Spouse name: Not on file   Number of children: 3   Years of education: Not on file   Highest education level: Not on file  Occupational History   Occupation: Retired  Tobacco Use   Smoking status: Never    Passive exposure: Never   Smokeless tobacco: Never  Vaping Use   Vaping status: Never Used  Substance and Sexual Activity   Alcohol use: No   Drug use: No   Sexual activity: Not Currently  Other Topics Concern   Not on file  Social History Narrative   Not on file   Social Determinants of Health   Financial Resource Strain: Low Risk  (11/12/2022)   Received from [facility]   Overall Financial Resource Strain (CARDIA)    Difficulty of Paying Living Expenses: Not hard at all  Food Insecurity: Low Risk  (11/30/2022)   Received from [facility]   Hunger Vital Sign    Worried About Running Out of Food in the Last Year: Never true    Ran Out of Food in the Last Year: Never true  Transportation Needs: No Transportation Needs (11/12/2022)   Received from [facility] - Transportation     Lack of Transportation (Medical): No    Lack of Transportation (Non-Medical): No  Physical Activity: Sufficiently Active (05/02/2022)   Received from [facility], [facility]   Exercise Vital Sign    Days of Exercise per Week: 6 days    Minutes of Exercise per Session: 60 min  Stress: No Stress Concern Present (05/02/2022)   Received from [facility], [facility] - Occupational Stress Questionnaire    Feeling of Stress : Not at all  Social Connections: Socially Integrated (05/02/2022)   Received from [facility], [facility]   Social Network    How would you rate your social network (family, work, friends)?: Good participation with social networks   Family History  Problem Relation Age of Onset   Heart failure Father    Heart attack Father        Deceased    Thyroid disease Sister    Outpatient Encounter Medications as of 04/16/2023  Medication Sig   acetaminophen (TYLENOL) 325 MG tablet  Take 2 tablets (650 mg total) by mouth every 6 (six) hours as needed for headache.   alfuzosin (UROXATRAL) 10 MG 24 hr tablet TAKE ONE TABLET DAILY WITH BREAKFAST   ELIQUIS 5 MG TABS tablet Take 1 tablet (5 mg total) by mouth 2 (two) times daily. Resume from may 9   EQUATE STOOL SOFTENER 100 MG capsule Take 2 capsules by mouth twice daily   FEROSUL 325 (65 Fe) MG tablet Take 1 tablet by mouth once daily with breakfast   fludrocortisone (FLORINEF) 0.1 MG tablet TAKE ONE TABLET ONCE DAILY WITH BREAKFAST   furosemide (LASIX) 40 MG tablet Take 1 tablet (40 mg total) by mouth daily.   levocetirizine (XYZAL) 5 MG tablet Take 5 mg by mouth every evening.   levothyroxine (SYNTHROID) 100 MCG tablet TAKE ONE TABLET ONCE DAILY BEFORE BREAKFAST   magnesium oxide (MAG-OX) 400 (240 Mg) MG tablet Take 400 mg by mouth daily.   mirabegron ER (MYRBETRIQ) 25 MG TB24 tablet 1-2 po qday.  Start with 1 and increase as needed.   omeprazole (PRILOSEC) 10 MG capsule  Take 20 mg by mouth daily.   potassium chloride (KLOR-CON) 10 MEQ tablet TAKE TWO TABLETS TWICE DAILY   predniSONE (DELTASONE) 10 MG tablet TAKE ONE TABLET ONCE DAILY WITH BREAKFAST   pregabalin (LYRICA) 150 MG capsule Take 150 mg by mouth 2 (two) times daily.   rosuvastatin (CRESTOR) 5 MG tablet Take 1 tablet by mouth at bedtime.   valACYclovir (VALTREX) 1000 MG tablet Take 1,000 mg by mouth daily.   vitamin B-12 (CYANOCOBALAMIN) 1000 MCG tablet Take 1 tablet (1,000 mcg total) by mouth daily.   Facility-Administered Encounter Medications as of 04/16/2023  Medication   sodium chloride flush (NS) 0.9 % injection 10 mL   ALLERGIES: Allergies  Allergen Reactions   Adhesive [Tape]     Contact dermatitis   Sulfa Antibiotics     UNKNOWN Childhood reaction    VACCINATION STATUS: Immunization History  Administered Date(s) Administered   Influenza,inj,Quad PF,6+ Mos 04/28/2015   Moderna Sars-Covid-2 Vaccination 07/03/2019, 07/31/2019   PFIZER(Purple Top)SARS-COV-2 Vaccination 03/25/2020   Pfizer Covid-19 Vaccine Bivalent Booster 43yrs & up 08/24/2021   Pneumococcal Polysaccharide-23 04/28/2015   Tdap 11/10/2022    HPI Roy [name] is 72 y.o. male who presents today with a medical history as above. he is being seen in follow-up after he was seen in consultation for adrenal insufficiency requested by Rebecka Apley, NP.  He also has hypothyroidism on levothyroxine.  His previsit thyroid function tests are consistent with appropriate replacement.    His previous ACTH stimulation test confirmed primary adrenal insufficiency.    He is currently on prednisone 10 mg p.o. daily, fludrocortisone 0.1 mg p.o. daily at breakfast.      More recently, he has seen his blood pressure stabilizing off of midodrine.      He did receive cardiac pacemaker recently.      He denies any history of head injury, visual field deficits, no headaches.  He denies any prior history of abdominal surgery,  injury, nor infections.   He returns with 5 pounds of weight loss since last visit.   He feels stronger and better, denies any swelling.  His other medical problems include hyperlipidemia on treatment with Crestor.  He has paroxysmal atrial fibrillation, left anterior fascicular block, currently on anticoagulation using Eliquis 5 mg twice a day.      Review of Systems  Constitutional: + Recent weight gain, + fatigue, no subjective  hyperthermia, no subjective hypothermia  Objective:       04/16/2023    8:45 AM 04/08/2023   11:32 AM 04/08/2023   11:22 AM  Vitals with BMI  Height 6\' 0"     Weight 295 lbs 13 oz    BMI 40.11    Systolic 124 134 161  Diastolic 86 84 106  Pulse 72  85    BP 124/86   Pulse 72   Ht 6' (1.829 m)   Wt 295 lb 12.8 oz (134.2 kg)   BMI 40.12 kg/m   Wt Readings from Last 3 Encounters:  04/16/23 295 lb 12.8 oz (134.2 kg)  03/20/23 300 lb (136.1 kg)  03/17/23 300 lb (136.1 kg)    Physical Exam  Constitutional:  Body mass index is 40.12 kg/m.,  not in acute distress, normal state of mind, +  walks with a cane   CMP ( most recent) CMP     Component Value Date/Time   NA 140 04/01/2023 0954   NA 143 10/04/2022 1308   K 3.8 04/01/2023 0954   CL 101 04/01/2023 0954   CO2 31 04/01/2023 0954   GLUCOSE 111 (H) 04/01/2023 0954   BUN 12 04/01/2023 0954   BUN 11 10/04/2022 1308   CREATININE 0.65 04/01/2023 0954   CREATININE 0.82 11/11/2020 1046   CALCIUM 9.1 04/01/2023 0954   PROT 6.6 04/01/2023 0954   PROT 6.1 10/04/2022 1308   ALBUMIN 3.5 04/01/2023 0954   ALBUMIN 4.1 10/04/2022 1308   AST 28 04/01/2023 0954   AST 74 (H) 11/11/2020 1046   ALT 24 04/01/2023 0954   ALT 21 11/11/2020 1046   ALKPHOS 64 04/01/2023 0954   BILITOT 1.3 (H) 04/01/2023 0954   BILITOT 0.9 10/04/2022 1308   BILITOT 2.3 (H) 11/11/2020 1046   GFRNONAA >60 04/01/2023 0954   GFRNONAA >60 11/11/2020 1046   GFRAA >60 04/29/2019 1033     Diabetic Labs (most recent): Lab  Results  Component Value Date   HGBA1C 5.6 10/11/2022   HGBA1C 5.1 01/08/2021     Lipid Panel ( most recent) Lipid Panel     Component Value Date/Time   CHOL 125 04/11/2023 1331   TRIG 107 04/11/2023 1331   HDL 45 04/11/2023 1331   CHOLHDL 2.8 04/11/2023 1331   CHOLHDL 7.7 04/29/2015 0426   VLDL 31 04/29/2015 0426   LDLCALC 60 04/11/2023 1331   LABVLDL 20 04/11/2023 1331      Lab Results  Component Value Date   TSH 1.870 04/11/2023   TSH 1.770 10/04/2022   TSH 1.541 09/05/2022   TSH 1.863 02/20/2022   TSH 10.200 (H) 10/03/2021   TSH 8.100 (H) 05/30/2021   TSH 0.770 02/23/2021   TSH 0.791 02/21/2021   TSH 0.036 (L) 01/08/2021   TSH 0.067 (L) 11/09/2020   FREET4 1.32 04/11/2023   FREET4 1.10 10/04/2022   FREET4 0.88 02/20/2022   FREET4 1.13 10/03/2021   FREET4 0.75 (L) 05/30/2021   FREET4 0.31 (L) 02/21/2021   FREET4 0.74 01/09/2021   FREET4 1.12 11/09/2020   FREET4 1.75 (H) 10/21/2020   FREET4 0.98 03/17/2018       Assessment & Plan:   1. Adrenal insufficiency (HCC) 2.  Hypothyroidism 3.  Hyperlipidemia  I reviewed and discussed his recent labs with him.  He would need steroid support for long-term.   He is advised to continue prednisone 10 mg p.o. daily at breakfast for glucocorticoid replacement and Florinef 0.1 mg p.o. daily for mineralocorticoid replacement.  His repeat blood pressure is at 124/86, off of midodrine.   We discussed about sick day rules on prednisone.  -I have emphasized the absolute need for steroid replacement on a daily basis for long-term.  Regarding his hypothyroidism: He is previsit thyroid function tests are consistent with appropriate replacement.  He is advised to continue levothyroxine 100 mcg p.o. daily before breakfast.     - We discussed about the correct intake of his thyroid hormone, on empty stomach at fasting, with water, separated by at least 30 minutes from breakfast and other medications,  and separated by more than  4 hours from calcium, iron, multivitamins, acid reflux medications (PPIs). -Patient is made aware of the fact that thyroid hormone replacement is needed for life, dose to be adjusted by periodic monitoring of thyroid function tests.   His previsit labs showed normal CMP with sodium 140, potassium 3.8.    -He is advised on fall precaution, encouraged to use his cane for equilibrium.    Regarding his hyperlipidemia: His recent LDL is 97.  He is advised to continue Crestor 5 mg p.o. nightly.  His point-of-care A1c is 5.6% indicating absence of prediabetes/diabetes.   - he is advised to maintain close follow up with Hemberg, Ruby Cola, NP for primary care needs.   I spent  22  minutes in the care of the patient today including review of labs from Thyroid Function, CMP, and other relevant labs ; imaging/biopsy records (current and previous including abstractions from other facilities); face-to-face time discussing  his lab results and symptoms, medications doses, his options of short and long term treatment based on the latest standards of care / guidelines;   and documenting the encounter.  Roy [name]  participated in the discussions, expressed understanding, and voiced agreement with the above plans.  All questions were answered to his satisfaction. he is encouraged to contact clinic should he have any questions or concerns prior to his return visit.    Follow up plan: Return in about 6 months (around 10/15/2023) for F/U with Pre-visit Labs.   Marquis Lunch, MD [facility] [address] [place], [place] [address] Phone: [phone]  Fax: [phone]     04/16/2023, 8:59 AM  This note was partially dictated with voice recognition software. Similar sounding words can be transcribed inadequately or may not  be corrected upon review.  Dexamethasone

## 2023-04-22 ENCOUNTER — Inpatient Hospital Stay: Payer: 59 | Admitting: Hematology

## 2023-05-03 ENCOUNTER — Other Ambulatory Visit: Payer: Self-pay

## 2023-05-09 ENCOUNTER — Ambulatory Visit: Payer: 59 | Admitting: Urology

## 2023-05-09 VITALS — BP 177/96 | HR 79

## 2023-05-09 DIAGNOSIS — R350 Frequency of micturition: Secondary | ICD-10-CM | POA: Diagnosis not present

## 2023-05-09 DIAGNOSIS — Z87440 Personal history of urinary (tract) infections: Secondary | ICD-10-CM | POA: Diagnosis not present

## 2023-05-09 DIAGNOSIS — N3941 Urge incontinence: Secondary | ICD-10-CM

## 2023-05-09 DIAGNOSIS — N400 Enlarged prostate without lower urinary tract symptoms: Secondary | ICD-10-CM

## 2023-05-09 DIAGNOSIS — R339 Retention of urine, unspecified: Secondary | ICD-10-CM

## 2023-05-09 LAB — URINALYSIS, ROUTINE W REFLEX MICROSCOPIC
Bilirubin, UA: NEGATIVE
Glucose, UA: NEGATIVE
Ketones, UA: NEGATIVE
Leukocytes,UA: NEGATIVE
Nitrite, UA: NEGATIVE
Protein,UA: NEGATIVE
RBC, UA: NEGATIVE
Specific Gravity, UA: 1.015 (ref 1.005–1.030)
Urobilinogen, Ur: 0.2 mg/dL (ref 0.2–1.0)
pH, UA: 6.5 (ref 5.0–7.5)

## 2023-05-09 MED ORDER — ALFUZOSIN HCL ER 10 MG PO TB24: ORAL_TABLET | ORAL | 3 refills | Status: DC

## 2023-05-09 NOTE — Progress Notes (Signed)
 Subjective: 1. Urge incontinence of urine   2. Benign prostatic hyperplasia, unspecified whether lower urinary tract symptoms present   3. Urinary frequency   4. Personal history of urinary infection   5. Incomplete bladder emptying      05/09/23: Roy [name] returns today in f/u.  He has BPH with BOO and OAB wet with DI.  He remains on afluzosin and was given Myrbetriq  at his last visit.   He couldn't tolerate the myrbetriq  but he is doing much better on the alfuzosin  with reduced nocturia and urgency. His IPSS is 12 with nocturia x 2.  His UA is clear.   01/31/23: Roy [name] returns today in f/u from [facility].   He was found to have a capacity with instability.  He had a voiding pressure of 37cm with a PF of 59ml/sec and a PVR of .  His prostate volume is about 25ml on measurements from a CT in 3/24. His IPSS is 18 with nocturia x 2 and urgency and frequency.  He remains on alfuzosin  daily but is not sure it is helping.   He has a 1 month history of knot in the posterior right thigh.  He is constipated with a BM about every 4 days.  His UA is clear. Today.    01/03/23: Roy [name] returns today in f/u.  He has a history of BPH with BOO with UUI and UTI's.  He has increased frequency since starting furosemide  40mg  daily for a week.  He stopped it Weds.  The frequency is improving.  He continues to have UUI which has worsened since his last visit.  He has no dysuria or hematuria.  He has gained weight on prednisone , 60lb in 3 months.  His PVR is 0ml.  He remains on alfuzosin .  His UA is clear today.     07/12/22: Roy [name] returns today in f/u.  His culture finally came back today with 2 bacteria including E. Coli and Klebsiella.  He was switched to Vantin  in the ER on Friday night when he went with dehydration.  He is feeling much better with reduced frequency and urgency.  His UA is unremarkable today.  In the ER his cr was 1.34 and his WBC was 14.6.  THe UA had 11-20 WBC. A CT was done that showed some bladder  changes suggestive of cystitis.   07/05/22: Roy [name] returns today with the onset last night of cloudy urine with frequency, urgency and small dribbling voids.  He had no fever.  He remains on alfuzosin  for BPH with BOO and his PVR is 0ml.  His UA looks infected today.  He has had no gross hematuria.   CMP and CBC were unremarkable on 06/21/21 apart from a mild leukocytosis and a T.B. of 2.4.   02/01/22: Roy [name] returns today in f/u.  He is on Uroxatrol and off of Gemtesa  since his last visit.  He is voiding better.  He has urgency and can have some accidents if not careful.  He can have some terminal intermittency. He has rare nocturia.   His PVR is down to 10ml from over .  His UA is clear.      11/30/21: Roy [name] returns today in f/u for cystoscopy.  He had blurry vision and dizziness with trospium  and stopped it after 10 days.  His nocturia is down to 1x.  He has daytime frequency q1hr.  He was told to drink 6 bottles of water daily.  He can avoid the UUI.  His PVR  is 0ml.    10/19/21: Roy [name] is a 73 yo male who is sent for BPH with BOO and UUI.  He had a bout of food poisoning about 3 weeks ago with N/V and diarrhea but the UUI has been going on for 3-4 months.  He will leak every time he makes a step.  He is not wearing pads.  He has no hematuria or dysuria.  He has nocturia x 3 with UUI.  He has daytime frequency 10x with UUI as well.  He has no SUI.  He has a reduced stream and doesn't feel empty.  He had a CT AP on 09/27/21 and had no GU abnormalities. His bladder was not distended and his prostate wasn't too large.   He has had no UTI's or Gu surgery.  His UA is clear today.  He is on tamsulosin  but that hasn't helped.  He had labs on 09/27/21 and had normal renal function.  His Bili was 2.1 with an ALT of 47 and some changes suggestive of cirrhosis on the CT.   He had a UA with many bacteria in 3/23 with Mx species on the culture but a negative UA on 09/27/21.  I don't see a PSA in his labs.           ROS:  Review of Systems  HENT:  Positive for congestion.   Neurological:  Positive for headaches.    Allergies  Allergen Reactions   Adhesive [Tape]     Contact dermatitis   Sulfa Antibiotics     UNKNOWN Childhood reaction    Past Medical History:  Diagnosis Date   Atrial fibrillation (HCC)    Cancer (HCC)    Diffuse Large B-Cell lymphoma   GERD (gastroesophageal reflux disease)    High cholesterol    Hypertension    Obesity     Past Surgical History:  Procedure Laterality Date   APPENDECTOMY     CATARACT EXTRACTION Bilateral 2022   CHOLECYSTECTOMY     ESOPHAGEAL DILATION     multiple times   IR IMAGING GUIDED PORT INSERTION  03/31/2021   JOINT REPLACEMENT Left    hip   PACEMAKER IMPLANT N/A 09/07/2022   Procedure: PACEMAKER IMPLANT;  Surgeon: Inocencio Soyla Lunger, MD;  Location: [facility];  Service: Cardiovascular;  Laterality: N/A;    Social History   Socioeconomic History   Marital status: Widowed    Spouse name: Not on file   Number of children: 3   Years of education: Not on file   Highest education level: Not on file  Occupational History   Occupation: Retired  Tobacco Use   Smoking status: Never    Passive exposure: Never   Smokeless tobacco: Never  Vaping Use   Vaping status: Never Used  Substance and Sexual Activity   Alcohol use: No   Drug use: No   Sexual activity: Not Currently  Other Topics Concern   Not on file  Social History Narrative   Not on file   Social Drivers of Health   Financial Resource Strain: Low Risk  (04/25/2023)   Received from [facility]   Overall Financial Resource Strain (CARDIA)    Difficulty of Paying Living Expenses: Not very hard  Food Insecurity: No Food Insecurity (04/25/2023)   Received from [facility]   Hunger Vital Sign    Worried About Running Out of Food in the Last Year: Never true    Ran Out of Food in the Last Year:  Never true  Transportation Needs: No  Transportation Needs (04/25/2023)   Received from [facility]   PRAPARE - Transportation    Lack of Transportation (Medical): No    Lack of Transportation (Non-Medical): No  Physical Activity: Unknown (04/25/2023)   Received from [facility]   Exercise Vital Sign    Days of Exercise per Week: 0 days    Minutes of Exercise per Session: Not on file  Recent Concern: Physical Activity - Inactive (04/25/2023)   Received from [facility]   Exercise Vital Sign    Days of Exercise per Week: 0 days    Minutes of Exercise per Session: 60 min  Stress: No Stress Concern Present (04/25/2023)   Received from [facility] - Occupational Stress Questionnaire    Feeling of Stress : Not at all  Social Connections: Socially Integrated (04/25/2023)   Received from [facility]   Social Network    How would you rate your social network (family, work, friends)?: Good participation with social networks  Intimate Partner Violence: Not At Risk (04/25/2023)   Received from [facility]   HITS    Over the last 12 months how often did your partner physically hurt you?: Never    Over the last 12 months how often did your partner insult you or talk down to you?: Never    Over the last 12 months how often did your partner threaten you with physical harm?: Never    Over the last 12 months how often did your partner scream or curse at you?: Never    Family History  Problem Relation Age of Onset   Heart failure Father    Heart attack Father        Deceased    Thyroid  disease Sister     Anti-infectives: Anti-infectives (From admission, onward)    None       Current Outpatient Medications  Medication Sig Dispense Refill   acetaminophen  (TYLENOL ) 325 MG tablet Take 2 tablets (650 mg total) by mouth every 6 (six) hours as needed for headache. 30 tablet 1   ELIQUIS  5 MG TABS tablet Take 1 tablet (5 mg total) by mouth 2 (two) times daily. Resume from may  9 60 tablet    EQUATE STOOL SOFTENER 100 MG capsule Take 2 capsules by mouth twice daily 120 capsule 0   FEROSUL 325 (65 Fe) MG tablet Take 1 tablet by mouth once daily with breakfast 30 tablet 0   fludrocortisone  (FLORINEF ) 0.1 MG tablet TAKE ONE TABLET ONCE DAILY WITH BREAKFAST 90 tablet 1   furosemide  (LASIX ) 40 MG tablet Take 1 tablet (40 mg total) by mouth daily. 7 tablet 0   levocetirizine (XYZAL) 5 MG tablet Take 5 mg by mouth every evening.     levothyroxine  (SYNTHROID ) 100 MCG tablet TAKE ONE TABLET ONCE DAILY BEFORE BREAKFAST 90 tablet 1   magnesium  oxide (MAG-OX) 400 (240 Mg) MG tablet Take 400 mg by mouth daily.     omeprazole  (PRILOSEC ) 10 MG capsule Take 20 mg by mouth daily.     potassium chloride  (KLOR-CON ) 10 MEQ tablet TAKE TWO TABLETS TWICE DAILY 360 tablet 1   predniSONE  (DELTASONE ) 10 MG tablet TAKE ONE TABLET ONCE DAILY WITH BREAKFAST 90 tablet 1   pregabalin  (LYRICA ) 150 MG capsule Take 150 mg by mouth 2 (two) times daily.     rosuvastatin  (CRESTOR ) 5 MG tablet Take 1 tablet by mouth at bedtime.     valACYclovir  (  VALTREX ) 1000 MG tablet Take 1,000 mg by mouth daily.     vitamin B-12 (CYANOCOBALAMIN ) 1000 MCG tablet Take 1 tablet (1,000 mcg total) by mouth daily. 30 tablet 3   alfuzosin  (UROXATRAL ) 10 MG 24 hr tablet TAKE ONE TABLET DAILY WITH BREAKFAST 90 tablet 3   No current facility-administered medications for this visit.   Facility-Administered Medications Ordered in Other Visits  Medication Dose Route Frequency Provider Last Rate Last Admin   sodium chloride  flush (NS) 0.9 % injection 10 mL  10 mL Intravenous PRN Rogers Hai, MD   10 mL at 04/01/23 1000     Objective: Vital signs in last 24 hours: BP (!) 177/96   Pulse 79   Intake/Output from previous day: No intake/output data recorded. Intake/Output this shift: @IOTHISSHIFT @   Physical Exam Musculoskeletal:     Comments: I don't feel any abnormality on the right inner thigh other than  fatty tissue.      Lab Results:  Results for orders placed or performed in visit on 05/09/23 (from the past 24 hours)  Urinalysis, Routine w reflex microscopic     Status: None   Collection Time: 05/09/23  1:55 PM  Result Value Ref Range   Specific Gravity, UA 1.015 1.005 - 1.030   pH, UA 6.5 5.0 - 7.5   Color, UA Yellow Yellow   Appearance Ur Clear Clear   Leukocytes,UA Negative Negative   Protein,UA Negative Negative/Trace   Glucose, UA Negative Negative   Ketones, UA Negative Negative   RBC, UA Negative Negative   Bilirubin, UA Negative Negative   Urobilinogen, Ur 0.2 0.2 - 1.0 mg/dL   Nitrite, UA Negative Negative   Microscopic Examination Comment    Narrative   Performed at:  [address] [facility] [address] [place], [place]  [id number] Lab Director: Cherlyn Ore MT, Phone:  [phone]        Recent Results (from the past 2160 hours)  CUP PACEART REMOTE DEVICE CHECK     Status: None   Collection Time: 03/11/23  3:25 AM  Result Value Ref Range   Date Time Interrogation Session [id number]    Pulse Generator Manufacturer MERM    Pulse Gen Model [id number] Azure XT DR MRI    Pulse Gen Serial Number [id number]    Clinic Name [facility]    Implantable Pulse Generator Type Implantable Pulse Generator    Implantable Pulse Generator Implant Date 79759496    Implantable Lead Manufacturer MERM    Implantable Lead Model 3830 SelectSecure MRI SureScan    Implantable Lead Serial Number [id number]    Implantable Lead Implant Date 79759496    Implantable Lead Location Detail 1 UNKNOWN    Implantable Lead Special Function LBBB    Implantable Lead Location [id number]    Implantable Lead Connection Status [id number]    Implantable Lead Manufacturer MERM    Implantable Lead Model 5076 CapSureFix Novus MRI SureScan    Implantable Lead Serial Number [id number]    Implantable Lead Implant Date 79759496    Implantable Lead Location Detail 1 APPENDAGE    Implantable Lead  Location [id number]    Implantable Lead Connection Status [id number]    Lead Channel Setting Sensing Sensitivity 2.8 mV   Lead Channel Setting Pacing Pulse Width 0.4 ms   Lead Channel Setting Pacing Amplitude 2 V   Zone Setting Status 755011    Zone Setting Status 755011    Lead Channel Impedance Value 570 ohm   Lead Channel Impedance Value 380  ohm   Lead Channel Sensing Intrinsic Amplitude 2.625 mV   Lead Channel Sensing Intrinsic Amplitude 2.625 mV   Lead Channel Impedance Value 551 ohm   Lead Channel Impedance Value 418 ohm   Lead Channel Sensing Intrinsic Amplitude 13.75 mV   Lead Channel Sensing Intrinsic Amplitude 13.75 mV   Lead Channel Pacing Threshold Amplitude 0.75 V   Lead Channel Pacing Threshold Pulse Width 0.4 ms   Battery Status OK    Battery Remaining Longevity 155 mo   Battery Voltage 3.14 V   Brady Statistic RA Percent Paced 0 %   Brady Statistic RV Percent Paced 90.8 %  Comprehensive metabolic panel     Status: Abnormal   Collection Time: 03/17/23  1:17 PM  Result Value Ref Range   Sodium 137 135 - 145 mmol/L   Potassium 3.3 (L) 3.5 - 5.1 mmol/L   Chloride 97 (L) 98 - 111 mmol/L   CO2 31 22 - 32 mmol/L   Glucose, Bld 128 (H) 70 - 99 mg/dL    Comment: Glucose reference range applies only to samples taken after fasting for at least 8 hours.   BUN 10 8 - 23 mg/dL   Creatinine, Ser 9.27 0.61 - 1.24 mg/dL   Calcium  8.7 (L) 8.9 - 10.3 mg/dL   Total Protein 6.2 (L) 6.5 - 8.1 g/dL   Albumin 3.5 3.5 - 5.0 g/dL   AST 24 15 - 41 U/L   ALT 23 0 - 44 U/L   Alkaline Phosphatase 58 38 - 126 U/L   Total Bilirubin 2.4 (H) <1.2 mg/dL   GFR, Estimated >39 >39 mL/min    Comment: (NOTE) Calculated using the CKD-EPI Creatinine Equation (2021)    Anion gap 9 5 - 15    Comment: Performed at [facility], [address], [place], [place] [address]  CBC with Differential     Status: Abnormal   Collection Time: 03/17/23  1:17 PM  Result Value Ref Range   WBC 12.2 (H) 4.0 - 10.5 K/uL    RBC 5.26 4.22 - 5.81 MIL/uL   Hemoglobin 16.1 13.0 - 17.0 g/dL   HCT 49.7 60.9 - 47.9 %   MCV 95.4 80.0 - 100.0 fL   MCH 30.6 26.0 - 34.0 pg   MCHC 32.1 30.0 - 36.0 g/dL   RDW 85.2 88.4 - 84.4 %   Platelets 149 (L) 150 - 400 K/uL   nRBC 0.0 0.0 - 0.2 %   Neutrophils Relative % 77 %   Neutro Abs 9.3 (H) 1.7 - 7.7 K/uL   Lymphocytes Relative 15 %   Lymphs Abs 1.8 0.7 - 4.0 K/uL   Monocytes Relative 7 %   Monocytes Absolute 0.9 0.1 - 1.0 K/uL   Eosinophils Relative 1 %   Eosinophils Absolute 0.1 0.0 - 0.5 K/uL   Basophils Relative 0 %   Basophils Absolute 0.1 0.0 - 0.1 K/uL   Immature Granulocytes 0 %   Abs Immature Granulocytes 0.05 0.00 - 0.07 K/uL    Comment: Performed at [facility], [address] [place], [place] [address]  Brain natriuretic peptide     Status: Abnormal   Collection Time: 03/17/23  1:17 PM  Result Value Ref Range   B Natriuretic Peptide 206.0 (H) 0.0 - 100.0 pg/mL    Comment: Performed at [facility], [address], [place], [place] [address]  Resp panel by RT-PCR (RSV, Flu A&B, Covid) Anterior Nasal Swab     Status: None   Collection Time:  03/17/23  1:18 PM   Specimen: Anterior Nasal Swab  Result Value Ref Range   SARS Coronavirus 2 by RT PCR NEGATIVE NEGATIVE    Comment: (NOTE) SARS-CoV-2 target nucleic acids are NOT DETECTED.  The SARS-CoV-2 RNA is generally detectable in upper respiratory specimens during the acute phase of infection. The lowest concentration of SARS-CoV-2 viral copies this assay can detect is 138 copies/mL. A negative result does not preclude SARS-Cov-2 infection and should not be used as the sole basis for treatment or other patient management decisions. A negative result may occur with  improper specimen collection/handling, submission of specimen other than nasopharyngeal swab, presence of viral mutation(s) within the areas targeted by this assay, and inadequate number of viral copies(<138 copies/mL). A negative result must  be combined with clinical observations, patient history, and epidemiological information. The expected result is Negative.  Fact Sheet for Patients:  bloggercourse.com  Fact Sheet for Healthcare Providers:  seriousbroker.it  This test is no t yet approved or cleared by the [place]  FDA and  has been authorized for detection and/or diagnosis of SARS-CoV-2 by FDA under an Emergency Use Authorization (EUA). This EUA will remain  in effect (meaning this test can be used) for the duration of the COVID-19 declaration under Section 564(b)(1) of the Act, 21 U.S.C.section 360bbb-3(b)(1), unless the authorization is terminated  or revoked sooner.       Influenza A by PCR NEGATIVE NEGATIVE   Influenza B by PCR NEGATIVE NEGATIVE    Comment: (NOTE) The Xpert Xpress SARS-CoV-2/FLU/RSV plus assay is intended as an aid in the diagnosis of influenza from Nasopharyngeal swab specimens and should not be used as a sole basis for treatment. Nasal washings and aspirates are unacceptable for Xpert Xpress SARS-CoV-2/FLU/RSV testing.  Fact Sheet for Patients: bloggercourse.com  Fact Sheet for Healthcare Providers: seriousbroker.it  This test is not yet approved or cleared by the [place]  FDA and has been authorized for detection and/or diagnosis of SARS-CoV-2 by FDA under an Emergency Use Authorization (EUA). This EUA will remain in effect (meaning this test can be used) for the duration of the COVID-19 declaration under Section 564(b)(1) of the Act, 21 U.S.C. section 360bbb-3(b)(1), unless the authorization is terminated or revoked.     Resp Syncytial Virus by PCR NEGATIVE NEGATIVE    Comment: (NOTE) Fact Sheet for Patients: bloggercourse.com  Fact Sheet for Healthcare Providers: seriousbroker.it  This test is not yet approved or  cleared by the [place]  FDA and has been authorized for detection and/or diagnosis of SARS-CoV-2 by FDA under an Emergency Use Authorization (EUA). This EUA will remain in effect (meaning this test can be used) for the duration of the COVID-19 declaration under Section 564(b)(1) of the Act, 21 U.S.C. section 360bbb-3(b)(1), unless the authorization is terminated or revoked.  Performed at [facility], [address], [place], [place] [address]   CBC with Differential     Status: Abnormal   Collection Time: 03/20/23  5:21 PM  Result Value Ref Range   WBC 10.7 (H) 4.0 - 10.5 K/uL   RBC 5.17 4.22 - 5.81 MIL/uL   Hemoglobin 16.0 13.0 - 17.0 g/dL   HCT 51.7 60.9 - 47.9 %   MCV 93.2 80.0 - 100.0 fL   MCH 30.9 26.0 - 34.0 pg   MCHC 33.2 30.0 - 36.0 g/dL   RDW 84.8 88.4 - 84.4 %   Platelets 154 150 - 400 K/uL   nRBC 0.0 0.0 - 0.2 %   Neutrophils Relative %  69 %   Neutro Abs 7.4 1.7 - 7.7 K/uL   Lymphocytes Relative 21 %   Lymphs Abs 2.3 0.7 - 4.0 K/uL   Monocytes Relative 8 %   Monocytes Absolute 0.8 0.1 - 1.0 K/uL   Eosinophils Relative 1 %   Eosinophils Absolute 0.1 0.0 - 0.5 K/uL   Basophils Relative 1 %   Basophils Absolute 0.1 0.0 - 0.1 K/uL   Immature Granulocytes 0 %   Abs Immature Granulocytes 0.04 0.00 - 0.07 K/uL    Comment: Performed at [facility], [address] [place], [place] [address]  Comprehensive metabolic panel     Status: Abnormal   Collection Time: 03/20/23  5:21 PM  Result Value Ref Range   Sodium 134 (L) 135 - 145 mmol/L   Potassium 3.3 (L) 3.5 - 5.1 mmol/L   Chloride 92 (L) 98 - 111 mmol/L   CO2 27 22 - 32 mmol/L   Glucose, Bld 108 (H) 70 - 99 mg/dL    Comment: Glucose reference range applies only to samples taken after fasting for at least 8 hours.   BUN 16 8 - 23 mg/dL   Creatinine, Ser 9.09 0.61 - 1.24 mg/dL   Calcium  8.8 (L) 8.9 - 10.3 mg/dL   Total Protein 6.3 (L) 6.5 - 8.1 g/dL   Albumin 3.3 (L) 3.5 - 5.0 g/dL   AST 44 (H) 15 - 41 U/L    ALT 27 0 - 44 U/L   Alkaline Phosphatase 81 38 - 126 U/L   Total Bilirubin 3.7 (H) <1.2 mg/dL   GFR, Estimated >39 >39 mL/min    Comment: (NOTE) Calculated using the CKD-EPI Creatinine Equation (2021)    Anion gap 15 5 - 15    Comment: Performed at [facility], [address] [place], [place] [address]  CK     Status: None   Collection Time: 03/20/23  5:21 PM  Result Value Ref Range   Total CK 354 49 - 397 U/L    Comment: Performed at [facility], [address], [place], [place] [address]  Urinalysis, Routine w reflex microscopic -Urine, Clean Catch     Status: Abnormal   Collection Time: 03/20/23  5:25 PM  Result Value Ref Range   Color, Urine AMBER (A) YELLOW    Comment: BIOCHEMICALS MAY BE AFFECTED BY COLOR   APPearance CLEAR CLEAR   Specific Gravity, Urine 1.019 1.005 - 1.030   pH 5.0 5.0 - 8.0   Glucose, UA NEGATIVE NEGATIVE mg/dL   Hgb urine dipstick NEGATIVE NEGATIVE   Bilirubin Urine NEGATIVE NEGATIVE   Ketones, ur 20 (A) NEGATIVE mg/dL   Protein, ur NEGATIVE NEGATIVE mg/dL   Nitrite NEGATIVE NEGATIVE   Leukocytes,Ua NEGATIVE NEGATIVE    Comment: Performed at [facility], [address] [place], [place] [address]  Lactate dehydrogenase     Status: None   Collection Time: 04/01/23  9:54 AM  Result Value Ref Range   LDH 146 98 - 192 U/L    Comment: Performed at [facility], [address], [place], [place] [address]  Comprehensive metabolic panel     Status: Abnormal   Collection Time: 04/01/23  9:54 AM  Result Value Ref Range   Sodium 140 135 - 145 mmol/L   Potassium 3.8 3.5 - 5.1 mmol/L   Chloride 101 98 - 111 mmol/L   CO2 31 22 - 32 mmol/L   Glucose, Bld 111 (H) 70 - 99 mg/dL    Comment: Glucose reference range  applies only to samples taken after fasting for at least 8 hours.   BUN 12 8 - 23 mg/dL   Creatinine, Ser 9.34 0.61 - 1.24 mg/dL   Calcium  9.1 8.9 - 10.3 mg/dL   Total Protein 6.6 6.5 - 8.1 g/dL   Albumin 3.5 3.5 - 5.0 g/dL   AST 28 15 - 41 U/L    ALT 24 0 - 44 U/L   Alkaline Phosphatase 64 38 - 126 U/L   Total Bilirubin 1.3 (H) <1.2 mg/dL   GFR, Estimated >39 >39 mL/min    Comment: (NOTE) Calculated using the CKD-EPI Creatinine Equation (2021)    Anion gap 8 5 - 15    Comment: Performed at [facility], [address], [place], [place] [address]  CBC with Differential     Status: Abnormal   Collection Time: 04/01/23  9:54 AM  Result Value Ref Range   WBC 9.6 4.0 - 10.5 K/uL   RBC 5.35 4.22 - 5.81 MIL/uL   Hemoglobin 16.6 13.0 - 17.0 g/dL   HCT 48.7 60.9 - 47.9 %   MCV 95.7 80.0 - 100.0 fL   MCH 31.0 26.0 - 34.0 pg   MCHC 32.4 30.0 - 36.0 g/dL   RDW 84.1 (H) 88.4 - 84.4 %   Platelets 257 150 - 400 K/uL   nRBC 0.0 0.0 - 0.2 %   Neutrophils Relative % 63 %   Neutro Abs 6.0 1.7 - 7.7 K/uL   Lymphocytes Relative 30 %   Lymphs Abs 2.9 0.7 - 4.0 K/uL   Monocytes Relative 5 %   Monocytes Absolute 0.5 0.1 - 1.0 K/uL   Eosinophils Relative 1 %   Eosinophils Absolute 0.1 0.0 - 0.5 K/uL   Basophils Relative 1 %   Basophils Absolute 0.1 0.0 - 0.1 K/uL   Immature Granulocytes 0 %   Abs Immature Granulocytes 0.03 0.00 - 0.07 K/uL    Comment: Performed at [facility], [address], [place], [place] [address]  TSH     Status: None   Collection Time: 04/11/23  1:31 PM  Result Value Ref Range   TSH 1.870 0.450 - 4.500 uIU/mL  T4, free     Status: None   Collection Time: 04/11/23  1:31 PM  Result Value Ref Range   Free T4 1.32 0.82 - 1.77 ng/dL  Lipid panel     Status: None   Collection Time: 04/11/23  1:31 PM  Result Value Ref Range   Cholesterol, Total 125 100 - 199 mg/dL   Triglycerides 892 0 - 149 mg/dL   HDL 45 >60 mg/dL   VLDL Cholesterol Cal 20 5 - 40 mg/dL   LDL Chol Calc (NIH) 60 0 - 99 mg/dL   Chol/HDL Ratio 2.8 0.0 - 5.0 ratio    Comment:                                   T. Chol/HDL Ratio                                             Men  Women                               1/2 Avg.Risk  3.4  3.3                                    Avg.Risk  5.0    4.4                                2X Avg.Risk  9.6    7.1                                3X Avg.Risk 23.4   11.0   Urinalysis, Routine w reflex microscopic     Status: None   Collection Time: 05/09/23  1:55 PM  Result Value Ref Range   Specific Gravity, UA 1.015 1.005 - 1.030   pH, UA 6.5 5.0 - 7.5   Color, UA Yellow Yellow   Appearance Ur Clear Clear   Leukocytes,UA Negative Negative   Protein,UA Negative Negative/Trace   Glucose, UA Negative Negative   Ketones, UA Negative Negative   RBC, UA Negative Negative   Bilirubin, UA Negative Negative   Urobilinogen, Ur 0.2 0.2 - 1.0 mg/dL   Nitrite, UA Negative Negative   Microscopic Examination Comment     Comment: Microscopic not indicated and not performed.    BMET No results for input(s): NA, K, CL, CO2, GLUCOSE, BUN, CREATININE, CALCIUM  in the last 72 hours. PT/INR No results for input(s): LABPROT, INR in the last 72 hours. ABG No results for input(s): PHART, HCO3 in the last 72 hours.  Invalid input(s): PCO2, PO2 UA is clear.. Studies/Results: CT ABDOMEN PELVIS W CONTRAST Result Date: 03/20/2023 CLINICAL DATA:  Left-sided abdominal pain and flank pain. EXAM: CT ABDOMEN AND PELVIS WITH CONTRAST TECHNIQUE: Multidetector CT imaging of the abdomen and pelvis was performed using the standard protocol following bolus administration of intravenous contrast. RADIATION DOSE REDUCTION: This exam was performed according to the departmental dose-optimization program which includes automated exposure control, adjustment of the mA and/or kV according to patient size and/or use of iterative reconstruction technique. CONTRAST:  OMNIPAQUE  IOHEXOL  300 MG/ML  SOLN COMPARISON:  CT abdomen and pelvis 07/06/2022 FINDINGS: Lower chest: There is airspace disease in the left lower lobe. Hepatobiliary: No focal liver abnormality is seen. Status post cholecystectomy. No biliary  dilatation. Pancreas: Unremarkable. No pancreatic ductal dilatation or surrounding inflammatory changes. Spleen: Normal in size without focal abnormality. Adrenals/Urinary Tract: Right superior pole cyst measures 1.5 cm. There is no hydronephrosis or perinephric fluid. The adrenal glands and bladder are within normal limits. Stomach/Bowel: Stomach is within normal limits. No evidence of bowel wall thickening, distention, or inflammatory changes. There is sigmoid colon diverticulosis. The appendix is not seen. Vascular/Lymphatic: Aortic atherosclerosis. No enlarged abdominal or pelvic lymph nodes. Reproductive: Prostate gland is mildly enlarged. Other: No abdominal wall hernia or abnormality. No abdominopelvic ascites. Musculoskeletal: Bones are diffusely osteopenic. IMPRESSION: 1. No acute localizing process in the abdomen or pelvis. 2. Left lower lobe airspace disease worrisome for pneumonia. 3. Sigmoid colon diverticulosis. 4. Aortic atherosclerosis. Aortic Atherosclerosis (ICD10-I70.0). Electronically Signed   By: Greig Pique M.D.   On: 03/20/2023 21:20   DG Chest Portable 1 View Result Date: 03/20/2023 CLINICAL DATA:  Weakness. EXAM: PORTABLE CHEST 1 VIEW COMPARISON:  March 17, 2023. FINDINGS: Stable cardiomegaly. Left-sided pacemaker is unchanged. Right internal jugular Port-A-Cath is unchanged. Minimal bibasilar subsegmental atelectasis is noted.  IMPRESSION: Minimal bibasilar subsegmental atelectasis. Electronically Signed   By: Lynwood Landy Raddle M.D.   On: 03/20/2023 19:50   DG Chest Portable 1 View Result Date: 03/17/2023 CLINICAL DATA:  Shortness of breath. EXAM: PORTABLE CHEST 1 VIEW COMPARISON:  Sep 08, 2022. FINDINGS: Stable cardiomegaly. Left-sided pacemaker is unchanged in position. Right internal jugular Port-A-Cath is unchanged. Both lungs are clear. The visualized skeletal structures are unremarkable. IMPRESSION: No active disease. Electronically Signed   By: Lynwood Landy Raddle M.D.   On:  03/17/2023 13:57   CUP PACEART REMOTE DEVICE CHECK Result Date: 03/11/2023 Scheduled remote reviewed. Normal device function.  Known permanent AF, on OAC, good ventricular rate control Next remote 91 days. ML, CVRS   Urodynamics reviewed.   Assessment/Plan: BPH with UUI.   He is doing well on alfuzosin   with decreased frequency, nocturia and incontinence.  He couldn't tolerate Myrebtriq.  I refilled the alfuzosin  and will have him return in a year.    History of UTI's.  HIs urine is clear today. .   Meds ordered this encounter  Medications   alfuzosin  (UROXATRAL ) 10 MG 24 hr tablet    Sig: TAKE ONE TABLET DAILY WITH BREAKFAST    Dispense:  90 tablet    Refill:  3     Orders Placed This Encounter  Procedures   Urinalysis, Routine w reflex microscopic   BLADDER SCAN AMB NON-IMAGING     Return in about 1 year (around 05/08/2024).    CC: Comer Reid NP.      Norleen Seltzer 05/10/2023 [phone] ID: [name], male   DOB: June 22, 1950, 73 y.o.   MRN: [id number] Patient ID: WYAT INFINGER, male   DOB: Dec 24, 1950, 73 y.o.   MRN: [id number] Patient ID: KAMERIN GRUMBINE, male   DOB: 1950-08-21, 73 y.o.   MRN: [id number]

## 2023-05-24 ENCOUNTER — Inpatient Hospital Stay (HOSPITAL_COMMUNITY): Payer: 59

## 2023-05-24 ENCOUNTER — Encounter (HOSPITAL_COMMUNITY): Payer: Self-pay

## 2023-05-24 ENCOUNTER — Emergency Department (HOSPITAL_COMMUNITY): Payer: 59

## 2023-05-24 ENCOUNTER — Inpatient Hospital Stay (HOSPITAL_COMMUNITY)
Admission: EM | Admit: 2023-05-24 | Discharge: 2023-06-06 | DRG: 516 | Disposition: A | Discharge status: Skilled Nursing Facility | Payer: 59 | Attending: Internal Medicine | Admitting: Internal Medicine

## 2023-05-24 ENCOUNTER — Other Ambulatory Visit: Payer: Self-pay

## 2023-05-24 DIAGNOSIS — D72829 Elevated white blood cell count, unspecified: Secondary | ICD-10-CM | POA: Diagnosis present

## 2023-05-24 DIAGNOSIS — S22088A Other fracture of T11-T12 vertebra, initial encounter for closed fracture: Secondary | ICD-10-CM | POA: Diagnosis not present

## 2023-05-24 DIAGNOSIS — N400 Enlarged prostate without lower urinary tract symptoms: Secondary | ICD-10-CM | POA: Diagnosis present

## 2023-05-24 DIAGNOSIS — Z7901 Long term (current) use of anticoagulants: Secondary | ICD-10-CM | POA: Diagnosis not present

## 2023-05-24 DIAGNOSIS — Z1152 Encounter for screening for COVID-19: Secondary | ICD-10-CM

## 2023-05-24 DIAGNOSIS — Z91048 Other nonmedicinal substance allergy status: Secondary | ICD-10-CM

## 2023-05-24 DIAGNOSIS — G629 Polyneuropathy, unspecified: Secondary | ICD-10-CM | POA: Diagnosis present

## 2023-05-24 DIAGNOSIS — M454 Ankylosing spondylitis of thoracic region: Secondary | ICD-10-CM | POA: Diagnosis present

## 2023-05-24 DIAGNOSIS — J9601 Acute respiratory failure with hypoxia: Secondary | ICD-10-CM | POA: Diagnosis not present

## 2023-05-24 DIAGNOSIS — S22089S Unspecified fracture of T11-T12 vertebra, sequela: Secondary | ICD-10-CM | POA: Diagnosis not present

## 2023-05-24 DIAGNOSIS — Z8349 Family history of other endocrine, nutritional and metabolic diseases: Secondary | ICD-10-CM

## 2023-05-24 DIAGNOSIS — S22082A Unstable burst fracture of T11-T12 vertebra, initial encounter for closed fracture: Secondary | ICD-10-CM | POA: Diagnosis present

## 2023-05-24 DIAGNOSIS — Z6841 Body Mass Index (BMI) 40.0 and over, adult: Secondary | ICD-10-CM

## 2023-05-24 DIAGNOSIS — Z950 Presence of cardiac pacemaker: Secondary | ICD-10-CM | POA: Diagnosis not present

## 2023-05-24 DIAGNOSIS — E559 Vitamin D deficiency, unspecified: Secondary | ICD-10-CM | POA: Diagnosis present

## 2023-05-24 DIAGNOSIS — M4314 Spondylolisthesis, thoracic region: Secondary | ICD-10-CM | POA: Diagnosis present

## 2023-05-24 DIAGNOSIS — Z8572 Personal history of non-Hodgkin lymphomas: Secondary | ICD-10-CM

## 2023-05-24 DIAGNOSIS — I482 Chronic atrial fibrillation, unspecified: Secondary | ICD-10-CM | POA: Diagnosis present

## 2023-05-24 DIAGNOSIS — E876 Hypokalemia: Secondary | ICD-10-CM | POA: Diagnosis not present

## 2023-05-24 DIAGNOSIS — W010XXA Fall on same level from slipping, tripping and stumbling without subsequent striking against object, initial encounter: Secondary | ICD-10-CM | POA: Diagnosis present

## 2023-05-24 DIAGNOSIS — W1830XA Fall on same level, unspecified, initial encounter: Secondary | ICD-10-CM | POA: Diagnosis not present

## 2023-05-24 DIAGNOSIS — S22089A Unspecified fracture of T11-T12 vertebra, initial encounter for closed fracture: Secondary | ICD-10-CM | POA: Diagnosis not present

## 2023-05-24 DIAGNOSIS — K567 Ileus, unspecified: Secondary | ICD-10-CM | POA: Diagnosis not present

## 2023-05-24 DIAGNOSIS — Z7952 Long term (current) use of systemic steroids: Secondary | ICD-10-CM

## 2023-05-24 DIAGNOSIS — I10 Essential (primary) hypertension: Secondary | ICD-10-CM | POA: Diagnosis present

## 2023-05-24 DIAGNOSIS — E785 Hyperlipidemia, unspecified: Secondary | ICD-10-CM | POA: Diagnosis not present

## 2023-05-24 DIAGNOSIS — E7800 Pure hypercholesterolemia, unspecified: Secondary | ICD-10-CM | POA: Diagnosis present

## 2023-05-24 DIAGNOSIS — Z8249 Family history of ischemic heart disease and other diseases of the circulatory system: Secondary | ICD-10-CM

## 2023-05-24 DIAGNOSIS — E871 Hypo-osmolality and hyponatremia: Secondary | ICD-10-CM | POA: Diagnosis present

## 2023-05-24 DIAGNOSIS — K219 Gastro-esophageal reflux disease without esophagitis: Secondary | ICD-10-CM | POA: Diagnosis present

## 2023-05-24 DIAGNOSIS — I509 Heart failure, unspecified: Secondary | ICD-10-CM | POA: Diagnosis not present

## 2023-05-24 DIAGNOSIS — E271 Primary adrenocortical insufficiency: Secondary | ICD-10-CM | POA: Diagnosis present

## 2023-05-24 DIAGNOSIS — J90 Pleural effusion, not elsewhere classified: Secondary | ICD-10-CM | POA: Diagnosis present

## 2023-05-24 DIAGNOSIS — J9811 Atelectasis: Secondary | ICD-10-CM | POA: Diagnosis present

## 2023-05-24 DIAGNOSIS — M450 Ankylosing spondylitis of multiple sites in spine: Secondary | ICD-10-CM

## 2023-05-24 DIAGNOSIS — I480 Paroxysmal atrial fibrillation: Secondary | ICD-10-CM | POA: Diagnosis present

## 2023-05-24 DIAGNOSIS — E861 Hypovolemia: Secondary | ICD-10-CM | POA: Diagnosis not present

## 2023-05-24 DIAGNOSIS — E039 Hypothyroidism, unspecified: Secondary | ICD-10-CM | POA: Diagnosis present

## 2023-05-24 DIAGNOSIS — Z79890 Hormone replacement therapy (postmenopausal): Secondary | ICD-10-CM

## 2023-05-24 DIAGNOSIS — K5900 Constipation, unspecified: Secondary | ICD-10-CM | POA: Diagnosis not present

## 2023-05-24 DIAGNOSIS — E66813 Obesity, class 3: Secondary | ICD-10-CM | POA: Diagnosis present

## 2023-05-24 DIAGNOSIS — M459 Ankylosing spondylitis of unspecified sites in spine: Secondary | ICD-10-CM | POA: Diagnosis not present

## 2023-05-24 DIAGNOSIS — R0902 Hypoxemia: Secondary | ICD-10-CM | POA: Diagnosis present

## 2023-05-24 DIAGNOSIS — Z79899 Other long term (current) drug therapy: Secondary | ICD-10-CM

## 2023-05-24 DIAGNOSIS — Z882 Allergy status to sulfonamides status: Secondary | ICD-10-CM

## 2023-05-24 DIAGNOSIS — I110 Hypertensive heart disease with heart failure: Secondary | ICD-10-CM | POA: Diagnosis not present

## 2023-05-24 LAB — CBC WITH DIFFERENTIAL/PLATELET
Abs Immature Granulocytes: 0.06 10*3/uL (ref 0.00–0.07)
Basophils Absolute: 0.1 10*3/uL (ref 0.0–0.1)
Basophils Relative: 1 %
Eosinophils Absolute: 0 10*3/uL (ref 0.0–0.5)
Eosinophils Relative: 0 %
HCT: 48.6 % (ref 39.0–52.0)
Hemoglobin: 15.6 g/dL (ref 13.0–17.0)
Immature Granulocytes: 1 %
Lymphocytes Relative: 11 %
Lymphs Abs: 1.2 10*3/uL (ref 0.7–4.0)
MCH: 30.9 pg (ref 26.0–34.0)
MCHC: 32.1 g/dL (ref 30.0–36.0)
MCV: 96.2 fL (ref 80.0–100.0)
Monocytes Absolute: 0.7 10*3/uL (ref 0.1–1.0)
Monocytes Relative: 6 %
Neutro Abs: 9.1 10*3/uL — ABNORMAL HIGH (ref 1.7–7.7)
Neutrophils Relative %: 81 %
Platelets: 187 10*3/uL (ref 150–400)
RBC: 5.05 MIL/uL (ref 4.22–5.81)
RDW: 14.6 % (ref 11.5–15.5)
WBC: 11.1 10*3/uL — ABNORMAL HIGH (ref 4.0–10.5)
nRBC: 0 % (ref 0.0–0.2)

## 2023-05-24 LAB — COMPREHENSIVE METABOLIC PANEL
ALT: 16 U/L (ref 0–44)
AST: 23 U/L (ref 15–41)
Albumin: 3.6 g/dL (ref 3.5–5.0)
Alkaline Phosphatase: 64 U/L (ref 38–126)
Anion gap: 8 (ref 5–15)
BUN: 16 mg/dL (ref 8–23)
CO2: 30 mmol/L (ref 22–32)
Calcium: 9.2 mg/dL (ref 8.9–10.3)
Chloride: 99 mmol/L (ref 98–111)
Creatinine, Ser: 0.69 mg/dL (ref 0.61–1.24)
GFR, Estimated: >60 mL/min (ref >60)
Glucose, Bld: 138 mg/dL — ABNORMAL HIGH (ref 70–99)
Potassium: 4.3 mmol/L (ref 3.5–5.1)
Sodium: 137 mmol/L (ref 135–145)
Total Bilirubin: 4.1 mg/dL — ABNORMAL HIGH (ref 0.0–1.2)
Total Protein: 6.8 g/dL (ref 6.5–8.1)

## 2023-05-24 MED ORDER — ONDANSETRON HCL 4 MG/2ML IJ SOLN: 4.0000 mg | Freq: Four times a day (QID) | INTRAMUSCULAR | Status: DC | PRN

## 2023-05-24 MED ORDER — ROSUVASTATIN CALCIUM 5 MG PO TABS
5.0000 mg | ORAL_TABLET | Freq: Every day | ORAL | Status: DC
  Administered 2023-05-25 – 2023-06-05 (×13): 5 mg via ORAL
  Filled 2023-05-24 (×13): qty 1

## 2023-05-24 MED ORDER — OXYCODONE-ACETAMINOPHEN 5-325 MG PO TABS
1.0000 | ORAL_TABLET | Freq: Once | ORAL | Status: AC
  Administered 2023-05-24: 1 via ORAL
  Filled 2023-05-24: qty 1

## 2023-05-24 MED ORDER — FLUDROCORTISONE ACETATE 0.1 MG PO TABS
0.1000 mg | ORAL_TABLET | Freq: Every day | ORAL | Status: DC
  Administered 2023-05-25 – 2023-06-06 (×12): 0.1 mg via ORAL
  Filled 2023-05-24 (×15): qty 1

## 2023-05-24 MED ORDER — FUROSEMIDE 40 MG PO TABS
40.0000 mg | ORAL_TABLET | Freq: Every day | ORAL | Status: DC
  Administered 2023-05-25 – 2023-05-30 (×6): 40 mg via ORAL
  Filled 2023-05-24 (×6): qty 1

## 2023-05-24 MED ORDER — ACETAMINOPHEN 500 MG PO TABS
1000.0000 mg | ORAL_TABLET | Freq: Four times a day (QID) | ORAL | Status: DC | PRN
  Administered 2023-05-25: 1000 mg via ORAL
  Filled 2023-05-24 (×2): qty 2

## 2023-05-24 MED ORDER — LEVOTHYROXINE SODIUM 100 MCG PO TABS
100.0000 ug | ORAL_TABLET | Freq: Every day | ORAL | Status: DC
  Administered 2023-05-25 – 2023-06-06 (×13): 100 ug via ORAL
  Filled 2023-05-24 (×9): qty 1
  Filled 2023-05-24: qty 2
  Filled 2023-05-24 (×3): qty 1

## 2023-05-24 MED ORDER — VALACYCLOVIR HCL 500 MG PO TABS
1000.0000 mg | ORAL_TABLET | Freq: Every day | ORAL | Status: DC
  Administered 2023-05-25 – 2023-06-06 (×13): 1000 mg via ORAL
  Filled 2023-05-24 (×13): qty 2

## 2023-05-24 MED ORDER — CALCIUM CARBONATE 1250 (500 CA) MG PO TABS
1.0000 | ORAL_TABLET | Freq: Two times a day (BID) | ORAL | Status: DC
  Administered 2023-05-25 – 2023-06-06 (×23): 1250 mg via ORAL
  Filled 2023-05-24 (×31): qty 1

## 2023-05-24 MED ORDER — VITAMIN D 25 MCG (1000 UNIT) PO TABS
1000.0000 [IU] | ORAL_TABLET | Freq: Every day | ORAL | Status: DC
  Administered 2023-05-25: 1000 [IU] via ORAL
  Filled 2023-05-24: qty 1

## 2023-05-24 MED ORDER — POLYETHYLENE GLYCOL 3350 17 G PO PACK
17.0000 g | PACK | Freq: Every day | ORAL | Status: DC | PRN
  Administered 2023-05-28 – 2023-06-01 (×5): 17 g via ORAL
  Filled 2023-05-24 (×5): qty 1

## 2023-05-24 MED ORDER — OXYCODONE HCL 5 MG PO TABS
5.0000 mg | ORAL_TABLET | ORAL | Status: DC | PRN
  Administered 2023-05-25 – 2023-05-27 (×5): 5 mg via ORAL
  Filled 2023-05-24 (×7): qty 1

## 2023-05-24 MED ORDER — MELATONIN 3 MG PO TABS
6.0000 mg | ORAL_TABLET | Freq: Every evening | ORAL | Status: DC | PRN
  Administered 2023-05-26 – 2023-06-03 (×7): 6 mg via ORAL
  Filled 2023-05-24 (×7): qty 2

## 2023-05-24 MED ORDER — HYDROMORPHONE HCL 1 MG/ML IJ SOLN
0.5000 mg | INTRAMUSCULAR | Status: DC | PRN
  Administered 2023-05-25: 0.5 mg via INTRAVENOUS
  Filled 2023-05-24: qty 0.5

## 2023-05-24 MED ORDER — MAGNESIUM OXIDE -MG SUPPLEMENT 400 (240 MG) MG PO TABS
400.0000 mg | ORAL_TABLET | Freq: Every day | ORAL | Status: DC
  Administered 2023-05-25 – 2023-06-06 (×13): 400 mg via ORAL
  Filled 2023-05-24 (×13): qty 1

## 2023-05-24 MED ORDER — OXYCODONE HCL 5 MG PO TABS
2.5000 mg | ORAL_TABLET | ORAL | Status: DC | PRN
  Administered 2023-05-24 – 2023-05-26 (×2): 2.5 mg via ORAL
  Filled 2023-05-24 (×2): qty 1

## 2023-05-24 MED ORDER — ALBUTEROL SULFATE (2.5 MG/3ML) 0.083% IN NEBU
3.0000 mL | INHALATION_SOLUTION | Freq: Four times a day (QID) | RESPIRATORY_TRACT | Status: DC | PRN
  Administered 2023-06-04: 3 mL via RESPIRATORY_TRACT
  Filled 2023-05-24: qty 3

## 2023-05-24 MED ORDER — PREDNISONE 5 MG PO TABS
10.0000 mg | ORAL_TABLET | Freq: Every day | ORAL | Status: DC
  Administered 2023-05-25 – 2023-06-06 (×13): 10 mg via ORAL
  Filled 2023-05-24 (×12): qty 2
  Filled 2023-05-24: qty 1

## 2023-05-24 MED ORDER — VITAMIN B-12 1000 MCG PO TABS
1000.0000 ug | ORAL_TABLET | Freq: Every day | ORAL | Status: DC
  Administered 2023-05-25 – 2023-06-06 (×13): 1000 ug via ORAL
  Filled 2023-05-24 (×13): qty 1

## 2023-05-24 MED ORDER — PANTOPRAZOLE SODIUM 40 MG PO TBEC
40.0000 mg | DELAYED_RELEASE_TABLET | Freq: Every day | ORAL | Status: DC
  Administered 2023-05-25 – 2023-06-06 (×13): 40 mg via ORAL
  Filled 2023-05-24 (×13): qty 1

## 2023-05-24 MED ORDER — PREGABALIN 100 MG PO CAPS
200.0000 mg | ORAL_CAPSULE | Freq: Two times a day (BID) | ORAL | Status: DC
  Administered 2023-05-25 – 2023-06-06 (×26): 200 mg via ORAL
  Filled 2023-05-24 (×3): qty 2
  Filled 2023-05-24: qty 1
  Filled 2023-05-24 (×6): qty 2
  Filled 2023-05-24: qty 1
  Filled 2023-05-24 (×4): qty 2
  Filled 2023-05-24: qty 4
  Filled 2023-05-24 (×10): qty 2

## 2023-05-24 MED ORDER — FERROUS SULFATE 325 (65 FE) MG PO TABS
325.0000 mg | ORAL_TABLET | ORAL | Status: DC
  Administered 2023-05-25 – 2023-06-06 (×7): 325 mg via ORAL
  Filled 2023-05-24 (×9): qty 1

## 2023-05-24 MED ORDER — SODIUM CHLORIDE 0.9% FLUSH
3.0000 mL | Freq: Two times a day (BID) | INTRAVENOUS | Status: DC
  Administered 2023-05-25 – 2023-06-06 (×20): 3 mL via INTRAVENOUS

## 2023-05-24 MED ORDER — ALFUZOSIN HCL ER 10 MG PO TB24
10.0000 mg | ORAL_TABLET | Freq: Every day | ORAL | Status: DC
  Administered 2023-05-25 – 2023-06-06 (×13): 10 mg via ORAL
  Filled 2023-05-24 (×14): qty 1

## 2023-05-24 NOTE — ED Triage Notes (Signed)
Pt BIB [facility] due to fall yesterday that resulted in loud pop in lower back this morning when he stood up. Pt unable to stand or walk at this time

## 2023-05-24 NOTE — Progress Notes (Signed)
Reviewed Ct scans. This is an unstable chance fracture of T11-T12. Please keep patient completely flat in bed and only log roll. He is on eliquis so please hold all blood thinners. He will need surgical stabilization but he will have to be off of eliquis for 3 days. Will need an MRI thoracic spine when he gets to [facility]. Will consult once he is here at [facility].

## 2023-05-24 NOTE — ED Notes (Signed)
Pt requesting something to eat and drink. Pt given [place] sandwich and shasta.

## 2023-05-24 NOTE — ED Provider Notes (Signed)
[place] EMERGENCY DEPARTMENT AT [facility] Provider Note   CSN: [id number] Arrival date & time: 05/24/23  1525     History {Add pertinent medical, surgical, social history, OB history to HPI:1} Chief Complaint  Patient presents with   Back Pain   Fall    Roy [name] is a 73 y.o. male.  He has a history of B-cell lymphoma, A-fib on anticoagulation.  He said he fell yesterday, slipped climbing up into his truck.  Landed on his back.  Did not lose consciousness did not hit his head.  He said he was doing okay for the rest of the day when he was getting up out of his chair he felt a pop in his back and has had significant pain since then.  Unable to stand or ambulate secondary to pain.  Brought in by EMS from home.  Just saw neurology for peripheral neuropathy pain.  The history is provided by the patient.  Back Pain Location:  Thoracic spine and lumbar spine Quality:  Aching and stabbing Pain severity:  Severe Onset quality:  Sudden Duration:  1 day Timing:  Constant Progression:  Unchanged Chronicity:  New Context: falling   Relieved by:  Nothing Worsened by:  Movement Ineffective treatments:  Bed rest Associated symptoms: no abdominal pain, no bladder incontinence, no bowel incontinence, no chest pain, no dysuria and no fever   Risk factors: hx of cancer   Fall Pertinent negatives include no chest pain, no abdominal pain and no shortness of breath.       Home Medications Prior to Admission medications   Medication Sig Start Date End Date Taking? Authorizing Provider  pregabalin (LYRICA) 200 MG capsule Take 1 capsule by mouth 2 (two) times daily. 05/22/23  Yes [provider]  acetaminophen (TYLENOL) 325 MG tablet Take 2 tablets (650 mg total) by mouth every 6 (six) hours as needed for headache. 01/13/21   Mariea Clonts, Courage, MD  albuterol (VENTOLIN HFA) 108 (90 Base) MCG/ACT inhaler Inhale 2 puffs into the lungs every 6 (six) hours as needed for  wheezing or shortness of breath. 05/15/23   [provider]  alfuzosin (UROXATRAL) 10 MG 24 hr tablet TAKE ONE TABLET DAILY WITH BREAKFAST 05/09/23   Bjorn Pippin, MD  cyclobenzaprine (FLEXERIL) 5 MG tablet Take 5 mg by mouth 3 (three) times daily as needed for muscle spasms. 05/24/23 06/03/23  [provider]  ELIQUIS 5 MG TABS tablet Take 1 tablet (5 mg total) by mouth 2 (two) times daily. Resume from may 9 09/13/22   Lanae Boast, MD  EQUATE STOOL SOFTENER 100 MG capsule Take 2 capsules by mouth twice daily 08/13/21   Carnella Guadalajara, PA-C  FEROSUL 325 (65 Fe) MG tablet Take 1 tablet by mouth once daily with breakfast 07/30/21   Rojelio Brenner M, PA-C  fludrocortisone (FLORINEF) 0.1 MG tablet TAKE ONE TABLET ONCE DAILY WITH BREAKFAST 12/11/22   Roma Kayser, MD  furosemide (LASIX) 40 MG tablet Take 1 tablet (40 mg total) by mouth daily. 12/25/22   Camnitz, Will Daphine Deutscher, MD  levocetirizine (XYZAL) 5 MG tablet Take 5 mg by mouth every evening. 08/30/22   [provider]  levothyroxine (SYNTHROID) 100 MCG tablet TAKE ONE TABLET ONCE DAILY BEFORE BREAKFAST 12/11/22   Roma Kayser, MD  magnesium oxide (MAG-OX) 400 (240 Mg) MG tablet Take 400 mg by mouth daily.    [provider]  omeprazole (PRILOSEC) 10 MG capsule Take 20 mg by mouth daily.  10/26/21   [provider]  potassium chloride (KLOR-CON) 10 MEQ tablet TAKE TWO TABLETS TWICE DAILY 12/11/22   Roma Kayser, MD  predniSONE (DELTASONE) 10 MG tablet TAKE ONE TABLET ONCE DAILY WITH BREAKFAST 12/11/22   Roma Kayser, MD  rosuvastatin (CRESTOR) 5 MG tablet Take 1 tablet by mouth at bedtime. 08/23/20   [provider]  valACYclovir (VALTREX) 1000 MG tablet Take 1,000 mg by mouth daily.    [provider]  vitamin B-12 (CYANOCOBALAMIN) 1000 MCG tablet Take 1 tablet (1,000 mcg total) by mouth daily. 02/28/21   Carnella Guadalajara, PA-C      Allergies    Adhesive  [tape] and Sulfa antibiotics    Review of Systems   Review of Systems  Constitutional:  Negative for fever.  Eyes:  Negative for visual disturbance.  Respiratory:  Negative for shortness of breath.   Cardiovascular:  Negative for chest pain.  Gastrointestinal:  Negative for abdominal pain and bowel incontinence.  Genitourinary:  Negative for bladder incontinence and dysuria.  Musculoskeletal:  Positive for back pain.    Physical Exam Updated Vital Signs BP (!) 145/82 (BP Location: Right Arm)   Pulse 68   Temp 98.8 F (37.1 C) (Oral)   Resp 18   Ht 6' (1.829 m)   Wt 135.2 kg   SpO2 93%   BMI 40.42 kg/m  Physical Exam Vitals and nursing note reviewed.  Constitutional:      General: He is not in acute distress.    Appearance: Normal appearance. He is well-developed. He is obese.  HENT:     Head: Normocephalic and atraumatic.  Eyes:     Conjunctiva/sclera: Conjunctivae normal.  Cardiovascular:     Rate and Rhythm: Normal rate and regular rhythm.     Heart sounds: No murmur heard. Pulmonary:     Effort: Pulmonary effort is normal. No respiratory distress.     Breath sounds: Normal breath sounds.  Abdominal:     Palpations: Abdomen is soft.     Tenderness: There is no abdominal tenderness. There is no guarding or rebound.  Musculoskeletal:        General: No deformity.     Cervical back: Neck supple.  Skin:    General: Skin is warm and dry.     Capillary Refill: Capillary refill takes less than 2 seconds.  Neurological:     Mental Status: He is alert and oriented to person, place, and time.     Cranial Nerves: No cranial nerve deficit.     Motor: No weakness.     ED Results / Procedures / Treatments   Labs (all labs ordered are listed, but only abnormal results are displayed) Labs Reviewed  COMPREHENSIVE METABOLIC PANEL  CBC  CBC WITH DIFFERENTIAL/PLATELET    EKG None  Radiology DG Thoracic Spine 2 View Result Date: 05/24/2023 CLINICAL DATA:  Fall.  EXAM: THORACIC SPINE 2 VIEWS COMPARISON:  Chest radiographs 09/08/2022. CT abdomen and pelvis 03/20/2023. FINDINGS: There is diffuse thoracic spine ankylosis. There is new slight anterolisthesis of a lower thoracic vertebral body with a suspected acute fracture extending through the intervening disc space and posterior elements with mild distraction. A Port-A-Cath, pacemaker, and cholecystectomy clips are noted. IMPRESSION: Ankylosing spondylitis with acute chalkstick fracture near the thoracolumbar junction. CT of the thoracic and lumbar spine is recommended for further evaluation. Electronically Signed   By: Sebastian Ache M.D.   On: 05/24/2023 16:40    Procedures Procedures  {Document cardiac monitor,  telemetry assessment procedure when appropriate:1}  Medications Ordered in ED Medications  oxyCODONE-acetaminophen (PERCOCET/ROXICET) 5-325 MG per tablet 1 tablet (has no administration in time range)    ED Course/ Medical Decision Making/ A&P   {   Click here for ABCD2, HEART and other calculatorsREFRESH Note before signing :1}                              Medical Decision Making Amount and/or Complexity of Data Reviewed Labs: ordered. Radiology: ordered.  Risk Prescription drug management.   This patient complains of ***; this involves an extensive number of treatment Options and is a complaint that carries with it a high risk of complications and morbidity. The differential includes ***  I ordered, reviewed and interpreted labs, which included *** I ordered medication *** and reviewed PMP when indicated. I ordered imaging studies which included *** and I independently    visualized and interpreted imaging which showed *** Additional history obtained from *** Previous records obtained and reviewed *** I consulted *** and discussed lab and imaging findings and discussed disposition.  Cardiac monitoring reviewed, *** Social determinants considered, *** Critical Interventions:  ***  After the interventions stated above, I reevaluated the patient and found *** Admission and further testing considered, ***   {Document critical care time when appropriate:1} {Document review of labs and clinical decision tools ie heart score, Chads2Vasc2 etc:1}  {Document your independent review of radiology images, and any outside records:1} {Document your discussion with family members, caretakers, and with consultants:1} {Document social determinants of health affecting pt's care:1} {Document your decision making why or why not admission, treatments were needed:1} Final Clinical Impression(s) / ED Diagnoses Final diagnoses:  None    Rx / DC Orders ED Discharge Orders     None

## 2023-05-25 DIAGNOSIS — S22088A Other fracture of T11-T12 vertebra, initial encounter for closed fracture: Secondary | ICD-10-CM | POA: Diagnosis not present

## 2023-05-25 LAB — BASIC METABOLIC PANEL
Anion gap: 12 (ref 5–15)
BUN: 15 mg/dL (ref 8–23)
CO2: 27 mmol/L (ref 22–32)
Calcium: 9 mg/dL (ref 8.9–10.3)
Chloride: 98 mmol/L (ref 98–111)
Creatinine, Ser: 0.63 mg/dL (ref 0.61–1.24)
GFR, Estimated: >60 mL/min (ref >60)
Glucose, Bld: 121 mg/dL — ABNORMAL HIGH (ref 70–99)
Potassium: 3.5 mmol/L (ref 3.5–5.1)
Sodium: 137 mmol/L (ref 135–145)

## 2023-05-25 LAB — CBC
HCT: 46.9 % (ref 39.0–52.0)
Hemoglobin: 15.3 g/dL (ref 13.0–17.0)
MCH: 31.2 pg (ref 26.0–34.0)
MCHC: 32.6 g/dL (ref 30.0–36.0)
MCV: 95.7 fL (ref 80.0–100.0)
Platelets: 184 10*3/uL (ref 150–400)
RBC: 4.9 MIL/uL (ref 4.22–5.81)
RDW: 14.6 % (ref 11.5–15.5)
WBC: 12.2 10*3/uL — ABNORMAL HIGH (ref 4.0–10.5)
nRBC: 0 % (ref 0.0–0.2)

## 2023-05-25 LAB — RESP PANEL BY RT-PCR (RSV, FLU A&B, COVID)  RVPGX2
Influenza A by PCR: NEGATIVE
Influenza B by PCR: NEGATIVE
Resp Syncytial Virus by PCR: NEGATIVE
SARS Coronavirus 2 by RT PCR: NEGATIVE

## 2023-05-25 LAB — MAGNESIUM: Magnesium: 1.9 mg/dL (ref 1.7–2.4)

## 2023-05-25 LAB — PHOSPHORUS: Phosphorus: 3 mg/dL (ref 2.5–4.6)

## 2023-05-25 LAB — VITAMIN D 25 HYDROXY (VIT D DEFICIENCY, FRACTURES): Vit D, 25-Hydroxy: 20.35 ng/mL — ABNORMAL LOW (ref 30–100)

## 2023-05-25 MED ORDER — VITAMIN D (ERGOCALCIFEROL) 1.25 MG (50000 UNIT) PO CAPS
50000.0000 [IU] | ORAL_CAPSULE | ORAL | Status: DC
  Administered 2023-05-25 – 2023-06-01 (×2): 50000 [IU] via ORAL
  Filled 2023-05-25 (×2): qty 1

## 2023-05-25 NOTE — ED Notes (Signed)
Pt placed in hospital bed and positioned to comfort

## 2023-05-25 NOTE — H&P (Addendum)
History and Physical    Roy [name] URK:[id number] DOB: 01/18/1951 DOA: 05/24/2023  PCP: Rebecka Apley, NP   Patient coming from: Home   Chief Complaint:  Chief Complaint  Patient presents with   Back Pain   Fall    HPI:  Roy [name] is a 73 y.o. male with hx of primary adrenal insufficiency on chronic glucocorticoid/mineralocorticoid, ankylosing spondylitis by imaging, complete heart block status post pacemaker, paroxysmal A-fib on anticoagulation, hypertension, hyperlipidemia, peripheral neuropathy, stage IV diffuse large B-cell lymphoma status post R-CHOP with last imaging no residual disease, who presented to the emergency department after a fall yesterday.  Reports he stepped in a pothole foot was wet stepped up into his truck with his hands on the steering wheel and slipped off the step and fell backwards onto the ground.  At the time he had what he describes as muscle spasms on both flanks but no pain in his mid back.  He was able to get up and walk after the fall.  Today he sat up and suddenly heard a pop and had severe midline lower back pain which did not radiate.  He has chronic neuropathy in both legs but says this sensation is worse on the left and has hypoesthesia up to the knee on the left, hypoesthesia to the ankle on the right.  Other than his chronic neuropathy symptoms he has no new numbness.  No weakness or incontinence.    Review of Systems:  ROS complete and negative except as marked above   Allergies  Allergen Reactions   Adhesive [Tape] Other (See Comments)    Contact dermatitis   Sulfa Antibiotics Other (See Comments)    Unknown childhood reaction    Prior to Admission medications   Medication Sig Start Date End Date Taking? Authorizing Provider  acetaminophen (TYLENOL) 325 MG tablet Take 2 tablets (650 mg total) by mouth every 6 (six) hours as needed for headache. 01/13/21  Yes Emokpae, Courage, MD  albuterol (VENTOLIN HFA) 108 (90 Base) MCG/ACT  inhaler Inhale 2 puffs into the lungs every 6 (six) hours as needed for wheezing or shortness of breath. 05/15/23  Yes [provider]  alfuzosin (UROXATRAL) 10 MG 24 hr tablet TAKE ONE TABLET DAILY WITH BREAKFAST 05/09/23  Yes Bjorn Pippin, MD  ELIQUIS 5 MG TABS tablet Take 1 tablet (5 mg total) by mouth 2 (two) times daily. Resume from may 9 09/13/22  Yes Kc, Ramesh, MD  FEROSUL 325 (65 Fe) MG tablet Take 1 tablet by mouth once daily with breakfast Patient taking differently: Take 325 mg by mouth every other day. 07/30/21  Yes Pennington, Rebekah M, PA-C  fludrocortisone (FLORINEF) 0.1 MG tablet TAKE ONE TABLET ONCE DAILY WITH BREAKFAST 12/11/22  Yes Nida, Denman George, MD  furosemide (LASIX) 40 MG tablet Take 1 tablet (40 mg total) by mouth daily. 12/25/22  Yes Camnitz, Will Daphine Deutscher, MD  levothyroxine (SYNTHROID) 100 MCG tablet TAKE ONE TABLET ONCE DAILY BEFORE BREAKFAST 12/11/22  Yes Nida, Denman George, MD  omeprazole (PRILOSEC) 10 MG capsule Take 20 mg by mouth daily. 10/26/21  Yes [provider]  potassium chloride (KLOR-CON) 10 MEQ tablet TAKE TWO TABLETS TWICE DAILY 12/11/22  Yes Nida, Denman George, MD  predniSONE (DELTASONE) 10 MG tablet TAKE ONE TABLET ONCE DAILY WITH BREAKFAST 12/11/22  Yes Nida, Denman George, MD  pregabalin (LYRICA) 200 MG capsule Take 1 capsule by mouth 2 (two) times daily. 05/22/23  Yes [provider]  rosuvastatin (CRESTOR) 5  MG tablet Take 1 tablet by mouth at bedtime. 08/23/20  Yes [provider]  valACYclovir (VALTREX) 1000 MG tablet Take 1,000 mg by mouth daily.   Yes [provider]  vitamin B-12 (CYANOCOBALAMIN) 1000 MCG tablet Take 1 tablet (1,000 mcg total) by mouth daily. 02/28/21  Yes Pennington, Rebekah M, PA-C  magnesium oxide (MAG-OX) 400 (240 Mg) MG tablet Take 400 mg by mouth daily.    [provider]    Past Medical History:  Diagnosis Date   Atrial fibrillation (HCC)    Cancer (HCC)    Diffuse Large  B-Cell lymphoma   GERD (gastroesophageal reflux disease)    High cholesterol    Hypertension    Obesity     Past Surgical History:  Procedure Laterality Date   APPENDECTOMY     CATARACT EXTRACTION Bilateral 2022   CHOLECYSTECTOMY     ESOPHAGEAL DILATION     multiple times   IR IMAGING GUIDED PORT INSERTION  03/31/2021   JOINT REPLACEMENT Left    hip   PACEMAKER IMPLANT N/A 09/07/2022   Procedure: PACEMAKER IMPLANT;  Surgeon: Regan Lemming, MD;  Location: [facility];  Service: Cardiovascular;  Laterality: N/A;     reports that he has never smoked. He has never been exposed to tobacco smoke. He has never used smokeless tobacco. He reports that he does not drink alcohol and does not use drugs.  Family History  Problem Relation Age of Onset   Heart failure Father    Heart attack Father        Deceased    Thyroid disease Sister      Physical Exam: Vitals:   05/25/23 0030 05/25/23 0045 05/25/23 0130 05/25/23 0204  BP: (!) 155/75 (!) 151/90 (!) 162/89 121/82  Pulse: (!) 59 (!) 57 60 61  Resp: (!) 21 (!) 28 (!) 24 (!) 25  Temp:      TempSrc:      SpO2: 96% 95% 94% 93%  Weight:      Height:        Gen: Awake, alert, NAD   CV: Regular, normal S1, S2, no murmurs  Resp: Normal WOB, clear anteriorly Abd: Obese, normoactive, nontender MSK: Maintain flat for exam.  Symmetric, trace edema Skin: No rashes or lesions to exposed skin  Neuro: Alert and interactive.  Motor appears 5 out of 5 and symmetric throughout the lower extremities.  Sensation is intact at least L1-L4 bilaterally.  Appears to have stocking-like distribution of sensory loss below the ankle and both feet Psych: euthymic, appropriate    Data review:   Labs reviewed, notable for:   Chemistries and blood counts unremarkable  Micro:  Results for orders placed or performed during the hospital encounter of 05/24/23  Resp panel by RT-PCR (RSV, Flu A&B, Covid) Anterior Nasal Swab     Status: None    Collection Time: 05/24/23 11:48 PM   Specimen: Anterior Nasal Swab  Result Value Ref Range Status   SARS Coronavirus 2 by RT PCR NEGATIVE NEGATIVE Final    Comment: (NOTE) SARS-CoV-2 target nucleic acids are NOT DETECTED.  The SARS-CoV-2 RNA is generally detectable in upper respiratory specimens during the acute phase of infection. The lowest concentration of SARS-CoV-2 viral copies this assay can detect is 138 copies/mL. A negative result does not preclude SARS-Cov-2 infection and should not be used as the sole basis for treatment or other patient management decisions. A negative result may occur with  improper specimen collection/handling, submission  of specimen other than nasopharyngeal swab, presence of viral mutation(s) within the areas targeted by this assay, and inadequate number of viral copies(<138 copies/mL). A negative result must be combined with clinical observations, patient history, and epidemiological information. The expected result is Negative.  Fact Sheet for Patients:  BloggerCourse.com  Fact Sheet for Healthcare Providers:  SeriousBroker.it  This test is no t yet approved or cleared by the [place] FDA and  has been authorized for detection and/or diagnosis of SARS-CoV-2 by FDA under an Emergency Use Authorization (EUA). This EUA will remain  in effect (meaning this test can be used) for the duration of the COVID-19 declaration under Section 564(b)(1) of the Act, 21 U.S.C.section 360bbb-3(b)(1), unless the authorization is terminated  or revoked sooner.       Influenza A by PCR NEGATIVE NEGATIVE Final   Influenza B by PCR NEGATIVE NEGATIVE Final    Comment: (NOTE) The Xpert Xpress SARS-CoV-2/FLU/RSV plus assay is intended as an aid in the diagnosis of influenza from Nasopharyngeal swab specimens and should not be used as a sole basis for treatment. Nasal washings and aspirates are unacceptable for  Xpert Xpress SARS-CoV-2/FLU/RSV testing.  Fact Sheet for Patients: BloggerCourse.com  Fact Sheet for Healthcare Providers: SeriousBroker.it  This test is not yet approved or cleared by the [place] FDA and has been authorized for detection and/or diagnosis of SARS-CoV-2 by FDA under an Emergency Use Authorization (EUA). This EUA will remain in effect (meaning this test can be used) for the duration of the COVID-19 declaration under Section 564(b)(1) of the Act, 21 U.S.C. section 360bbb-3(b)(1), unless the authorization is terminated or revoked.     Resp Syncytial Virus by PCR NEGATIVE NEGATIVE Final    Comment: (NOTE) Fact Sheet for Patients: BloggerCourse.com  Fact Sheet for Healthcare Providers: SeriousBroker.it  This test is not yet approved or cleared by the [place] FDA and has been authorized for detection and/or diagnosis of SARS-CoV-2 by FDA under an Emergency Use Authorization (EUA). This EUA will remain in effect (meaning this test can be used) for the duration of the COVID-19 declaration under Section 564(b)(1) of the Act, 21 U.S.C. section 360bbb-3(b)(1), unless the authorization is terminated or revoked.  Performed at [facility] [address] [place], [place] [address]     Imaging reviewed:  DG CHEST PORT 1 VIEW Result Date: 05/25/2023 CLINICAL DATA:  Hypoxia.  Fall yesterday. EXAM: PORTABLE CHEST 1 VIEW COMPARISON:  03/20/2023. FINDINGS: The heart is enlarged and the mediastinal contour stable. There is atherosclerotic calcification of the aorta. Lung volumes are low with mild atelectasis at the left lung base. No effusion or pneumothorax is seen. A pacemaker device is present over the left chest. A stable right chest port is noted. No acute fracture is seen. IMPRESSION: 1. Low lung volumes with mild atelectasis at the left lung base. 2. Cardiomegaly.  Electronically Signed   By: Thornell Sartorius M.D.   On: 05/25/2023 00:09   CT Thoracic Spine Wo Contrast Result Date: 05/24/2023 CLINICAL DATA:  Larey Seat yesterday.  Acute back pain.  Unable to walk. EXAM: CT THORACIC SPINE WITHOUT CONTRAST TECHNIQUE: Multidetector CT images of the thoracic were obtained using the standard protocol without intravenous contrast. RADIATION DOSE REDUCTION: This exam was performed according to the departmental dose-optimization program which includes automated exposure control, adjustment of the mA and/or kV according to patient size and/or use of iterative reconstruction technique. COMPARISON:  Lumbar exam same day. FINDINGS: Alignment: Abnormal alignment at T11-12 as described above.  Vertebrae: Chronic ankylosis of the spine consistent with ankylosing spondylitis. Acute Chance fracture affecting T11 and the T11-12 disc level with disruption of anterior and posterior tension bands, distraction and 2 mm of anterolisthesis of T11 relative to T12. I do not see definite bony compromise or hematoma compromise of the spinal canal, but consider MRI for better spinal canal evaluation. A0 spine fracture classification B1 with some B3 features. I also think there is a component of fracture of the posterior elements at the T10-11 level but do not see an anterior fracture or canal compromise at that level. Paraspinal and other soft tissues: Bilateral pleural effusions with dependent atelectasis. Disc levels: No other compressive narrowing of the canal or foramina because of the widespread ankylosis. IMPRESSION: 1. Acute distracted Chance fracture affecting T11 and the T11-12 disc level with disruption of anterior and posterior tension bands, distraction and 2 mm of anterolisthesis of T11 relative to T12. I do not see definite bony compromise or hematoma compromise of the spinal canal, but consider MRI for better spinal canal evaluation. A0 spine fracture classification B1 with some B3 features. 2.  There does appear to be posterior element fractures at the T10-11 level affecting the ankylosed posterior interspinous ligament but I do not see fracture of the laminae or anterior structures. 3. Chronic ankylosis of the spine consistent with ankylosing spondylitis. 4. Bilateral pleural effusions with dependent atelectasis. 5. Critical Value/emergent results were called by telephone at the time of interpretation on 05/24/2023 at 6:39 pm to provider [facility] , who verbally acknowledged these results. Electronically Signed   By: Paulina Fusi M.D.   On: 05/24/2023 18:39   CT Lumbar Spine Wo Contrast Result Date: 05/24/2023 CLINICAL DATA:  Larey Seat yesterday with acute back pain. Unable to stand or walk. EXAM: CT LUMBAR SPINE WITHOUT CONTRAST TECHNIQUE: Multidetector CT imaging of the lumbar spine was performed without intravenous contrast administration. Multiplanar CT image reconstructions were also generated. RADIATION DOSE REDUCTION: This exam was performed according to the departmental dose-optimization program which includes automated exposure control, adjustment of the mA and/or kV according to patient size and/or use of iterative reconstruction technique. COMPARISON:  CT abdomen 03/20/2023 FINDINGS: Segmentation: 5 lumbar type vertebral bodies. Alignment: Normal Vertebrae: Chronic ankylosis of the sacroiliac joints and spine, probably secondary to ankylosing spondylitis. No fracture in the lumbar region. See results of thoracic exam. No evidence of sacral fracture. Paraspinal and other soft tissues: Aortic atherosclerosis. No aneurysm. Disc levels: No significant disc level pathology. No compressive stenosis of the canal or foramina. IMPRESSION: 1. No acute or traumatic finding. Chronic ankylosis of the sacroiliac joints and spine, probably secondary to ankylosing spondylitis. 2. Aortic atherosclerosis. Aortic Atherosclerosis (ICD10-I70.0). Electronically Signed   By: Paulina Fusi M.D.   On: 05/24/2023 18:32    DG Thoracic Spine 2 View Result Date: 05/24/2023 CLINICAL DATA:  Fall. EXAM: THORACIC SPINE 2 VIEWS COMPARISON:  Chest radiographs 09/08/2022. CT abdomen and pelvis 03/20/2023. FINDINGS: There is diffuse thoracic spine ankylosis. There is new slight anterolisthesis of a lower thoracic vertebral body with a suspected acute fracture extending through the intervening disc space and posterior elements with mild distraction. A Port-A-Cath, pacemaker, and cholecystectomy clips are noted. IMPRESSION: Ankylosing spondylitis with acute chalkstick fracture near the thoracolumbar junction. CT of the thoracic and lumbar spine is recommended for further evaluation. Electronically Signed   By: Sebastian Ache M.D.   On: 05/24/2023 16:40     ED Course:  Treated with Percocet.  Neurosurgery was consulted with the  results of imaging demonstrating a Chance fracture at T11-12, which is unstable.  They recommend strict spinal precautions, remain flat, logroll for turns.  Recommend for MRI after he arrives to [facility] and they will consult.  Will need surgical fixation after Eliquis washout,  Recommend no anticoagulation in interim.   Assessment/Plan:  73 y.o. male with hx hx of primary adrenal insufficiency on chronic glucocorticoid/mineralocorticoid, ankylosing spondylitis by imaging, complete heart block status post pacemaker, paroxysmal A-fib on anticoagulation, hypertension, hyperlipidemia, peripheral neuropathy, stage IV diffuse large B-cell lymphoma status post R-CHOP with last imaging no residual disease, who presented to the emergency department after a fall yesterday, then today sudden pop and midline lower back pain.  Found to have a Chance fracture at T11-12, unstable.  Unstable T11-12 Chance fracture, 2 mm anterolisthesis Additional fracture posterior elements of T10-11 Ground-level fall, mechanical Ground-level fall on 1/16 interestingly mostly asymptomatic but on 1/17 with transition to standing had a pop and  sudden midline lower back pain.  Question if ankylosing spondylitis which was incidentally noted on imaging may have predisposed to fracture.  Is also on chronic glucocorticoids for his adrenal insufficiency.  Exam with features of chronic neuropathic changes stocking distribution loss below the ankles, no other neurologic deficits. Imaging with CT demonstrating chance fracture affecting T11 and T11-12 disc level with disruption of anterior and posterior tension bands, distraction and 2 mm of anterolisthesis of T11 relative to T12. A0 spine fracture classification B1 with some B3 features; posterior element fractures at the T10-11 level affecting the ankylosed posterior interspinous ligament -EDP consulted with neurosurgery, recommend strict spinal precautions, remain flat, logroll for turns.  Recommend for MRI after he arrives to [facility] and they will consult.  Will need surgical fixation after Eliquis washout,  Recommend no anticoagulation in interim. -Hold on MRI until at [facility] per neurosurgery -Strict spinal precautions, HOB flat, bed rest, log roll for turns.   -Hold Eliquis (last taken 1/17 in a.m.)  -Condom cath for urinary management, if not effective would place Foley -Symptomatic management Tylenol as needed for mild, oxycodone 2.5/5 mg for moderate/severe, Dilaudid 0.5 mg IV for breakthrough -Check vitamin D, start on calcium and vitamin D supplementation due to his chronic glucocorticoid use. -Note he will need stress dosing of hydrocortisone perioperatively given his adrenal insufficiency  Acute hypoxic respiratory failure Requiring 3 L O2 in the ED with borderline O2 sat.  CT noting pleural effusion and bibasilar atelectasis, chest x-ray with note of low volumes and left basilar atelectasis.  Suspect likely due to hypoventilation secondary to pain from his vertebral fracture.  May have component of chronic restrictive lung disease related to his ankylosing spondylitis - Check  flu/COVID/RSV - Pain control per above - Incentive spirometry, flutter valve, albuterol as needed  Incidental findings: Ankylosing spondylitis by imaging: He will need to have a referral to rheumatology to evaluate for therapy for ankylosing spondylitis.  Likely would benefit from PFTs to evaluate for restrictive lung disease as well.  Chronic medical problems: Primary adrenal insufficiency: Continue home prednisone 10 mg, fludrocortisone 0.1 mg daily.  If he develops any hypotension may need stress dosing.  See above will need stress dosing for perioperative.. CHB status post PPM Paroxysmal A-fib: Hold home Eliquis.  Not on rate control agents. Hypertension: Not on antihypertensives, continue home Lasix 40 mg Hyperlipidemia: Continue home rosuvastatin Peripheral neuropathy: Continue home Lyrica BPH: Continue home alfuzosin Diffuse large B-cell lymphoma status post R-CHOP, no residual disease: Outpatient surveillance.   Body mass index  is 40.42 kg/m.  Morbid obesity would benefit from weight loss.  DVT prophylaxis:  SCDs Code Status:  Full Code Diet:  Diet Orders (From admission, onward)     Start     Ordered   05/24/23 2047  Diet regular Room service appropriate? Yes; Fluid consistency: Thin  Diet effective now       Question Answer Comment  Room service appropriate? Yes   Fluid consistency: Thin      05/24/23 2048           Family Communication: No Consults: Neurosurgery Admission status:   Inpatient, Telemetry bed -> [facility]   Severity of Illness: The appropriate patient status for this patient is INPATIENT. Inpatient status is judged to be reasonable and necessary in order to provide the required intensity of service to ensure the patient's safety. The patient's presenting symptoms, physical exam findings, and initial radiographic and laboratory data in the context of their chronic comorbidities is felt to place them at high risk for further clinical deterioration.  Furthermore, it is not anticipated that the patient will be medically stable for discharge from the hospital within 2 midnights of admission.   * I certify that at the point of admission it is my clinical judgment that the patient will require inpatient hospital care spanning beyond 2 midnights from the point of admission due to high intensity of service, high risk for further deterioration and high frequency of surveillance required.*   Dolly Rias, MD [facility]  How to contact the [facility] Attending or Consulting provider 7A - 7P or covering provider during after hours 7P -7A, for this patient.  Check the care team in [facility] and look for a) attending/consulting [facility] provider listed and b) the [facility] team listed Log into [email or web address] and use 's universal password to access. If you do not have the password, please contact the hospital operator. Locate the [facility] provider you are looking for under [facility] and page to a number that you can be directly reached. If you still have difficulty reaching the provider, please page the [facility] (Director on Call) for the Hospitalists listed on amion for assistance.  05/25/2023, 3:39 AM

## 2023-05-25 NOTE — Progress Notes (Signed)
[facility] PROGRESS NOTE  Roy [name] (DOB: 1950-06-14) UJW:[id number] PCP: Rebecka Apley, NP  Brief Narrative: Roy [name] is a 73 y.o. male with hx of primary adrenal insufficiency on chronic glucocorticoid/mineralocorticoid, ankylosing spondylitis, CHB s/p PPM May 2024, PAF on eliquis, HTN, HLD, stage IV DLBCL s/p R-CHOP with no residual disease on last imaging who presented to the ED 1/17 with back pain after a fall. Attempting to enter his truck 1/16, he slipped off the step and fell backwards onto the ground.  At the time he had what he describes as muscle spasms on both flanks but no pain in his mid back.  He was able to get up and walk after the fall.  The following day, he sat up and suddenly heard a pop and had severe midline lower back pain which did not radiate.  He has chronic neuropathy in both legs but says this sensation is worse on the left and has hypoesthesia up to the knee on the left, hypoesthesia to the ankle on the right.  Other than his chronic neuropathy symptoms he has no new numbness.  No weakness or incontinence.   Imaging in the ED was concerning for unstable T11 fracture for which neurosurgery recommended transfer to [facility] for surgical management, strict spinal precautions, and allowing eliquis washout.  Subjective: Pain controlled this morning, no new weakness or numbness. He's thirsty, aware of plan.  Objective: BP 125/79   Pulse (!) 59   Temp 98 F (36.7 C) (Oral)   Resp (!) 23   Ht 6' (1.829 m)   Wt 135.2 kg   SpO2 95%   BMI 40.42 kg/m   Gen: Obese male in no distress Pulm: Clear, nonlabored anterolaterally  CV: RRR, no MRG  GI: Soft, NT, ND, +BS Neuro: Alert and oriented, intact motor function throughout. distal sensory impairment in feet and ankles symmetrically, chronically. No new focal deficits. Ext: Warm, dry, trace dependent edema. Skin: No rashes, lesions or ulcers on visualized skin   Assessment & Plan: Unstable T11-12  Chance fracture, 2 mm anterolisthesis, additional fracture posterior elements of T10-11: Ground-level fall on 1/16 interestingly mostly asymptomatic but on 1/17 with transition to standing had a pop and sudden midline lower back pain. Question if ankylosing spondylitis which was incidentally noted on imaging may have predisposed to fracture.  Is also on chronic glucocorticoids for his adrenal insufficiency.  Exam with features of chronic neuropathic changes stocking distribution loss below the ankles, no other neurologic deficits. Imaging with CT demonstrating chance fracture affecting T11 and T11-12 disc level with disruption of anterior and posterior tension bands, distraction and 2 mm of anterolisthesis of T11 relative to T12. A0 spine fracture classification B1 with some B3 features; posterior element fractures at the T10-11 level affecting the ankylosed posterior interspinous ligament. - Per neurosurgery, patient is to maintain strict spinal precautions, remain flat, logroll for turns. Will need surgical fixation after Eliquis washout (last dose 1/17 AM), avoid anticoagulation. - On arrival to [facility], will need thoracic spine MRI and notification to neurosurgery.  - Condom cath for urinary management, if not effective would place Foley - Continue analgesia with tylenol prn mild, oxycodone 2.5mg -5mg  prn mod-severe pain, and IV dilaudid 0.5mg  prn breakthrough pain. - Started on calcium and vitamin D supplementation due to his chronic glucocorticoid use.   Acute hypoxic respiratory failure: CT noting pleural effusion and bibasilar atelectasis, chest x-ray with note of low volumes and left basilar atelectasis.  Suspect likely due to hypoventilation secondary  to pain from his vertebral fracture. Suspect hypoventilation due to pain, immobility. Viral swab negative.  - Incentive spirometry is stressed.  - Continue supplemental oxygen as needed to maintain normal WOB and SpO2 >89%.    Ankylosing spondylitis by  imaging:  - He will need to have a referral to rheumatology to evaluate for therapy for ankylosing spondylitis.  Likely would benefit from PFTs to evaluate for restrictive lung disease as well.   Primary adrenal insufficiency:  - Continue home prednisone 10 mg, fludrocortisone 0.1 mg daily. BP and metabolic profile are unremarkable.  - If he develops any hypotension may need stress dosing sooner, but otherwise, would likely give perioperatively.   Vitamin D deficiency: Level is 20.35.  - Start supplementation w/50k units weekly  Leukocytosis: Afebrile, suspected to be reactive to fractures.   CHB status post PPM, PAF:  - Holding DOAC for neurosurgery. Not on rate control agents, rate ~60bpm.   HTN:  - Continue lasix 40 mg  HLD:  - Continue rosuvastatin  Peripheral neuropathy:  - Continue home lyrica  BPH:  - Continue home alfuzosin  Hypothyroidism: Recent TSH wnl. - Continue synthroid  Diffuse large B-cell lymphoma status post R-CHOP, no residual disease:  - Outpatient surveillance.   Tyrone Nine, MD [facility] [email or web address] 05/25/2023, 3:30 PM

## 2023-05-25 NOTE — Plan of Care (Signed)

## 2023-05-25 NOTE — TOC CM/SW Note (Signed)
Transition of Care [facility]) - Inpatient Brief Assessment   Patient Details  Name: Roy [name] MRN: [id number] Date of Birth: 12-10-1950  Transition of Care [facility]) CM/SW Contact:    Villa Herb, LCSWA Phone Number: 05/25/2023, 10:50 AM   Clinical Narrative: Transition of Care Department [facility]) has reviewed patient and no TOC needs have been identified at this time. We will continue to monitor patient advancement through interdisciplinary progression rounds. If new patient transition needs arise, please place a TOC consult.  Transition of Care Asessment: Insurance and Status: Insurance coverage has been reviewed Patient has primary care physician: Yes Home environment has been reviewed: From home Prior level of function:: Independent Prior/Current Home Services: No current home services Social Drivers of Health Review: SDOH reviewed no interventions necessary Readmission risk has been reviewed: Yes Transition of care needs: no transition of care needs at this time

## 2023-05-25 NOTE — Progress Notes (Signed)
Verified with Dr. Yetta Barre and [facility] NP that patient can have slightly elevated HOB but needs to remain on bedrest.

## 2023-05-26 DIAGNOSIS — S22082A Unstable burst fracture of T11-T12 vertebra, initial encounter for closed fracture: Secondary | ICD-10-CM

## 2023-05-26 LAB — BASIC METABOLIC PANEL
Anion gap: 9 (ref 5–15)
BUN: 13 mg/dL (ref 8–23)
CO2: 31 mmol/L (ref 22–32)
Calcium: 9 mg/dL (ref 8.9–10.3)
Chloride: 92 mmol/L — ABNORMAL LOW (ref 98–111)
Creatinine, Ser: 0.94 mg/dL (ref 0.61–1.24)
GFR, Estimated: >60 mL/min (ref >60)
Glucose, Bld: 95 mg/dL (ref 70–99)
Potassium: 3.6 mmol/L (ref 3.5–5.1)
Sodium: 132 mmol/L — ABNORMAL LOW (ref 135–145)

## 2023-05-26 LAB — CBC
HCT: 43.9 % (ref 39.0–52.0)
Hemoglobin: 14.5 g/dL (ref 13.0–17.0)
MCH: 31.1 pg (ref 26.0–34.0)
MCHC: 33 g/dL (ref 30.0–36.0)
MCV: 94.2 fL (ref 80.0–100.0)
Platelets: 168 10*3/uL (ref 150–400)
RBC: 4.66 MIL/uL (ref 4.22–5.81)
RDW: 14.5 % (ref 11.5–15.5)
WBC: 11.5 10*3/uL — ABNORMAL HIGH (ref 4.0–10.5)
nRBC: 0 % (ref 0.0–0.2)

## 2023-05-26 LAB — MRSA NEXT GEN BY PCR, NASAL: MRSA by PCR Next Gen: NOT DETECTED

## 2023-05-26 MED ORDER — GUAIFENESIN 100 MG/5ML PO LIQD
10.0000 mL | Freq: Four times a day (QID) | ORAL | Status: DC | PRN
  Administered 2023-05-26 – 2023-06-02 (×2): 10 mL via ORAL
  Filled 2023-05-26 (×2): qty 10

## 2023-05-26 MED ORDER — ALUM & MAG HYDROXIDE-SIMETH 200-200-20 MG/5ML PO SUSP
30.0000 mL | Freq: Four times a day (QID) | ORAL | Status: DC | PRN
  Administered 2023-05-26 – 2023-06-01 (×6): 30 mL via ORAL
  Filled 2023-05-26 (×6): qty 30

## 2023-05-26 NOTE — Progress Notes (Signed)
Patient lying in bed comfortably.  No complaints of numbness or tingling or weakness in the legs.  Moves his legs okay to a in bed exam.  Tentative plan for open reduction internal fixation tomorrow with Dr. Danielle Dess.

## 2023-05-26 NOTE — Progress Notes (Signed)
[facility] PROGRESS NOTE  Roy [name] (DOB: 1950/05/27) ZOX:[id number] PCP: Rebecka Apley, NP  Brief Narrative: Roy [name] is a 73 y.o. male with hx of primary adrenal insufficiency on chronic glucocorticoid/mineralocorticoid, ankylosing spondylitis, CHB s/p PPM May 2024, PAF on eliquis, HTN, HLD, stage IV DLBCL s/p R-CHOP with no residual disease on last imaging who presented to the ED 1/17 with back pain after a fall. Attempting to enter his truck 1/16, he slipped off the step and fell backwards onto the ground.  At the time he had what he describes as muscle spasms on both flanks but no pain in his mid back.  He was able to get up and walk after the fall.  The following day, he sat up and suddenly heard a pop and had severe midline lower back pain which did not radiate.  He has chronic neuropathy in both legs but says this sensation is worse on the left and has hypoesthesia up to the knee on the left, hypoesthesia to the ankle on the right.  Other than his chronic neuropathy symptoms he has no new numbness.  No weakness or incontinence.   Imaging in the ED was concerning for unstable T11 fracture for which neurosurgery recommended transfer to [facility] for surgical management, strict spinal precautions, and allowing eliquis washout.  Subjective:  Patient seen in the morning rounds.  Patient tells me that he did talk to surgeon and they will do some surgery tomorrow.  Denies any pain.  Just tired of laying in the bed.  Objective: BP 115/73 (BP Location: Left Arm)   Pulse (!) 58   Temp 98 F (36.7 C) (Oral)   Resp 15   Ht 6' (1.829 m)   Wt 135.2 kg   SpO2 96%   BMI 40.42 kg/m   Gen: Chronically sick looking gentleman.  Not in any distress. Pulm: Clear, nonlabored anterolaterally  CV: RRR, no MRG  GI: Soft, NT, ND, +BS Neuro: Alert and oriented, intact motor function throughout. distal sensory impairment in feet and ankles symmetrically, chronically. No new focal  deficits. Ext: Warm, dry, trace dependent edema. Skin: No rashes, lesions or ulcers on visualized skin   Assessment & Plan:  Unstable T11-12 Chance fracture, 2 mm anterolisthesis, additional fracture posterior elements of T10-11:  -Followed by neurosurgery, patient is to maintain strict spinal precautions, remain flat, logroll for turns. Will need surgical fixation after Eliquis washout (last dose 1/17 AM), avoid anticoagulation.  Tentative plan for tomorrow. -MRI of the thoracic spine. - Condom cath for urinary management, if not effective would place Foley - Continue analgesia with tylenol prn mild, oxycodone 2.5mg -5mg  prn mod-severe pain, and IV dilaudid 0.5mg  prn breakthrough pain. - Started on calcium and vitamin D supplementation due to his chronic glucocorticoid use.   Acute hypoxic respiratory failure: CT noting pleural effusion and bibasilar atelectasis, chest x-ray with note of low volumes and left basilar atelectasis.  Suspect likely due to hypoventilation secondary to pain from his vertebral fracture. Suspect hypoventilation due to pain, immobility. Viral swab negative.  - Incentive spirometry is stressed.  - Continue supplemental oxygen as needed to maintain normal WOB and SpO2 >89%.    Ankylosing spondylitis by imaging:  - He will need to have a referral to rheumatology to evaluate for therapy for ankylosing spondylitis.  Likely would benefit from PFTs to evaluate for restrictive lung disease as well.   Primary adrenal insufficiency:  - Continue home prednisone 10 mg, fludrocortisone 0.1 mg daily. BP and metabolic  profile are unremarkable.  - If he develops any hypotension may need stress dosing sooner, but otherwise, would likely give perioperatively.   Vitamin D deficiency: Level is 20.35.  - Start supplementation w/50k units weekly  Leukocytosis: Afebrile, suspected to be reactive to fractures.   CHB status post PPM, PAF:  - Holding DOAC for neurosurgery. Not on rate  control agents, rate ~60bpm.   HTN:  - Continue lasix 40 mg  HLD:  - Continue rosuvastatin  Peripheral neuropathy:  - Continue home lyrica  BPH:  - Continue home alfuzosin  Hypothyroidism: Recent TSH wnl. - Continue synthroid  Diffuse large B-cell lymphoma status post R-CHOP, no residual disease:  - Outpatient surveillance.   Dorcas Carrow, MD [facility] [email or web address] 05/26/2023, 11:02 AM

## 2023-05-27 ENCOUNTER — Inpatient Hospital Stay (HOSPITAL_COMMUNITY): Payer: 59 | Admitting: Anesthesiology

## 2023-05-27 ENCOUNTER — Other Ambulatory Visit: Payer: Self-pay

## 2023-05-27 ENCOUNTER — Encounter (HOSPITAL_COMMUNITY): Payer: Self-pay | Admitting: Internal Medicine

## 2023-05-27 ENCOUNTER — Inpatient Hospital Stay (HOSPITAL_COMMUNITY): Payer: 59

## 2023-05-27 ENCOUNTER — Encounter (HOSPITAL_COMMUNITY)
Admission: EM | Disposition: A | Discharge status: Skilled Nursing Facility | Payer: Self-pay | Source: Home / Self Care | Attending: Internal Medicine

## 2023-05-27 DIAGNOSIS — S22089A Unspecified fracture of T11-T12 vertebra, initial encounter for closed fracture: Secondary | ICD-10-CM

## 2023-05-27 DIAGNOSIS — S22082A Unstable burst fracture of T11-T12 vertebra, initial encounter for closed fracture: Secondary | ICD-10-CM | POA: Diagnosis not present

## 2023-05-27 DIAGNOSIS — E039 Hypothyroidism, unspecified: Secondary | ICD-10-CM

## 2023-05-27 DIAGNOSIS — I509 Heart failure, unspecified: Secondary | ICD-10-CM

## 2023-05-27 DIAGNOSIS — I110 Hypertensive heart disease with heart failure: Secondary | ICD-10-CM

## 2023-05-27 HISTORY — PX: LUMBAR PERCUTANEOUS PEDICLE SCREW 3 LEVEL: SHX5562

## 2023-05-27 LAB — TYPE AND SCREEN
ABO/RH(D): A POS
Antibody Screen: NEGATIVE

## 2023-05-27 LAB — ABO/RH: ABO/RH(D): A POS

## 2023-05-27 SURGERY — APPLICATION OF O-ARM
Anesthesia: General

## 2023-05-27 MED ORDER — HYDROMORPHONE HCL 1 MG/ML IJ SOLN
0.5000 mg | INTRAMUSCULAR | Status: DC | PRN
Start: 2023-05-27 — End: 2023-06-03
  Administered 2023-05-27 – 2023-06-03 (×3): 0.5 mg via INTRAVENOUS
  Filled 2023-05-27 (×4): qty 0.5

## 2023-05-27 MED ORDER — PHENYLEPHRINE 80 MCG/ML (10ML) SYRINGE FOR IV PUSH (FOR BLOOD PRESSURE SUPPORT)
PREFILLED_SYRINGE | INTRAVENOUS | Status: AC
  Filled 2023-05-27: qty 10

## 2023-05-27 MED ORDER — DOCUSATE SODIUM 100 MG PO CAPS
100.0000 mg | ORAL_CAPSULE | Freq: Two times a day (BID) | ORAL | Status: DC
  Administered 2023-05-27 – 2023-06-05 (×17): 100 mg via ORAL
  Filled 2023-05-27 (×17): qty 1

## 2023-05-27 MED ORDER — METHOCARBAMOL 500 MG PO TABS
500.0000 mg | ORAL_TABLET | Freq: Four times a day (QID) | ORAL | Status: DC | PRN
  Administered 2023-05-27 – 2023-06-04 (×16): 500 mg via ORAL
  Filled 2023-05-27 (×16): qty 1

## 2023-05-27 MED ORDER — SENNA 8.6 MG PO TABS
1.0000 | ORAL_TABLET | Freq: Two times a day (BID) | ORAL | Status: DC
  Administered 2023-05-27 – 2023-06-05 (×17): 8.6 mg via ORAL
  Filled 2023-05-27 (×17): qty 1

## 2023-05-27 MED ORDER — METHOCARBAMOL 1000 MG/10ML IJ SOLN
500.0000 mg | Freq: Once | INTRAMUSCULAR | Status: AC | PRN
  Administered 2023-05-27: 500 mg via INTRAVENOUS
  Filled 2023-05-27: qty 10

## 2023-05-27 MED ORDER — SODIUM CHLORIDE 0.9% FLUSH: 3.0000 mL | INTRAVENOUS | Status: DC | PRN

## 2023-05-27 MED ORDER — FENTANYL CITRATE (PF) 250 MCG/5ML IJ SOLN
INTRAMUSCULAR | Status: AC
  Filled 2023-05-27: qty 5

## 2023-05-27 MED ORDER — LIDOCAINE 2% (20 MG/ML) 5 ML SYRINGE
INTRAMUSCULAR | Status: AC
  Filled 2023-05-27: qty 5

## 2023-05-27 MED ORDER — DEXAMETHASONE SODIUM PHOSPHATE 10 MG/ML IJ SOLN
INTRAMUSCULAR | Status: DC | PRN
  Administered 2023-05-27: 10 mg via INTRAVENOUS

## 2023-05-27 MED ORDER — ACETAMINOPHEN 325 MG PO TABS
650.0000 mg | ORAL_TABLET | ORAL | Status: DC | PRN
  Administered 2023-06-04: 650 mg via ORAL
  Filled 2023-05-27: qty 2

## 2023-05-27 MED ORDER — DEXTROSE 5 % IV SOLN
INTRAVENOUS | Status: DC | PRN
  Administered 2023-05-27: 3 g via INTRAVENOUS

## 2023-05-27 MED ORDER — PROPOFOL 10 MG/ML IV BOLUS
INTRAVENOUS | Status: DC | PRN
  Administered 2023-05-27: 100 mg via INTRAVENOUS

## 2023-05-27 MED ORDER — BUPIVACAINE HCL (PF) 0.5 % IJ SOLN
INTRAMUSCULAR | Status: AC
  Filled 2023-05-27: qty 30

## 2023-05-27 MED ORDER — LACTATED RINGERS IV SOLN: INTRAVENOUS | Status: AC

## 2023-05-27 MED ORDER — FENTANYL CITRATE (PF) 250 MCG/5ML IJ SOLN
INTRAMUSCULAR | Status: DC | PRN
  Administered 2023-05-27: 100 ug via INTRAVENOUS
  Administered 2023-05-27: 50 ug via INTRAVENOUS

## 2023-05-27 MED ORDER — SODIUM CHLORIDE 0.9 % IV SOLN: 250.0000 mL | INTRAVENOUS | Status: AC

## 2023-05-27 MED ORDER — OXYCODONE-ACETAMINOPHEN 5-325 MG PO TABS
1.0000 | ORAL_TABLET | Freq: Four times a day (QID) | ORAL | Status: DC | PRN
  Administered 2023-05-27 – 2023-05-28 (×3): 2 via ORAL
  Administered 2023-05-29: 1 via ORAL
  Administered 2023-05-29: 2 via ORAL
  Administered 2023-05-30 (×2): 1 via ORAL
  Administered 2023-05-30: 2 via ORAL
  Administered 2023-05-30 – 2023-05-31 (×2): 1 via ORAL
  Administered 2023-05-31: 2 via ORAL
  Administered 2023-06-01 – 2023-06-02 (×3): 1 via ORAL
  Administered 2023-06-02 – 2023-06-04 (×4): 2 via ORAL
  Administered 2023-06-04 – 2023-06-05 (×3): 1 via ORAL
  Administered 2023-06-05: 2 via ORAL
  Administered 2023-06-05 – 2023-06-06 (×2): 1 via ORAL
  Administered 2023-06-06: 2 via ORAL
  Administered 2023-06-06: 1 via ORAL
  Filled 2023-05-27: qty 2
  Filled 2023-05-27 (×3): qty 1
  Filled 2023-05-27 (×2): qty 2
  Filled 2023-05-27 (×3): qty 1
  Filled 2023-05-27 (×3): qty 2
  Filled 2023-05-27 (×2): qty 1
  Filled 2023-05-27 (×3): qty 2
  Filled 2023-05-27 (×2): qty 1
  Filled 2023-05-27: qty 2
  Filled 2023-05-27: qty 1
  Filled 2023-05-27: qty 2
  Filled 2023-05-27 (×2): qty 1
  Filled 2023-05-27: qty 2
  Filled 2023-05-27 (×2): qty 1

## 2023-05-27 MED ORDER — ORAL CARE MOUTH RINSE: 15.0000 mL | Freq: Once | OROMUCOSAL | Status: AC

## 2023-05-27 MED ORDER — SODIUM CHLORIDE 0.9% FLUSH
3.0000 mL | Freq: Two times a day (BID) | INTRAVENOUS | Status: DC
  Administered 2023-05-28 – 2023-06-06 (×18): 3 mL via INTRAVENOUS

## 2023-05-27 MED ORDER — EPHEDRINE 5 MG/ML INJ
INTRAVENOUS | Status: AC
  Filled 2023-05-27: qty 5

## 2023-05-27 MED ORDER — PROPOFOL 10 MG/ML IV BOLUS
INTRAVENOUS | Status: AC
  Filled 2023-05-27: qty 20

## 2023-05-27 MED ORDER — LIDOCAINE-EPINEPHRINE 1 %-1:100000 IJ SOLN
INTRAMUSCULAR | Status: AC
  Filled 2023-05-27: qty 1

## 2023-05-27 MED ORDER — ACETAMINOPHEN 650 MG RE SUPP: 650.0000 mg | RECTAL | Status: DC | PRN

## 2023-05-27 MED ORDER — ROCURONIUM BROMIDE 10 MG/ML (PF) SYRINGE
PREFILLED_SYRINGE | INTRAVENOUS | Status: AC
  Filled 2023-05-27: qty 10

## 2023-05-27 MED ORDER — ONDANSETRON HCL 4 MG/2ML IJ SOLN
INTRAMUSCULAR | Status: DC | PRN
  Administered 2023-05-27: 4 mg via INTRAVENOUS

## 2023-05-27 MED ORDER — PHENOL 1.4 % MT LIQD: 1.0000 | OROMUCOSAL | Status: DC | PRN

## 2023-05-27 MED ORDER — ONDANSETRON HCL 4 MG/2ML IJ SOLN
INTRAMUSCULAR | Status: AC
  Filled 2023-05-27: qty 2

## 2023-05-27 MED ORDER — CEFAZOLIN SODIUM-DEXTROSE 2-4 GM/100ML-% IV SOLN
2.0000 g | Freq: Three times a day (TID) | INTRAVENOUS | Status: AC
  Administered 2023-05-27 – 2023-05-28 (×2): 2 g via INTRAVENOUS
  Filled 2023-05-27 (×2): qty 100

## 2023-05-27 MED ORDER — CEFAZOLIN SODIUM 1 G IJ SOLR
INTRAMUSCULAR | Status: AC
  Filled 2023-05-27: qty 40

## 2023-05-27 MED ORDER — MENTHOL 3 MG MT LOZG: 1.0000 | LOZENGE | OROMUCOSAL | Status: DC | PRN

## 2023-05-27 MED ORDER — LACTATED RINGERS IV SOLN: INTRAVENOUS | Status: DC | PRN

## 2023-05-27 MED ORDER — CHLORHEXIDINE GLUCONATE 0.12 % MT SOLN
OROMUCOSAL | Status: AC
  Administered 2023-05-27: 15 mL via OROMUCOSAL
  Filled 2023-05-27: qty 15

## 2023-05-27 MED ORDER — ROCURONIUM BROMIDE 10 MG/ML (PF) SYRINGE
PREFILLED_SYRINGE | INTRAVENOUS | Status: DC | PRN
  Administered 2023-05-27: 40 mg via INTRAVENOUS
  Administered 2023-05-27: 60 mg via INTRAVENOUS
  Administered 2023-05-27: 20 mg via INTRAVENOUS

## 2023-05-27 MED ORDER — THROMBIN 5000 UNITS EX SOLR
CUTANEOUS | Status: AC
  Filled 2023-05-27: qty 5000

## 2023-05-27 MED ORDER — METHOCARBAMOL 500 MG PO TABS: 500.0000 mg | ORAL_TABLET | Freq: Once | ORAL | Status: DC | PRN

## 2023-05-27 MED ORDER — BISACODYL 10 MG RE SUPP
10.0000 mg | Freq: Every day | RECTAL | Status: DC | PRN
  Administered 2023-05-29: 10 mg via RECTAL
  Filled 2023-05-27: qty 1

## 2023-05-27 MED ORDER — PHENYLEPHRINE 80 MCG/ML (10ML) SYRINGE FOR IV PUSH (FOR BLOOD PRESSURE SUPPORT)
PREFILLED_SYRINGE | INTRAVENOUS | Status: DC | PRN
  Administered 2023-05-27 (×5): 80 ug via INTRAVENOUS
  Administered 2023-05-27: 120 ug via INTRAVENOUS
  Administered 2023-05-27 (×8): 80 ug via INTRAVENOUS

## 2023-05-27 MED ORDER — FENTANYL CITRATE (PF) 100 MCG/2ML IJ SOLN: 25.0000 ug | INTRAMUSCULAR | Status: DC | PRN

## 2023-05-27 MED ORDER — LIDOCAINE 2% (20 MG/ML) 5 ML SYRINGE
INTRAMUSCULAR | Status: DC | PRN
  Administered 2023-05-27: 60 mg via INTRAVENOUS

## 2023-05-27 MED ORDER — METHOCARBAMOL 1000 MG/10ML IJ SOLN
500.0000 mg | Freq: Four times a day (QID) | INTRAMUSCULAR | Status: DC | PRN
  Administered 2023-06-04: 500 mg via INTRAVENOUS
  Filled 2023-05-27: qty 10

## 2023-05-27 MED ORDER — BUPIVACAINE HCL (PF) 0.5 % IJ SOLN
INTRAMUSCULAR | Status: DC | PRN
  Administered 2023-05-27: 20 mL

## 2023-05-27 MED ORDER — CHLORHEXIDINE GLUCONATE 0.12 % MT SOLN: 15.0000 mL | Freq: Once | OROMUCOSAL | Status: AC

## 2023-05-27 MED ORDER — DEXAMETHASONE SODIUM PHOSPHATE 10 MG/ML IJ SOLN
INTRAMUSCULAR | Status: AC
  Filled 2023-05-27: qty 1

## 2023-05-27 MED ORDER — LIDOCAINE-EPINEPHRINE 1 %-1:100000 IJ SOLN
INTRAMUSCULAR | Status: DC | PRN
  Administered 2023-05-27: 20 mL

## 2023-05-27 MED ORDER — THROMBIN 5000 UNITS EX SOLR
OROMUCOSAL | Status: DC | PRN
  Administered 2023-05-27: 5 mL via TOPICAL

## 2023-05-27 MED ORDER — FLEET ENEMA RE ENEM
1.0000 | ENEMA | Freq: Once | RECTAL | Status: AC | PRN
  Administered 2023-05-31: 1 via RECTAL
  Filled 2023-05-27: qty 1

## 2023-05-27 MED ORDER — ACETAMINOPHEN 10 MG/ML IV SOLN
INTRAVENOUS | Status: DC | PRN
  Administered 2023-05-27: 1000 mg via INTRAVENOUS

## 2023-05-27 MED ORDER — AMISULPRIDE (ANTIEMETIC) 5 MG/2ML IV SOLN: 10.0000 mg | Freq: Once | INTRAVENOUS | Status: DC | PRN

## 2023-05-27 MED ORDER — SUGAMMADEX SODIUM 200 MG/2ML IV SOLN
INTRAVENOUS | Status: DC | PRN
  Administered 2023-05-27: 300 mg via INTRAVENOUS

## 2023-05-27 MED ORDER — 0.9 % SODIUM CHLORIDE (POUR BTL) OPTIME
TOPICAL | Status: DC | PRN
  Administered 2023-05-27 (×2): 1000 mL

## 2023-05-27 MED ORDER — PHENYLEPHRINE HCL-NACL 20-0.9 MG/250ML-% IV SOLN
INTRAVENOUS | Status: DC | PRN
  Administered 2023-05-27: 30 ug/min via INTRAVENOUS

## 2023-05-27 SURGICAL SUPPLY — 72 items
BAG COUNTER SPONGE SURGICOUNT (BAG) ×4 IMPLANT
BLADE CLIPPER SURG (BLADE) IMPLANT
BUR 14 MATCH 3 (BUR) ×1 IMPLANT
BUR ACORN 6.0 (BURR) IMPLANT
BUR MATCHSTICK NEURO 3.0 LAGG (BURR) ×2 IMPLANT
BURR 14 MATCH 3 (BUR) ×2 IMPLANT
CANISTER SUCT 3000ML PPV (MISCELLANEOUS) ×2 IMPLANT
CEMENT KYPHON C01A KIT/MIXER (Cement) ×1 IMPLANT
CNTNR URN SCR LID CUP LEK RST (MISCELLANEOUS) ×2 IMPLANT
COVER BACK TABLE 60X90IN (DRAPES) ×2 IMPLANT
COVERAGE SUPPORT O-ARM STEALTH (MISCELLANEOUS) ×2 IMPLANT
DERMABOND ADVANCED .7 DNX12 (GAUZE/BANDAGES/DRESSINGS) ×5 IMPLANT
DEVICE DISSECT PLASMABLAD 3.0S (MISCELLANEOUS) ×2 IMPLANT
DRAPE C-ARM 42X72 X-RAY (DRAPES) ×2 IMPLANT
DRAPE C-ARMOR (DRAPES) ×2 IMPLANT
DRAPE HALF SHEET 40X57 (DRAPES) IMPLANT
DRAPE LAPAROTOMY 100X72X124 (DRAPES) ×2 IMPLANT
DRAPE LAPAROTOMY T 102X78X121 (DRAPES) ×2 IMPLANT
DRAPE MICROSCOPE SLANT 54X150 (MISCELLANEOUS) IMPLANT
DRAPE WARM FLUID 44X44 (DRAPES) ×1 IMPLANT
DRSG OPSITE 11X17.75 LRG (GAUZE/BANDAGES/DRESSINGS) ×2 IMPLANT
DURAPREP 26ML APPLICATOR (WOUND CARE) ×4 IMPLANT
ELECT REM PT RETURN 9FT ADLT (ELECTROSURGICAL) ×4 IMPLANT
ELECTRODE REM PT RTRN 9FT ADLT (ELECTROSURGICAL) ×4 IMPLANT
FEE COVERAGE SUPPORT O-ARM (MISCELLANEOUS) IMPLANT
GAUZE 4X4 16PLY ~~LOC~~+RFID DBL (SPONGE) IMPLANT
GAUZE SPONGE 4X4 12PLY STRL (GAUZE/BANDAGES/DRESSINGS) ×2 IMPLANT
GLOVE BIOGEL PI IND STRL 8.5 (GLOVE) ×4 IMPLANT
GLOVE ECLIPSE 8.5 STRL (GLOVE) ×4 IMPLANT
GLOVE EXAM NITRILE XL STR (GLOVE) IMPLANT
GOWN STRL REUS W/ TWL LRG LVL3 (GOWN DISPOSABLE) IMPLANT
GOWN STRL REUS W/ TWL XL LVL3 (GOWN DISPOSABLE) ×4 IMPLANT
GOWN STRL REUS W/TWL 2XL LVL3 (GOWN DISPOSABLE) ×8 IMPLANT
HEMOSTAT POWDER KIT SURGIFOAM (HEMOSTASIS) ×2 IMPLANT
KIT BASIN OR (CUSTOM PROCEDURE TRAY) ×4 IMPLANT
KIT POSITION SURG JACKSON T1 (MISCELLANEOUS) ×1 IMPLANT
KIT TURNOVER KIT B (KITS) ×2 IMPLANT
MARKER SKIN DUAL TIP RULER LAB (MISCELLANEOUS) ×2 IMPLANT
MARKER SPHERE PSV REFLC NDI (MISCELLANEOUS) ×5 IMPLANT
NDL HYPO 22X1.5 SAFETY MO (MISCELLANEOUS) ×1 IMPLANT
NDL HYPO 25X1 1.5 SAFETY (NEEDLE) ×1 IMPLANT
NDL SPNL 20GX3.5 QUINCKE YW (NEEDLE) IMPLANT
NEEDLE HYPO 22X1.5 SAFETY MO (MISCELLANEOUS) ×2 IMPLANT
NEEDLE HYPO 25X1 1.5 SAFETY (NEEDLE) ×4 IMPLANT
NEEDLE SPNL 20GX3.5 QUINCKE YW (NEEDLE) IMPLANT
NS IRRIG 1000ML POUR BTL (IV SOLUTION) ×4 IMPLANT
PACK LAMINECTOMY NEURO (CUSTOM PROCEDURE TRAY) ×4 IMPLANT
PAD ARMBOARD 7.5X6 YLW CONV (MISCELLANEOUS) ×12 IMPLANT
PATTIES SURGICAL .5 X1 (DISPOSABLE) ×2 IMPLANT
PIN BONE FIX 100 (PIN) ×1 IMPLANT
PLASMABLADE 3.0S (MISCELLANEOUS) ×2 IMPLANT
PUSHER RELINE FENS MAS (MISCELLANEOUS) ×8 IMPLANT
RELINE MAS NDL FENS (NEEDLE) IMPLANT
RELINE MAS NEEDLE FENS (NEEDLE) ×16 IMPLANT
ROD MA STRT RELINE 5.5X120 (Rod) ×1 IMPLANT
ROD MA STRT RELINE 5.5X130 (Rod) ×1 IMPLANT
SCREW LOCK RELINE 5.5 TULIP (Screw) ×8 IMPLANT
SCREW RELINE PA 2FC 6.5X50 (Screw) ×4 IMPLANT
SCREW RELINE PA 6.5X45 (Screw) ×4 IMPLANT
SPIKE FLUID TRANSFER (MISCELLANEOUS) ×2 IMPLANT
SPONGE SURGIFOAM ABS GEL SZ50 (HEMOSTASIS) ×2 IMPLANT
SPONGE T-LAP 4X18 ~~LOC~~+RFID (SPONGE) IMPLANT
SUT VIC AB 0 CT1 18XCR BRD8 (SUTURE) ×1 IMPLANT
SUT VIC AB 1 CT1 18XBRD ANBCTR (SUTURE) ×4 IMPLANT
SUT VIC AB 2-0 CP2 18 (SUTURE) ×3 IMPLANT
SUT VIC AB 3-0 SH 8-18 (SUTURE) ×5 IMPLANT
SUT VIC AB 4-0 RB1 18 (SUTURE) ×5 IMPLANT
TIP FENS REPLACE RELINE (MISCELLANEOUS) ×8 IMPLANT
TOWEL GREEN STERILE (TOWEL DISPOSABLE) ×4 IMPLANT
TOWEL GREEN STERILE FF (TOWEL DISPOSABLE) ×4 IMPLANT
TRAY FOLEY MTR SLVR 16FR STAT (SET/KITS/TRAYS/PACK) ×2 IMPLANT
WATER STERILE IRR 1000ML POUR (IV SOLUTION) ×4 IMPLANT

## 2023-05-27 NOTE — Progress Notes (Signed)
[facility] PROGRESS NOTE  Roy [name] (DOB: 1951-02-08) ZOX:[id number] PCP: Rebecka Apley, NP  Brief Narrative: Roy [name] is a 73 y.o. male with hx of primary adrenal insufficiency on chronic glucocorticoid/mineralocorticoid, ankylosing spondylitis, CHB s/p PPM May 2024, PAF on eliquis, HTN, HLD, stage IV DLBCL s/p R-CHOP with no residual disease on last imaging who presented to the ED 1/17 with back pain after a fall. Attempting to enter his truck 1/16, he slipped off the step and fell backwards onto the ground.  At the time he had what he describes as muscle spasms on both flanks but no pain in his mid back.  He was able to get up and walk after the fall.  The following day, he sat up and suddenly heard a pop and had severe midline lower back pain which did not radiate.  He has chronic neuropathy in both legs but says this sensation is worse on the left and has hypoesthesia up to the knee on the left, hypoesthesia to the ankle on the right.  Other than his chronic neuropathy symptoms he has no new numbness.  No weakness or incontinence.   Imaging in the ED was concerning for unstable T11 fracture for which neurosurgery recommended transfer to [facility] for surgical management, strict spinal precautions, and allowing eliquis washout.  Subjective:  Patient seen in the morning rounds.  Congestion is better.  Denies any pain.  He had discussed case with surgery and was agreeable to go for surgery today.  Objective: BP 118/76   Pulse 60   Temp 98.4 F (36.9 C)   Resp 18   Ht 6' (1.829 m)   Wt 135.2 kg   SpO2 97%   BMI 40.42 kg/m   Gen: Chronically sick looking gentleman.  Not in any distress.  Pleasant interaction. Pulm: Clear, no added sounds. CV: RRR, no MRG  GI: Soft, NT, ND, +BS Neuro: Alert and oriented, intact motor function throughout. distal sensory impairment in feet and ankles symmetrically, chronically. No new focal deficits. Ext: Warm, dry, trace dependent  edema. Skin: No rashes, lesions or ulcers on visualized skin   Assessment & Plan:  Unstable T11-12 Chance fracture, 2 mm anterolisthesis, additional fracture posterior elements of T10-11:  -Followed by neurosurgery, patient is to maintain strict spinal precautions, remain flat, logroll for turns. Will need surgical fixation after Eliquis washout (last dose 1/17 AM), avoid anticoagulation.  Surgical plan for today. -MRI of the thoracic spine.  Pending. - Condom cath for urinary management, if not effective would place Foley - Continue analgesia with tylenol prn mild, oxycodone 2.5mg -5mg  prn mod-severe pain, and IV dilaudid 0.5mg  prn breakthrough pain. - Started on calcium and vitamin D supplementation due to his chronic glucocorticoid use.   Acute hypoxic respiratory failure: CT noting pleural effusion and bibasilar atelectasis, chest x-ray with note of low volumes and left basilar atelectasis.  Suspect likely due to hypoventilation secondary to pain from his vertebral fracture. Suspect hypoventilation due to pain, immobility. Viral swab negative.  - Incentive spirometry is stressed.  - Continue supplemental oxygen as needed to maintain normal WOB and SpO2 >89%.   He is on room air now.  Ankylosing spondylitis by imaging:  - He will need to have a referral to rheumatology to evaluate for therapy for ankylosing spondylitis.  Likely would benefit from PFTs to evaluate for restrictive lung disease as well.   Primary adrenal insufficiency:  - Continue home prednisone 10 mg, fludrocortisone 0.1 mg daily. BP and metabolic profile  are unremarkable.  - If he develops any hypotension may need stress dosing sooner, but otherwise, would likely give perioperatively.   Vitamin D deficiency: Level is 20.35.  - Start supplementation w/50k units weekly  Leukocytosis: Afebrile, suspected to be reactive to fractures.   CHB status post PPM, PAF:  - Holding DOAC for neurosurgery. Not on rate control agents,  rate ~60bpm.   HTN:  - Continue lasix 40 mg  HLD:  - Continue rosuvastatin  Peripheral neuropathy:  - Continue home lyrica  BPH:  - Continue home alfuzosin  Hypothyroidism: Recent TSH wnl. - Continue synthroid  Diffuse large B-cell lymphoma status post R-CHOP, no residual disease:  - Outpatient surveillance.   Dorcas Carrow, MD [facility] [email or web address] 05/27/2023, 11:46 AM

## 2023-05-27 NOTE — Anesthesia Procedure Notes (Signed)
Procedure Name: Intubation Date/Time: 05/27/2023 2:22 PM  Performed by: Allyn Kenner, CRNAPre-anesthesia Checklist: Patient identified, Emergency Drugs available, Suction available and Patient being monitored Patient Re-evaluated:Patient Re-evaluated prior to induction Oxygen Delivery Method: Circle System Utilized Preoxygenation: Pre-oxygenation with 100% oxygen Induction Type: IV induction Ventilation: Mask ventilation without difficulty, Two handed mask ventilation required and Oral airway inserted - appropriate to patient size Laryngoscope Size: Mac, Glidescope and 3 Grade View: Grade I Tube type: Oral Tube size: 7.0 mm Number of attempts: 1 Airway Equipment and Method: Stylet and Oral airway Placement Confirmation: ETT inserted through vocal cords under direct vision, positive ETCO2 and breath sounds checked- equal and bilateral Secured at: 22 cm Tube secured with: Tape Dental Injury: Teeth and Oropharynx as per pre-operative assessment

## 2023-05-27 NOTE — Plan of Care (Signed)
  Problem: Education: Goal: Knowledge of General Education information will improve Description: Including pain rating scale, medication(s)/side effects and non-pharmacologic comfort measures Outcome: Progressing   Problem: Health Behavior/Discharge Planning: Goal: Ability to manage health-related needs will improve Outcome: Progressing   Problem: Clinical Measurements: Goal: Ability to maintain clinical measurements within normal limits will improve Outcome: Progressing Goal: Will remain free from infection Outcome: Progressing Goal: Respiratory complications will improve Outcome: Progressing Goal: Cardiovascular complication will be avoided Outcome: Progressing   Problem: Nutrition: Goal: Adequate nutrition will be maintained Outcome: Progressing   Problem: Coping: Goal: Level of anxiety will decrease Outcome: Progressing   Problem: Activity: Goal: Risk for activity intolerance will decrease Outcome: Not Progressing

## 2023-05-27 NOTE — TOC CAGE-AID Note (Signed)
Transition of Care [facility]) - CAGE-AID Screening   Patient Details  Name: Roy [name] MRN: [id number] Date of Birth: Jul 15, 1950  Transition of Care [facility]) CM/SW Contact:    Janora Norlander, RN Phone Number: [phone] 05/27/2023, 5:40 PM   Clinical Narrative: Pt here after sustaining a T11-T12 fracture after slipping and falling onto his back.  Pt denies alcohol or drug use.  Screening complete.    CAGE-AID Screening:    Have You Ever Felt You Ought to Cut Down on Your Drinking or Drug Use?: No Have People Annoyed You By Critizing Your Drinking Or Drug Use?: No Have You Felt Bad Or Guilty About Your Drinking Or Drug Use?: No Have You Ever Had a Drink or Used Drugs First Thing In The Morning to Steady Your Nerves or to Get Rid of a Hangover?: No CAGE-AID Score: 0  Substance Abuse Education Offered: No

## 2023-05-27 NOTE — Op Note (Signed)
Date of surgery: 05/27/2023 Preoperative diagnosis: T11-T12 fracture dislocation secondary to ankylosing spondylitis Postoperative diagnosis: Same Procedure: Internal fixation of T11-T12 fracture dislocation with pedicle screw fixation from T10-L1 using O-arm for image guidance and methacrylate augmentation. Surgeon: Barnett Abu Anesthesia: General Tracheal Indications: Roy [name] is a 73 year old individual with an undiagnosed history of ankylosing spondylitis.  He had a slip from a standing height and fell onto his buttocks incurring severe back pain.  He was found to have a fracture dislocation otherwise known as a Chance fracture at the T11-T12 level consistent with his diagnosis of ankylosing spondylitis.  He is neurologically intact.  He is advised regarding the need for surgical stabilization at that level as he has a significant diathesis. Procedure: The patient was brought to the operating room supine on the stretcher.  After the smooth induction of general tracheal anesthesia he was carefully turned prone onto the [facility] table.  The bony prominences were appropriately padded and protected the arms were placed at his side.  Then his back was prepped with alcohol DuraPrep and draped in a sterile fashion.  Site over the right posterior suprailiac crest was infiltrated with lidocaine with epinephrine mixed 50-50 with half percent Marcaine.  A small vertical incision was created here and a guidepin was placed into the posterior suprailiac crest on the right side to allow attachment of the reference frame for the O-arm.  Then a spin was obtained centering on the area of T11-T12.  This included the vertebrae of the 10 T11-T12 and L1.  Once the spin was noted to be adequate we used the image guidance system to localize pedicle entry sites at T10-T11-T12 and L1.  The skin was marked and the area was infiltrated with a total volume of 40 cc of half percent Marcaine mixed 50-50 with 1% lidocaine with  epinephrine.  The small vertical incision was created of each of the chosen sites and the image guided drill was then used to drill pedicle entry sites into the pedicles of T10-T11-T12 and L1 first on the right side then on the left side.  Once the individual hole was drilled a 6.5 mm tap was used to tap down into the vertebral body and then at T10 and T11 6.5 x 45 mm screws were placed at T12 and L1 6.5 x 50 mm screws were placed into the vertebral bodies.  Once the screws were all placed a second spin was performed to verify their positions when was noted that they were adequate he then assembled the tower assembly to allow injection of methacrylate 1 cc into each of the pedicle screws.  This was done without image guidance but once of the towers were removed on the right side we placed 130 mm straight rod and on the left side we placed 120 mm straight rod.  The rod was connected in a neutral construct.  Then final radiographs were obtained with an AP and lateral fluoroscopic image.  Next when the images were verified the wounds were irrigated copiously and closed with 2-0 Vicryl in interrupted fashion and the fascia 3-0 Vicryl and 4-0 Vicryl in subcuticular tissues.  Dermabond was placed on the skin.  Blood loss for the entire procedure was estimated at 50 cc.

## 2023-05-27 NOTE — Progress Notes (Signed)
Spoke with Kalman Shan with [employer] and a rep will come in short stay.

## 2023-05-27 NOTE — Consult Note (Signed)
Reason for Consult: Chance fracture thoracic spine Referring Physician: Dr. Oralia [name] is an 73 y.o. male.  HPI: Patient is a 73 year old individual with an unknown history of ankylosing spondylitis who had a slip and fall while he was getting into his truck.  He fell onto his back and noted significant pain.  He has evidence of a fracture dislocation at the T11-T12 region.  He has been neurologically intact in the lower extremities.  He had a CT scan of the thoracic and lumbar spines.  He has been at bedrest since his admission.  He has been advised regarding the need for surgical decompression and stabilization of the thoracolumbar burst fracture as this is an unstable situation.  Past Medical History:  Diagnosis Date   Atrial fibrillation (HCC)    Cancer (HCC)    Diffuse Large B-Cell lymphoma   GERD (gastroesophageal reflux disease)    High cholesterol    Hypertension    Obesity     Past Surgical History:  Procedure Laterality Date   APPENDECTOMY     CATARACT EXTRACTION Bilateral 2022   CHOLECYSTECTOMY     ESOPHAGEAL DILATION     multiple times   IR IMAGING GUIDED PORT INSERTION  03/31/2021   JOINT REPLACEMENT Left    hip   PACEMAKER IMPLANT N/A 09/07/2022   Procedure: PACEMAKER IMPLANT;  Surgeon: Regan Lemming, MD;  Location: [facility];  Service: Cardiovascular;  Laterality: N/A;    Family History  Problem Relation Age of Onset   Heart failure Father    Heart attack Father        Deceased    Thyroid disease Sister     Social History:  reports that he has never smoked. He has never been exposed to tobacco smoke. He has never used smokeless tobacco. He reports that he does not drink alcohol and does not use drugs.  Allergies:  Allergies  Allergen Reactions   Adhesive [Tape] Other (See Comments)    Contact dermatitis   Sulfa Antibiotics Other (See Comments)    Unknown childhood reaction    Medications: I have reviewed the patient's current  medications.  Results for orders placed or performed during the hospital encounter of 05/24/23 (from the past 48 hours)  CBC     Status: Abnormal   Collection Time: 05/26/23  4:25 AM  Result Value Ref Range   WBC 11.5 (H) 4.0 - 10.5 K/uL   RBC 4.66 4.22 - 5.81 MIL/uL   Hemoglobin 14.5 13.0 - 17.0 g/dL   HCT 78.2 95.6 - 21.3 %   MCV 94.2 80.0 - 100.0 fL   MCH 31.1 26.0 - 34.0 pg   MCHC 33.0 30.0 - 36.0 g/dL   RDW 08.6 57.8 - 46.9 %   Platelets 168 150 - 400 K/uL   nRBC 0.0 0.0 - 0.2 %    Comment: Performed at [facility], [address], [place], [place] [address]  Basic metabolic panel     Status: Abnormal   Collection Time: 05/26/23  4:25 AM  Result Value Ref Range   Sodium 132 (L) 135 - 145 mmol/L   Potassium 3.6 3.5 - 5.1 mmol/L   Chloride 92 (L) 98 - 111 mmol/L   CO2 31 22 - 32 mmol/L   Glucose, Bld 95 70 - 99 mg/dL    Comment: Glucose reference range applies only to samples taken after fasting for at least 8 hours.   BUN 13 8 - 23  mg/dL   Creatinine, Ser 5.28 0.61 - 1.24 mg/dL   Calcium 9.0 8.9 - 41.3 mg/dL   GFR, Estimated >24 >40 mL/min    Comment: (NOTE) Calculated using the CKD-EPI Creatinine Equation (2021)    Anion gap 9 5 - 15    Comment: Performed at [facility], [address] [place], [place] [address]  MRSA Next Gen by PCR, Nasal     Status: None   Collection Time: 05/26/23  2:20 PM   Specimen: Nasal Mucosa; Nasal Swab  Result Value Ref Range   MRSA by PCR Next Gen NOT DETECTED NOT DETECTED    Comment: (NOTE) The GeneXpert MRSA Assay (FDA approved for NASAL specimens only), is one component of a comprehensive MRSA colonization surveillance program. It is not intended to diagnose MRSA infection nor to guide or monitor treatment for MRSA infections. Test performance is not FDA approved in patients less than 61 years old. Performed at [facility], [address], [place], [place] [address]     No results found.  Review of Systems   Constitutional:  Positive for activity change.  Musculoskeletal:  Positive for back pain and neck pain.  All other systems reviewed and are negative.  Blood pressure 95/61, pulse 61, temperature 98.7 F (37.1 C), temperature source Oral, resp. rate 14, height 6' (1.829 m), weight 135.2 kg, SpO2 95%. Physical Exam Constitutional:      Appearance: He is obese.  HENT:     Head: Normocephalic and atraumatic.     Right Ear: Tympanic membrane, ear canal and external ear normal.     Left Ear: Tympanic membrane, ear canal and external ear normal.     Nose: Nose normal.     Mouth/Throat:     Mouth: Mucous membranes are moist.     Pharynx: Oropharynx is clear.  Eyes:     Extraocular Movements: Extraocular movements intact.     Conjunctiva/sclera: Conjunctivae normal.     Pupils: Pupils are equal, round, and reactive to light.  Cardiovascular:     Rate and Rhythm: Normal rate. Rhythm irregular.     Pulses: Normal pulses.  Pulmonary:     Effort: Pulmonary effort is normal.     Breath sounds: Normal breath sounds.  Abdominal:     General: Abdomen is flat.     Palpations: Abdomen is soft.  Musculoskeletal:        General: Normal range of motion.     Cervical back: Normal range of motion and neck supple.  Skin:    General: Skin is warm and dry.     Capillary Refill: Capillary refill takes less than 2 seconds.  Neurological:     Mental Status: He is alert.     Comments: Patient is awake and alert.  Motor function lower extremities reveals iliopsoas and quadriceps are 4+ out of 5 to confrontational testing tibialis anterior and gastrocs are 5 out of 5.  Tone and bulk is intact.  Reflexes are 1+ in the patellae 1+ in the Achilles.  Babinski is downgoing.  Sensation is intact throughout the lower extremities all the way to the neck.  His upper extremity strength and reflexes are normal cranial nerve examination is normal.  Psychiatric:        Mood and Affect: Mood normal.        Behavior:  Behavior normal.        Thought Content: Thought content normal.        Judgment: Judgment normal.  Assessment/Plan: Patient has a Chance fracture of T11-T12.  He has chronic atrial fibrillation and has been on Xarelto.  This has been stopped since his admission and he is now ready for surgical intervention to stabilize his fracture at the T11-T12 level.  I have discussed the situation with the patient and explained to them the risks and benefits of the surgery.  Because of his advancing age we will augment the screws with methacrylate.  Roy [name] Nicole Hafley 05/27/2023, 9:50 AM

## 2023-05-27 NOTE — Transfer of Care (Signed)
Immediate Anesthesia Transfer of Care Note  Patient: Roy [name]  Procedure(s) Performed: Application of O-Arm POSTERIOR FIXATION AND FUSION OF THORACIC ELEVEN-THORACIC TWELVE FRACTURE DISLOCATION. FIXATION FROM THORACIC TEN-THORACIC ELEVEN METHACRYLATE AUGMENTATION WITH FLUOROSCOPIC GUIDANCE  Patient Location: PACU  Anesthesia Type:General  Level of Consciousness: awake, alert , and oriented  Airway & Oxygen Therapy: Patient Spontanous Breathing and Patient connected to face mask oxygen  Post-op Assessment: Report given to RN and Post -op Vital signs reviewed and stable  Post vital signs: Reviewed and stable  Last Vitals:  Vitals Value Taken Time  BP 114/68 05/27/23 1735  Temp    Pulse 69 05/27/23 1742  Resp 21 05/27/23 1742  SpO2 93 % 05/27/23 1742  Vitals shown include unfiled device data.  Last Pain:  Vitals:   05/27/23 1046  TempSrc:   PainSc: 5       Patients Stated Pain Goal: 0 (05/26/23 1915)  Complications: No notable events documented.

## 2023-05-27 NOTE — Anesthesia Preprocedure Evaluation (Addendum)
Anesthesia Evaluation  Patient identified by MRN, date of birth, ID band Patient awake    Reviewed: Allergy & Precautions, NPO status , Patient's Chart, lab work & pertinent test results  Airway Mallampati: IV  TM Distance: >3 FB Neck ROM: Full    Dental  (+) Dental Advisory Given   Pulmonary neg pulmonary ROS   breath sounds clear to auscultation       Cardiovascular hypertension, Pt. on medications +CHF  + dysrhythmias Atrial Fibrillation + pacemaker  Rhythm:Regular Rate:Normal     Neuro/Psych  Neuromuscular disease    GI/Hepatic Neg liver ROS,GERD  ,,  Endo/Other  Hypothyroidism  Adrenal insufficiency  Renal/GU negative Renal ROS     Musculoskeletal   Abdominal   Peds  Hematology negative hematology ROS (+)   Anesthesia Other Findings   Reproductive/Obstetrics                             Anesthesia Physical Anesthesia Plan  ASA: 3  Anesthesia Plan: General   Post-op Pain Management: Tylenol PO (pre-op)*   Induction: Intravenous  PONV Risk Score and Plan: 2 and Dexamethasone, Ondansetron and Treatment may vary due to age or medical condition  Airway Management Planned: Oral ETT  Additional Equipment:   Intra-op Plan:   Post-operative Plan: Extubation in OR  Informed Consent: I have reviewed the patients History and Physical, chart, labs and discussed the procedure including the risks, benefits and alternatives for the proposed anesthesia with the patient or authorized representative who has indicated his/her understanding and acceptance.     Dental advisory given  Plan Discussed with:   Anesthesia Plan Comments:        Anesthesia Quick Evaluation

## 2023-05-27 NOTE — Care Management Important Message (Signed)
Important Message  Patient Details  Name: Roy [name] MRN: [id number] Date of Birth: [birth year]   Important Message Given:  Yes - Medicare IM     Dorena Bodo 05/27/2023, 3:47 PM

## 2023-05-27 NOTE — Progress Notes (Signed)
Spoke with [employer] rep, Shari Prows. He is reaching out to [facility] regarding Mr. Igoe's pacemaker. Awaiting callback from [facility] [phone].

## 2023-05-28 ENCOUNTER — Inpatient Hospital Stay (HOSPITAL_COMMUNITY): Payer: 59

## 2023-05-28 DIAGNOSIS — S22082A Unstable burst fracture of T11-T12 vertebra, initial encounter for closed fracture: Secondary | ICD-10-CM | POA: Diagnosis not present

## 2023-05-28 MED ORDER — SODIUM CHLORIDE 0.9 % IV BOLUS
1000.0000 mL | Freq: Once | INTRAVENOUS | Status: AC
  Administered 2023-05-28: 1000 mL via INTRAVENOUS

## 2023-05-28 MED ORDER — METHYLPREDNISOLONE SODIUM SUCC 125 MG IJ SOLR
125.0000 mg | Freq: Once | INTRAMUSCULAR | Status: AC
  Administered 2023-05-28: 125 mg via INTRAVENOUS
  Filled 2023-05-28: qty 2

## 2023-05-28 NOTE — Plan of Care (Signed)

## 2023-05-28 NOTE — Evaluation (Signed)
Physical Therapy Evaluation Patient Details Name: Roy [name] MRN: [id number] DOB: 1951-01-09 Today's Date: 05/28/2023  History of Present Illness  73 yo male 1/17 s/p fall with unstable T11-T12 chance fx. Pt 1/20 internal fixation T11-T12 pedicule screw from T10-L1  PMH ankylosing spondylitis,adrenal insufficiency, CHB s/p PPM 09/2022, PAF on eliquis, HTN, HLD, stage IV DLBCL s/p R-CHOP, chronic neuropathy, L THA,  Clinical Impression   Pt presents with LE weakness, severe back pain, impaired sitting balance, and decreased activity tolerance. Pt to benefit from acute PT to address deficits. Pt requiring max +2 assist for to/from EOB, pt reporting dizziness and "wooziness" on EOB, BP and HR listed below. At baseline pt is independent with mobility and ADLs. Patient will benefit from intensive inpatient follow up therapy, >3 hours/day. PT to progress mobility as tolerated, and will continue to follow acutely.   Supine (BP, HR) 106/47, 60  Sitting 88/71, 65  Sitting after 2 min 76/65, 62  supine 102/60, 65    desats to 82% on RA, placed back on 4LO2 for duration of session and upon PT exit          If plan is discharge home, recommend the following: Two people to help with walking and/or transfers;Two people to help with bathing/dressing/bathroom   Can travel by private vehicle        Equipment Recommendations Other (comment) (tbd)  Recommendations for Other Services       Functional Status Assessment Patient has had a recent decline in their functional status and demonstrates the ability to make significant improvements in function in a reasonable and predictable amount of time.     Precautions / Restrictions Precautions Precautions: Fall;Back Precaution Comments: back handout provided and introduction to back precuations provided. Restrictions Weight Bearing Restrictions Per Provider Order: No      Mobility  Bed Mobility Overal bed mobility: Needs Assistance Bed  Mobility: Rolling, Supine to Sit, Sit to Supine Rolling: +2 for physical assistance, +2 for safety/equipment, Used rails, Mod assist   Supine to sit: Max assist, +2 for physical assistance, +2 for safety/equipment, HOB elevated, Used rails Sit to supine: Max assist, +2 for physical assistance, +2 for safety/equipment, HOB elevated, Used rails   General bed mobility comments: pt provided step by step cues for bed mobility . pt requires (A) to elevate trunk and sequence. pt static sitting with R UE support on footboard. pt with strong posterior lean. pt requires (A) to sustain static sitting mod (A)    Transfers                   General transfer comment: nt    Ambulation/Gait                  Stairs            Wheelchair Mobility     Tilt Bed    Modified Rankin (Stroke Patients Only)       Balance Overall balance assessment: Needs assistance Sitting-balance support: Bilateral upper extremity supported, Feet supported Sitting balance-Leahy Scale: Poor Sitting balance - Comments: posterior bias especially with LE movement Postural control: Posterior lean                                   Pertinent Vitals/Pain Pain Assessment Pain Assessment: 0-10 Pain Score: 7  Pain Location: L side of back Pain Descriptors / Indicators: Burning Pain Intervention(s): Limited activity within patient's  tolerance, Monitored during session, Repositioned, Premedicated before session    Home Living Family/patient expects to be discharged to:: Private residence Living Arrangements: Alone Available Help at Discharge: Family;Friend(s);Neighbor;Available PRN/intermittently Type of Home: House Home Access: Stairs to enter Entrance Stairs-Rails: Left;Right;Can reach both Entrance Stairs-Number of Steps: 3   Home Layout: One level Home Equipment: Agricultural consultant (2 wheels);Cane - single point;BSC/3in1 Additional Comments: son has hx of back problems but could  help at more supervision level. pt reports neighbors could help with food    Prior Function Prior Level of Function : Independent/Modified Independent;Driving             Mobility Comments: walking with cane ADLs Comments: indep     Extremity/Trunk Assessment   Upper Extremity Assessment Upper Extremity Assessment: Defer to OT evaluation    Lower Extremity Assessment Lower Extremity Assessment: Generalized weakness;LLE deficits/detail;RLE deficits/detail RLE Sensation: history of peripheral neuropathy LLE Sensation: history of peripheral neuropathy    Cervical / Trunk Assessment Cervical / Trunk Assessment: Back Surgery  Communication   Communication Communication: No apparent difficulties (has a slight mumble to speech/ wears dentures)  Cognition Arousal: Alert Behavior During Therapy: WFL for tasks assessed/performed Overall Cognitive Status: Within Functional Limits for tasks assessed                                          General Comments General comments (skin integrity, edema, etc.): desats to 82% on RA, placed back on 4LO2 for duration of session and upon PT exit    Exercises     Assessment/Plan    PT Assessment Patient needs continued PT services  PT Problem List Decreased strength;Decreased mobility;Decreased activity tolerance;Decreased balance;Decreased knowledge of use of DME;Pain;Cardiopulmonary status limiting activity;Decreased safety awareness       PT Treatment Interventions DME instruction;Therapeutic activities;Gait training;Therapeutic exercise;Patient/family education;Balance training;Stair training;Functional mobility training;Neuromuscular re-education    PT Goals (Current goals can be found in the Care Plan section)  Acute Rehab PT Goals Patient Stated Goal: home if able PT Goal Formulation: With patient Time For Goal Achievement: 06/11/23 Potential to Achieve Goals: Good    Frequency Min 1X/week      Co-evaluation PT/OT/SLP Co-Evaluation/Treatment: Yes Reason for Co-Treatment: Complexity of the patient's impairments (multi-system involvement);For patient/therapist safety;To address functional/ADL transfers PT goals addressed during session: Mobility/safety with mobility;Balance OT goals addressed during session: ADL's and self-care;Proper use of Adaptive equipment and DME;Strengthening/ROM       AM-PAC PT "6 Clicks" Mobility  Outcome Measure Help needed turning from your back to your side while in a flat bed without using bedrails?: A Lot Help needed moving from lying on your back to sitting on the side of a flat bed without using bedrails?: Total Help needed moving to and from a bed to a chair (including a wheelchair)?: Total Help needed standing up from a chair using your arms (e.g., wheelchair or bedside chair)?: Total Help needed to walk in hospital room?: Total Help needed climbing 3-5 steps with a railing? : Total 6 Click Score: 7    End of Session Equipment Utilized During Treatment: Oxygen Activity Tolerance: Patient tolerated treatment well;Patient limited by fatigue Patient left: in bed;with call bell/phone within reach;with bed alarm set;with SCD's reapplied Nurse Communication: Mobility status PT Visit Diagnosis: Other abnormalities of gait and mobility (R26.89);Muscle weakness (generalized) (M62.81)    Time: 3710-6269 PT Time Calculation (min) (ACUTE ONLY): 30 min  Charges:   PT Evaluation $PT Eval Low Complexity: 1 Low   PT General Charges $$ ACUTE PT VISIT: 1 Visit        Marye Round, PT DPT Acute Rehabilitation Services Secure Chat Preferred  Office [phone]   Lyndell Allaire [name] 05/28/2023, 11:37 AM

## 2023-05-28 NOTE — Progress Notes (Signed)
Inpatient Rehab Admissions Coordinator Note:   Per therapy recommendations patient was screened for CIR candidacy by Stephania Fragmin, PT. At this time, pt appears to be a potential candidate for CIR. I will place an order for rehab consult for full assessment, per our protocol.  Please contact me any with questions.Estill Dooms, PT, DPT [phone] 05/28/23 12:39 PM

## 2023-05-28 NOTE — Evaluation (Signed)
Occupational Therapy Evaluation Patient Details Name: Roy [name] MRN: [id number] DOB: August 28, 1950 Today's Date: 05/28/2023   History of Present Illness 73 yo male 1/17 s/p fall with unstable T11-T12 chance fx. Pt 1/20 internal fixation T11-T12 pedicule screw from T10-L1  PMH ankylosing spondylitis,adrenal insufficiency, CHB s/p PPM 09/2022, PAF on eliquis, HTN, HLD, stage IV DLBCL s/p R-CHOP, chronic neuropathy, L THA,   Clinical Impression   Patient is s/p T10-L1 fusion surgery resulting in functional limitations due to the deficits listed below (see OT problem list). Pt living alone and indep in all aspects PTA. Pt at this time limited by BP decrease with EOB sitting. Pt allowed increased time, bil LE movement and SCD without rebound of BP. Pt returned to supine and MD/RN notified. (See vitals) Patient will benefit from skilled OT acutely to increase independence and safety with ADLS to allow discharge Patient will benefit from intensive inpatient follow up therapy, >3 hours/day .  Orthostatic BPs  Supine 106/47  Sitting 88/71  Sitting after 2 min 76/65  supine 102/60      If plan is discharge home, recommend the following: Two people to help with walking and/or transfers;Two people to help with bathing/dressing/bathroom    Functional Status Assessment  Patient has had a recent decline in their functional status and demonstrates the ability to make significant improvements in function in a reasonable and predictable amount of time.  Equipment Recommendations  None recommended by OT    Recommendations for Other Services Rehab consult     Precautions / Restrictions Precautions Precautions: Fall;Back Precaution Comments: back handout provided and introduction to back precuations provided. Restrictions Weight Bearing Restrictions Per Provider Order: No      Mobility Bed Mobility Overal bed mobility: Needs Assistance Bed Mobility: Rolling, Supine to Sit, Sit to  Supine Rolling: +2 for physical assistance, +2 for safety/equipment, Used rails, Mod assist   Supine to sit: Max assist, +2 for physical assistance, +2 for safety/equipment, HOB elevated, Used rails Sit to supine: Max assist, +2 for physical assistance, +2 for safety/equipment, HOB elevated, Used rails   General bed mobility comments: pt provided step by step cues for bed mobility . pt requires (A) to elevate trunk and sequence. pt static sitting with R UE support on footboard. pt with strong posterior lean. pt requires (A) to sustain static sitting mod (A)    Transfers                   General transfer comment: nt tested      Balance Overall balance assessment: Needs assistance Sitting-balance support: Bilateral upper extremity supported, Feet supported Sitting balance-Leahy Scale: Poor   Postural control: Posterior lean                                 ADL either performed or assessed with clinical judgement   ADL Overall ADL's : Needs assistance/impaired Eating/Feeding: Minimal assistance;Bed level   Grooming: Minimal assistance;Bed level   Upper Body Bathing: Moderate assistance;Bed level   Lower Body Bathing: Maximal assistance                         General ADL Comments: pt progressed to eob and demonstrates drop in BP with eob sitting so returned to supine     Vision Baseline Vision/History: 1 Wears glasses Ability to See in Adequate Light: 0 Adequate Vision Assessment?: Wears glasses for reading  Perception         Praxis         Pertinent Vitals/Pain Pain Assessment Pain Assessment: 0-10 Pain Score: 7  Pain Location: L side of back Pain Descriptors / Indicators: Burning Pain Intervention(s): Premedicated before session, Monitored during session, Limited activity within patient's tolerance, Repositioned     Extremity/Trunk Assessment Upper Extremity Assessment Upper Extremity Assessment: Overall WFL for tasks  assessed   Lower Extremity Assessment Lower Extremity Assessment: Defer to PT evaluation   Cervical / Trunk Assessment Cervical / Trunk Assessment: Back Surgery   Communication Communication Communication: No apparent difficulties (has a slight mumble to speech/ wears dentures)   Cognition Arousal: Alert Behavior During Therapy: WFL for tasks assessed/performed Overall Cognitive Status: Within Functional Limits for tasks assessed                                       General Comments  incision dry and no drainage noted. bandage intact    Exercises     Shoulder Instructions      Home Living Family/patient expects to be discharged to:: Private residence Living Arrangements: Alone Available Help at Discharge: Family;Friend(s);Neighbor;Available PRN/intermittently Type of Home: House Home Access: Stairs to enter [employer] of Steps: 3 Entrance Stairs-Rails: Left;Right;Can reach both Home Layout: One level     Bathroom Shower/Tub: Producer, television/film/video: Standard Bathroom Accessibility: Yes   Home Equipment: Agricultural consultant (2 wheels);Cane - single point;BSC/3in1   Additional Comments: son has hx of back problems but could help at more supervision level. pt reports neighbors could help with food      Prior Functioning/Environment Prior Level of Function : Independent/Modified Independent;Driving             Mobility Comments: walking with cane ADLs Comments: indep        OT Problem List: Decreased strength;Decreased activity tolerance;Impaired balance (sitting and/or standing);Decreased safety awareness;Decreased knowledge of precautions;Decreased knowledge of use of DME or AE;Cardiopulmonary status limiting activity;Obesity;Pain      OT Treatment/Interventions: Self-care/ADL training;DME and/or AE instruction;Therapeutic activities;Cognitive remediation/compensation;Patient/family education;Balance  training;Modalities;Energy conservation;Neuromuscular education;Therapeutic exercise    OT Goals(Current goals can be found in the care plan section) Acute Rehab OT Goals Patient Stated Goal: to be able to get up OT Goal Formulation: With patient Time For Goal Achievement: 06/11/23 Potential to Achieve Goals: Good  OT Frequency: Min 1X/week    Co-evaluation PT/OT/SLP Co-Evaluation/Treatment: Yes Reason for Co-Treatment: Complexity of the patient's impairments (multi-system involvement);For patient/therapist safety;To address functional/ADL transfers   OT goals addressed during session: ADL's and self-care;Proper use of Adaptive equipment and DME;Strengthening/ROM      AM-PAC OT "6 Clicks" Daily Activity     Outcome Measure Help from another person eating meals?: A Little Help from another person taking care of personal grooming?: A Little Help from another person toileting, which includes using toliet, bedpan, or urinal?: A Lot Help from another person bathing (including washing, rinsing, drying)?: A Lot Help from another person to put on and taking off regular upper body clothing?: A Little Help from another person to put on and taking off regular lower body clothing?: A Lot 6 Click Score: 15   End of Session Nurse Communication: Mobility status;Precautions  Activity Tolerance: Patient tolerated treatment well Patient left: in bed;with call bell/phone within reach;with bed alarm set;with SCD's reapplied (positioned on L side lying with pillows. pt reports increased comfort and  he might go to sleep)  OT Visit Diagnosis: Unsteadiness on feet (R26.81);Muscle weakness (generalized) (M62.81)                Time: 0454-0981 OT Time Calculation (min): 52 min Charges:  OT General Charges $OT Visit: 1 Visit OT Evaluation $OT Eval Moderate Complexity: 1 Mod   Brynn, OTR/L  Acute Rehabilitation Services Office: [phone] .   Mateo Flow 05/28/2023, 11:18 AM

## 2023-05-28 NOTE — Progress Notes (Signed)
Patient ID: Roy [name], male   DOB: Jun 16, 1950, 73 y.o.   MRN: [id number] Vital signs are stable postop day 1 Motor function feels good and the patient is resting more comfortably.  He finds that lying flat is aggravating but he can sit up reasonably comfortably.  At this point I believe that he can be mobilized.  If he is tolerating activities on his feet I am hopeful for discharge soon.  A brace is not necessary.

## 2023-05-28 NOTE — Progress Notes (Signed)
[facility] PROGRESS NOTE  Roy [name] (DOB: [birth year]) GMW:[id number] PCP: Rebecka Apley, NP  Brief Narrative: Roy [name] is a 73 y.o. male with hx of primary adrenal insufficiency on chronic glucocorticoid/mineralocorticoid, ankylosing spondylitis, CHB s/p PPM May 2024, PAF on eliquis, HTN, HLD, stage IV DLBCL s/p R-CHOP with no residual disease on last imaging who presented to the ED 1/17 with back pain after a fall. Attempting to enter his truck 1/16, he slipped off the step and fell backwards onto the ground.  At the time he had what he describes as muscle spasms on both flanks but no pain in his mid back.  He was able to get up and walk after the fall.  The following day, he sat up and suddenly heard a pop and had severe midline lower back pain which did not radiate.  He has chronic neuropathy in both legs but says this sensation is worse on the left and has hypoesthesia up to the knee on the left, hypoesthesia to the ankle on the right.  Other than his chronic neuropathy symptoms he has no new numbness.  No weakness or incontinence.   Imaging in the ED was concerning for unstable T11 fracture for which neurosurgery recommended transfer to [facility] for surgical management, strict spinal precautions, and allowing eliquis washout. 1/20, patient underwent posterior spinal fixation Dr. Danielle Dess.  Subjective:  Patient seen in the morning rounds.  He was resting.  Denies any complaints.  Pain is controlled. I was notified that patient had orthostatic drop in his blood pressure while working with PT OT.  Patient has been laying flat for the last 4 days. Stress dose of steroids, Solu-Medrol 125 mg once IV fluid, normal saline 1 L once.  Monitor orthostatic symptoms.  Objective: BP 102/60 (BP Location: Left Arm)   Pulse 62   Temp 97.7 F (36.5 C) (Oral)   Resp 13   Ht 6' (1.829 m)   Wt 135.2 kg   SpO2 98%   BMI 40.42 kg/m   Gen: Looks fairly comfortable in the morning  rounds. Pulm: Clear, no added sounds. CV: RRR, no MRG  GI: Soft, NT, ND, +BS Neuro: Alert and oriented, intact motor function throughout. distal sensory impairment in feet and ankles symmetrically, chronically. No new focal deficits. Ext: Warm, dry, trace dependent edema. Skin: No rashes, lesions or ulcers on visualized skin   Assessment & Plan:  Unstable T11-12 Chance fracture, 2 mm anterolisthesis, additional fracture posterior elements of T10-11:  -Admitted from [facility]  Last dose of Eliquis 1/17 AM.   -Underwent posterior spinal fixation by neurosurgery 1/20.   -Adequate pain management, PT OT, orthostatic precautions.  Weightbearing as tolerated.  Referred to rehab.   - Started on calcium and vitamin D supplementation due to his chronic glucocorticoid use.   Acute hypoxic respiratory failure: CT noting pleural effusion and bibasilar atelectasis, chest x-ray with note of low volumes and left basilar atelectasis.  Suspect likely due to hypoventilation secondary to pain from his vertebral fracture. Suspect hypoventilation due to pain, immobility. Viral swab negative.  - Incentive spirometry is stressed.  - Continue supplemental oxygen as needed to maintain normal WOB and SpO2 >89%.    Ankylosing spondylitis by imaging:  - He will need to have a referral to rheumatology to evaluate for therapy for ankylosing spondylitis.  Likely would benefit from PFTs to evaluate for restrictive lung disease as well.   Primary adrenal insufficiency:  - Continue home prednisone 10 mg, fludrocortisone  0.1 mg daily. BP and metabolic profiles are unremarkable.  -1 dose of Solu-Medrol 125 mg today.  Vitamin D deficiency: Level is 20.35.  - Start supplementation w/50k units weekly  Leukocytosis: Afebrile, suspected to be reactive to fractures.  Stable.  CHB status post PPM, PAF:  - Holding DOAC for neurosurgery. Not on rate control agents, rate ~60bpm.  Resume Eliquis when cleared by  surgery.  HTN:  - Continue lasix 40 mg  HLD:  - Continue rosuvastatin  Peripheral neuropathy:  - Continue home lyrica  BPH:  - Continue home alfuzosin  Hypothyroidism: Recent TSH wnl. - Continue synthroid  Diffuse large B-cell lymphoma status post R-CHOP, no residual disease:  - Outpatient surveillance.   Dorcas Carrow, MD [facility] [email or web address] 05/28/2023, 12:55 PM

## 2023-05-29 ENCOUNTER — Encounter (HOSPITAL_COMMUNITY): Payer: Self-pay | Admitting: Neurological Surgery

## 2023-05-29 DIAGNOSIS — S22082A Unstable burst fracture of T11-T12 vertebra, initial encounter for closed fracture: Secondary | ICD-10-CM | POA: Diagnosis not present

## 2023-05-29 MED ORDER — MIDODRINE HCL 5 MG PO TABS
5.0000 mg | ORAL_TABLET | Freq: Three times a day (TID) | ORAL | Status: DC
  Administered 2023-05-29 – 2023-06-06 (×24): 5 mg via ORAL
  Filled 2023-05-29 (×24): qty 1

## 2023-05-29 MED ORDER — SODIUM CHLORIDE 0.9 % IV SOLN: INTRAVENOUS | Status: AC

## 2023-05-29 NOTE — Progress Notes (Addendum)
  Inpatient Rehabilitation Admissions Coordinator   Met with patient at bedside with therapy. Very orthostatic when up. I have left a voicemail for his son, Viviann Spare, to begin rehab discussions.   Ottie Glazier, RN, MSN Rehab Admissions Coordinator [phone] 05/29/2023 11:30 AM  Spoke to son, Viviann Spare. He will clarify with family how they may arrange 24/7 caregiver support after any rehab. Steven's wife works as a Music therapist at [employer] . They will check with family and friends and call me tomorrow. We reviewed goals and expectations of a possible CIR admit pending caregiver supports can be arranged.  Ottie Glazier, RN, MSN Rehab Admissions Coordinator [phone] 05/29/2023 11:41 AM

## 2023-05-29 NOTE — Progress Notes (Signed)
Physical Therapy Treatment Patient Details Name: Roy [name] MRN: [id number] DOB: February 15, 1951 Today's Date: 05/29/2023   History of Present Illness 73 yo male 1/17 s/p fall with unstable T11-T12 chance fx. Pt 1/20 internal fixation T11-T12 pedicule screw from T10-L1  PMH ankylosing spondylitis,adrenal insufficiency, CHB s/p PPM 09/2022, PAF on eliquis, HTN, HLD, stage IV DLBCL s/p R-CHOP, chronic neuropathy, L THA,    PT Comments  Pt seen in session with OT to address progression of mobility OOB. He required +2 max assist bed mobility, +2 mod assist sit to stand, and dependent transfer bed to recliner in stedy. Mobility significantly limited by orthostatic hypotension. Pt reporting no dizziness or other symptoms, but with BP drop note slurred speech and mild confusion. Pt in recliner with feet elevated at end of session. Had pt perform LE AROM exercises throughout session to help bring up BP. Pt would benefit from ted hose.                                                              BP Supine                                            121/67  Sitting EOB                                    107/79 Stand in stedy (after pivot)             89/53 Initial sit in recliner                         71/58     Reclined with feet elevated           128/70    If plan is discharge home, recommend the following: Two people to help with walking and/or transfers;Two people to help with bathing/dressing/bathroom   Can travel by private vehicle        Equipment Recommendations  Other (comment) (tbd)    Recommendations for Other Services       Precautions / Restrictions Precautions Precautions: Fall;Back Precaution Comments: reviewed back precautions     Mobility  Bed Mobility Overal bed mobility: Needs Assistance Bed Mobility: Rolling, Sidelying to Sit Rolling: Mod assist, +2 for physical assistance, Used rails Sidelying to sit: Max assist, +2 for physical assistance, HOB elevated        General bed mobility comments: cues for logroll, assist with BLE and trunk    Transfers Overall transfer level: Needs assistance   Transfers: Sit to/from Stand, Bed to chair/wheelchair/BSC Sit to Stand: +2 physical assistance, Mod assist, From elevated surface           General transfer comment: +2 mod STS from elevated bed in stedy, +2 min to stand from stedy seat but +2 mod to control descent into recliner. Dependent stedy transfer bed to recliner Transfer via Lift Equipment: Stedy  Ambulation/Gait               General Gait Details: unable due to +orthostatics   Stairs  Wheelchair Mobility     Tilt Bed    Modified Rankin (Stroke Patients Only)       Balance Overall balance assessment: Needs assistance Sitting-balance support: Bilateral upper extremity supported, Feet supported Sitting balance-Leahy Scale: Poor Sitting balance - Comments: external assist to maintain balance Postural control: Posterior lean Standing balance support: Bilateral upper extremity supported, Reliant on assistive device for balance Standing balance-Leahy Scale: Poor                              Cognition Arousal: Alert Behavior During Therapy: WFL for tasks assessed/performed Overall Cognitive Status: Within Functional Limits for tasks assessed                                          Exercises      General Comments General comments (skin integrity, edema, etc.): SpO2 stable on 3L. Orthostatic BP: supine 121/67, sitting EOB 107/79, stand in stedy 89/53, sitting upright in recliner 71/58, reclined with feet elevated 128/70      Pertinent Vitals/Pain Pain Assessment Pain Assessment: Faces Faces Pain Scale: Hurts little more Pain Location: back Pain Descriptors / Indicators: Discomfort, Guarding, Grimacing Pain Intervention(s): Limited activity within patient's tolerance, Monitored during session, Repositioned    Home  Living                          Prior Function            PT Goals (current goals can now be found in the care plan section) Acute Rehab PT Goals Patient Stated Goal: home Progress towards PT goals: Progressing toward goals    Frequency    Min 1X/week      PT Plan      Co-evaluation PT/OT/SLP Co-Evaluation/Treatment: Yes Reason for Co-Treatment: Complexity of the patient's impairments (multi-system involvement);For patient/therapist safety;To address functional/ADL transfers PT goals addressed during session: Mobility/safety with mobility;Balance        AM-PAC PT "6 Clicks" Mobility   Outcome Measure  Help needed turning from your back to your side while in a flat bed without using bedrails?: A Lot Help needed moving from lying on your back to sitting on the side of a flat bed without using bedrails?: Total Help needed moving to and from a bed to a chair (including a wheelchair)?: Total Help needed standing up from a chair using your arms (e.g., wheelchair or bedside chair)?: Total Help needed to walk in hospital room?: Total Help needed climbing 3-5 steps with a railing? : Total 6 Click Score: 7    End of Session Equipment Utilized During Treatment: Oxygen;Gait belt Activity Tolerance: Patient tolerated treatment well;Treatment limited secondary to medical complications (Comment) (+orthostatics) Patient left: in chair;with call bell/phone within reach;with chair alarm set Nurse Communication: Need for lift equipment;Mobility status PT Visit Diagnosis: Other abnormalities of gait and mobility (R26.89);Muscle weakness (generalized) (M62.81)     Time: 7829-5621 PT Time Calculation (min) (ACUTE ONLY): 26 min  Charges:    $Therapeutic Activity: 8-22 mins PT General Charges $$ ACUTE PT VISIT: 1 Visit                     Ferd Glassing., PT  Office # [phone]    Ilda Foil 05/29/2023, 12:16 PM

## 2023-05-29 NOTE — Plan of Care (Signed)

## 2023-05-29 NOTE — Anesthesia Postprocedure Evaluation (Signed)
Anesthesia Post Note  Patient: Roy [name]  Procedure(s) Performed: Application of O-Arm POSTERIOR FIXATION AND FUSION OF THORACIC ELEVEN-THORACIC TWELVE FRACTURE DISLOCATION. FIXATION FROM THORACIC TEN-THORACIC ELEVEN METHACRYLATE AUGMENTATION WITH FLUOROSCOPIC GUIDANCE     Patient location during evaluation: PACU Anesthesia Type: General Level of consciousness: awake and alert Pain management: pain level controlled Vital Signs Assessment: post-procedure vital signs reviewed and stable Respiratory status: spontaneous breathing, nonlabored ventilation, respiratory function stable and patient connected to nasal cannula oxygen Cardiovascular status: blood pressure returned to baseline and stable Postop Assessment: no apparent nausea or vomiting Anesthetic complications: no   No notable events documented.  Last Vitals:  Vitals:   05/29/23 1200 05/29/23 1613  BP: 104/60 121/65  Pulse: 60 60  Resp: 14 17  Temp: 36.7 C 36.9 C  SpO2: 100% 95%    Last Pain:  Vitals:   05/29/23 1613  TempSrc: Oral  PainSc:                  Kennieth Rad

## 2023-05-29 NOTE — Progress Notes (Signed)
[facility] PROGRESS NOTE  Roy [name] (DOB: 01-Jan-1951) [id number] PCP: Rebecka Apley, NP  Brief Narrative: Roy [name] is a 73 y.o. male with hx of primary adrenal insufficiency on chronic glucocorticoid/mineralocorticoid, ankylosing spondylitis, CHB s/p PPM May 2024, PAF on eliquis, HTN, HLD, stage IV DLBCL s/p R-CHOP with no residual disease on last imaging who presented to the ED 1/17 with back pain after a fall. Attempting to enter his truck 1/16, he slipped off the step and fell backwards onto the ground.  At the time he had what he describes as muscle spasms on both flanks but no pain in his mid back.  He was able to get up and walk after the fall.  The following day, he sat up and suddenly heard a pop and had severe midline lower back pain which did not radiate.  He has chronic neuropathy in both legs but says this sensation is worse on the left and has hypoesthesia up to the knee on the left, hypoesthesia to the ankle on the right.  Other than his chronic neuropathy symptoms he has no new numbness.  No weakness or incontinence.   Imaging in the ED was concerning for unstable T11 fracture for which neurosurgery recommended transfer to [facility] for surgical management, strict spinal precautions, and allowing eliquis washout. 1/20, patient underwent posterior spinal fixation Dr. Danielle Dess.  Subjective:  Patient seen and examined in the morning rounds.  Denied any complaints. He continued to have severe drop in orthostatic blood pressure with mobility. He was given 1 L of fluid and 1 dose of Solu-Medrol yesterday. Will start patient on midodrine 5 mg 3 times daily, normal saline 40 mL/h.  Objective: BP 104/60 (BP Location: Left Arm)   Pulse 60   Temp 98 F (36.7 C) (Oral)   Resp 14   Ht 6' (1.829 m)   Wt 135.2 kg   SpO2 100%   BMI 40.42 kg/m   Gen: Looks comfortable. Pulm: Clear, no added sounds. CV: RRR, no MRG  GI: Soft, NT, ND, +BS Neuro: Alert and oriented,  intact motor function throughout. distal sensory impairment in feet and ankles symmetrically, chronically. No new focal deficits. Ext: Warm, dry, trace dependent edema. Skin: No rashes, lesions or ulcers on visualized skin   Assessment & Plan:  Unstable T11-12 Chance fracture, 2 mm anterolisthesis, additional fracture posterior elements of T10-11:  -Admitted from [facility].  Last dose of Eliquis 1/17 AM.   -Underwent posterior spinal fixation by neurosurgery 1/20.   -Adequate pain management, PT OT, orthostatic precautions.  Weightbearing as tolerated.  Referred to rehab.   -Started on calcium and vitamin D supplementation due to his chronic glucocorticoid use. -Eliquis when cleared by surgery.   Acute hypoxic respiratory failure: CT noting pleural effusion and bibasilar atelectasis, chest x-ray with note of low volumes and left basilar atelectasis.  Suspect likely due to hypoventilation secondary to pain from his vertebral fracture. Suspect hypoventilation due to pain, immobility. Viral swab negative.  - Incentive spirometry is stressed.  - Continue supplemental oxygen as needed to maintain normal WOB and SpO2 >89%.    Ankylosing spondylitis by imaging:  - He will need to have a referral to rheumatology to evaluate for therapy for ankylosing spondylitis.  Likely would benefit from PFTs to evaluate for restrictive lung disease as well.   Primary adrenal insufficiency:  - Continue home prednisone 10 mg, fludrocortisone 0.1 mg daily. BP and metabolic profiles are unremarkable.  -1 dose of Solu-Medrol 125  mg today.  Vitamin D deficiency: Level is 20.35.  - Start supplementation w/50k units weekly  Leukocytosis: Afebrile, suspected to be reactive to fractures.  Stable.  CHB status post PPM, PAF:  - Holding DOAC for neurosurgery. Not on rate control agents, rate ~60bpm.  Resume Eliquis when cleared by surgery.  HTN:  -Hold Lasix.  Orthostatic.  HLD:  - Continue  rosuvastatin  Peripheral neuropathy:  - Continue home lyrica  BPH:  - Continue home alfuzosin  Hypothyroidism: Recent TSH wnl. - Continue synthroid  Diffuse large B-cell lymphoma status post R-CHOP, no residual disease:  - Outpatient surveillance.   Dorcas Carrow, MD [facility] [email or web address] 05/29/2023, 12:45 PM

## 2023-05-29 NOTE — Progress Notes (Signed)
  Inpatient Rehabilitation Admissions Coordinator   Received call from son ,Viviann Spare. He can not arrange 24/7 care after a CIR admit. They wish to pursue SNF and prefer [facility] where patient's daughter in law works. I have updated TOC. We will sign off.  Ottie Glazier, RN, MSN Rehab Admissions Coordinator [phone] 05/29/2023 4:11 PM

## 2023-05-29 NOTE — Progress Notes (Signed)
Patient ID: Roy [name], male   DOB: [birth year], 73 y.o.   MRN: [id number] Vital signs are stable.  Patient notes he had orthostasis when they tried to mobilize him yesterday.  Hopefully he will overcome this and can be mobilized today.  Motor function appears good in the lower extremities.  When he is ambulating adequately he can be discharged from my perspective.  Patient may be allowed to shower.

## 2023-05-29 NOTE — Progress Notes (Signed)
Occupational Therapy Treatment Patient Details Name: Roy [name] MRN: [id number] DOB: 1950-07-10 Today's Date: 05/29/2023   History of present illness 73 yo male 1/17 s/p fall with unstable T11-T12 chance fx. Pt 1/20 internal fixation T11-T12 pedicule screw from T10-L1  PMH ankylosing spondylitis,adrenal insufficiency, CHB s/p PPM 09/2022, PAF on eliquis, HTN, HLD, stage IV DLBCL s/p R-CHOP, chronic neuropathy, L THA,   OT comments  Pt progressing toward established OT goals this session with pt limited by orthostatic BP. Seen in collaboration with PT to progress mobility and optimize safety. Challenging pt recall of precautions, activity tolerance, and mobility this session. Needing max A of 2 for bed mobility and mod A of 2 for transfer up from EOB with bed slightly elevated. Pt denies dizziness throughout but notable change in cognition and mildly slurred speech with drop in BP. PT reached out to MD about BP management. Will continue to follow. Continues to highly benefit from acute OT and recommending inpatient rehab >3 hours/day.   BP Supine                                            121/67  Sitting EOB                                    107/79 Stand in stedy (after pivot)             89/53 Initial sit in recliner                         71/58     Reclined with feet elevated           128/70      If plan is discharge home, recommend the following:  Two people to help with walking and/or transfers;Two people to help with bathing/dressing/bathroom   Equipment Recommendations  None recommended by OT    Recommendations for Other Services Rehab consult    Precautions / Restrictions Precautions Precautions: Fall;Back Precaution Comments: reviewed back precautions Restrictions Weight Bearing Restrictions Per Provider Order: No       Mobility Bed Mobility Overal bed mobility: Needs Assistance Bed Mobility: Rolling, Sidelying to Sit Rolling: Mod assist, +2 for physical  assistance, Used rails Sidelying to sit: Max assist, +2 for physical assistance, HOB elevated       General bed mobility comments: cues for logroll, assist with BLE and trunk    Transfers Overall transfer level: Needs assistance Equipment used: Ambulation equipment used (stedy) Transfers: Sit to/from Stand, Bed to chair/wheelchair/BSC Sit to Stand: +2 physical assistance, Mod assist, From elevated surface           General transfer comment: +2 mod STS from elevated bed in stedy, +2 min to stand from stedy seat but +2 mod to control descent into recliner. Dependent stedy transfer bed to recliner Transfer via Lift Equipment: Stedy   Balance Overall balance assessment: Needs assistance Sitting-balance support: Bilateral upper extremity supported, Feet supported Sitting balance-Leahy Scale: Poor Sitting balance - Comments: external assist to maintain balance Postural control: Posterior lean Standing balance support: Bilateral upper extremity supported, Reliant on assistive device for balance Standing balance-Leahy Scale: Poor  ADL either performed or assessed with clinical judgement   ADL Overall ADL's : Needs assistance/impaired                         Toilet Transfer: Moderate assistance;+2 for physical assistance;+2 for safety/equipment Toilet Transfer Details (indicate cue type and reason): stedy; simulated to recliner                Extremity/Trunk Assessment              Vision       Perception     Praxis      Cognition Arousal: Alert Behavior During Therapy: WFL for tasks assessed/performed Overall Cognitive Status: Within Functional Limits for tasks assessed                                          Exercises      Shoulder Instructions       General Comments SpO2 stable on 3L. Orthostatic BP: supine 121/67, sitting EOB 107/79, stand in stedy 89/53, sitting upright in recliner  71/58, reclined with feet elevated 128/70    Pertinent Vitals/ Pain       Pain Assessment Pain Assessment: Faces Faces Pain Scale: Hurts little more Pain Location: back Pain Descriptors / Indicators: Discomfort, Guarding, Grimacing Pain Intervention(s): Limited activity within patient's tolerance, Monitored during session  Home Living                                          Prior Functioning/Environment              Frequency  Min 1X/week        Progress Toward Goals  OT Goals(current goals can now be found in the care plan section)  Progress towards OT goals: Progressing toward goals  Acute Rehab OT Goals Patient Stated Goal: get to rehab OT Goal Formulation: With patient Time For Goal Achievement: 06/11/23 Potential to Achieve Goals: Good ADL Goals Pt Will Perform Upper Body Bathing: with set-up;sitting Pt Will Transfer to Toilet: with mod assist;bedside commode;ambulating (AE PRN) Additional ADL Goal #1: pt will complete bed mobility with rails min (A) Additional ADL Goal #2: pt will complete sit<>Stand with RW total +2 mod (A)  Plan      Co-evaluation    PT/OT/SLP Co-Evaluation/Treatment: Yes Reason for Co-Treatment: Complexity of the patient's impairments (multi-system involvement);For patient/therapist safety;To address functional/ADL transfers PT goals addressed during session: Mobility/safety with mobility;Balance OT goals addressed during session: ADL's and self-care;Proper use of Adaptive equipment and DME;Strengthening/ROM      AM-PAC OT "6 Clicks" Daily Activity     Outcome Measure   Help from another person eating meals?: A Little Help from another person taking care of personal grooming?: A Little Help from another person toileting, which includes using toliet, bedpan, or urinal?: A Lot Help from another person bathing (including washing, rinsing, drying)?: A Lot Help from another person to put on and taking off regular  upper body clothing?: A Little Help from another person to put on and taking off regular lower body clothing?: A Lot 6 Click Score: 15    End of Session Equipment Utilized During Treatment: Gait belt;Rolling walker (2 wheels)  OT Visit Diagnosis: Unsteadiness on feet (R26.81);Muscle weakness (generalized) (M62.81)  Activity Tolerance Patient tolerated treatment well   Patient Left in chair;with call bell/phone within reach;with chair alarm set   Nurse Communication Mobility status;Precautions;Other (comment) (orthostatics; MD also made aware)        Time: 1110-1131 OT Time Calculation (min): 21 min  Charges: OT General Charges $OT Visit: 1 Visit OT Treatments $Therapeutic Activity: 8-22 mins  Tyler Deis, OTR/L [facility] Acute Rehabilitation Office: [phone]   Myrla Halsted 05/29/2023, 2:35 PM

## 2023-05-30 DIAGNOSIS — S22082A Unstable burst fracture of T11-T12 vertebra, initial encounter for closed fracture: Secondary | ICD-10-CM | POA: Diagnosis not present

## 2023-05-30 MED ORDER — APIXABAN 5 MG PO TABS
5.0000 mg | ORAL_TABLET | Freq: Two times a day (BID) | ORAL | Status: DC
  Administered 2023-05-30 – 2023-06-06 (×15): 5 mg via ORAL
  Filled 2023-05-30 (×15): qty 1

## 2023-05-30 NOTE — Progress Notes (Signed)
[facility] PROGRESS NOTE  Roy [name] (DOB: [birth year]) ZOX:[id number] PCP: Rebecka Apley, NP  Brief Narrative: Roy [name] is a 73 y.o. male with hx of primary adrenal insufficiency on chronic glucocorticoid/mineralocorticoid, ankylosing spondylitis, CHB s/p PPM May 2024, PAF on eliquis, HTN, HLD, stage IV DLBCL s/p R-CHOP with no residual disease on last imaging who presented to the ED 1/17 with back pain after a fall. Attempting to enter his truck 1/16, he slipped off the step and fell backwards onto the ground.  At the time he had what he describes as muscle spasms on both flanks but no pain in his mid back.  He was able to get up and walk after the fall.  The following day, he sat up and suddenly heard a pop and had severe midline lower back pain which did not radiate.  He has chronic neuropathy in both legs but says this sensation is worse on the left and has hypoesthesia up to the knee on the left, hypoesthesia to the ankle on the right.  Other than his chronic neuropathy symptoms he has no new numbness.  No weakness or incontinence.   Imaging in the ED was concerning for unstable T11 fracture for which neurosurgery recommended transfer to [facility] for surgical management, strict spinal precautions, and allowing eliquis washout. 1/20, patient underwent posterior spinal fixation Dr. Danielle Dess.  Subjective:  Patient seen and examined.  Denies any complaints at rest.  He is using incentive.  Now on room air. Continue to have orthostatic drop in blood pressure while attempting to work with rehab, currently on maintenance IV fluids.  Also started on midodrine.  Hopefully today will be better today. Does not have adequate support after discharge from rehab, so recommended SNF and he is agreeable.  Denies any pain today.  Objective: BP (!) 107/59 (BP Location: Left Arm)   Pulse 61   Temp 97.9 F (36.6 C) (Oral)   Resp 18   Ht 6' (1.829 m)   Wt 135.2 kg   SpO2 95%   BMI 40.42  kg/m   Gen: Looks comfortable. Pulm: Clear, no added sounds.  On room air. CV: RRR, no MRG  GI: Soft, NT, ND, +BS Neuro: Alert and oriented, intact motor function throughout. distal sensory impairment in feet and ankles symmetrically, chronically. No new focal deficits. Postop surgical dressing looks clean and intact. Ext: Warm, dry, trace dependent edema. Skin: No rashes, lesions or ulcers on visualized skin   Assessment & Plan:  Unstable T11-12 Chance fracture, 2 mm anterolisthesis, additional fracture posterior elements of T10-11:  -Admitted from [facility].  Last dose of Eliquis 1/17 AM.   -Underwent posterior spinal fixation by neurosurgery 1/20.   -Adequate pain management, PT OT, orthostatic precautions.  Weightbearing as tolerated.  Referred to SNF -Started on calcium and vitamin D supplementation due to his chronic glucocorticoid use. -Will resume Eliquis today.   Acute hypoxic respiratory failure: CT noting pleural effusion and bibasilar atelectasis, chest x-ray with note of low volumes and left basilar atelectasis.  Suspect likely due to hypoventilation secondary to pain from his vertebral fracture. Suspect hypoventilation due to pain, immobility. Viral swab negative.  - Incentive spirometry is stressed.  - Continue supplemental oxygen as needed to maintain normal WOB and SpO2 >89%.   Now on room air.  Ankylosing spondylitis by imaging:  - He will need to have a referral to rheumatology to evaluate for therapy for ankylosing spondylitis.  Likely would benefit from PFTs to evaluate  for restrictive lung disease as well.  Currently able to wean off to the room air.   Primary adrenal insufficiency:  - Continue home prednisone 10 mg, fludrocortisone 0.1 mg daily. BP and metabolic profiles are unremarkable.  -1 dose of Solu-Medrol 125 mg  given for low blood pressure.  Vitamin D deficiency: Level is 20.35.  - Start supplementation w/50k units weekly  Leukocytosis:  Afebrile, suspected to be reactive to fractures.  Stable.  CHB status post PPM, PAF:  - Holding DOAC for neurosurgery. Not on rate control agents, rate ~60bpm.  Resume Eliquis today.  HTN:  -Hold Lasix.  Orthostatic.  HLD:  - Continue rosuvastatin  Peripheral neuropathy:  - Continue home lyrica  BPH:  - Continue home alfuzosin  Hypothyroidism: Recent TSH wnl. - Continue synthroid  Diffuse large B-cell lymphoma status post R-CHOP, no residual disease:  - Outpatient surveillance.   Medically stabilizing.  Hopefully he can transfer to a SNF once he is able to work with PT OT without getting orthostatic issues.   Dorcas Carrow, MD [facility] [email or web address] 05/30/2023, 11:41 AM

## 2023-05-30 NOTE — Progress Notes (Signed)
Physical Therapy Treatment Patient Details Name: Roy [name] MRN: [id number] DOB: 1951/01/21 Today's Date: 05/30/2023   History of Present Illness 73 yo male 1/17 s/p fall with unstable T11-T12 chance fx. Pt 1/20 internal fixation T11-T12 pedicule screw from T10-L1  PMH ankylosing spondylitis,adrenal insufficiency, CHB s/p PPM 09/2022, PAF on eliquis, HTN, HLD, stage IV DLBCL s/p R-CHOP, chronic neuropathy, L THA,    PT Comments  Pt with tender and tight abdomen, states he needs to have a BM and would like to get OOB to try. Pt requiring mod +2 assist for transfer-level mobility, tolerating repeated stands to get to and from toilet. Pt with liquid-type stool, NT and RN aware. Pt sitting and standing balance improving, standing for 1.5 minutes at a time in stedy and for pericare. Pt progressing, will continue to progress pt as able.      If plan is discharge home, recommend the following: Two people to help with walking and/or transfers;Two people to help with bathing/dressing/bathroom   Can travel by private vehicle     No  Equipment Recommendations  Other (comment) (defer)    Recommendations for Other Services       Precautions / Restrictions Precautions Precautions: Fall;Back Precaution Comments: reviewed back precautions Restrictions Weight Bearing Restrictions Per Provider Order: No     Mobility  Bed Mobility Overal bed mobility: Needs Assistance Bed Mobility: Rolling, Sidelying to Sit Rolling: Mod assist, Used rails Sidelying to sit: Max assist, HOB elevated       General bed mobility comments: assist for log roll technique to EOB, cues for technique    Transfers Overall transfer level: Needs assistance Equipment used: Ambulation equipment used Transfers: Sit to/from Stand Sit to Stand: Mod assist, +2 physical assistance           General transfer comment: stand x3, from EOB and standard height toilet x2. Assist for rise, steady, hip extension to full  upright via posterior faciliation, and slow eccentric lower to destination surface. Transfer via Lift Equipment: Stedy  Ambulation/Gait               General Gait Details: nt   Building services engineer [place] (Stroke Patients Only)       Balance Overall balance assessment: Needs assistance Sitting-balance support: Bilateral upper extremity supported, Feet supported Sitting balance-Leahy Scale: Poor Sitting balance - Comments: external assist to maintain balance Postural control: Posterior lean Standing balance support: Bilateral upper extremity supported, Reliant on assistive device for balance Standing balance-Leahy Scale: Poor                              Cognition Arousal: Alert Behavior During Therapy: WFL for tasks assessed/performed Overall Cognitive Status: Within Functional Limits for tasks assessed                                          Exercises General Exercises - Lower Extremity Long Arc Quad: AROM, Both, 10 reps, Seated Hip Flexion/Marching: AROM, Both, 10 reps, Seated    General Comments General comments (skin integrity, edema, etc.): SPO2 90% and greater on RA, PT reapplied 1LO2 at end of session as this is what pt was on upon PT arrival. BP stable today  Pertinent Vitals/Pain Pain Assessment Pain Assessment: Faces Faces Pain Scale: Hurts little more Pain Location: back Pain Descriptors / Indicators: Discomfort, Guarding, Grimacing Pain Intervention(s): Limited activity within patient's tolerance, Monitored during session, Repositioned    Home Living                          Prior Function            PT Goals (current goals can now be found in the care plan section) Acute Rehab PT Goals Patient Stated Goal: home PT Goal Formulation: With patient Time For Goal Achievement: 06/11/23 Potential to Achieve Goals: Fair Progress towards PT goals:  Progressing toward goals    Frequency    Min 1X/week      PT Plan      Co-evaluation              AM-PAC PT "6 Clicks" Mobility   Outcome Measure  Help needed turning from your back to your side while in a flat bed without using bedrails?: A Lot Help needed moving from lying on your back to sitting on the side of a flat bed without using bedrails?: A Lot Help needed moving to and from a bed to a chair (including a wheelchair)?: Total Help needed standing up from a chair using your arms (e.g., wheelchair or bedside chair)?: Total Help needed to walk in hospital room?: Total Help needed climbing 3-5 steps with a railing? : Total 6 Click Score: 8    End of Session   Activity Tolerance: Patient tolerated treatment well Patient left: in chair;with call bell/phone within reach;with chair alarm set Nurse Communication: Mobility status;Need for lift equipment PT Visit Diagnosis: Other abnormalities of gait and mobility (R26.89);Muscle weakness (generalized) (M62.81)     Time: 0160-1093 PT Time Calculation (min) (ACUTE ONLY): 24 min  Charges:    $Therapeutic Activity: 8-22 mins $Neuromuscular Re-education: 8-22 mins PT General Charges $$ ACUTE PT VISIT: 1 Visit                     Marye Round, PT DPT Acute Rehabilitation Services Secure Chat Preferred  Office [phone]    Roy [name] [name] 05/30/2023, 5:09 PM

## 2023-05-30 NOTE — Progress Notes (Signed)
Patient ID: Roy [name], male   DOB: [birth year], 73 y.o.   MRN: [id number] Roy [name] signs are stable Patient is alert and motor function appears good His ambulation has been minimal although he notes that he was out of bed his bladder and bowel control is working.  He had a bowel movement yesterday.  From neurosurgical standpoint he can be discharged when ready medically.  Continue to follow while in the hospital.

## 2023-05-30 NOTE — Progress Notes (Addendum)
Pt previously on room air. Patient sating at 84% while sleeping, RN placed 2L [place] on patient. Now sating at 97%.

## 2023-05-30 NOTE — [place] FL2 (Signed)
[place] MEDICAID FL2 LEVEL OF CARE FORM     IDENTIFICATION  Patient Name: Roy [name] Birthdate: 04-13-1951 Sex: male Admission Date (Current Location): 05/24/2023  [facility] and [place] Number:  [employer] and Address:  [facility] [place], [address] [place], [place] [address]      Provider Number: [id number]  Attending Physician Name and Address:  Dorcas Carrow, MD  Relative Name and Phone Number:       Current Level of Care: Hospital Recommended Level of Care: Skilled Nursing Facility Prior Approval Number:    Date Approved/Denied:   PASRR Number: [id number]  Discharge Plan: SNF    Current Diagnoses: Patient Active Problem List   Diagnosis Date Noted   T11 vertebral fracture (HCC) 05/24/2023   Urge incontinence 11/05/2022   Symptomatic bradycardia 09/05/2022   Acute congestive heart failure (HCC) 09/05/2022   Parotid neoplasm 12/28/2021   Mononeuropathy of left lower limb 12/28/2021   Neutropenic fever (HCC) 07/14/2021   Hypothyroidism 07/14/2021   Hypotension 07/14/2021   Hyperlipidemia 07/14/2021   BPH (benign prostatic hyperplasia) 07/14/2021   GERD (gastroesophageal reflux disease) 07/14/2021   Leukopenia    Thrombocytopenia (HCC)    Large B-cell lymphoma (HCC) 03/22/2021   Varicose veins of bilateral lower extremities with pain 02/02/2021   Venous (peripheral) insufficiency 02/02/2021   Subacute thyroiditis 01/20/2021   Adrenal insufficiency (HCC) 01/10/2021   Hyponatremia 01/08/2021   Weakness 01/08/2021   Nonrheumatic aortic valve stenosis 12/20/2020   Hyperthyroidism 10/22/2020   Coagulopathy (HCC) 10/21/2020   Obesity (BMI 30-39.9) 05/04/2015   Chronic anticoagulation 05/04/2015   Trifascicular block 04/29/2015   LAFB (left anterior fascicular block) 04/29/2015   Essential hypertension    Hypokalemia 04/28/2015   RBBB 04/28/2015   First degree AV block 04/28/2015   Atrial fibrillation (HCC) 04/27/2015     Orientation RESPIRATION BLADDER Height & Weight     Self, Time, Situation, Place  Normal Continent Weight: 298 lb (135.2 kg) Height:  6' (182.9 cm)  BEHAVIORAL SYMPTOMS/MOOD NEUROLOGICAL BOWEL NUTRITION STATUS      Continent, Incontinent Diet (please se discharge summary)  AMBULATORY STATUS COMMUNICATION OF NEEDS Skin   Extensive Assist Verbally Surgical wounds (closed incision back)                       Personal Care Assistance Level of Assistance  Bathing, Feeding, Dressing Bathing Assistance: Maximum assistance Feeding assistance: Independent Dressing Assistance: Limited assistance     Functional Limitations Info  Sight, Hearing, Speech Sight Info: Adequate (eyeglasses) Hearing Info: Adequate Speech Info: Adequate    SPECIAL CARE FACTORS FREQUENCY  PT (By licensed PT), OT (By licensed OT)     PT Frequency: 5x per week OT Frequency: 5x per week            Contractures Contractures Info: Not present    Additional Factors Info  Code Status, Allergies Code Status Info: FULL Allergies Info: adhesive tape,Sulfa Antibiotics           Current Medications (05/30/2023):  This is the current hospital active medication list Current Facility-Administered Medications  Medication Dose Route Frequency Provider Last Rate Last Admin   acetaminophen (TYLENOL) tablet 650 mg  650 mg Oral Q4H PRN Barnett Abu, MD       Or   acetaminophen (TYLENOL) suppository 650 mg  650 mg Rectal Q4H PRN Barnett Abu, MD       albuterol (PROVENTIL) (2.5 MG/3ML) 0.083% nebulizer solution 3 mL  3 mL Nebulization Q6H PRN Barnett Abu, MD       alfuzosin (UROXATRAL) 24 hr tablet 10 mg  10 mg Oral Q breakfast Barnett Abu, MD   10 mg at 05/30/23 0826   alum & mag hydroxide-simeth (MAALOX/MYLANTA) 200-200-20 MG/5ML suspension 30 mL  30 mL Oral Q6H PRN Barnett Abu, MD   30 mL at 05/30/23 0825   apixaban (ELIQUIS) tablet 5 mg  5 mg Oral BID Dorcas Carrow, MD   5 mg at 05/30/23 1157    bisacodyl (DULCOLAX) suppository 10 mg  10 mg Rectal Daily PRN Barnett Abu, MD   10 mg at 05/29/23 2126   calcium carbonate (OS-CAL - dosed in mg of elemental calcium) tablet 1,250 mg  1 tablet Oral BID WC Barnett Abu, MD   1,250 mg at 05/30/23 6387   cyanocobalamin (VITAMIN B12) tablet 1,000 mcg  1,000 mcg Oral Daily Barnett Abu, MD   1,000 mcg at 05/30/23 5643   docusate sodium (COLACE) capsule 100 mg  100 mg Oral BID Barnett Abu, MD   100 mg at 05/30/23 0825   ferrous sulfate tablet 325 mg  325 mg Oral Rudell Cobb, MD   325 mg at 05/29/23 3295   fludrocortisone (FLORINEF) tablet 0.1 mg  0.1 mg Oral Daily Barnett Abu, MD   0.1 mg at 05/30/23 0826   guaiFENesin (ROBITUSSIN) 100 MG/5ML liquid 10 mL  10 mL Oral Q6H PRN Barnett Abu, MD   10 mL at 05/26/23 1839   HYDROmorphone (DILAUDID) injection 0.5 mg  0.5 mg Intravenous Q2H PRN Barnett Abu, MD   0.5 mg at 05/28/23 0022   levothyroxine (SYNTHROID) tablet 100 mcg  100 mcg Oral Q0600 Barnett Abu, MD   100 mcg at 05/30/23 0532   magnesium oxide (MAG-OX) tablet 400 mg  400 mg Oral Daily Barnett Abu, MD   400 mg at 05/30/23 0825   melatonin tablet 6 mg  6 mg Oral QHS PRN Barnett Abu, MD   6 mg at 05/28/23 2212   menthol-cetylpyridinium (CEPACOL) lozenge 3 mg  1 lozenge Oral PRN Barnett Abu, MD       Or   phenol (CHLORASEPTIC) mouth spray 1 spray  1 spray Mouth/Throat PRN Barnett Abu, MD       methocarbamol (ROBAXIN) tablet 500 mg  500 mg Oral Q6H PRN Barnett Abu, MD   500 mg at 05/30/23 1884   Or   methocarbamol (ROBAXIN) injection 500 mg  500 mg Intravenous Q6H PRN Barnett Abu, MD       midodrine (PROAMATINE) tablet 5 mg  5 mg Oral TID WC Dorcas Carrow, MD   5 mg at 05/30/23 1157   ondansetron (ZOFRAN) injection 4 mg  4 mg Intravenous Q6H PRN Barnett Abu, MD       oxyCODONE-acetaminophen (PERCOCET/ROXICET) 5-325 MG per tablet 1-2 tablet  1-2 tablet Oral Q6H PRN Barnett Abu, MD   1 tablet at 05/30/23 0839    pantoprazole (PROTONIX) EC tablet 40 mg  40 mg Oral Daily Barnett Abu, MD   40 mg at 05/30/23 0826   polyethylene glycol (MIRALAX / GLYCOLAX) packet 17 g  17 g Oral Daily PRN Barnett Abu, MD   17 g at 05/30/23 0826   predniSONE (DELTASONE) tablet 10 mg  10 mg Oral Q breakfast Barnett Abu, MD   10 mg at 05/30/23 0825   pregabalin (LYRICA) capsule 200 mg  200 mg Oral BID Barnett Abu, MD   200 mg at 05/30/23 0825  rosuvastatin (CRESTOR) tablet 5 mg  5 mg Oral QHS Barnett Abu, MD   5 mg at 05/29/23 2121   senna (SENOKOT) tablet 8.6 mg  1 tablet Oral BID Barnett Abu, MD   8.6 mg at 05/30/23 0825   sodium chloride flush (NS) 0.9 % injection 3 mL  3 mL Intravenous Lance Muss, MD   3 mL at 05/29/23 2125   sodium chloride flush (NS) 0.9 % injection 3 mL  3 mL Intravenous Q12H Barnett Abu, MD   3 mL at 05/30/23 0831   sodium chloride flush (NS) 0.9 % injection 3 mL  3 mL Intravenous PRN Barnett Abu, MD       sodium phosphate (FLEET) enema 1 enema  1 enema Rectal Once PRN Barnett Abu, MD       valACYclovir Ralph Dowdy) tablet 1,000 mg  1,000 mg Oral Daily Barnett Abu, MD   1,000 mg at 05/30/23 8413   Vitamin D (Ergocalciferol) (DRISDOL) 1.25 MG (50000 UNIT) capsule 50,000 Units  50,000 Units Oral Q7 days Barnett Abu, MD   50,000 Units at 05/25/23 1756   Facility-Administered Medications Ordered in Other Encounters  Medication Dose Route Frequency Provider Last Rate Last Admin   sodium chloride flush (NS) 0.9 % injection 10 mL  10 mL Intravenous PRN Doreatha Massed, MD   10 mL at 04/01/23 1000     Discharge Medications: Please see discharge summary for a list of discharge medications.  Relevant Imaging Results:  Relevant Lab Results:   Additional Information SSN [id number]  Eduard Roux, LCSW

## 2023-05-30 NOTE — TOC Progression Note (Signed)
Transition of Care [facility]) - Progression Note    Patient Details  Name: Roy [name] MRN: [id number] Date of Birth: [birth year]  Transition [facility]) CM/SW Contact  Eduard Roux, [place] Phone Number: 05/30/2023, 1:19 PM  Clinical Narrative:      CSW spoke with patient by phone- CSW introduced self and explained role. CSW advised was informed by IR admission coordinator, family can not provide 24/7 care. Patient confirms and states he is aware of recommendations for short term rehab at SNF. Preferred SNF is [facility] but is agreeable to send referrals to others in the area.   TOC will provide bed offers once available.   Antony Blackbird, MSW, LCSW Clinical Social Worker     Expected Discharge Plan: Skilled Nursing Facility Barriers to Discharge: Continued Medical Work up, SNF Pending bed offer, Insurance Authorization  Expected Discharge Plan and Services In-house Referral: Clinical Social Work                                             Social Determinants of Health (SDOH) Interventions SDOH Screenings   Food Insecurity: No Food Insecurity (05/25/2023)  Housing: Low Risk  (05/25/2023)  Transportation Needs: No Transportation Needs (05/25/2023)  Utilities: Not At Risk (05/25/2023)  Alcohol Screen: Low Risk  (11/08/2020)  Depression (PHQ2-9): Low Risk  (11/08/2020)  Financial Resource Strain: Low Risk  (05/15/2023)   Received from [facility]  Physical Activity: Unknown (04/25/2023)   Received from [facility]  Recent Concern: Physical Activity - Inactive (04/25/2023)   Received from [facility]  Social Connections: Moderately Isolated (05/25/2023)  Stress: No Stress Concern Present (04/25/2023)   Received from [facility]  Tobacco Use: Low Risk  (05/27/2023)    Readmission Risk Interventions    07/17/2021   12:49 PM  Readmission Risk Prevention Plan  Transportation Screening Complete  HRI or Home Care Consult Complete  Social Work Consult for  Recovery Care Planning/Counseling Complete  Palliative Care Screening Not Applicable  Medication Review Oceanographer) Complete

## 2023-05-30 NOTE — Progress Notes (Signed)
Patient weaned from 3L [place] to 2L [place]. Sating between 93-96%. Regular and unlabored respiratory pattern noted.

## 2023-05-31 DIAGNOSIS — S22082A Unstable burst fracture of T11-T12 vertebra, initial encounter for closed fracture: Secondary | ICD-10-CM | POA: Diagnosis not present

## 2023-05-31 DIAGNOSIS — J9601 Acute respiratory failure with hypoxia: Secondary | ICD-10-CM | POA: Diagnosis not present

## 2023-05-31 LAB — CBC WITH DIFFERENTIAL/PLATELET
Abs Immature Granulocytes: 0.06 10*3/uL (ref 0.00–0.07)
Basophils Absolute: 0 10*3/uL (ref 0.0–0.1)
Basophils Relative: 0 %
Eosinophils Absolute: 0.1 10*3/uL (ref 0.0–0.5)
Eosinophils Relative: 1 %
HCT: 42.9 % (ref 39.0–52.0)
Hemoglobin: 14.7 g/dL (ref 13.0–17.0)
Immature Granulocytes: 1 %
Lymphocytes Relative: 19 %
Lymphs Abs: 2.2 10*3/uL (ref 0.7–4.0)
MCH: 31.7 pg (ref 26.0–34.0)
MCHC: 34.3 g/dL (ref 30.0–36.0)
MCV: 92.7 fL (ref 80.0–100.0)
Monocytes Absolute: 0.7 10*3/uL (ref 0.1–1.0)
Monocytes Relative: 6 %
Neutro Abs: 8.5 10*3/uL — ABNORMAL HIGH (ref 1.7–7.7)
Neutrophils Relative %: 73 %
Platelets: 209 10*3/uL (ref 150–400)
RBC: 4.63 MIL/uL (ref 4.22–5.81)
RDW: 14.2 % (ref 11.5–15.5)
WBC: 11.6 10*3/uL — ABNORMAL HIGH (ref 4.0–10.5)
nRBC: 0.2 % (ref 0.0–0.2)

## 2023-05-31 MED ORDER — MAGNESIUM CITRATE PO SOLN
1.0000 | Freq: Once | ORAL | Status: AC
  Administered 2023-05-31: 1 via ORAL
  Filled 2023-05-31: qty 296

## 2023-05-31 NOTE — Progress Notes (Signed)
Patient ID: Roy [name], male   DOB: 1951/02/02, 73 y.o.   MRN: [id number] Vital signs are stable patient's motor function remains good.  He has walked some today hopefully plans are being made for transfer to a skilled nursing facility.  I am hopeful that his stay there will be short.  I will be out of town this weekend.  Neurosurgical call coverage is available if necessary.

## 2023-05-31 NOTE — TOC Progression Note (Signed)
Transition of Care [facility]) - Progression Note    Patient Details  Name: Roy [name] MRN: [id number] Date of Birth: [birth year]  Transition of Care [facility]) CM/SW Contact  Eduard Roux, [place] Phone Number: 05/31/2023, 2:21 PM  Clinical Narrative:     Spoke with patient's son, Viviann Spare- informed [facility] declined- informed of other bed offers. Viviann Spare states he will review and let CSW know of his SNF choice.  Called [facility]- inquired if they can review again for possible placement - admissions states they will review - waiting on determination  TOC will continue to follow and assist with discharge planning.  Antony Blackbird, MSW, LCSW Clinical Social Worker      Expected Discharge Plan: Skilled Nursing Facility Barriers to Discharge: Continued Medical Work up, SNF Pending bed offer, Insurance Authorization  Expected Discharge Plan and Services In-house Referral: Clinical Social Work                                             Social Determinants of Health (SDOH) Interventions SDOH Screenings   Food Insecurity: No Food Insecurity (05/25/2023)  Housing: Low Risk  (05/25/2023)  Transportation Needs: No Transportation Needs (05/25/2023)  Utilities: Not At Risk (05/25/2023)  Alcohol Screen: Low Risk  (11/08/2020)  Depression (PHQ2-9): Low Risk  (11/08/2020)  Financial Resource Strain: Low Risk  (05/15/2023)   Received from [facility]  Physical Activity: Unknown (04/25/2023)   Received from [facility]  Recent Concern: Physical Activity - Inactive (04/25/2023)   Received from [facility]  Social Connections: Moderately Isolated (05/25/2023)  Stress: No Stress Concern Present (04/25/2023)   Received from [facility]  Tobacco Use: Low Risk  (05/27/2023)    Readmission Risk Interventions    07/17/2021   12:49 PM  Readmission Risk Prevention Plan  Transportation Screening Complete  HRI or Home Care Consult Complete  Social Work Consult for Recovery  Care Planning/Counseling Complete  Palliative Care Screening Not Applicable  Medication Review Oceanographer) Complete

## 2023-05-31 NOTE — Progress Notes (Signed)
[facility] PROGRESS NOTE  Roy [name] (DOB: 1951/04/16) JYN:[id number] PCP: Rebecka Apley, NP  Brief Narrative: Roy [name] is a 73 y.o. male with hx of primary adrenal insufficiency on chronic glucocorticoid/mineralocorticoid, ankylosing spondylitis, CHB s/p PPM May 2024, PAF on eliquis, HTN, HLD, stage IV DLBCL s/p R-CHOP with no residual disease on last imaging who presented to the ED 1/17 with back pain after a fall. Attempting to enter his truck 1/16, he slipped off the step and fell backwards onto the ground.  At the time he had what he describes as muscle spasms on both flanks but no pain in his mid back.  He was able to get up and walk after the fall.  The following day, he sat up and suddenly heard a pop and had severe midline lower back pain which did not radiate.  He has chronic neuropathy in both legs but says this sensation is worse on the left and has hypoesthesia up to the knee on the left, hypoesthesia to the ankle on the right.  Other than his chronic neuropathy symptoms he has no new numbness.  No weakness or incontinence.   Imaging in the ED was concerning for unstable T11 fracture for which neurosurgery recommended transfer to [facility] for surgical management, strict spinal precautions, and allowing eliquis washout. 1/20, patient underwent posterior spinal fixation Dr. Danielle Dess.  Subjective:  C/o constipation    Objective: BP 117/70 (BP Location: Left Arm)   Pulse 61   Temp 97.9 F (36.6 C) (Oral)   Resp (!) 21   Ht 6' (1.829 m)   Wt 135.2 kg   SpO2 90%   BMI 40.42 kg/m     General: Appearance:    Severely obese male in no acute distress   Obese abd  Lungs:     On [place] respirations unlabored  Heart:    Normal heart rate.     MS:   All extremities are intact.   Neurologic:   Awake, alert, oriented x 3. No apparent focal neurological           defect.     Assessment & Plan:  Unstable T11-12 Chance fracture, 2 mm anterolisthesis, additional fracture  posterior elements of T10-11:  -Admitted from [facility].  Last dose of Eliquis 1/17 AM.   -Underwent posterior spinal fixation by neurosurgery 1/20.   -Adequate pain management, PT OT  Weightbearing as tolerated.  Referred to SNF -Started on calcium and vitamin D supplementation due to his chronic glucocorticoid use.   Acute hypoxic respiratory failure: CT noting pleural effusion and bibasilar atelectasis, chest x-ray with note of low volumes and left basilar atelectasis.  Suspect likely due to hypoventilation secondary to pain from his vertebral fracture. Suspect hypoventilation due to pain, immobility. Viral swab negative.  - Incentive spirometry is stressed.  - Continue supplemental oxygen as needed to maintain normal WOB and SpO2 >89%.   -O2 low while sleeping as I suspect sleep apnea-- will need outpatient follow up  Ankylosing spondylitis by imaging:  - He will need to have a referral to rheumatology to evaluate for therapy for ankylosing spondylitis.  Likely would benefit from PFTs to evaluate for restrictive lung disease as well.  Currently able to wean off to the room air.   Primary adrenal insufficiency:  - Continue home prednisone 10 mg, fludrocortisone 0.1 mg daily. BP and metabolic profiles are unremarkable.   Vitamin D deficiency: Level is 20.35.  - Start supplementation w/50k units weekly  Leukocytosis:  Afebrile, suspected to be reactive to fractures.  Stable.  CHB status post PPM, PAF:  -eliquis -not on rate control agent  HTN:  -monitor orthostatics   HLD:  - Continue rosuvastatin  Peripheral neuropathy:  - Continue home lyrica  BPH:  - Continue home alfuzosin  Hypothyroidism: Recent TSH wnl. - Continue synthroid  Diffuse large B-cell lymphoma status post R-CHOP, no residual disease:  - Outpatient surveillance.   SNF when able   Joseph Art, DO [facility] [email or web address] 05/31/2023, 12:27 PM

## 2023-05-31 NOTE — Progress Notes (Signed)
   05/31/23 1130  Orthostatic Lying   BP- Lying 117/70  Pulse- Lying 60  Orthostatic Sitting  BP- Sitting 107/57  Pulse- Sitting 64  Orthostatic Standing at 0 minutes  BP- Standing at 0 minutes (!) 74/61  Pulse- Standing at 0 minutes 73  Orthostatic Standing at 3 minutes  BP- Standing at 3 minutes 116/79  Pulse- Standing at 3 minutes 72    Standing 3 minutes BP after SCD placement and seated marches.  Marye Round, PT DPT Acute Rehabilitation Services Secure Chat Preferred  Office [phone]

## 2023-05-31 NOTE — Progress Notes (Signed)
Occupational Therapy Treatment Patient Details Name: Roy [name] MRN: [id number] DOB: 04-08-1951 Today's Date: 05/31/2023   History of present illness 73 yo male 1/17 s/p fall with unstable T11-T12 chance fx. Pt 1/20 internal fixation T11-T12 pedicule screw from T10-L1  PMH ankylosing spondylitis,adrenal insufficiency, CHB s/p PPM 09/2022, PAF on eliquis, HTN, HLD, stage IV DLBCL s/p R-CHOP, chronic neuropathy, L THA,   OT comments  Pt progressed from Bed to bariatric BSC and several steps to the chair. Pt required increased time and SCD for BP support during session. Pt motivated to get up and moving. Pt might benefit from Ted hose pressure support to keep the patient progressing and movement to aid with bowel constipation. Recommendation for Patient will benefit from intensive inpatient follow up therapy, >3 hours/day       If plan is discharge home, recommend the following:  Two people to help with walking and/or transfers;Two people to help with bathing/dressing/bathroom   Equipment Recommendations  Other (comment) (RW bariatric)    Recommendations for Other Services Rehab consult    Precautions / Restrictions Precautions Precautions: Fall;Back Precaution Comments: reviewed back precautions Restrictions Weight Bearing Restrictions Per Provider Order: No       Mobility Bed Mobility Overal bed mobility: Needs Assistance   Rolling: Mod assist   Supine to sit: Max assist          Transfers Overall transfer level: Needs assistance Equipment used: Rolling walker (2 wheels) Transfers: Sit to/from Stand Sit to Stand: Mod assist, +2 physical assistance, From elevated surface Stand pivot transfers: +2 physical assistance, Mod assist         General transfer comment: cues for side stepping     Balance Overall balance assessment: Needs assistance Sitting-balance support: Bilateral upper extremity supported, Feet supported Sitting balance-Leahy Scale: Poor      Standing balance support: Bilateral upper extremity supported, During functional activity, Reliant on assistive device for balance Standing balance-Leahy Scale: Poor                             ADL either performed or assessed with clinical judgement   ADL Overall ADL's : Needs assistance/impaired Eating/Feeding: Set up Eating/Feeding Details (indicate cue type and reason): drinking from cup                     Toilet Transfer: +2 for physical assistance;Moderate assistance Toilet Transfer Details (indicate cue type and reason): stand pivot R to [facility] bariatric Toileting- Clothing Manipulation and Hygiene: Total assistance         General ADL Comments: pt progressed from EOB to [facility] and BSC to chair. pt able to take steps toward chair. pt reports needing to void bowel. pt produces liquid only with passing air on BSC. RN in room to visualize and chart    Extremity/Trunk Assessment Upper Extremity Assessment Upper Extremity Assessment: Overall WFL for tasks assessed            Vision       Perception     Praxis      Cognition Arousal: Alert Behavior During Therapy: [facility] for tasks assessed/performed Overall Cognitive Status: Within Functional Limits for tasks assessed                                          Exercises  Shoulder Instructions       General Comments SPO2 85% on RA during transfer, placed on 2LO2 for recovery to 95%    Pertinent Vitals/ Pain       Pain Assessment Pain Assessment: 0-10 Pain Score: 5  Pain Location: back Pain Descriptors / Indicators: Operative site guarding, Grimacing, Guarding Pain Intervention(s): Monitored during session, Premedicated before session, Repositioned  Home Living                                          Prior Functioning/Environment              Frequency  Min 1X/week        Progress Toward Goals  OT Goals(current goals can now be found in  the care plan section)  Progress towards OT goals: Progressing toward goals  Acute Rehab OT Goals Patient Stated Goal: to get to go to the bathroom OT Goal Formulation: With patient Time For Goal Achievement: 06/11/23 Potential to Achieve Goals: Good ADL Goals Pt Will Perform Upper Body Bathing: with set-up;sitting Pt Will Transfer to Toilet: with mod assist;bedside commode;ambulating Additional ADL Goal #1: pt will complete bed mobility with rails min (A) Additional ADL Goal #2: pt will complete sit<>Stand with RW total +2 mod (A)  Plan      Co-evaluation    PT/OT/SLP Co-Evaluation/Treatment: Yes Reason for Co-Treatment: Complexity of the patient's impairments (multi-system involvement);Necessary to address cognition/behavior during functional activity;To address functional/ADL transfers;For patient/therapist safety PT goals addressed during session: Mobility/safety with mobility;Balance OT goals addressed during session: ADL's and self-care;Proper use of Adaptive equipment and DME;Strengthening/ROM      AM-PAC OT "6 Clicks" Daily Activity     Outcome Measure   Help from another person eating meals?: A Little Help from another person taking care of personal grooming?: A Little Help from another person toileting, which includes using toliet, bedpan, or urinal?: A Lot Help from another person bathing (including washing, rinsing, drying)?: A Lot Help from another person to put on and taking off regular upper body clothing?: A Little Help from another person to put on and taking off regular lower body clothing?: A Lot 6 Click Score: 15    End of Session Equipment Utilized During Treatment: Gait belt;Rolling walker (2 wheels)  OT Visit Diagnosis: Unsteadiness on feet (R26.81);Muscle weakness (generalized) (M62.81)   Activity Tolerance Patient tolerated treatment well   Patient Left in chair;with call bell/phone within reach;with chair alarm set   Nurse Communication Mobility  status;Precautions;Other (comment)        Time: 0865-7846 OT Time Calculation (min): 34 min  Charges: OT General Charges $OT Visit: 1 Visit OT Treatments $Self Care/Home Management : 8-22 mins   Brynn, OTR/L  Acute Rehabilitation Services Office: [phone] .   Mateo Flow 05/31/2023, 1:16 PM

## 2023-05-31 NOTE — Progress Notes (Signed)
Physical Therapy Treatment Patient Details Name: Roy [name] MRN: [id number] DOB: 1951-02-17 Today's Date: 05/31/2023   History of Present Illness 73 yo male 1/17 s/p fall with unstable T11-T12 chance fx. Pt 1/20 internal fixation T11-T12 pedicule screw from T10-L1  PMH ankylosing spondylitis,adrenal insufficiency, CHB s/p PPM 09/2022, PAF on eliquis, HTN, HLD, stage IV DLBCL s/p R-CHOP, chronic neuropathy, L THA,    PT Comments  Pt eager to get OOB, is pretty uncomfortable given pt still has not had a BM except for min liquid stool he had yesterday. Pt progressing to repeated transfers with RW and short-distance gait this date, overall pt requiring mod +2 assist to perform. Pt with limited standing tolerance, but anticipate good progress with post-acute rehab. Pt with symptomatic orthostatic hypotension when going from sitting to standing, improved by 3 minute-mark with assist of SCDs and seated rest (orthostatics listed in previous note; the last value in the standing at 3 minutes was actually in sitting given pt tolerance). Pt with min liquid stool again this date, passing gas, RN notified. PT to continue to follow.     If plan is discharge home, recommend the following: A lot of help with walking and/or transfers;A lot of help with bathing/dressing/bathroom   Can travel by private vehicle        Equipment Recommendations  Other (comment)    Recommendations for Other Services       Precautions / Restrictions Precautions Precautions: Fall;Back Precaution Comments: reviewed back precautions Restrictions Weight Bearing Restrictions Per Provider Order: No     Mobility  Bed Mobility Overal bed mobility: Needs Assistance   Rolling: Mod assist   Supine to sit: Max assist     General bed mobility comments: assist for roll to L for completion of truncal and LE translation, trunk elevation off of bed, and scooting anteriorly with bed pad assist.    Transfers Overall transfer  level: Needs assistance Equipment used: Rolling walker (2 wheels) Transfers: Sit to/from Stand Sit to Stand: Mod assist, +2 physical assistance, From elevated surface Stand pivot transfers: +2 physical assistance, Mod assist         General transfer comment: assist for power up, rise, steadying, and lateral stepping towards R to [facility]. Stand x2 from EOB and BSC.    Ambulation/Gait Ambulation/Gait assistance: Mod assist, +2 physical assistance Gait Distance (Feet): 5 Feet Assistive device: Rolling walker (2 wheels) Gait Pattern/deviations: Step-through pattern, Decreased stride length, Trunk flexed, Wide base of support Gait velocity: decr     General Gait Details: assist for steadying, managing RW, correcting L lateral bias   Stairs             Wheelchair Mobility     Tilt Bed    Modified Rankin (Stroke Patients Only)       Balance Overall balance assessment: Needs assistance Sitting-balance support: Feet supported, Bilateral upper extremity supported Sitting balance-Leahy Scale: Fair     Standing balance support: Bilateral upper extremity supported, During functional activity, Reliant on assistive device for balance Standing balance-Leahy Scale: Poor Standing balance comment: reliant on external support                            Cognition Arousal: Alert Behavior During Therapy: WFL for tasks assessed/performed Overall Cognitive Status: Within Functional Limits for tasks assessed  Exercises General Exercises - Lower Extremity Hip Flexion/Marching: AROM, Both, 5 reps, Seated    General Comments General comments (skin integrity, edema, etc.): SPO2 85% on RA during transfer, placed on 2LO2 for recovery to 95%      Pertinent Vitals/Pain Pain Assessment Pain Assessment: Faces Faces Pain Scale: Hurts little more Pain Location: back Pain Descriptors / Indicators: Operative site  guarding, Grimacing, Guarding Pain Intervention(s): Limited activity within patient's tolerance, Monitored during session, Repositioned    Home Living                          Prior Function            PT Goals (current goals can now be found in the care plan section) Acute Rehab PT Goals Patient Stated Goal: home PT Goal Formulation: With patient Time For Goal Achievement: 06/11/23 Potential to Achieve Goals: Fair Progress towards PT goals: Progressing toward goals    Frequency    Min 1X/week      PT Plan      Co-evaluation PT/OT/SLP Co-Evaluation/Treatment: Yes Reason for Co-Treatment: Complexity of the patient's impairments (multi-system involvement);Necessary to address cognition/behavior during functional activity;To address functional/ADL transfers;For patient/therapist safety PT goals addressed during session: Mobility/safety with mobility;Balance OT goals addressed during session: ADL's and self-care;Proper use of Adaptive equipment and DME;Strengthening/ROM      AM-PAC PT "6 Clicks" Mobility   Outcome Measure  Help needed turning from your back to your side while in a flat bed without using bedrails?: A Lot Help needed moving from lying on your back to sitting on the side of a flat bed without using bedrails?: A Lot Help needed moving to and from a bed to a chair (including a wheelchair)?: A Lot Help needed standing up from a chair using your arms (e.g., wheelchair or bedside chair)?: A Lot Help needed to walk in hospital room?: Total Help needed climbing 3-5 steps with a railing? : Total 6 Click Score: 10    End of Session Equipment Utilized During Treatment: Oxygen;Gait belt Activity Tolerance: Patient tolerated treatment well Patient left: in chair;with call bell/phone within reach;with chair alarm set Nurse Communication: Mobility status PT Visit Diagnosis: Other abnormalities of gait and mobility (R26.89);Muscle weakness (generalized)  (M62.81)     Time: 4098-1191 PT Time Calculation (min) (ACUTE ONLY): 34 min  Charges:    $Therapeutic Activity: 8-22 mins PT General Charges $$ ACUTE PT VISIT: 1 Visit                     Marye Round, PT DPT Acute Rehabilitation Services Secure Chat Preferred  Office [phone]    Tamia Dial E Stroup 05/31/2023, 1:00 PM

## 2023-06-01 DIAGNOSIS — S22089S Unspecified fracture of T11-T12 vertebra, sequela: Secondary | ICD-10-CM | POA: Diagnosis not present

## 2023-06-01 DIAGNOSIS — S22089A Unspecified fracture of T11-T12 vertebra, initial encounter for closed fracture: Secondary | ICD-10-CM

## 2023-06-01 DIAGNOSIS — J9601 Acute respiratory failure with hypoxia: Secondary | ICD-10-CM | POA: Diagnosis not present

## 2023-06-01 LAB — CBC
HCT: 44.4 % (ref 39.0–52.0)
Hemoglobin: 14.8 g/dL (ref 13.0–17.0)
MCH: 30.7 pg (ref 26.0–34.0)
MCHC: 33.3 g/dL (ref 30.0–36.0)
MCV: 92.1 fL (ref 80.0–100.0)
Platelets: 190 10*3/uL (ref 150–400)
RBC: 4.82 MIL/uL (ref 4.22–5.81)
RDW: 14.1 % (ref 11.5–15.5)
WBC: 12.1 10*3/uL — ABNORMAL HIGH (ref 4.0–10.5)
nRBC: 0.2 % (ref 0.0–0.2)

## 2023-06-01 LAB — BASIC METABOLIC PANEL
Anion gap: 10 (ref 5–15)
BUN: 8 mg/dL (ref 8–23)
CO2: 40 mmol/L — ABNORMAL HIGH (ref 22–32)
Calcium: 9.1 mg/dL (ref 8.9–10.3)
Chloride: 81 mmol/L — ABNORMAL LOW (ref 98–111)
Creatinine, Ser: 0.66 mg/dL (ref 0.61–1.24)
GFR, Estimated: >60 mL/min (ref >60)
Glucose, Bld: 122 mg/dL — ABNORMAL HIGH (ref 70–99)
Potassium: 2.5 mmol/L — CL (ref 3.5–5.1)
Sodium: 131 mmol/L — ABNORMAL LOW (ref 135–145)

## 2023-06-01 LAB — MAGNESIUM: Magnesium: 2.1 mg/dL (ref 1.7–2.4)

## 2023-06-01 MED ORDER — POTASSIUM CHLORIDE CRYS ER 20 MEQ PO TBCR
40.0000 meq | EXTENDED_RELEASE_TABLET | ORAL | Status: AC
  Administered 2023-06-01 – 2023-06-02 (×4): 40 meq via ORAL
  Filled 2023-06-01 (×4): qty 2

## 2023-06-01 NOTE — Progress Notes (Signed)
   06/01/23 1240  Provider Notification  Provider Name/Title Dr. Benjamine Mola DO  Date Provider Notified 06/01/23  Time Provider Notified 1238  Method of Notification Page  Notification Reason Critical Result  Test performed and critical result BMP w/ Potassium 2.5  Date Critical Result Received 06/01/23  Time Critical Result Received 1237  Provider response See new orders  Date of Provider Response 06/01/23  Time of Provider Response 1239

## 2023-06-01 NOTE — Progress Notes (Signed)
[facility] PROGRESS NOTE  Roy [name] (DOB: 07-May-1951) [id number]:[id number] PCP: Rebecka Apley, NP  Brief Narrative: Roy [name] is a 73 y.o. male with hx of primary adrenal insufficiency on chronic glucocorticoid/mineralocorticoid, ankylosing spondylitis, CHB s/p PPM May 2024, PAF on eliquis, HTN, HLD, stage IV DLBCL s/p R-CHOP with no residual disease on last imaging who presented to the ED 1/17 with back pain after a fall. Attempting to enter his truck 1/16, he slipped off the step and fell backwards onto the ground.  At the time he had what he describes as muscle spasms on both flanks but no pain in his mid back.  He was able to get up and walk after the fall.  The following day, he sat up and suddenly heard a pop and had severe midline lower back pain which did not radiate.  He has chronic neuropathy in both legs but says this sensation is worse on the left and has hypoesthesia up to the knee on the left, hypoesthesia to the ankle on the right.  Other than his chronic neuropathy symptoms he has no new numbness.  No weakness or incontinence.   Imaging in the ED was concerning for unstable T11 fracture for which neurosurgery recommended transfer to [facility] for surgical management, strict spinal precautions, and allowing eliquis washout. 1/20, patient underwent posterior spinal fixation Dr. Danielle Dess.  Subjective:  Had some BMs    Objective: BP 123/64 (BP Location: Left Arm)   Pulse 65   Temp 98.2 F (36.8 C) (Oral)   Resp 20   Ht 6' (1.829 m)   Wt 135.2 kg   SpO2 92%   BMI 40.42 kg/m     General: Appearance:    Severely obese male in no acute distress   Obese abd- softer  Lungs:     On [place] respirations unlabored  Heart:    Normal heart rate.     MS:   All extremities are intact.   Neurologic:   Awake, alert     Assessment & Plan:  Unstable T11-12 Chance fracture, 2 mm anterolisthesis, additional fracture posterior elements of T10-11:  -Admitted from [facility]  Last dose of Eliquis 1/17 AM.   -Underwent posterior spinal fixation by neurosurgery 1/20.   -Adequate pain management, PT OT  Weightbearing as tolerated.  Referred to SNF -Started on calcium and vitamin D supplementation due to his chronic glucocorticoid use.   Acute hypoxic respiratory failure: CT noting pleural effusion and bibasilar atelectasis, chest x-ray with note of low volumes and left basilar atelectasis.  Suspect likely due to hypoventilation secondary to pain from his vertebral fracture. Suspect hypoventilation due to pain, immobility. Viral swab negative.  - Incentive spirometry is stressed.  - Continue supplemental oxygen as needed to maintain normal WOB and SpO2 >89%.   -O2 low while sleeping as I suspect sleep apnea-- will need outpatient follow up/testing  Ankylosing spondylitis by imaging:  - He will need to have a referral to rheumatology to evaluate for therapy for ankylosing spondylitis.  Likely would benefit from PFTs to evaluate for restrictive lung disease as well.     Primary adrenal insufficiency:  - Continue home prednisone 10 mg, fludrocortisone 0.1 mg daily. BP and metabolic profiles are unremarkable.   Vitamin D deficiency: Level is 20.35.  - Start supplementation w/50k units weekly  Leukocytosis: Afebrile, suspected to be reactive to fractures.  Stable.  CHB status post PPM, PAF:  -eliquis -not on rate control agent  HTN:  -  monitor orthostatics   HLD:  - Continue rosuvastatin  Peripheral neuropathy:  - Continue home lyrica  BPH:  - Continue home alfuzosin  Hypothyroidism: Recent TSH wnl. - Continue synthroid  Diffuse large B-cell lymphoma status post R-CHOP, no residual disease:  - Outpatient surveillance.   SNF when able   Joseph Art, DO [facility] [email or web address] 06/01/2023, 9:08 AM

## 2023-06-01 NOTE — Progress Notes (Signed)
Occupational Therapy Treatment Patient Details Name: Roy [name] MRN: [id number] DOB: [birth year] Today's Date: 06/01/2023   History of present illness 73 yo male 1/17 s/p fall with unstable T11-T12 chance fx. Pt 1/20 internal fixation T11-T12 pedicule screw from T10-L1  PMH ankylosing spondylitis,adrenal insufficiency, CHB s/p PPM 09/2022, PAF on eliquis, HTN, HLD, stage IV DLBCL s/p R-CHOP, chronic neuropathy, L THA,   OT comments  Patient up in wheelchair upon entry and asking to return to bed due to increased back pain. Patient required max assist +2 for sit to stand from recliner and mod assist +2 for transfer to EOB. Patient able to continue to stand while at bedside to allow for cleaning bottom due to loose stool in chair. Patient instructed in log rolling to return to supine. Patient performed grooming and BUE strengthening at bed level with patient stating increased pain relief. Patient will benefit from intensive inpatient follow up therapy, >3 hours/day. Acute OT to continue to follow to address established goals to facilitate DC to next venue of care.       If plan is discharge home, recommend the following:  Two people to help with walking and/or transfers;Two people to help with bathing/dressing/bathroom   Equipment Recommendations  Other (comment) (bariatric RW)    Recommendations for Other Services Rehab consult    Precautions / Restrictions Precautions Precautions: Fall;Back Precaution Comments: reviewed back precautions Restrictions Weight Bearing Restrictions Per Provider Order: No       Mobility Bed Mobility Overal bed mobility: Needs Assistance Bed Mobility: Sit to Sidelying, Rolling Rolling: Mod assist       Sit to sidelying: Max assist, +2 for physical assistance General bed mobility comments: patient required assistance with trunk and BLE to go to side lying and able to roll with mod assist to straighten bed pads    Transfers Overall transfer level:  Needs assistance Equipment used: Rolling walker (2 wheels) Transfers: Sit to/from Stand, Bed to chair/wheelchair/BSC Sit to Stand: Max assist, +2 physical assistance     Step pivot transfers: Mod assist, +2 physical assistance     General transfer comment: max assist +2 to stand from recliner to RW and mod assist +2 to step pivot transfer to EOB     Balance Overall balance assessment: Needs assistance Sitting-balance support: Feet supported, Bilateral upper extremity supported Sitting balance-Leahy Scale: Fair Sitting balance - Comments: EOB   Standing balance support: Bilateral upper extremity supported, During functional activity, Reliant on assistive device for balance Standing balance-Leahy Scale: Poor Standing balance comment: reliant on RW for support                           ADL either performed or assessed with clinical judgement   ADL Overall ADL's : Needs assistance/impaired     Grooming: Wash/dry hands;Wash/dry face;Oral care;Minimal assistance;Bed level       Lower Body Bathing: Maximal assistance;Sit to/from stand Lower Body Bathing Details (indicate cue type and reason): stood at EOB to clean bottom due to loose stool in chair                       General ADL Comments: Patient up in wheelchair upon entry, asking to return to bed due to back pain, once in bed patient stated pain began to fade to 2/10    Extremity/Trunk Assessment              Vision  Perception     Praxis      Cognition Arousal: Alert Behavior During Therapy: WFL for tasks assessed/performed Overall Cognitive Status: Within Functional Limits for tasks assessed                                          Exercises Exercises: General Upper Extremity General Exercises - Upper Extremity Shoulder Flexion: AROM, Both, 10 reps, Supine Shoulder Horizontal ABduction: Strengthening, Both, 10 reps, Supine, Theraband Theraband Level (Shoulder  Horizontal Abduction): Level 2 (Red) Elbow Flexion: Strengthening, Both, 10 reps, Supine, Theraband Theraband Level (Elbow Flexion): Level 2 (Red) Elbow Extension: Strengthening, Both, 10 reps, Supine, Theraband Theraband Level (Elbow Extension): Level 2 (Red)    Shoulder Instructions       General Comments BP 112/53 (55) SpO2 85-90% on RA    Pertinent Vitals/ Pain       Pain Assessment Pain Assessment: 0-10 Pain Score: 7  Pain Location: back, headache Pain Descriptors / Indicators: Operative site guarding, Grimacing, Guarding, Headache Pain Intervention(s): Limited activity within patient's tolerance, Monitored during session, Repositioned, RN gave pain meds during session  Home Living                                          Prior Functioning/Environment              Frequency  Min 1X/week        Progress Toward Goals  OT Goals(current goals can now be found in the care plan section)  Progress towards OT goals: Progressing toward goals  Acute Rehab OT Goals Patient Stated Goal: go back to bed OT Goal Formulation: With patient Time For Goal Achievement: 06/11/23 Potential to Achieve Goals: Good ADL Goals Pt Will Perform Upper Body Bathing: with set-up;sitting Pt Will Transfer to Toilet: with mod assist;bedside commode;ambulating Additional ADL Goal #1: pt will complete bed mobility with rails min (A) Additional ADL Goal #2: pt will complete sit<>Stand with RW total +2 mod (A)  Plan      Co-evaluation                 AM-PAC OT "6 Clicks" Daily Activity     Outcome Measure   Help from another person eating meals?: A Little Help from another person taking care of personal grooming?: A Little Help from another person toileting, which includes using toliet, bedpan, or urinal?: A Lot Help from another person bathing (including washing, rinsing, drying)?: A Lot Help from another person to put on and taking off regular upper body  clothing?: A Little Help from another person to put on and taking off regular lower body clothing?: A Lot 6 Click Score: 15    End of Session Equipment Utilized During Treatment: Gait belt;Rolling walker (2 wheels)  OT Visit Diagnosis: Unsteadiness on feet (R26.81);Muscle weakness (generalized) (M62.81)   Activity Tolerance Patient tolerated treatment well;Patient limited by pain   Patient Left in bed;with bed alarm set;with call bell/phone within reach   Nurse Communication Mobility status;Precautions        Time: 0865-7846 OT Time Calculation (min): 24 min  Charges: OT General Charges $OT Visit: 1 Visit OT Treatments $Self Care/Home Management : 8-22 mins $Therapeutic Exercise: 8-22 mins  Alfonse Flavors, OTA Acute Rehabilitation Services  Office [phone]   Bea Duren L  Cornelius Moras 06/01/2023, 2:22 PM

## 2023-06-02 ENCOUNTER — Inpatient Hospital Stay (HOSPITAL_COMMUNITY): Payer: 59

## 2023-06-02 DIAGNOSIS — K5900 Constipation, unspecified: Secondary | ICD-10-CM

## 2023-06-02 DIAGNOSIS — S22082A Unstable burst fracture of T11-T12 vertebra, initial encounter for closed fracture: Secondary | ICD-10-CM | POA: Diagnosis not present

## 2023-06-02 LAB — BASIC METABOLIC PANEL
Anion gap: 11 (ref 5–15)
BUN: 8 mg/dL (ref 8–23)
CO2: 30 mmol/L (ref 22–32)
Calcium: 8.8 mg/dL — ABNORMAL LOW (ref 8.9–10.3)
Chloride: 89 mmol/L — ABNORMAL LOW (ref 98–111)
Creatinine, Ser: 0.68 mg/dL (ref 0.61–1.24)
GFR, Estimated: >60 mL/min (ref >60)
Glucose, Bld: 128 mg/dL — ABNORMAL HIGH (ref 70–99)
Potassium: 3.9 mmol/L (ref 3.5–5.1)
Sodium: 130 mmol/L — ABNORMAL LOW (ref 135–145)

## 2023-06-02 LAB — CBC
HCT: 41.9 % (ref 39.0–52.0)
Hemoglobin: 14.6 g/dL (ref 13.0–17.0)
MCH: 31.5 pg (ref 26.0–34.0)
MCHC: 34.8 g/dL (ref 30.0–36.0)
MCV: 90.3 fL (ref 80.0–100.0)
Platelets: 225 10*3/uL (ref 150–400)
RBC: 4.64 MIL/uL (ref 4.22–5.81)
RDW: 14.5 % (ref 11.5–15.5)
WBC: 12.6 10*3/uL — ABNORMAL HIGH (ref 4.0–10.5)
nRBC: 0.2 % (ref 0.0–0.2)

## 2023-06-02 MED ORDER — MAGNESIUM CITRATE PO SOLN
1.0000 | Freq: Once | ORAL | Status: AC
  Administered 2023-06-02: 1 via ORAL
  Filled 2023-06-02: qty 296

## 2023-06-02 MED ORDER — POTASSIUM CHLORIDE CRYS ER 20 MEQ PO TBCR
20.0000 meq | EXTENDED_RELEASE_TABLET | Freq: Once | ORAL | Status: AC
  Administered 2023-06-02: 20 meq via ORAL
  Filled 2023-06-02: qty 1

## 2023-06-02 NOTE — Progress Notes (Signed)
Pt noted to have a skin tear to his right forearm; pt report pumping his arm on the chair while sitting. Wound cleansed, xeroform and foam dsg applied. Dionne Bucy RN

## 2023-06-02 NOTE — Progress Notes (Signed)
[facility] PROGRESS NOTE  Roy [name] (DOB: 09-11-1950) [id number] PCP: Rebecka Apley, NP  Brief Narrative: Roy [name] is a 73 y.o. male with hx of primary adrenal insufficiency on chronic glucocorticoid/mineralocorticoid, ankylosing spondylitis, CHB s/p PPM May 2024, PAF on eliquis, HTN, HLD, stage IV DLBCL s/p R-CHOP with no residual disease on last imaging who presented to the ED 1/17 with back pain after a fall. Attempting to enter his truck 1/16, he slipped off the step and fell backwards onto the ground.  At the time he had what he describes as muscle spasms on both flanks but no pain in his mid back.  He was able to get up and walk after the fall.  The following day, he sat up and suddenly heard a pop and had severe midline lower back pain which did not radiate.  He has chronic neuropathy in both legs but says this sensation is worse on the left and has hypoesthesia up to the knee on the left, hypoesthesia to the ankle on the right.  Other than his chronic neuropathy symptoms he has no new numbness.  No weakness or incontinence.   Imaging in the ED was concerning for unstable T11 fracture for which neurosurgery recommended transfer to [facility] for surgical management, strict spinal precautions, and allowing eliquis washout. 1/20, patient underwent posterior spinal fixation Dr. Danielle Dess.  Subjective:  Still having BM but feels constipated    Objective: BP 107/65 (BP Location: Left Arm)   Pulse 60   Temp 97.8 F (36.6 C) (Oral)   Resp (!) 22   Ht 6' (1.829 m)   Wt 135.2 kg   SpO2 91%   BMI 40.42 kg/m     General: Appearance:    Severely obese male in no acute distress   Obese abd- softer  Lungs:     On [place] respirations unlabored  Heart:    Normal heart rate.     MS:   All extremities are intact.   Neurologic:   Awake, alert     Assessment & Plan:  Unstable T11-12 Chance fracture, 2 mm anterolisthesis, additional fracture posterior elements of T10-11:   -Admitted from [facility].  Last dose of Eliquis 1/17 AM.   -Underwent posterior spinal fixation by neurosurgery 1/20.   -Adequate pain management, PT OT  Weightbearing as tolerated.  Referred to SNF -Started on calcium and vitamin D supplementation due to his chronic glucocorticoid use.   Acute hypoxic respiratory failure: CT noting pleural effusion and bibasilar atelectasis, chest x-ray with note of low volumes and left basilar atelectasis.  Suspect likely due to hypoventilation secondary to pain from his vertebral fracture. Suspect hypoventilation due to pain, immobility. Viral swab negative.  - Incentive spirometry is stressed.  - Continue supplemental oxygen as needed to maintain normal WOB and SpO2 >89%.   -O2 low while sleeping as I suspect sleep apnea-- will need outpatient follow up/testing  Ankylosing spondylitis by imaging:  - He will need to have a referral to rheumatology to evaluate for therapy for ankylosing spondylitis.  Likely would benefit from PFTs to evaluate for restrictive lung disease as well.     Hypokalemia Re-plete -labs pending today  Primary adrenal insufficiency:  - Continue home prednisone 10 mg, fludrocortisone 0.1 mg daily. BP and metabolic profiles are unremarkable.   Constipation -bowel regimen -dg x ray pending  Vitamin D deficiency: Level is 20.35.  - Start supplementation w/50k units weekly  Leukocytosis: Afebrile, suspected to be reactive to fractures.  Stable.  CHB status post PPM, PAF:  -eliquis -not on rate control agent  HTN:  -monitor orthostatics   HLD:  - Continue rosuvastatin  Peripheral neuropathy:  - Continue home lyrica  BPH:  - Continue home alfuzosin  Hypothyroidism: Recent TSH wnl. - Continue synthroid  Diffuse large B-cell lymphoma status post R-CHOP, no residual disease:  - Outpatient surveillance.   SNF when able   Joseph Art, DO [facility] [email or web address] 06/02/2023, 12:05 PM

## 2023-06-03 DIAGNOSIS — K567 Ileus, unspecified: Secondary | ICD-10-CM | POA: Diagnosis not present

## 2023-06-03 LAB — CBC
HCT: 42 % (ref 39.0–52.0)
Hemoglobin: 14.3 g/dL (ref 13.0–17.0)
MCH: 31.2 pg (ref 26.0–34.0)
MCHC: 34 g/dL (ref 30.0–36.0)
MCV: 91.5 fL (ref 80.0–100.0)
Platelets: 231 10*3/uL (ref 150–400)
RBC: 4.59 MIL/uL (ref 4.22–5.81)
RDW: 14.5 % (ref 11.5–15.5)
WBC: 13.9 10*3/uL — ABNORMAL HIGH (ref 4.0–10.5)
nRBC: 0.1 % (ref 0.0–0.2)

## 2023-06-03 LAB — BASIC METABOLIC PANEL
Anion gap: 12 (ref 5–15)
BUN: 8 mg/dL (ref 8–23)
CO2: 28 mmol/L (ref 22–32)
Calcium: 8.8 mg/dL — ABNORMAL LOW (ref 8.9–10.3)
Chloride: 90 mmol/L — ABNORMAL LOW (ref 98–111)
Creatinine, Ser: 0.58 mg/dL — ABNORMAL LOW (ref 0.61–1.24)
GFR, Estimated: >60 mL/min (ref >60)
Glucose, Bld: 81 mg/dL (ref 70–99)
Potassium: 3.7 mmol/L (ref 3.5–5.1)
Sodium: 130 mmol/L — ABNORMAL LOW (ref 135–145)

## 2023-06-03 MED ORDER — POTASSIUM CHLORIDE CRYS ER 20 MEQ PO TBCR
40.0000 meq | EXTENDED_RELEASE_TABLET | Freq: Once | ORAL | Status: AC
Start: 2023-06-03 — End: 2023-06-03
  Administered 2023-06-03: 40 meq via ORAL
  Filled 2023-06-03: qty 2

## 2023-06-03 NOTE — Progress Notes (Addendum)
[facility] PROGRESS NOTE  Roy [name] (DOB: 1951-05-05) ZDG:[id number] PCP: Rebecka Apley, NP   Brief Narrative: Roy [name] is a 73 y.o. male with hx of primary adrenal insufficiency on chronic glucocorticoid/mineralocorticoid, ankylosing spondylitis, CHB s/p PPM May 2024, PAF on eliquis, HTN, HLD, stage IV DLBCL s/p R-CHOP with no residual disease on last imaging who presented to the ED 1/17 with back pain after a fall. Attempting to enter his truck 1/16, he slipped off the step and fell backwards onto the ground.  At the time he had what he describes as muscle spasms on both flanks but no pain in his mid back.  He was able to get up and walk after the fall.  The following day, he sat up and suddenly heard a pop and had severe midline lower back pain which did not radiate.  He has chronic neuropathy in both legs but says this sensation is worse on the left and has hypoesthesia up to the knee on the left, hypoesthesia to the ankle on the right.  Other than his chronic neuropathy symptoms he has no new numbness.  No weakness or incontinence.   Imaging in the ED was concerning for unstable T11 fracture for which neurosurgery recommended transfer to [facility] for surgical management, strict spinal precautions, and allowing eliquis washout. 1/20, patient underwent posterior spinal fixation Dr. Danielle Dess.  Stay has been complicated by constipation/ileus.  Subjective:  Having BMs but x ray showed ileus--- would like to get up and move No nausea    Objective: BP 109/79 (BP Location: Left Arm)   Pulse 60   Temp 97.8 F (36.6 C) (Oral)   Resp (!) 25   Ht 6' (1.829 m)   Wt 135.2 kg   SpO2 93%   BMI 40.42 kg/m      General: Appearance:    Severely obese male in no acute distress     Lungs:      respirations unlabored  Heart:    Normal heart rate.     MS:   All extremities are intact.   Neurologic:   Awake, alert- mildly confused     Assessment & Plan:  Unstable T11-12  Chance fracture, 2 mm anterolisthesis, additional fracture posterior elements of T10-11:  -Admitted from [facility].  Last dose of Eliquis 1/17 AM.   -Underwent posterior spinal fixation by neurosurgery 1/20.   -Adequate pain management, PT OT  Weightbearing as tolerated.  Referred to SNF -Started on calcium and vitamin D supplementation due to his chronic glucocorticoid use.   Acute hypoxic respiratory failure: CT noting pleural effusion and bibasilar atelectasis, chest x-ray with note of low volumes and left basilar atelectasis.  Suspect likely due to hypoventilation secondary to pain from his vertebral fracture. Suspect hypoventilation due to pain, immobility. Viral swab negative.  - Incentive spirometry is stressed.  - Continue supplemental oxygen as needed to maintain normal WOB and SpO2 >89%.   -O2 low while sleeping as I suspect sleep apnea-- will need outpatient follow up/testing  Ankylosing spondylitis by imaging:  - He will need to have a referral to rheumatology to evaluate for therapy for ankylosing spondylitis.  Likely would benefit from PFTs to evaluate for restrictive lung disease as well.     Hypokalemia Replete  Primary adrenal insufficiency:  - Continue home prednisone 10 mg, fludrocortisone 0.1 mg daily. BP and metabolic profiles are unremarkable.   Constipation/mild ileus -bowel regimen -liquid diet for now -ambulate/OOB to chair  Vitamin D deficiency:  Level is 20.35.  - Start supplementation w/50k units weekly  Leukocytosis: Afebrile, suspected to be reactive to fractures.  Stable.  CHB status post PPM, PAF:  -eliquis -not on rate control agent  HTN:  -monitor orthostatics   HLD:  - Continue rosuvastatin  Peripheral neuropathy:  - Continue home lyrica  BPH:  - Continue home alfuzosin  Hypothyroidism: Recent TSH wnl. - Continue synthroid  Diffuse large B-cell lymphoma status post R-CHOP, no residual disease:  - Outpatient  surveillance.   SNF when able   Joseph Art, DO [facility] [email or web address] 06/03/2023, 11:54 AM

## 2023-06-03 NOTE — Progress Notes (Signed)
Physical Therapy Treatment Patient Details Name: Roy [name] MRN: [id number] DOB: 1951-01-09 Today's Date: 06/03/2023   History of Present Illness 73 yo male 1/17 s/p fall with unstable T11-T12 chance fx. Pt 1/20 internal fixation T11-T12 pedicule screw from T10-L1  PMH ankylosing spondylitis,adrenal insufficiency, CHB s/p PPM 09/2022, PAF on eliquis, HTN, HLD, stage IV DLBCL s/p R-CHOP, chronic neuropathy, L THA,    PT Comments  Pt with slower processing and increased time to follow commands this date, pt states he did not sleep well. Pt progressing to 20 ft gait in room with mod PT assist and RW, pt with increasing knee and truncal flexion with fatigue. Pt plan remains appropriate, PT to continue to follow.      If plan is discharge home, recommend the following: A lot of help with walking and/or transfers;A lot of help with bathing/dressing/bathroom   Can travel by private vehicle        Equipment Recommendations  Other (comment) (defer)    Recommendations for Other Services       Precautions / Restrictions Precautions Precautions: Fall;Back Precaution Comments: flexiseal Restrictions Weight Bearing Restrictions Per Provider Order: No     Mobility  Bed Mobility Overal bed mobility: Needs Assistance Bed Mobility: Rolling, Sidelying to Sit Rolling: Mod assist Sidelying to sit: Max assist, HOB elevated       General bed mobility comments: assist for trunk and LE management, pt able to scoot self to EOB    Transfers Overall transfer level: Needs assistance Equipment used: Rolling walker (2 wheels) Transfers: Sit to/from Stand Sit to Stand: Mod assist           General transfer comment: assist for rise and steady, cues for hand placement. stand x2, from EOB and chair    Ambulation/Gait Ambulation/Gait assistance: Mod assist Gait Distance (Feet): 20 Feet Assistive device: Rolling walker (2 wheels) Gait Pattern/deviations: Step-through pattern, Decreased  stride length, Trunk flexed, Wide base of support Gait velocity: decr     General Gait Details: assist for steadying, managing RW, bodyweight support towards end of gait with PT assisting pt's hips into recliner   Stairs             Wheelchair Mobility     Tilt Bed    Modified Rankin (Stroke Patients Only)       Balance Overall balance assessment: Needs assistance Sitting-balance support: Feet supported, Bilateral upper extremity supported Sitting balance-Leahy Scale: Fair Sitting balance - Comments: EOB   Standing balance support: Bilateral upper extremity supported, During functional activity, Reliant on assistive device for balance Standing balance-Leahy Scale: Poor Standing balance comment: reliant on RW for support                            Cognition Arousal: Alert Behavior During Therapy: WFL for tasks assessed/performed Overall Cognitive Status: Impaired/Different from baseline Area of Impairment: Problem solving, Following commands, Safety/judgement                       Following Commands: Follows one step commands with increased time Safety/Judgement: Decreased awareness of safety, Decreased awareness of deficits   Problem Solving: Slow processing, Decreased initiation, Difficulty sequencing, Requires verbal cues, Requires tactile cues          Exercises General Exercises - Lower Extremity Long Arc Quad: AROM, Both, 10 reps, Seated    General Comments General comments (skin integrity, edema, etc.): dressing on back intact at this  time, 3L [place] and noted to have descreased saturations but poor wave form. pt visually appears 3 out 4 DOE.      Pertinent Vitals/Pain Pain Assessment Pain Assessment: Faces Faces Pain Scale: Hurts even more Pain Location: back, headache Pain Descriptors / Indicators: Operative site guarding, Grimacing, Guarding, Headache Pain Intervention(s): Limited activity within patient's tolerance, Monitored  during session, Repositioned    Home Living                          Prior Function            PT Goals (current goals can now be found in the care plan section) Acute Rehab PT Goals Patient Stated Goal: home PT Goal Formulation: With patient Time For Goal Achievement: 06/11/23 Potential to Achieve Goals: Fair Progress towards PT goals: Progressing toward goals    Frequency    Min 1X/week      PT Plan      Co-evaluation              AM-PAC PT "6 Clicks" Mobility   Outcome Measure  Help needed turning from your back to your side while in a flat bed without using bedrails?: A Lot Help needed moving from lying on your back to sitting on the side of a flat bed without using bedrails?: A Lot Help needed moving to and from a bed to a chair (including a wheelchair)?: A Lot Help needed standing up from a chair using your arms (e.g., wheelchair or bedside chair)?: A Lot Help needed to walk in hospital room?: Total Help needed climbing 3-5 steps with a railing? : Total 6 Click Score: 10    End of Session Equipment Utilized During Treatment: Gait belt Activity Tolerance: Patient tolerated treatment well;Patient limited by fatigue Patient left: in chair;with call bell/phone within reach;with chair alarm set Nurse Communication: Mobility status PT Visit Diagnosis: Other abnormalities of gait and mobility (R26.89);Muscle weakness (generalized) (M62.81)     Time: 4098-1191 PT Time Calculation (min) (ACUTE ONLY): 21 min  Charges:    $Therapeutic Activity: 8-22 mins PT General Charges $$ ACUTE PT VISIT: 1 Visit                     Marye Round, PT DPT Acute Rehabilitation Services Secure Chat Preferred  Office [phone]    [name] E Stroup 06/03/2023, 1:15 PM

## 2023-06-03 NOTE — Progress Notes (Signed)
Notified by CCMD that pt had 6 beats of Vtach. MD notified.

## 2023-06-03 NOTE — Plan of Care (Signed)

## 2023-06-03 NOTE — Progress Notes (Signed)
Occupational Therapy Treatment Patient Details Name: Roy [name] MRN: [id number] DOB: November 05, 1950 Today's Date: 06/03/2023   History of present illness 73 yo male 1/17 s/p fall with unstable T11-T12 chance fx. Pt 1/20 internal fixation T11-T12 pedicule screw from T10-L1  PMH ankylosing spondylitis,adrenal insufficiency, CHB s/p PPM 09/2022, PAF on eliquis, HTN, HLD, stage IV DLBCL s/p R-CHOP, chronic neuropathy, L THA,   OT comments  Pt noted to have cognitive deficits this session with slower processing. Pt requires two staff members to achieve sit<>stand with cognitive deficits for sequence being a factor. Pt returned to supine after sitting in chair and placed in side lying for pressure relief. Recommendation for Patient will benefit from intensive inpatient follow up therapy, >3 hours/day       If plan is discharge home, recommend the following:  Two people to help with walking and/or transfers;Two people to help with bathing/dressing/bathroom   Equipment Recommendations  BSC/3in1;Hospital bed;Wheelchair cushion (measurements OT);Wheelchair (measurements OT);Other (comment) (RW- all dme bariatric)    Recommendations for Other Services Rehab consult    Precautions / Restrictions Precautions Precautions: Fall;Back Precaution Comments: flexiseal Restrictions Weight Bearing Restrictions Per Provider Order: No       Mobility Bed Mobility Overal bed mobility: Needs Assistance Bed Mobility: Sit to Supine Rolling: +2 for physical assistance, Max assist     Sit to supine: +2 for physical assistance, Max assist, Used rails   General bed mobility comments: pt requires max cues to sequence. max (A) to lift bil le onto bed surface. pt positioned in R side lying with pillows. pt reports feeling fatigued. pt currently with flexiseal and some drainage with movement noted.    Transfers Overall transfer level: Needs assistance Equipment used: Rolling walker (2 wheels) Transfers: Sit  to/from Stand, Bed to chair/wheelchair/BSC Sit to Stand: +2 safety/equipment, +2 physical assistance, Max assist Stand pivot transfers: +2 physical assistance, Max assist         General transfer comment: pt requires (A) to power up from surface. attempted x3 times during session to sequence and complete sit<>stand from chair. pt was unable to achieve it. pt with cognitive deficits for sequence and hand placement playing a role in ability to complete task. pt in standing with blank stare.     Balance Overall balance assessment: Needs assistance Sitting-balance support: Bilateral upper extremity supported, Feet supported Sitting balance-Leahy Scale: Poor     Standing balance support: Bilateral upper extremity supported, During functional activity, Reliant on assistive device for balance Standing balance-Leahy Scale: Poor Standing balance comment: pt requires two people with close chair follow to prevent falls                           ADL either performed or assessed with clinical judgement   ADL Overall ADL's : Needs assistance/impaired Eating/Feeding: Set up;Sitting Eating/Feeding Details (indicate cue type and reason): drinking from cup Grooming: Oral care;Wash/dry face;Minimal assistance;Sitting Grooming Details (indicate cue type and reason): needs cues to continue sequence. pt with partial top denture, nothing on the bottom. pt with decreased fine motor to hold tooth paste and lid.                               General ADL Comments: pt completed sit<>stand from chair with poor power up and requires second person. pt with x3 steps and then suddenly starts to sit without verbalizations. pt needed hand over hand  to sequence hand placement for RW and chair arm rest throughout all transfer. pt with sudden LOB x2 during session in sitting once in chair and once at [facility]    Extremity/Trunk Assessment Upper Extremity Assessment Upper Extremity Assessment:  Generalized weakness   Lower Extremity Assessment Lower Extremity Assessment: Defer to PT evaluation        Vision       Perception     Praxis      Cognition Arousal: Alert Behavior During Therapy: Flat affect Overall Cognitive Status: Impaired/Different from baseline Area of Impairment: Memory, Attention, Following commands                   Current Attention Level: Sustained Memory: Decreased recall of precautions, Decreased short-term memory Following Commands: Follows multi-step commands inconsistently, Follows multi-step commands with increased time       General Comments: pt when sitting edge of chair states "i am falling" and truck goes posterior in the chair and buttock slides foward. pt was static sitting when he suddenly reports he is falling in a seated position. pt without self correction sitting at eob and posterior lean. Rn made aware that cognition is not the same as prior sessions with this OT        Exercises      Shoulder Instructions       General Comments dressing on back intact at this time, 3L [place] and noted to have descreased saturations but poor wave form. pt visually appears 3 out 4 DOE.    Pertinent Vitals/ Pain       Pain Assessment Pain Assessment: Faces Faces Pain Scale: Hurts even more Pain Location: back, headache Pain Descriptors / Indicators: Operative site guarding, Grimacing, Guarding, Headache Pain Intervention(s): Limited activity within patient's tolerance, Monitored during session, Premedicated before session, Repositioned  Home Living                                          Prior Functioning/Environment              Frequency  Min 1X/week        Progress Toward Goals  OT Goals(current goals can now be found in the care plan section)  Progress towards OT goals: Progressing toward goals  Acute Rehab OT Goals Patient Stated Goal: to get some rest OT Goal Formulation: With patient Time  For Goal Achievement: 06/11/23 Potential to Achieve Goals: Good ADL Goals Pt Will Perform Upper Body Bathing: with set-up;sitting Pt Will Transfer to Toilet: with mod assist;bedside commode;ambulating Additional ADL Goal #1: pt will complete bed mobility with rails min (A) Additional ADL Goal #2: pt will complete sit<>Stand with RW total +2 mod (A)  Plan      Co-evaluation                 AM-PAC OT "6 Clicks" Daily Activity     Outcome Measure   Help from another person eating meals?: A Little Help from another person taking care of personal grooming?: A Little Help from another person toileting, which includes using toliet, bedpan, or urinal?: A Lot Help from another person bathing (including washing, rinsing, drying)?: A Lot Help from another person to put on and taking off regular upper body clothing?: A Lot Help from another person to put on and taking off regular lower body clothing?: A Lot 6 Click Score: 14  End of Session Equipment Utilized During Treatment: Gait belt;Rolling walker (2 wheels);Oxygen  OT Visit Diagnosis: Unsteadiness on feet (R26.81);Muscle weakness (generalized) (M62.81)   Activity Tolerance Patient tolerated treatment well   Patient Left in bed;with bed alarm set;with call bell/phone within reach   Nurse Communication Mobility status;Precautions        Time: 1610-9604 OT Time Calculation (min): 24 min  Charges: OT General Charges $OT Visit: 1 Visit OT Treatments $Self Care/Home Management : 23-37 mins   Brynn, OTR/L  Acute Rehabilitation Services Office: [phone] .   Mateo Flow 06/03/2023, 1:11 PM

## 2023-06-04 ENCOUNTER — Inpatient Hospital Stay (HOSPITAL_COMMUNITY): Payer: 59

## 2023-06-04 DIAGNOSIS — S22089S Unspecified fracture of T11-T12 vertebra, sequela: Secondary | ICD-10-CM | POA: Diagnosis not present

## 2023-06-04 DIAGNOSIS — D72829 Elevated white blood cell count, unspecified: Secondary | ICD-10-CM

## 2023-06-04 DIAGNOSIS — E785 Hyperlipidemia, unspecified: Secondary | ICD-10-CM

## 2023-06-04 DIAGNOSIS — E271 Primary adrenocortical insufficiency: Secondary | ICD-10-CM

## 2023-06-04 DIAGNOSIS — E559 Vitamin D deficiency, unspecified: Secondary | ICD-10-CM

## 2023-06-04 DIAGNOSIS — M4314 Spondylolisthesis, thoracic region: Secondary | ICD-10-CM

## 2023-06-04 DIAGNOSIS — E039 Hypothyroidism, unspecified: Secondary | ICD-10-CM

## 2023-06-04 DIAGNOSIS — M459 Ankylosing spondylitis of unspecified sites in spine: Secondary | ICD-10-CM

## 2023-06-04 DIAGNOSIS — K5900 Constipation, unspecified: Secondary | ICD-10-CM

## 2023-06-04 DIAGNOSIS — K567 Ileus, unspecified: Secondary | ICD-10-CM | POA: Diagnosis not present

## 2023-06-04 LAB — CBC
HCT: 40.9 % (ref 39.0–52.0)
Hemoglobin: 14.1 g/dL (ref 13.0–17.0)
MCH: 31 pg (ref 26.0–34.0)
MCHC: 34.5 g/dL (ref 30.0–36.0)
MCV: 89.9 fL (ref 80.0–100.0)
Platelets: 235 10*3/uL (ref 150–400)
RBC: 4.55 MIL/uL (ref 4.22–5.81)
RDW: 14.6 % (ref 11.5–15.5)
WBC: 11.6 10*3/uL — ABNORMAL HIGH (ref 4.0–10.5)
nRBC: 0 % (ref 0.0–0.2)

## 2023-06-04 LAB — BASIC METABOLIC PANEL
Anion gap: 9 (ref 5–15)
BUN: 6 mg/dL — ABNORMAL LOW (ref 8–23)
CO2: 30 mmol/L (ref 22–32)
Calcium: 9 mg/dL (ref 8.9–10.3)
Chloride: 88 mmol/L — ABNORMAL LOW (ref 98–111)
Creatinine, Ser: 0.67 mg/dL (ref 0.61–1.24)
GFR, Estimated: >60 mL/min (ref >60)
Glucose, Bld: 85 mg/dL (ref 70–99)
Potassium: 4 mmol/L (ref 3.5–5.1)
Sodium: 127 mmol/L — ABNORMAL LOW (ref 135–145)

## 2023-06-04 MED ORDER — HYDROMORPHONE HCL 1 MG/ML IJ SOLN
0.5000 mg | INTRAMUSCULAR | Status: DC | PRN
  Administered 2023-06-04 – 2023-06-05 (×2): 0.5 mg via INTRAVENOUS
  Filled 2023-06-04 (×2): qty 0.5

## 2023-06-04 MED ORDER — FUROSEMIDE 10 MG/ML IJ SOLN
20.0000 mg | Freq: Once | INTRAMUSCULAR | Status: AC
  Administered 2023-06-04: 20 mg via INTRAVENOUS
  Filled 2023-06-04: qty 2

## 2023-06-04 MED ORDER — LORAZEPAM 2 MG/ML IJ SOLN
0.5000 mg | Freq: Once | INTRAMUSCULAR | Status: AC
  Administered 2023-06-04: 0.5 mg via INTRAVENOUS
  Filled 2023-06-04: qty 1

## 2023-06-04 NOTE — TOC Progression Note (Signed)
Transition of Care [facility]) - Progression Note    Patient Details  Name: Roy [name] MRN: [id number] Date of Birth: June 17, 1950  Transition of Care [facility]) CM/SW Contact  Eduard Roux, [place] Phone Number: 06/04/2023, 3:02 PM  Clinical Narrative:     Received insurance approval for [facility]  reference # [id number] 1/28-1/30.   Updated [facility]- can admit patient tomorrow   Antony Blackbird, MSW, LCSW Clinical Social Worker    Expected Discharge Plan: Skilled Nursing Facility Barriers to Discharge: Barriers Resolved  Expected Discharge Plan and Services In-house Referral: Clinical Social Work                                             Social Determinants of Health (SDOH) Interventions SDOH Screenings   Food Insecurity: No Food Insecurity (05/25/2023)  Housing: Low Risk  (05/25/2023)  Transportation Needs: No Transportation Needs (05/25/2023)  Utilities: Not At Risk (05/25/2023)  Alcohol Screen: Low Risk  (11/08/2020)  Depression (PHQ2-9): Low Risk  (11/08/2020)  Financial Resource Strain: Low Risk  (05/15/2023)   Received from [facility]  Physical Activity: Unknown (04/25/2023)   Received from [facility]  Recent Concern: Physical Activity - Inactive (04/25/2023)   Received from [facility]  Social Connections: Moderately Isolated (05/25/2023)  Stress: No Stress Concern Present (04/25/2023)   Received from [facility]  Tobacco Use: Low Risk  (05/27/2023)    Readmission Risk Interventions    07/17/2021   12:49 PM  Readmission Risk Prevention Plan  Transportation Screening Complete  HRI or Home Care Consult Complete  Social Work Consult for Recovery Care Planning/Counseling Complete  Palliative Care Screening Not Applicable  Medication Review Oceanographer) Complete

## 2023-06-04 NOTE — TOC Progression Note (Signed)
Transition of Care [facility]) - Progression Note    Patient Details  Name: Roy [name] MRN: [id number] Date of Birth: [birth year]  Transition of Care [facility]) CM/SW Contact  Eduard Roux, [place] Phone Number: 06/04/2023, 8:58 AM  Clinical Narrative:     CSW left voice message with [facility]- waiting on call back- will start insurance auth if SNF has availability.   Antony Blackbird, MSW, LCSW Clinical Social Worker    Expected Discharge Plan: Skilled Nursing Facility Barriers to Discharge: Insurance Authorization  Expected Discharge Plan and Services In-house Referral: Clinical Social Work                                             Social Determinants of Health (SDOH) Interventions SDOH Screenings   Food Insecurity: No Food Insecurity (05/25/2023)  Housing: Low Risk  (05/25/2023)  Transportation Needs: No Transportation Needs (05/25/2023)  Utilities: Not At Risk (05/25/2023)  Alcohol Screen: Low Risk  (11/08/2020)  Depression (PHQ2-9): Low Risk  (11/08/2020)  Financial Resource Strain: Low Risk  (05/15/2023)   Received from [facility]  Physical Activity: Unknown (04/25/2023)   Received from [facility]  Recent Concern: Physical Activity - Inactive (04/25/2023)   Received from [facility]  Social Connections: Moderately Isolated (05/25/2023)  Stress: No Stress Concern Present (04/25/2023)   Received from [facility]  Tobacco Use: Low Risk  (05/27/2023)    Readmission Risk Interventions    07/17/2021   12:49 PM  Readmission Risk Prevention Plan  Transportation Screening Complete  HRI or Home Care Consult Complete  Social Work Consult for Recovery Care Planning/Counseling Complete  Palliative Care Screening Not Applicable  Medication Review Oceanographer) Complete

## 2023-06-04 NOTE — Progress Notes (Addendum)
[facility] PROGRESS NOTE  KAMDEN STANISLAW (DOB: [birth year]) ZOX:[id number] PCP: Rebecka Apley, NP   Brief Narrative: Roy [name] is a 73 y.o. male with hx of primary adrenal insufficiency on chronic glucocorticoid/mineralocorticoid, ankylosing spondylitis, CHB s/p PPM May 2024, PAF on eliquis, HTN, HLD, stage IV DLBCL s/p R-CHOP with no residual disease on last imaging who presented to the ED 1/17 with back pain after a fall. Attempting to enter his truck 1/16, he slipped off the step and fell backwards onto the ground.  At the time he had what he describes as muscle spasms on both flanks but no pain in his mid back.  He was able to get up and walk after the fall.  The following day, he sat up and suddenly heard a pop and had severe midline lower back pain which did not radiate.  He has chronic neuropathy in both legs but says this sensation is worse on the left and has hypoesthesia up to the knee on the left, hypoesthesia to the ankle on the right.  Other than his chronic neuropathy symptoms he has no new numbness.  No weakness or incontinence.   Imaging in the ED was concerning for unstable T11 fracture for which neurosurgery recommended transfer to [facility] for surgical management, strict spinal precautions, and allowing eliquis washout. 1/20, patient underwent posterior spinal fixation Dr. Danielle Dess.  Stay has been complicated by constipation/ileus.  Subjective:  Feels like distention worse No nausea     Objective: BP (!) 96/57 (BP Location: Left Arm)   Pulse 68   Temp 98.7 F (37.1 C) (Axillary)   Resp 20   Ht 6' (1.829 m)   Wt 135.2 kg   SpO2 98%   BMI 40.42 kg/m       General: Appearance:    Severely obese male in no acute distress   High pitched bowel sounds  Lungs:     Diminished, respirations unlabored  Heart:    Normal heart rate.     MS:   All extremities are intact.   Neurologic:   Awake, alert- confused at times     Assessment &  Plan:  Constipation/ileus -bowel regimen -NPO and placed NG tube to suction -ambulate/OOB to chair -GS side bar but do not plan to see  Unstable T11-12 Chance fracture, 2 mm anterolisthesis, additional fracture posterior elements of T10-11:  -Admitted from [facility].  Last dose of Eliquis 1/17 AM.   -Underwent posterior spinal fixation by neurosurgery 1/20.   -Adequate pain management, PT OT  Weightbearing as tolerated.  Referred to SNF -Started on calcium and vitamin D supplementation due to his chronic glucocorticoid use.   Acute hypoxic respiratory failure: CT noting pleural effusion and bibasilar atelectasis, chest x-ray with note of low volumes and left basilar atelectasis.  Suspect likely due to hypoventilation secondary to pain from his vertebral fracture. Suspect hypoventilation due to pain, immobility. Viral swab negative.  - Incentive spirometry is stressed.  - Continue supplemental oxygen as needed to maintain normal WOB and SpO2 >89%.   -O2 low while sleeping as I suspect sleep apnea-- will need outpatient follow up/testing  Ankylosing spondylitis by imaging:  - He will need to have a referral to rheumatology to evaluate for therapy for ankylosing spondylitis.  Likely would benefit from PFTs to evaluate for restrictive lung disease as well.     Hypokalemia Replete agressively  Primary adrenal insufficiency:  - Continue home prednisone 10 mg, fludrocortisone 0.1 mg daily. BP and metabolic  profiles are unremarkable.     Vitamin D deficiency: Level is 20.35.  - Start supplementation w/50k units weekly  Leukocytosis: Afebrile, suspected to be reactive to fractures.  Stable.  CHB status post PPM, PAF:  -eliquis -not on rate control agent  HTN:  -monitor orthostatics   HLD:  - Continue rosuvastatin  Peripheral neuropathy:  - Continue home lyrica  BPH:  - Continue home alfuzosin  Hypothyroidism: Recent TSH wnl. - Continue synthroid  Diffuse large  B-cell lymphoma status post R-CHOP, no residual disease:  - Outpatient surveillance.   SNF when medically stable (ileus resolved)   Joseph Art, DO [facility] [email or web address] 06/04/2023, 1:25 PM

## 2023-06-04 NOTE — TOC Progression Note (Addendum)
Transition of Care [facility]) - Progression Note    Patient Details  Name: Roy [name] MRN: [id number] Date of Birth: 1950/11/02  Transition of Care [facility]) CM/SW Contact  Eduard Roux, [place] Phone Number: 06/04/2023, 12:01 PM  Clinical Narrative:     [facility] can accept patient- TOC will start authorization today.  Patient's son updated   Antony Blackbird, MSW, LCSW Clinical Social Worker    Expected Discharge Plan: Skilled Nursing Facility Barriers to Discharge: Insurance Authorization  Expected Discharge Plan and Services In-house Referral: Clinical Social Work                                             Social Determinants of Health (SDOH) Interventions SDOH Screenings   Food Insecurity: No Food Insecurity (05/25/2023)  Housing: Low Risk  (05/25/2023)  Transportation Needs: No Transportation Needs (05/25/2023)  Utilities: Not At Risk (05/25/2023)  Alcohol Screen: Low Risk  (11/08/2020)  Depression (PHQ2-9): Low Risk  (11/08/2020)  Financial Resource Strain: Low Risk  (05/15/2023)   Received from [facility]  Physical Activity: Unknown (04/25/2023)   Received from [facility]  Recent Concern: Physical Activity - Inactive (04/25/2023)   Received from [facility]  Social Connections: Moderately Isolated (05/25/2023)  Stress: No Stress Concern Present (04/25/2023)   Received from [facility]  Tobacco Use: Low Risk  (05/27/2023)    Readmission Risk Interventions    07/17/2021   12:49 PM  Readmission Risk Prevention Plan  Transportation Screening Complete  HRI or Home Care Consult Complete  Social Work Consult for Recovery Care Planning/Counseling Complete  Palliative Care Screening Not Applicable  Medication Review Oceanographer) Complete

## 2023-06-05 ENCOUNTER — Inpatient Hospital Stay (HOSPITAL_COMMUNITY): Payer: 59

## 2023-06-05 DIAGNOSIS — K567 Ileus, unspecified: Secondary | ICD-10-CM

## 2023-06-05 DIAGNOSIS — E871 Hypo-osmolality and hyponatremia: Secondary | ICD-10-CM | POA: Diagnosis not present

## 2023-06-05 LAB — CBC
HCT: 43.1 % (ref 39.0–52.0)
Hemoglobin: 14.9 g/dL (ref 13.0–17.0)
MCH: 31 pg (ref 26.0–34.0)
MCHC: 34.6 g/dL (ref 30.0–36.0)
MCV: 89.6 fL (ref 80.0–100.0)
Platelets: 248 10*3/uL (ref 150–400)
RBC: 4.81 MIL/uL (ref 4.22–5.81)
RDW: 14.4 % (ref 11.5–15.5)
WBC: 9.7 10*3/uL (ref 4.0–10.5)
nRBC: 0 % (ref 0.0–0.2)

## 2023-06-05 LAB — BASIC METABOLIC PANEL
Anion gap: 12 (ref 5–15)
BUN: 7 mg/dL — ABNORMAL LOW (ref 8–23)
CO2: 30 mmol/L (ref 22–32)
Calcium: 8.6 mg/dL — ABNORMAL LOW (ref 8.9–10.3)
Chloride: 84 mmol/L — ABNORMAL LOW (ref 98–111)
Creatinine, Ser: 0.94 mg/dL (ref 0.61–1.24)
GFR, Estimated: >60 mL/min (ref >60)
Glucose, Bld: 71 mg/dL (ref 70–99)
Potassium: 3.5 mmol/L (ref 3.5–5.1)
Sodium: 126 mmol/L — ABNORMAL LOW (ref 135–145)

## 2023-06-05 MED ORDER — SODIUM CHLORIDE 0.9 % IV SOLN: INTRAVENOUS | Status: DC

## 2023-06-05 MED ORDER — POLYETHYLENE GLYCOL 3350 17 G PO PACK
17.0000 g | PACK | Freq: Two times a day (BID) | ORAL | Status: DC
  Administered 2023-06-05 – 2023-06-06 (×3): 17 g via ORAL
  Filled 2023-06-05 (×3): qty 1

## 2023-06-05 MED ORDER — SENNOSIDES-DOCUSATE SODIUM 8.6-50 MG PO TABS
2.0000 | ORAL_TABLET | Freq: Two times a day (BID) | ORAL | Status: DC
  Administered 2023-06-05 – 2023-06-06 (×3): 2 via ORAL
  Filled 2023-06-05 (×3): qty 2

## 2023-06-05 NOTE — TOC Progression Note (Signed)
Transition of Care [facility]) - Progression Note    Patient Details  Name: Roy [name] MRN: [id number] Date of Birth: July 14, 1950  Transition of Care [facility]) CM/SW Contact  Eduard Roux, [place] Phone Number: 06/05/2023, 1:59 PM  Clinical Narrative:     Received insurance 1/28 -1/30  navi Auth ID # [id number]   Updated [facility]   TOC will continue to follow and assist with discharge planning.  Antony Blackbird, MSW, LCSW Clinical Social Worker    Expected Discharge Plan: Skilled Nursing Facility Barriers to Discharge: Barriers Resolved  Expected Discharge Plan and Services In-house Referral: Clinical Social Work                                             Social Determinants of Health (SDOH) Interventions SDOH Screenings   Food Insecurity: No Food Insecurity (05/25/2023)  Housing: Low Risk  (05/25/2023)  Transportation Needs: No Transportation Needs (05/25/2023)  Utilities: Not At Risk (05/25/2023)  Alcohol Screen: Low Risk  (11/08/2020)  Depression (PHQ2-9): Low Risk  (11/08/2020)  Financial Resource Strain: Low Risk  (05/15/2023)   Received from [facility]  Physical Activity: Unknown (04/25/2023)   Received from [facility]  Recent Concern: Physical Activity - Inactive (04/25/2023)   Received from [facility]  Social Connections: Moderately Isolated (05/25/2023)  Stress: No Stress Concern Present (04/25/2023)   Received from [facility]  Tobacco Use: Low Risk  (05/27/2023)    Readmission Risk Interventions    07/17/2021   12:49 PM  Readmission Risk Prevention Plan  Transportation Screening Complete  HRI or Home Care Consult Complete  Social Work Consult for Recovery Care Planning/Counseling Complete  Palliative Care Screening Not Applicable  Medication Review Oceanographer) Complete

## 2023-06-05 NOTE — Progress Notes (Signed)
[facility] PROGRESS NOTE  Roy [name] (DOB: [birth year]) ZOX:[id number] PCP: Rebecka Apley, NP   Brief Narrative: Roy [name] is a 73 y.o. male with hx of primary adrenal insufficiency on chronic glucocorticoid/mineralocorticoid, ankylosing spondylitis, CHB s/p PPM May 2024, PAF on eliquis, HTN, HLD, stage IV DLBCL s/p R-CHOP with no residual disease on last imaging who presented to the ED 1/17 with back pain after a fall. Attempting to enter his truck 1/16, he slipped off the step and fell backwards onto the ground.  At the time he had what he describes as muscle spasms on both flanks but no pain in his mid back.  He was able to get up and walk after the fall.  The following day, he sat up and suddenly heard a pop and had severe midline lower back pain which did not radiate.  He has chronic neuropathy in both legs but says this sensation is worse on the left and has hypoesthesia up to the knee on the left, hypoesthesia to the ankle on the right.  Other than his chronic neuropathy symptoms he has no new numbness.  No weakness or incontinence.   Imaging in the ED was concerning for unstable T11 fracture for which neurosurgery recommended transfer to [facility] for surgical management, strict spinal precautions, and allowing eliquis washout. 1/20, patient underwent posterior spinal fixation Dr. Danielle Dess.  Stay has been complicated by constipation/ileus.  Subjective: Patient mentions that his abdomen feels much better this morning.  According to nursing staff he did have some stool from his rectal tube overnight.  Patient denies any nausea this morning.     Objective: BP 101/65 (BP Location: Left Arm)   Pulse 61   Temp 97.6 F (36.4 C) (Oral)   Resp 17   Ht 6' (1.829 m)   Wt 135.2 kg   SpO2 95%   BMI 40.42 kg/m    General appearance: Awake alert.  In no distress Resp: Clear to auscultation bilaterally.  Normal effort Cardio: S1-S2 is normal regular.  No S3-S4.  No rubs murmurs or  bruit GI: Abdomen is soft.  Still noted to be mildly distended but soft.  Nontender.  Bowel sounds sluggish but present. Extremities: No edema.  Full range of motion of lower extremities. Neurologic:  No focal neurological deficits.    Assessment & Plan:  Constipation/ileus X-ray of the abdomen revealed ileus.  Patient was started on bowel regimen.  Also had an NG tube to suction.  NG tube subsequently removed. Started on bowel regimen.  He had a bowel movement last night.  He mentioned that he feels better this morning. Chest x-ray from this morning continues to show ileus pattern.  However patient mentions that he feels better.  Will continue with bowel regimen.  Will let him have clear liquids today. If his symptoms do not improve or if they recur he may need a CT scan of the abdomen and pelvis. Continue to mobilize.  Out of bed to chair.  Unstable T11-12 Chance fracture, 2 mm anterolisthesis, additional fracture posterior elements of T10-11:  -Admitted from [facility].  -Underwent posterior spinal fixation by neurosurgery 1/20.   -Started on calcium and vitamin D supplementation due to his chronic glucocorticoid use. Seen by PT and OT.  Plan is for skilled nursing facility placement for rehab.   Acute hypoxic respiratory failure:  CT noting pleural effusion and bibasilar atelectasis, chest x-ray with note of low volumes and left basilar atelectasis.  Suspect likely due to  hypoventilation secondary to pain from his vertebral fracture.  Suspect hypoventilation due to pain, immobility. Viral swab negative.  - Incentive spirometry is stressed.  - Continue supplemental oxygen as needed to maintain normal WOB and SpO2 >89%.   -O2 low while sleeping as I suspect sleep apnea-- will need outpatient follow up/testing  Ankylosing spondylitis by imaging:  - He will need to have a referral to rheumatology to evaluate for therapy for ankylosing spondylitis.  Likely would benefit from PFTs  to evaluate for restrictive lung disease as well.     Hyponatremia Likely due to hypovolemia due to his ileus and inability to take orally.  Will give hydration with normal saline.  Recheck labs tomorrow.  Hypokalemia Continue to supplement.  Primary adrenal insufficiency:  - Continue home prednisone 10 mg, fludrocortisone 0.1 mg daily. BP and metabolic profiles are unremarkable.   Vitamin D deficiency: Level is 20.35.  - Started supplementation w/50k units weekly  Leukocytosis Afebrile, suspected to be reactive to fractures.  Stable.  CHB status post PPM/paroxysmal atrial fibrillation -eliquis -not on rate control agent  Essential hypertension  -monitor orthostatics  HLD:  - Continue rosuvastatin  Peripheral neuropathy:  - Continue home lyrica  BPH:  - Continue home alfuzosin  Hypothyroidism: Recent TSH wnl. - Continue synthroid  Diffuse large B-cell lymphoma status post R-CHOP, no residual disease:  - Outpatient surveillance.      Osvaldo Shipper,  [facility] [email or web address] 06/05/2023, 10:04 AM

## 2023-06-05 NOTE — Progress Notes (Signed)
Physical Therapy Treatment Patient Details Name: Roy [name] MRN: [id number] DOB: [birth year] Today's Date: 06/05/2023   History of Present Illness 73 yo male 1/17 s/p fall with unstable T11-T12 chance fx. Pt 1/20 internal fixation T11-T12 pedicule screw from T10-L1  PMH ankylosing spondylitis,adrenal insufficiency, CHB s/p PPM 09/2022, PAF on eliquis, HTN, HLD, stage IV DLBCL s/p R-CHOP, chronic neuropathy, L THA,    PT Comments  Patient amazingly reporting no pain though with difficulty sitting EOB today leaning heavily back on UE's needing cues and at times assist for anterior weight shift.  Stood and took steps though with gradual weakness in LE's needing chair for safety to sit.  Patient participating in LE exercises in supine and sitting.  Remains appropriate for inpatient rehab (<3 hours/day) at d/c.     If plan is discharge home, recommend the following: A lot of help with walking and/or transfers;A lot of help with bathing/dressing/bathroom   Can travel by private vehicle     No  Equipment Recommendations  Other (comment) (TBA)    Recommendations for Other Services       Precautions / Restrictions Precautions Precautions: Fall;Back Precaution Comments: rectal tube     Mobility  Bed Mobility Overal bed mobility: Needs Assistance Bed Mobility: Rolling, Sidelying to Sit Rolling: Mod assist Sidelying to sit: Mod assist, +2 for safety/equipment       General bed mobility comments: heavy lifting for trunk to sit though pt reaching to pull up with HHA    Transfers Overall transfer level: Needs assistance Equipment used: Rolling walker (2 wheels) Transfers: Sit to/from Stand Sit to Stand: From elevated surface, +2 physical assistance, Mod assist, Min assist           General transfer comment: assist for balance as patient posterior while on EOB some lifting help to stand though between min to mod A of 2 for safety    Ambulation/Gait Ambulation/Gait assistance:  Mod assist, +2 safety/equipment Gait Distance (Feet): 5 Feet Assistive device: Rolling walker (2 wheels) Gait Pattern/deviations: Step-to pattern, Decreased stride length       General Gait Details: imbalance throughout and assist for balance/safety with chair brought up to pt to sit   Stairs             Wheelchair Mobility     Tilt Bed    Modified Rankin (Stroke Patients Only)       Balance Overall balance assessment: Needs assistance Sitting-balance support: Feet supported, Bilateral upper extremity supported Sitting balance-Leahy Scale: Poor Sitting balance - Comments: leaning back heavily on UE's; difficulty coming forward   Standing balance support: Bilateral upper extremity supported Standing balance-Leahy Scale: Poor Standing balance comment: reliant on RW for support                            Cognition Arousal: Alert Behavior During Therapy: Flat affect Overall Cognitive Status: Impaired/Different from baseline Area of Impairment: Memory, Attention, Following commands                   Current Attention Level: Selective Memory: Decreased recall of precautions, Decreased short-term memory Following Commands: Follows one step commands consistently, Follows one step commands with increased time Safety/Judgement: Decreased awareness of safety   Problem Solving: Slow processing          Exercises General Exercises - Lower Extremity Ankle Circles/Pumps: AROM, Both, 10 reps, Supine Long Arc Quad: AROM, Both, 10 reps, Seated Heel Slides: AROM,  Both, 5 reps, Supine Hip Flexion/Marching: AROM, Both, Seated, 10 reps    General Comments General comments (skin integrity, edema, etc.): some dyspnea with mobility O2 at 2 LPM throughout and SpO2 88-91%      Pertinent Vitals/Pain Pain Assessment Pain Assessment: No/denies pain    Home Living                          Prior Function            PT Goals (current goals can  now be found in the care plan section) Progress towards PT goals: Progressing toward goals (slowly)    Frequency    Min 1X/week      PT Plan      Co-evaluation              AM-PAC PT "6 Clicks" Mobility   Outcome Measure  Help needed turning from your back to your side while in a flat bed without using bedrails?: A Lot Help needed moving from lying on your back to sitting on the side of a flat bed without using bedrails?: A Lot Help needed moving to and from a bed to a chair (including a wheelchair)?: A Lot Help needed standing up from a chair using your arms (e.g., wheelchair or bedside chair)?: A Lot Help needed to walk in hospital room?: Total Help needed climbing 3-5 steps with a railing? : Total 6 Click Score: 10    End of Session Equipment Utilized During Treatment: Gait belt Activity Tolerance: Patient tolerated treatment well Patient left: in chair;with call bell/phone within reach;with chair alarm set   PT Visit Diagnosis: Other abnormalities of gait and mobility (R26.89);Muscle weakness (generalized) (M62.81)     Time: 2956-2130 PT Time Calculation (min) (ACUTE ONLY): 21 min  Charges:    $Therapeutic Activity: 8-22 mins PT General Charges $$ ACUTE PT VISIT: 1 Visit                     Sheran Lawless, PT Acute Rehabilitation Services Office:[phone] 06/05/2023    [name] 06/05/2023, 5:23 PM

## 2023-06-06 DIAGNOSIS — S22088A Other fracture of T11-T12 vertebra, initial encounter for closed fracture: Secondary | ICD-10-CM | POA: Diagnosis not present

## 2023-06-06 LAB — CBC
HCT: 42.4 % (ref 39.0–52.0)
Hemoglobin: 14.3 g/dL (ref 13.0–17.0)
MCH: 30.8 pg (ref 26.0–34.0)
MCHC: 33.7 g/dL (ref 30.0–36.0)
MCV: 91.2 fL (ref 80.0–100.0)
Platelets: 273 10*3/uL (ref 150–400)
RBC: 4.65 MIL/uL (ref 4.22–5.81)
RDW: 14.3 % (ref 11.5–15.5)
WBC: 8.5 10*3/uL (ref 4.0–10.5)
nRBC: 0 % (ref 0.0–0.2)

## 2023-06-06 LAB — BASIC METABOLIC PANEL
Anion gap: 7 (ref 5–15)
BUN: 5 mg/dL — ABNORMAL LOW (ref 8–23)
CO2: 33 mmol/L — ABNORMAL HIGH (ref 22–32)
Calcium: 8.9 mg/dL (ref 8.9–10.3)
Chloride: 93 mmol/L — ABNORMAL LOW (ref 98–111)
Creatinine, Ser: 0.56 mg/dL — ABNORMAL LOW (ref 0.61–1.24)
GFR, Estimated: >60 mL/min (ref >60)
Glucose, Bld: 83 mg/dL (ref 70–99)
Potassium: 3.8 mmol/L (ref 3.5–5.1)
Sodium: 133 mmol/L — ABNORMAL LOW (ref 135–145)

## 2023-06-06 MED ORDER — METHOCARBAMOL 500 MG PO TABS: 500.0000 mg | ORAL_TABLET | Freq: Four times a day (QID) | ORAL | Status: DC | PRN

## 2023-06-06 MED ORDER — POLYETHYLENE GLYCOL 3350 17 G PO PACK: 17.0000 g | PACK | Freq: Two times a day (BID) | ORAL | Status: DC

## 2023-06-06 MED ORDER — CALCIUM CARBONATE 1250 (500 CA) MG PO TABS: 1.0000 | ORAL_TABLET | Freq: Two times a day (BID) | ORAL | Status: AC

## 2023-06-06 MED ORDER — PREGABALIN 200 MG PO CAPS: 200.0000 mg | ORAL_CAPSULE | Freq: Two times a day (BID) | ORAL | 0 refills | Status: DC

## 2023-06-06 MED ORDER — MIDODRINE HCL 5 MG PO TABS: 5.0000 mg | ORAL_TABLET | Freq: Two times a day (BID) | ORAL | Status: DC

## 2023-06-06 MED ORDER — OXYCODONE-ACETAMINOPHEN 5-325 MG PO TABS: 1.0000 | ORAL_TABLET | Freq: Four times a day (QID) | ORAL | 0 refills | Status: DC | PRN

## 2023-06-06 MED ORDER — BISACODYL 10 MG RE SUPP: 10.0000 mg | Freq: Every day | RECTAL | Status: DC | PRN

## 2023-06-06 MED ORDER — VITAMIN D (ERGOCALCIFEROL) 1.25 MG (50000 UNIT) PO CAPS: 50000.0000 [IU] | ORAL_CAPSULE | ORAL | Status: DC

## 2023-06-06 MED ORDER — SENNOSIDES-DOCUSATE SODIUM 8.6-50 MG PO TABS: 2.0000 | ORAL_TABLET | Freq: Two times a day (BID) | ORAL | Status: DC

## 2023-06-06 NOTE — TOC Transition Note (Signed)
Transition of Care [facility]) - Discharge Note   Patient Details  Name: Roy [name] MRN: [id number] Date of Birth: Nov 19, 1950  Transition of Care [facility]) CM/SW Contact:  Mearl Latin, LCSW Phone Number: 06/06/2023, 4:08 PM   Clinical Narrative:    Patient will DC to: [facility] Anticipated DC date: 06/06/23 Family notified: Son, Viviann Spare Transport by: Sharin Mons   Per MD patient ready for DC to [facility]. RN to call report prior to discharge [phone]). RN, patient, patient's family, and facility notified of DC. Discharge Summary and FL2 sent to facility. DC packet on chart including signed scripts. Ambulance transport requested for patient.   CSW will sign off for now as social work intervention is no longer needed. Please consult [place] again if new needs arise.     Final next level of care: Skilled Nursing Facility Barriers to Discharge: Barriers Resolved   Patient Goals and CMS Choice   CMS Medicare.gov Compare Post Acute Care list provided to:: Patient Choice offered to / list presented to : Patient  ownership interest in [facility]provided to:: Patient    Discharge Placement   Existing PASRR number confirmed : 06/06/23          Patient chooses bed at: [facility] Patient to be transferred to facility by: [facility] Name of family member notified: Son Patient and family notified of of transfer: 06/06/23  Discharge Plan and Services Additional resources added to the After Visit Summary for   In-house Referral: Clinical Social Work                                   Social Drivers of Health (SDOH) Interventions SDOH Screenings   Food Insecurity: No Food Insecurity (05/25/2023)  Housing: Low Risk  (05/25/2023)  Transportation Needs: No Transportation Needs (05/25/2023)  Utilities: Not At Risk (05/25/2023)  Alcohol Screen: Low Risk  (11/08/2020)  Depression (PHQ2-9): Low Risk  (11/08/2020)  Financial Resource Strain: Low Risk  (05/15/2023)    Received from [facility]  Physical Activity: Unknown (04/25/2023)   Received from [facility]  Recent Concern: Physical Activity - Inactive (04/25/2023)   Received from [facility]  Social Connections: Moderately Isolated (05/25/2023)  Stress: No Stress Concern Present (04/25/2023)   Received from [facility]  Tobacco Use: Low Risk  (05/27/2023)     Readmission Risk Interventions    07/17/2021   12:49 PM  Readmission Risk Prevention Plan  Transportation Screening Complete  HRI or Home Care Consult Complete  Social Work Consult for Recovery Care Planning/Counseling Complete  Palliative Care Screening Not Applicable  Medication Review Oceanographer) Complete

## 2023-06-06 NOTE — Progress Notes (Signed)
Occupational Therapy Treatment Patient Details Name: Roy [name] MRN: [id number] DOB: 08-02-1950 Today's Date: 06/06/2023   History of present illness 73 yo male 1/17 s/p fall with unstable T11-T12 chance fx. Pt 1/20 internal fixation T11-T12 pedicule screw from T10-L1  PMH ankylosing spondylitis,adrenal insufficiency, CHB s/p PPM 09/2022, PAF on eliquis, HTN, HLD, stage IV DLBCL s/p R-CHOP, chronic neuropathy, L THA,   OT comments  Patient demonstrated gains with OT treatment with bed mobility, transfers, and grooming tasks. Patient with back pain during mobility but stated limited to no pain when sitting up in chair. Patient will benefit from continued inpatient follow up therapy, <3 hours/day due to patient would not be able to tolerate >3 hours/day therapy. Acute OT to continue to follow to address established goals to facilitate DC to next venue of care.       If plan is discharge home, recommend the following:  Two people to help with walking and/or transfers;Two people to help with bathing/dressing/bathroom   Equipment Recommendations  BSC/3in1;Hospital bed;Wheelchair cushion (measurements OT);Wheelchair (measurements OT);Other (comment) (RW-all DME bariatric)    Recommendations for Other Services      Precautions / Restrictions Precautions Precautions: Fall;Back Precaution Comments: rectal tube Restrictions Weight Bearing Restrictions Per Provider Order: No       Mobility Bed Mobility Overal bed mobility: Needs Assistance Bed Mobility: Rolling, Sidelying to Sit Rolling: Mod assist Sidelying to sit: Max assist       General bed mobility comments: cues for log rolling and bed pad used to assist with rolling. assistance with raising trunk    Transfers Overall transfer level: Needs assistance Equipment used: Rolling walker (2 wheels) Transfers: Sit to/from Stand, Bed to chair/wheelchair/BSC Sit to Stand: Mod assist, From elevated surface     Step pivot transfers:  Mod assist     General transfer comment: cues for hand placement and mod assist to stand from raised bed to RW. mod assist for transfer due to assistance with balance and walker management     Balance Overall balance assessment: Needs assistance Sitting-balance support: Feet supported, Bilateral upper extremity supported Sitting balance-Leahy Scale: Poor Sitting balance - Comments: CGA  to min assist for sitting EOB due to posterior leaning Postural control: Posterior lean Standing balance support: Bilateral upper extremity supported Standing balance-Leahy Scale: Poor Standing balance comment: reliant on RW for support                           ADL either performed or assessed with clinical judgement   ADL Overall ADL's : Needs assistance/impaired     Grooming: Wash/dry hands;Wash/dry face;Oral care;Sitting;Supervision/safety;Cueing for Loss adjuster, chartered: Moderate assistance;Rolling walker (2 wheels) Toilet Transfer Details (indicate cue type and reason): simulated to recliner Toileting- Clothing Manipulation and Hygiene: Total assistance              Extremity/Trunk Assessment              Vision       Perception     Praxis      Cognition Arousal: Alert Behavior During Therapy: Flat affect Overall Cognitive Status: Impaired/Different from baseline                     Current Attention Level: Selective Memory: Decreased recall of precautions, Decreased short-term memory Following Commands: Follows one step commands consistently, Follows one  step commands with increased time Safety/Judgement: Decreased awareness of safety   Problem Solving: Slow processing General Comments: Motivated to get OOB, following commands with increased time, cues for back precautions        Exercises      Shoulder Instructions       General Comments VSS on 2 liters    Pertinent Vitals/ Pain       Pain  Assessment Pain Assessment: Faces Faces Pain Scale: Hurts little more Pain Location: back with mobility Pain Descriptors / Indicators: Operative site guarding, Grimacing, Guarding Pain Intervention(s): Limited activity within patient's tolerance, Monitored during session, Premedicated before session, Repositioned  Home Living                                          Prior Functioning/Environment              Frequency  Min 1X/week        Progress Toward Goals  OT Goals(current goals can now be found in the care plan section)  Progress towards OT goals: Progressing toward goals  Acute Rehab OT Goals Patient Stated Goal: get better OT Goal Formulation: With patient Time For Goal Achievement: 06/11/23 Potential to Achieve Goals: Good ADL Goals Pt Will Perform Upper Body Bathing: with set-up;sitting Pt Will Transfer to Toilet: with mod assist;bedside commode;ambulating Additional ADL Goal #1: pt will complete bed mobility with rails min (A) Additional ADL Goal #2: pt will complete sit<>Stand with RW total +2 mod (A)  Plan      Co-evaluation                 AM-PAC OT "6 Clicks" Daily Activity     Outcome Measure   Help from another person eating meals?: A Little Help from another person taking care of personal grooming?: A Little Help from another person toileting, which includes using toliet, bedpan, or urinal?: A Lot Help from another person bathing (including washing, rinsing, drying)?: A Lot Help from another person to put on and taking off regular upper body clothing?: A Lot Help from another person to put on and taking off regular lower body clothing?: A Lot 6 Click Score: 14    End of Session Equipment Utilized During Treatment: Gait belt;Rolling walker (2 wheels);Oxygen  OT Visit Diagnosis: Unsteadiness on feet (R26.81);Muscle weakness (generalized) (M62.81)   Activity Tolerance Patient tolerated treatment well   Patient Left in  chair;with call bell/phone within reach;with chair alarm set   Nurse Communication Mobility status;Precautions        Time: [phone] OT Time Calculation (min): 26 min  Charges: OT General Charges $OT Visit: 1 Visit OT Treatments $Self Care/Home Management : 8-22 mins $Therapeutic Activity: 8-22 mins  Alfonse Flavors, OTA Acute Rehabilitation Services  Office [phone]   Dewain Penning 06/06/2023, 12:40 PM

## 2023-06-06 NOTE — Progress Notes (Signed)
Pt with orders to d/c to [facility] via [facility]. Report called all questions answered. Discharge packet and prescription placed in chart for [facility].

## 2023-06-06 NOTE — TOC Progression Note (Signed)
Transition of Care [facility]) - Progression Note    Patient Details  Name: Roy [name] MRN: [id number] Date of Birth: 22-Oct-1950  Transition [facility] Care [facility]) CM/SW Contact  Mearl Latin, LCSW Phone Number: 06/06/2023, 3:13 PM  Clinical Narrative:    CSW made [facility] aware of patient's discharge and they are ready for patient. CSW updated patient's son and he requested [facility] for transport.     Expected Discharge Plan: Skilled Nursing Facility Barriers to Discharge: Barriers Resolved  Expected Discharge Plan and Services In-house Referral: Clinical Social Work       Expected Discharge Date: 06/06/23                                     Social Determinants of Health (SDOH) Interventions SDOH Screenings   Food Insecurity: No Food Insecurity (05/25/2023)  Housing: Low Risk  (05/25/2023)  Transportation Needs: No Transportation Needs (05/25/2023)  Utilities: Not At Risk (05/25/2023)  Alcohol Screen: Low Risk  (11/08/2020)  Depression (PHQ2-9): Low Risk  (11/08/2020)  Financial Resource Strain: Low Risk  (05/15/2023)   Received from [facility]  Physical Activity: Unknown (04/25/2023)   Received from [facility]  Recent Concern: Physical Activity - Inactive (04/25/2023)   Received from [facility]  Social Connections: Moderately Isolated (05/25/2023)  Stress: No Stress Concern Present (04/25/2023)   Received from [facility]  Tobacco Use: Low Risk  (05/27/2023)    Readmission Risk Interventions    07/17/2021   12:49 PM  Readmission Risk Prevention Plan  Transportation Screening Complete  HRI or Home Care Consult Complete  Social Work Consult for Recovery Care Planning/Counseling Complete  Palliative Care Screening Not Applicable  Medication Review Oceanographer) Complete

## 2023-06-06 NOTE — Discharge Summary (Signed)
[facility]  Physician Discharge Summary   Patient ID: Roy [name] MRN: [id number] DOB/AGE: 1950-07-02 73 y.o.  Admit date: 05/24/2023 Discharge date:   06/06/2023   PCP: Rebecka Apley, NP  DISCHARGE DIAGNOSES:    T11 vertebral fracture (HCC) Ileus, resolving Acute respiratory failure with hypoxia Ankylosing spondylitis by imaging, requiring further outpatient evaluation Hyponatremia Hypokalemia History of primary adrenal insufficiency Vitamin D deficiency History of complete heart block status post pacemaker Paroxysmal atrial fibrillation Essential hypertension Hypothyroidism    RECOMMENDATIONS FOR OUTPATIENT FOLLOW UP: Will need follow-up with neurosurgery in a few weeks PCP to consider referral to rheumatology for concern for ankylosing spondylitis noted on imaging study. Please check CBC and basic metabolic panel in 3 to 4 days.   Home Health: SNF Equipment/Devices: None  CODE STATUS: Full code  DISCHARGE CONDITION: fair  Diet recommendation: Heart healthy  INITIAL HISTORY: Roy [name] is a 73 y.o. male with hx of primary adrenal insufficiency on chronic glucocorticoid/mineralocorticoid, ankylosing spondylitis, CHB s/p PPM May 2024, PAF on eliquis, HTN, HLD, stage IV DLBCL s/p R-CHOP with no residual disease on last imaging who presented to the ED 1/17 with back pain after a fall. Attempting to enter his truck 1/16, he slipped off the step and fell backwards onto the ground.  At the time he had what he describes as muscle spasms on both flanks but no pain in his mid back.  He was able to get up and walk after the fall.  The following day, he sat up and suddenly heard a pop and had severe midline lower back pain which did not radiate.  He has chronic neuropathy in both legs but says this sensation is worse on the left and has hypoesthesia up to the knee on the left, hypoesthesia to the ankle on the right.  Other than his chronic neuropathy symptoms  he has no new numbness.  No weakness or incontinence.    Imaging in the ED was concerning for unstable T11 fracture for which neurosurgery recommended transfer to [facility] for surgical management, strict spinal precautions, and allowing eliquis washout. 1/20, patient underwent posterior spinal fixation Dr. Danielle Dess.  Stay has been complicated by constipation/ileus.  HOSPITAL COURSE:   Constipation/ileus X-ray of the abdomen revealed ileus.  Patient was started on bowel regimen.  Also had an NG tube to suction.  NG tube subsequently removed. Started on bowel regimen.  Feels much better.  Had bowel movements overnight.  Advance to soft diet today.  If he tolerates his diet then he should be able to go to rehab this afternoon.   Unstable T11-12 Chance fracture, 2 mm anterolisthesis, additional fracture posterior elements of T10-11:  -Admitted from [facility].  -Underwent posterior spinal fixation by neurosurgery 1/20.   -Started on calcium and vitamin D supplementation due to his chronic glucocorticoid use. Seen by PT and OT.  Plan is for skilled nursing facility placement for rehab.   Acute hypoxic respiratory failure:  CT noting pleural effusion and bibasilar atelectasis, chest x-ray with note of low volumes and left basilar atelectasis.   Suspect likely due to hypoventilation secondary to pain from his vertebral fracture.  Suspect hypoventilation due to pain, immobility. Viral swab negative.  Continue with incentive spirometry. Would recommend sleep study in the outpatient setting to rule out sleep apnea. Likely would benefit from PFTs to evaluate for restrictive lung disease as well.     Ankylosing spondylitis by imaging:  - He will need to have a referral  to rheumatology to evaluate for therapy for ankylosing spondylitis.     Hyponatremia Likely due to hypovolemia due to his ileus and inability to take orally.  Improved with IV hydration.   Hypokalemia Supplemented   Primary  adrenal insufficiency:  Continue home prednisone 10 mg, fludrocortisone 0.1 mg daily.    Vitamin D deficiency: Level is 20.35.  Started supplementation w/50k units weekly   Leukocytosis Afebrile, suspected to be reactive to fractures.  Resolved.   CHB status post PPM/paroxysmal atrial fibrillation Continue Eliquis.  Not on any rate limiting drugs.   Essential hypertension  Noted to be quite orthostatic here.  Was started on midodrine.  Occasional high blood pressure readings noted.  Will decrease midodrine to twice a day.  Furosemide is on hold.   HLD:  - Continue rosuvastatin   Peripheral neuropathy:  - Continue home lyrica   BPH:  - Continue home alfuzosin   Hypothyroidism: Recent TSH wnl. - Continue synthroid   Diffuse large B-cell lymphoma status post R-CHOP, no residual disease:  - Outpatient surveillance.   Obesity Estimated body mass index is 40.42 kg/m as calculated from the following:   Height as of this encounter: 6' (1.829 m).   Weight as of this encounter: 135.2 kg.  Patient is stable.  Okay for discharge to SNF later today if he tolerates his diet.  PERTINENT LABS:  The results of significant diagnostics from this hospitalization (including imaging, microbiology, ancillary and laboratory) are listed below for reference.     Labs:   Basic Metabolic Panel: Recent Labs  Lab 06/01/23 1116 06/02/23 1245 06/03/23 0437 06/04/23 0525 06/05/23 0552 06/06/23 0424  NA 131* 130* 130* 127* 126* 133*  K 2.5* 3.9 3.7 4.0 3.5 3.8  CL 81* 89* 90* 88* 84* 93*  CO2 40* 30 28 30 30  33*  GLUCOSE 122* 128* 81 85 71 83  BUN 8 8 8  6* 7* 5*  CREATININE 0.66 0.68 0.58* 0.67 0.94 0.56*  CALCIUM 9.1 8.8* 8.8* 9.0 8.6* 8.9  MG 2.1  --   --   --   --   --     CBC: Recent Labs  Lab 05/31/23 0554 06/01/23 0440 06/02/23 0855 06/03/23 0437 06/04/23 0525 06/05/23 0552 06/06/23 0424  WBC 11.6*   < > 12.6* 13.9* 11.6* 9.7 8.5  NEUTROABS 8.5*  --   --   --   --    --   --   HGB 14.7   < > 14.6 14.3 14.1 14.9 14.3  HCT 42.9   < > 41.9 42.0 40.9 43.1 42.4  MCV 92.7   < > 90.3 91.5 89.9 89.6 91.2  PLT 209   < > 225 231 235 248 273   < > = values in this interval not displayed.     IMAGING STUDIES DG Abd Portable 1V Result Date: 06/05/2023 CLINICAL DATA:  Ileus EXAM: PORTABLE ABDOMEN - 1 VIEW COMPARISON:  Yesterday FINDINGS: Diffuse gaseous distension of small and large bowel. No evidence of pneumatosis or progression. No concerning mass effect or calcification. There is thoracolumbar spine ankylosis and fusion hardware. IMPRESSION: Unchanged generalized ileus pattern. Electronically Signed   By: Tiburcio Pea M.D.   On: 06/05/2023 08:54   DG Abd 1 View Result Date: 06/04/2023 CLINICAL DATA:  NG tube advancement. EXAM: ABDOMEN - 1 VIEW COMPARISON:  Earlier today FINDINGS: The enteric tube has been advanced, the tip and side port are below the diaphragm in the stomach. No gaseous bowel distention in  the included upper abdomen. Right upper quadrant surgical clips. IMPRESSION: Enteric tube tip and side port below the diaphragm in the stomach. Electronically Signed   By: Narda Rutherford M.D.   On: 06/04/2023 16:30   DG Abd 1 View Result Date: 06/04/2023 CLINICAL DATA:  Feeding tube placement. EXAM: ABDOMEN - 1 VIEW COMPARISON:  Abdominal radiograph dated 06/04/2023. FINDINGS: Partially visualized enteric tube with poorly visualized tip over the mediastinum, likely in mid to distal esophagus. Recommend further advancing and repeat radiograph. IMPRESSION: Enteric tube with tip over the mediastinum. Recommend further advancing and repeat radiograph. Electronically Signed   By: Elgie Collard M.D.   On: 06/04/2023 15:19   DG Abd Portable 1V Result Date: 06/04/2023 CLINICAL DATA:  Abdominal distension. EXAM: PORTABLE ABDOMEN - 1 VIEW COMPARISON:  Radiograph 06/02/2023 FINDINGS: Mild gaseous gastric distension. Slight increasing gaseous small and large bowel  distension. Right upper quadrant surgical clips. Thoracolumbar and left hip hardware. IMPRESSION: Gaseous bowel distension slightly worsened over the last 2 days, suggestive of ileus. Electronically Signed   By: Narda Rutherford M.D.   On: 06/04/2023 12:08   DG CHEST PORT 1 VIEW Result Date: 06/04/2023 CLINICAL DATA:  Dyspnea. EXAM: PORTABLE CHEST 1 VIEW COMPARISON:  05/24/2023 FINDINGS: Left-sided pacemaker and right chest port remain in place. Anti lordotic positioning. Development of hazy bibasilar opacities which may represent atelectasis, effusion, or combination there of. Stable heart size and mediastinal contours. No pneumothorax. No pulmonary edema. IMPRESSION: Development of hazy bibasilar opacities which may represent atelectasis, effusion, or combination there of. Electronically Signed   By: Narda Rutherford M.D.   On: 06/04/2023 12:07   DG Abd Portable 1V Result Date: 06/02/2023 CLINICAL DATA:  Abdominal distention. EXAM: PORTABLE ABDOMEN - 1 VIEW COMPARISON:  None Available. FINDINGS: Mild diffuse gaseous distention of small bowel and colon is seen, consistent with ileus. IMPRESSION: Ileus pattern. Electronically Signed   By: Danae Orleans M.D.   On: 06/02/2023 14:06   MR THORACIC SPINE WO CONTRAST Result Date: 05/28/2023 CLINICAL DATA:  Thoracic spine fracture.  Ankylosing spondylitis. EXAM: MRI THORACIC SPINE WITHOUT CONTRAST TECHNIQUE: Multiplanar, multisequence MR imaging of the thoracic spine was performed. No intravenous contrast was administered. COMPARISON:  CT 05/24/2023 FINDINGS: Alignment: Normal alignment in the sagittal plane. Continued mild distraction at the level of the T11-12 disc with fluid intensity material between the T11 vertebral body in the majority of the T11-12 disc. Vertebrae: Interval spinal fusion with pedicle screws and posterior rods in place. Pedicle screws are present at T10, T11, T12 and L1. Components appear well positioned without complicating feature. Cord:  No cord lesion. Some blood products in the posterior epidural fat extending from T7 into the lumbar region, but not causing visible compressive effect upon the cord. Paraspinal and other soft tissues: I do think this study also confirms the minor fracture of the posterior inter spinous ligament at the T10-11 level. Bilateral pleural effusions with dependent pulmonary atelectasis. Disc levels: No traumatic disc herniation or anterior encroachment. Ankylosis throughout the spine otherwise as shown by CT. IMPRESSION: 1. Interval spinal fusion with pedicle screws and posterior rods in place at T10, T11, T12 and L1. Components appear well positioned without complicating feature. 2. Continued mild distraction at the level of the T11-12 disc with fluid intensity material between the T11 vertebral body and the majority of the T11-12 disc. 3. I do think this study also confirms the minor fracture of the posterior inter spinous ligament at the T10-11 level. 4. Some blood products  in the posterior epidural fat extending from T7 into the lumbar region, but not causing visible compressive effect upon the cord. 5. Ankylosis throughout the spine otherwise as shown by CT. 6. Bilateral pleural effusions and dependent pulmonary atelectasis. Electronically Signed   By: Paulina Fusi M.D.   On: 05/28/2023 12:47   DG Lumbar Spine 2-3 Views Result Date: 05/27/2023 CLINICAL DATA:  Elective surgery. EXAM: LUMBAR SPINE - 2-3 VIEW COMPARISON:  Preoperative imaging FINDINGS: Two fluoroscopic spot views of the thoracolumbar spine obtained in the operating room. Posterior rod with intrapedicular screw fusion spanning the thoracolumbar junction. Widening of the T11-T12 disc space with is present on preoperative exam. Fluoroscopy time 7.9 seconds. Dose 7.45 mGy. IMPRESSION: Intraoperative fluoroscopy during thoracolumbar fusion. Electronically Signed   By: Narda Rutherford M.D.   On: 05/27/2023 19:02   DG O-ARM IMAGE ONLY/NO REPORT Result  Date: 05/27/2023 There is no Radiologist interpretation  for this exam.  DG C-Arm 1-60 Min-No Report Result Date: 05/27/2023 Fluoroscopy was utilized by the requesting physician.  No radiographic interpretation.   DG CHEST PORT 1 VIEW Result Date: 05/25/2023 CLINICAL DATA:  Hypoxia.  Fall yesterday. EXAM: PORTABLE CHEST 1 VIEW COMPARISON:  03/20/2023. FINDINGS: The heart is enlarged and the mediastinal contour stable. There is atherosclerotic calcification of the aorta. Lung volumes are low with mild atelectasis at the left lung base. No effusion or pneumothorax is seen. A pacemaker device is present over the left chest. A stable right chest port is noted. No acute fracture is seen. IMPRESSION: 1. Low lung volumes with mild atelectasis at the left lung base. 2. Cardiomegaly. Electronically Signed   By: Thornell Sartorius M.D.   On: 05/25/2023 00:09   CT Thoracic Spine Wo Contrast Result Date: 05/24/2023 CLINICAL DATA:  Larey Seat yesterday.  Acute back pain.  Unable to walk. EXAM: CT THORACIC SPINE WITHOUT CONTRAST TECHNIQUE: Multidetector CT images of the thoracic were obtained using the standard protocol without intravenous contrast. RADIATION DOSE REDUCTION: This exam was performed according to the departmental dose-optimization program which includes automated exposure control, adjustment of the mA and/or kV according to patient size and/or use of iterative reconstruction technique. COMPARISON:  Lumbar exam same day. FINDINGS: Alignment: Abnormal alignment at T11-12 as described above. Vertebrae: Chronic ankylosis of the spine consistent with ankylosing spondylitis. Acute Chance fracture affecting T11 and the T11-12 disc level with disruption of anterior and posterior tension bands, distraction and 2 mm of anterolisthesis of T11 relative to T12. I do not see definite bony compromise or hematoma compromise of the spinal canal, but consider MRI for better spinal canal evaluation. A0 spine fracture classification B1  with some B3 features. I also think there is a component of fracture of the posterior elements at the T10-11 level but do not see an anterior fracture or canal compromise at that level. Paraspinal and other soft tissues: Bilateral pleural effusions with dependent atelectasis. Disc levels: No other compressive narrowing of the canal or foramina because of the widespread ankylosis. IMPRESSION: 1. Acute distracted Chance fracture affecting T11 and the T11-12 disc level with disruption of anterior and posterior tension bands, distraction and 2 mm of anterolisthesis of T11 relative to T12. I do not see definite bony compromise or hematoma compromise of the spinal canal, but consider MRI for better spinal canal evaluation. A0 spine fracture classification B1 with some B3 features. 2. There does appear to be posterior element fractures at the T10-11 level affecting the ankylosed posterior interspinous ligament but I do not see  fracture of the laminae or anterior structures. 3. Chronic ankylosis of the spine consistent with ankylosing spondylitis. 4. Bilateral pleural effusions with dependent atelectasis. 5. Critical Value/emergent results were called by telephone at the time of interpretation on 05/24/2023 at 6:39 pm to provider [facility] , who verbally acknowledged these results. Electronically Signed   By: Paulina Fusi M.D.   On: 05/24/2023 18:39   CT Lumbar Spine Wo Contrast Result Date: 05/24/2023 CLINICAL DATA:  Larey Seat yesterday with acute back pain. Unable to stand or walk. EXAM: CT LUMBAR SPINE WITHOUT CONTRAST TECHNIQUE: Multidetector CT imaging of the lumbar spine was performed without intravenous contrast administration. Multiplanar CT image reconstructions were also generated. RADIATION DOSE REDUCTION: This exam was performed according to the departmental dose-optimization program which includes automated exposure control, adjustment of the mA and/or kV according to patient size and/or use of iterative  reconstruction technique. COMPARISON:  CT abdomen 03/20/2023 FINDINGS: Segmentation: 5 lumbar type vertebral bodies. Alignment: Normal Vertebrae: Chronic ankylosis of the sacroiliac joints and spine, probably secondary to ankylosing spondylitis. No fracture in the lumbar region. See results of thoracic exam. No evidence of sacral fracture. Paraspinal and other soft tissues: Aortic atherosclerosis. No aneurysm. Disc levels: No significant disc level pathology. No compressive stenosis of the canal or foramina. IMPRESSION: 1. No acute or traumatic finding. Chronic ankylosis of the sacroiliac joints and spine, probably secondary to ankylosing spondylitis. 2. Aortic atherosclerosis. Aortic Atherosclerosis (ICD10-I70.0). Electronically Signed   By: Paulina Fusi M.D.   On: 05/24/2023 18:32   DG Thoracic Spine 2 View Result Date: 05/24/2023 CLINICAL DATA:  Fall. EXAM: THORACIC SPINE 2 VIEWS COMPARISON:  Chest radiographs 09/08/2022. CT abdomen and pelvis 03/20/2023. FINDINGS: There is diffuse thoracic spine ankylosis. There is new slight anterolisthesis of a lower thoracic vertebral body with a suspected acute fracture extending through the intervening disc space and posterior elements with mild distraction. A Port-A-Cath, pacemaker, and cholecystectomy clips are noted. IMPRESSION: Ankylosing spondylitis with acute chalkstick fracture near the thoracolumbar junction. CT of the thoracic and lumbar spine is recommended for further evaluation. Electronically Signed   By: Sebastian Ache M.D.   On: 05/24/2023 16:40    DISCHARGE EXAMINATION: Vitals:   06/05/23 2300 06/06/23 0247 06/06/23 0713 06/06/23 0737  BP: 124/71 119/70 (!) 152/90   Pulse:    65  Resp:    14  Temp: (!) 96.6 F (35.9 C) (!) 97 F (36.1 C) 97.6 F (36.4 C)   TempSrc: Axillary Axillary Axillary   SpO2:   100% 100%  Weight:      Height:       General appearance: Awake alert.  In no distress Resp: Clear to auscultation bilaterally.  Normal  effort Cardio: S1-S2 is normal regular.  No S3-S4.  No rubs murmurs or bruit GI: Abdomen distended but soft.  Nontender.  Bowel sounds present. No obvious focal neurological deficits.  DISPOSITION: SNF  Discharge Instructions     Call MD for:  difficulty breathing, headache or visual disturbances   Complete by: As directed    Call MD for:  extreme fatigue   Complete by: As directed    Call MD for:  persistant dizziness or light-headedness   Complete by: As directed    Call MD for:  persistant nausea and vomiting   Complete by: As directed    Call MD for:  severe uncontrolled pain   Complete by: As directed    Call MD for:  temperature >100.4   Complete by: As directed  Diet - low sodium heart healthy   Complete by: As directed    Discharge instructions   Complete by: As directed    Please review instructions on the discharge summary.  You were cared for by a hospitalist during your hospital stay. If you have any questions about your discharge medications or the care you received while you were in the hospital after you are discharged, you can call the unit and asked to speak with the hospitalist on call if the hospitalist that took care of you is not available. Once you are discharged, your primary care physician will handle any further medical issues. Please note that NO REFILLS for any discharge medications will be authorized once you are discharged, as it is imperative that you return to your primary care physician (or establish a relationship with a primary care physician if you do not have one) for your aftercare needs so that they can reassess your need for medications and monitor your lab values. If you do not have a primary care physician, you can call [phone] for a physician referral.   Incentive spirometry RT   Complete by: As directed    Increase activity slowly   Complete by: As directed          Allergies as of 06/06/2023       Reactions   Adhesive [tape] Other  (See Comments)   Contact dermatitis   Sulfa Antibiotics Other (See Comments)   Unknown childhood reaction        Medication List     STOP taking these medications    furosemide 40 MG tablet Commonly known as: LASIX   potassium chloride 10 MEQ tablet Commonly known as: KLOR-CON       TAKE these medications    acetaminophen 325 MG tablet Commonly known as: TYLENOL Take 2 tablets (650 mg total) by mouth every 6 (six) hours as needed for headache.   albuterol 108 (90 Base) MCG/ACT inhaler Commonly known as: VENTOLIN HFA Inhale 2 puffs into the lungs every 6 (six) hours as needed for wheezing or shortness of breath.   alfuzosin 10 MG 24 hr tablet Commonly known as: UROXATRAL TAKE ONE TABLET DAILY WITH BREAKFAST   bisacodyl 10 MG suppository Commonly known as: DULCOLAX Place 1 suppository (10 mg total) rectally daily as needed for moderate constipation.   calcium carbonate 1250 (500 Ca) MG tablet Commonly known as: OS-CAL - dosed in mg of elemental calcium Take 1 tablet (1,250 mg total) by mouth 2 (two) times daily with a meal.   cyanocobalamin 1000 MCG tablet Commonly known as: VITAMIN B12 Take 1 tablet (1,000 mcg total) by mouth daily.   Eliquis 5 MG Tabs tablet Generic drug: apixaban Take 1 tablet (5 mg total) by mouth 2 (two) times daily. Resume from may 9   FeroSul 325 (65 Fe) MG tablet Generic drug: ferrous sulfate Take 1 tablet by mouth once daily with breakfast What changed:  how much to take when to take this   fludrocortisone 0.1 MG tablet Commonly known as: FLORINEF TAKE ONE TABLET ONCE DAILY WITH BREAKFAST   levothyroxine 100 MCG tablet Commonly known as: SYNTHROID TAKE ONE TABLET ONCE DAILY BEFORE BREAKFAST   magnesium oxide 400 (240 Mg) MG tablet Commonly known as: MAG-OX Take 400 mg by mouth daily.   methocarbamol 500 MG tablet Commonly known as: ROBAXIN Take 1 tablet (500 mg total) by mouth every 6 (six) hours as needed for muscle  spasms.   midodrine 5 MG tablet  Commonly known as: PROAMATINE Take 1 tablet (5 mg total) by mouth 2 (two) times daily with a meal.   omeprazole 10 MG capsule Commonly known as: PRILOSEC Take 20 mg by mouth daily.   oxyCODONE-acetaminophen 5-325 MG tablet Commonly known as: PERCOCET/ROXICET Take 1-2 tablets by mouth every 6 (six) hours as needed for moderate pain (pain score 4-6) or severe pain (pain score 7-10).   polyethylene glycol 17 g packet Commonly known as: MIRALAX / GLYCOLAX Take 17 g by mouth 2 (two) times daily.   predniSONE 10 MG tablet Commonly known as: DELTASONE TAKE ONE TABLET ONCE DAILY WITH BREAKFAST   pregabalin 200 MG capsule Commonly known as: LYRICA Take 1 capsule (200 mg total) by mouth 2 (two) times daily.   rosuvastatin 5 MG tablet Commonly known as: CRESTOR Take 1 tablet by mouth at bedtime.   senna-docusate 8.6-50 MG tablet Commonly known as: Senokot-S Take 2 tablets by mouth 2 (two) times daily.   valACYclovir 1000 MG tablet Commonly known as: VALTREX Take 1,000 mg by mouth daily.   Vitamin D (Ergocalciferol) 1.25 MG (50000 UNIT) Caps capsule Commonly known as: DRISDOL Take 1 capsule (50,000 Units total) by mouth every 7 (seven) days. Start taking on: June 08, 2023          Contact information for follow-up providers     Barnett Abu, MD. Schedule an appointment as soon as possible for a visit in 3 week(s).   Specialty: Neurosurgery Contact information: [address] [place] [address] [phone]              Contact information for after-discharge care     Destination     [facility] .   Service: Skilled Nursing Contact information: [address] [place] [address] [phone]                     TOTAL DISCHARGE TIME: 35 minutes  Chidiebere Wynn Rito Ehrlich  [facility] Pager on [email or web address]  06/06/2023, 11:17 AM

## 2023-06-06 NOTE — Plan of Care (Signed)
Problem: Health Behavior/Discharge Planning: Goal: Ability to manage health-related needs will improve Outcome: Progressing   Problem: Clinical Measurements: Goal: Ability to maintain clinical measurements within normal limits will improve Outcome: Progressing Goal: Respiratory complications will improve Outcome: Progressing Goal: Cardiovascular complication will be avoided Outcome: Progressing

## 2023-06-07 ENCOUNTER — Other Ambulatory Visit: Payer: Self-pay | Admitting: Adult Health

## 2023-06-07 ENCOUNTER — Encounter: Payer: Self-pay | Admitting: Adult Health

## 2023-06-07 ENCOUNTER — Non-Acute Institutional Stay (SKILLED_NURSING_FACILITY): Payer: 59 | Admitting: Adult Health

## 2023-06-07 DIAGNOSIS — K2100 Gastro-esophageal reflux disease with esophagitis, without bleeding: Secondary | ICD-10-CM

## 2023-06-07 DIAGNOSIS — E2740 Unspecified adrenocortical insufficiency: Secondary | ICD-10-CM

## 2023-06-07 DIAGNOSIS — E782 Mixed hyperlipidemia: Secondary | ICD-10-CM

## 2023-06-07 DIAGNOSIS — K5909 Other constipation: Secondary | ICD-10-CM

## 2023-06-07 DIAGNOSIS — R350 Frequency of micturition: Secondary | ICD-10-CM

## 2023-06-07 DIAGNOSIS — G6289 Other specified polyneuropathies: Secondary | ICD-10-CM

## 2023-06-07 DIAGNOSIS — I110 Hypertensive heart disease with heart failure: Secondary | ICD-10-CM

## 2023-06-07 DIAGNOSIS — I4821 Permanent atrial fibrillation: Secondary | ICD-10-CM

## 2023-06-07 DIAGNOSIS — C8510 Unspecified B-cell lymphoma, unspecified site: Secondary | ICD-10-CM

## 2023-06-07 DIAGNOSIS — E538 Deficiency of other specified B group vitamins: Secondary | ICD-10-CM

## 2023-06-07 DIAGNOSIS — I959 Hypotension, unspecified: Secondary | ICD-10-CM

## 2023-06-07 DIAGNOSIS — D696 Thrombocytopenia, unspecified: Secondary | ICD-10-CM

## 2023-06-07 DIAGNOSIS — I700 Atherosclerosis of aorta: Secondary | ICD-10-CM

## 2023-06-07 DIAGNOSIS — E039 Hypothyroidism, unspecified: Secondary | ICD-10-CM

## 2023-06-07 DIAGNOSIS — S22088D Other fracture of T11-T12 vertebra, subsequent encounter for fracture with routine healing: Secondary | ICD-10-CM

## 2023-06-07 DIAGNOSIS — N401 Enlarged prostate with lower urinary tract symptoms: Secondary | ICD-10-CM

## 2023-06-07 DIAGNOSIS — R82998 Other abnormal findings in urine: Secondary | ICD-10-CM

## 2023-06-07 MED ORDER — OXYCODONE-ACETAMINOPHEN 5-325 MG PO TABS: 1.0000 | ORAL_TABLET | Freq: Four times a day (QID) | ORAL | 0 refills | Status: AC | PRN

## 2023-06-07 MED ORDER — PREGABALIN 200 MG PO CAPS: 200.0000 mg | ORAL_CAPSULE | Freq: Two times a day (BID) | ORAL | 0 refills | Status: DC

## 2023-06-07 NOTE — Progress Notes (Unsigned)
Location:  [facility] Room Number: 124 Place of Service:  SNF ([id number])   CODE STATUS: full   Allergies  Allergen Reactions  . Adhesive [Tape] Other (See Comments)    Contact dermatitis  . Sulfa Antibiotics Other (See Comments)    Unknown childhood reaction    Chief Complaint  Patient presents with  . Hospitalization Follow-up    HPI:  He is a 73 year old man who has been hospitalized from 05-24-23 through 06-06-23. His past medical history includes: primary adrenal insufficiency (on chronic glucocorticoid/mineralocorticoid) ankylosing spondylitis. CHB may 2024 is status post pace maker; PAF: hypertension; stage IV: diffuse large B cell lymphoma (DLBCL) is status post R-CHOP has no residual disease on last imaging. He presented with back status post fall while attempting to get in his truck. He fell backwards onto the ground. Initially he had muscle spasms on both flanks; without pain in his mid back. He was able to walk around after his fall.  Unable T11-12 chance fracture, 2 mm anterolisthesis, additional  fracture posterior elements of T10-11.  which he was seen by neurosurgery. On 05-27-23 he underwent posterior spinal fixation by Dr. Danielle Dess. He was started on calcium and vitamin D.  He had developed a post op constipation/ileus: revealed by KUB. He was started on a bowel regimen; but did require NG tube. The tube has been removed. He is having bowel movements and is tolerating po. Acute hypoxic respiratory failure: the CT noted pleural effusion and bibasilar atelectasis. Most likely due to hypoventilation secondary to pain from his vertebral fracture. He would benefit from a sleep study on an outpatient basis.  He is here for short term rehab with his goal to return back home. He will continue to be followed for his chronic illnesses including: ***    Past Medical History:  Diagnosis Date  . Atrial fibrillation (HCC)   . Cancer (HCC)    Diffuse Large B-Cell  lymphoma  . GERD (gastroesophageal reflux disease)   . High cholesterol   . Hypertension   . Obesity     Past Surgical History:  Procedure Laterality Date  . APPENDECTOMY    . CATARACT EXTRACTION Bilateral 2022  . CHOLECYSTECTOMY    . ESOPHAGEAL DILATION     multiple times  . IR IMAGING GUIDED PORT INSERTION  03/31/2021  . JOINT REPLACEMENT Left    hip  . LUMBAR PERCUTANEOUS PEDICLE SCREW 3 LEVEL  05/27/2023   Procedure: POSTERIOR FIXATION AND FUSION OF THORACIC ELEVEN-THORACIC TWELVE FRACTURE DISLOCATION. FIXATION FROM THORACIC TEN-THORACIC ELEVEN METHACRYLATE AUGMENTATION WITH FLUOROSCOPIC GUIDANCE;  Surgeon: Barnett Abu, MD;  Location: [facility] OR;  Service: Neurosurgery;;  . PACEMAKER IMPLANT N/A 09/07/2022   Procedure: PACEMAKER IMPLANT;  Surgeon: Regan Lemming, MD;  Location: [facility] INVASIVE CV LAB;  Service: Cardiovascular;  Laterality: N/A;    Social History   Socioeconomic History  . Marital status: Widowed    Spouse name: Not on file  . Number of children: 3  . Years of education: Not on file  . Highest education level: Not on file  Occupational History  . Occupation: Retired  Tobacco Use  . Smoking status: Never    Passive exposure: Never  . Smokeless tobacco: Never  Vaping Use  . Vaping status: Never Used  Substance and Sexual Activity  . Alcohol use: No  . Drug use: No  . Sexual activity: Not Currently  Other Topics Concern  . Not on file  Social History Narrative  .  Not on file   Social Drivers of Health   Financial Resource Strain: Low Risk  (05/15/2023)   Received from [facility]   Overall Financial Resource Strain (CARDIA)   . Difficulty of Paying Living Expenses: Not very hard  Food Insecurity: No Food Insecurity (05/25/2023)   Hunger Vital Sign   . Worried About Programme researcher, broadcasting/film/video in the Last Year: Never true   . Ran Out of Food in the Last Year: Never true  Transportation Needs: No Transportation Needs (05/25/2023)   PRAPARE - Transportation    . Lack of Transportation (Medical): No   . Lack of Transportation (Non-Medical): No  Physical Activity: Unknown (04/25/2023)   Received from [facility]   Exercise Vital Sign   . Days of Exercise per Week: 0 days   . Minutes of Exercise per Session: Not on file  Recent Concern: Physical Activity - Inactive (04/25/2023)   Received from [facility]   Exercise Vital Sign   . Days of Exercise per Week: 0 days   . Minutes of Exercise per Session: 60 min  Stress: No Stress Concern Present (04/25/2023)   Received from [facility] - Occupational Stress Questionnaire   . Feeling of Stress : Not at all  Social Connections: Moderately Isolated (05/25/2023)   Social Connection and Isolation Panel [NHANES]   . Frequency of Communication with Friends and Family: Three times a week   . Frequency of Social Gatherings with Friends and Family: Once a week   . Attends Religious Services: More than 4 times per year   . Active Member of Clubs or Organizations: No   . Attends Banker Meetings: Not on file   . Marital Status: Widowed  Intimate Partner Violence: Not At Risk (05/25/2023)   Humiliation, Afraid, Rape, and Kick questionnaire   . Fear of Current or Ex-Partner: No   . Emotionally Abused: No   . Physically Abused: No   . Sexually Abused: No   Family History  Problem Relation Age of Onset  . Heart failure Father   . Heart attack Father        Deceased   . Thyroid disease Sister       VITAL SIGNS BP 110/66   Pulse 65   Temp (!) 97.2 F (36.2 C)   Resp 20   Ht 6' (1.829 m)   Wt 298 lb (135.2 kg)   SpO2 98%   BMI 40.42 kg/m   Outpatient Encounter Medications as of 06/07/2023  Medication Sig  . acetaminophen (TYLENOL) 325 MG tablet Take 2 tablets (650 mg total) by mouth every 6 (six) hours as needed for headache.  . albuterol (VENTOLIN HFA) 108 (90 Base) MCG/ACT inhaler Inhale 2 puffs into the lungs every 6 (six) hours  as needed for wheezing or shortness of breath.  . alfuzosin (UROXATRAL) 10 MG 24 hr tablet TAKE ONE TABLET DAILY WITH BREAKFAST  . bisacodyl (DULCOLAX) 10 MG suppository Place 1 suppository (10 mg total) rectally daily as needed for moderate constipation.  . calcium carbonate (OS-CAL - DOSED IN MG OF ELEMENTAL CALCIUM) 1250 (500 Ca) MG tablet Take 1 tablet (1,250 mg total) by mouth 2 (two) times daily with a meal.  . ELIQUIS 5 MG TABS tablet Take 1 tablet (5 mg total) by mouth 2 (two) times daily. Resume from may 9  . FEROSUL 325 (65 Fe) MG tablet Take 1 tablet by mouth once daily with breakfast (Patient taking  differently: Take 325 mg by mouth every other day.)  . fludrocortisone (FLORINEF) 0.1 MG tablet TAKE ONE TABLET ONCE DAILY WITH BREAKFAST  . levothyroxine (SYNTHROID) 100 MCG tablet TAKE ONE TABLET ONCE DAILY BEFORE BREAKFAST  . magnesium oxide (MAG-OX) 400 (240 Mg) MG tablet Take 400 mg by mouth daily.  . methocarbamol (ROBAXIN) 500 MG tablet Take 1 tablet (500 mg total) by mouth every 6 (six) hours as needed for muscle spasms.  . midodrine (PROAMATINE) 5 MG tablet Take 1 tablet (5 mg total) by mouth 2 (two) times daily with a meal.  . omeprazole (PRILOSEC) 10 MG capsule Take 20 mg by mouth daily.  Marland Kitchen oxyCODONE-acetaminophen (PERCOCET/ROXICET) 5-325 MG tablet Take 1 tablet by mouth every 6 (six) hours as needed for up to 6 days for moderate pain (pain score 4-6) or severe pain (pain score 7-10).  . polyethylene glycol (MIRALAX / GLYCOLAX) 17 g packet Take 17 g by mouth 2 (two) times daily.  . predniSONE (DELTASONE) 10 MG tablet TAKE ONE TABLET ONCE DAILY WITH BREAKFAST  . pregabalin (LYRICA) 200 MG capsule Take 1 capsule (200 mg total) by mouth 2 (two) times daily.  . rosuvastatin (CRESTOR) 5 MG tablet Take 1 tablet by mouth at bedtime.  . senna-docusate (SENOKOT-S) 8.6-50 MG tablet Take 2 tablets by mouth 2 (two) times daily.  . valACYclovir (VALTREX) 1000 MG tablet Take 1,000 mg by mouth  daily.  . vitamin B-12 (CYANOCOBALAMIN) 1000 MCG tablet Take 1 tablet (1,000 mcg total) by mouth daily.  Melene Muller ON 06/08/2023] Vitamin D, Ergocalciferol, (DRISDOL) 1.25 MG (50000 UNIT) CAPS capsule Take 1 capsule (50,000 Units total) by mouth every 7 (seven) days.   Facility-Administered Encounter Medications as of 06/07/2023  Medication  . sodium chloride flush (NS) 0.9 % injection 10 mL     SIGNIFICANT DIAGNOSTIC EXAMS  TODAY  05-24-23: wbc 11.1; hgb 15.6; hct 48.6; mcv 96.2 plt 187; glucose 138; bun 16; creat 0.69; k+ 4.3; na++ 13 ;ca 9.2 gfr >60; protein 6.8 albumin 3.6 total bili 4.1 06-06-23: wbc 8.5; hgb 14.3; hct 42.4; mcv 91.2 plt 273; glucose 83; bun 5; creat 0.56; k+ 3.8; na++ 133; ca 8.9; hfg >60  Review of Systems  Constitutional:  Negative for malaise/fatigue.  Respiratory:  Negative for cough and shortness of breath.   Cardiovascular:  Negative for chest pain, palpitations and leg swelling.  Gastrointestinal:  Negative for abdominal pain, constipation and heartburn.  Musculoskeletal:  Negative for back pain, joint pain and myalgias.  Skin: Negative.   Neurological:  Negative for dizziness.  Psychiatric/Behavioral:  The patient is not nervous/anxious.    Physical Exam Constitutional:      General: He is not in acute distress.    Appearance: He is well-developed. He is morbidly obese. He is not diaphoretic.  Neck:     Thyroid: No thyromegaly.  Cardiovascular:     Rate and Rhythm: Normal rate and regular rhythm.     Heart sounds: Normal heart sounds.  Pulmonary:     Effort: Pulmonary effort is normal. No respiratory distress.     Breath sounds: Normal breath sounds.  Abdominal:     General: Bowel sounds are normal. There is no distension.     Palpations: Abdomen is soft.     Tenderness: There is no abdominal tenderness.  Musculoskeletal:        General: Normal range of motion.     Cervical back: Neck supple.     Right lower leg: Edema present.  Lymphadenopathy:  Cervical: No cervical adenopathy.  Skin:    General: Skin is warm and dry.  Neurological:     Mental Status: He is alert and oriented to person, place, and time.  Psychiatric:        Mood and Affect: Mood normal.      ASSESSMENT/ PLAN:  TODAY  Permanent atrial fibrillation: heart rate is stable; will continue eliquis 5 mg twice daily   2. Benign hypertension with coincident congestive heart failure: b/p 110/66: will continue to monitor his status.   3. Adrenal insuffiencey: will continue florinef 0.1 mg daily; prednisone 10 mg daily   4. Acquired hypothyroidism: will continue levothyroxine 100 mcg daily   5. Other closed fracture of eleventh thoracic vertebrae with routine healing subsequent encounter: will continue therapy as directed to improve upon his level of independence with his adls. Will continue percocet 5/325 mg every 6 hours as needed  6.     ***  Synthia Innocent NP [facility]   call [phone]

## 2023-06-10 ENCOUNTER — Encounter: Payer: Self-pay | Admitting: Internal Medicine

## 2023-06-10 ENCOUNTER — Non-Acute Institutional Stay (SKILLED_NURSING_FACILITY): Payer: 59 | Admitting: Internal Medicine

## 2023-06-10 DIAGNOSIS — G629 Polyneuropathy, unspecified: Secondary | ICD-10-CM | POA: Insufficient documentation

## 2023-06-10 DIAGNOSIS — E2740 Unspecified adrenocortical insufficiency: Secondary | ICD-10-CM | POA: Diagnosis not present

## 2023-06-10 DIAGNOSIS — S22088D Other fracture of T11-T12 vertebra, subsequent encounter for fracture with routine healing: Secondary | ICD-10-CM

## 2023-06-10 DIAGNOSIS — E538 Deficiency of other specified B group vitamins: Secondary | ICD-10-CM | POA: Insufficient documentation

## 2023-06-10 DIAGNOSIS — E039 Hypothyroidism, unspecified: Secondary | ICD-10-CM

## 2023-06-10 DIAGNOSIS — I10 Essential (primary) hypertension: Secondary | ICD-10-CM

## 2023-06-10 DIAGNOSIS — I4891 Unspecified atrial fibrillation: Secondary | ICD-10-CM

## 2023-06-10 DIAGNOSIS — I110 Hypertensive heart disease with heart failure: Secondary | ICD-10-CM | POA: Insufficient documentation

## 2023-06-10 DIAGNOSIS — I700 Atherosclerosis of aorta: Secondary | ICD-10-CM | POA: Insufficient documentation

## 2023-06-10 DIAGNOSIS — K5909 Other constipation: Secondary | ICD-10-CM | POA: Insufficient documentation

## 2023-06-10 DIAGNOSIS — R82998 Other abnormal findings in urine: Secondary | ICD-10-CM | POA: Insufficient documentation

## 2023-06-10 NOTE — Assessment & Plan Note (Signed)
BP controlled; without antihypertensive medications.  In fact he is on no driving at this time 5 mg twice daily.  Continue to monitor and wean the midodrine when blood pressure is stable.

## 2023-06-10 NOTE — Patient Instructions (Signed)
 See assessment and plan under each diagnosis in the problem list and acutely for this visit

## 2023-06-10 NOTE — Assessment & Plan Note (Signed)
Rhythm is clinically paced at 63.  Continue Eliquis prophylaxis.

## 2023-06-10 NOTE — Assessment & Plan Note (Signed)
PT/OT at SNF as tolerated.  Neurosurgical follow-up as scheduled.
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
## 2023-06-10 NOTE — Assessment & Plan Note (Signed)
Most recent TSH was therapeutic at 1.87 no on 04/11/2023.  No change in L-thyroxine dose.

## 2023-06-10 NOTE — Assessment & Plan Note (Signed)
Continue supplementation with prednisone and mineralocorticoid.  Monitor for any signs of adrenal insufficiency exacerbation such as hypotension.

## 2023-06-12 ENCOUNTER — Encounter: Payer: Self-pay | Admitting: Adult Health

## 2023-06-12 NOTE — Progress Notes (Signed)
 Location:  [facility]   Place of Service:      Allergies  Allergen Reactions   Adhesive [Tape] Other (See Comments)    Contact dermatitis   Sulfa Antibiotics Other (See Comments)    Unknown childhood reaction    Chief Complaint  Patient presents with   Acute Visit    Pain management    Error    HPI:    Past Medical History:  Diagnosis Date   Atrial fibrillation (HCC)    Cancer (HCC)    Diffuse Large B-Cell lymphoma   GERD (gastroesophageal reflux disease)    High cholesterol    Hypertension    Obesity     Past Surgical History:  Procedure Laterality Date   APPENDECTOMY     CATARACT EXTRACTION Bilateral 2022   CHOLECYSTECTOMY     ESOPHAGEAL DILATION     multiple times   IR IMAGING GUIDED PORT INSERTION  03/31/2021   JOINT REPLACEMENT Left    hip   LUMBAR PERCUTANEOUS PEDICLE SCREW 3 LEVEL  05/27/2023   Procedure: POSTERIOR FIXATION AND FUSION OF THORACIC ELEVEN-THORACIC TWELVE FRACTURE DISLOCATION. FIXATION FROM THORACIC TEN-THORACIC ELEVEN METHACRYLATE AUGMENTATION WITH FLUOROSCOPIC GUIDANCE;  Surgeon: Colon Shove, MD;  Location: [facility] OR;  Service: Neurosurgery;;   PACEMAKER IMPLANT N/A 09/07/2022   Procedure: PACEMAKER IMPLANT;  Surgeon: Inocencio Soyla Lunger, MD;  Location: [facility] INVASIVE CV LAB;  Service: Cardiovascular;  Laterality: N/A;    Social History   Socioeconomic History   Marital status: Widowed    Spouse name: Not on file   Number of children: 3   Years of education: Not on file   Highest education level: Not on file  Occupational History   Occupation: Retired  Tobacco Use   Smoking status: Never    Passive exposure: Never   Smokeless tobacco: Never  Vaping Use   Vaping status: Never Used  Substance and Sexual Activity   Alcohol use: No   Drug use: No   Sexual activity: Not Currently  Other Topics Concern   Not on file  Social History Narrative   Not on file   Social Drivers of Health   Financial Resource Strain: Low  Risk  (05/15/2023)   Received from [facility]   Overall Financial Resource Strain (CARDIA)    Difficulty of Paying Living Expenses: Not very hard  Food Insecurity: No Food Insecurity (05/25/2023)   Hunger Vital Sign    Worried About Running Out of Food in the Last Year: Never true    Ran Out of Food in the Last Year: Never true  Transportation Needs: No Transportation Needs (05/25/2023)   PRAPARE - Administrator, Civil Service (Medical): No    Lack of Transportation (Non-Medical): No  Physical Activity: Unknown (04/25/2023)   Received from [facility]   Exercise Vital Sign    Days of Exercise per Week: 0 days    Minutes of Exercise per Session: Not on file  Recent Concern: Physical Activity - Inactive (04/25/2023)   Received from [facility]   Exercise Vital Sign    Days of Exercise per Week: 0 days    Minutes of Exercise per Session: 60 min  Stress: No Stress Concern Present (04/25/2023)   Received from [facility] Occupational Health - Occupational Stress Questionnaire    Feeling of Stress : Not at all  Social Connections: Moderately Isolated (05/25/2023)   Social Connection and Isolation Panel [NHANES]    Frequency of  Communication with Friends and Family: Three times a week    Frequency of Social Gatherings with Friends and Family: Once a week    Attends Religious Services: More than 4 times per year    Active Member of [employer] or Organizations: No    Attends Banker Meetings: Not on file    Marital Status: Widowed  Intimate Partner Violence: Not At Risk (05/25/2023)   Humiliation, Afraid, Rape, and Kick questionnaire    Fear of Current or Ex-Partner: No    Emotionally Abused: No    Physically Abused: No    Sexually Abused: No   Family History  Problem Relation Age of Onset   Heart failure Father    Heart attack Father        Deceased    Thyroid  disease Sister       VITAL SIGNS BP 136/70   Pulse 72   Temp 98.4  F (36.9 C)   Resp 20   Ht 6' (1.829 m)   Wt 294 lb 8 oz (133.6 kg)   SpO2 97%   BMI 39.94 kg/m   Outpatient Encounter Medications as of 06/12/2023  Medication Sig   meloxicam  (MOBIC ) 7.5 MG tablet Take 7.5 mg by mouth daily.   acetaminophen  (TYLENOL ) 325 MG tablet Take 2 tablets (650 mg total) by mouth every 6 (six) hours as needed for headache.   albuterol  (VENTOLIN  HFA) 108 (90 Base) MCG/ACT inhaler Inhale 2 puffs into the lungs every 6 (six) hours as needed for wheezing or shortness of breath.   alfuzosin  (UROXATRAL ) 10 MG 24 hr tablet TAKE ONE TABLET DAILY WITH BREAKFAST   bisacodyl  (DULCOLAX) 10 MG suppository Place 1 suppository (10 mg total) rectally daily as needed for moderate constipation.   calcium  carbonate (OS-CAL - DOSED IN MG OF ELEMENTAL CALCIUM ) 1250 (500 Ca) MG tablet Take 1 tablet (1,250 mg total) by mouth 2 (two) times daily with a meal.   ELIQUIS  5 MG TABS tablet Take 1 tablet (5 mg total) by mouth 2 (two) times daily. Resume from may 9   FEROSUL 325 (65 Fe) MG tablet Take 1 tablet by mouth once daily with breakfast (Patient taking differently: Take 325 mg by mouth every other day.)   fludrocortisone  (FLORINEF ) 0.1 MG tablet TAKE ONE TABLET ONCE DAILY WITH BREAKFAST   levothyroxine  (SYNTHROID ) 100 MCG tablet TAKE ONE TABLET ONCE DAILY BEFORE BREAKFAST   magnesium  oxide (MAG-OX) 400 (240 Mg) MG tablet Take 400 mg by mouth daily.   methocarbamol  (ROBAXIN ) 500 MG tablet Take 1 tablet (500 mg total) by mouth every 6 (six) hours as needed for muscle spasms.   midodrine  (PROAMATINE ) 5 MG tablet Take 1 tablet (5 mg total) by mouth 2 (two) times daily with a meal.   omeprazole  (PRILOSEC ) 10 MG capsule Take 20 mg by mouth daily.   [EXPIRED] oxyCODONE -acetaminophen  (PERCOCET/ROXICET) 5-325 MG tablet Take 1 tablet by mouth every 6 (six) hours as needed for up to 6 days for moderate pain (pain score 4-6) or severe pain (pain score 7-10).   polyethylene glycol (MIRALAX  / GLYCOLAX ) 17  g packet Take 17 g by mouth 2 (two) times daily.   predniSONE  (DELTASONE ) 10 MG tablet TAKE ONE TABLET ONCE DAILY WITH BREAKFAST   rosuvastatin  (CRESTOR ) 5 MG tablet Take 1 tablet by mouth at bedtime.   senna-docusate (SENOKOT-S) 8.6-50 MG tablet Take 2 tablets by mouth 2 (two) times daily.   valACYclovir  (VALTREX ) 1000 MG tablet Take 1,000 mg by mouth daily.  vitamin B-12 (CYANOCOBALAMIN ) 1000 MCG tablet Take 1 tablet (1,000 mcg total) by mouth daily.   Vitamin D , Ergocalciferol , (DRISDOL ) 1.25 MG (50000 UNIT) CAPS capsule Take 1 capsule (50,000 Units total) by mouth every 7 (seven) days.   [DISCONTINUED] pregabalin  (LYRICA ) 200 MG capsule Take 1 capsule (200 mg total) by mouth 2 (two) times daily.   Facility-Administered Encounter Medications as of 06/12/2023  Medication   sodium chloride  flush (NS) 0.9 % injection 10 mL     SIGNIFICANT DIAGNOSTIC EXAMS       ASSESSMENT/ PLAN:     Barnie Seip NP [facility] Adult [facility]  call [phone]   This encounter was created in error - please disregard.

## 2023-06-14 ENCOUNTER — Other Ambulatory Visit: Payer: Self-pay | Admitting: Adult Health

## 2023-06-14 MED ORDER — PREGABALIN 200 MG PO CAPS: 200.0000 mg | ORAL_CAPSULE | Freq: Two times a day (BID) | ORAL | 0 refills | Status: DC

## 2023-06-24 ENCOUNTER — Encounter: Payer: Self-pay | Admitting: Adult Health

## 2023-06-24 ENCOUNTER — Ambulatory Visit (INDEPENDENT_AMBULATORY_CARE_PROVIDER_SITE_OTHER): Payer: 59

## 2023-06-24 ENCOUNTER — Non-Acute Institutional Stay (SKILLED_NURSING_FACILITY): Payer: 59 | Admitting: Adult Health

## 2023-06-24 ENCOUNTER — Other Ambulatory Visit: Payer: Self-pay | Admitting: Adult Health

## 2023-06-24 DIAGNOSIS — E2740 Unspecified adrenocortical insufficiency: Secondary | ICD-10-CM

## 2023-06-24 DIAGNOSIS — I442 Atrioventricular block, complete: Secondary | ICD-10-CM

## 2023-06-24 DIAGNOSIS — R339 Retention of urine, unspecified: Secondary | ICD-10-CM

## 2023-06-24 DIAGNOSIS — N400 Enlarged prostate without lower urinary tract symptoms: Secondary | ICD-10-CM

## 2023-06-24 DIAGNOSIS — S22088D Other fracture of T11-T12 vertebra, subsequent encounter for fracture with routine healing: Secondary | ICD-10-CM | POA: Diagnosis not present

## 2023-06-24 DIAGNOSIS — I110 Hypertensive heart disease with heart failure: Secondary | ICD-10-CM

## 2023-06-24 DIAGNOSIS — I4821 Permanent atrial fibrillation: Secondary | ICD-10-CM

## 2023-06-24 DIAGNOSIS — I700 Atherosclerosis of aorta: Secondary | ICD-10-CM

## 2023-06-24 MED ORDER — MAGNESIUM OXIDE -MG SUPPLEMENT 400 (240 MG) MG PO TABS
400.0000 mg | ORAL_TABLET | Freq: Every day | ORAL | 0 refills | Status: AC
Start: 2023-06-24 — End: ?

## 2023-06-24 MED ORDER — PREDNISONE 10 MG PO TABS: 10.0000 mg | ORAL_TABLET | Freq: Every day | ORAL | 0 refills | Status: DC

## 2023-06-24 MED ORDER — METHOCARBAMOL 500 MG PO TABS: 500.0000 mg | ORAL_TABLET | Freq: Four times a day (QID) | ORAL | 0 refills | Status: AC | PRN

## 2023-06-24 MED ORDER — MIDODRINE HCL 5 MG PO TABS: 5.0000 mg | ORAL_TABLET | Freq: Two times a day (BID) | ORAL | 0 refills | Status: DC

## 2023-06-24 MED ORDER — APIXABAN 5 MG PO TABS: 5.0000 mg | ORAL_TABLET | Freq: Two times a day (BID) | ORAL | 0 refills | Status: DC

## 2023-06-24 MED ORDER — FERROUS SULFATE 325 (65 FE) MG PO TABS: 325.0000 mg | ORAL_TABLET | Freq: Every day | ORAL | 0 refills | Status: AC

## 2023-06-24 MED ORDER — VALACYCLOVIR HCL 1 G PO TABS
1000.0000 mg | ORAL_TABLET | Freq: Every day | ORAL | 0 refills | Status: AC
Start: 2023-06-24 — End: ?

## 2023-06-24 MED ORDER — LEVOTHYROXINE SODIUM 100 MCG PO TABS: 100.0000 ug | ORAL_TABLET | Freq: Every day | ORAL | 0 refills | Status: AC

## 2023-06-24 MED ORDER — FLUDROCORTISONE ACETATE 0.1 MG PO TABS: 0.1000 mg | ORAL_TABLET | Freq: Every day | ORAL | 0 refills | Status: DC

## 2023-06-24 MED ORDER — ALFUZOSIN HCL ER 10 MG PO TB24
ORAL_TABLET | ORAL | 0 refills | Status: DC
Start: 2023-06-24 — End: 2023-08-08

## 2023-06-24 MED ORDER — PREGABALIN 200 MG PO CAPS: 200.0000 mg | ORAL_CAPSULE | Freq: Two times a day (BID) | ORAL | 0 refills | Status: DC

## 2023-06-24 MED ORDER — VITAMIN D (ERGOCALCIFEROL) 1.25 MG (50000 UNIT) PO CAPS
50000.0000 [IU] | ORAL_CAPSULE | ORAL | 0 refills | Status: AC
Start: 2023-06-24 — End: ?

## 2023-06-24 MED ORDER — MELOXICAM 7.5 MG PO TABS: 7.5000 mg | ORAL_TABLET | Freq: Every day | ORAL | 0 refills | Status: DC

## 2023-06-24 MED ORDER — ROSUVASTATIN CALCIUM 5 MG PO TABS: 5.0000 mg | ORAL_TABLET | Freq: Every day | ORAL | 0 refills | Status: AC

## 2023-06-24 MED ORDER — ALBUTEROL SULFATE HFA 108 (90 BASE) MCG/ACT IN AERS: 2.0000 | INHALATION_SPRAY | Freq: Four times a day (QID) | RESPIRATORY_TRACT | 0 refills | Status: DC | PRN

## 2023-06-24 NOTE — Progress Notes (Unsigned)
Location:  [facility] Room Number: 124 Place of Service:  SNF (31)   CODE STATUS: full   Allergies  Allergen Reactions   Adhesive [Tape] Other (See Comments)    Contact dermatitis   Sulfa Antibiotics Other (See Comments)    Unknown childhood reaction    Chief Complaint  Patient presents with   Discharge Note    HPI:  He is being discharged to home with home health for pt/ot. He will not need any dme. He will need his prescriptions written and will need to follow up with his medical provider. He had been hospitalized for a thoracic spine fracture status post surgical repair. He did have a postop ileus and acute respiratory failure. He was admitted to this facility for short term rehab. Therapy: ambulate 70 feet with rolling walker and contact guard. Upper body supervision; lower body min assist; brp: contact guard.   Past Medical History:  Diagnosis Date   Atrial fibrillation (HCC)    Cancer (HCC)    Diffuse Large B-Cell lymphoma   GERD (gastroesophageal reflux disease)    High cholesterol    Hypertension    Obesity     Past Surgical History:  Procedure Laterality Date   APPENDECTOMY     CATARACT EXTRACTION Bilateral 2022   CHOLECYSTECTOMY     ESOPHAGEAL DILATION     multiple times   IR IMAGING GUIDED PORT INSERTION  03/31/2021   JOINT REPLACEMENT Left    hip   LUMBAR PERCUTANEOUS PEDICLE SCREW 3 LEVEL  05/27/2023   Procedure: POSTERIOR FIXATION AND FUSION OF THORACIC ELEVEN-THORACIC TWELVE FRACTURE DISLOCATION. FIXATION FROM THORACIC TEN-THORACIC ELEVEN METHACRYLATE AUGMENTATION WITH FLUOROSCOPIC GUIDANCE;  Surgeon: Barnett Abu, MD;  Location: [facility] OR;  Service: Neurosurgery;;   PACEMAKER IMPLANT N/A 09/07/2022   Procedure: PACEMAKER IMPLANT;  Surgeon: Regan Lemming, MD;  Location: [facility] INVASIVE CV LAB;  Service: Cardiovascular;  Laterality: N/A;    Social History   Socioeconomic History   Marital status: Widowed    Spouse name: Not  on file   Number of children: 3   Years of education: Not on file   Highest education level: Not on file  Occupational History   Occupation: Retired  Tobacco Use   Smoking status: Never    Passive exposure: Never   Smokeless tobacco: Never  Vaping Use   Vaping status: Never Used  Substance and Sexual Activity   Alcohol use: No   Drug use: No   Sexual activity: Not Currently  Other Topics Concern   Not on file  Social History Narrative   Not on file   Social Drivers of Health   Financial Resource Strain: Low Risk  (05/15/2023)   Received from [facility]   Overall Financial Resource Strain (CARDIA)    Difficulty of Paying Living Expenses: Not very hard  Food Insecurity: No Food Insecurity (05/25/2023)   Hunger Vital Sign    Worried About Running Out of Food in the Last Year: Never true    Ran Out of Food in the Last Year: Never true  Transportation Needs: No Transportation Needs (05/25/2023)   PRAPARE - Administrator, Civil Service (Medical): No    Lack of Transportation (Non-Medical): No  Physical Activity: Unknown (04/25/2023)   Received from [facility]   Exercise Vital Sign    Days of Exercise per Week: 0 days    Minutes of Exercise per Session: Not on file  Recent Concern: Physical Activity - Inactive (  04/25/2023)   Received from [facility]   Exercise Vital Sign    Days of Exercise per Week: 0 days    Minutes of Exercise per Session: 60 min  Stress: No Stress Concern Present (04/25/2023)   Received from [facility] of Occupational Health - Occupational Stress Questionnaire    Feeling of Stress : Not at all  Social Connections: Moderately Isolated (05/25/2023)   Social Connection and Isolation Panel [NHANES]    Frequency of Communication with Friends and Family: Three times a week    Frequency of Social Gatherings with Friends and Family: Once a week    Attends Religious Services: More than 4 times per year    Active Member  of [employer] or Organizations: No    Attends Banker Meetings: Not on file    Marital Status: Widowed  Intimate Partner Violence: Not At Risk (05/25/2023)   Humiliation, Afraid, Rape, and Kick questionnaire    Fear of Current or Ex-Partner: No    Emotionally Abused: No    Physically Abused: No    Sexually Abused: No   Family History  Problem Relation Age of Onset   Heart failure Father    Heart attack Father        Deceased    Thyroid disease Sister       VITAL SIGNS BP 132/88   Pulse 81   Temp 97.8 F (36.6 C)   Resp 20   Ht 6' (1.829 m)   Wt 295 lb 1.6 oz (133.9 kg)   SpO2 98%   BMI 40.02 kg/m   Outpatient Encounter Medications as of 06/24/2023  Medication Sig   ferrous sulfate 325 (65 FE) MG tablet Take 325 mg by mouth daily with breakfast.   [DISCONTINUED] FEROSUL 325 (65 Fe) MG tablet Take 1 tablet by mouth once daily with breakfast (Patient taking differently: Take 325 mg by mouth daily with breakfast.)   acetaminophen (TYLENOL) 325 MG tablet Take 2 tablets (650 mg total) by mouth every 6 (six) hours as needed for headache.   albuterol (VENTOLIN HFA) 108 (90 Base) MCG/ACT inhaler Inhale 2 puffs into the lungs every 6 (six) hours as needed for wheezing or shortness of breath.   alfuzosin (UROXATRAL) 10 MG 24 hr tablet TAKE ONE TABLET DAILY WITH BREAKFAST   bisacodyl (DULCOLAX) 10 MG suppository Place 1 suppository (10 mg total) rectally daily as needed for moderate constipation.   calcium carbonate (OS-CAL - DOSED IN MG OF ELEMENTAL CALCIUM) 1250 (500 Ca) MG tablet Take 1 tablet (1,250 mg total) by mouth 2 (two) times daily with a meal.   ELIQUIS 5 MG TABS tablet Take 1 tablet (5 mg total) by mouth 2 (two) times daily. Resume from may 9   fludrocortisone (FLORINEF) 0.1 MG tablet TAKE ONE TABLET ONCE DAILY WITH BREAKFAST   levothyroxine (SYNTHROID) 100 MCG tablet TAKE ONE TABLET ONCE DAILY BEFORE BREAKFAST   magnesium oxide (MAG-OX) 400 (240 Mg) MG tablet Take  400 mg by mouth daily.   meloxicam (MOBIC) 7.5 MG tablet Take 7.5 mg by mouth daily.   methocarbamol (ROBAXIN) 500 MG tablet Take 1 tablet (500 mg total) by mouth every 6 (six) hours as needed for muscle spasms.   midodrine (PROAMATINE) 5 MG tablet Take 1 tablet (5 mg total) by mouth 2 (two) times daily with a meal.   omeprazole (PRILOSEC) 10 MG capsule Take 20 mg by mouth daily.   polyethylene glycol (MIRALAX / GLYCOLAX) 17 g packet  Take 17 g by mouth 2 (two) times daily.   predniSONE (DELTASONE) 10 MG tablet TAKE ONE TABLET ONCE DAILY WITH BREAKFAST   pregabalin (LYRICA) 200 MG capsule Take 1 capsule (200 mg total) by mouth 2 (two) times daily.   rosuvastatin (CRESTOR) 5 MG tablet Take 1 tablet by mouth at bedtime.   senna-docusate (SENOKOT-S) 8.6-50 MG tablet Take 2 tablets by mouth 2 (two) times daily.   valACYclovir (VALTREX) 1000 MG tablet Take 1,000 mg by mouth daily.   vitamin B-12 (CYANOCOBALAMIN) 1000 MCG tablet Take 1 tablet (1,000 mcg total) by mouth daily.   Vitamin D, Ergocalciferol, (DRISDOL) 1.25 MG (50000 UNIT) CAPS capsule Take 1 capsule (50,000 Units total) by mouth every 7 (seven) days.   Facility-Administered Encounter Medications as of 06/24/2023  Medication   sodium chloride flush (NS) 0.9 % injection 10 mL     SIGNIFICANT DIAGNOSTIC EXAMS  TODAY  05-24-23: wbc 11.1; hgb 15.6; hct 48.6; mcv 96.2 plt 187; glucose 138; bun 16; creat 0.69; k+ 4.3; na++ 13 ;ca 9.2 gfr >60; protein 6.8 albumin 3.6 total bili 4.1 06-06-23: wbc 8.5; hgb 14.3; hct 42.4; mcv 91.2 plt 273; glucose 83; bun 5; creat 0.56; k+ 3.8; na++ 133; ca 8.9; hfg >60  Review of Systems  Constitutional:  Negative for malaise/fatigue.  Respiratory:  Negative for cough and shortness of breath.   Cardiovascular:  Negative for chest pain, palpitations and leg swelling.  Gastrointestinal:  Negative for abdominal pain, constipation and heartburn.  Musculoskeletal:  Negative for back pain, joint pain and  myalgias.  Skin: Negative.   Neurological:  Negative for dizziness.  Psychiatric/Behavioral:  The patient is not nervous/anxious.    Physical Exam Constitutional:      General: He is not in acute distress.    Appearance: He is well-developed. He is morbidly obese. He is not diaphoretic.  Neck:     Thyroid: No thyromegaly.  Cardiovascular:     Rate and Rhythm: Normal rate and regular rhythm.     Pulses: Normal pulses.     Heart sounds: Normal heart sounds.  Pulmonary:     Effort: Pulmonary effort is normal. No respiratory distress.     Breath sounds: Normal breath sounds.  Abdominal:     General: Bowel sounds are normal. There is no distension.     Palpations: Abdomen is soft.     Tenderness: There is no abdominal tenderness.  Musculoskeletal:        General: Normal range of motion.     Cervical back: Neck supple.     Right lower leg: Edema present.     Left lower leg: Edema present.  Lymphadenopathy:     Cervical: No cervical adenopathy.  Skin:    General: Skin is warm and dry.  Neurological:     Mental Status: He is alert and oriented to person, place, and time.  Psychiatric:        Mood and Affect: Mood normal.       ASSESSMENT/ PLAN:   Patient is being discharged with the following home health services:  pt/ot/rn to evaluate and treat as indicated for gait balance strength adl training. Medication management   Patient is being discharged with the following durable medical equipment:  none needed   Patient has been advised to f/u with their PCP in 1-2 weeks to for a transitions of care visit.  Social services at their facility was responsible for arranging this appointment.  Pt was provided with adequate prescriptions of noncontrolled  medications to reach the scheduled appointment .  For controlled substances, a limited supply was provided as appropriate for the individual patient.  If the pt normally receives these medications from a pain clinic or has a contract  with another physician, these medications should be received from that clinic or physician only).    A 30 day supply of his prescription medications have been sent to [facility] pharmacy  Time spent with patient: 40 minutes: medications; dme; home health    Synthia Innocent NP [facility] Adult Medicine   call [phone]

## 2023-06-25 ENCOUNTER — Encounter (HOSPITAL_COMMUNITY): Payer: Self-pay

## 2023-06-25 ENCOUNTER — Inpatient Hospital Stay (HOSPITAL_COMMUNITY)
Admission: EM | Admit: 2023-06-25 | Discharge: 2023-06-29 | DRG: 291 | Disposition: A | Discharge status: Home Health | Payer: 59 | Source: Skilled Nursing Facility | Attending: Family Medicine | Admitting: Family Medicine

## 2023-06-25 ENCOUNTER — Emergency Department (HOSPITAL_COMMUNITY): Payer: 59

## 2023-06-25 ENCOUNTER — Other Ambulatory Visit: Payer: Self-pay

## 2023-06-25 DIAGNOSIS — E2740 Unspecified adrenocortical insufficiency: Secondary | ICD-10-CM | POA: Diagnosis present

## 2023-06-25 DIAGNOSIS — K921 Melena: Secondary | ICD-10-CM | POA: Diagnosis present

## 2023-06-25 DIAGNOSIS — I4821 Permanent atrial fibrillation: Secondary | ICD-10-CM

## 2023-06-25 DIAGNOSIS — Z9049 Acquired absence of other specified parts of digestive tract: Secondary | ICD-10-CM

## 2023-06-25 DIAGNOSIS — J90 Pleural effusion, not elsewhere classified: Secondary | ICD-10-CM

## 2023-06-25 DIAGNOSIS — I447 Left bundle-branch block, unspecified: Secondary | ICD-10-CM | POA: Diagnosis present

## 2023-06-25 DIAGNOSIS — I10 Essential (primary) hypertension: Secondary | ICD-10-CM

## 2023-06-25 DIAGNOSIS — N400 Enlarged prostate without lower urinary tract symptoms: Secondary | ICD-10-CM | POA: Diagnosis present

## 2023-06-25 DIAGNOSIS — E162 Hypoglycemia, unspecified: Secondary | ICD-10-CM | POA: Diagnosis not present

## 2023-06-25 DIAGNOSIS — I110 Hypertensive heart disease with heart failure: Principal | ICD-10-CM | POA: Diagnosis present

## 2023-06-25 DIAGNOSIS — Z7901 Long term (current) use of anticoagulants: Secondary | ICD-10-CM

## 2023-06-25 DIAGNOSIS — E039 Hypothyroidism, unspecified: Secondary | ICD-10-CM | POA: Diagnosis present

## 2023-06-25 DIAGNOSIS — R1310 Dysphagia, unspecified: Secondary | ICD-10-CM | POA: Diagnosis not present

## 2023-06-25 DIAGNOSIS — Z950 Presence of cardiac pacemaker: Secondary | ICD-10-CM

## 2023-06-25 DIAGNOSIS — I4891 Unspecified atrial fibrillation: Secondary | ICD-10-CM | POA: Diagnosis present

## 2023-06-25 DIAGNOSIS — E876 Hypokalemia: Secondary | ICD-10-CM

## 2023-06-25 DIAGNOSIS — J9601 Acute respiratory failure with hypoxia: Principal | ICD-10-CM

## 2023-06-25 DIAGNOSIS — Z8349 Family history of other endocrine, nutritional and metabolic diseases: Secondary | ICD-10-CM

## 2023-06-25 DIAGNOSIS — Z7952 Long term (current) use of systemic steroids: Secondary | ICD-10-CM | POA: Diagnosis not present

## 2023-06-25 DIAGNOSIS — Z96642 Presence of left artificial hip joint: Secondary | ICD-10-CM | POA: Diagnosis present

## 2023-06-25 DIAGNOSIS — K922 Gastrointestinal hemorrhage, unspecified: Secondary | ICD-10-CM | POA: Diagnosis present

## 2023-06-25 DIAGNOSIS — E7800 Pure hypercholesterolemia, unspecified: Secondary | ICD-10-CM | POA: Diagnosis present

## 2023-06-25 DIAGNOSIS — E66812 Obesity, class 2: Secondary | ICD-10-CM | POA: Diagnosis present

## 2023-06-25 DIAGNOSIS — Z6839 Body mass index (BMI) 39.0-39.9, adult: Secondary | ICD-10-CM | POA: Diagnosis not present

## 2023-06-25 DIAGNOSIS — Z1152 Encounter for screening for COVID-19: Secondary | ICD-10-CM | POA: Diagnosis not present

## 2023-06-25 DIAGNOSIS — I350 Nonrheumatic aortic (valve) stenosis: Secondary | ICD-10-CM | POA: Diagnosis present

## 2023-06-25 DIAGNOSIS — Z79890 Hormone replacement therapy (postmenopausal): Secondary | ICD-10-CM

## 2023-06-25 DIAGNOSIS — Z79899 Other long term (current) drug therapy: Secondary | ICD-10-CM

## 2023-06-25 DIAGNOSIS — R1314 Dysphagia, pharyngoesophageal phase: Secondary | ICD-10-CM | POA: Diagnosis present

## 2023-06-25 DIAGNOSIS — G629 Polyneuropathy, unspecified: Secondary | ICD-10-CM | POA: Diagnosis present

## 2023-06-25 DIAGNOSIS — K3189 Other diseases of stomach and duodenum: Secondary | ICD-10-CM | POA: Diagnosis not present

## 2023-06-25 DIAGNOSIS — I959 Hypotension, unspecified: Secondary | ICD-10-CM | POA: Diagnosis not present

## 2023-06-25 DIAGNOSIS — I499 Cardiac arrhythmia, unspecified: Secondary | ICD-10-CM | POA: Diagnosis present

## 2023-06-25 DIAGNOSIS — I5023 Acute on chronic systolic (congestive) heart failure: Secondary | ICD-10-CM | POA: Diagnosis not present

## 2023-06-25 DIAGNOSIS — I509 Heart failure, unspecified: Principal | ICD-10-CM

## 2023-06-25 DIAGNOSIS — E785 Hyperlipidemia, unspecified: Secondary | ICD-10-CM | POA: Diagnosis not present

## 2023-06-25 DIAGNOSIS — I5033 Acute on chronic diastolic (congestive) heart failure: Secondary | ICD-10-CM

## 2023-06-25 DIAGNOSIS — Z8249 Family history of ischemic heart disease and other diseases of the circulatory system: Secondary | ICD-10-CM

## 2023-06-25 DIAGNOSIS — Z8572 Personal history of non-Hodgkin lymphomas: Secondary | ICD-10-CM

## 2023-06-25 DIAGNOSIS — K219 Gastro-esophageal reflux disease without esophagitis: Secondary | ICD-10-CM | POA: Diagnosis present

## 2023-06-25 DIAGNOSIS — R0602 Shortness of breath: Secondary | ICD-10-CM | POA: Diagnosis present

## 2023-06-25 DIAGNOSIS — J9811 Atelectasis: Secondary | ICD-10-CM | POA: Diagnosis not present

## 2023-06-25 DIAGNOSIS — Z791 Long term (current) use of non-steroidal anti-inflammatories (NSAID): Secondary | ICD-10-CM

## 2023-06-25 HISTORY — DX: Presence of cardiac pacemaker: Z95.0

## 2023-06-25 LAB — CUP PACEART REMOTE DEVICE CHECK
Battery Remaining Longevity: 136 mo
Battery Voltage: 3.07 V
Brady Statistic RA Percent Paced: 0 %
Brady Statistic RV Percent Paced: 98.12 %
Date Time Interrogation Session: 20250217163429
Implantable Lead Connection Status: 753985
Implantable Lead Connection Status: 753985
Implantable Lead Implant Date: 20240503
Implantable Lead Implant Date: 20240503
Implantable Lead Location: 753859
Implantable Lead Location: 753860
Implantable Lead Model: 3830
Implantable Lead Model: 5076
Implantable Pulse Generator Implant Date: 20240503
Lead Channel Impedance Value: 361 Ohm
Lead Channel Impedance Value: 380 Ohm
Lead Channel Impedance Value: 494 Ohm
Lead Channel Impedance Value: 551 Ohm
Lead Channel Pacing Threshold Amplitude: 0.75 V
Lead Channel Pacing Threshold Pulse Width: 0.4 ms
Lead Channel Sensing Intrinsic Amplitude: 1 mV
Lead Channel Sensing Intrinsic Amplitude: 1 mV
Lead Channel Sensing Intrinsic Amplitude: 18.625 mV
Lead Channel Sensing Intrinsic Amplitude: 18.625 mV
Lead Channel Setting Pacing Amplitude: 2.5 V
Lead Channel Setting Pacing Amplitude: 3.5 V
Lead Channel Setting Pacing Pulse Width: 0.4 ms
Lead Channel Setting Sensing Sensitivity: 2.8 mV
Zone Setting Status: 755011
Zone Setting Status: 755011

## 2023-06-25 LAB — COMPREHENSIVE METABOLIC PANEL
ALT: 20 U/L (ref 0–44)
AST: 22 U/L (ref 15–41)
Albumin: 3.3 g/dL — ABNORMAL LOW (ref 3.5–5.0)
Alkaline Phosphatase: 149 U/L — ABNORMAL HIGH (ref 38–126)
Anion gap: 8 (ref 5–15)
BUN: 14 mg/dL (ref 8–23)
CO2: 33 mmol/L — ABNORMAL HIGH (ref 22–32)
Calcium: 8.6 mg/dL — ABNORMAL LOW (ref 8.9–10.3)
Chloride: 95 mmol/L — ABNORMAL LOW (ref 98–111)
Creatinine, Ser: 0.55 mg/dL — ABNORMAL LOW (ref 0.61–1.24)
GFR, Estimated: >60 mL/min (ref >60)
Glucose, Bld: 96 mg/dL (ref 70–99)
Potassium: 3 mmol/L — ABNORMAL LOW (ref 3.5–5.1)
Sodium: 136 mmol/L (ref 135–145)
Total Bilirubin: 1.5 mg/dL — ABNORMAL HIGH (ref 0.0–1.2)
Total Protein: 5.7 g/dL — ABNORMAL LOW (ref 6.5–8.1)

## 2023-06-25 LAB — CBC WITH DIFFERENTIAL/PLATELET
Abs Immature Granulocytes: 0.03 10*3/uL (ref 0.00–0.07)
Basophils Absolute: 0 10*3/uL (ref 0.0–0.1)
Basophils Relative: 1 %
Eosinophils Absolute: 0.2 10*3/uL (ref 0.0–0.5)
Eosinophils Relative: 2 %
HCT: 45.3 % (ref 39.0–52.0)
Hemoglobin: 14.2 g/dL (ref 13.0–17.0)
Immature Granulocytes: 0 %
Lymphocytes Relative: 16 %
Lymphs Abs: 1.4 10*3/uL (ref 0.7–4.0)
MCH: 31.2 pg (ref 26.0–34.0)
MCHC: 31.3 g/dL (ref 30.0–36.0)
MCV: 99.6 fL (ref 80.0–100.0)
Monocytes Absolute: 0.5 10*3/uL (ref 0.1–1.0)
Monocytes Relative: 6 %
Neutro Abs: 6.5 10*3/uL (ref 1.7–7.7)
Neutrophils Relative %: 75 %
Platelets: 222 10*3/uL (ref 150–400)
RBC: 4.55 MIL/uL (ref 4.22–5.81)
RDW: 15.4 % (ref 11.5–15.5)
WBC: 8.6 10*3/uL (ref 4.0–10.5)
nRBC: 0 % (ref 0.0–0.2)

## 2023-06-25 LAB — BLOOD GAS, VENOUS
Acid-Base Excess: 15.4 mmol/L — ABNORMAL HIGH (ref 0.0–2.0)
Bicarbonate: 44.4 mmol/L — ABNORMAL HIGH (ref 20.0–28.0)
Drawn by: 44828
O2 Saturation: 52.2 %
Patient temperature: 37.1
pCO2, Ven: 75 mm[Hg] (ref 44–60)
pH, Ven: 7.38 (ref 7.25–7.43)
pO2, Ven: 32 mm[Hg] (ref 32–45)

## 2023-06-25 LAB — RESP PANEL BY RT-PCR (RSV, FLU A&B, COVID)  RVPGX2
Influenza A by PCR: NEGATIVE
Influenza B by PCR: NEGATIVE
Resp Syncytial Virus by PCR: NEGATIVE
SARS Coronavirus 2 by RT PCR: NEGATIVE

## 2023-06-25 LAB — TROPONIN I (HIGH SENSITIVITY)
Troponin I (High Sensitivity): 32 ng/L — ABNORMAL HIGH (ref <18)
Troponin I (High Sensitivity): 36 ng/L — ABNORMAL HIGH (ref <18)

## 2023-06-25 LAB — BRAIN NATRIURETIC PEPTIDE: B Natriuretic Peptide: 461 pg/mL — ABNORMAL HIGH (ref 0.0–100.0)

## 2023-06-25 LAB — HEMOGLOBIN AND HEMATOCRIT, BLOOD
HCT: 45.8 % (ref 39.0–52.0)
Hemoglobin: 14.7 g/dL (ref 13.0–17.0)

## 2023-06-25 LAB — TSH: TSH: 6.886 u[IU]/mL — ABNORMAL HIGH (ref 0.350–4.500)

## 2023-06-25 LAB — PROTIME-INR
INR: 1.2 (ref 0.8–1.2)
Prothrombin Time: 15.9 s — ABNORMAL HIGH (ref 11.4–15.2)

## 2023-06-25 LAB — MAGNESIUM: Magnesium: 1.9 mg/dL (ref 1.7–2.4)

## 2023-06-25 MED ORDER — POTASSIUM CHLORIDE CRYS ER 20 MEQ PO TBCR
40.0000 meq | EXTENDED_RELEASE_TABLET | Freq: Once | ORAL | Status: AC
  Administered 2023-06-25: 40 meq via ORAL
  Filled 2023-06-25: qty 2

## 2023-06-25 MED ORDER — FLUDROCORTISONE ACETATE 0.1 MG PO TABS
0.1000 mg | ORAL_TABLET | Freq: Every day | ORAL | Status: DC
  Administered 2023-06-26: 0.1 mg via ORAL
  Filled 2023-06-25 (×3): qty 1

## 2023-06-25 MED ORDER — OXYMETAZOLINE HCL 0.05 % NA SOLN
1.0000 | Freq: Once | NASAL | Status: AC
  Administered 2023-06-25: 1 via NASAL
  Filled 2023-06-25: qty 30

## 2023-06-25 MED ORDER — FUROSEMIDE 10 MG/ML IJ SOLN: 40.0000 mg | Freq: Two times a day (BID) | INTRAMUSCULAR | Status: DC

## 2023-06-25 MED ORDER — ACETAMINOPHEN 325 MG PO TABS: 650.0000 mg | ORAL_TABLET | Freq: Four times a day (QID) | ORAL | Status: DC | PRN

## 2023-06-25 MED ORDER — SODIUM CHLORIDE 0.9 % IV SOLN
500.0000 mg | Freq: Once | INTRAVENOUS | Status: AC
  Administered 2023-06-25: 500 mg via INTRAVENOUS
  Filled 2023-06-25: qty 5

## 2023-06-25 MED ORDER — ALFUZOSIN HCL ER 10 MG PO TB24
10.0000 mg | ORAL_TABLET | Freq: Every day | ORAL | Status: DC
  Administered 2023-06-26: 10 mg via ORAL
  Filled 2023-06-25: qty 1

## 2023-06-25 MED ORDER — PANTOPRAZOLE SODIUM 40 MG IV SOLR
40.0000 mg | Freq: Two times a day (BID) | INTRAVENOUS | Status: DC
  Administered 2023-06-25 – 2023-06-29 (×8): 40 mg via INTRAVENOUS
  Filled 2023-06-25 (×8): qty 10

## 2023-06-25 MED ORDER — PREDNISONE 10 MG PO TABS
10.0000 mg | ORAL_TABLET | Freq: Every day | ORAL | Status: DC
Start: 2023-06-26 — End: 2023-06-26
  Administered 2023-06-26: 10 mg via ORAL
  Filled 2023-06-25: qty 1

## 2023-06-25 MED ORDER — VALACYCLOVIR HCL 500 MG PO TABS
1000.0000 mg | ORAL_TABLET | Freq: Every day | ORAL | Status: DC
  Administered 2023-06-25 – 2023-06-29 (×5): 1000 mg via ORAL
  Filled 2023-06-25 (×8): qty 2

## 2023-06-25 MED ORDER — PREGABALIN 75 MG PO CAPS
200.0000 mg | ORAL_CAPSULE | Freq: Two times a day (BID) | ORAL | Status: DC
  Administered 2023-06-25 – 2023-06-29 (×7): 200 mg via ORAL
  Filled 2023-06-25: qty 2
  Filled 2023-06-25: qty 1
  Filled 2023-06-25: qty 2
  Filled 2023-06-25: qty 1
  Filled 2023-06-25: qty 2
  Filled 2023-06-25 (×2): qty 1

## 2023-06-25 MED ORDER — POLYETHYLENE GLYCOL 3350 17 G PO PACK: 17.0000 g | PACK | Freq: Every day | ORAL | Status: DC | PRN

## 2023-06-25 MED ORDER — FUROSEMIDE 10 MG/ML IJ SOLN
40.0000 mg | Freq: Once | INTRAMUSCULAR | Status: AC
  Administered 2023-06-25: 40 mg via INTRAVENOUS
  Filled 2023-06-25: qty 4

## 2023-06-25 MED ORDER — POTASSIUM CHLORIDE CRYS ER 20 MEQ PO TBCR
40.0000 meq | EXTENDED_RELEASE_TABLET | Freq: Once | ORAL | Status: AC
Start: 2023-06-25 — End: 2023-06-25
  Administered 2023-06-25: 40 meq via ORAL
  Filled 2023-06-25: qty 2

## 2023-06-25 MED ORDER — SENNOSIDES-DOCUSATE SODIUM 8.6-50 MG PO TABS
2.0000 | ORAL_TABLET | Freq: Two times a day (BID) | ORAL | Status: DC
  Administered 2023-06-27 – 2023-06-29 (×5): 2 via ORAL
  Filled 2023-06-25 (×7): qty 2

## 2023-06-25 MED ORDER — SODIUM CHLORIDE 0.9 % IV SOLN
1.0000 g | Freq: Once | INTRAVENOUS | Status: AC
  Administered 2023-06-25: 1 g via INTRAVENOUS
  Filled 2023-06-25: qty 10

## 2023-06-25 MED ORDER — ROSUVASTATIN CALCIUM 10 MG PO TABS
5.0000 mg | ORAL_TABLET | Freq: Every day | ORAL | Status: DC
  Administered 2023-06-25 – 2023-06-28 (×3): 5 mg via ORAL
  Filled 2023-06-25 (×3): qty 1

## 2023-06-25 MED ORDER — MIDODRINE HCL 5 MG PO TABS
5.0000 mg | ORAL_TABLET | Freq: Two times a day (BID) | ORAL | Status: DC
  Administered 2023-06-25 – 2023-06-26 (×3): 5 mg via ORAL
  Filled 2023-06-25 (×3): qty 1

## 2023-06-25 MED ORDER — IOHEXOL 350 MG/ML SOLN
75.0000 mL | Freq: Once | INTRAVENOUS | Status: AC | PRN
Start: 2023-06-25 — End: 2023-06-25
  Administered 2023-06-25: 75 mL via INTRAVENOUS

## 2023-06-25 MED ORDER — LEVOTHYROXINE SODIUM 100 MCG PO TABS
100.0000 ug | ORAL_TABLET | Freq: Every day | ORAL | Status: DC
  Administered 2023-06-26 – 2023-06-29 (×3): 100 ug via ORAL
  Filled 2023-06-25: qty 2
  Filled 2023-06-25 (×3): qty 1

## 2023-06-25 MED ORDER — FENTANYL CITRATE PF 50 MCG/ML IJ SOSY
50.0000 ug | PREFILLED_SYRINGE | Freq: Once | INTRAMUSCULAR | Status: AC
  Administered 2023-06-25: 50 ug via INTRAVENOUS
  Filled 2023-06-25: qty 1

## 2023-06-25 MED ORDER — APIXABAN 5 MG PO TABS: 5.0000 mg | ORAL_TABLET | Freq: Two times a day (BID) | ORAL | Status: DC

## 2023-06-25 MED ORDER — ACETAMINOPHEN 650 MG RE SUPP: 650.0000 mg | Freq: Four times a day (QID) | RECTAL | Status: DC | PRN

## 2023-06-25 NOTE — ED Notes (Signed)
Pt had a large amount of black tarry stool, occult positive. Dr. Mariea Clonts notified, orders received.

## 2023-06-25 NOTE — Assessment & Plan Note (Signed)
Potassium 3.  Mag 1.9. - Replete

## 2023-06-25 NOTE — Assessment & Plan Note (Signed)
Stable. -Resume midodrine, Florinef

## 2023-06-25 NOTE — H&P (Addendum)
History and Physical    ELIA KEENUM GNF:[id number] DOB: July 13, 1950 DOA: 06/25/2023  PCP: Rebecka Apley, NP  Patient coming from: Home  I have personally briefly reviewed patient's old medical records in [facility]  Chief Complaint: Nose Bleed, shortness of breath  HPI: Roy [name] is a 73 y.o. male with medical history significant for atrial fibrillation on chronic anticoagulation with Eliquis, adrenal insufficiency, CHF, EMS was called for for nosebleeds that started spontaneously.  Bleeding continued for about 30 minutes until EMS got there applied pressure and Afrin.  O2 sats were checked and he was down to 79 to 80% on room air.  He denies difficulty breathing, orthopnea.  He denies cough.  No chest pain.  He denies difficulty breathing.  Denies lower extremity swelling or abdominal bloating.  Recent hospitalization 1/17 to 1/30-patient had unstable T11-12 chance fracture, 2 mm anterolisthesis, requiring transfer from [facility] to [facility] and subsequent posterior spinal fixation by neurosurgeon Dr. Danielle Dess 1/20.  Stay was complicated by constipation/ileus.  Also acute hypoxic respiratory failure secondary to pain and immobility.  He was to follow-up for outpatient sleep study, and possibly PFTs to evaluate for restrictive lung disease, also rheumatology to evaluate ankylosing spondylitis seen on imaging. Patient was discharged to rehab, he was discharged home yesterday.  ED Course: Tmax 98.  Heart rate 62-86.  Respiratory rate 17-26.  Systolic blood pressure 103-141.  O2 sats down to 80% on room air, placed on nonrebreather initially and then weaned down to 3 L sats currently greater than 96%.  Potassium 3.  BNP elevated at 461.  VBG with pH of 7.3, pCO2 of 75. Portable chest x-ray suggested bibasilar pneumonia, subsequent CTA chest-Motion artifacts, negative for PE, shows moderate bilateral pleural effusions are increased from prior imaging. Started on antibiotics for  initial concerns for pneumonia on chest x-ray. IV 40 mg Lasix given.  Review of Systems: As per HPI all other systems reviewed and negative.  Past Medical History:  Diagnosis Date   Atrial fibrillation (HCC)    Cancer (HCC)    Diffuse Large B-Cell lymphoma   GERD (gastroesophageal reflux disease)    High cholesterol    Hypertension    Obesity     Past Surgical History:  Procedure Laterality Date   APPENDECTOMY     CATARACT EXTRACTION Bilateral 2022   CHOLECYSTECTOMY     ESOPHAGEAL DILATION     multiple times   IR IMAGING GUIDED PORT INSERTION  03/31/2021   JOINT REPLACEMENT Left    hip   LUMBAR PERCUTANEOUS PEDICLE SCREW 3 LEVEL  05/27/2023   Procedure: POSTERIOR FIXATION AND FUSION OF THORACIC ELEVEN-THORACIC TWELVE FRACTURE DISLOCATION. FIXATION FROM THORACIC TEN-THORACIC ELEVEN METHACRYLATE AUGMENTATION WITH FLUOROSCOPIC GUIDANCE;  Surgeon: Barnett Abu, MD;  Location: [facility] OR;  Service: Neurosurgery;;   PACEMAKER IMPLANT N/A 09/07/2022   Procedure: PACEMAKER IMPLANT;  Surgeon: Regan Lemming, MD;  Location: [facility] INVASIVE CV LAB;  Service: Cardiovascular;  Laterality: N/A;     reports that he has never smoked. He has never been exposed to tobacco smoke. He has never used smokeless tobacco. He reports that he does not drink alcohol and does not use drugs.  Allergies  Allergen Reactions   Adhesive [Tape] Other (See Comments)    Contact dermatitis   Sulfa Antibiotics Other (See Comments)    Unknown childhood reaction    Family History  Problem Relation Age of Onset   Heart failure Father    Heart attack Father  Deceased    Thyroid disease Sister     Prior to Admission medications   Medication Sig Start Date End Date Taking? Authorizing Provider  acetaminophen (TYLENOL) 325 MG tablet Take 2 tablets (650 mg total) by mouth every 6 (six) hours as needed for headache. 01/13/21   Mariea Clonts, Courage, MD  albuterol (VENTOLIN HFA) 108 (90 Base) MCG/ACT inhaler Inhale 2  puffs into the lungs every 6 (six) hours as needed for wheezing or shortness of breath. 06/24/23   Sharee Holster, NP  alfuzosin (UROXATRAL) 10 MG 24 hr tablet TAKE ONE TABLET DAILY WITH BREAKFAST 06/24/23   Sharee Holster, NP  apixaban (ELIQUIS) 5 MG TABS tablet Take 1 tablet (5 mg total) by mouth 2 (two) times daily. 06/24/23   Sharee Holster, NP  bisacodyl (DULCOLAX) 10 MG suppository Place 1 suppository (10 mg total) rectally daily as needed for moderate constipation. 06/06/23   Osvaldo Shipper, MD  calcium carbonate (OS-CAL - DOSED IN MG OF ELEMENTAL CALCIUM) 1250 (500 Ca) MG tablet Take 1 tablet (1,250 mg total) by mouth 2 (two) times daily with a meal. 06/06/23   Osvaldo Shipper, MD  ferrous sulfate 325 (65 FE) MG tablet Take 1 tablet (325 mg total) by mouth daily with breakfast. 06/24/23   Sharee Holster, NP  fludrocortisone (FLORINEF) 0.1 MG tablet Take 1 tablet (0.1 mg total) by mouth daily. 06/24/23   Sharee Holster, NP  levothyroxine (SYNTHROID) 100 MCG tablet Take 1 tablet (100 mcg total) by mouth daily before breakfast. 06/24/23   Sharee Holster, NP  magnesium oxide (MAG-OX) 400 (240 Mg) MG tablet Take 1 tablet (400 mg total) by mouth daily. 06/24/23   Sharee Holster, NP  meloxicam (MOBIC) 7.5 MG tablet Take 1 tablet (7.5 mg total) by mouth daily. 06/24/23   Sharee Holster, NP  methocarbamol (ROBAXIN) 500 MG tablet Take 1 tablet (500 mg total) by mouth every 6 (six) hours as needed for muscle spasms. 06/24/23   Sharee Holster, NP  midodrine (PROAMATINE) 5 MG tablet Take 1 tablet (5 mg total) by mouth 2 (two) times daily with a meal. 06/24/23   Chilton Si, Chong Sicilian, NP  omeprazole (PRILOSEC) 10 MG capsule Take 20 mg by mouth daily. 10/26/21   [provider]  polyethylene glycol (MIRALAX / GLYCOLAX) 17 g packet Take 17 g by mouth 2 (two) times daily. 06/06/23   Osvaldo Shipper, MD  predniSONE (DELTASONE) 10 MG tablet Take 1 tablet (10 mg total) by mouth daily with breakfast.  06/24/23   Sharee Holster, NP  pregabalin (LYRICA) 200 MG capsule Take 1 capsule (200 mg total) by mouth 2 (two) times daily. 06/24/23   Sharee Holster, NP  rosuvastatin (CRESTOR) 5 MG tablet Take 1 tablet (5 mg total) by mouth at bedtime. 06/24/23   Sharee Holster, NP  senna-docusate (SENOKOT-S) 8.6-50 MG tablet Take 2 tablets by mouth 2 (two) times daily. 06/06/23   Osvaldo Shipper, MD  valACYclovir (VALTREX) 1000 MG tablet Take 1 tablet (1,000 mg total) by mouth daily. 06/24/23   Sharee Holster, NP  vitamin B-12 (CYANOCOBALAMIN) 1000 MCG tablet Take 1 tablet (1,000 mcg total) by mouth daily. 02/28/21   Carnella Guadalajara, PA-C  Vitamin D, Ergocalciferol, (DRISDOL) 1.25 MG (50000 UNIT) CAPS capsule Take 1 capsule (50,000 Units total) by mouth every 7 (seven) days. 06/24/23   Sharee Holster, NP    Physical Exam: Vitals:   06/25/23 1300 06/25/23 1315 06/25/23 1333  06/25/23 1400  BP:    103/87  Pulse: 73 86  70  Resp: 20 19  (!) 26  Temp:   98 F (36.7 C)   TempSrc:   Oral   SpO2: 99% 97%  98%  Weight:      Height:        Constitutional: NAD, calm, comfortable Vitals:   06/25/23 1300 06/25/23 1315 06/25/23 1333 06/25/23 1400  BP:    103/87  Pulse: 73 86  70  Resp: 20 19  (!) 26  Temp:   98 F (36.7 C)   TempSrc:   Oral   SpO2: 99% 97%  98%  Weight:      Height:       Eyes: PERRL, slight ptosis to left eye, per patient his baseline.  Otherwise lids and conjunctivae normal ENMT: Mucous membranes are dry Neck: normal, supple, no masses, no thyromegaly Respiratory: clear to auscultation bilaterally, no wheezing, no crackles. Normal respiratory effort. No accessory muscle use.  Cardiovascular: Regular rate and rhythm, no murmurs / rubs / gallops.  No pedal edema.  Extremities warm.   Abdomen: obese, no tenderness, no masses palpated. No hepatosplenomegaly. Bowel sounds positive.  Musculoskeletal: no clubbing / cyanosis. No joint deformity upper and lower extremities. Skin:  no rashes, lesions, ulcers. No induration Neurologic: No facial asymmetry, speech fluent, moving extremities spontaneously.  Psychiatric: Normal judgment and insight. Alert and oriented x 3. Normal mood.   Labs on Admission: I have personally reviewed following labs and imaging studies  CBC: Recent Labs  Lab 06/25/23 1014  WBC 8.6  NEUTROABS 6.5  HGB 14.2  HCT 45.3  MCV 99.6  PLT 222   Basic Metabolic Panel: Recent Labs  Lab 06/25/23 1005  NA 136  K 3.0*  CL 95*  CO2 33*  GLUCOSE 96  BUN 14  CREATININE 0.55*  CALCIUM 8.6*  MG 1.9   GFR: Estimated Creatinine Clearance: 116 mL/min (A) (by C-G formula based on SCr of 0.55 mg/dL (L)). Liver Function Tests: Recent Labs  Lab 06/25/23 1005  AST 22  ALT 20  ALKPHOS 149*  BILITOT 1.5*  PROT 5.7*  ALBUMIN 3.3*   Coagulation Profile: Recent Labs  Lab 06/25/23 1005  INR 1.2   Thyroid Function Tests: Recent Labs    06/25/23 1005  TSH 6.886*   Radiological Exams on Admission: CT Angio Chest PE W and/or Wo Contrast Result Date: 06/25/2023 CLINICAL DATA:  Hypoxia.  Recently status post spine surgery EXAM: CT ANGIOGRAPHY CHEST WITH CONTRAST TECHNIQUE: Multidetector CT imaging of the chest was performed using the standard protocol during bolus administration of intravenous contrast. Multiplanar CT image reconstructions and MIPs were obtained to evaluate the vascular anatomy. RADIATION DOSE REDUCTION: This exam was performed according to the departmental dose-optimization program which includes automated exposure control, adjustment of the mA and/or kV according to patient size and/or use of iterative reconstruction technique. CONTRAST:  75mL OMNIPAQUE IOHEXOL 350 MG/ML SOLN COMPARISON:  Same day chest radiograph, thoracic spine MRI dated 05/28/2023 FINDINGS: Decreased sensitivity and specificity for detailed findings due to motion artifact. Cardiovascular: Right chest wall port tip terminates at the superior cavoatrial  junction. Left chest wall pacemaker leads terminate in the right atrium and ventricle. The study is adequate for the evaluation of pulmonary embolism to the level of the proximal segmental arteries. There are no filling defects in the central, lobar, proximal segmental segmental pulmonary artery branches to suggest acute pulmonary embolism. Great vessels are normal in course and caliber.  Multichamber cardiomegaly. No significant pericardial fluid/thickening. Coronary artery calcifications and aortic atherosclerosis. Mediastinum/Nodes: Imaged thyroid gland without nodules meeting criteria for imaging follow-up by size. Normal esophagus. No pathologically enlarged axillary, supraclavicular, mediastinal, or hilar lymph nodes. Lungs/Pleura: The central airways are patent. Subsegmental mucous plugging in the lingula and left lower lobe. Bilateral lower lobe relaxation atelectasis. No pneumothorax. Moderate bilateral pleural effusions, increased compared to 05/28/2023. Upper abdomen: Normal. Musculoskeletal: No acute or abnormal lytic or blastic osseous lesions. Postsurgical changes of T10-12. Small foci of gas within the T11-12 intervertebral disc space. Review of the MIP images confirms the above findings. IMPRESSION: 1. Decreased sensitivity and specificity for detailed findings due to motion artifact. No evidence of pulmonary embolism to the level of the proximal segmental arteries. 2. Moderate bilateral pleural effusions, increased compared to 05/28/2023. 3. Multichamber cardiomegaly. 4. Gas within the T11-12 intervertebral disc space, which may be postsurgical. 5. Aortic Atherosclerosis (ICD10-I70.0). Coronary artery calcifications. Assessment for potential risk factor modification, dietary therapy or pharmacologic therapy may be warranted, if clinically indicated. Electronically Signed   By: Agustin Cree M.D.   On: 06/25/2023 14:56   DG Chest Port 1 View Result Date: 06/25/2023 CLINICAL DATA:  Hypoxia EXAM:  PORTABLE CHEST 1 VIEW COMPARISON:  Chest radiograph 06/04/2023 FINDINGS: Port-A-Cath tip projects over the superior vena cava. Dual lead pacer apparatus overlies the left hemithorax. Stable cardiomegaly. Similar bilateral lower lung heterogeneous opacities. No definite large pleural effusion or pneumothorax. Thoracic spine degenerative changes. Spinal fusion hardware. IMPRESSION: Similar bilateral lower lung heterogeneous opacities which may represent atelectasis or infection. Electronically Signed   By: Annia Belt M.D.   On: 06/25/2023 12:17    EKG: Independently reviewed.  Atrial fibrillation rate 79, QTc 518.  LBBB.  PVCs.  Assessment/Plan Principal Problem:   Acute hypoxic respiratory failure (HCC) Active Problems:   Atrial fibrillation (HCC)   Acute on chronic diastolic CHF (congestive heart failure) (HCC)   Hypokalemia   Essential hypertension   Adrenal insufficiency (HCC)  Assessment and Plan: * Acute hypoxic respiratory failure (HCC) O2 sats here in the ED down to 79% on room air, initially placed on nonrebreather and then weaned down to 3 L.  He denies respiratory symptoms.  Likely due to increasing bilateral pleural effusions.  Recent hospitalization also with acute respiratory failure deemed secondary to immobility and pain.  He was to follow-up as outpatient for sleep study and possibly PFTs. Addendum, patient's had large volume black melenotic stools in ED. Last Eliquis dose was yesterday 2/17.. -Consult GI, trend H&H, DC Eliquis, hold off on further diuresis, n.p.o. midnight, still admit to floor for now unless vitals unstable.  GI bleed Large volume melanotic stool in ED.  On anticoagulation with Eliquis. -Plan per above  Acute on chronic diastolic CHF (congestive heart failure) (HCC) CTA chest shows increasing moderate bilateral pleural effusions, BNP elevated at 461, baseline 100s to 200.  No peripheral signs of edema my evaluation, per charts no significant weight gain.   Last echo 09/2022 EF of 60 to 65%, with LVH. -Antibiotics were started with initial concern for pneumonia on chest x-ray which was not present out on subsequent CTA chest -IV Lasix 40 mg twice daily x 3, further dosing pending clinical course. -Troponin 32 > 36   Atrial fibrillation (HCC) Rate controlled and on anticoagulation with Eliquis.  Last dose was yesterday.  Presenting with spontaneous nosebleeds today-controlled with pressure and Afrin.  Hypokalemia Potassium 3.  Mag 1.9. - Replete  Adrenal insufficiency (HCC) Blood pressure  stable. -Resume 10 mg daily prednisone, Florinef and midodrine -Doubt need for stress dose steroids at this time  Essential hypertension Stable. -Resume midodrine, Florinef   DVT prophylaxis: Eliquis Code Status: FULL code Family Communication: None at bedside Disposition Plan: ~ 2 days Consults called: None Admission status: INpt Tele I certify that at the point of admission it is my clinical judgment that the patient will require inpatient hospital care spanning beyond 2 midnights from the point of admission due to high intensity of service, high risk for further deterioration and high frequency of surveillance required.    Author: Onnie Boer, MD 06/25/2023 10:22 PM  For on call review [email or web address].

## 2023-06-25 NOTE — ED Notes (Signed)
 Hospitalist at bedside

## 2023-06-25 NOTE — Assessment & Plan Note (Addendum)
CTA chest shows increasing moderate bilateral pleural effusions, BNP elevated at 461, baseline 100s to 200.  No peripheral signs of edema my evaluation, per charts no significant weight gain.  Last echo 09/2022 EF of 60 to 65%, with LVH. -Antibiotics were started with initial concern for pneumonia on chest x-ray which was not present out on subsequent CTA chest -IV Lasix 40 mg twice daily x 3, further dosing pending clinical course. -Troponin 32 > 36

## 2023-06-25 NOTE — ED Notes (Signed)
Patient son Viviann Spare updated on patient status and patients request for son to bring his phone and dentures to the hospital.

## 2023-06-25 NOTE — Assessment & Plan Note (Signed)
Large volume melanotic stool in ED.  On anticoagulation with Eliquis. -Plan per above

## 2023-06-25 NOTE — Assessment & Plan Note (Addendum)
Blood pressure stable. -Resume 10 mg daily prednisone, Florinef and midodrine -Doubt need for stress dose steroids at this time

## 2023-06-25 NOTE — ED Triage Notes (Signed)
Patient BIB EMS, Patient came home from nursing home yesterday had back surgery. Called out for nose bleed. EMS in due to 79-80% on room air was put oon non-re breather 15L sat come up to 98%, afrin given for nose bleed.

## 2023-06-25 NOTE — ED Provider Notes (Signed)
[place] EMERGENCY DEPARTMENT AT [facility] Provider Note   CSN: [id number] Arrival date & time: 06/25/23  4098     History  Chief Complaint  Patient presents with   Epistaxis   Respiratory Distress    Roy [name] is a 73 y.o. male.   Epistaxis Patient presents for epistaxis and shortness of breath.  Medical history includes atrial fibrillation, GERD, HLD, HTN, aortic stenosis, lymphoma, BPH, CHF, neuropathy.  He recently underwent spinal surgery.  He has since been able to ambulate with a walker.  He returned home from rehab facility yesterday.  This morning, approximately 1 hour prior to arrival, he had persistent nosebleed.  EMS was called to his home.  Patient estimates that epistaxis lasted for 30 minutes.  He did have ongoing nosebleed when EMS arrived.  They gave Afrin and direct pressure.  Nosebleed has since resolved.  EMS noted that he was hypoxic on room air.  He was placed on a nonrebreather.  Patient denies use of supplemental oxygen at baseline.  He is on Eliquis for history of atrial fibrillation.  Last dose was last night.     Home Medications Prior to Admission medications   Medication Sig Start Date End Date Taking? Authorizing Provider  acetaminophen (TYLENOL) 325 MG tablet Take 2 tablets (650 mg total) by mouth every 6 (six) hours as needed for headache. 01/13/21   Mariea Clonts, Courage, MD  albuterol (VENTOLIN HFA) 108 (90 Base) MCG/ACT inhaler Inhale 2 puffs into the lungs every 6 (six) hours as needed for wheezing or shortness of breath. 06/24/23   Sharee Holster, NP  alfuzosin (UROXATRAL) 10 MG 24 hr tablet TAKE ONE TABLET DAILY WITH BREAKFAST 06/24/23   Sharee Holster, NP  apixaban (ELIQUIS) 5 MG TABS tablet Take 1 tablet (5 mg total) by mouth 2 (two) times daily. 06/24/23   Sharee Holster, NP  bisacodyl (DULCOLAX) 10 MG suppository Place 1 suppository (10 mg total) rectally daily as needed for moderate constipation. 06/06/23   Osvaldo Shipper, MD   calcium carbonate (OS-CAL - DOSED IN MG OF ELEMENTAL CALCIUM) 1250 (500 Ca) MG tablet Take 1 tablet (1,250 mg total) by mouth 2 (two) times daily with a meal. 06/06/23   Osvaldo Shipper, MD  ferrous sulfate 325 (65 FE) MG tablet Take 1 tablet (325 mg total) by mouth daily with breakfast. 06/24/23   Sharee Holster, NP  fludrocortisone (FLORINEF) 0.1 MG tablet Take 1 tablet (0.1 mg total) by mouth daily. 06/24/23   Sharee Holster, NP  levothyroxine (SYNTHROID) 100 MCG tablet Take 1 tablet (100 mcg total) by mouth daily before breakfast. 06/24/23   Sharee Holster, NP  magnesium oxide (MAG-OX) 400 (240 Mg) MG tablet Take 1 tablet (400 mg total) by mouth daily. 06/24/23   Sharee Holster, NP  meloxicam (MOBIC) 7.5 MG tablet Take 1 tablet (7.5 mg total) by mouth daily. 06/24/23   Sharee Holster, NP  methocarbamol (ROBAXIN) 500 MG tablet Take 1 tablet (500 mg total) by mouth every 6 (six) hours as needed for muscle spasms. 06/24/23   Sharee Holster, NP  midodrine (PROAMATINE) 5 MG tablet Take 1 tablet (5 mg total) by mouth 2 (two) times daily with a meal. 06/24/23   Chilton Si, Chong Sicilian, NP  omeprazole (PRILOSEC) 10 MG capsule Take 20 mg by mouth daily. 10/26/21   [provider]  polyethylene glycol (MIRALAX / GLYCOLAX) 17 g packet Take 17 g by mouth 2 (two) times daily.  06/06/23   Osvaldo Shipper, MD  predniSONE (DELTASONE) 10 MG tablet Take 1 tablet (10 mg total) by mouth daily with breakfast. 06/24/23   Sharee Holster, NP  pregabalin (LYRICA) 200 MG capsule Take 1 capsule (200 mg total) by mouth 2 (two) times daily. 06/24/23   Sharee Holster, NP  rosuvastatin (CRESTOR) 5 MG tablet Take 1 tablet (5 mg total) by mouth at bedtime. 06/24/23   Sharee Holster, NP  senna-docusate (SENOKOT-S) 8.6-50 MG tablet Take 2 tablets by mouth 2 (two) times daily. 06/06/23   Osvaldo Shipper, MD  valACYclovir (VALTREX) 1000 MG tablet Take 1 tablet (1,000 mg total) by mouth daily. 06/24/23   Sharee Holster, NP   vitamin B-12 (CYANOCOBALAMIN) 1000 MCG tablet Take 1 tablet (1,000 mcg total) by mouth daily. 02/28/21   Carnella Guadalajara, PA-C  Vitamin D, Ergocalciferol, (DRISDOL) 1.25 MG (50000 UNIT) CAPS capsule Take 1 capsule (50,000 Units total) by mouth every 7 (seven) days. 06/24/23   Sharee Holster, NP      Allergies    Adhesive [tape] and Sulfa antibiotics    Review of Systems   Review of Systems  HENT:  Positive for nosebleeds.   All other systems reviewed and are negative.   Physical Exam Updated Vital Signs BP 103/87   Pulse 70   Temp 98 F (36.7 C) (Oral)   Resp (!) 26   Ht 6' (1.829 m)   Wt 132.9 kg   SpO2 98%   BMI 39.74 kg/m  Physical Exam Vitals and nursing note reviewed.  Constitutional:      General: He is not in acute distress.    Appearance: He is well-developed. He is not toxic-appearing or diaphoretic.  HENT:     Head: Normocephalic and atraumatic.     Right Ear: External ear normal.     Left Ear: External ear normal.     Nose:     Comments: No active bleeding.  Dried blood present in left nares.    Mouth/Throat:     Mouth: Mucous membranes are moist.     Comments: Dried blood present in oral cavity.  No evidence of active bleeding in oropharynx. Eyes:     Extraocular Movements: Extraocular movements intact.     Conjunctiva/sclera: Conjunctivae normal.  Cardiovascular:     Rate and Rhythm: Normal rate and regular rhythm.  Pulmonary:     Effort: Pulmonary effort is normal. Tachypnea present. No respiratory distress.     Breath sounds: Decreased breath sounds present. No wheezing or rhonchi.  Abdominal:     General: There is no distension.     Palpations: Abdomen is soft.     Tenderness: There is no abdominal tenderness.  Musculoskeletal:        General: No swelling. Normal range of motion.     Cervical back: Normal range of motion and neck supple.  Skin:    General: Skin is warm and dry.     Coloration: Skin is not jaundiced or pale.   Neurological:     General: No focal deficit present.     Mental Status: He is alert and oriented to person, place, and time.  Psychiatric:        Mood and Affect: Mood normal.        Behavior: Behavior normal.     ED Results / Procedures / Treatments   Labs (all labs ordered are listed, but only abnormal results are displayed) Labs Reviewed  COMPREHENSIVE METABOLIC PANEL -  Abnormal; Notable for the following components:      Result Value   Potassium 3.0 (*)    Chloride 95 (*)    CO2 33 (*)    Creatinine, Ser 0.55 (*)    Calcium 8.6 (*)    Total Protein 5.7 (*)    Albumin 3.3 (*)    Alkaline Phosphatase 149 (*)    Total Bilirubin 1.5 (*)    All other components within normal limits  BRAIN NATRIURETIC PEPTIDE - Abnormal; Notable for the following components:   B Natriuretic Peptide 461.0 (*)    All other components within normal limits  BLOOD GAS, VENOUS - Abnormal; Notable for the following components:   pCO2, Ven 75 (*)    Bicarbonate 44.4 (*)    Acid-Base Excess 15.4 (*)    All other components within normal limits  PROTIME-INR - Abnormal; Notable for the following components:   Prothrombin Time 15.9 (*)    All other components within normal limits  TSH - Abnormal; Notable for the following components:   TSH 6.886 (*)    All other components within normal limits  TROPONIN I (HIGH SENSITIVITY) - Abnormal; Notable for the following components:   Troponin I (High Sensitivity) 32 (*)    All other components within normal limits  TROPONIN I (HIGH SENSITIVITY) - Abnormal; Notable for the following components:   Troponin I (High Sensitivity) 36 (*)    All other components within normal limits  RESP PANEL BY RT-PCR (RSV, FLU A&B, COVID)  [id number]  MAGNESIUM  CBC WITH DIFFERENTIAL/PLATELET  CBC WITH DIFFERENTIAL/PLATELET    EKG EKG Interpretation Date/Time:  Tuesday June 25 2023 10:32:47 EST Ventricular Rate:  79 PR Interval:    QRS Duration:  156 QT  Interval:  451 QTC Calculation: 518 R Axis:   -39  Text Interpretation: VENTRICULAR PACED RHYTHM Left bundle branch block Confirmed by Gloris Manchester ([id number]) on 06/25/2023 11:57:03 AM  Radiology CT Angio Chest PE W and/or Wo Contrast Result Date: 06/25/2023 CLINICAL DATA:  Hypoxia.  Recently status post spine surgery EXAM: CT ANGIOGRAPHY CHEST WITH CONTRAST TECHNIQUE: Multidetector CT imaging of the chest was performed using the standard protocol during bolus administration of intravenous contrast. Multiplanar CT image reconstructions and MIPs were obtained to evaluate the vascular anatomy. RADIATION DOSE REDUCTION: This exam was performed according to the departmental dose-optimization program which includes automated exposure control, adjustment of the mA and/or kV according to patient size and/or use of iterative reconstruction technique. CONTRAST:  75mL OMNIPAQUE IOHEXOL 350 MG/ML SOLN COMPARISON:  Same day chest radiograph, thoracic spine MRI dated 05/28/2023 FINDINGS: Decreased sensitivity and specificity for detailed findings due to motion artifact. Cardiovascular: Right chest wall port tip terminates at the superior cavoatrial junction. Left chest wall pacemaker leads terminate in the right atrium and ventricle. The study is adequate for the evaluation of pulmonary embolism to the level of the proximal segmental arteries. There are no filling defects in the central, lobar, proximal segmental segmental pulmonary artery branches to suggest acute pulmonary embolism. Great vessels are normal in course and caliber. Multichamber cardiomegaly. No significant pericardial fluid/thickening. Coronary artery calcifications and aortic atherosclerosis. Mediastinum/Nodes: Imaged thyroid gland without nodules meeting criteria for imaging follow-up by size. Normal esophagus. No pathologically enlarged axillary, supraclavicular, mediastinal, or hilar lymph nodes. Lungs/Pleura: The central airways are patent. Subsegmental  mucous plugging in the lingula and left lower lobe. Bilateral lower lobe relaxation atelectasis. No pneumothorax. Moderate bilateral pleural effusions, increased compared to 05/28/2023. Upper abdomen: Normal. Musculoskeletal: No  acute or abnormal lytic or blastic osseous lesions. Postsurgical changes of T10-12. Small foci of gas within the T11-12 intervertebral disc space. Review of the MIP images confirms the above findings. IMPRESSION: 1. Decreased sensitivity and specificity for detailed findings due to motion artifact. No evidence of pulmonary embolism to the level of the proximal segmental arteries. 2. Moderate bilateral pleural effusions, increased compared to 05/28/2023. 3. Multichamber cardiomegaly. 4. Gas within the T11-12 intervertebral disc space, which may be postsurgical. 5. Aortic Atherosclerosis (ICD10-I70.0). Coronary artery calcifications. Assessment for potential risk factor modification, dietary therapy or pharmacologic therapy may be warranted, if clinically indicated. Electronically Signed   By: Agustin Cree M.D.   On: 06/25/2023 14:56   DG Chest Port 1 View Result Date: 06/25/2023 CLINICAL DATA:  Hypoxia EXAM: PORTABLE CHEST 1 VIEW COMPARISON:  Chest radiograph 06/04/2023 FINDINGS: Port-A-Cath tip projects over the superior vena cava. Dual lead pacer apparatus overlies the left hemithorax. Stable cardiomegaly. Similar bilateral lower lung heterogeneous opacities. No definite large pleural effusion or pneumothorax. Thoracic spine degenerative changes. Spinal fusion hardware. IMPRESSION: Similar bilateral lower lung heterogeneous opacities which may represent atelectasis or infection. Electronically Signed   By: Annia Belt M.D.   On: 06/25/2023 12:17    Procedures Procedures    Medications Ordered in ED Medications  oxymetazoline (AFRIN) 0.05 % nasal spray 1 spray (1 spray Each Nare Given 06/25/23 1021)  fentaNYL (SUBLIMAZE) injection 50 mcg (50 mcg Intravenous Given 06/25/23 1140)   potassium chloride SA (KLOR-CON M) CR tablet 40 mEq (40 mEq Oral Given 06/25/23 1140)  iohexol (OMNIPAQUE) 350 MG/ML injection 75 mL (75 mLs Intravenous Contrast Given 06/25/23 1157)  cefTRIAXone (ROCEPHIN) 1 g in sodium chloride 0.9 % 100 mL IVPB (0 g Intravenous Stopped 06/25/23 1452)  azithromycin (ZITHROMAX) 500 mg in sodium chloride 0.9 % 250 mL IVPB (0 mg Intravenous Stopped 06/25/23 1452)  furosemide (LASIX) injection 40 mg (40 mg Intravenous Given 06/25/23 1328)    ED Course/ Medical Decision Making/ A&P                                 Medical Decision Making Amount and/or Complexity of Data Reviewed Labs: ordered. Radiology: ordered.  Risk OTC drugs. Prescription drug management.   This patient presents to the ED for concern of epistaxis and shortness of breath, this involves an extensive number of treatment options, and is a complaint that carries with it a high risk of complications and morbidity.  The differential diagnosis includes pneumonia, CHF exacerbation, aspiration, anemia   Co morbidities that complicate the patient evaluation  atrial fibrillation, GERD, HLD, HTN, aortic stenosis, lymphoma, BPH, CHF, neuropathy   Additional history obtained:  Additional history obtained from EMS External records from outside source obtained and reviewed including EMR   Lab Tests:  I Ordered, and personally interpreted labs.  The pertinent results include: Compensated hypercarbia on blood gas.  Kidney function is normal.  Hypokalemia is present with otherwise normal electrolytes.  No leukocytosis is present.  Troponin mildly elevated but stable on repeat.  BNP elevated.   Imaging Studies ordered:  I ordered imaging studies including x-ray, CTA chest I independently visualized and interpreted imaging which showed bilateral pleural effusions, bibasilar atelectasis I agree with the radiologist interpretation   Cardiac Monitoring: / EKG:  The patient was maintained on a  cardiac monitor.  I personally viewed and interpreted the cardiac monitored which showed an underlying rhythm of: Paced rhythm  Problem List / ED Course / Critical interventions / Medication management  Patient presenting initially for epistaxis but found to have new hypoxia with EMS.  On arrival in the ED, epistaxis has resolved.  He remains hypoxic with mildly increased work of breathing.  SpO2 on room air is 80%.  He is able to speak in complete sentences.  He has diminished breath sounds on lung auscultation.  Patient was placed on nonrebreather initially but able to be weaned down to 3 L.  X-ray showed concern for bibasilar pneumonia.  Antibiotics were ordered.  Lab work notable for hypokalemia and elevated BNP.  I suspect hypervolemia as additional cause of his new hypoxia.  Replacement potassium was ordered.  Lasix was ordered for diuresis.  On CTA, there was no evidence of PE.  He does have moderate-sized pleural effusions which are increased from imaging studies a month ago.  He had no further epistaxis while in the ED.  Patient to be admitted for further management. I ordered medication including ceftriaxone and azithromycin for empiric treatment of pneumonia; potassium chloride for hypokalemia; Lasix for diuresis; fentanyl for analgesia Reevaluation of the patient after these medicines showed that the patient improved I have reviewed the patients home medicines and have made adjustments as needed   Social Determinants of Health:  Returned home from rehab facility yesterday.  Pulmonology at baseline.  CRITICAL CARE Performed by: Gloris Manchester   Total critical care time: 32 minutes  Critical care time was exclusive of separately billable procedures and treating other patients.  Critical care was necessary to treat or prevent imminent or life-threatening deterioration.  Critical care was time spent personally by me on the following activities: development of treatment plan with patient  and/or surrogate as well as nursing, discussions with consultants, evaluation of patient's response to treatment, examination of patient, obtaining history from patient or surrogate, ordering and performing treatments and interventions, ordering and review of laboratory studies, ordering and review of radiographic studies, pulse oximetry and re-evaluation of patient's condition.          Final Clinical Impression(s) / ED Diagnoses Final diagnoses:  Acute on chronic congestive heart failure, unspecified heart failure type (HCC)  Acute respiratory failure with hypoxia [facility])    Rx / DC Orders ED Discharge Orders     None         Gloris Manchester, MD 06/25/23 [id number]

## 2023-06-25 NOTE — Assessment & Plan Note (Addendum)
Rate controlled and on anticoagulation with Eliquis.  Last dose was yesterday.  Presenting with spontaneous nosebleeds today-controlled with pressure and Afrin.

## 2023-06-25 NOTE — ED Notes (Signed)
Pt called out that he had a BM. Went to change the pt and noticed the smell and the look of the pt BM. RN was notified and occult was done that showed positive. MD notified too. Pt has been completely changed, cleaned up and switched to a hospital bed. Malewick is in place.  Call light is reach and bed lowered.

## 2023-06-25 NOTE — Assessment & Plan Note (Addendum)
O2 sats here in the ED down to 79% on room air, initially placed on nonrebreather and then weaned down to 3 L.  He denies respiratory symptoms.  Likely due to increasing bilateral pleural effusions.  Recent hospitalization also with acute respiratory failure deemed secondary to immobility and pain.  He was to follow-up as outpatient for sleep study and possibly PFTs. Addendum, patient's had large volume black melenotic stools in ED. Last Eliquis dose was yesterday 2/17.. -Consult GI, trend H&H, DC Eliquis, hold off on further diuresis, n.p.o. midnight, still admit to floor for now unless vitals unstable.

## 2023-06-25 NOTE — ED Notes (Signed)
MD made of aware that patient requesting pain medication and the patient also coughed up a blood clot, no active bleeding noted to nose of in the back of throat. Continuing to monitor.

## 2023-06-26 DIAGNOSIS — J9601 Acute respiratory failure with hypoxia: Secondary | ICD-10-CM | POA: Diagnosis not present

## 2023-06-26 DIAGNOSIS — K921 Melena: Secondary | ICD-10-CM | POA: Diagnosis not present

## 2023-06-26 LAB — BASIC METABOLIC PANEL
Anion gap: 10 (ref 5–15)
BUN: 17 mg/dL (ref 8–23)
CO2: 36 mmol/L — ABNORMAL HIGH (ref 22–32)
Calcium: 8.7 mg/dL — ABNORMAL LOW (ref 8.9–10.3)
Chloride: 95 mmol/L — ABNORMAL LOW (ref 98–111)
Creatinine, Ser: 0.57 mg/dL — ABNORMAL LOW (ref 0.61–1.24)
GFR, Estimated: >60 mL/min (ref >60)
Glucose, Bld: 66 mg/dL — ABNORMAL LOW (ref 70–99)
Potassium: 3.3 mmol/L — ABNORMAL LOW (ref 3.5–5.1)
Sodium: 141 mmol/L (ref 135–145)

## 2023-06-26 LAB — CBC
HCT: 47.2 % (ref 39.0–52.0)
Hemoglobin: 14.7 g/dL (ref 13.0–17.0)
MCH: 30.9 pg (ref 26.0–34.0)
MCHC: 31.1 g/dL (ref 30.0–36.0)
MCV: 99.4 fL (ref 80.0–100.0)
Platelets: 215 10*3/uL (ref 150–400)
RBC: 4.75 MIL/uL (ref 4.22–5.81)
RDW: 15.4 % (ref 11.5–15.5)
WBC: 9.9 10*3/uL (ref 4.0–10.5)
nRBC: 0.2 % (ref 0.0–0.2)

## 2023-06-26 LAB — CBG MONITORING, ED
Glucose-Capillary: 52 mg/dL — ABNORMAL LOW (ref 70–99)
Glucose-Capillary: 90 mg/dL (ref 70–99)
Glucose-Capillary: 118 mg/dL — ABNORMAL HIGH (ref 70–99)

## 2023-06-26 LAB — GLUCOSE, CAPILLARY: Glucose-Capillary: 155 mg/dL — ABNORMAL HIGH (ref 70–99)

## 2023-06-26 LAB — HEMOGLOBIN AND HEMATOCRIT, BLOOD
HCT: 44.3 % (ref 39.0–52.0)
Hemoglobin: 13.8 g/dL (ref 13.0–17.0)

## 2023-06-26 LAB — MRSA NEXT GEN BY PCR, NASAL: MRSA by PCR Next Gen: NOT DETECTED

## 2023-06-26 MED ORDER — SODIUM CHLORIDE 0.9 % IV SOLN: 250.0000 mL | INTRAVENOUS | Status: AC

## 2023-06-26 MED ORDER — CHLORHEXIDINE GLUCONATE CLOTH 2 % EX PADS
6.0000 | MEDICATED_PAD | Freq: Every day | CUTANEOUS | Status: DC
  Administered 2023-06-26 – 2023-06-27 (×2): 6 via TOPICAL

## 2023-06-26 MED ORDER — SODIUM CHLORIDE 0.9 % IV BOLUS
500.0000 mL | Freq: Once | INTRAVENOUS | Status: AC
  Administered 2023-06-26: 500 mL via INTRAVENOUS

## 2023-06-26 MED ORDER — KCL IN DEXTROSE-NACL 20-5-0.45 MEQ/L-%-% IV SOLN
INTRAVENOUS | Status: DC
  Filled 2023-06-26 (×2): qty 1000

## 2023-06-26 MED ORDER — SALINE SPRAY 0.65 % NA SOLN
1.0000 | NASAL | Status: DC | PRN
  Administered 2023-06-26 – 2023-06-27 (×2): 1 via NASAL
  Filled 2023-06-26: qty 44

## 2023-06-26 MED ORDER — POTASSIUM CHLORIDE CRYS ER 20 MEQ PO TBCR
40.0000 meq | EXTENDED_RELEASE_TABLET | ORAL | Status: AC
  Administered 2023-06-26 (×2): 40 meq via ORAL
  Filled 2023-06-26 (×2): qty 2

## 2023-06-26 MED ORDER — HYDROCORTISONE SOD SUC (PF) 100 MG IJ SOLR
50.0000 mg | Freq: Three times a day (TID) | INTRAMUSCULAR | Status: AC
  Administered 2023-06-27 – 2023-06-28 (×6): 50 mg via INTRAVENOUS
  Filled 2023-06-26 (×6): qty 2

## 2023-06-26 MED ORDER — HYDROCORTISONE SOD SUC (PF) 100 MG IJ SOLR
100.0000 mg | Freq: Once | INTRAMUSCULAR | Status: AC
  Administered 2023-06-26: 100 mg via INTRAVENOUS
  Filled 2023-06-26: qty 2

## 2023-06-26 MED ORDER — MIDODRINE HCL 5 MG PO TABS
10.0000 mg | ORAL_TABLET | Freq: Three times a day (TID) | ORAL | Status: DC
  Administered 2023-06-26 – 2023-06-29 (×8): 10 mg via ORAL
  Filled 2023-06-26 (×9): qty 2

## 2023-06-26 MED ORDER — NOREPINEPHRINE 4 MG/250ML-% IV SOLN
2.0000 ug/min | INTRAVENOUS | Status: DC
  Administered 2023-06-26: 2 ug/min via INTRAVENOUS
  Filled 2023-06-26: qty 250

## 2023-06-26 NOTE — Progress Notes (Addendum)
BP 80/60 upon arrival to unit. MD notified. Transfer order received. Son, Viviann Spare Saffran, called and notified of transfer. Will move to ICU room 12.  Lidia Collum, RN

## 2023-06-26 NOTE — Consult Note (Addendum)
Gastroenterology Consult   Referring Provider: No ref. provider found Primary Care Physician:  Rebecka Apley, NP Primary Gastroenterologist:  not established   Patient ID: Roy [name]; [id number]; [birth year]   Admit date: 06/25/2023  LOS: 1 day   Date of Consultation: 06/26/2023  Reason for Consultation:  melena   History of Present Illness   Roy [name] is a 73 y.o. year old male with medical history of atrial fibrillation maintained on Eliquis, diffuse large B-cell lymphoma, GERD, high cholesterol, hypertension who presented to the ER with epistaxis and shortness of breath.  Patient had large melanotic stool while in the ED, which was Heme positive, GI consulted for further evaluation  ED course: Vital signs stable other than O2 sat down to 80% on room air, patient placed on nonrebreather initially and weaned down to 3 L O2 via nasal cannula. Potassium 3 BNP 461 Chest x-ray suggested bibasilar pneumonia, subsequent CTA chest Motion artifacts, negative for PE, moderate bilateral pleural effusions increased from prior imaging Patient started on antibiotics given concern for pneumonia, IV Lasix 40 mg given FOBT positive   Consult: Patient reports black stools for a while. Reports he takes iron pills and noted dark stools after starting these therefore he attributed it to the iron. Patient denies hematochezia, nausea, vomiting, diarrhea,  dysphagia, odyonophagia, early satiety or weight loss. He has constipation at baseline. Takes miralax for this which provides good results. He denies any changes to his appetite or weight loss.   O2 sat during my encounter was around 93% on 2L humidified O2 via n.c.  Last eliquis dose was Monday evening  No NSAID use No ETOH or tobacco use   Last EGD: never Last colonoscopy: unsure if he has had one   Past Medical History:  Diagnosis Date   Atrial fibrillation (HCC)    Cancer (HCC)    Diffuse Large B-Cell lymphoma   GERD  (gastroesophageal reflux disease)    High cholesterol    Hypertension    Obesity     Past Surgical History:  Procedure Laterality Date   APPENDECTOMY     CATARACT EXTRACTION Bilateral 2022   CHOLECYSTECTOMY     ESOPHAGEAL DILATION     multiple times   IR IMAGING GUIDED PORT INSERTION  03/31/2021   JOINT REPLACEMENT Left    hip   LUMBAR PERCUTANEOUS PEDICLE SCREW 3 LEVEL  05/27/2023   Procedure: POSTERIOR FIXATION AND FUSION OF THORACIC ELEVEN-THORACIC TWELVE FRACTURE DISLOCATION. FIXATION FROM THORACIC TEN-THORACIC ELEVEN METHACRYLATE AUGMENTATION WITH FLUOROSCOPIC GUIDANCE;  Surgeon: Barnett Abu, MD;  Location: [facility] OR;  Service: Neurosurgery;;   PACEMAKER IMPLANT N/A 09/07/2022   Procedure: PACEMAKER IMPLANT;  Surgeon: Regan Lemming, MD;  Location: [facility] INVASIVE CV [facility];  Service: Cardiovascular;  Laterality: N/A;    Prior to Admission medications   Medication Sig Start Date End Date Taking? Authorizing Provider  acetaminophen (TYLENOL) 325 MG tablet Take 2 tablets (650 mg total) by mouth every 6 (six) hours as needed for headache. 01/13/21  Yes Emokpae, Courage, MD  alfuzosin (UROXATRAL) 10 MG 24 hr tablet TAKE ONE TABLET DAILY WITH BREAKFAST 06/24/23  Yes Sharee Holster, NP  apixaban (ELIQUIS) 5 MG TABS tablet Take 1 tablet (5 mg total) by mouth 2 (two) times daily. 06/24/23  Yes Sharee Holster, NP  calcium carbonate (OS-CAL - DOSED IN MG OF ELEMENTAL CALCIUM) 1250 (500 Ca) MG tablet Take 1 tablet (1,250 mg total) by mouth 2 (two) times daily with a meal. 06/06/23  Yes Osvaldo Shipper, MD  ferrous sulfate 325 (65 FE) MG tablet Take 1 tablet (325 mg total) by mouth daily with breakfast. 06/24/23  Yes Sharee Holster, NP  fludrocortisone (FLORINEF) 0.1 MG tablet Take 1 tablet (0.1 mg total) by mouth daily. 06/24/23  Yes Sharee Holster, NP  levothyroxine (SYNTHROID) 100 MCG tablet Take 1 tablet (100 mcg total) by mouth daily before breakfast. 06/24/23  Yes Sharee Holster, NP   meloxicam (MOBIC) 7.5 MG tablet Take 1 tablet (7.5 mg total) by mouth daily. 06/24/23  Yes Sharee Holster, NP  methocarbamol (ROBAXIN) 500 MG tablet Take 1 tablet (500 mg total) by mouth every 6 (six) hours as needed for muscle spasms. 06/24/23  Yes Sharee Holster, NP  midodrine (PROAMATINE) 5 MG tablet Take 1 tablet (5 mg total) by mouth 2 (two) times daily with a meal. 06/24/23  Yes Sharee Holster, NP  omeprazole (PRILOSEC) 10 MG capsule Take 20 mg by mouth daily. 10/26/21  Yes [provider]  polyethylene glycol (MIRALAX / GLYCOLAX) 17 g packet Take 17 g by mouth 2 (two) times daily. 06/06/23  Yes Osvaldo Shipper, MD  predniSONE (DELTASONE) 10 MG tablet Take 1 tablet (10 mg total) by mouth daily with breakfast. 06/24/23  Yes Sharee Holster, NP  pregabalin (LYRICA) 200 MG capsule Take 1 capsule (200 mg total) by mouth 2 (two) times daily. 06/24/23  Yes Sharee Holster, NP  rosuvastatin (CRESTOR) 5 MG tablet Take 1 tablet (5 mg total) by mouth at bedtime. 06/24/23  Yes Sharee Holster, NP  senna-docusate (SENOKOT-S) 8.6-50 MG tablet Take 2 tablets by mouth 2 (two) times daily. 06/06/23  Yes Osvaldo Shipper, MD  valACYclovir (VALTREX) 1000 MG tablet Take 1 tablet (1,000 mg total) by mouth daily. 06/24/23  Yes Sharee Holster, NP  vitamin B-12 (CYANOCOBALAMIN) 1000 MCG tablet Take 1 tablet (1,000 mcg total) by mouth daily. 02/28/21  Yes Pennington, Rebekah M, PA-C  Vitamin D, Ergocalciferol, (DRISDOL) 1.25 MG (50000 UNIT) CAPS capsule Take 1 capsule (50,000 Units total) by mouth every 7 (seven) days. 06/24/23  Yes Sharee Holster, NP  magnesium oxide (MAG-OX) 400 (240 Mg) MG tablet Take 1 tablet (400 mg total) by mouth daily. 06/24/23   Sharee Holster, NP    Current Facility-Administered Medications  Medication Dose Route Frequency Provider Last Rate Last Admin   acetaminophen (TYLENOL) tablet 650 mg  650 mg Oral Q6H PRN Emokpae, Ejiroghene E, MD       Or   acetaminophen (TYLENOL)  suppository 650 mg  650 mg Rectal Q6H PRN Emokpae, Ejiroghene E, MD       alfuzosin (UROXATRAL) 24 hr tablet 10 mg  10 mg Oral Q breakfast Emokpae, Ejiroghene E, MD   10 mg at 06/26/23 0810   fludrocortisone (FLORINEF) tablet 0.1 mg  0.1 mg Oral Daily Emokpae, Ejiroghene E, MD       levothyroxine (SYNTHROID) tablet 100 mcg  100 mcg Oral QAC breakfast Emokpae, Ejiroghene E, MD   100 mcg at 06/26/23 0630   midodrine (PROAMATINE) tablet 5 mg  5 mg Oral BID WC Emokpae, Ejiroghene E, MD   5 mg at 06/26/23 0812   pantoprazole (PROTONIX) injection 40 mg  40 mg Intravenous Q12H Emokpae, Ejiroghene E, MD   40 mg at 06/26/23 1005   polyethylene glycol (MIRALAX / GLYCOLAX) packet 17 g  17 g Oral Daily PRN Emokpae, Ejiroghene E, MD       potassium chloride SA (  KLOR-CON M) CR tablet 40 mEq  40 mEq Oral Q3H Emokpae, Courage, MD       predniSONE (DELTASONE) tablet 10 mg  10 mg Oral Q breakfast Emokpae, Ejiroghene E, MD   10 mg at 06/26/23 1610   pregabalin (LYRICA) capsule 200 mg  200 mg Oral BID Emokpae, Ejiroghene E, MD   200 mg at 06/25/23 2236   rosuvastatin (CRESTOR) tablet 5 mg  5 mg Oral QHS Emokpae, Ejiroghene E, MD   5 mg at 06/25/23 2236   senna-docusate (Senokot-S) tablet 2 tablet  2 tablet Oral BID Emokpae, Ejiroghene E, MD       valACYclovir (VALTREX) tablet 1,000 mg  1,000 mg Oral Daily Emokpae, Ejiroghene E, MD   1,000 mg at 06/25/23 9604   Current Outpatient Medications  Medication Sig Dispense Refill   acetaminophen (TYLENOL) 325 MG tablet Take 2 tablets (650 mg total) by mouth every 6 (six) hours as needed for headache. 30 tablet 1   alfuzosin (UROXATRAL) 10 MG 24 hr tablet TAKE ONE TABLET DAILY WITH BREAKFAST 30 tablet 0   apixaban (ELIQUIS) 5 MG TABS tablet Take 1 tablet (5 mg total) by mouth 2 (two) times daily. 60 tablet 0   calcium carbonate (OS-CAL - DOSED IN MG OF ELEMENTAL CALCIUM) 1250 (500 Ca) MG tablet Take 1 tablet (1,250 mg total) by mouth 2 (two) times daily with a meal.      ferrous sulfate 325 (65 FE) MG tablet Take 1 tablet (325 mg total) by mouth daily with breakfast. 30 tablet 0   fludrocortisone (FLORINEF) 0.1 MG tablet Take 1 tablet (0.1 mg total) by mouth daily. 30 tablet 0   levothyroxine (SYNTHROID) 100 MCG tablet Take 1 tablet (100 mcg total) by mouth daily before breakfast. 30 tablet 0   meloxicam (MOBIC) 7.5 MG tablet Take 1 tablet (7.5 mg total) by mouth daily. 30 tablet 0   methocarbamol (ROBAXIN) 500 MG tablet Take 1 tablet (500 mg total) by mouth every 6 (six) hours as needed for muscle spasms. 20 tablet 0   midodrine (PROAMATINE) 5 MG tablet Take 1 tablet (5 mg total) by mouth 2 (two) times daily with a meal. 60 tablet 0   omeprazole (PRILOSEC) 10 MG capsule Take 20 mg by mouth daily.     polyethylene glycol (MIRALAX / GLYCOLAX) 17 g packet Take 17 g by mouth 2 (two) times daily.     predniSONE (DELTASONE) 10 MG tablet Take 1 tablet (10 mg total) by mouth daily with breakfast. 30 tablet 0   pregabalin (LYRICA) 200 MG capsule Take 1 capsule (200 mg total) by mouth 2 (two) times daily. 30 capsule 0   rosuvastatin (CRESTOR) 5 MG tablet Take 1 tablet (5 mg total) by mouth at bedtime. 30 tablet 0   senna-docusate (SENOKOT-S) 8.6-50 MG tablet Take 2 tablets by mouth 2 (two) times daily.     valACYclovir (VALTREX) 1000 MG tablet Take 1 tablet (1,000 mg total) by mouth daily. 30 tablet 0   vitamin B-12 (CYANOCOBALAMIN) 1000 MCG tablet Take 1 tablet (1,000 mcg total) by mouth daily. 30 tablet 3   Vitamin D, Ergocalciferol, (DRISDOL) 1.25 MG (50000 UNIT) CAPS capsule Take 1 capsule (50,000 Units total) by mouth every 7 (seven) days. 4 capsule 0   magnesium oxide (MAG-OX) 400 (240 Mg) MG tablet Take 1 tablet (400 mg total) by mouth daily. 30 tablet 0    Allergies as of 06/25/2023 - Review Complete 06/25/2023  Allergen Reaction Noted   Adhesive [  tape] Other (See Comments) 08/17/2011   Sulfa antibiotics Other (See Comments) 08/17/2011    Family History   Problem Relation Age of Onset   Heart failure Father    Heart attack Father        Deceased    Thyroid disease Sister     Social History   Socioeconomic History   Marital status: Widowed    Spouse name: Not on file   Number of children: 3   Years of education: Not on file   Highest education level: Not on file  Occupational History   Occupation: Retired  Tobacco Use   Smoking status: Never    Passive exposure: Never   Smokeless tobacco: Never  Vaping Use   Vaping status: Never Used  Substance and Sexual Activity   Alcohol use: No   Drug use: No   Sexual activity: Not Currently  Other Topics Concern   Not on file  Social History Narrative   Not on file   Social Drivers of Health   Financial Resource Strain: Low Risk  (05/15/2023)   Received from [facility]   Overall Financial Resource Strain (CARDIA)    Difficulty of Paying Living Expenses: Not very hard  Food Insecurity: No Food Insecurity (06/25/2023)   Hunger Vital Sign    Worried About Running Out of Food in the Last Year: Never true    Ran Out of Food in the Last Year: Never true  Transportation Needs: No Transportation Needs (06/25/2023)   PRAPARE - Administrator, Civil Service (Medical): No    Lack of Transportation (Non-Medical): No  Physical Activity: Unknown (04/25/2023)   Received from [facility]   Exercise Vital Sign    Days of Exercise per Week: 0 days    Minutes of Exercise per Session: Not on file  Recent Concern: Physical Activity - Inactive (04/25/2023)   Received from [facility]   Exercise Vital Sign    Days of Exercise per Week: 0 days    Minutes of Exercise per Session: 60 min  Stress: No Stress Concern Present (04/25/2023)   Received from [facility] of Occupational Health - Occupational Stress Questionnaire    Feeling of Stress : Not at all  Social Connections: Moderately Isolated (06/25/2023)   Social Connection and Isolation Panel [NHANES]     Frequency of Communication with Friends and Family: Three times a week    Frequency of Social Gatherings with Friends and Family: Once a week    Attends Religious Services: More than 4 times per year    Active Member of [employer] or Organizations: No    Attends Banker Meetings: Never    Marital Status: Widowed  Intimate Partner Violence: Not At Risk (06/25/2023)   Humiliation, Afraid, Rape, and Kick questionnaire    Fear of Current or Ex-Partner: No    Emotionally Abused: No    Physically Abused: No    Sexually Abused: No     Review of Systems   Gen: Denies any fever, chills, loss of appetite, change in weight or weight loss CV: Denies chest pain, heart palpitations, syncope, edema  Resp: Denies shortness of breath with rest, cough, wheezing, coughing up blood, and pleurisy. GI: +melena  denies hematochezia, nausea, vomiting, diarrhea, constipation, dysphagia, odyonophagia, early satiety or weight loss.  GU : Denies urinary burning, blood in urine, urinary frequency, and urinary incontinence. MS: Denies joint pain, limitation of movement, swelling, cramps, and atrophy.  Derm: Denies rash,  itching, dry skin, hives. Psych: Denies depression, anxiety, memory loss, hallucinations, and confusion. Heme: Denies bruising or bleeding Neuro:  Denies any headaches, dizziness, paresthesias, shaking  Physical Exam   Vital Signs in last 24 hours: Temp:  [97.7 F (36.5 C)-98.1 F (36.7 C)] 98.1 F (36.7 C) (02/19 0726) Pulse Rate:  [55-91] 58 (02/19 0830) Resp:  [11-26] 22 (02/19 0830) BP: (96-141)/(60-93) 96/60 (02/19 0830) SpO2:  [93 %-99 %] 95 % (02/19 0830)    General:   Alert,  Well-developed, well-nourished, pleasant and cooperative in NAD Head:  Normocephalic and atraumatic. Eyes:  Sclera clear, no icterus.   Conjunctiva pink. Ears:  Normal auditory acuity. Mouth:  No deformity or lesions, dentition normal. Lungs: lung sounds clear, somewhat labored breathing  Heart:   Regular rate and rhythm; no murmurs, clicks, rubs,  or gallops. Abdomen:  Soft, nontender and nondistended. No masses, hepatosplenomegaly or hernias noted. Normal bowel sounds, without guarding, and without rebound.   Skin:  Intact without significant lesions or rashes. Psych:  Alert and cooperative. Normal mood and affect.  Intake/Output from previous day: 02/18 0701 - 02/19 0700 In: -  Out: 2900 [Urine:2900] Intake/Output this shift: No intake/output data recorded.   Labs/Studies   Recent Labs Recent Labs    06/25/23 1014 06/25/23 2223 06/26/23 0507  WBC 8.6  --  9.9  HGB 14.2 14.7 14.7  HCT 45.3 45.8 47.2  PLT 222  --  215   BMET Recent Labs    06/25/23 1005 06/26/23 0507  NA 136 141  K 3.0* 3.3*  CL 95* 95*  CO2 33* 36*  GLUCOSE 96 66*  BUN 14 17  CREATININE 0.55* 0.57*  CALCIUM 8.6* 8.7*   LFT Recent Labs    06/25/23 1005  PROT 5.7*  ALBUMIN 3.3*  AST 22  ALT 20  ALKPHOS 149*  BILITOT 1.5*   PT/INR Recent Labs    06/25/23 1005  LABPROT 15.9*  INR 1.2   Radiology/Studies CUP PACEART REMOTE DEVICE CHECK Result Date: 06/25/2023 Scheduled remote reviewed. Normal device function.  Known permanent AF, on OAC, good ventricular rate control Next remote 91 days. ML, CVRS  CT Angio Chest PE W and/or Wo Contrast Result Date: 06/25/2023 CLINICAL DATA:  Hypoxia.  Recently status post spine surgery EXAM: CT ANGIOGRAPHY CHEST WITH CONTRAST TECHNIQUE: Multidetector CT imaging of the chest was performed using the standard protocol during bolus administration of intravenous contrast. Multiplanar CT image reconstructions and MIPs were obtained to evaluate the vascular anatomy. RADIATION DOSE REDUCTION: This exam was performed according to the departmental dose-optimization program which includes automated exposure control, adjustment of the mA and/or kV according to patient size and/or use of iterative reconstruction technique. CONTRAST:  75mL OMNIPAQUE IOHEXOL 350  MG/ML SOLN COMPARISON:  Same day chest radiograph, thoracic spine MRI dated 05/28/2023 FINDINGS: Decreased sensitivity and specificity for detailed findings due to motion artifact. Cardiovascular: Right chest wall port tip terminates at the superior cavoatrial junction. Left chest wall pacemaker leads terminate in the right atrium and ventricle. The study is adequate for the evaluation of pulmonary embolism to the level of the proximal segmental arteries. There are no filling defects in the central, lobar, proximal segmental segmental pulmonary artery branches to suggest acute pulmonary embolism. Great vessels are normal in course and caliber. Multichamber cardiomegaly. No significant pericardial fluid/thickening. Coronary artery calcifications and aortic atherosclerosis. Mediastinum/Nodes: Imaged thyroid gland without nodules meeting criteria for imaging follow-up by size. Normal esophagus. No pathologically enlarged axillary, supraclavicular, mediastinal, or hilar  lymph nodes. Lungs/Pleura: The central airways are patent. Subsegmental mucous plugging in the lingula and left lower lobe. Bilateral lower lobe relaxation atelectasis. No pneumothorax. Moderate bilateral pleural effusions, increased compared to 05/28/2023. Upper abdomen: Normal. Musculoskeletal: No acute or abnormal lytic or blastic osseous lesions. Postsurgical changes of T10-12. Small foci of gas within the T11-12 intervertebral disc space. Review of the MIP images confirms the above findings. IMPRESSION: 1. Decreased sensitivity and specificity for detailed findings due to motion artifact. No evidence of pulmonary embolism to the level of the proximal segmental arteries. 2. Moderate bilateral pleural effusions, increased compared to 05/28/2023. 3. Multichamber cardiomegaly. 4. Gas within the T11-12 intervertebral disc space, which may be postsurgical. 5. Aortic Atherosclerosis (ICD10-I70.0). Coronary artery calcifications. Assessment for potential  risk factor modification, dietary therapy or pharmacologic therapy may be warranted, if clinically indicated. Electronically Signed   By: Agustin Cree M.D.   On: 06/25/2023 14:56   DG Chest Port 1 View Result Date: 06/25/2023 CLINICAL DATA:  Hypoxia EXAM: PORTABLE CHEST 1 VIEW COMPARISON:  Chest radiograph 06/04/2023 FINDINGS: Port-A-Cath tip projects over the superior vena cava. Dual lead pacer apparatus overlies the left hemithorax. Stable cardiomegaly. Similar bilateral lower lung heterogeneous opacities. No definite large pleural effusion or pneumothorax. Thoracic spine degenerative changes. Spinal fusion hardware. IMPRESSION: Similar bilateral lower lung heterogeneous opacities which may represent atelectasis or infection. Electronically Signed   By: Annia Belt M.D.   On: 06/25/2023 12:17     Assessment   Roy [name] is a 73 y.o. year old male with medical history of atrial fibrillation maintained on Eliquis, diffuse large B-cell lymphoma, GERD, high cholesterol, hypertension who presented to the ER with epistaxis and shortness of breath.  Patient had large bloody melanotic stool while in the ED, GI consulted for further evaluation  Melena: Large-volume melanotic stool in the ED with FOBT positive. BUN WNL.patient reports some ongoing dark stools as outpatient though attributed this to his iron pills. He denies any other GI complaints. Is not taking NSAIDs. Last eliquis dose was Monday evening.   Hemoglobin has remained stable initially for 14.2, 14.7 this morning. Would recommend proceeding with EGD while inpatient given concern for UGIB in setting of chronic anticoagulation.   Given patient is requiring supplemental O2, will make patient NPO and reassess in the a.m. for possible EGD tomorrow.   Plan / Recommendations    PPI twice daily N.p.o. midnight EGD Tomorrow as long as clinically stable  Trend H&H, monitor for overt GI bleeding 5. Continue to hold eliquis    06/26/2023, 10:06  AM  Flavio Lindroth L. Jeanmarie Hubert, MSN, APRN, AGNP-C Adult-Gerontology Nurse Practitioner [facility] Gastroenterology at [facility]

## 2023-06-26 NOTE — ED Notes (Signed)
Pt given orange juice due to CBG of 52.

## 2023-06-26 NOTE — H&P (View-Only) (Signed)
Gastroenterology Consult   Referring Provider: No ref. provider found Primary Care Physician:  Rebecka Apley, NP Primary Gastroenterologist:  not established   Patient ID: Roy [name]; [id number]; [birth year]   Admit date: 06/25/2023  LOS: 1 day   Date of Consultation: 06/26/2023  Reason for Consultation:  melena   History of Present Illness   Roy [name] is a 73 y.o. year old male with medical history of atrial fibrillation maintained on Eliquis, diffuse large B-cell lymphoma, GERD, high cholesterol, hypertension who presented to the ER with epistaxis and shortness of breath.  Patient had large melanotic stool while in the ED, which was Heme positive, GI consulted for further evaluation  ED course: Vital signs stable other than O2 sat down to 80% on room air, patient placed on nonrebreather initially and weaned down to 3 L O2 via nasal cannula. Potassium 3 BNP 461 Chest x-ray suggested bibasilar pneumonia, subsequent CTA chest Motion artifacts, negative for PE, moderate bilateral pleural effusions increased from prior imaging Patient started on antibiotics given concern for pneumonia, IV Lasix 40 mg given FOBT positive   Consult: Patient reports black stools for a while. Reports he takes iron pills and noted dark stools after starting these therefore he attributed it to the iron. Patient denies hematochezia, nausea, vomiting, diarrhea,  dysphagia, odyonophagia, early satiety or weight loss. He has constipation at baseline. Takes miralax for this which provides good results. He denies any changes to his appetite or weight loss.   O2 sat during my encounter was around 93% on 2L humidified O2 via n.c.  Last eliquis dose was Monday evening  No NSAID use No ETOH or tobacco use   Last EGD: never Last colonoscopy: unsure if he has had one   Past Medical History:  Diagnosis Date   Atrial fibrillation (HCC)    Cancer (HCC)    Diffuse Large B-Cell lymphoma   GERD  (gastroesophageal reflux disease)    High cholesterol    Hypertension    Obesity     Past Surgical History:  Procedure Laterality Date   APPENDECTOMY     CATARACT EXTRACTION Bilateral 2022   CHOLECYSTECTOMY     ESOPHAGEAL DILATION     multiple times   IR IMAGING GUIDED PORT INSERTION  03/31/2021   JOINT REPLACEMENT Left    hip   LUMBAR PERCUTANEOUS PEDICLE SCREW 3 LEVEL  05/27/2023   Procedure: POSTERIOR FIXATION AND FUSION OF THORACIC ELEVEN-THORACIC TWELVE FRACTURE DISLOCATION. FIXATION FROM THORACIC TEN-THORACIC ELEVEN METHACRYLATE AUGMENTATION WITH FLUOROSCOPIC GUIDANCE;  Surgeon: Barnett Abu, MD;  Location: [facility] OR;  Service: Neurosurgery;;   PACEMAKER IMPLANT N/A 09/07/2022   Procedure: PACEMAKER IMPLANT;  Surgeon: Regan Lemming, MD;  Location: [facility] INVASIVE CV [facility];  Service: Cardiovascular;  Laterality: N/A;    Prior to Admission medications   Medication Sig Start Date End Date Taking? Authorizing Provider  acetaminophen (TYLENOL) 325 MG tablet Take 2 tablets (650 mg total) by mouth every 6 (six) hours as needed for headache. 01/13/21  Yes Emokpae, Courage, MD  alfuzosin (UROXATRAL) 10 MG 24 hr tablet TAKE ONE TABLET DAILY WITH BREAKFAST 06/24/23  Yes Sharee Holster, NP  apixaban (ELIQUIS) 5 MG TABS tablet Take 1 tablet (5 mg total) by mouth 2 (two) times daily. 06/24/23  Yes Sharee Holster, NP  calcium carbonate (OS-CAL - DOSED IN MG OF ELEMENTAL CALCIUM) 1250 (500 Ca) MG tablet Take 1 tablet (1,250 mg total) by mouth 2 (two) times daily with a meal. 06/06/23  Yes Osvaldo Shipper, MD  ferrous sulfate 325 (65 FE) MG tablet Take 1 tablet (325 mg total) by mouth daily with breakfast. 06/24/23  Yes Sharee Holster, NP  fludrocortisone (FLORINEF) 0.1 MG tablet Take 1 tablet (0.1 mg total) by mouth daily. 06/24/23  Yes Sharee Holster, NP  levothyroxine (SYNTHROID) 100 MCG tablet Take 1 tablet (100 mcg total) by mouth daily before breakfast. 06/24/23  Yes Sharee Holster, NP   meloxicam (MOBIC) 7.5 MG tablet Take 1 tablet (7.5 mg total) by mouth daily. 06/24/23  Yes Sharee Holster, NP  methocarbamol (ROBAXIN) 500 MG tablet Take 1 tablet (500 mg total) by mouth every 6 (six) hours as needed for muscle spasms. 06/24/23  Yes Sharee Holster, NP  midodrine (PROAMATINE) 5 MG tablet Take 1 tablet (5 mg total) by mouth 2 (two) times daily with a meal. 06/24/23  Yes Sharee Holster, NP  omeprazole (PRILOSEC) 10 MG capsule Take 20 mg by mouth daily. 10/26/21  Yes [provider]  polyethylene glycol (MIRALAX / GLYCOLAX) 17 g packet Take 17 g by mouth 2 (two) times daily. 06/06/23  Yes Osvaldo Shipper, MD  predniSONE (DELTASONE) 10 MG tablet Take 1 tablet (10 mg total) by mouth daily with breakfast. 06/24/23  Yes Sharee Holster, NP  pregabalin (LYRICA) 200 MG capsule Take 1 capsule (200 mg total) by mouth 2 (two) times daily. 06/24/23  Yes Sharee Holster, NP  rosuvastatin (CRESTOR) 5 MG tablet Take 1 tablet (5 mg total) by mouth at bedtime. 06/24/23  Yes Sharee Holster, NP  senna-docusate (SENOKOT-S) 8.6-50 MG tablet Take 2 tablets by mouth 2 (two) times daily. 06/06/23  Yes Osvaldo Shipper, MD  valACYclovir (VALTREX) 1000 MG tablet Take 1 tablet (1,000 mg total) by mouth daily. 06/24/23  Yes Sharee Holster, NP  vitamin B-12 (CYANOCOBALAMIN) 1000 MCG tablet Take 1 tablet (1,000 mcg total) by mouth daily. 02/28/21  Yes Pennington, Rebekah M, PA-C  Vitamin D, Ergocalciferol, (DRISDOL) 1.25 MG (50000 UNIT) CAPS capsule Take 1 capsule (50,000 Units total) by mouth every 7 (seven) days. 06/24/23  Yes Sharee Holster, NP  magnesium oxide (MAG-OX) 400 (240 Mg) MG tablet Take 1 tablet (400 mg total) by mouth daily. 06/24/23   Sharee Holster, NP    Current Facility-Administered Medications  Medication Dose Route Frequency Provider Last Rate Last Admin   acetaminophen (TYLENOL) tablet 650 mg  650 mg Oral Q6H PRN Emokpae, Ejiroghene E, MD       Or   acetaminophen (TYLENOL)  suppository 650 mg  650 mg Rectal Q6H PRN Emokpae, Ejiroghene E, MD       alfuzosin (UROXATRAL) 24 hr tablet 10 mg  10 mg Oral Q breakfast Emokpae, Ejiroghene E, MD   10 mg at 06/26/23 0810   fludrocortisone (FLORINEF) tablet 0.1 mg  0.1 mg Oral Daily Emokpae, Ejiroghene E, MD       levothyroxine (SYNTHROID) tablet 100 mcg  100 mcg Oral QAC breakfast Emokpae, Ejiroghene E, MD   100 mcg at 06/26/23 0630   midodrine (PROAMATINE) tablet 5 mg  5 mg Oral BID WC Emokpae, Ejiroghene E, MD   5 mg at 06/26/23 0812   pantoprazole (PROTONIX) injection 40 mg  40 mg Intravenous Q12H Emokpae, Ejiroghene E, MD   40 mg at 06/26/23 1005   polyethylene glycol (MIRALAX / GLYCOLAX) packet 17 g  17 g Oral Daily PRN Emokpae, Ejiroghene E, MD       potassium chloride SA (  KLOR-CON M) CR tablet 40 mEq  40 mEq Oral Q3H Emokpae, Courage, MD       predniSONE (DELTASONE) tablet 10 mg  10 mg Oral Q breakfast Emokpae, Ejiroghene E, MD   10 mg at 06/26/23 1610   pregabalin (LYRICA) capsule 200 mg  200 mg Oral BID Emokpae, Ejiroghene E, MD   200 mg at 06/25/23 2236   rosuvastatin (CRESTOR) tablet 5 mg  5 mg Oral QHS Emokpae, Ejiroghene E, MD   5 mg at 06/25/23 2236   senna-docusate (Senokot-S) tablet 2 tablet  2 tablet Oral BID Emokpae, Ejiroghene E, MD       valACYclovir (VALTREX) tablet 1,000 mg  1,000 mg Oral Daily Emokpae, Ejiroghene E, MD   1,000 mg at 06/25/23 9604   Current Outpatient Medications  Medication Sig Dispense Refill   acetaminophen (TYLENOL) 325 MG tablet Take 2 tablets (650 mg total) by mouth every 6 (six) hours as needed for headache. 30 tablet 1   alfuzosin (UROXATRAL) 10 MG 24 hr tablet TAKE ONE TABLET DAILY WITH BREAKFAST 30 tablet 0   apixaban (ELIQUIS) 5 MG TABS tablet Take 1 tablet (5 mg total) by mouth 2 (two) times daily. 60 tablet 0   calcium carbonate (OS-CAL - DOSED IN MG OF ELEMENTAL CALCIUM) 1250 (500 Ca) MG tablet Take 1 tablet (1,250 mg total) by mouth 2 (two) times daily with a meal.      ferrous sulfate 325 (65 FE) MG tablet Take 1 tablet (325 mg total) by mouth daily with breakfast. 30 tablet 0   fludrocortisone (FLORINEF) 0.1 MG tablet Take 1 tablet (0.1 mg total) by mouth daily. 30 tablet 0   levothyroxine (SYNTHROID) 100 MCG tablet Take 1 tablet (100 mcg total) by mouth daily before breakfast. 30 tablet 0   meloxicam (MOBIC) 7.5 MG tablet Take 1 tablet (7.5 mg total) by mouth daily. 30 tablet 0   methocarbamol (ROBAXIN) 500 MG tablet Take 1 tablet (500 mg total) by mouth every 6 (six) hours as needed for muscle spasms. 20 tablet 0   midodrine (PROAMATINE) 5 MG tablet Take 1 tablet (5 mg total) by mouth 2 (two) times daily with a meal. 60 tablet 0   omeprazole (PRILOSEC) 10 MG capsule Take 20 mg by mouth daily.     polyethylene glycol (MIRALAX / GLYCOLAX) 17 g packet Take 17 g by mouth 2 (two) times daily.     predniSONE (DELTASONE) 10 MG tablet Take 1 tablet (10 mg total) by mouth daily with breakfast. 30 tablet 0   pregabalin (LYRICA) 200 MG capsule Take 1 capsule (200 mg total) by mouth 2 (two) times daily. 30 capsule 0   rosuvastatin (CRESTOR) 5 MG tablet Take 1 tablet (5 mg total) by mouth at bedtime. 30 tablet 0   senna-docusate (SENOKOT-S) 8.6-50 MG tablet Take 2 tablets by mouth 2 (two) times daily.     valACYclovir (VALTREX) 1000 MG tablet Take 1 tablet (1,000 mg total) by mouth daily. 30 tablet 0   vitamin B-12 (CYANOCOBALAMIN) 1000 MCG tablet Take 1 tablet (1,000 mcg total) by mouth daily. 30 tablet 3   Vitamin D, Ergocalciferol, (DRISDOL) 1.25 MG (50000 UNIT) CAPS capsule Take 1 capsule (50,000 Units total) by mouth every 7 (seven) days. 4 capsule 0   magnesium oxide (MAG-OX) 400 (240 Mg) MG tablet Take 1 tablet (400 mg total) by mouth daily. 30 tablet 0    Allergies as of 06/25/2023 - Review Complete 06/25/2023  Allergen Reaction Noted   Adhesive [  tape] Other (See Comments) 08/17/2011   Sulfa antibiotics Other (See Comments) 08/17/2011    Family History   Problem Relation Age of Onset   Heart failure Father    Heart attack Father        Deceased    Thyroid disease Sister     Social History   Socioeconomic History   Marital status: Widowed    Spouse name: Not on file   Number of children: 3   Years of education: Not on file   Highest education level: Not on file  Occupational History   Occupation: Retired  Tobacco Use   Smoking status: Never    Passive exposure: Never   Smokeless tobacco: Never  Vaping Use   Vaping status: Never Used  Substance and Sexual Activity   Alcohol use: No   Drug use: No   Sexual activity: Not Currently  Other Topics Concern   Not on file  Social History Narrative   Not on file   Social Drivers of Health   Financial Resource Strain: Low Risk  (05/15/2023)   Received from [facility]   Overall Financial Resource Strain (CARDIA)    Difficulty of Paying Living Expenses: Not very hard  Food Insecurity: No Food Insecurity (06/25/2023)   Hunger Vital Sign    Worried About Running Out of Food in the Last Year: Never true    Ran Out of Food in the Last Year: Never true  Transportation Needs: No Transportation Needs (06/25/2023)   PRAPARE - Administrator, Civil Service (Medical): No    Lack of Transportation (Non-Medical): No  Physical Activity: Unknown (04/25/2023)   Received from [facility]   Exercise Vital Sign    Days of Exercise per Week: 0 days    Minutes of Exercise per Session: Not on file  Recent Concern: Physical Activity - Inactive (04/25/2023)   Received from [facility]   Exercise Vital Sign    Days of Exercise per Week: 0 days    Minutes of Exercise per Session: 60 min  Stress: No Stress Concern Present (04/25/2023)   Received from [facility] of Occupational Health - Occupational Stress Questionnaire    Feeling of Stress : Not at all  Social Connections: Moderately Isolated (06/25/2023)   Social Connection and Isolation Panel [NHANES]     Frequency of Communication with Friends and Family: Three times a week    Frequency of Social Gatherings with Friends and Family: Once a week    Attends Religious Services: More than 4 times per year    Active Member of [employer] or Organizations: No    Attends Banker Meetings: Never    Marital Status: Widowed  Intimate Partner Violence: Not At Risk (06/25/2023)   Humiliation, Afraid, Rape, and Kick questionnaire    Fear of Current or Ex-Partner: No    Emotionally Abused: No    Physically Abused: No    Sexually Abused: No     Review of Systems   Gen: Denies any fever, chills, loss of appetite, change in weight or weight loss CV: Denies chest pain, heart palpitations, syncope, edema  Resp: Denies shortness of breath with rest, cough, wheezing, coughing up blood, and pleurisy. GI: +melena  denies hematochezia, nausea, vomiting, diarrhea, constipation, dysphagia, odyonophagia, early satiety or weight loss.  GU : Denies urinary burning, blood in urine, urinary frequency, and urinary incontinence. MS: Denies joint pain, limitation of movement, swelling, cramps, and atrophy.  Derm: Denies rash,  itching, dry skin, hives. Psych: Denies depression, anxiety, memory loss, hallucinations, and confusion. Heme: Denies bruising or bleeding Neuro:  Denies any headaches, dizziness, paresthesias, shaking  Physical Exam   Vital Signs in last 24 hours: Temp:  [97.7 F (36.5 C)-98.1 F (36.7 C)] 98.1 F (36.7 C) (02/19 0726) Pulse Rate:  [55-91] 58 (02/19 0830) Resp:  [11-26] 22 (02/19 0830) BP: (96-141)/(60-93) 96/60 (02/19 0830) SpO2:  [93 %-99 %] 95 % (02/19 0830)    General:   Alert,  Well-developed, well-nourished, pleasant and cooperative in NAD Head:  Normocephalic and atraumatic. Eyes:  Sclera clear, no icterus.   Conjunctiva pink. Ears:  Normal auditory acuity. Mouth:  No deformity or lesions, dentition normal. Lungs: lung sounds clear, somewhat labored breathing  Heart:   Regular rate and rhythm; no murmurs, clicks, rubs,  or gallops. Abdomen:  Soft, nontender and nondistended. No masses, hepatosplenomegaly or hernias noted. Normal bowel sounds, without guarding, and without rebound.   Skin:  Intact without significant lesions or rashes. Psych:  Alert and cooperative. Normal mood and affect.  Intake/Output from previous day: 02/18 0701 - 02/19 0700 In: -  Out: 2900 [Urine:2900] Intake/Output this shift: No intake/output data recorded.   Labs/Studies   Recent Labs Recent Labs    06/25/23 1014 06/25/23 2223 06/26/23 0507  WBC 8.6  --  9.9  HGB 14.2 14.7 14.7  HCT 45.3 45.8 47.2  PLT 222  --  215   BMET Recent Labs    06/25/23 1005 06/26/23 0507  NA 136 141  K 3.0* 3.3*  CL 95* 95*  CO2 33* 36*  GLUCOSE 96 66*  BUN 14 17  CREATININE 0.55* 0.57*  CALCIUM 8.6* 8.7*   LFT Recent Labs    06/25/23 1005  PROT 5.7*  ALBUMIN 3.3*  AST 22  ALT 20  ALKPHOS 149*  BILITOT 1.5*   PT/INR Recent Labs    06/25/23 1005  LABPROT 15.9*  INR 1.2   Radiology/Studies CUP PACEART REMOTE DEVICE CHECK Result Date: 06/25/2023 Scheduled remote reviewed. Normal device function.  Known permanent AF, on OAC, good ventricular rate control Next remote 91 days. ML, CVRS  CT Angio Chest PE W and/or Wo Contrast Result Date: 06/25/2023 CLINICAL DATA:  Hypoxia.  Recently status post spine surgery EXAM: CT ANGIOGRAPHY CHEST WITH CONTRAST TECHNIQUE: Multidetector CT imaging of the chest was performed using the standard protocol during bolus administration of intravenous contrast. Multiplanar CT image reconstructions and MIPs were obtained to evaluate the vascular anatomy. RADIATION DOSE REDUCTION: This exam was performed according to the departmental dose-optimization program which includes automated exposure control, adjustment of the mA and/or kV according to patient size and/or use of iterative reconstruction technique. CONTRAST:  75mL OMNIPAQUE IOHEXOL 350  MG/ML SOLN COMPARISON:  Same day chest radiograph, thoracic spine MRI dated 05/28/2023 FINDINGS: Decreased sensitivity and specificity for detailed findings due to motion artifact. Cardiovascular: Right chest wall port tip terminates at the superior cavoatrial junction. Left chest wall pacemaker leads terminate in the right atrium and ventricle. The study is adequate for the evaluation of pulmonary embolism to the level of the proximal segmental arteries. There are no filling defects in the central, lobar, proximal segmental segmental pulmonary artery branches to suggest acute pulmonary embolism. Great vessels are normal in course and caliber. Multichamber cardiomegaly. No significant pericardial fluid/thickening. Coronary artery calcifications and aortic atherosclerosis. Mediastinum/Nodes: Imaged thyroid gland without nodules meeting criteria for imaging follow-up by size. Normal esophagus. No pathologically enlarged axillary, supraclavicular, mediastinal, or hilar  lymph nodes. Lungs/Pleura: The central airways are patent. Subsegmental mucous plugging in the lingula and left lower lobe. Bilateral lower lobe relaxation atelectasis. No pneumothorax. Moderate bilateral pleural effusions, increased compared to 05/28/2023. Upper abdomen: Normal. Musculoskeletal: No acute or abnormal lytic or blastic osseous lesions. Postsurgical changes of T10-12. Small foci of gas within the T11-12 intervertebral disc space. Review of the MIP images confirms the above findings. IMPRESSION: 1. Decreased sensitivity and specificity for detailed findings due to motion artifact. No evidence of pulmonary embolism to the level of the proximal segmental arteries. 2. Moderate bilateral pleural effusions, increased compared to 05/28/2023. 3. Multichamber cardiomegaly. 4. Gas within the T11-12 intervertebral disc space, which may be postsurgical. 5. Aortic Atherosclerosis (ICD10-I70.0). Coronary artery calcifications. Assessment for potential  risk factor modification, dietary therapy or pharmacologic therapy may be warranted, if clinically indicated. Electronically Signed   By: Agustin Cree M.D.   On: 06/25/2023 14:56   DG Chest Port 1 View Result Date: 06/25/2023 CLINICAL DATA:  Hypoxia EXAM: PORTABLE CHEST 1 VIEW COMPARISON:  Chest radiograph 06/04/2023 FINDINGS: Port-A-Cath tip projects over the superior vena cava. Dual lead pacer apparatus overlies the left hemithorax. Stable cardiomegaly. Similar bilateral lower lung heterogeneous opacities. No definite large pleural effusion or pneumothorax. Thoracic spine degenerative changes. Spinal fusion hardware. IMPRESSION: Similar bilateral lower lung heterogeneous opacities which may represent atelectasis or infection. Electronically Signed   By: Annia Belt M.D.   On: 06/25/2023 12:17     Assessment   Roy [name] is a 73 y.o. year old male with medical history of atrial fibrillation maintained on Eliquis, diffuse large B-cell lymphoma, GERD, high cholesterol, hypertension who presented to the ER with epistaxis and shortness of breath.  Patient had large bloody melanotic stool while in the ED, GI consulted for further evaluation  Melena: Large-volume melanotic stool in the ED with FOBT positive. BUN WNL.patient reports some ongoing dark stools as outpatient though attributed this to his iron pills. He denies any other GI complaints. Is not taking NSAIDs. Last eliquis dose was Monday evening.   Hemoglobin has remained stable initially for 14.2, 14.7 this morning. Would recommend proceeding with EGD while inpatient given concern for UGIB in setting of chronic anticoagulation.   Given patient is requiring supplemental O2, will make patient NPO and reassess in the a.m. for possible EGD tomorrow.   Plan / Recommendations    PPI twice daily N.p.o. midnight EGD Tomorrow as long as clinically stable  Trend H&H, monitor for overt GI bleeding 5. Continue to hold eliquis    06/26/2023, 10:06  AM  Flavio Lindroth L. Jeanmarie Hubert, MSN, APRN, AGNP-C Adult-Gerontology Nurse Practitioner [facility] Gastroenter[facility] at [facility]

## 2023-06-26 NOTE — Progress Notes (Signed)
Placed patient on Dreamstation in Bipap mode of 10/5.  Was worried about fact patient has had recent nose bleeds.  RN placed order for normal saline, which was given prior to placing on Dreamstation.  I also left [place] on running on 3L with humidified air; despite fact this may cause more of a leak with mask.  Was worried about patient getting needed moisture.  Patient sat at this time is 95%.  BS are clear/diminished.  RN made aware.

## 2023-06-26 NOTE — Progress Notes (Signed)
PROGRESS NOTE   Roy [name], is a 73 y.o. male, DOB - October 23, 1950, ZOX:[id number]  Admit date - 06/25/2023   Admitting Physician Roy Boer, MD  Outpatient Primary MD for the patient is Hemberg, Roy Cola, NP  LOS - 1  Chief Complaint  Patient presents with   Epistaxis   Respiratory Distress       Brief Narrative:    73 y.o. male with medical history significant for atrial fibrillation on chronic anticoagulation with Eliquis, adrenal insufficiency, CHF, admitted on 06/25/2023 with acute hypoxic respiratory failure in the setting of nosebleeds and concerns about possible GI bleed, on 06/26/2023 patient was noted to be persistently hypotensive, subsequently transferred to stepdown unit for possible pressor support after failing IV fluid boluses.     -Assessment and Plan: 1) Acute hypoxic respiratory failure -suspect CHF related please see #5 below -CTA chest without acute PE (was on Eliquis PTA) -COVID, influenza and RSV negative -Unable to wean off oxygen currently requiring 4 L of oxygen via nasal cannula  2)GI bleed Large volume melanotic stool , Hemoccult positive.   On anticoagulation with Eliquis. -??  Homero Fellers GI bleed versus Hemoccult positive melanotic stools due to nosebleeds with passage of bleeding from epistasis into the GI tract compounded by ongoing iron therapy --GI consult appreciated possible EGD on 06/27/2023 if hemodynamically stable and stable from respiratory standpoint -Protonix for now  3) persistent hypotension--- received IV fluid boluses... BP remains soft -Some drop in hemoglobin most likely due to hemodilution rather than brisk bleeding -Suspect some component of adrenal insufficiency -Check random cortisol levels -Give stress dose IV hydrocortisone -Transferred to stepdown/ICU for pressor support with IV Levophed--- maintain MAP above 65 mmHg  4) episode of hypoglycemia--- while n.p.o., IV dextrose until oral intake resumes  5)Acute on  chronic diastolic CHF (congestive heart failure) (HCC) CTA chest shows increasing moderate bilateral pleural effusions, BNP elevated at 461, baseline 100s to 200.  No peripheral signs of edema my evaluation, per charts no significant weight gain.  Last echo 09/2022 EF of 60 to 65%, with LVH. -CTA chest without pneumonia, and clinical picture not consistent with pneumonia no further antibiotics needed -CTA chest with multichamber cardiomegaly and worsening bilateral pleural effusions -Initially received IV Lasix however no further Lasix for now due to persistent hypotension requiring pressor support -BNP is 461 which is higher than prior -Troponin 32 > 36 --- Be judicious with IV fluids  6)Atrial fibrillation (HCC) Rate controlled and on anticoagulation with Eliquis.  L -Eliquis on hold since 06/24/2023 -- On admission patient had spontaneous nosebleeds-controlled with pressure and Afrin.  7)Hypokalemia Potassium 3.  Mag 1.9. - Replete  8)Adrenal insufficiency (HCC) Blood pressure stable. -Prior to admission patient was on prednisone, Florinef and midodrine -Midodrine increased to 10 mg 3 times daily -Please see stress dose steroids as in  #3 above  9)Essential hypertension Now persistently hypotensive---please see #3 above -   CRITICAL CARE Performed by: Shon Hale   Total critical care time: 56 minutes  Critical care time was exclusive of separately billable procedures and treating other patients.  Critical care was necessary to treat or prevent imminent or life-threatening deterioration. - Persistent hypotension and hemodynamic instability requiring IV fluid boluses, IV hydrocortisone and IV Levophed for pressure support  Critical care was time spent personally by me on the following activities: development of treatment plan with patient and/or surrogate as well as nursing, discussions with consultants, evaluation of patient's response to treatment, examination of  patient, obtaining history  from patient or surrogate, ordering and performing treatments and interventions, ordering and review of laboratory studies, ordering and review of radiographic studies, pulse oximetry and re-evaluation of patient's condition.  Status is: Inpatient   Disposition: The patient is from: Home              Anticipated d/c is to: Home              Anticipated d/c date is: 2 days              Patient currently is not medically stable to d/c. Barriers: Not Clinically Stable-   Code Status :  -  Code Status: Full Code   Family Communication:    NA (patient is alert, awake and coherent)   DVT Prophylaxis  :   - SCDs/GI bleed/ Place and maintain sequential compression device Start: 06/25/23 2157   Lab Results  Component Value Date   PLT 215 06/26/2023    Inpatient Medications  Scheduled Meds:  Chlorhexidine Gluconate Cloth  6 each Topical Daily   fludrocortisone  0.1 mg Oral Daily   [START ON 06/27/2023] hydrocortisone sod succinate (SOLU-CORTEF) inj  50 mg Intravenous Q8H   levothyroxine  100 mcg Oral QAC breakfast   midodrine  10 mg Oral TID WC   pantoprazole (PROTONIX) IV  40 mg Intravenous Q12H   pregabalin  200 mg Oral BID   rosuvastatin  5 mg Oral QHS   senna-docusate  2 tablet Oral BID   valACYclovir  1,000 mg Oral Daily   Continuous Infusions:  sodium chloride     dextrose 5 % and 0.45 % NaCl with KCl 20 mEq/L 100 mL/hr at 06/26/23 1845   norepinephrine (LEVOPHED) Adult infusion 2 mcg/min (06/26/23 1845)   PRN Meds:.acetaminophen **OR** acetaminophen, polyethylene glycol, sodium chloride   Anti-infectives (From admission, onward)    Start     Dose/Rate Route Frequency Ordered Stop   06/25/23 1745  valACYclovir (VALTREX) tablet 1,000 mg        1,000 mg Oral Daily 06/25/23 1740     06/25/23 1300  cefTRIAXone (ROCEPHIN) 1 g in sodium chloride 0.9 % 100 mL IVPB        1 g 200 mL/hr over 30 Minutes Intravenous  Once 06/25/23 1256 06/25/23 1452    06/25/23 1300  azithromycin (ZITHROMAX) 500 mg in sodium chloride 0.9 % 250 mL IVPB        500 mg 250 mL/hr over 60 Minutes Intravenous  Once 06/25/23 1256 06/25/23 1452         Subjective: [name] today has no fevers, no emesis,   -No chest pains, no pleuritic symptoms -Unable to wean off oxygen -Voiding okay  Objective: Vitals:   06/26/23 1810 06/26/23 1827 06/26/23 1830 06/26/23 2008  BP: (!) 82/47 103/62 (!) 98/59   Pulse:  63 62   Resp: 15 (!) 23 20   Temp:    97.8 F (36.6 C)  TempSrc:    Axillary  SpO2:  98% 97%   Weight:      Height:        Intake/Output Summary (Last 24 hours) at 06/26/2023 2144 Last data filed at 06/26/2023 1845 Gross per 24 hour  Intake 517.25 ml  Output 650 ml  Net -132.75 ml   Filed Weights   06/25/23 0928 06/26/23 1710 06/26/23 1800  Weight: 132.9 kg 129.2 kg 129.2 kg    Physical Exam  Gen:- Awake Alert, no acute distress, speaking in sentences HEENT:- [place].AT, No  sclera icterus, subtle left eye ptosis, not new Nose---4L/min Neck-Supple Neck,No JVD,.  Lungs-diminished in bases, no wheezing  CV- S1, S2 normal, regular  Abd-  +ve B.Sounds, Abd Soft, No tenderness, increased truncal adiposity    Extremity/Skin:- No  edema, pedal pulses present  Psych-affect is appropriate, oriented x3 Neuro-no new focal deficits, no tremors  Data Reviewed: I have personally reviewed following labs and imaging studies  CBC: Recent Labs  Lab 06/25/23 1014 06/25/23 2223 06/26/23 0507 06/26/23 1517  WBC 8.6  --  9.9  --   NEUTROABS 6.5  --   --   --   HGB 14.2 14.7 14.7 13.8  HCT 45.3 45.8 47.2 44.3  MCV 99.6  --  99.4  --   PLT 222  --  215  --    Basic Metabolic Panel: Recent Labs  Lab 06/25/23 1005 06/26/23 0507  NA 136 141  K 3.0* 3.3*  CL 95* 95*  CO2 33* 36*  GLUCOSE 96 66*  BUN 14 17  CREATININE 0.55* 0.57*  CALCIUM 8.6* 8.7*  MG 1.9  --    GFR: Estimated Creatinine Clearance: 114.2 mL/min (A) (by C-G formula based  on SCr of 0.57 mg/dL (L)). Liver Function Tests: Recent Labs  Lab 06/25/23 1005  AST 22  ALT 20  ALKPHOS 149*  BILITOT 1.5*  PROT 5.7*  ALBUMIN 3.3*    Recent Results (from the past 240 hours)  Resp panel by RT-PCR (RSV, Flu A&B, Covid) Anterior Nasal Swab     Status: None   Collection Time: 06/25/23  9:41 AM   Specimen: Anterior Nasal Swab  Result Value Ref Range Status   SARS Coronavirus 2 by RT PCR NEGATIVE NEGATIVE Final    Comment: (NOTE) SARS-CoV-2 target nucleic acids are NOT DETECTED.  The SARS-CoV-2 RNA is generally detectable in upper respiratory specimens during the acute phase of infection. The lowest concentration of SARS-CoV-2 viral copies this assay can detect is 138 copies/mL. A negative result does not preclude SARS-Cov-2 infection and should not be used as the sole basis for treatment or other patient management decisions. A negative result may occur with  improper specimen collection/handling, submission of specimen other than nasopharyngeal swab, presence of viral mutation(s) within the areas targeted by this assay, and inadequate number of viral copies(<138 copies/mL). A negative result must be combined with clinical observations, patient history, and epidemiological information. The expected result is Negative.  Fact Sheet for Patients:  BloggerCourse.com  Fact Sheet for Healthcare Providers:  SeriousBroker.it  This test is no t yet approved or cleared by the [place] FDA and  has been authorized for detection and/or diagnosis of SARS-CoV-2 by FDA under an Emergency Use Authorization (EUA). This EUA will remain  in effect (meaning this test can be used) for the duration of the COVID-19 declaration under Section 564(b)(1) of the Act, 21 U.S.C.section 360bbb-3(b)(1), unless the authorization is terminated  or revoked sooner.       Influenza A by PCR NEGATIVE NEGATIVE Final   Influenza B by  PCR NEGATIVE NEGATIVE Final    Comment: (NOTE) The Xpert Xpress SARS-CoV-2/FLU/RSV plus assay is intended as an aid in the diagnosis of influenza from Nasopharyngeal swab specimens and should not be used as a sole basis for treatment. Nasal washings and aspirates are unacceptable for Xpert Xpress SARS-CoV-2/FLU/RSV testing.  Fact Sheet for Patients: BloggerCourse.com  Fact Sheet for Healthcare Providers: SeriousBroker.it  This test is not yet approved or cleared by the [place] FDA  and has been authorized for detection and/or diagnosis of SARS-CoV-2 by FDA under an Emergency Use Authorization (EUA). This EUA will remain in effect (meaning this test can be used) for the duration of the COVID-19 declaration under Section 564(b)(1) of the Act, 21 U.S.C. section 360bbb-3(b)(1), unless the authorization is terminated or revoked.     Resp Syncytial Virus by PCR NEGATIVE NEGATIVE Final    Comment: (NOTE) Fact Sheet for Patients: BloggerCourse.com  Fact Sheet for Healthcare Providers: SeriousBroker.it  This test is not yet approved or cleared by the [place] FDA and has been authorized for detection and/or diagnosis of SARS-CoV-2 by FDA under an Emergency Use Authorization (EUA). This EUA will remain in effect (meaning this test can be used) for the duration of the COVID-19 declaration under Section 564(b)(1) of the Act, 21 U.S.C. section 360bbb-3(b)(1), unless the authorization is terminated or revoked.  Performed at [facility], [address] [place], [place] [address]   MRSA Next Gen by PCR, Nasal     Status: None   Collection Time: 06/26/23  6:04 PM   Specimen: Nasal Mucosa; Nasal Swab  Result Value Ref Range Status   MRSA by PCR Next Gen NOT DETECTED NOT DETECTED Final    Comment: (NOTE) The GeneXpert MRSA Assay (FDA approved for NASAL specimens only), is one  component of a comprehensive MRSA colonization surveillance program. It is not intended to diagnose MRSA infection nor to guide or monitor treatment for MRSA infections. Test performance is not FDA approved in patients less than 31 years old. Performed at [facility], [address] [place], [place] [address]     Radiology Studies: CT Angio Chest PE W and/or Wo Contrast Result Date: 06/25/2023 CLINICAL DATA:  Hypoxia.  Recently status post spine surgery EXAM: CT ANGIOGRAPHY CHEST WITH CONTRAST TECHNIQUE: Multidetector CT imaging of the chest was performed using the standard protocol during bolus administration of intravenous contrast. Multiplanar CT image reconstructions and MIPs were obtained to evaluate the vascular anatomy. RADIATION DOSE REDUCTION: This exam was performed according to the departmental dose-optimization program which includes automated exposure control, adjustment of the mA and/or kV according to patient size and/or use of iterative reconstruction technique. CONTRAST:  75mL OMNIPAQUE IOHEXOL 350 MG/ML SOLN COMPARISON:  Same day chest radiograph, thoracic spine MRI dated 05/28/2023 FINDINGS: Decreased sensitivity and specificity for detailed findings due to motion artifact. Cardiovascular: Right chest wall port tip terminates at the superior cavoatrial junction. Left chest wall pacemaker leads terminate in the right atrium and ventricle. The study is adequate for the evaluation of pulmonary embolism to the level of the proximal segmental arteries. There are no filling defects in the central, lobar, proximal segmental segmental pulmonary artery branches to suggest acute pulmonary embolism. Great vessels are normal in course and caliber. Multichamber cardiomegaly. No significant pericardial fluid/thickening. Coronary artery calcifications and aortic atherosclerosis. Mediastinum/Nodes: Imaged thyroid gland without nodules meeting criteria for imaging follow-up by size. Normal esophagus. No  pathologically enlarged axillary, supraclavicular, mediastinal, or hilar lymph nodes. Lungs/Pleura: The central airways are patent. Subsegmental mucous plugging in the lingula and left lower lobe. Bilateral lower lobe relaxation atelectasis. No pneumothorax. Moderate bilateral pleural effusions, increased compared to 05/28/2023. Upper abdomen: Normal. Musculoskeletal: No acute or abnormal lytic or blastic osseous lesions. Postsurgical changes of T10-12. Small foci of gas within the T11-12 intervertebral disc space. Review of the MIP images confirms the above findings. IMPRESSION: 1. Decreased sensitivity and specificity for detailed findings due to motion artifact. No evidence of pulmonary embolism  to the level of the proximal segmental arteries. 2. Moderate bilateral pleural effusions, increased compared to 05/28/2023. 3. Multichamber cardiomegaly. 4. Gas within the T11-12 intervertebral disc space, which may be postsurgical. 5. Aortic Atherosclerosis (ICD10-I70.0). Coronary artery calcifications. Assessment for potential risk factor modification, dietary therapy or pharmacologic therapy may be warranted, if clinically indicated. Electronically Signed   By: Agustin Cree M.D.   On: 06/25/2023 14:56   DG Chest Port 1 View Result Date: 06/25/2023 CLINICAL DATA:  Hypoxia EXAM: PORTABLE CHEST 1 VIEW COMPARISON:  Chest radiograph 06/04/2023 FINDINGS: Port-A-Cath tip projects over the superior vena cava. Dual lead pacer apparatus overlies the left hemithorax. Stable cardiomegaly. Similar bilateral lower lung heterogeneous opacities. No definite large pleural effusion or pneumothorax. Thoracic spine degenerative changes. Spinal fusion hardware. IMPRESSION: Similar bilateral lower lung heterogeneous opacities which may represent atelectasis or infection. Electronically Signed   By: Annia Belt M.D.   On: 06/25/2023 12:17   Scheduled Meds:  Chlorhexidine Gluconate Cloth  6 each Topical Daily   fludrocortisone  0.1 mg  Oral Daily   [START ON 06/27/2023] hydrocortisone sod succinate (SOLU-CORTEF) inj  50 mg Intravenous Q8H   levothyroxine  100 mcg Oral QAC breakfast   midodrine  10 mg Oral TID WC   pantoprazole (PROTONIX) IV  40 mg Intravenous Q12H   pregabalin  200 mg Oral BID   rosuvastatin  5 mg Oral QHS   senna-docusate  2 tablet Oral BID   valACYclovir  1,000 mg Oral Daily   Continuous Infusions:  sodium chloride     dextrose 5 % and 0.45 % NaCl with KCl 20 mEq/L 100 mL/hr at 06/26/23 1845   norepinephrine (LEVOPHED) Adult infusion 2 mcg/min (06/26/23 1845)     LOS: 1 day    Shon Hale M.D on 06/26/2023 at 9:44 PM  Go to [email or web address] - for contact info  [facility] - Office  [phone]  If 7PM-7AM, please contact night-coverage [email or web address] 06/26/2023, 9:44 PM

## 2023-06-26 NOTE — Progress Notes (Addendum)
   06/26/23 1359  TOC Brief Assessment  Insurance and Status Reviewed  Patient has primary care physician Yes  Home environment has been reviewed from home  Prior level of function: independent  Prior/Current Home Services No current home services  Social Drivers of Health Review SDOH reviewed no interventions necessary  Readmission risk has been reviewed Yes  Transition of care needs no transition of care needs at this time    Reviewed pt's record. No immediate TOC needs identified. Pt currently on O2 which he is not on at home.   Educational information regarding CHF added to AVS.   Will follow and assist if dc needs arise.

## 2023-06-27 DIAGNOSIS — J9601 Acute respiratory failure with hypoxia: Secondary | ICD-10-CM | POA: Diagnosis not present

## 2023-06-27 DIAGNOSIS — K921 Melena: Secondary | ICD-10-CM | POA: Diagnosis not present

## 2023-06-27 LAB — BASIC METABOLIC PANEL
Anion gap: 7 (ref 5–15)
BUN: 19 mg/dL (ref 8–23)
CO2: 36 mmol/L — ABNORMAL HIGH (ref 22–32)
Calcium: 8.7 mg/dL — ABNORMAL LOW (ref 8.9–10.3)
Chloride: 95 mmol/L — ABNORMAL LOW (ref 98–111)
Creatinine, Ser: 0.62 mg/dL (ref 0.61–1.24)
GFR, Estimated: >60 mL/min (ref >60)
Glucose, Bld: 157 mg/dL — ABNORMAL HIGH (ref 70–99)
Potassium: 5 mmol/L (ref 3.5–5.1)
Sodium: 138 mmol/L (ref 135–145)

## 2023-06-27 LAB — GLUCOSE, CAPILLARY
Glucose-Capillary: 107 mg/dL — ABNORMAL HIGH (ref 70–99)
Glucose-Capillary: 113 mg/dL — ABNORMAL HIGH (ref 70–99)
Glucose-Capillary: 118 mg/dL — ABNORMAL HIGH (ref 70–99)
Glucose-Capillary: 118 mg/dL — ABNORMAL HIGH (ref 70–99)
Glucose-Capillary: 135 mg/dL — ABNORMAL HIGH (ref 70–99)
Glucose-Capillary: 147 mg/dL — ABNORMAL HIGH (ref 70–99)
Glucose-Capillary: 175 mg/dL — ABNORMAL HIGH (ref 70–99)

## 2023-06-27 LAB — CBC
HCT: 44 % (ref 39.0–52.0)
Hemoglobin: 13.8 g/dL (ref 13.0–17.0)
MCH: 31.1 pg (ref 26.0–34.0)
MCHC: 31.4 g/dL (ref 30.0–36.0)
MCV: 99.1 fL (ref 80.0–100.0)
Platelets: 227 10*3/uL (ref 150–400)
RBC: 4.44 MIL/uL (ref 4.22–5.81)
RDW: 14.9 % (ref 11.5–15.5)
WBC: 11.3 10*3/uL — ABNORMAL HIGH (ref 4.0–10.5)
nRBC: 0.2 % (ref 0.0–0.2)

## 2023-06-27 LAB — CORTISOL: Cortisol, Plasma: 5.5 ug/dL

## 2023-06-27 MED ORDER — FUROSEMIDE 10 MG/ML IJ SOLN
20.0000 mg | Freq: Every day | INTRAMUSCULAR | Status: DC
  Administered 2023-06-27 – 2023-06-29 (×3): 20 mg via INTRAVENOUS
  Filled 2023-06-27 (×3): qty 2

## 2023-06-27 NOTE — Plan of Care (Signed)

## 2023-06-27 NOTE — Progress Notes (Signed)
Gastroenterology Progress Note   Referring Provider: No ref. provider found Primary Care Physician:  Rebecka Apley, NP Primary Gastroenterologist:  previously unassigned (Dr. Jena Gauss)  Patient ID: Roy [name]; [id number]; 12-26-1950   Subjective:    Breathing okay. No abdominal pain. NPO for possible EGD. States he stopped iron for one week at home and his dark stools resolved. He denies brbpr. States he was having significant nosebleeds at home. Presented to the ED originially for epistaxis and SOB.  Objective:   Vital signs in last 24 hours: Temp:  [97.4 F (36.3 C)-98.3 F (36.8 C)] 97.8 F (36.6 C) (02/20 0720) Pulse Rate:  [59-78] 60 (02/20 1000) Resp:  [13-30] 29 (02/20 1015) BP: (73-126)/(46-98) 114/74 (02/20 1015) SpO2:  [87 %-100 %] 96 % (02/20 1015) Weight:  [127.2 kg-129.2 kg] 127.2 kg (02/20 0800)   General:   Alert,  Well-developed, well-nourished, pleasant and cooperative in NAD. [place] with oxygen at 4L Head:  Normocephalic and atraumatic. Eyes:  Sclera clear, no icterus.  Chest: CTA bilaterally without rales, rhonchi, crackles.    Heart:  Regular rate and rhythm; no murmurs, clicks, rubs,  or gallops. Abdomen:  Soft, nontender and nondistended. No masses, hepatosplenomegaly or hernias noted.  Extremities:  1+ bilateral edema. Without clubbing, deformity. Neurologic:  Alert and  oriented x4;  grossly normal neurologically. Skin:  Intact without significant lesions or rashes. Psych:  Alert and cooperative. Normal mood and affect.  Intake/Output from previous day: 02/19 0701 - 02/20 0700 In: 517.3 [I.V.:517.3] Out: 1150 [Urine:1150] Intake/Output this shift: No intake/output data recorded.  Lab Results: CBC Recent Labs    06/25/23 1014 06/25/23 2223 06/26/23 0507 06/26/23 1517 06/27/23 0408  WBC 8.6  --  9.9  --  11.3*  HGB 14.2   < > 14.7 13.8 13.8  HCT 45.3   < > 47.2 44.3 44.0  MCV 99.6  --  99.4  --  99.1  PLT 222  --  215  --  227   <  > = values in this interval not displayed.   BMET Recent Labs    06/25/23 1005 06/26/23 0507 06/27/23 0408  NA 136 141 138  K 3.0* 3.3* 5.0  CL 95* 95* 95*  CO2 33* 36* 36*  GLUCOSE 96 66* 157*  BUN 14 17 19   CREATININE 0.55* 0.57* 0.62  CALCIUM 8.6* 8.7* 8.7*   LFTs Recent Labs    06/25/23 1005  BILITOT 1.5*  ALKPHOS 149*  AST 22  ALT 20  PROT 5.7*  ALBUMIN 3.3*   No results for input(s): "LIPASE" in the last 72 hours. PT/INR Recent Labs    06/25/23 1005  LABPROT 15.9*  INR 1.2       Imaging Studies: CUP PACEART REMOTE DEVICE CHECK Result Date: 06/25/2023 Scheduled remote reviewed. Normal device function.  Known permanent AF, on OAC, good ventricular rate control Next remote 91 days. ML, CVRS  CT Angio Chest PE W and/or Wo Contrast Result Date: 06/25/2023 CLINICAL DATA:  Hypoxia.  Recently status post spine surgery EXAM: CT ANGIOGRAPHY CHEST WITH CONTRAST TECHNIQUE: Multidetector CT imaging of the chest was performed using the standard protocol during bolus administration of intravenous contrast. Multiplanar CT image reconstructions and MIPs were obtained to evaluate the vascular anatomy. RADIATION DOSE REDUCTION: This exam was performed according to the departmental dose-optimization program which includes automated exposure control, adjustment of the mA and/or kV according to patient size and/or use of iterative reconstruction technique. CONTRAST:  75mL OMNIPAQUE IOHEXOL  350 MG/ML SOLN COMPARISON:  Same day chest radiograph, thoracic spine MRI dated 05/28/2023 FINDINGS: Decreased sensitivity and specificity for detailed findings due to motion artifact. Cardiovascular: Right chest wall port tip terminates at the superior cavoatrial junction. Left chest wall pacemaker leads terminate in the right atrium and ventricle. The study is adequate for the evaluation of pulmonary embolism to the level of the proximal segmental arteries. There are no filling defects in the central,  lobar, proximal segmental segmental pulmonary artery branches to suggest acute pulmonary embolism. Great vessels are normal in course and caliber. Multichamber cardiomegaly. No significant pericardial fluid/thickening. Coronary artery calcifications and aortic atherosclerosis. Mediastinum/Nodes: Imaged thyroid gland without nodules meeting criteria for imaging follow-up by size. Normal esophagus. No pathologically enlarged axillary, supraclavicular, mediastinal, or hilar lymph nodes. Lungs/Pleura: The central airways are patent. Subsegmental mucous plugging in the lingula and left lower lobe. Bilateral lower lobe relaxation atelectasis. No pneumothorax. Moderate bilateral pleural effusions, increased compared to 05/28/2023. Upper abdomen: Normal. Musculoskeletal: No acute or abnormal lytic or blastic osseous lesions. Postsurgical changes of T10-12. Small foci of gas within the T11-12 intervertebral disc space. Review of the MIP images confirms the above findings. IMPRESSION: 1. Decreased sensitivity and specificity for detailed findings due to motion artifact. No evidence of pulmonary embolism to the level of the proximal segmental arteries. 2. Moderate bilateral pleural effusions, increased compared to 05/28/2023. 3. Multichamber cardiomegaly. 4. Gas within the T11-12 intervertebral disc space, which may be postsurgical. 5. Aortic Atherosclerosis (ICD10-I70.0). Coronary artery calcifications. Assessment for potential risk factor modification, dietary therapy or pharmacologic therapy may be warranted, if clinically indicated. Electronically Signed   By: Agustin Cree M.D.   On: 06/25/2023 14:56   DG Chest Port 1 View Result Date: 06/25/2023 CLINICAL DATA:  Hypoxia EXAM: PORTABLE CHEST 1 VIEW COMPARISON:  Chest radiograph 06/04/2023 FINDINGS: Port-A-Cath tip projects over the superior vena cava. Dual lead pacer apparatus overlies the left hemithorax. Stable cardiomegaly. Similar bilateral lower lung heterogeneous  opacities. No definite large pleural effusion or pneumothorax. Thoracic spine degenerative changes. Spinal fusion hardware. IMPRESSION: Similar bilateral lower lung heterogeneous opacities which may represent atelectasis or infection. Electronically Signed   By: Annia Belt M.D.   On: 06/25/2023 12:17   DG Abd Portable 1V Result Date: 06/05/2023 CLINICAL DATA:  Ileus EXAM: PORTABLE ABDOMEN - 1 VIEW COMPARISON:  Yesterday FINDINGS: Diffuse gaseous distension of small and large bowel. No evidence of pneumatosis or progression. No concerning mass effect or calcification. There is thoracolumbar spine ankylosis and fusion hardware. IMPRESSION: Unchanged generalized ileus pattern. Electronically Signed   By: Tiburcio Pea M.D.   On: 06/05/2023 08:54   DG Abd 1 View Result Date: 06/04/2023 CLINICAL DATA:  NG tube advancement. EXAM: ABDOMEN - 1 VIEW COMPARISON:  Earlier today FINDINGS: The enteric tube has been advanced, the tip and side port are below the diaphragm in the stomach. No gaseous bowel distention in the included upper abdomen. Right upper quadrant surgical clips. IMPRESSION: Enteric tube tip and side port below the diaphragm in the stomach. Electronically Signed   By: Narda Rutherford M.D.   On: 06/04/2023 16:30   DG Abd 1 View Result Date: 06/04/2023 CLINICAL DATA:  Feeding tube placement. EXAM: ABDOMEN - 1 VIEW COMPARISON:  Abdominal radiograph dated 06/04/2023. FINDINGS: Partially visualized enteric tube with poorly visualized tip over the mediastinum, likely in mid to distal esophagus. Recommend further advancing and repeat radiograph. IMPRESSION: Enteric tube with tip over the mediastinum. Recommend further advancing and repeat radiograph. Electronically Signed  By: Elgie Collard M.D.   On: 06/04/2023 15:19   DG Abd Portable 1V Result Date: 06/04/2023 CLINICAL DATA:  Abdominal distension. EXAM: PORTABLE ABDOMEN - 1 VIEW COMPARISON:  Radiograph 06/02/2023 FINDINGS: Mild gaseous gastric  distension. Slight increasing gaseous small and large bowel distension. Right upper quadrant surgical clips. Thoracolumbar and left hip hardware. IMPRESSION: Gaseous bowel distension slightly worsened over the last 2 days, suggestive of ileus. Electronically Signed   By: Narda Rutherford M.D.   On: 06/04/2023 12:08   DG CHEST PORT 1 VIEW Result Date: 06/04/2023 CLINICAL DATA:  Dyspnea. EXAM: PORTABLE CHEST 1 VIEW COMPARISON:  05/24/2023 FINDINGS: Left-sided pacemaker and right chest port remain in place. Anti lordotic positioning. Development of hazy bibasilar opacities which may represent atelectasis, effusion, or combination there of. Stable heart size and mediastinal contours. No pneumothorax. No pulmonary edema. IMPRESSION: Development of hazy bibasilar opacities which may represent atelectasis, effusion, or combination there of. Electronically Signed   By: Narda Rutherford M.D.   On: 06/04/2023 12:07   DG Abd Portable 1V Result Date: 06/02/2023 CLINICAL DATA:  Abdominal distention. EXAM: PORTABLE ABDOMEN - 1 VIEW COMPARISON:  None Available. FINDINGS: Mild diffuse gaseous distention of small bowel and colon is seen, consistent with ileus. IMPRESSION: Ileus pattern. Electronically Signed   By: Danae Orleans M.D.   On: 06/02/2023 14:06  [2 weeks]  Assessment/Plan:   RAYANSH HERBST is a 73 y.o. year old male with medical history of atrial fibrillation maintained on Eliquis, diffuse large B-cell lymphoma, GERD, high cholesterol, hypertension who presented to the ER with epistaxis and shortness of breath.  Patient had large bloody melanotic stool while in the ED, GI consulted for further evaluation    Melena/hemoccult positive stool: -chronically dark stools on iron, clears with discontinuing iron -large melanotic stool in ED, hemoccult positive, with large volume epistaxis prior to admission, one of his reasons for presenting (along with shortness of breath) which may explain his melena (due to  ingested blood) but would consider EGD prior to discharge due to need for ongoing anticoagulation -would hold off on EGD today given recent needs for pressors, weaned off this morning, and CHF with ongoing oxygen needs, no rush with normal Hgb -heart healthy diet today, NPO after midnight -reassess in AM -continue PPI BID    LOS: 2 days   Leanna Battles. [address] [phone] 2/20/202510:37 AM

## 2023-06-27 NOTE — Progress Notes (Signed)
PROGRESS NOTE   Roy [name], is a 73 y.o. male, DOB - 10-08-1950, ZOX:[id number]  Admit date - 06/25/2023   Admitting Physician Onnie Boer, MD  Outpatient Primary MD for the patient is Hemberg, Ruby Cola, NP  LOS - 2  Chief Complaint  Patient presents with   Epistaxis   Respiratory Distress       Brief Narrative:    73 y.o. male with medical history significant for atrial fibrillation on chronic anticoagulation with Eliquis, adrenal insufficiency, CHF, admitted on 06/25/2023 with acute hypoxic respiratory failure in the setting of nosebleeds and concerns about possible GI bleed, on 06/26/2023 patient was noted to be persistently hypotensive, subsequently transferred to stepdown unit for possible pressor support after failing IV fluid boluses.     -Assessment and Plan: 1) Acute hypoxic respiratory failure -suspect CHF related please see #5 below -CTA chest without acute PE (was on Eliquis PTA) -COVID, influenza and RSV negative -Unable to wean off oxygen currently requiring 4 L of oxygen via nasal cannula 06/27/23 -BP has improved,  currently weaning off IV Levophed --- -BP has improved--- weaning of IV Levophed -Gentle IV Lasix diuresis as BP allows  2)GI bleed Large volume melanotic stool , Hemoccult positive.   On anticoagulation with Eliquis. -??  Homero Fellers GI bleed versus Hemoccult positive melanotic stools due to nosebleeds with passage of bleeding from epistasis into the GI tract compounded by ongoing iron therapy --GI consult appreciated possible EGD on 06/28/2023 if hemodynamically stable and stable from respiratory standpoint Hgb overall stable after initially dropping from hemodilution most likely -c/n iv Protonix   3)Persistent hypotension--- received IV fluid boluses...  -Some drop in hemoglobin most likely due to hemodilution rather than brisk bleeding -Suspect some component of adrenal insufficiency -Transferred to stepdown/ICU on 06/26/23 for pressor  support with IV Levophed--- maintain MAP above 65 mmHg --weaning off IV Levophed on 06/27/2023 -Continue IV hydrocortisone through 06/28/2023  4)Episode of hypoglycemia--- okay to hold IV dextrose solution as oral intake is resuming  5)Acute on chronic diastolic CHF (congestive heart failure) (HCC) CTA chest shows increasing moderate bilateral pleural effusions, BNP elevated at 461, baseline 100s to 200.  No peripheral signs of edema my evaluation, per charts no significant weight gain.  Last echo 09/2022 EF of 60 to 65%, with LVH. -CTA chest without pneumonia, and clinical picture not consistent with pneumonia no further antibiotics needed -CTA chest with multichamber cardiomegaly and worsening bilateral pleural effusions -Initially received IV Lasix however no further Lasix for now due to persistent hypotension requiring pressor support -BNP is 461 which is higher than prior -Troponin 32 > 36 06/27/23 --- -BP has improved--- weaning of IV Levophed -Gentle IV Lasix diuresis as BP allows  6)Atrial fibrillation (HCC) Rate controlled and on anticoagulation with Eliquis.  L -Eliquis on hold since 06/24/2023 -- On admission patient had spontaneous nosebleeds-controlled with pressure and Afrin.  7)Hypokalemia--normalized with replacement Mag 1.9.  8)Adrenal insufficiency (HCC) Blood pressure stable. -Prior to admission patient was on prednisone, Florinef and midodrine -Midodrine increased to 10 mg 3 times daily -06/27/23 -Random p.m. cortisol level was only 5.5-- Please see stress dose steroids as in  #3 above  9)Essential hypertension Now persistently hypotensive---please see #3 above -   CRITICAL CARE Performed by: Shon Hale   Total critical care time: 41 minutes  Critical care time was exclusive of separately billable procedures and treating other patients.  Critical care was necessary to treat or prevent imminent or life-threatening deterioration. - Persistent  hypotension  and hemodynamic instability requiring IV fluid boluses, IV hydrocortisone and IV Levophed for pressure support-- --weaning off IV Levophed on 06/27/2023 -Continue IV hydrocortisone through 06/28/2023  Critical care was time spent personally by me on the following activities: development of treatment plan with patient and/or surrogate as well as nursing, discussions with consultants, evaluation of patient's response to treatment, examination of patient, obtaining history from patient or surrogate, ordering and performing treatments and interventions, ordering and review of laboratory studies, ordering and review of radiographic studies, pulse oximetry and re-evaluation of patient's condition.  Status is: Inpatient   Disposition: The patient is from: Home              Anticipated d/c is to: Home              Anticipated d/c date is: 2 days              Patient currently is not medically stable to d/c. Barriers: Not Clinically Stable-   Code Status :  -  Code Status: Full Code   Family Communication:    NA (patient is alert, awake and coherent)   DVT Prophylaxis  :   - SCDs/GI bleed/ Place and maintain sequential compression device Start: 06/25/23 2157   Lab Results  Component Value Date   PLT 227 06/27/2023    Inpatient Medications  Scheduled Meds:  Chlorhexidine Gluconate Cloth  6 each Topical Daily   hydrocortisone sod succinate (SOLU-CORTEF) inj  50 mg Intravenous Q8H   levothyroxine  100 mcg Oral QAC breakfast   midodrine  10 mg Oral TID WC   pantoprazole (PROTONIX) IV  40 mg Intravenous Q12H   pregabalin  200 mg Oral BID   rosuvastatin  5 mg Oral QHS   senna-docusate  2 tablet Oral BID   valACYclovir  1,000 mg Oral Daily   Continuous Infusions:  sodium chloride     norepinephrine (LEVOPHED) Adult infusion 2 mcg/min (06/26/23 1845)   PRN Meds:.acetaminophen **OR** acetaminophen, polyethylene glycol, sodium chloride   Anti-infectives (From admission, onward)     Start     Dose/Rate Route Frequency Ordered Stop   06/25/23 1745  valACYclovir (VALTREX) tablet 1,000 mg        1,000 mg Oral Daily 06/25/23 1740     06/25/23 1300  cefTRIAXone (ROCEPHIN) 1 g in sodium chloride 0.9 % 100 mL IVPB        1 g 200 mL/hr over 30 Minutes Intravenous  Once 06/25/23 1256 06/25/23 1452   06/25/23 1300  azithromycin (ZITHROMAX) 500 mg in sodium chloride 0.9 % 250 mL IVPB        500 mg 250 mL/hr over 60 Minutes Intravenous  Once 06/25/23 1256 06/25/23 1452       Subjective: [name] today has no fevers, no emesis,   -Weaning off IV Levophed -Hypoxia persist -No further GI bleed concerns  Objective: Vitals:   06/27/23 0930 06/27/23 0945 06/27/23 1000 06/27/23 1015  BP: (!) 91/48  (!) 103/54 114/74  Pulse:   60   Resp: (!) 21  (!) 23 (!) 29  Temp:      TempSrc:      SpO2: 97% 97% 97% 96%  Weight:      Height:        Intake/Output Summary (Last 24 hours) at 06/27/2023 1122 Last data filed at 06/27/2023 1051 Gross per 24 hour  Intake 517.25 ml  Output 1300 ml  Net -782.75 ml   Filed Weights   06/26/23  1800 06/27/23 0311 06/27/23 0800  Weight: 129.2 kg 127.2 kg 127.2 kg    Physical Exam  Gen:- Awake Alert, no acute distress, speaking in sentences HEENT:- [place].AT, No sclera icterus, subtle left eye ptosis, not new Nose---3L/min Neck-Supple Neck,No JVD,.  Lungs-improving air movement, no wheezing  CV- S1, S2 normal, regular  Abd-  +ve B.Sounds, Abd Soft, No tenderness, increased truncal adiposity    Extremity/Skin:- No  edema, pedal pulses present  Psych-affect is appropriate, oriented x3 Neuro-generalized weakness no new focal deficits, no tremors  Data Reviewed: I have personally reviewed following labs and imaging studies  CBC: Recent Labs  Lab 06/25/23 1014 06/25/23 2223 06/26/23 0507 06/26/23 1517 06/27/23 0408  WBC 8.6  --  9.9  --  11.3*  NEUTROABS 6.5  --   --   --   --   HGB 14.2 14.7 14.7 13.8 13.8  HCT 45.3 45.8  47.2 44.3 44.0  MCV 99.6  --  99.4  --  99.1  PLT 222  --  215  --  227   Basic Metabolic Panel: Recent Labs  Lab 06/25/23 1005 06/26/23 0507 06/27/23 0408  NA 136 141 138  K 3.0* 3.3* 5.0  CL 95* 95* 95*  CO2 33* 36* 36*  GLUCOSE 96 66* 157*  BUN 14 17 19   CREATININE 0.55* 0.57* 0.62  CALCIUM 8.6* 8.7* 8.7*  MG 1.9  --   --    GFR: Estimated Creatinine Clearance: 113.3 mL/min (by C-G formula based on SCr of 0.62 mg/dL). Liver Function Tests: Recent Labs  Lab 06/25/23 1005  AST 22  ALT 20  ALKPHOS 149*  BILITOT 1.5*  PROT 5.7*  ALBUMIN 3.3*    Recent Results (from the past 240 hours)  Resp panel by RT-PCR (RSV, Flu A&B, Covid) Anterior Nasal Swab     Status: None   Collection Time: 06/25/23  9:41 AM   Specimen: Anterior Nasal Swab  Result Value Ref Range Status   SARS Coronavirus 2 by RT PCR NEGATIVE NEGATIVE Final    Comment: (NOTE) SARS-CoV-2 target nucleic acids are NOT DETECTED.  The SARS-CoV-2 RNA is generally detectable in upper respiratory specimens during the acute phase of infection. The lowest concentration of SARS-CoV-2 viral copies this assay can detect is 138 copies/mL. A negative result does not preclude SARS-Cov-2 infection and should not be used as the sole basis for treatment or other patient management decisions. A negative result may occur with  improper specimen collection/handling, submission of specimen other than nasopharyngeal swab, presence of viral mutation(s) within the areas targeted by this assay, and inadequate number of viral copies(<138 copies/mL). A negative result must be combined with clinical observations, patient history, and epidemiological information. The expected result is Negative.  Fact Sheet for Patients:  BloggerCourse.com  Fact Sheet for Healthcare Providers:  SeriousBroker.it  This test is no t yet approved or cleared by the [place] FDA and  has been  authorized for detection and/or diagnosis of SARS-CoV-2 by FDA under an Emergency Use Authorization (EUA). This EUA will remain  in effect (meaning this test can be used) for the duration of the COVID-19 declaration under Section 564(b)(1) of the Act, 21 U.S.C.section 360bbb-3(b)(1), unless the authorization is terminated  or revoked sooner.       Influenza A by PCR NEGATIVE NEGATIVE Final   Influenza B by PCR NEGATIVE NEGATIVE Final    Comment: (NOTE) The Xpert Xpress SARS-CoV-2/FLU/RSV plus assay is intended as an aid in the diagnosis of  influenza from Nasopharyngeal swab specimens and should not be used as a sole basis for treatment. Nasal washings and aspirates are unacceptable for Xpert Xpress SARS-CoV-2/FLU/RSV testing.  Fact Sheet for Patients: BloggerCourse.com  Fact Sheet for Healthcare Providers: SeriousBroker.it  This test is not yet approved or cleared by the [place] FDA and has been authorized for detection and/or diagnosis of SARS-CoV-2 by FDA under an Emergency Use Authorization (EUA). This EUA will remain in effect (meaning this test can be used) for the duration of the COVID-19 declaration under Section 564(b)(1) of the Act, 21 U.S.C. section 360bbb-3(b)(1), unless the authorization is terminated or revoked.     Resp Syncytial Virus by PCR NEGATIVE NEGATIVE Final    Comment: (NOTE) Fact Sheet for Patients: BloggerCourse.com  Fact Sheet for Healthcare Providers: SeriousBroker.it  This test is not yet approved or cleared by the [place] FDA and has been authorized for detection and/or diagnosis of SARS-CoV-2 by FDA under an Emergency Use Authorization (EUA). This EUA will remain in effect (meaning this test can be used) for the duration of the COVID-19 declaration under Section 564(b)(1) of the Act, 21 U.S.C. section 360bbb-3(b)(1), unless the  authorization is terminated or revoked.  Performed at [facility], [address] [place], [place] [address]   MRSA Next Gen by PCR, Nasal     Status: None   Collection Time: 06/26/23  6:04 PM   Specimen: Nasal Mucosa; Nasal Swab  Result Value Ref Range Status   MRSA by PCR Next Gen NOT DETECTED NOT DETECTED Final    Comment: (NOTE) The GeneXpert MRSA Assay (FDA approved for NASAL specimens only), is one component of a comprehensive MRSA colonization surveillance program. It is not intended to diagnose MRSA infection nor to guide or monitor treatment for MRSA infections. Test performance is not FDA approved in patients less than 65 years old. Performed at [facility], [address], [place], [place] [address]     Radiology Studies: CT Angio Chest PE W and/or Wo Contrast Result Date: 06/25/2023 CLINICAL DATA:  Hypoxia.  Recently status post spine surgery EXAM: CT ANGIOGRAPHY CHEST WITH CONTRAST TECHNIQUE: Multidetector CT imaging of the chest was performed using the standard protocol during bolus administration of intravenous contrast. Multiplanar CT image reconstructions and MIPs were obtained to evaluate the vascular anatomy. RADIATION DOSE REDUCTION: This exam was performed according to the departmental dose-optimization program which includes automated exposure control, adjustment of the mA and/or kV according to patient size and/or use of iterative reconstruction technique. CONTRAST:  75mL OMNIPAQUE IOHEXOL 350 MG/ML SOLN COMPARISON:  Same day chest radiograph, thoracic spine MRI dated 05/28/2023 FINDINGS: Decreased sensitivity and specificity for detailed findings due to motion artifact. Cardiovascular: Right chest wall port tip terminates at the superior cavoatrial junction. Left chest wall pacemaker leads terminate in the right atrium and ventricle. The study is adequate for the evaluation of pulmonary embolism to the level of the proximal segmental arteries. There are no filling  defects in the central, lobar, proximal segmental segmental pulmonary artery branches to suggest acute pulmonary embolism. Great vessels are normal in course and caliber. Multichamber cardiomegaly. No significant pericardial fluid/thickening. Coronary artery calcifications and aortic atherosclerosis. Mediastinum/Nodes: Imaged thyroid gland without nodules meeting criteria for imaging follow-up by size. Normal esophagus. No pathologically enlarged axillary, supraclavicular, mediastinal, or hilar lymph nodes. Lungs/Pleura: The central airways are patent. Subsegmental mucous plugging in the lingula and left lower lobe. Bilateral lower lobe relaxation atelectasis. No pneumothorax. Moderate bilateral pleural effusions, increased compared to 05/28/2023.  Upper abdomen: Normal. Musculoskeletal: No acute or abnormal lytic or blastic osseous lesions. Postsurgical changes of T10-12. Small foci of gas within the T11-12 intervertebral disc space. Review of the MIP images confirms the above findings. IMPRESSION: 1. Decreased sensitivity and specificity for detailed findings due to motion artifact. No evidence of pulmonary embolism to the level of the proximal segmental arteries. 2. Moderate bilateral pleural effusions, increased compared to 05/28/2023. 3. Multichamber cardiomegaly. 4. Gas within the T11-12 intervertebral disc space, which may be postsurgical. 5. Aortic Atherosclerosis (ICD10-I70.0). Coronary artery calcifications. Assessment for potential risk factor modification, dietary therapy or pharmacologic therapy may be warranted, if clinically indicated. Electronically Signed   By: Agustin Cree M.D.   On: 06/25/2023 14:56   Scheduled Meds:  Chlorhexidine Gluconate Cloth  6 each Topical Daily   hydrocortisone sod succinate (SOLU-CORTEF) inj  50 mg Intravenous Q8H   levothyroxine  100 mcg Oral QAC breakfast   midodrine  10 mg Oral TID WC   pantoprazole (PROTONIX) IV  40 mg Intravenous Q12H   pregabalin  200 mg Oral  BID   rosuvastatin  5 mg Oral QHS   senna-docusate  2 tablet Oral BID   valACYclovir  1,000 mg Oral Daily   Continuous Infusions:  sodium chloride     norepinephrine (LEVOPHED) Adult infusion 2 mcg/min (06/26/23 1845)    LOS: 2 days   Shon Hale M.D on 06/27/2023 at 11:22 AM  Go to [email or web address] - for contact info  [facility] - Office  [phone]  If 7PM-7AM, please contact night-coverage [email or web address] 06/27/2023, 11:22 AM

## 2023-06-27 NOTE — Plan of Care (Signed)

## 2023-06-28 ENCOUNTER — Inpatient Hospital Stay (HOSPITAL_COMMUNITY): Payer: 59 | Admitting: Certified Registered"

## 2023-06-28 ENCOUNTER — Encounter (HOSPITAL_COMMUNITY)
Admission: EM | Disposition: A | Discharge status: Home Health | Payer: Self-pay | Source: Skilled Nursing Facility | Attending: Family Medicine

## 2023-06-28 ENCOUNTER — Encounter (HOSPITAL_COMMUNITY): Payer: Self-pay | Admitting: Internal Medicine

## 2023-06-28 ENCOUNTER — Inpatient Hospital Stay (HOSPITAL_COMMUNITY): Payer: 59

## 2023-06-28 DIAGNOSIS — I5023 Acute on chronic systolic (congestive) heart failure: Secondary | ICD-10-CM

## 2023-06-28 DIAGNOSIS — I110 Hypertensive heart disease with heart failure: Secondary | ICD-10-CM | POA: Diagnosis not present

## 2023-06-28 DIAGNOSIS — E785 Hyperlipidemia, unspecified: Secondary | ICD-10-CM | POA: Diagnosis not present

## 2023-06-28 DIAGNOSIS — K3189 Other diseases of stomach and duodenum: Secondary | ICD-10-CM

## 2023-06-28 DIAGNOSIS — R1310 Dysphagia, unspecified: Secondary | ICD-10-CM

## 2023-06-28 DIAGNOSIS — J9601 Acute respiratory failure with hypoxia: Secondary | ICD-10-CM | POA: Diagnosis not present

## 2023-06-28 HISTORY — PX: ESOPHAGOGASTRODUODENOSCOPY (EGD) WITH PROPOFOL: SHX5813

## 2023-06-28 HISTORY — PX: MALONEY DILATION: SHX5535

## 2023-06-28 HISTORY — PX: BIOPSY: SHX5522

## 2023-06-28 LAB — RENAL FUNCTION PANEL
Albumin: 3.3 g/dL — ABNORMAL LOW (ref 3.5–5.0)
Anion gap: 9 (ref 5–15)
BUN: 16 mg/dL (ref 8–23)
CO2: 37 mmol/L — ABNORMAL HIGH (ref 22–32)
Calcium: 9.6 mg/dL (ref 8.9–10.3)
Chloride: 94 mmol/L — ABNORMAL LOW (ref 98–111)
Creatinine, Ser: 0.68 mg/dL (ref 0.61–1.24)
GFR, Estimated: >60 mL/min (ref >60)
Glucose, Bld: 106 mg/dL — ABNORMAL HIGH (ref 70–99)
Phosphorus: 2.8 mg/dL (ref 2.5–4.6)
Potassium: 4.2 mmol/L (ref 3.5–5.1)
Sodium: 140 mmol/L (ref 135–145)

## 2023-06-28 LAB — HEMOGLOBIN AND HEMATOCRIT, BLOOD
HCT: 42.4 % (ref 39.0–52.0)
Hemoglobin: 13.5 g/dL (ref 13.0–17.0)

## 2023-06-28 LAB — GLUCOSE, CAPILLARY
Glucose-Capillary: 105 mg/dL — ABNORMAL HIGH (ref 70–99)
Glucose-Capillary: 118 mg/dL — ABNORMAL HIGH (ref 70–99)
Glucose-Capillary: 117 mg/dL — ABNORMAL HIGH (ref 70–99)
Glucose-Capillary: 115 mg/dL — ABNORMAL HIGH (ref 70–99)
Glucose-Capillary: 174 mg/dL — ABNORMAL HIGH (ref 70–99)

## 2023-06-28 SURGERY — ESOPHAGOGASTRODUODENOSCOPY (EGD) WITH PROPOFOL
Anesthesia: General

## 2023-06-28 MED ORDER — PROPOFOL 500 MG/50ML IV EMUL
INTRAVENOUS | Status: DC | PRN
  Administered 2023-06-28: 100 ug/kg/min via INTRAVENOUS

## 2023-06-28 MED ORDER — SODIUM CHLORIDE 0.9 % IV SOLN: INTRAVENOUS | Status: DC

## 2023-06-28 MED ORDER — LIDOCAINE HCL (PF) 2 % IJ SOLN
INTRAMUSCULAR | Status: DC | PRN
  Administered 2023-06-28: 100 mg via INTRADERMAL

## 2023-06-28 MED ORDER — PHENYLEPHRINE 80 MCG/ML (10ML) SYRINGE FOR IV PUSH (FOR BLOOD PRESSURE SUPPORT)
PREFILLED_SYRINGE | INTRAVENOUS | Status: AC
  Filled 2023-06-28: qty 10

## 2023-06-28 MED ORDER — PHENYLEPHRINE 80 MCG/ML (10ML) SYRINGE FOR IV PUSH (FOR BLOOD PRESSURE SUPPORT)
PREFILLED_SYRINGE | INTRAVENOUS | Status: DC | PRN
  Administered 2023-06-28 (×5): 160 ug via INTRAVENOUS

## 2023-06-28 MED ORDER — SODIUM CHLORIDE 0.9 % IV SOLN: INTRAVENOUS | Status: DC | PRN

## 2023-06-28 MED ORDER — LACTATED RINGERS IV SOLN: INTRAVENOUS | Status: DC

## 2023-06-28 MED ORDER — PROPOFOL 10 MG/ML IV BOLUS
INTRAVENOUS | Status: DC | PRN
  Administered 2023-06-28: 100 mg via INTRAVENOUS
  Administered 2023-06-28: 50 mg via INTRAVENOUS

## 2023-06-28 MED ORDER — LACTATED RINGERS IV SOLN: INTRAVENOUS | Status: DC | PRN

## 2023-06-28 NOTE — Transfer of Care (Signed)
Immediate Anesthesia Transfer of Care Note  Patient: Roy [name]  Procedure(s) Performed: ESOPHAGOGASTRODUODENOSCOPY (EGD) WITH PROPOFOL MALONEY DILATION BIOPSY  Patient Location: PACU  Anesthesia Type:General  Level of Consciousness: drowsy  Airway & Oxygen Therapy: Patient Spontanous Breathing  Post-op Assessment: Report given to RN and Post -op Vital signs reviewed and stable  Post vital signs: Reviewed and stable  Last Vitals:  Vitals Value Taken Time  BP 89/53 06/28/23 1656  Temp    Pulse 84 06/28/23 1657  Resp 20 06/28/23 1657  SpO2 98 % 06/28/23 1657  Vitals shown include unfiled device data.  Last Pain:  Vitals:   06/28/23 1631  TempSrc:   PainSc: 0-No pain      Patients Stated Pain Goal: 0 (06/27/23 1933)  Complications: No notable events documented.

## 2023-06-28 NOTE — Care Management Important Message (Signed)
Important Message  Patient Details  Name: Roy [name] MRN: [id number] Date of Birth: 1950-12-25   Important Message Given:  Yes - Medicare IM (copy left in room)     Corey Harold 06/28/2023, 4:20 PM

## 2023-06-28 NOTE — TOC Initial Note (Signed)
Transition of Care [facility]) - Initial/Assessment Note    Patient Details  Name: Roy [name] MRN: [id number] Date of Birth: 1950-10-03  Transition of Care [facility]) CM/SW Contact:    Karn Cassis, LCSW Phone Number: 06/28/2023, 7:59 AM  Clinical Narrative:  Pt admitted for acute hypoxic respiratory failure. Assessment completed due to high risk readmission score. Pt reports he lives alone and is independent with ADLs. He ambulates with a walker at baseline. No home health services prior to admission. Pt currently on 2L O2. TOC will monitor for home O2 needs at d/c. No other needs reported at this time.                   Expected Discharge Plan: Home/Self Care Barriers to Discharge: Continued Medical Work up   Patient Goals and CMS Choice Patient states their goals for this hospitalization and ongoing recovery are:: return home   Choice offered to / list presented to : Patient [place] ownership interest in [facility].provided to::  (n/a)    Expected Discharge Plan and Services In-house Referral: Clinical Social Work     Living arrangements for the past 2 months: Single Family Home                                      Prior Living Arrangements/Services Living arrangements for the past 2 months: Single Family Home Lives with:: Self Patient language and need for interpreter reviewed:: Yes Do you feel safe going back to the place where you live?: Yes      Need for Family Participation in Patient Care: No (Comment)   Current home services: DME (cane, walker) Criminal Activity/Legal Involvement Pertinent to Current Situation/Hospitalization: No - Comment as needed  Activities of Daily Living   ADL Screening (condition at time of admission) Independently performs ADLs?: No Does the patient have a NEW difficulty with bathing/dressing/toileting/self-feeding that is expected to last >3 days?: No Does the patient have a NEW difficulty with getting  in/out of bed, walking, or climbing stairs that is expected to last >3 days?: No Does the patient have a NEW difficulty with communication that is expected to last >3 days?: No Is the patient deaf or have difficulty hearing?: No Does the patient have difficulty seeing, even when wearing glasses/contacts?: No Does the patient have difficulty concentrating, remembering, or making decisions?: No  Permission Sought/Granted                  Emotional Assessment     Affect (typically observed): Appropriate Orientation: : Oriented to Self, Oriented to Place, Oriented to Situation Alcohol / Substance Use: Not Applicable Psych Involvement: No (comment)  Admission diagnosis:  Pleural effusion [J90] Acute respiratory failure with hypoxia (HCC) [J96.01] Acute on chronic congestive heart failure, unspecified heart failure type (HCC) [I50.9] Acute hypoxic respiratory failure (HCC) [J96.01] Patient Active Problem List   Diagnosis Date Noted   Acute hypoxic respiratory failure (HCC) 06/25/2023   Acute on chronic diastolic CHF (congestive heart failure) (HCC) 06/25/2023   GI bleed 06/25/2023   Benign hypertension with coincident congestive heart failure (HCC) 06/10/2023   Chronic constipation 06/10/2023   Vitamin B 12 deficiency 06/10/2023   Hypomagnesuria 06/10/2023   Peripheral neuropathy 06/10/2023   Aortic atherosclerosis (HCC) 06/10/2023   T11 vertebral fracture (HCC) 05/24/2023   Urge incontinence 11/05/2022   Symptomatic bradycardia 09/05/2022   Acute congestive heart failure (HCC)  09/05/2022   Parotid neoplasm 12/28/2021   Mononeuropathy of left lower limb 12/28/2021   Neutropenic fever (HCC) 07/14/2021   Hypothyroidism 07/14/2021   Hypotension 07/14/2021   Hyperlipidemia 07/14/2021   BPH (benign prostatic hyperplasia) 07/14/2021   GERD (gastroesophageal reflux disease) 07/14/2021   Leukopenia    Thrombocytopenia (HCC)    Large B-cell lymphoma (HCC) 03/22/2021   Varicose  veins of bilateral lower extremities with pain 02/02/2021   Venous (peripheral) insufficiency 02/02/2021   Subacute thyroiditis 01/20/2021   Adrenal insufficiency (HCC) 01/10/2021   Hyponatremia 01/08/2021   Weakness 01/08/2021   Nonrheumatic aortic valve stenosis 12/20/2020   Hyperthyroidism 10/22/2020   Coagulopathy (HCC) 10/21/2020   Obesity (BMI 30-39.9) 05/04/2015   Chronic anticoagulation 05/04/2015   Trifascicular block 04/29/2015   LAFB (left anterior fascicular block) 04/29/2015   Essential hypertension    Hypokalemia 04/28/2015   RBBB 04/28/2015   First degree AV block 04/28/2015   Atrial fibrillation (HCC) 04/27/2015   PCP:  Rebecka Apley, NP Pharmacy:   [facility] [place], [place] - [address] [place] [address] Phone: [phone] Fax: [phone]  [facility] - [place], [place] - [address] [place] [address] Phone: [phone] Fax: [phone]     Social Drivers of Health (SDOH) Social History: SDOH Screenings   Food Insecurity: No Food Insecurity (06/25/2023)  Housing: Low Risk  (06/25/2023)  Transportation Needs: No Transportation Needs (06/25/2023)  Utilities: Not At Risk (06/25/2023)  Alcohol Screen: Low Risk  (11/08/2020)  Depression (PHQ2-9): Low Risk  (11/08/2020)  Financial Resource Strain: Low Risk  (05/15/2023)   Received from [facility]  Physical Activity: Unknown (04/25/2023)   Received from [facility]  Recent Concern: Physical Activity - Inactive (04/25/2023)   Received from [facility]  Social Connections: Moderately Isolated (06/25/2023)  Stress: No Stress Concern Present (04/25/2023)   Received from [facility]  Tobacco Use: Low Risk  (06/25/2023)   SDOH Interventions:     Readmission Risk Interventions    06/28/2023    7:56 AM 07/17/2021   12:49 PM  Readmission Risk Prevention Plan  Transportation Screening Complete  Complete  HRI or Home Care Consult Complete Complete  Social Work Consult for Recovery Care Planning/Counseling Complete Complete  Palliative Care Screening Not Applicable Not Applicable  Medication Review Oceanographer) Complete Complete

## 2023-06-28 NOTE — Op Note (Addendum)
 [facility] Patient Name: Roy [name] Procedure Date: 06/28/2023 4:11 PM MRN: [id number] Date of Birth: Jun 04, 1950 Attending MD: Gennette Pac , MD, [id number] CSN: [id number] Age: 72 Admit Type: Inpatient Procedure:                Upper GI endoscopy Indications:              Dysphagia, Melena Providers:                Gennette Pac, MD, Crystal Page, Elinor Parkinson Referring MD:              Medicines:                Propofol per Anesthesia Complications:            No immediate complications. No immediate                            complications. Estimated blood loss: None. Estimated Blood Loss:     Estimated blood loss was minimal. Procedure:                Pre-Anesthesia Assessment:                           - Prior to the procedure, a History and Physical                            was performed, and patient medications and                            allergies were reviewed. The patient's tolerance of                            previous anesthesia was also reviewed. The risks                            and benefits of the procedure and the sedation                            options and risks were discussed with the patient.                            All questions were answered, and informed consent                            was obtained. Prior Anticoagulants: The patient has                            taken no anticoagulant or antiplatelet agents. ASA                            Grade Assessment: III - A patient with severe  systemic disease. After reviewing the risks and                            benefits, the patient was deemed in satisfactory                            condition to undergo the procedure.                           After obtaining informed consent, the endoscope was                            passed under direct vision. Throughout the                            procedure, the  patient's blood pressure, pulse, and                            oxygen saturations were monitored continuously. The                            [id number]-[id number] ([id number]) scope was introduced through the                            mouth, and advanced to the second part of duodenum.                            The upper GI endoscopy was accomplished without                            difficulty. The patient tolerated the procedure                            well. Scope In: 4:41:20 PM Scope Out: 4:47:23 PM Total Procedure Duration: 0 hours 6 minutes 3 seconds  Findings:      The examined esophagus was normal. Gastric cavity empty. Diffuse       mottling of the gastric mucosa. No ulcer or infiltrating process.       Pylorus patent.      The duodenal bulb and second portion of the duodenum were normal. The       scope was withdrawn. Dilation was performed with a Maloney dilator with       moderate resistance at 56 Fr. The dilation site was examined following       endoscope reinsertion and showed no change. Estimated blood loss was       minimal. Finally biopsies of the abnormal gastric mucosa were taken for       histologic study Impression:               - Normal esophagus. Dilated. Mottled appearing                            gastric mucosa of uncertain significance - status  post biopsy                           - Normal duodenal bulb and second portion of the                            duodenum.                           -No significant findings in his upper GI tract to                            explain melena. Clinically, he has remained quite                            stable throughout his hospitalization from a GI                            standpoint. If he had a GI bleed, it was relatively                            trivial. Hemoglobin remains normal. Epistaxis could                            have been a contributing factor to dark stool he                             reported. Moderate Sedation:      Moderate (conscious) sedation was personally administered by an       anesthesia professional. The following parameters were monitored: oxygen       saturation, heart rate, blood pressure, respiratory rate, EKG, adequacy       of pulmonary ventilation, and response to care. Recommendation:           - Return patient to hospital ward for ongoing care.                           - Advance diet as tolerated. If recurrent epistaxis                            would consider ENT consultation. Continue once                            daily PPI indefinitely. Follow-up on pathology.                            From a GI standpoint could resume anticoagulation                            tomorrow as clinically appropriate.                           At patient's request, I called Roy [name] at  [phone] - reviewed findings and                            recommendations. Questions answered. Procedure Code(s):        --- Professional ---                           [phone], Esophagogastroduodenoscopy, flexible,                            transoral; diagnostic, including collection of                            specimen(s) by brushing or washing, when performed                            (separate procedure)                           43450, Dilation of esophagus, by unguided sound or                            bougie, single or multiple passes Diagnosis Code(s):        --- Professional ---                           R13.10, Dysphagia, unspecified                           K92.1, Melena (includes Hematochezia) CPT copyright 2022 American Medical Association. All rights reserved. The codes documented in this report are preliminary and upon coder review may  be revised to meet current compliance requirements. [name]. [name], MD Gennette Pac, MD 06/28/2023 4:58:15 PM This report has been signed electronically. Number of Addenda:  0

## 2023-06-28 NOTE — Plan of Care (Signed)

## 2023-06-28 NOTE — Anesthesia Preprocedure Evaluation (Addendum)
Anesthesia Evaluation  Patient identified by MRN, date of birth, ID band Patient awake    Reviewed: Allergy & Precautions, H&P , NPO status , Patient's Chart, lab work & pertinent test results, reviewed documented beta blocker date and time   Airway Mallampati: III  TM Distance: >3 FB Neck ROM: full    Dental no notable dental hx. (+) Edentulous Upper, Edentulous Lower   Pulmonary neg pulmonary ROS  Left greater than right diminished.   + decreased breath sounds      Cardiovascular Exercise Tolerance: Good hypertension, +CHF  + dysrhythmias + pacemaker  Rhythm:regular Rate:Normal     Neuro/Psych  Neuromuscular disease  negative psych ROS   GI/Hepatic Neg liver ROS,GERD  Medicated,,  Endo/Other  Hypothyroidism Hyperthyroidism Class 3 obesity  Renal/GU negative Renal ROS  negative genitourinary   Musculoskeletal   Abdominal   Peds  Hematology negative hematology ROS (+)   Anesthesia Other Findings   Reproductive/Obstetrics negative OB ROS                             Anesthesia Physical Anesthesia Plan  ASA: 3  Anesthesia Plan: General   Post-op Pain Management:    Induction:   PONV Risk Score and Plan: Propofol infusion  Airway Management Planned:   Additional Equipment:   Intra-op Plan:   Post-operative Plan:   Informed Consent: I have reviewed the patients History and Physical, chart, labs and discussed the procedure including the risks, benefits and alternatives for the proposed anesthesia with the patient or authorized representative who has indicated his/her understanding and acceptance.     Dental Advisory Given  Plan Discussed with: CRNA  Anesthesia Plan Comments:        Anesthesia Quick Evaluation

## 2023-06-28 NOTE — Interval H&P Note (Signed)
History and Physical Interval Note:  06/28/2023 4:22 PM  Roy [name]  has presented today for surgery, with the diagnosis of melena, hemoccult positive stool, esophageal dysphagia.  The various methods of treatment have been discussed with the patient and family. After consideration of risks, benefits and other options for treatment, the patient has consented to  Procedure(s): ESOPHAGOGASTRODUODENOSCOPY (EGD) WITH PROPOFOL (N/A) ESOPHAGEAL DILATION (N/A) as a surgical intervention.  The patient's history has been reviewed, patient examined, no change in status, stable for surgery.  I have reviewed the patient's chart and labs.  Questions were answered to the patient's satisfaction.     Eula Listen    Patient seen and examined in short stay. This is a 73 year old male with A-fib on chronic anticoagulation history of diffuse B-cell lymphoma presented with shortness of breath , patient admitted with pneumonia .GI was consulted for reports of melena with positive FOBT in setting of epistaxis. Plan was to pursue EGD today but overnight patient was hypotensive and was transferred to ICU.    Patient now doing better and is stable for EGD.  He notes he has esophageal dysphagia would like his esophagus stretched again is feasible/appropriate per plan.  The risks, benefits, limitations, alternatives and imponderables have been reviewed with the patient. Potential for esophageal dilation, biopsy, etc. have also been reviewed.  Questions have been answered. All parties agreeable.

## 2023-06-28 NOTE — Progress Notes (Signed)
PROGRESS NOTE   Roy [name], is a 73 y.o. male, DOB - 1950/05/27, ZOX:[id number]  Admit date - 06/25/2023   Admitting Physician Onnie Boer, MD  Outpatient Primary MD for the patient is [name], Roy Cola, NP  LOS - 3  Chief Complaint  Patient presents with   Epistaxis   Respiratory Distress       Brief Narrative:    73 y.o. male with medical history significant for atrial fibrillation on chronic anticoagulation with Eliquis, adrenal insufficiency, CHF, admitted on 06/25/2023 with acute hypoxic respiratory failure in the setting of nosebleeds and concerns about possible GI bleed, on 06/26/2023 patient was noted to be persistently hypotensive, subsequently transferred to stepdown unit for possible pressor support after failing IV fluid boluses.     -Assessment and Plan: 1) Acute hypoxic respiratory failure -suspect CHF related please see #5 below -CTA chest without acute PE (was on Eliquis PTA) -COVID, influenza and RSV negative -Unable to wean off oxygen currently requiring 4 L of oxygen via nasal cannula 06/28/23 -BP has improved,   -No longer requiring Levophed -Continue gentle diuresis with Lasix -Repeat chest x-ray on 06/28/2023 with atelectasis and small pleural effusions  2)GI bleed Large volume melanotic stool , Hemoccult positive--may have been from epistaxis On anticoagulation with Eliquis. -Suspect Hemoccult positive melanotic stools due to nosebleeds with passage of bleeding from epistasis into the GI tract compounded by ongoing iron therapy --GI consult appreciated  - EGD on 06/28/2023 without significant upper GI findings to explain melena  Hgb overall stable after initially dropping from hemodilution most likely   3)Persistent hypotension--- received IV fluid boluses...  -Some drop in hemoglobin most likely due to hemodilution rather than brisk bleeding -Suspect some component of adrenal insufficiency -Transferred to stepdown/ICU on 06/26/23 for  pressor support with IV Levophed--- maintain MAP above 65 mmHg --weaned off IV Levophed on 06/27/2023 -Continue IV hydrocortisone through 06/28/2023, restart PTA Florinef, continue PTA midodrine  4)Episode of hypoglycemia--- encourage adequate oral intake  5)Acute on chronic diastolic CHF (congestive heart failure) (HCC) CTA chest shows increasing moderate bilateral pleural effusions, BNP elevated at 461, baseline 100s to 200.  No peripheral signs of edema my evaluation, per charts no significant weight gain.  Last echo 09/2022 EF of 60 to 65%, with LVH. -CTA chest without pneumonia, and clinical picture not consistent with pneumonia no further antibiotics needed -CTA chest with multichamber cardiomegaly and worsening bilateral pleural effusions -Initially received IV Lasix however no further Lasix for now due to persistent hypotension requiring pressor support -BNP is 461 which is higher than prior -Troponin 32 > 36 06/28/23 --- -BP has improved--- weaned off IV Levophed -Continue gentle diuresis with Lasix as BP allows  -Repeat chest x-ray on 06/28/2023 with atelectasis and small pleural effusions  6)Atrial fibrillation (HCC) Rate controlled and on anticoagulation with Eliquis.  L -Eliquis on hold since 06/24/2023 -- On admission patient had spontaneous nosebleeds-controlled with pressure and Afrin.  7)Hypokalemia--normalized with replacement Mag 1.9.  8)Adrenal insufficiency (HCC) Blood pressure stable. -Prior to admission patient was on prednisone, Florinef and midodrine -Midodrine increased to 10 mg 3 times daily -06/27/23 -Random p.m. cortisol level was only 5.5-- Please see stress dose steroids as in  #3 above  9)Essential hypertension ----please see #3 above - Status is: Inpatient   Disposition: The patient is from: Home              Anticipated d/c is to: Home  Anticipated d/c date is: 1 day              Patient currently is not medically stable to  d/c. Barriers: Not Clinically Stable-   Code Status :  -  Code Status: Full Code   Family Communication:    NA (patient is alert, awake and coherent)   DVT Prophylaxis  :   - SCDs/GI bleed/ Place and maintain sequential compression device Start: 06/25/23 2157   Lab Results  Component Value Date   PLT 227 06/27/2023   Inpatient Medications  Scheduled Meds:  Chlorhexidine Gluconate Cloth  6 each Topical Daily   furosemide  20 mg Intravenous Daily   hydrocortisone sod succinate (SOLU-CORTEF) inj  50 mg Intravenous Q8H   levothyroxine  100 mcg Oral QAC breakfast   midodrine  10 mg Oral TID WC   pantoprazole (PROTONIX) IV  40 mg Intravenous Q12H   pregabalin  200 mg Oral BID   rosuvastatin  5 mg Oral QHS   senna-docusate  2 tablet Oral BID   valACYclovir  1,000 mg Oral Daily   Continuous Infusions:  norepinephrine (LEVOPHED) Adult infusion Stopped (06/27/23 0914)   PRN Meds:.acetaminophen **OR** acetaminophen, polyethylene glycol, sodium chloride   Anti-infectives (From admission, onward)    Start     Dose/Rate Route Frequency Ordered Stop   06/25/23 1745  valACYclovir (VALTREX) tablet 1,000 mg        1,000 mg Oral Daily 06/25/23 1740     06/25/23 1300  cefTRIAXone (ROCEPHIN) 1 g in sodium chloride 0.9 % 100 mL IVPB        1 g 200 mL/hr over 30 Minutes Intravenous  Once 06/25/23 1256 06/25/23 1452   06/25/23 1300  azithromycin (ZITHROMAX) 500 mg in sodium chloride 0.9 % 250 mL IVPB        500 mg 250 mL/hr over 60 Minutes Intravenous  Once 06/25/23 1256 06/25/23 1452       Subjective: [name] today has no fevers, no emesis,   -- No new concerns -Tolerated EGD well  Objective: Vitals:   06/28/23 1744 06/28/23 1804 06/28/23 1805 06/28/23 1806  BP:   119/62   Pulse:   61   Resp: 15 18 18 19   Temp:      TempSrc:      SpO2: 96% 97% 96% 97%  Weight:      Height:        Intake/Output Summary (Last 24 hours) at 06/28/2023 1831 Last data filed at 06/28/2023  1744 Gross per 24 hour  Intake 300 ml  Output 2700 ml  Net -2400 ml   Filed Weights   06/28/23 0337 06/28/23 0800 06/28/23 1459  Weight: 128.2 kg 128.2 kg 128.2 kg    Physical Exam  Gen:- Awake Alert, no acute distress, speaking in sentences HEENT:- [place].AT, No sclera icterus, subtle left eye ptosis, not new Nose---3L/min Neck-Supple Neck,No JVD,.  Lungs-improving air movement, no wheezing  CV- S1, S2 normal, regular  Abd-  +ve B.Sounds, Abd Soft, No tenderness, increased truncal adiposity    Extremity/Skin:- No  edema, pedal pulses present  Psych-affect is appropriate, oriented x3 Neuro-generalized weakness no new focal deficits, no tremors  Data Reviewed: I have personally reviewed following labs and imaging studies  CBC: Recent Labs  Lab 06/25/23 1014 06/25/23 2223 06/26/23 0507 06/26/23 1517 06/27/23 0408 06/28/23 0522  WBC 8.6  --  9.9  --  11.3*  --   NEUTROABS 6.5  --   --   --   --   --  HGB 14.2 14.7 14.7 13.8 13.8 13.5  HCT 45.3 45.8 47.2 44.3 44.0 42.4  MCV 99.6  --  99.4  --  99.1  --   PLT 222  --  215  --  227  --    Basic Metabolic Panel: Recent Labs  Lab 06/25/23 1005 06/26/23 0507 06/27/23 0408 06/28/23 0522  NA 136 141 138 140  K 3.0* 3.3* 5.0 4.2  CL 95* 95* 95* 94*  CO2 33* 36* 36* 37*  GLUCOSE 96 66* 157* 106*  BUN 14 17 19 16   CREATININE 0.55* 0.57* 0.62 0.68  CALCIUM 8.6* 8.7* 8.7* 9.6  MG 1.9  --   --   --   PHOS  --   --   --  2.8   GFR: Estimated Creatinine Clearance: 113.8 mL/min (by C-G formula based on SCr of 0.68 mg/dL). Liver Function Tests: Recent Labs  Lab 06/25/23 1005 06/28/23 0522  AST 22  --   ALT 20  --   ALKPHOS 149*  --   BILITOT 1.5*  --   PROT 5.7*  --   ALBUMIN 3.3* 3.3*    Recent Results (from the past 240 hours)  Resp panel by RT-PCR (RSV, Flu A&B, Covid) Anterior Nasal Swab     Status: None   Collection Time: 06/25/23  9:41 AM   Specimen: Anterior Nasal Swab  Result Value Ref Range Status    SARS Coronavirus 2 by RT PCR NEGATIVE NEGATIVE Final    Comment: (NOTE) SARS-CoV-2 target nucleic acids are NOT DETECTED.  The SARS-CoV-2 RNA is generally detectable in upper respiratory specimens during the acute phase of infection. The lowest concentration of SARS-CoV-2 viral copies this assay can detect is 138 copies/mL. A negative result does not preclude SARS-Cov-2 infection and should not be used as the sole basis for treatment or other patient management decisions. A negative result may occur with  improper specimen collection/handling, submission of specimen other than nasopharyngeal swab, presence of viral mutation(s) within the areas targeted by this assay, and inadequate number of viral copies(<138 copies/mL). A negative result must be combined with clinical observations, patient history, and epidemiological information. The expected result is Negative.  Fact Sheet for Patients:  BloggerCourse.com  Fact Sheet for Healthcare Providers:  SeriousBroker.it  This test is no t yet approved or cleared by the [place] FDA and  has been authorized for detection and/or diagnosis of SARS-CoV-2 by FDA under an Emergency Use Authorization (EUA). This EUA will remain  in effect (meaning this test can be used) for the duration of the COVID-19 declaration under Section 564(b)(1) of the Act, 21 U.S.C.section 360bbb-3(b)(1), unless the authorization is terminated  or revoked sooner.       Influenza A by PCR NEGATIVE NEGATIVE Final   Influenza B by PCR NEGATIVE NEGATIVE Final    Comment: (NOTE) The Xpert Xpress SARS-CoV-2/FLU/RSV plus assay is intended as an aid in the diagnosis of influenza from Nasopharyngeal swab specimens and should not be used as a sole basis for treatment. Nasal washings and aspirates are unacceptable for Xpert Xpress SARS-CoV-2/FLU/RSV testing.  Fact Sheet for  Patients: BloggerCourse.com  Fact Sheet for Healthcare Providers: SeriousBroker.it  This test is not yet approved or cleared by the [place] FDA and has been authorized for detection and/or diagnosis of SARS-CoV-2 by FDA under an Emergency Use Authorization (EUA). This EUA will remain in effect (meaning this test can be used) for the duration of the COVID-19 declaration under Section 564(b)(1) of  the Act, 21 U.S.C. section 360bbb-3(b)(1), unless the authorization is terminated or revoked.     Resp Syncytial Virus by PCR NEGATIVE NEGATIVE Final    Comment: (NOTE) Fact Sheet for Patients: BloggerCourse.com  Fact Sheet for Healthcare Providers: SeriousBroker.it  This test is not yet approved or cleared by the [place] FDA and has been authorized for detection and/or diagnosis of SARS-CoV-2 by FDA under an Emergency Use Authorization (EUA). This EUA will remain in effect (meaning this test can be used) for the duration of the COVID-19 declaration under Section 564(b)(1) of the Act, 21 U.S.C. section 360bbb-3(b)(1), unless the authorization is terminated or revoked.  Performed at [facility], [address], [place], [place] [address]   MRSA Next Gen by PCR, Nasal     Status: None   Collection Time: 06/26/23  6:04 PM   Specimen: Nasal Mucosa; Nasal Swab  Result Value Ref Range Status   MRSA by PCR Next Gen NOT DETECTED NOT DETECTED Final    Comment: (NOTE) The GeneXpert MRSA Assay (FDA approved for NASAL specimens only), is one component of a comprehensive MRSA colonization surveillance program. It is not intended to diagnose MRSA infection nor to guide or monitor treatment for MRSA infections. Test performance is not FDA approved in patients less than 60 years old. Performed at [facility], [address], [place], [place] [address]     Radiology Studies: DG CHEST  PORT 1 VIEW Result Date: 06/28/2023 CLINICAL DATA:  Pleural effusion.  Follow-up exam. EXAM: PORTABLE CHEST 1 VIEW COMPARISON:  06/25/2023 and older studies.  CT, 06/25/2023. FINDINGS: Cardiac silhouette top-normal in size. No mediastinal or hilar masses. Mild, left greater than right lung base opacities are similar to the most recent prior exam consistent with a combination of atelectasis and small effusions. Remainder of the lungs is clear. No pneumothorax. Stable right anterior chest wall Port-A-Cath and left anterior chest wall dual lead pacemaker. IMPRESSION: 1. No significant change from the most recent prior study. 2. Persistent lung base opacities consistent with a combination of atelectasis and small effusions. Electronically Signed   By: Amie Portland M.D.   On: 06/28/2023 10:02   Scheduled Meds:  Chlorhexidine Gluconate Cloth  6 each Topical Daily   furosemide  20 mg Intravenous Daily   hydrocortisone sod succinate (SOLU-CORTEF) inj  50 mg Intravenous Q8H   levothyroxine  100 mcg Oral QAC breakfast   midodrine  10 mg Oral TID WC   pantoprazole (PROTONIX) IV  40 mg Intravenous Q12H   pregabalin  200 mg Oral BID   rosuvastatin  5 mg Oral QHS   senna-docusate  2 tablet Oral BID   valACYclovir  1,000 mg Oral Daily   Continuous Infusions:  norepinephrine (LEVOPHED) Adult infusion Stopped (06/27/23 0914)    LOS: 3 days   Shon Hale M.D on 06/28/2023 at 6:31 PM  Go to [email or web address] - for contact info  [facility] - Office  [phone]  If 7PM-7AM, please contact night-coverage [email or web address] 06/28/2023, 6:31 PM

## 2023-06-28 NOTE — Progress Notes (Addendum)
Patient briefly seen to assess candidacy for EGD today.  He remains of Levophed for 24 hours now. BP this morning 123/65, heart rate in the 70s. O2sat at 96% on 2L [place]. Lungs CTA. RRR. Abd soft, nontender. LE without edema. Hgb 13.5. K 4.2, glucose 106.   Patient did mention recurrent esophageal dysphagia and would like his esophagus stretched again. He has had response to esophageal dilation in the past.   Appropriate for EGD/ED today.  I have discussed the risks, alternatives, benefits with regards to but not limited to the risk of reaction to medication, bleeding, infection, perforation and the patient is agreeable to proceed. Written consent to be obtained.  Leanna Battles. Dixon [facility]os [facility] [phone] 2/21/20258:53 AM

## 2023-06-28 NOTE — Anesthesia Procedure Notes (Signed)
Date/Time: 06/28/2023 4:30 PM  Performed by: Julian Reil, CRNAPre-anesthesia Checklist: Patient identified, Emergency Drugs available, Suction available and Patient being monitored Patient Re-evaluated:Patient Re-evaluated prior to induction Oxygen Delivery Method: Nasal cannula Induction Type: IV induction Placement Confirmation: positive ETCO2 Comments: Optiflow High Flow  O2 used.

## 2023-06-29 DIAGNOSIS — J9601 Acute respiratory failure with hypoxia: Secondary | ICD-10-CM | POA: Diagnosis not present

## 2023-06-29 LAB — GLUCOSE, CAPILLARY
Glucose-Capillary: 107 mg/dL — ABNORMAL HIGH (ref 70–99)
Glucose-Capillary: 89 mg/dL (ref 70–99)
Glucose-Capillary: 116 mg/dL — ABNORMAL HIGH (ref 70–99)

## 2023-06-29 MED ORDER — MIDODRINE HCL 10 MG PO TABS: 10.0000 mg | ORAL_TABLET | Freq: Two times a day (BID) | ORAL | 3 refills | Status: DC

## 2023-06-29 MED ORDER — FLUDROCORTISONE ACETATE 0.1 MG PO TABS: 0.1000 mg | ORAL_TABLET | Freq: Every day | ORAL | 4 refills | Status: DC

## 2023-06-29 MED ORDER — SALINE SPRAY 0.65 % NA SOLN: 2.0000 | NASAL | 0 refills | Status: DC | PRN

## 2023-06-29 MED ORDER — FUROSEMIDE 20 MG PO TABS: 20.0000 mg | ORAL_TABLET | Freq: Every day | ORAL | 1 refills | Status: DC

## 2023-06-29 MED ORDER — POLYETHYLENE GLYCOL 3350 17 G PO PACK: 17.0000 g | PACK | Freq: Every day | ORAL | 4 refills | Status: AC

## 2023-06-29 MED ORDER — ACETAMINOPHEN 325 MG PO TABS: 650.0000 mg | ORAL_TABLET | Freq: Four times a day (QID) | ORAL | Status: AC | PRN

## 2023-06-29 MED ORDER — SENNOSIDES-DOCUSATE SODIUM 8.6-50 MG PO TABS
2.0000 | ORAL_TABLET | Freq: Every day | ORAL | 3 refills | Status: DC
Start: 2023-06-29 — End: 2023-08-08

## 2023-06-29 MED ORDER — PANTOPRAZOLE SODIUM 40 MG PO TBEC: 40.0000 mg | DELAYED_RELEASE_TABLET | Freq: Every day | ORAL | 5 refills | Status: AC

## 2023-06-29 MED ORDER — PANTOPRAZOLE SODIUM 40 MG PO TBEC: 40.0000 mg | DELAYED_RELEASE_TABLET | Freq: Every day | ORAL | 5 refills | Status: DC

## 2023-06-29 NOTE — Anesthesia Postprocedure Evaluation (Signed)
 Anesthesia Post Note  Patient: Roy [name]  Procedure(s) Performed: ESOPHAGOGASTRODUODENOSCOPY (EGD) WITH PROPOFOL MALONEY DILATION BIOPSY  Patient location during evaluation: Phase II Anesthesia Type: General Level of consciousness: awake Pain management: pain level controlled Vital Signs Assessment: post-procedure vital signs reviewed and stable Respiratory status: spontaneous breathing and respiratory function stable Cardiovascular status: blood pressure returned to baseline and stable Postop Assessment: no headache and no apparent nausea or vomiting Anesthetic complications: no Comments: Late entry   No notable events documented.   Last Vitals:  Vitals:   06/29/23 1016 06/29/23 1124  BP:    Pulse:    Resp:    Temp:    SpO2: 91% 95%    Last Pain:  Vitals:   06/29/23 0947  TempSrc:   PainSc: 0-No pain                 Windell Norfolk

## 2023-06-29 NOTE — Discharge Summary (Addendum)
 Roy [name], is a 73 y.o. male  DOB 01/21/1951  MRN [id number].  Admission date:  06/25/2023  Admitting Physician  Onnie Boer, MD  Discharge Date:  06/29/2023   Primary MD  Rebecka Apley, NP  Recommendations for primary care physician for things to follow:  1)Very Low-salt diet advised---Less than 2 gm of Sodium per day advised----ok to use Mrs DASH salt substitute instead of Salt 2)Weigh yourself daily, call if you gain more than 3 pounds in 1 day or more than 5 pounds in 1 week as your diuretic medications may need to be adjusted 3)Limit your Fluid  intake to no more than 60 ounces (1.8 Liters) per day 4)You are taking Eliquis/Apixaban which is a blood thinner----so Please Avoid ibuprofen/Advil/Aleve/Motrin/Goody Powders/Naproxen/BC powders/Meloxicam/Diclofenac/Indomethacin and other Nonsteroidal anti-inflammatory medications as these will make you more likely to bleed and can cause stomach ulcers, can also cause Kidney problems. 5)Please Stop Mobic/meloxicam as advised above #4 6)Repeat CBC and BMP blood test in 1 week 7)Follow-up with ENT/ear nose and throat doctor if you continue to have nosebleeds  Admission Diagnosis  Pleural effusion [J90] Acute respiratory failure with hypoxia (HCC) [J96.01] Acute on chronic congestive heart failure, unspecified heart failure type (HCC) [I50.9] Acute hypoxic respiratory failure (HCC) [J96.01]   Discharge Diagnosis  Pleural effusion [J90] Acute respiratory failure with hypoxia (HCC) [J96.01] Acute on chronic congestive heart failure, unspecified heart failure type (HCC) [I50.9] Acute hypoxic respiratory failure (HCC) [J96.01]    Principal Problem:   Acute hypoxic respiratory failure (HCC) Active Problems:   Atrial fibrillation (HCC)   Acute on chronic diastolic CHF (congestive heart failure) (HCC)   GI bleed   Hypokalemia   Essential  hypertension   Adrenal insufficiency (HCC)      Past Medical History:  Diagnosis Date   Atrial fibrillation (HCC)    Cancer (HCC)    Diffuse Large B-Cell lymphoma   GERD (gastroesophageal reflux disease)    High cholesterol    Hypertension    Obesity    Pacemaker     Past Surgical History:  Procedure Laterality Date   APPENDECTOMY     CATARACT EXTRACTION Bilateral 2022   CHOLECYSTECTOMY     ESOPHAGEAL DILATION     multiple times   IR IMAGING GUIDED PORT INSERTION  03/31/2021   JOINT REPLACEMENT Left    hip   LUMBAR PERCUTANEOUS PEDICLE SCREW 3 LEVEL  05/27/2023   Procedure: POSTERIOR FIXATION AND FUSION OF THORACIC ELEVEN-THORACIC TWELVE FRACTURE DISLOCATION. FIXATION FROM THORACIC TEN-THORACIC ELEVEN METHACRYLATE AUGMENTATION WITH FLUOROSCOPIC GUIDANCE;  Surgeon: Barnett Abu, MD;  Location: [facility] OR;  Service: Neurosurgery;;   PACEMAKER IMPLANT N/A 09/07/2022   Procedure: PACEMAKER IMPLANT;  Surgeon: Regan Lemming, MD;  Location: [facility] INVASIVE CV [facility];  Service: Cardiovascular;  Laterality: N/A;    HPI  from the history and physical done on the day of admission:   Chief Complaint: Nose Bleed, shortness of breath   HPI: Roy [name] is a 73 y.o. male with medical history significant for atrial  fibrillation on chronic anticoagulation with Eliquis, adrenal insufficiency, CHF, EMS was called for for nosebleeds that started spontaneously.  Bleeding continued for about 30 minutes until EMS got there applied pressure and Afrin.  O2 sats were checked and he was down to 79 to 80% on room air.  He denies difficulty breathing, orthopnea.  He denies cough.  No chest pain.  He denies difficulty breathing.  Denies lower extremity swelling or abdominal bloating.   Recent hospitalization 1/17 to 1/30-patient had unstable T11-12 chance fracture, 2 mm anterolisthesis, requiring transfer [facility] to [facility] and subsequent posterior spinal fixation by neurosurgeon Dr. Danielle Dess 1/20.   Stay was complicated by constipation/ileus.  Also acute hypoxic respiratory failure secondary to pain and immobility.  He was to follow-up for outpatient sleep study, and possibly PFTs to evaluate for restrictive lung disease, also rheumatology to evaluate ankylosing spondylitis seen on imaging. Patient was discharged to rehab, he was discharged home yesterday.   ED Course: Tmax 98.  Heart rate 62-86.  Respiratory rate 17-26.  Systolic blood pressure 103-141.  O2 sats down to 80% on room air, placed on nonrebreather initially and then weaned down to 3 L sats currently greater than 96%.  Potassium 3.  BNP elevated at 461.  VBG with pH of 7.3, pCO2 of 75. Portable chest x-ray suggested bibasilar pneumonia, subsequent CTA chest-Motion artifacts, negative for PE, shows moderate bilateral pleural effusions are increased from prior imaging. Started on antibiotics for initial concerns for pneumonia on chest x-ray. IV 40 mg Lasix given.   Review of Systems: As per HPI all other systems reviewed and negative.   Hospital Course:   Brief Narrative:     73 y.o. male with medical history significant for atrial fibrillation on chronic anticoagulation with Eliquis, adrenal insufficiency, CHF, admitted on 06/25/2023 with acute hypoxic respiratory failure in the setting of nosebleeds and concerns about possible GI bleed, on 06/26/2023 patient was noted to be persistently hypotensive, subsequently transferred to stepdown unit for possible pressor support after failing IV fluid boluses.      -Assessment and Plan: 1) Acute hypoxic respiratory failure -suspect CHF related please see #5 below -CTA chest without acute PE (was on Eliquis PTA) -COVID, influenza and RSV negative ---Repeat chest x-ray on 06/28/2023 with atelectasis and small pleural effusions -Responded well to gentle diuresis with IV Lasix --Hypoxia resolved, weaned off oxygen -Okay to discharge on low-dose Lasix   2)Possible GI bleed Large volume  melanotic stool , Hemoccult positive--may have been from epistaxis On anticoagulation with Eliquis. -Suspect Hemoccult positive melanotic stools due to nosebleeds with passage of bleeding from epistasis into the GI tract compounded by ongoing iron therapy --GI consult appreciated  - EGD on 06/28/2023 without significant upper GI findings to explain melena  Hgb overall stable after initially dropping from hemodilution most likely --Outpatient follow-up with ENT if recurrent epistasis advised -Protonix for GI prophylaxis advised     3)Persistent hypotension--- received IV fluid boluses...  -Some drop in hemoglobin most likely due to hemodilution rather than brisk bleeding -Suspect some component of adrenal insufficiency -Transferred to stepdown/ICU on 06/26/23 for pressor support with IV Levophed--- maintain MAP above 65 mmHg --weaned off IV Levophed on 06/27/2023 -Completed IV hydrocortisone through 06/28/2023, - restart PTA Florinef and increase midodrine to 10 mg twice daily at discharge   4)Episode of hypoglycemia--- encourage adequate oral intake -No further hypoglycemia with resumption of oral intake   5)Acute on chronic diastolic CHF (congestive heart failure) (HCC) CTA chest shows  increasing moderate bilateral pleural effusions, BNP elevated at 461, baseline 100s to 200.  No peripheral signs of edema my evaluation, per charts no significant weight gain.  Last echo 09/2022 EF of 60 to 65%, with LVH. -CTA chest without pneumonia, and clinical picture not consistent with pneumonia no further antibiotics needed -CTA chest with multichamber cardiomegaly and worsening bilateral pleural effusions -BNP is 461 which is higher than prior -Troponin 32 > 36 --- -BP has improved--- weaned off IV Levophed -Repeat chest x-ray on 06/28/2023 with atelectasis and small pleural effusions -Responded well to gentle diuresis with IV Lasix -Okay to discharge on low-dose Lasix   6)Atrial fibrillation  (HCC) Rate controlled and on anticoagulation with Eliquis.   -Eliquis on hold since 06/24/2023, no further bleeding concerns okay to restart Eliquis -- On admission patient had spontaneous nosebleeds-controlled with pressure and Afrin.   7)Hypokalemia--normalized with replacement Mag 1.9.   8)Adrenal insufficiency (HCC) Blood pressure stable after stress dose steroids- -Midodrine increased to 10 mg bid from 5 mg PTA -Random p.m. cortisol level was only 5.5-- -okay to restart PTA Florinef Please see stress dose steroids as in  #3 above   9)Essential hypertension ----please see #3 above - Disposition: The patient is from: Home              Anticipated d/c is to: Home  Discharge Condition: stable   Follow UP   Follow-up Information     Hemberg, Ruby Cola, NP. Schedule an appointment as soon as possible for a visit in 1 week(s).   Specialty: Adult Health Nurse Practitioner Why: Repeat CBC and BMP in 1 week Contact information: [address]  [place] [address] [phone]                  Consults obtained - Gi  Diet and Activity recommendation:  As advised  Discharge Instructions    Discharge Instructions     Call MD for:  difficulty breathing, headache or visual disturbances   Complete by: As directed    Call MD for:  persistant dizziness or light-headedness   Complete by: As directed    Call MD for:  persistant nausea and vomiting   Complete by: As directed    Call MD for:  temperature >100.4   Complete by: As directed    Diet - low sodium heart healthy   Complete by: As directed    Discharge instructions   Complete by: As directed    1)Very Low-salt diet advised---Less than 2 gm of Sodium per day advised----ok to use Mrs DASH salt substitute instead of Salt 2)Weigh yourself daily, call if you gain more than 3 pounds in 1 day or more than 5 pounds in 1 week as your diuretic medications may need to be adjusted 3)Limit your Fluid   intake to no more than 60 ounces (1.8 Liters) per day 4)You are taking Eliquis/Apixaban which is a blood thinner----so Please Avoid ibuprofen/Advil/Aleve/Motrin/Goody Powders/Naproxen/BC powders/Meloxicam/Diclofenac/Indomethacin and other Nonsteroidal anti-inflammatory medications as these will make you more likely to bleed and can cause stomach ulcers, can also cause Kidney problems. 5)Please Stop Mobic/meloxicam as advised above #4 6)Repeat CBC and BMP blood test in 1 week 7)Follow-up with ENT/ear nose and throat doctor if you continue to have nosebleeds   Face-to-face encounter (required for Medicare/Medicaid patients)   Complete by: As directed    I Rylend Pietrzak certify that this patient is under my care and that I, or a nurse practitioner or physician's assistant working  with me, had a face-to-face encounter that meets the physician face-to-face encounter requirements with this patient on 06/29/2023. The encounter with the patient was in whole, or in part for the following medical condition(s) which is the primary reason for home health care (List medical condition):   Congestive heart failure with generalized weakness and deconditioning   The encounter with the patient was in whole, or in part, for the following medical condition, which is the primary reason for home health care: Congestive heart failure with generalized weakness and deconditioning   I certify that, based on my findings, the following services are medically necessary home health services:  Physical therapy Nursing     Reason for Medically Necessary Home Health Services: Skilled Nursing- Change/Decline in Patient Status   My clinical findings support the need for the above services: Shortness of breath with activity   Further, I certify that my clinical findings support that this patient is homebound due to: Shortness of Breath with activity   Home Health   Complete by: As directed    To provide the following  care/treatments: PT   Increase activity slowly   Complete by: As directed         Discharge Medications     Allergies as of 06/29/2023       Reactions   Adhesive [tape] Other (See Comments)   Contact dermatitis   Sulfa Antibiotics Other (See Comments)   Unknown childhood reaction        Medication List     STOP taking these medications    meloxicam 7.5 MG tablet Commonly known as: MOBIC   omeprazole 10 MG capsule Commonly known as: PRILOSEC       TAKE these medications    acetaminophen 325 MG tablet Commonly known as: TYLENOL Take 2 tablets (650 mg total) by mouth every 6 (six) hours as needed for mild pain (pain score 1-3), fever or headache (or Fever >/= 101). What changed: reasons to take this   alfuzosin 10 MG 24 hr tablet Commonly known as: UROXATRAL TAKE ONE TABLET DAILY WITH BREAKFAST   apixaban 5 MG Tabs tablet Commonly known as: Eliquis Take 1 tablet (5 mg total) by mouth 2 (two) times daily.   calcium carbonate 1250 (500 Ca) MG tablet Commonly known as: OS-CAL - dosed in mg of elemental calcium Take 1 tablet (1,250 mg total) by mouth 2 (two) times daily with a meal.   cyanocobalamin 1000 MCG tablet Commonly known as: VITAMIN B12 Take 1 tablet (1,000 mcg total) by mouth daily.   ferrous sulfate 325 (65 FE) MG tablet Take 1 tablet (325 mg total) by mouth daily with breakfast.   fludrocortisone 0.1 MG tablet Commonly known as: FLORINEF Take 1 tablet (0.1 mg total) by mouth daily.   furosemide 20 MG tablet Commonly known as: Lasix Take 1 tablet (20 mg total) by mouth daily.   levothyroxine 100 MCG tablet Commonly known as: SYNTHROID Take 1 tablet (100 mcg total) by mouth daily before breakfast.   magnesium oxide 400 (240 Mg) MG tablet Commonly known as: MAG-OX Take 1 tablet (400 mg total) by mouth daily.   methocarbamol 500 MG tablet Commonly known as: ROBAXIN Take 1 tablet (500 mg total) by mouth every 6 (six) hours as needed for  muscle spasms.   midodrine 10 MG tablet Commonly known as: PROAMATINE Take 1 tablet (10 mg total) by mouth 2 (two) times daily with a meal. What changed:  medication strength how much to take  pantoprazole 40 MG tablet Commonly known as: Protonix Take 1 tablet (40 mg total) by mouth daily.   polyethylene glycol 17 g packet Commonly known as: MiraLax Take 17 g by mouth daily. What changed: when to take this   predniSONE 10 MG tablet Commonly known as: DELTASONE Take 1 tablet (10 mg total) by mouth daily with breakfast.   pregabalin 200 MG capsule Commonly known as: LYRICA Take 1 capsule (200 mg total) by mouth 2 (two) times daily.   rosuvastatin 5 MG tablet Commonly known as: CRESTOR Take 1 tablet (5 mg total) by mouth at bedtime.   senna-docusate 8.6-50 MG tablet Commonly known as: Senokot-S Take 2 tablets by mouth at bedtime. What changed: when to take this   sodium chloride 0.65 % Soln nasal spray Commonly known as: OCEAN Place 2 sprays into both nostrils as needed for congestion.   valACYclovir 1000 MG tablet Commonly known as: VALTREX Take 1 tablet (1,000 mg total) by mouth daily.   Vitamin D (Ergocalciferol) 1.25 MG (50000 UNIT) Caps capsule Commonly known as: DRISDOL Take 1 capsule (50,000 Units total) by mouth every 7 (seven) days.        Major procedures and Radiology Reports - PLEASE review detailed and final reports for all details, in brief -   DG CHEST PORT 1 VIEW Result Date: 06/28/2023 CLINICAL DATA:  Pleural effusion.  Follow-up exam. EXAM: PORTABLE CHEST 1 VIEW COMPARISON:  06/25/2023 and older studies.  CT, 06/25/2023. FINDINGS: Cardiac silhouette top-normal in size. No mediastinal or hilar masses. Mild, left greater than right lung base opacities are similar to the most recent prior exam consistent with a combination of atelectasis and small effusions. Remainder of the lungs is clear. No pneumothorax. Stable right anterior chest wall  Port-A-Cath and left anterior chest wall dual lead pacemaker. IMPRESSION: 1. No significant change from the most recent prior study. 2. Persistent lung base opacities consistent with a combination of atelectasis and small effusions. Electronically Signed   By: Amie Portland M.D.   On: 06/28/2023 10:02   CUP PACEART REMOTE DEVICE CHECK Result Date: 06/25/2023 Scheduled remote reviewed. Normal device function.  Known permanent AF, on OAC, good ventricular rate control Next remote 91 days. ML, CVRS  CT Angio Chest PE W and/or Wo Contrast Result Date: 06/25/2023 CLINICAL DATA:  Hypoxia.  Recently status post spine surgery EXAM: CT ANGIOGRAPHY CHEST WITH CONTRAST TECHNIQUE: Multidetector CT imaging of the chest was performed using the standard protocol during bolus administration of intravenous contrast. Multiplanar CT image reconstructions and MIPs were obtained to evaluate the vascular anatomy. RADIATION DOSE REDUCTION: This exam was performed according to the departmental dose-optimization program which includes automated exposure control, adjustment of the mA and/or kV according to patient size and/or use of iterative reconstruction technique. CONTRAST:  75mL OMNIPAQUE IOHEXOL 350 MG/ML SOLN COMPARISON:  Same day chest radiograph, thoracic spine MRI dated 05/28/2023 FINDINGS: Decreased sensitivity and specificity for detailed findings due to motion artifact. Cardiovascular: Right chest wall port tip terminates at the superior cavoatrial junction. Left chest wall pacemaker leads terminate in the right atrium and ventricle. The study is adequate for the evaluation of pulmonary embolism to the level of the proximal segmental arteries. There are no filling defects in the central, lobar, proximal segmental segmental pulmonary artery branches to suggest acute pulmonary embolism. Great vessels are normal in course and caliber. Multichamber cardiomegaly. No significant pericardial fluid/thickening. Coronary artery  calcifications and aortic atherosclerosis. Mediastinum/Nodes: Imaged thyroid gland without nodules meeting criteria for imaging  follow-up by size. Normal esophagus. No pathologically enlarged axillary, supraclavicular, mediastinal, or hilar lymph nodes. Lungs/Pleura: The central airways are patent. Subsegmental mucous plugging in the lingula and left lower lobe. Bilateral lower lobe relaxation atelectasis. No pneumothorax. Moderate bilateral pleural effusions, increased compared to 05/28/2023. Upper abdomen: Normal. Musculoskeletal: No acute or abnormal lytic or blastic osseous lesions. Postsurgical changes of T10-12. Small foci of gas within the T11-12 intervertebral disc space. Review of the MIP images confirms the above findings. IMPRESSION: 1. Decreased sensitivity and specificity for detailed findings due to motion artifact. No evidence of pulmonary embolism to the level of the proximal segmental arteries. 2. Moderate bilateral pleural effusions, increased compared to 05/28/2023. 3. Multichamber cardiomegaly. 4. Gas within the T11-12 intervertebral disc space, which may be postsurgical. 5. Aortic Atherosclerosis (ICD10-I70.0). Coronary artery calcifications. Assessment for potential risk factor modification, dietary therapy or pharmacologic therapy may be warranted, if clinically indicated. Electronically Signed   By: Agustin Cree M.D.   On: 06/25/2023 14:56   DG Chest Port 1 View Result Date: 06/25/2023 CLINICAL DATA:  Hypoxia EXAM: PORTABLE CHEST 1 VIEW COMPARISON:  Chest radiograph 06/04/2023 FINDINGS: Port-A-Cath tip projects over the superior vena cava. Dual lead pacer apparatus overlies the left hemithorax. Stable cardiomegaly. Similar bilateral lower lung heterogeneous opacities. No definite large pleural effusion or pneumothorax. Thoracic spine degenerative changes. Spinal fusion hardware. IMPRESSION: Similar bilateral lower lung heterogeneous opacities which may represent atelectasis or infection.  Electronically Signed   By: Annia Belt M.D.   On: 06/25/2023 12:17   DG Abd Portable 1V Result Date: 06/05/2023 CLINICAL DATA:  Ileus EXAM: PORTABLE ABDOMEN - 1 VIEW COMPARISON:  Yesterday FINDINGS: Diffuse gaseous distension of small and large bowel. No evidence of pneumatosis or progression. No concerning mass effect or calcification. There is thoracolumbar spine ankylosis and fusion hardware. IMPRESSION: Unchanged generalized ileus pattern. Electronically Signed   By: Tiburcio Pea M.D.   On: 06/05/2023 08:54   DG Abd 1 View Result Date: 06/04/2023 CLINICAL DATA:  NG tube advancement. EXAM: ABDOMEN - 1 VIEW COMPARISON:  Earlier today FINDINGS: The enteric tube has been advanced, the tip and side port are below the diaphragm in the stomach. No gaseous bowel distention in the included upper abdomen. Right upper quadrant surgical clips. IMPRESSION: Enteric tube tip and side port below the diaphragm in the stomach. Electronically Signed   By: Narda Rutherford M.D.   On: 06/04/2023 16:30   DG Abd 1 View Result Date: 06/04/2023 CLINICAL DATA:  Feeding tube placement. EXAM: ABDOMEN - 1 VIEW COMPARISON:  Abdominal radiograph dated 06/04/2023. FINDINGS: Partially visualized enteric tube with poorly visualized tip over the mediastinum, likely in mid to distal esophagus. Recommend further advancing and repeat radiograph. IMPRESSION: Enteric tube with tip over the mediastinum. Recommend further advancing and repeat radiograph. Electronically Signed   By: Elgie Collard M.D.   On: 06/04/2023 15:19   DG Abd Portable 1V Result Date: 06/04/2023 CLINICAL DATA:  Abdominal distension. EXAM: PORTABLE ABDOMEN - 1 VIEW COMPARISON:  Radiograph 06/02/2023 FINDINGS: Mild gaseous gastric distension. Slight increasing gaseous small and large bowel distension. Right upper quadrant surgical clips. Thoracolumbar and left hip hardware. IMPRESSION: Gaseous bowel distension slightly worsened over the last 2 days, suggestive of  ileus. Electronically Signed   By: Narda Rutherford M.D.   On: 06/04/2023 12:08   DG CHEST PORT 1 VIEW Result Date: 06/04/2023 CLINICAL DATA:  Dyspnea. EXAM: PORTABLE CHEST 1 VIEW COMPARISON:  05/24/2023 FINDINGS: Left-sided pacemaker and right chest port remain in  place. Anti lordotic positioning. Development of hazy bibasilar opacities which may represent atelectasis, effusion, or combination there of. Stable heart size and mediastinal contours. No pneumothorax. No pulmonary edema. IMPRESSION: Development of hazy bibasilar opacities which may represent atelectasis, effusion, or combination there of. Electronically Signed   By: Narda Rutherford M.D.   On: 06/04/2023 12:07   DG Abd Portable 1V Result Date: 06/02/2023 CLINICAL DATA:  Abdominal distention. EXAM: PORTABLE ABDOMEN - 1 VIEW COMPARISON:  None Available. FINDINGS: Mild diffuse gaseous distention of small bowel and colon is seen, consistent with ileus. IMPRESSION: Ileus pattern. Electronically Signed   By: Danae Orleans M.D.   On: 06/02/2023 14:06    Micro Results   Recent Results (from the past 240 hours)  Resp panel by RT-PCR (RSV, Flu A&B, Covid) Anterior Nasal Swab     Status: None   Collection Time: 06/25/23  9:41 AM   Specimen: Anterior Nasal Swab  Result Value Ref Range Status   SARS Coronavirus 2 by RT PCR NEGATIVE NEGATIVE Final    Comment: (NOTE) SARS-CoV-2 target nucleic acids are NOT DETECTED.  The SARS-CoV-2 RNA is generally detectable in upper respiratory specimens during the acute phase of infection. The lowest concentration of SARS-CoV-2 viral copies this assay can detect is 138 copies/mL. A negative result does not preclude SARS-Cov-2 infection and should not be used as the sole basis for treatment or other patient management decisions. A negative result may occur with  improper specimen collection/handling, submission of specimen other than nasopharyngeal swab, presence of viral mutation(s) within the areas  targeted by this assay, and inadequate number of viral copies(<138 copies/mL). A negative result must be combined with clinical observations, patient history, and epidemiological information. The expected result is Negative.  Fact Sheet for Patients:  BloggerCourse.com  Fact Sheet for Healthcare Providers:  SeriousBroker.it  This test is no t yet approved or cleared by the [place] FDA and  has been authorized for detection and/or diagnosis of SARS-CoV-2 by FDA under an Emergency Use Authorization (EUA). This EUA will remain  in effect (meaning this test can be used) for the duration of the COVID-19 declaration under Section 564(b)(1) of the Act, 21 U.S.C.section 360bbb-3(b)(1), unless the authorization is terminated  or revoked sooner.       Influenza A by PCR NEGATIVE NEGATIVE Final   Influenza B by PCR NEGATIVE NEGATIVE Final    Comment: (NOTE) The Xpert Xpress SARS-CoV-2/FLU/RSV plus assay is intended as an aid in the diagnosis of influenza from Nasopharyngeal swab specimens and should not be used as a sole basis for treatment. Nasal washings and aspirates are unacceptable for Xpert Xpress SARS-CoV-2/FLU/RSV testing.  Fact Sheet for Patients: BloggerCourse.com  Fact Sheet for Healthcare Providers: SeriousBroker.it  This test is not yet approved or cleared by the [place] FDA and has been authorized for detection and/or diagnosis of SARS-CoV-2 by FDA under an Emergency Use Authorization (EUA). This EUA will remain in effect (meaning this test can be used) for the duration of the COVID-19 declaration under Section 564(b)(1) of the Act, 21 U.S.C. section 360bbb-3(b)(1), unless the authorization is terminated or revoked.     Resp Syncytial Virus by PCR NEGATIVE NEGATIVE Final    Comment: (NOTE) Fact Sheet for  Patients: BloggerCourse.com  Fact Sheet for Healthcare Providers: SeriousBroker.it  This test is not yet approved or cleared by the [place] FDA and has been authorized for detection and/or diagnosis of SARS-CoV-2 by FDA under an Emergency Use Authorization (EUA). This EUA  will remain in effect (meaning this test can be used) for the duration of the COVID-19 declaration under Section 564(b)(1) of the Act, 21 U.S.C. section 360bbb-3(b)(1), unless the authorization is terminated or revoked.  Performed at [facility], [address], [place], [place] [address]   MRSA Next Gen by PCR, Nasal     Status: None   Collection Time: 06/26/23  6:04 PM   Specimen: Nasal Mucosa; Nasal Swab  Result Value Ref Range Status   MRSA by PCR Next Gen NOT DETECTED NOT DETECTED Final    Comment: (NOTE) The GeneXpert MRSA Assay (FDA approved for NASAL specimens only), is one component of a comprehensive MRSA colonization surveillance program. It is not intended to diagnose MRSA infection nor to guide or monitor treatment for MRSA infections. Test performance is not FDA approved in patients less than 39 years old. Performed at [facility], [address], [place], [place] [address]    Today   Subjective    Jeri Rawlins today has no new concerns  - Weaned off oxygen -Ambulatory physical therapy -No further bleeding concerns -Voiding well          Patient has been seen and examined prior to discharge   Objective   Blood pressure (!) 141/86, pulse 72, temperature 97.9 F (36.6 C), resp. rate 20, height 6' (1.829 m), weight 124.5 kg, SpO2 95%.   Intake/Output Summary (Last 24 hours) at 06/29/2023 1403 Last data filed at 06/29/2023 1234 Gross per 24 hour  Intake 540 ml  Output 2550 ml  Net -2010 ml   Exam Gen:- Awake Alert, no acute distress, no conversational dyspnea HEENT:- [place].AT, No sclera icterus Neck-Supple Neck,No JVD,.   Lungs-improved air movement, no wheezing CV- S1, S2 normal, regular, right-sided Port-A-Cath in situ, left-sided pacemaker in situ Abd-  +ve B.Sounds, Abd Soft, No tenderness,    Extremity/Skin:- No  edema,   good pulses Psych-affect is appropriate, oriented x3 Neuro-generalized weakness, no new focal deficits, no tremors    Data Review   CBC w Diff:  Lab Results  Component Value Date   WBC 11.3 (H) 06/27/2023   HGB 13.5 06/28/2023   HCT 42.4 06/28/2023   PLT 227 06/27/2023   LYMPHOPCT 16 06/25/2023   BANDSPCT 3 09/27/2021   MONOPCT 6 06/25/2023   EOSPCT 2 06/25/2023   BASOPCT 1 06/25/2023   CMP:  Lab Results  Component Value Date   NA 140 06/28/2023   NA 143 10/04/2022   K 4.2 06/28/2023   CL 94 (L) 06/28/2023   CO2 37 (H) 06/28/2023   BUN 16 06/28/2023   BUN 11 10/04/2022   CREATININE 0.68 06/28/2023   CREATININE 0.82 11/11/2020   PROT 5.7 (L) 06/25/2023   PROT 6.1 10/04/2022   ALBUMIN 3.3 (L) 06/28/2023   ALBUMIN 4.1 10/04/2022   BILITOT 1.5 (H) 06/25/2023   BILITOT 0.9 10/04/2022   BILITOT 2.3 (H) 11/11/2020   ALKPHOS 149 (H) 06/25/2023   AST 22 06/25/2023   AST 74 (H) 11/11/2020   ALT 20 06/25/2023   ALT 21 11/11/2020   Total Discharge time is about 33 minutes  Shon Hale M.D on 06/29/2023 at 2:03 PM  Go to [email or web address] -  for contact info  [facility] - Office  [phone]

## 2023-06-29 NOTE — Evaluation (Signed)
 Physical Therapy Evaluation Patient Details Name: Roy [name] MRN: [id number] DOB: 1950-10-15 Today's Date: 06/29/2023  History of Present Illness  Roy [name] is a 73 y.o. male with medical history significant for atrial fibrillation on chronic anticoagulation with Eliquis, adrenal insufficiency, CHF,  EMS was called for for nosebleeds that started spontaneously.  Bleeding continued for about 30 minutes until EMS got there applied pressure and Afrin.  O2 sats were checked and he was down to 79 to 80% on room air.  He denies difficulty breathing, orthopnea.  He denies cough.  No chest pain.  He denies difficulty breathing.  Denies lower extremity swelling or abdominal bloating.    Clinical Impression  Patient lying in bed but alert on therapist arrival.  Agreeable to therapist assessment.  Patient is currently on room air.  Supine to sit takes patient extra effort and he uses hand rails to pull himself up to sitting.  Patient is able to sit on the edge of the bed with feet and hands supported demonstrating good sitting balance.  Patient performs sit to stand to RW with bed slightly elevated with CGA and ambulates x 60 ft with RW and CGA; decreased gait speed noted and slow turns. Patient returns to bed and his O2 after walking is 88% but quicky returns to 95% after short seated rest.  patient left in bed with call button in reach, bed alarm set and nursing notified of mobility status. Patient will benefit from continued skilled therapy services during the remainder of his hospital stay and at the next recommended venue of care to address deficits and promote return to optimal function.           If plan is discharge home, recommend the following: A little help with walking and/or transfers;A little help with bathing/dressing/bathroom;Help with stairs or ramp for entrance;Assistance with cooking/housework   Can travel by private vehicle   Yes    Equipment Recommendations None recommended  by PT  Recommendations for Other Services       Functional Status Assessment Patient has had a recent decline in their functional status and demonstrates the ability to make significant improvements in function in a reasonable and predictable amount of time.     Precautions / Restrictions Precautions Precautions: Fall;Back Precaution Booklet Issued: No Recall of Precautions/Restrictions: Intact Precaution/Restrictions Comments: back surgery 05/27/23 per Dr. Danielle Dess Restrictions Weight Bearing Restrictions Per Provider Order: No      Mobility  Bed Mobility Overal bed mobility: Needs Assistance Bed Mobility: Supine to Sit     Supine to sit: Contact guard, Used rails, HOB elevated Sit to supine: Contact guard assist, Used rails     Patient Response: Cooperative  Transfers Overall transfer level: Needs assistance Equipment used: Rolling walker (2 wheels) Transfers: Sit to/from Stand Sit to Stand: Contact guard assist, From elevated surface                Ambulation/Gait Ambulation/Gait assistance: Contact guard assist Gait Distance (Feet): 60 Feet Assistive device: Rolling walker (2 wheels) Gait Pattern/deviations: Decreased stride length Gait velocity: decreased     General Gait Details: takes extra time to turn  Stairs            Wheelchair Mobility     Tilt Bed Tilt Bed Patient Response: Cooperative  Modified Rankin (Stroke Patients Only)       Balance Overall balance assessment: Needs assistance Sitting-balance support: Feet supported, Bilateral upper extremity supported Sitting balance-Leahy Scale: Good Sitting balance - Comments: good  sitting balance on EOB with feet supported   Standing balance support: Bilateral upper extremity supported, During functional activity, Reliant on assistive device for balance Standing balance-Leahy Scale: Fair Standing balance comment: fair to good standing balance with RW and CGA                              Pertinent Vitals/Pain Pain Assessment Pain Assessment: 0-10 Pain Score: 6  Pain Location: right side back Pain Descriptors / Indicators: Spasm Pain Intervention(s): Limited activity within patient's tolerance, Monitored during session, Repositioned    Home Living Family/patient expects to be discharged to:: Private residence Living Arrangements: Alone Available Help at Discharge: Family;Friend(s);Neighbor;Available PRN/intermittently Type of Home: House Home Access: Stairs to enter Entrance Stairs-Rails: Left;Right;Can reach both Entrance Stairs-Number of Steps: 3   Home Layout: One level Home Equipment: Agricultural consultant (2 wheels);Cane - single point;BSC/3in1 Additional Comments: son has hx of back problems but could help at more supervision level. pt reports neighbors could help with food    Prior Function Prior Level of Function : Independent/Modified Independent;Driving             Mobility Comments: walking with RW ADLs Comments: indep     Extremity/Trunk Assessment   Upper Extremity Assessment Upper Extremity Assessment: Right hand dominant    Lower Extremity Assessment Lower Extremity Assessment: Generalized weakness    Cervical / Trunk Assessment Cervical / Trunk Assessment: Back Surgery  Communication   Communication Communication: No apparent difficulties    Cognition Arousal: Alert Behavior During Therapy: WFL for tasks assessed/performed   PT - Cognitive impairments: No apparent impairments                                 Cueing       General Comments      Exercises     Assessment/Plan    PT Assessment Patient needs continued PT services  PT Problem List Decreased strength;Decreased mobility;Decreased activity tolerance;Decreased balance;Decreased knowledge of use of DME;Pain;Decreased safety awareness       PT Treatment Interventions DME instruction;Therapeutic activities;Gait training;Therapeutic  exercise;Patient/family education;Balance training;Stair training;Functional mobility training;Neuromuscular re-education    PT Goals (Current goals can be found in the Care Plan section)  Acute Rehab PT Goals Patient Stated Goal: return home PT Goal Formulation: With patient Time For Goal Achievement: 07/13/23 Potential to Achieve Goals: Good    Frequency Min 2X/week     Co-evaluation               AM-PAC PT "6 Clicks" Mobility  Outcome Measure Help needed turning from your back to your side while in a flat bed without using bedrails?: A Little Help needed moving from lying on your back to sitting on the side of a flat bed without using bedrails?: A Little Help needed moving to and from a bed to a chair (including a wheelchair)?: A Little Help needed standing up from a chair using your arms (e.g., wheelchair or bedside chair)?: A Little Help needed to walk in hospital room?: A Little Help needed climbing 3-5 steps with a railing? : A Lot 6 Click Score: 17    End of Session   Activity Tolerance: Patient tolerated treatment well Patient left: in bed;with call bell/phone within reach;with bed alarm set Nurse Communication: Mobility status PT Visit Diagnosis: Other abnormalities of gait and mobility (R26.89);Muscle weakness (generalized) (M62.81);Pain Pain - part  of body:  (back)    Time: 1030-1050 PT Time Calculation (min) (ACUTE ONLY): 20 min   Charges:   PT Evaluation $PT Eval Moderate Complexity: 1 Mod   PT General Charges $$ ACUTE PT VISIT: 1 Visit         11:34 AM, 06/29/23 Sharetha Newson Small Teller [place] MPT [place] [facility] [place] [phone] [phone]

## 2023-06-29 NOTE — Plan of Care (Signed)
  Problem: Acute Rehab PT Goals(only PT should resolve) Goal: Pt Will Go Supine/Side To Sit Outcome: Progressing Flowsheets (Taken 06/29/2023 1135) Pt will go Supine/Side to Sit: with supervision Goal: Patient Will Transfer Sit To/From Stand Outcome: Progressing Flowsheets (Taken 06/29/2023 1135) Patient will transfer sit to/from stand: with supervision Goal: Pt Will Transfer Bed To Chair/Chair To Bed Outcome: Progressing Flowsheets (Taken 06/29/2023 1135) Pt will Transfer Bed to Chair/Chair to Bed: with supervision Goal: Pt Will Ambulate Outcome: Progressing Flowsheets (Taken 06/29/2023 1135) Pt will Ambulate:  with least restrictive assistive device  100 feet  with supervision

## 2023-06-29 NOTE — Discharge Instructions (Signed)
 1)Very Low-salt diet advised---Less than 2 gm of Sodium per day advised----ok to use Mrs DASH salt substitute instead of Salt 2)Weigh yourself daily, call if you gain more than 3 pounds in 1 day or more than 5 pounds in 1 week as your diuretic medications may need to be adjusted 3)Limit your Fluid  intake to no more than 60 ounces (1.8 Liters) per day 4)You are taking Eliquis/Apixaban which is a blood thinner----so Please Avoid ibuprofen/Advil/Aleve/Motrin/Goody Powders/Naproxen/BC powders/Meloxicam/Diclofenac/Indomethacin and other Nonsteroidal anti-inflammatory medications as these will make you more likely to bleed and can cause stomach ulcers, can also cause Kidney problems. 5)Please Stop Mobic/meloxicam as advised above #4 6)Repeat CBC and BMP blood test in 1 week 7)Follow-up with ENT/ear nose and throat doctor if you continue to have nosebleeds

## 2023-06-29 NOTE — Plan of Care (Signed)

## 2023-07-01 ENCOUNTER — Encounter (HOSPITAL_COMMUNITY): Payer: Self-pay | Admitting: Internal Medicine

## 2023-07-02 ENCOUNTER — Encounter (HOSPITAL_COMMUNITY): Payer: Self-pay | Admitting: Emergency Medicine

## 2023-07-02 ENCOUNTER — Emergency Department (HOSPITAL_COMMUNITY): Payer: 59

## 2023-07-02 ENCOUNTER — Other Ambulatory Visit: Payer: Self-pay

## 2023-07-02 ENCOUNTER — Inpatient Hospital Stay (HOSPITAL_COMMUNITY)
Admission: EM | Admit: 2023-07-02 | Discharge: 2023-07-10 | DRG: 193 | Disposition: A | Discharge status: Skilled Nursing Facility | Payer: 59 | Attending: Family Medicine | Admitting: Family Medicine

## 2023-07-02 DIAGNOSIS — E876 Hypokalemia: Secondary | ICD-10-CM | POA: Diagnosis present

## 2023-07-02 DIAGNOSIS — Z9842 Cataract extraction status, left eye: Secondary | ICD-10-CM

## 2023-07-02 DIAGNOSIS — J189 Pneumonia, unspecified organism: Secondary | ICD-10-CM | POA: Diagnosis not present

## 2023-07-02 DIAGNOSIS — Z9841 Cataract extraction status, right eye: Secondary | ICD-10-CM

## 2023-07-02 DIAGNOSIS — J1001 Influenza due to other identified influenza virus with the same other identified influenza virus pneumonia: Principal | ICD-10-CM | POA: Diagnosis present

## 2023-07-02 DIAGNOSIS — K219 Gastro-esophageal reflux disease without esophagitis: Secondary | ICD-10-CM | POA: Diagnosis present

## 2023-07-02 DIAGNOSIS — I4892 Unspecified atrial flutter: Secondary | ICD-10-CM | POA: Diagnosis present

## 2023-07-02 DIAGNOSIS — I10 Essential (primary) hypertension: Secondary | ICD-10-CM | POA: Diagnosis present

## 2023-07-02 DIAGNOSIS — Z1152 Encounter for screening for COVID-19: Secondary | ICD-10-CM | POA: Diagnosis not present

## 2023-07-02 DIAGNOSIS — I4821 Permanent atrial fibrillation: Secondary | ICD-10-CM | POA: Diagnosis not present

## 2023-07-02 DIAGNOSIS — Z8249 Family history of ischemic heart disease and other diseases of the circulatory system: Secondary | ICD-10-CM | POA: Diagnosis not present

## 2023-07-02 DIAGNOSIS — I5033 Acute on chronic diastolic (congestive) heart failure: Secondary | ICD-10-CM | POA: Diagnosis present

## 2023-07-02 DIAGNOSIS — I4891 Unspecified atrial fibrillation: Secondary | ICD-10-CM | POA: Diagnosis present

## 2023-07-02 DIAGNOSIS — J9602 Acute respiratory failure with hypercapnia: Secondary | ICD-10-CM | POA: Diagnosis present

## 2023-07-02 DIAGNOSIS — J101 Influenza due to other identified influenza virus with other respiratory manifestations: Secondary | ICD-10-CM | POA: Diagnosis not present

## 2023-07-02 DIAGNOSIS — I110 Hypertensive heart disease with heart failure: Secondary | ICD-10-CM | POA: Diagnosis present

## 2023-07-02 DIAGNOSIS — E7800 Pure hypercholesterolemia, unspecified: Secondary | ICD-10-CM | POA: Diagnosis present

## 2023-07-02 DIAGNOSIS — Z91048 Other nonmedicinal substance allergy status: Secondary | ICD-10-CM

## 2023-07-02 DIAGNOSIS — Z6837 Body mass index (BMI) 37.0-37.9, adult: Secondary | ICD-10-CM

## 2023-07-02 DIAGNOSIS — Z79899 Other long term (current) drug therapy: Secondary | ICD-10-CM

## 2023-07-02 DIAGNOSIS — Z981 Arthrodesis status: Secondary | ICD-10-CM | POA: Diagnosis not present

## 2023-07-02 DIAGNOSIS — Z7952 Long term (current) use of systemic steroids: Secondary | ICD-10-CM

## 2023-07-02 DIAGNOSIS — Z96642 Presence of left artificial hip joint: Secondary | ICD-10-CM | POA: Diagnosis present

## 2023-07-02 DIAGNOSIS — I951 Orthostatic hypotension: Secondary | ICD-10-CM | POA: Diagnosis present

## 2023-07-02 DIAGNOSIS — I350 Nonrheumatic aortic (valve) stenosis: Secondary | ICD-10-CM | POA: Diagnosis present

## 2023-07-02 DIAGNOSIS — I480 Paroxysmal atrial fibrillation: Secondary | ICD-10-CM | POA: Diagnosis not present

## 2023-07-02 DIAGNOSIS — I5031 Acute diastolic (congestive) heart failure: Secondary | ICD-10-CM | POA: Diagnosis not present

## 2023-07-02 DIAGNOSIS — Z882 Allergy status to sulfonamides status: Secondary | ICD-10-CM

## 2023-07-02 DIAGNOSIS — Z8572 Personal history of non-Hodgkin lymphomas: Secondary | ICD-10-CM | POA: Diagnosis not present

## 2023-07-02 DIAGNOSIS — Z9049 Acquired absence of other specified parts of digestive tract: Secondary | ICD-10-CM

## 2023-07-02 DIAGNOSIS — E039 Hypothyroidism, unspecified: Secondary | ICD-10-CM | POA: Diagnosis present

## 2023-07-02 DIAGNOSIS — J9601 Acute respiratory failure with hypoxia: Principal | ICD-10-CM | POA: Diagnosis present

## 2023-07-02 DIAGNOSIS — J09X1 Influenza due to identified novel influenza A virus with pneumonia: Secondary | ICD-10-CM | POA: Insufficient documentation

## 2023-07-02 DIAGNOSIS — E669 Obesity, unspecified: Secondary | ICD-10-CM | POA: Diagnosis present

## 2023-07-02 DIAGNOSIS — Z7901 Long term (current) use of anticoagulants: Secondary | ICD-10-CM

## 2023-07-02 DIAGNOSIS — E2740 Unspecified adrenocortical insufficiency: Secondary | ICD-10-CM | POA: Diagnosis present

## 2023-07-02 DIAGNOSIS — J9622 Acute and chronic respiratory failure with hypercapnia: Secondary | ICD-10-CM | POA: Diagnosis not present

## 2023-07-02 DIAGNOSIS — J9621 Acute and chronic respiratory failure with hypoxia: Secondary | ICD-10-CM | POA: Diagnosis not present

## 2023-07-02 DIAGNOSIS — Z79890 Hormone replacement therapy (postmenopausal): Secondary | ICD-10-CM | POA: Diagnosis not present

## 2023-07-02 LAB — COMPREHENSIVE METABOLIC PANEL
ALT: 22 U/L (ref 0–44)
AST: 27 U/L (ref 15–41)
Albumin: 3.2 g/dL — ABNORMAL LOW (ref 3.5–5.0)
Alkaline Phosphatase: 136 U/L — ABNORMAL HIGH (ref 38–126)
Anion gap: 15 (ref 5–15)
BUN: 11 mg/dL (ref 8–23)
CO2: 36 mmol/L — ABNORMAL HIGH (ref 22–32)
Calcium: 8.6 mg/dL — ABNORMAL LOW (ref 8.9–10.3)
Chloride: 85 mmol/L — ABNORMAL LOW (ref 98–111)
Creatinine, Ser: 0.76 mg/dL (ref 0.61–1.24)
GFR, Estimated: >60 mL/min (ref >60)
Glucose, Bld: 162 mg/dL — ABNORMAL HIGH (ref 70–99)
Potassium: 2.7 mmol/L — CL (ref 3.5–5.1)
Sodium: 136 mmol/L (ref 135–145)
Total Bilirubin: 1.7 mg/dL — ABNORMAL HIGH (ref 0.0–1.2)
Total Protein: 5.8 g/dL — ABNORMAL LOW (ref 6.5–8.1)

## 2023-07-02 LAB — PROTIME-INR
INR: 1.6 — ABNORMAL HIGH (ref 0.8–1.2)
Prothrombin Time: 19.6 s — ABNORMAL HIGH (ref 11.4–15.2)

## 2023-07-02 LAB — URINALYSIS, W/ REFLEX TO CULTURE (INFECTION SUSPECTED)
Bacteria, UA: NONE SEEN
Bilirubin Urine: NEGATIVE
Glucose, UA: NEGATIVE mg/dL
Hgb urine dipstick: NEGATIVE
Ketones, ur: NEGATIVE mg/dL
Leukocytes,Ua: NEGATIVE
Nitrite: NEGATIVE
Protein, ur: NEGATIVE mg/dL
Specific Gravity, Urine: 1.011 (ref 1.005–1.030)
pH: 6 (ref 5.0–8.0)

## 2023-07-02 LAB — CBC WITH DIFFERENTIAL/PLATELET
Abs Immature Granulocytes: 0.05 10*3/uL (ref 0.00–0.07)
Basophils Absolute: 0 10*3/uL (ref 0.0–0.1)
Basophils Relative: 0 %
Eosinophils Absolute: 0 10*3/uL (ref 0.0–0.5)
Eosinophils Relative: 0 %
HCT: 44.3 % (ref 39.0–52.0)
Hemoglobin: 14 g/dL (ref 13.0–17.0)
Immature Granulocytes: 1 %
Lymphocytes Relative: 5 %
Lymphs Abs: 0.4 10*3/uL — ABNORMAL LOW (ref 0.7–4.0)
MCH: 30.7 pg (ref 26.0–34.0)
MCHC: 31.6 g/dL (ref 30.0–36.0)
MCV: 97.1 fL (ref 80.0–100.0)
Monocytes Absolute: 0.3 10*3/uL (ref 0.1–1.0)
Monocytes Relative: 4 %
Neutro Abs: 8.3 10*3/uL — ABNORMAL HIGH (ref 1.7–7.7)
Neutrophils Relative %: 90 %
Platelets: 173 10*3/uL (ref 150–400)
RBC: 4.56 MIL/uL (ref 4.22–5.81)
RDW: 15.9 % — ABNORMAL HIGH (ref 11.5–15.5)
WBC: 9.1 10*3/uL (ref 4.0–10.5)
nRBC: 1.1 % — ABNORMAL HIGH (ref 0.0–0.2)

## 2023-07-02 LAB — RESP PANEL BY RT-PCR (RSV, FLU A&B, COVID)  RVPGX2
Influenza A by PCR: POSITIVE — AB
Influenza B by PCR: NEGATIVE
Resp Syncytial Virus by PCR: NEGATIVE
SARS Coronavirus 2 by RT PCR: NEGATIVE

## 2023-07-02 LAB — BLOOD GAS, VENOUS
Acid-Base Excess: 18.8 mmol/L — ABNORMAL HIGH (ref 0.0–2.0)
Bicarbonate: 50.4 mmol/L — ABNORMAL HIGH (ref 20.0–28.0)
Drawn by: 1528
O2 Saturation: 87.1 %
Patient temperature: 36.8
pCO2, Ven: 99 mm[Hg] (ref 44–60)
pH, Ven: 7.31 (ref 7.25–7.43)
pO2, Ven: 57 mm[Hg] — ABNORMAL HIGH (ref 32–45)

## 2023-07-02 LAB — LACTIC ACID, PLASMA: Lactic Acid, Venous: 3.9 mmol/L (ref 0.5–1.9)

## 2023-07-02 LAB — SURGICAL PATHOLOGY

## 2023-07-02 LAB — BRAIN NATRIURETIC PEPTIDE: B Natriuretic Peptide: 255 pg/mL — ABNORMAL HIGH (ref 0.0–100.0)

## 2023-07-02 MED ORDER — APIXABAN 5 MG PO TABS
5.0000 mg | ORAL_TABLET | Freq: Two times a day (BID) | ORAL | Status: DC
  Administered 2023-07-02 – 2023-07-10 (×16): 5 mg via ORAL
  Filled 2023-07-02 (×17): qty 1

## 2023-07-02 MED ORDER — ALFUZOSIN HCL ER 10 MG PO TB24
10.0000 mg | ORAL_TABLET | Freq: Every day | ORAL | Status: DC
  Administered 2023-07-03 – 2023-07-10 (×8): 10 mg via ORAL
  Filled 2023-07-02 (×11): qty 1

## 2023-07-02 MED ORDER — LACTATED RINGERS IV BOLUS
1000.0000 mL | Freq: Once | INTRAVENOUS | Status: AC
  Administered 2023-07-02: 1000 mL via INTRAVENOUS

## 2023-07-02 MED ORDER — VALACYCLOVIR HCL 500 MG PO TABS
1000.0000 mg | ORAL_TABLET | Freq: Every day | ORAL | Status: DC
  Administered 2023-07-03 – 2023-07-10 (×8): 1000 mg via ORAL
  Filled 2023-07-02 (×11): qty 2

## 2023-07-02 MED ORDER — SENNOSIDES-DOCUSATE SODIUM 8.6-50 MG PO TABS
2.0000 | ORAL_TABLET | Freq: Every day | ORAL | Status: DC
  Administered 2023-07-03 – 2023-07-09 (×7): 2 via ORAL
  Filled 2023-07-02 (×7): qty 2

## 2023-07-02 MED ORDER — POTASSIUM CHLORIDE IN NACL 40-0.9 MEQ/L-% IV SOLN
INTRAVENOUS | Status: DC
  Filled 2023-07-02 (×2): qty 1000

## 2023-07-02 MED ORDER — POLYETHYLENE GLYCOL 3350 17 G PO PACK
17.0000 g | PACK | Freq: Every day | ORAL | Status: DC
  Administered 2023-07-03 – 2023-07-10 (×8): 17 g via ORAL
  Filled 2023-07-02 (×8): qty 1

## 2023-07-02 MED ORDER — FLUDROCORTISONE ACETATE 0.1 MG PO TABS
0.1000 mg | ORAL_TABLET | Freq: Every day | ORAL | Status: DC
  Filled 2023-07-02: qty 1

## 2023-07-02 MED ORDER — LEVOTHYROXINE SODIUM 100 MCG PO TABS
100.0000 ug | ORAL_TABLET | Freq: Every day | ORAL | Status: DC
  Administered 2023-07-03 – 2023-07-10 (×8): 100 ug via ORAL
  Filled 2023-07-02 (×3): qty 1
  Filled 2023-07-02: qty 2
  Filled 2023-07-02 (×4): qty 1

## 2023-07-02 MED ORDER — PANTOPRAZOLE SODIUM 40 MG PO TBEC
40.0000 mg | DELAYED_RELEASE_TABLET | Freq: Every day | ORAL | Status: DC
  Administered 2023-07-03 – 2023-07-10 (×8): 40 mg via ORAL
  Filled 2023-07-02 (×8): qty 1

## 2023-07-02 MED ORDER — ACETAMINOPHEN 325 MG PO TABS: 650.0000 mg | ORAL_TABLET | Freq: Four times a day (QID) | ORAL | Status: DC | PRN

## 2023-07-02 MED ORDER — METHOCARBAMOL 500 MG PO TABS
500.0000 mg | ORAL_TABLET | Freq: Four times a day (QID) | ORAL | Status: DC | PRN
  Administered 2023-07-04 – 2023-07-09 (×5): 500 mg via ORAL
  Filled 2023-07-02 (×5): qty 1

## 2023-07-02 MED ORDER — MIDODRINE HCL 5 MG PO TABS
10.0000 mg | ORAL_TABLET | Freq: Two times a day (BID) | ORAL | Status: DC
  Administered 2023-07-03 – 2023-07-04 (×3): 10 mg via ORAL
  Filled 2023-07-02 (×3): qty 2

## 2023-07-02 MED ORDER — POTASSIUM CHLORIDE CRYS ER 20 MEQ PO TBCR
40.0000 meq | EXTENDED_RELEASE_TABLET | Freq: Once | ORAL | Status: AC
  Administered 2023-07-02: 40 meq via ORAL
  Filled 2023-07-02: qty 2

## 2023-07-02 MED ORDER — PREDNISONE 10 MG PO TABS
10.0000 mg | ORAL_TABLET | Freq: Every day | ORAL | Status: DC
  Administered 2023-07-03: 10 mg via ORAL
  Filled 2023-07-02: qty 1

## 2023-07-02 MED ORDER — FERROUS SULFATE 325 (65 FE) MG PO TABS
325.0000 mg | ORAL_TABLET | Freq: Every day | ORAL | Status: DC
  Administered 2023-07-03 – 2023-07-10 (×8): 325 mg via ORAL
  Filled 2023-07-02 (×8): qty 1

## 2023-07-02 MED ORDER — MAGNESIUM SULFATE 2 GM/50ML IV SOLN
2.0000 g | Freq: Once | INTRAVENOUS | Status: AC
  Administered 2023-07-02: 2 g via INTRAVENOUS
  Filled 2023-07-02: qty 50

## 2023-07-02 MED ORDER — ROSUVASTATIN CALCIUM 10 MG PO TABS
5.0000 mg | ORAL_TABLET | Freq: Every day | ORAL | Status: DC
  Administered 2023-07-03 – 2023-07-09 (×7): 5 mg via ORAL
  Filled 2023-07-02 (×8): qty 1

## 2023-07-02 MED ORDER — VITAMIN B-12 1000 MCG PO TABS
1000.0000 ug | ORAL_TABLET | Freq: Every day | ORAL | Status: DC
  Administered 2023-07-03: 1000 ug via ORAL
  Filled 2023-07-02: qty 1

## 2023-07-02 MED ORDER — MAGNESIUM OXIDE -MG SUPPLEMENT 400 (240 MG) MG PO TABS
400.0000 mg | ORAL_TABLET | Freq: Every day | ORAL | Status: DC
  Administered 2023-07-03: 400 mg via ORAL
  Filled 2023-07-02: qty 1

## 2023-07-02 MED ORDER — OSELTAMIVIR PHOSPHATE 75 MG PO CAPS
75.0000 mg | ORAL_CAPSULE | Freq: Once | ORAL | Status: AC
  Administered 2023-07-02: 75 mg via ORAL
  Filled 2023-07-02: qty 1

## 2023-07-02 MED ORDER — PREGABALIN 75 MG PO CAPS
200.0000 mg | ORAL_CAPSULE | Freq: Two times a day (BID) | ORAL | Status: DC
  Administered 2023-07-02 – 2023-07-10 (×16): 200 mg via ORAL
  Filled 2023-07-02 (×7): qty 2
  Filled 2023-07-02: qty 1
  Filled 2023-07-02 (×6): qty 2
  Filled 2023-07-02: qty 1
  Filled 2023-07-02: qty 2

## 2023-07-02 NOTE — Assessment & Plan Note (Signed)
 Patient with recent admission with moderate Heart failure - BNP 444. Last Echo May '24  Plan Continue furosemide but change to IV  Echo  Add ARB

## 2023-07-02 NOTE — Assessment & Plan Note (Signed)
 Patient on florinef at home after last discharge 06/29/23. Now with acute CAP-influenza A.  Plan Hold florinef  Stress dose hydrocortisone.   Continue midorine  K replacement

## 2023-07-02 NOTE — ED Triage Notes (Signed)
 Pt c/o sob and had fall at home. Pt was in 70s at home on room air. Pt was recently diagnosed and discharged with PNA.

## 2023-07-02 NOTE — Assessment & Plan Note (Signed)
 Patient in stable a fib/flutter with controlled rate  Plan Continue eliquis

## 2023-07-02 NOTE — Assessment & Plan Note (Signed)
 BP soft at admission. He did receive 1 liter IVF with improvement in BP  Plan Continue furosemide

## 2023-07-02 NOTE — Assessment & Plan Note (Signed)
 Patient with influenza A with hypercarbic respiratory failure. CXR unchanged from discharge CXR.  Plan  ICU admit  BiPAP for hypercarbia with bleed in oxygen to keep O2 sat > 88%  Procalcitonin - with left shift but normal WBC if positive will add abx coverage  Continue tamiflu

## 2023-07-02 NOTE — Assessment & Plan Note (Signed)
 Associated with adrenal insufficiency in the past. NOw with K 2.7  Plan Potassium replacement IV  F/ BMet

## 2023-07-02 NOTE — Assessment & Plan Note (Signed)
 TSH 6.9, FT4 1.9  Plan Continue present dose of levothyroxine

## 2023-07-02 NOTE — ED Provider Notes (Addendum)
 [place] EMERGENCY DEPARTMENT AT [facility] Provider Note   CSN: [id number] Arrival date & time: 07/02/23  1705     History  Chief Complaint  Patient presents with   Shortness of Breath    Roy [name] is a 73 y.o. male.  HPI     73 y.o. year old male with medical history of atrial fibrillation maintained on Eliquis, adrenal insufficiency, diffuse large B-cell lymphoma, GERD, high cholesterol, hypertension and recent admission for acute hypoxic respiratory failure because of CHF comes in with chief complaint of shortness of breath.  Per patient, he was just discharged on the hospital few days ago.  He has been taking his medication as prescribed.  However, today he feels weaker than usual.  He was being walked to the chair by his family, but his legs just gave out.  He was having profound shortness of breath.  He has had shortness of breath with exertion since discharge, unable to walk in his house.  Patient has a cough.  Cough is producing thick phlegm. Per EMS, patient had O2 sats in the 70s when they arrived.  He arrived to the ER with blood pressure systolic in the 70s.  Patient has been taking his medications as prescribed.  Home Medications Prior to Admission medications   Medication Sig Start Date End Date Taking? Authorizing Provider  acetaminophen (TYLENOL) 325 MG tablet Take 2 tablets (650 mg total) by mouth every 6 (six) hours as needed for mild pain (pain score 1-3), fever or headache (or Fever >/= 101). 06/29/23   Emokpae, Courage, MD  alfuzosin (UROXATRAL) 10 MG 24 hr tablet TAKE ONE TABLET DAILY WITH BREAKFAST 06/24/23   Sharee Holster, NP  apixaban (ELIQUIS) 5 MG TABS tablet Take 1 tablet (5 mg total) by mouth 2 (two) times daily. 06/24/23   Sharee Holster, NP  calcium carbonate (OS-CAL - DOSED IN MG OF ELEMENTAL CALCIUM) 1250 (500 Ca) MG tablet Take 1 tablet (1,250 mg total) by mouth 2 (two) times daily with a meal. 06/06/23   Osvaldo Shipper, MD   ferrous sulfate 325 (65 FE) MG tablet Take 1 tablet (325 mg total) by mouth daily with breakfast. 06/24/23   Sharee Holster, NP  fludrocortisone (FLORINEF) 0.1 MG tablet Take 1 tablet (0.1 mg total) by mouth daily. 06/29/23   Shon Hale, MD  furosemide (LASIX) 20 MG tablet Take 1 tablet (20 mg total) by mouth daily. 06/29/23 06/28/24  Shon Hale, MD  levothyroxine (SYNTHROID) 100 MCG tablet Take 1 tablet (100 mcg total) by mouth daily before breakfast. 06/24/23   Sharee Holster, NP  magnesium oxide (MAG-OX) 400 (240 Mg) MG tablet Take 1 tablet (400 mg total) by mouth daily. 06/24/23   Sharee Holster, NP  methocarbamol (ROBAXIN) 500 MG tablet Take 1 tablet (500 mg total) by mouth every 6 (six) hours as needed for muscle spasms. 06/24/23   Sharee Holster, NP  midodrine (PROAMATINE) 10 MG tablet Take 1 tablet (10 mg total) by mouth 2 (two) times daily with a meal. 06/29/23   Emokpae, Courage, MD  pantoprazole (PROTONIX) 40 MG tablet Take 1 tablet (40 mg total) by mouth daily. 06/29/23 06/28/24  Shon Hale, MD  polyethylene glycol (MIRALAX) 17 g packet Take 17 g by mouth daily. 06/29/23   Shon Hale, MD  predniSONE (DELTASONE) 10 MG tablet Take 1 tablet (10 mg total) by mouth daily with breakfast. 06/24/23   Sharee Holster, NP  pregabalin (LYRICA) 200  MG capsule Take 1 capsule (200 mg total) by mouth 2 (two) times daily. 06/24/23   Sharee Holster, NP  rosuvastatin (CRESTOR) 5 MG tablet Take 1 tablet (5 mg total) by mouth at bedtime. 06/24/23   Sharee Holster, NP  senna-docusate (SENOKOT-S) 8.6-50 MG tablet Take 2 tablets by mouth at bedtime. 06/29/23   Shon Hale, MD  sodium chloride (OCEAN) 0.65 % SOLN nasal spray Place 2 sprays into both nostrils as needed for congestion. 06/29/23   Shon Hale, MD  valACYclovir (VALTREX) 1000 MG tablet Take 1 tablet (1,000 mg total) by mouth daily. 06/24/23   Sharee Holster, NP  vitamin B-12 (CYANOCOBALAMIN) 1000 MCG tablet Take 1  tablet (1,000 mcg total) by mouth daily. 02/28/21   Carnella Guadalajara, PA-C  Vitamin D, Ergocalciferol, (DRISDOL) 1.25 MG (50000 UNIT) CAPS capsule Take 1 capsule (50,000 Units total) by mouth every 7 (seven) days. 06/24/23   Sharee Holster, NP      Allergies    Adhesive [tape] and Sulfa antibiotics    Review of Systems   Review of Systems  All other systems reviewed and are negative.   Physical Exam Updated Vital Signs BP (!) 102/56   Pulse 64   Temp 98.3 F (36.8 C) (Oral)   Resp 18   Ht 6' (1.829 m)   Wt 124.5 kg   SpO2 96%   BMI 37.23 kg/m  Physical Exam Vitals and nursing note reviewed.  Constitutional:      General: He is in acute distress.     Appearance: He is well-developed. He is ill-appearing. He is not toxic-appearing.  HENT:     Head: Atraumatic.  Cardiovascular:     Rate and Rhythm: Normal rate.  Pulmonary:     Effort: Pulmonary effort is normal.     Breath sounds: Wheezing and rhonchi present. No rales.  Musculoskeletal:     Cervical back: Neck supple.     Right lower leg: No edema.     Left lower leg: No edema.  Skin:    General: Skin is warm.  Neurological:     Mental Status: He is alert and oriented to person, place, and time.     ED Results / Procedures / Treatments   Labs (all labs ordered are listed, but only abnormal results are displayed) Labs Reviewed  RESP PANEL BY RT-PCR (RSV, FLU A&B, COVID)  RVPGX2 - Abnormal; Notable for the following components:      Result Value   Influenza A by PCR POSITIVE (*)    All other components within normal limits  LACTIC ACID, PLASMA - Abnormal; Notable for the following components:   Lactic Acid, Venous 3.9 (*)    All other components within normal limits  COMPREHENSIVE METABOLIC PANEL - Abnormal; Notable for the following components:   Potassium 2.7 (*)    Chloride 85 (*)    CO2 36 (*)    Glucose, Bld 162 (*)    Calcium 8.6 (*)    Total Protein 5.8 (*)    Albumin 3.2 (*)    Alkaline  Phosphatase 136 (*)    Total Bilirubin 1.7 (*)    All other components within normal limits  CBC WITH DIFFERENTIAL/PLATELET - Abnormal; Notable for the following components:   RDW 15.9 (*)    nRBC 1.1 (*)    Neutro Abs 8.3 (*)    Lymphs Abs 0.4 (*)    All other components within normal limits  CULTURE, BLOOD (ROUTINE X 2)  CULTURE, BLOOD (ROUTINE X 2)  LACTIC ACID, PLASMA  URINALYSIS, W/ REFLEX TO CULTURE (INFECTION SUSPECTED)  PROTIME-INR  BRAIN NATRIURETIC PEPTIDE    EKG EKG Interpretation Date/Time:  Tuesday July 02 2023 17:06:18 EST Ventricular Rate:  76 PR Interval:    QRS Duration:  165 QT Interval:  470 QTC Calculation: 529 R Axis:   -35  Text Interpretation: Afib/flut and V-paced complexes No further analysis attempted due to paced rhythm VENTRICULAR PACED RHYTHM Confirmed by Derwood Kaplan [id number]) on 07/02/2023 6:30:23 PM  Radiology DG Chest Portable 1 View Result Date: 07/02/2023 CLINICAL DATA:  Shortness of breath with fall at home. Hypoxemia. Recent hospitalization for pneumonia. EXAM: PORTABLE CHEST 1 VIEW COMPARISON:  Radiographs 06/28/2023 and 06/25/2023.  CT 06/25/2023. FINDINGS: 1740 hours. Right IJ Port-A-Cath extends to the level of the mid right atrium. Left subclavian pacemaker leads are grossly unchanged, projecting over the right atrium and right ventricle. Stable cardiomegaly and vascular congestion. No significant change in residual left basilar opacity and probable small left pleural effusion. No progressive airspace disease, edema or pneumothorax demonstrated. The bones appear unchanged. IMPRESSION: No significant change in residual left basilar opacity and probable small left pleural effusion. No progressive airspace disease or edema. Electronically Signed   By: Carey Bullocks M.D.   On: 07/02/2023 18:17    Procedures .Critical Care  Performed by: Derwood Kaplan, MD Authorized by: Derwood Kaplan, MD   Critical care provider statement:     Critical care time (minutes):  39   Critical care was necessary to treat or prevent imminent or life-threatening deterioration of the following conditions:  Circulatory failure, respiratory failure and endocrine crisis   Critical care was time spent personally by me on the following activities:  Development of treatment plan with patient or surrogate, discussions with consultants, evaluation of patient's response to treatment, examination of patient, ordering and review of laboratory studies, ordering and review of radiographic studies, ordering and performing treatments and interventions, pulse oximetry, re-evaluation of patient's condition and review of old charts     Medications Ordered in ED Medications  potassium chloride SA (KLOR-CON M) CR tablet 40 mEq (has no administration in time range)  magnesium sulfate IVPB 2 g 50 mL (has no administration in time range)  oseltamivir (TAMIFLU) capsule 75 mg (has no administration in time range)  lactated ringers bolus 1,000 mL (1,000 mLs Intravenous New Bag/Given 07/02/23 1728)    ED Course/ Medical Decision Making/ A&P                                 Medical Decision Making Amount and/or Complexity of Data Reviewed Labs: ordered. Radiology: ordered.  Risk Prescription drug management. Decision regarding hospitalization.   This patient presents to the ED with chief complaint(s) of shortness of breath, severe weakness with pertinent past medical history of CHF, A-fib, renal insufficiency and recent admission for acute hypoxic respiratory failure because of CHF.The complaint involves an extensive differential diagnosis and also carries with it a high risk of complications and morbidity.    The differential diagnosis includes : Cardiogenic shock, septic shock, acute CHF.  COVID-19/flu, orthostatic hypotension, severe dehydration, AKI, arrhythmia  The initial plan is to get basic labs. Will get chest x-ray as well.  Patient arrives to the  ER hypotensive and hypoxic.  I have requested that we give him 1 L IV fluid over 2 hours.  Patient's BP responded rather quickly, which is reassuring.  Patient had a recent CT PE which was normal, therefore I do not think we need to pursue pulmonary embolism.   Additional history obtained: Additional history obtained from EMS  Records reviewed previous admission documents  Independent labs interpretation:  The following labs were independently interpreted: Flu positive.  Potassium is 2.7.  Lactic acid 3.9.  Creatinine also elevated.  Independent visualization and interpretation of imaging: - I independently visualized the following imaging with scope of interpretation limited to determining acute life threatening conditions related to emergency care: X-ray of the chest, which revealed mild pleural effusion bilaterally.  No large focal consolidation.  Treatment and Reassessment: Patient is stable. Flu + Clinically, does not appear to be a bacterial pneumonia.  No need for antibiotics.  Will give him Tamiflu.  We will give him oral potassium, IV magnesium and admit him to the hospital.   Final Clinical Impression(s) / ED Diagnoses Final diagnoses:  Acute hypoxemic respiratory failure (HCC)  Influenza A  Acute hypokalemia    Rx / DC Orders ED Discharge Orders     None         Derwood Kaplan, MD 07/02/23 Verdie Shire, MD 07/02/23 2035

## 2023-07-03 DIAGNOSIS — E876 Hypokalemia: Secondary | ICD-10-CM

## 2023-07-03 DIAGNOSIS — J9622 Acute and chronic respiratory failure with hypercapnia: Secondary | ICD-10-CM

## 2023-07-03 DIAGNOSIS — I4821 Permanent atrial fibrillation: Secondary | ICD-10-CM

## 2023-07-03 DIAGNOSIS — J9621 Acute and chronic respiratory failure with hypoxia: Secondary | ICD-10-CM

## 2023-07-03 DIAGNOSIS — J189 Pneumonia, unspecified organism: Secondary | ICD-10-CM

## 2023-07-03 DIAGNOSIS — E039 Hypothyroidism, unspecified: Secondary | ICD-10-CM

## 2023-07-03 DIAGNOSIS — J9601 Acute respiratory failure with hypoxia: Secondary | ICD-10-CM

## 2023-07-03 DIAGNOSIS — E2740 Unspecified adrenocortical insufficiency: Secondary | ICD-10-CM

## 2023-07-03 DIAGNOSIS — J101 Influenza due to other identified influenza virus with other respiratory manifestations: Secondary | ICD-10-CM

## 2023-07-03 LAB — CBC WITH DIFFERENTIAL/PLATELET
Abs Immature Granulocytes: 0.02 10*3/uL (ref 0.00–0.07)
Basophils Absolute: 0 10*3/uL (ref 0.0–0.1)
Basophils Relative: 0 %
Eosinophils Absolute: 0 10*3/uL (ref 0.0–0.5)
Eosinophils Relative: 0 %
HCT: 47.5 % (ref 39.0–52.0)
Hemoglobin: 14.6 g/dL (ref 13.0–17.0)
Immature Granulocytes: 0 %
Lymphocytes Relative: 10 %
Lymphs Abs: 0.8 10*3/uL (ref 0.7–4.0)
MCH: 30.2 pg (ref 26.0–34.0)
MCHC: 30.7 g/dL (ref 30.0–36.0)
MCV: 98.3 fL (ref 80.0–100.0)
Monocytes Absolute: 0.4 10*3/uL (ref 0.1–1.0)
Monocytes Relative: 5 %
Neutro Abs: 6.8 10*3/uL (ref 1.7–7.7)
Neutrophils Relative %: 85 %
Platelets: 176 10*3/uL (ref 150–400)
RBC: 4.83 MIL/uL (ref 4.22–5.81)
RDW: 16.1 % — ABNORMAL HIGH (ref 11.5–15.5)
WBC: 8 10*3/uL (ref 4.0–10.5)
nRBC: 1.4 % — ABNORMAL HIGH (ref 0.0–0.2)

## 2023-07-03 LAB — BLOOD GAS, ARTERIAL
Acid-Base Excess: 25.2 mmol/L — ABNORMAL HIGH (ref 0.0–2.0)
Bicarbonate: 57.6 mmol/L — ABNORMAL HIGH (ref 20.0–28.0)
Drawn by: 41977
O2 Saturation: 86.5 %
Patient temperature: 36.7
pCO2 arterial: 101 mm[Hg] (ref 32–48)
pH, Arterial: 7.36 (ref 7.35–7.45)
pO2, Arterial: 53 mm[Hg] — ABNORMAL LOW (ref 83–108)

## 2023-07-03 LAB — PROCALCITONIN: Procalcitonin: 0.18 ng/mL

## 2023-07-03 LAB — COMPREHENSIVE METABOLIC PANEL
ALT: 22 U/L (ref 0–44)
AST: 25 U/L (ref 15–41)
Albumin: 3.3 g/dL — ABNORMAL LOW (ref 3.5–5.0)
Alkaline Phosphatase: 134 U/L — ABNORMAL HIGH (ref 38–126)
Anion gap: 12 (ref 5–15)
BUN: 9 mg/dL (ref 8–23)
CO2: 42 mmol/L — ABNORMAL HIGH (ref 22–32)
Calcium: 8.4 mg/dL — ABNORMAL LOW (ref 8.9–10.3)
Chloride: 87 mmol/L — ABNORMAL LOW (ref 98–111)
Creatinine, Ser: 0.77 mg/dL (ref 0.61–1.24)
GFR, Estimated: >60 mL/min (ref >60)
Glucose, Bld: 102 mg/dL — ABNORMAL HIGH (ref 70–99)
Potassium: 3.6 mmol/L (ref 3.5–5.1)
Sodium: 141 mmol/L (ref 135–145)
Total Bilirubin: 1.6 mg/dL — ABNORMAL HIGH (ref 0.0–1.2)
Total Protein: 5.9 g/dL — ABNORMAL LOW (ref 6.5–8.1)

## 2023-07-03 LAB — MRSA NEXT GEN BY PCR, NASAL: MRSA by PCR Next Gen: NOT DETECTED

## 2023-07-03 MED ORDER — HYDROCORTISONE SOD SUC (PF) 100 MG IJ SOLR
100.0000 mg | Freq: Every day | INTRAMUSCULAR | Status: DC
  Administered 2023-07-03: 100 mg via INTRAVENOUS
  Filled 2023-07-03: qty 2

## 2023-07-03 MED ORDER — CHLORHEXIDINE GLUCONATE CLOTH 2 % EX PADS
6.0000 | MEDICATED_PAD | Freq: Every day | CUTANEOUS | Status: DC
  Administered 2023-07-04 – 2023-07-08 (×5): 6 via TOPICAL

## 2023-07-03 MED ORDER — LOSARTAN POTASSIUM 25 MG PO TABS
25.0000 mg | ORAL_TABLET | Freq: Every day | ORAL | Status: DC
  Administered 2023-07-03: 25 mg via ORAL
  Filled 2023-07-03: qty 1

## 2023-07-03 MED ORDER — FUROSEMIDE 10 MG/ML IJ SOLN
40.0000 mg | Freq: Two times a day (BID) | INTRAMUSCULAR | Status: DC
  Administered 2023-07-03 (×2): 40 mg via INTRAVENOUS
  Filled 2023-07-03 (×2): qty 4

## 2023-07-03 MED ORDER — OSELTAMIVIR PHOSPHATE 75 MG PO CAPS
75.0000 mg | ORAL_CAPSULE | Freq: Two times a day (BID) | ORAL | Status: AC
  Administered 2023-07-03 – 2023-07-07 (×9): 75 mg via ORAL
  Filled 2023-07-03 (×9): qty 1

## 2023-07-03 MED ORDER — SODIUM CHLORIDE 0.9 % IV BOLUS
500.0000 mL | Freq: Once | INTRAVENOUS | Status: AC
  Administered 2023-07-03: 500 mL via INTRAVENOUS

## 2023-07-03 NOTE — Progress Notes (Signed)
 Patient placed back on BiPAP, settings per RT

## 2023-07-03 NOTE — Progress Notes (Signed)
 Patient refusing BiPAP at this time. Placed on HFNC @ 6LPM. Will attempt to place patient back on BiPAP after nighttime med pass.

## 2023-07-03 NOTE — Subjective & Objective (Signed)
 [name], a 73 y.o. year old male with medical history of atrial fibrillation maintained on Eliquis, adrenal insufficiency, diffuse large B-cell lymphoma, GERD, high cholesterol, hypertension, hospitalization Jan 2025 for posterior spinal fusion with a complicated course and recent admission 2/18-2/22/25 for acute hypoxic respiratory failure because of CHF.Per patient record he was discharged from the hospital 06/29/23 to SNF for rehab.  He has been taking his medication as prescribed. Today he feels weaker than usual.  At rehab He was being walked to the chair by his family, but his legs just gave out.  He was having profound shortness of breath.  He has had shortness of breath with exertion since discharge, unable to walk in his house. EMS activated and on arrival EMT report O2 sats in the 70's. He was transported to [facility] for evaluation.

## 2023-07-03 NOTE — Plan of Care (Signed)
°  Problem: Education: Goal: Knowledge of General Education information will improve Description: Including pain rating scale, medication(s)/side effects and non-pharmacologic comfort measures Outcome: Progressing   Problem: Health Behavior/Discharge Planning: Goal: Ability to manage health-related needs will improve Outcome: Progressing   Problem: Clinical Measurements: Goal: Ability to maintain clinical measurements within normal limits will improve Outcome: Progressing Goal: Will remain free from infection Outcome: Progressing Goal: Diagnostic test results will improve Outcome: Progressing Goal: Respiratory complications will improve Outcome: Progressing Goal: Cardiovascular complication will be avoided Outcome: Progressing   Problem: Activity: Goal: Risk for activity intolerance will decrease Outcome: Progressing   Problem: Nutrition: Goal: Adequate nutrition will be maintained Outcome: Progressing   Problem: Nutrition: Goal: Adequate nutrition will be maintained Outcome: Progressing

## 2023-07-03 NOTE — Progress Notes (Signed)
 Pt asked to take a break from BiPAP. Pt alert and oriented with No increased WOB. Pt placed on 4L [place] SPO2 93%

## 2023-07-03 NOTE — Progress Notes (Signed)
 Date and time results received: 07/03/23 1720 (use smartphrase ".now" to insert current time)  Test: ABG   Critical Value: PCO2 101  Name of Provider Notified: Dr Gwenlyn Perking  Orders Received? Or Actions Taken?:  Place back on bipap and keep on for at least 8 hours

## 2023-07-03 NOTE — TOC Initial Note (Signed)
 Transition of Care [facility]) - Initial/Assessment Note    Patient Details  Name: Roy [name] MRN: [id number] Date of Birth: [birth year]  [facility] CM/SW Contact:    Karn Cassis, LCSW Phone Number: 07/03/2023, 8:47 AM  Clinical Narrative: Pt admitted with community acquired pneumonia.  Assessment completed due to high risk readmission score. TOC received consult for SNF and H&P indicates pt is from SNF. However, pt d/c from [facility] over a week ago. He lives alone and was set up with [facility]/OT. Jennifer with [facility] notified of admission. Pt's son reports pt is slowly improving and plan is for return home at this point. TOC will follow and keep [facility] updated. Will need resumption orders at d/c.                  Expected Discharge Plan: Home w Home Health Services Barriers to Discharge: Continued Medical Work up   Patient Goals and CMS Choice Patient states their goals for this hospitalization and ongoing recovery are:: return home   Choice offered to / list presented to : Adult Children [place] ownership interest in [facility]provided to::  (n/a)    Expected Discharge Plan and Services In-house Referral: Clinical Social Work   Post Acute Care Choice: Home Health Living arrangements for the past 2 months: Single Family Home                           HH Arranged: PT, OT HH Agency: [facility] Date HH Agency Contacted: 07/03/23 Time HH Agency Contacted: [phone] Representative spoke with at [facility] Agency: Victorino Dike  Prior Living Arrangements/Services Living arrangements for the past 2 months: Single Family Home Lives with:: Self Patient language and need for interpreter reviewed:: Yes Do you feel safe going back to the place where you live?: Yes      Need for Family Participation in Patient Care: No (Comment)   Current home services: DME (cane, walker) Criminal Activity/Legal Involvement Pertinent to Current  Situation/Hospitalization: No - Comment as needed  Activities of Daily Living   ADL Screening (condition at time of admission) Independently performs ADLs?: Yes (appropriate for developmental age) Does the patient have a NEW difficulty with bathing/dressing/toileting/self-feeding that is expected to last >3 days?: No Does the patient have a NEW difficulty with getting in/out of bed, walking, or climbing stairs that is expected to last >3 days?: No Does the patient have a NEW difficulty with communication that is expected to last >3 days?: No Is the patient deaf or have difficulty hearing?: No Does the patient have difficulty seeing, even when wearing glasses/contacts?: No Does the patient have difficulty concentrating, remembering, or making decisions?: No  Permission Sought/Granted                  Emotional Assessment         Alcohol / Substance Use: Not Applicable Psych Involvement: No (comment)  Admission diagnosis:  CAP (community acquired pneumonia) [J18.9] Patient Active Problem List   Diagnosis Date Noted   CAP (community acquired pneumonia) due to influenza A virus 07/02/2023   CAP (community acquired pneumonia) 07/02/2023   Acute hypoxic respiratory failure (HCC) 06/25/2023   Acute on chronic heart failure with preserved ejection fraction (HFpEF) (HCC) 06/25/2023   GI bleed 06/25/2023   Benign hypertension with coincident congestive heart failure (HCC) 06/10/2023   Chronic constipation 06/10/2023   Vitamin B 12 deficiency 06/10/2023   Hypomagnesuria 06/10/2023  Peripheral neuropathy 06/10/2023   Aortic atherosclerosis (HCC) 06/10/2023   T11 vertebral fracture (HCC) 05/24/2023   Urge incontinence 11/05/2022   Symptomatic bradycardia 09/05/2022   Acute congestive heart failure (HCC) 09/05/2022   Parotid neoplasm 12/28/2021   Mononeuropathy of left lower limb 12/28/2021   Neutropenic fever (HCC) 07/14/2021   Hypothyroidism 07/14/2021   Hypotension 07/14/2021    Hyperlipidemia 07/14/2021   BPH (benign prostatic hyperplasia) 07/14/2021   GERD (gastroesophageal reflux disease) 07/14/2021   Leukopenia    Thrombocytopenia (HCC)    Large B-cell lymphoma (HCC) 03/22/2021   Varicose veins of bilateral lower extremities with pain 02/02/2021   Venous (peripheral) insufficiency 02/02/2021   Subacute thyroiditis 01/20/2021   Adrenal insufficiency (HCC) 01/10/2021   Hyponatremia 01/08/2021   Weakness 01/08/2021   Nonrheumatic aortic valve stenosis 12/20/2020   Hyperthyroidism 10/22/2020   Coagulopathy (HCC) 10/21/2020   Obesity (BMI 30-39.9) 05/04/2015   Chronic anticoagulation 05/04/2015   Trifascicular block 04/29/2015   LAFB (left anterior fascicular block) 04/29/2015   Essential hypertension    Hypokalemia 04/28/2015   RBBB 04/28/2015   First degree AV block 04/28/2015   Atrial fibrillation (HCC) 04/27/2015   PCP:  Rebecka Apley, NP Pharmacy:   [facility] [place], [place] - [address] [place] [address] Phone: [phone] Fax: [phone]  [facility]- - [place], [place] - [address] [place] [address] Phone: [phone] Fax: [phone]  [id number] - [place], [place] - [address] AT [facility] [address] [place] [address] Phone: [phone] Fax: [phone]     Social Drivers of Health (SDOH) Social History: SDOH Screenings   Food Insecurity: No Food Insecurity (07/03/2023)  Housing: Low Risk  (07/03/2023)  Transportation Needs: No Transportation Needs (07/03/2023)  Utilities: Not At Risk (07/03/2023)  Alcohol Screen: Low Risk  (11/08/2020)  Depression (PHQ2-9): Low Risk  (11/08/2020)  Financial Resource Strain: Low Risk  (05/15/2023)   Received from [facility]  Physical Activity: Unknown (04/25/2023)   [facility] from [facility]  Recent Concern: Physical Activity - Inactive (04/25/2023)    Received from [facility]  Social Connections: Moderately Isolated (07/03/2023)  Stress: No Stress Concern Present (04/25/2023)   Received from [facility]  Tobacco Use: Low Risk  (07/02/2023)   SDOH Interventions:     Readmission Risk Interventions    07/03/2023    8:45 AM 06/28/2023    7:56 AM 07/17/2021   12:49 PM  Readmission Risk Prevention Plan  Transportation Screening Complete Complete Complete  HRI or Home Care Consult Complete Complete Complete  Social Work Consult for Recovery Care Planning/Counseling Complete Complete Complete  Palliative Care Screening Not Applicable Not Applicable Not Applicable  Medication Review Oceanographer) Complete Complete Complete

## 2023-07-03 NOTE — Plan of Care (Signed)

## 2023-07-03 NOTE — Progress Notes (Signed)
 Patient seen and examined; admitted after midnight secondary to SOB, hypoxia and hypercapnia in the setting of influenza A infection. Patient also with underlying hx of diastolic HF with elevated BNP. Please refer to H&P written by Dr. Debby Bud for further info/details on admission.  Plan: -will continue treatment with steroids and tamiflu -continue IV diuresis, daily weights and strict I's & O's -follow O2 saturation and weaned off oxygen as tolerated -continue CPAP at bedtime; will need PFT's as an outpatient. -follow clinical response.  Vassie Loll MD [phone]

## 2023-07-03 NOTE — H&P (Signed)
 History and Physical    JYE FARISS ZOX:[id number] DOB: [birth year] DOA: 07/02/2023  DOS: the patient was seen and examined on 07/02/2023  PCP: Rebecka Apley, NP   Patient coming from: SNF  I have personally briefly reviewed patient's old medical records in [facility]  Mr. Councilman, a 73 y.o. year old male with medical history of atrial fibrillation maintained on Eliquis, adrenal insufficiency, diffuse large B-cell lymphoma, GERD, high cholesterol, hypertension, hospitalization Jan 2025 for posterior spinal fusion with a complicated course and recent admission 2/18-2/22/25 for acute hypoxic respiratory failure because of CHF.Per patient record he was discharged from the hospital 06/29/23 to SNF for rehab.  He has been taking his medication as prescribed. Today he feels weaker than usual.  At rehab He was being walked to the chair by his family, but his legs just gave out.  He was having profound shortness of breath.  He has had shortness of breath with exertion since discharge, unable to walk in his house. EMS activated and on arrival EMT report O2 sats in the 70's. He was transported to [facility] for evaluation.      ED Course: T 98.3  SBP in the 70's on arrival but after IVF bolus BP 103/621 HR 60 RR 24, O2 Sat 80's. He was put to high flow oxygen. At admission exam patient was somnolent. Stat VBG revealed hypercarbia with CO2 99. AFter being put to BiPAP patient was much more awake but could not give much hx due to BiPAP mask. Lab: K 2.7 Glucose 162  Alk phso 135 Albumin 3.2, T. Protein 5.8. BNP 255, WBC 9.5 with 90/5/4, Hgb 14.0, INR 1.6, TSH 6.9, FT$ 1.9. Respiratory panel positive for influenza A. CXR no progression in ASD from prior Xray. EKG a. Fib/flutter. TRH called to admit for CAP-influenza A with hypercarbic respiratory failure.   Review of Systems:  Review of Systems  Unable to perform ROS: Acuity of condition    Past Medical History:  Diagnosis Date   Atrial  fibrillation (HCC)    Cancer (HCC)    Diffuse Large B-Cell lymphoma   GERD (gastroesophageal reflux disease)    High cholesterol    Hypertension    Obesity    Pacemaker     Past Surgical History:  Procedure Laterality Date   APPENDECTOMY     BIOPSY  06/28/2023   Procedure: BIOPSY;  Surgeon: Corbin Ade, MD;  Location: [facility];  Service: Endoscopy;;   CATARACT EXTRACTION Bilateral 2022   CHOLECYSTECTOMY     ESOPHAGEAL DILATION     multiple times   ESOPHAGOGASTRODUODENOSCOPY (EGD) WITH PROPOFOL N/A 06/28/2023   Procedure: ESOPHAGOGASTRODUODENOSCOPY (EGD) WITH PROPOFOL;  Surgeon: Corbin Ade, MD;  Location: [facility];  Service: Endoscopy;  Laterality: N/A;   IR IMAGING GUIDED PORT INSERTION  03/31/2021   JOINT REPLACEMENT Left    hip   LUMBAR PERCUTANEOUS PEDICLE SCREW 3 LEVEL  05/27/2023   Procedure: POSTERIOR FIXATION AND FUSION OF THORACIC ELEVEN-THORACIC TWELVE FRACTURE DISLOCATION. FIXATION FROM THORACIC TEN-THORACIC ELEVEN METHACRYLATE AUGMENTATION WITH FLUOROSCOPIC GUIDANCE;  Surgeon: Barnett Abu, MD;  Location: [facility] OR;  Service: Neurosurgery;;   Elease Hashimoto DILATION N/A 06/28/2023   Procedure: Alvy Beal;  Surgeon: Corbin Ade, MD;  Location: [facility];  Service: Endoscopy;  Laterality: N/A;   PACEMAKER IMPLANT N/A 09/07/2022   Procedure: PACEMAKER IMPLANT;  Surgeon: Regan Lemming, MD;  Location: [facility];  Service: Cardiovascular;  Laterality: N/A;  reports that he has never smoked. He has never been exposed to tobacco smoke. He has never used smokeless tobacco. He reports that he does not drink alcohol and does not use drugs.  Allergies  Allergen Reactions   Adhesive [Tape] Other (See Comments)    Contact dermatitis   Sulfa Antibiotics Other (See Comments)    Unknown childhood reaction    Family History  Problem Relation Age of Onset   Heart failure Father    Heart attack Father        Deceased    Thyroid disease Sister      Prior to Admission medications   Medication Sig Start Date End Date Taking? Authorizing Provider  acetaminophen (TYLENOL) 325 MG tablet Take 2 tablets (650 mg total) by mouth every 6 (six) hours as needed for mild pain (pain score 1-3), fever or headache (or Fever >/= 101). 06/29/23   Emokpae, Courage, MD  alfuzosin (UROXATRAL) 10 MG 24 hr tablet TAKE ONE TABLET DAILY WITH BREAKFAST 06/24/23   Sharee Holster, NP  apixaban (ELIQUIS) 5 MG TABS tablet Take 1 tablet (5 mg total) by mouth 2 (two) times daily. 06/24/23   Sharee Holster, NP  calcium carbonate (OS-CAL - DOSED IN MG OF ELEMENTAL CALCIUM) 1250 (500 Ca) MG tablet Take 1 tablet (1,250 mg total) by mouth 2 (two) times daily with a meal. 06/06/23   Osvaldo Shipper, MD  ferrous sulfate 325 (65 FE) MG tablet Take 1 tablet (325 mg total) by mouth daily with breakfast. 06/24/23   Sharee Holster, NP  fludrocortisone (FLORINEF) 0.1 MG tablet Take 1 tablet (0.1 mg total) by mouth daily. 06/29/23   Shon Hale, MD  furosemide (LASIX) 20 MG tablet Take 1 tablet (20 mg total) by mouth daily. 06/29/23 06/28/24  Shon Hale, MD  levothyroxine (SYNTHROID) 100 MCG tablet Take 1 tablet (100 mcg total) by mouth daily before breakfast. 06/24/23   Sharee Holster, NP  magnesium oxide (MAG-OX) 400 (240 Mg) MG tablet Take 1 tablet (400 mg total) by mouth daily. 06/24/23   Sharee Holster, NP  methocarbamol (ROBAXIN) 500 MG tablet Take 1 tablet (500 mg total) by mouth every 6 (six) hours as needed for muscle spasms. 06/24/23   Sharee Holster, NP  midodrine (PROAMATINE) 10 MG tablet Take 1 tablet (10 mg total) by mouth 2 (two) times daily with a meal. 06/29/23   Emokpae, Courage, MD  pantoprazole (PROTONIX) 40 MG tablet Take 1 tablet (40 mg total) by mouth daily. 06/29/23 06/28/24  Shon Hale, MD  polyethylene glycol (MIRALAX) 17 g packet Take 17 g by mouth daily. 06/29/23   Shon Hale, MD  predniSONE (DELTASONE) 10 MG tablet Take 1 tablet (10 mg  total) by mouth daily with breakfast. 06/24/23   Sharee Holster, NP  pregabalin (LYRICA) 200 MG capsule Take 1 capsule (200 mg total) by mouth 2 (two) times daily. 06/24/23   Sharee Holster, NP  rosuvastatin (CRESTOR) 5 MG tablet Take 1 tablet (5 mg total) by mouth at bedtime. 06/24/23   Sharee Holster, NP  senna-docusate (SENOKOT-S) 8.6-50 MG tablet Take 2 tablets by mouth at bedtime. 06/29/23   Shon Hale, MD  sodium chloride (OCEAN) 0.65 % SOLN nasal spray Place 2 sprays into both nostrils as needed for congestion. 06/29/23   Shon Hale, MD  valACYclovir (VALTREX) 1000 MG tablet Take 1 tablet (1,000 mg total) by mouth daily. 06/24/23   Sharee Holster, NP  vitamin B-12 (CYANOCOBALAMIN) 1000  MCG tablet Take 1 tablet (1,000 mcg total) by mouth daily. 02/28/21   Carnella Guadalajara, PA-C  Vitamin D, Ergocalciferol, (DRISDOL) 1.25 MG (50000 UNIT) CAPS capsule Take 1 capsule (50,000 Units total) by mouth every 7 (seven) days. 06/24/23   Sharee Holster, NP    Physical Exam: Vitals:   07/02/23 1730 07/02/23 1745 07/02/23 1805 07/02/23 2130  BP: 107/72 (!) 102/56  103/62  Pulse: 65 70 64 60  Resp: 20 16 18  (!) 24  Temp:      TempSrc:      SpO2: 94% 96% 96% 100%  Weight:      Height:        Physical Exam Vitals and nursing note reviewed.  Constitutional:      General: He is in acute distress.     Appearance: He is obese. He is ill-appearing and toxic-appearing.     Comments: Inially somnolent but after being put to BiPAP more awake and alert.  Obesity hindered exam  HENT:     Head: Normocephalic and atraumatic.     Mouth/Throat:     Comments: Very dry oropharynx Eyes:     Extraocular Movements: Extraocular movements intact.     Pupils: Pupils are equal, round, and reactive to light.  Cardiovascular:     Rate and Rhythm: Normal rate. Rhythm irregular.     Heart sounds: Normal heart sounds.     Comments: Heart sounds very distant. Pulmonary:     Effort: Bradypnea  and respiratory distress present.     Breath sounds: Decreased breath sounds present.     Comments: Pulmonary exam very limited by girth and inability to move patient Abdominal:     General: Bowel sounds are normal.     Palpations: Abdomen is soft.  Musculoskeletal:     Cervical back: Normal range of motion.     Right lower leg: Edema present.     Left lower leg: Edema present.     Comments: 1+ pitting edema to mid-calf  Skin:    General: Skin is warm and dry.  Neurological:     Comments: Initially obtunded more awake after being put to BiPAP      Labs on Admission: I have personally reviewed following labs and imaging studies  CBC: Recent Labs  Lab 06/26/23 0507 06/26/23 1517 06/27/23 0408 06/28/23 0522 07/02/23 1718  WBC 9.9  --  11.3*  --  9.1  NEUTROABS  --   --   --   --  8.3*  HGB 14.7 13.8 13.8 13.5 14.0  HCT 47.2 44.3 44.0 42.4 44.3  MCV 99.4  --  99.1  --  97.1  PLT 215  --  227  --  173   Basic Metabolic Panel: Recent Labs  Lab 06/26/23 0507 06/27/23 0408 06/28/23 0522 07/02/23 1718  NA 141 138 140 136  K 3.3* 5.0 4.2 2.7*  CL 95* 95* 94* 85*  CO2 36* 36* 37* 36*  GLUCOSE 66* 157* 106* 162*  BUN 17 19 16 11   CREATININE 0.57* 0.62 0.68 0.76  CALCIUM 8.7* 8.7* 9.6 8.6*  PHOS  --   --  2.8  --    GFR: Estimated Creatinine Clearance: 112.1 mL/min (by C-G formula based on SCr of 0.76 mg/dL). Liver Function Tests: Recent Labs  Lab 06/28/23 0522 07/02/23 1718  AST  --  27  ALT  --  22  ALKPHOS  --  136*  BILITOT  --  1.7*  PROT  --  5.8*  ALBUMIN 3.3* 3.2*   No results for input(s): "LIPASE", "AMYLASE" in the last 168 hours. No results for input(s): "AMMONIA" in the last 168 hours. Coagulation Profile: Recent Labs  Lab 07/02/23 1718  INR 1.6*   Cardiac Enzymes: No results for input(s): "CKTOTAL", "CKMB", "CKMBINDEX", "TROPONINI" in the last 168 hours. BNP (last 3 results) No results for input(s): "PROBNP" in the last 8760  hours. HbA1C: No results for input(s): "HGBA1C" in the last 72 hours. CBG: Recent Labs  Lab 06/28/23 1702 06/28/23 1949 06/29/23 0426 06/29/23 0733 06/29/23 1144  GLUCAP 115* 174* 107* 89 116*   Lipid Profile: No results for input(s): "CHOL", "HDL", "LDLCALC", "TRIG", "CHOLHDL", "LDLDIRECT" in the last 72 hours. Thyroid Function Tests: No results for input(s): "TSH", "T4TOTAL", "FREET4", "T3FREE", "THYROIDAB" in the last 72 hours. Anemia Panel: No results for input(s): "VITAMINB12", "FOLATE", "FERRITIN", "TIBC", "IRON", "RETICCTPCT" in the last 72 hours. Urine analysis:    Component Value Date/Time   COLORURINE YELLOW 07/02/2023 1720   APPEARANCEUR CLEAR 07/02/2023 1720   APPEARANCEUR Clear 05/09/2023 1355   LABSPEC 1.011 07/02/2023 1720   PHURINE 6.0 07/02/2023 1720   GLUCOSEU NEGATIVE 07/02/2023 1720   HGBUR NEGATIVE 07/02/2023 1720   BILIRUBINUR NEGATIVE 07/02/2023 1720   BILIRUBINUR Negative 05/09/2023 1355   KETONESUR NEGATIVE 07/02/2023 1720   PROTEINUR NEGATIVE 07/02/2023 1720   UROBILINOGEN 1.0 05/15/2010 2059   NITRITE NEGATIVE 07/02/2023 1720   LEUKOCYTESUR NEGATIVE 07/02/2023 1720    Radiological Exams on Admission: I have personally reviewed images DG Chest Portable 1 View Result Date: 07/02/2023 CLINICAL DATA:  Shortness of breath with fall at home. Hypoxemia. Recent hospitalization for pneumonia. EXAM: PORTABLE CHEST 1 VIEW COMPARISON:  Radiographs 06/28/2023 and 06/25/2023.  CT 06/25/2023. FINDINGS: 1740 hours. Right IJ Port-A-Cath extends to the level of the mid right atrium. Left subclavian pacemaker leads are grossly unchanged, projecting over the right atrium and right ventricle. Stable cardiomegaly and vascular congestion. No significant change in residual left basilar opacity and probable small left pleural effusion. No progressive airspace disease, edema or pneumothorax demonstrated. The bones appear unchanged. IMPRESSION: No significant change in  residual left basilar opacity and probable small left pleural effusion. No progressive airspace disease or edema. Electronically Signed   By: Carey Bullocks M.D.   On: 07/02/2023 18:17    EKG: I have personally reviewed EKG: a fib/flutter at controlled rate, paced rhythm  Assessment/Plan Principal Problem:   CAP (community acquired pneumonia) Active Problems:   Acute on chronic heart failure with preserved ejection fraction (HFpEF) (HCC)   CAP (community acquired pneumonia) due to influenza A virus   Atrial fibrillation (HCC)   Hypokalemia   Adrenal insufficiency (HCC)   Hypothyroidism   Essential hypertension    Assessment and Plan: CAP (community acquired pneumonia) due to influenza A virus Patient with influenza A with hypercarbic respiratory failure. CXR unchanged from discharge CXR.  Plan  ICU admit  BiPAP for hypercarbia with bleed in oxygen to keep O2 sat > 88%  Procalcitonin - with left shift but normal WBC if positive will add abx coverage  Continue tamiflu  Acute on chronic heart failure with preserved ejection fraction (HFpEF) (HCC) Patient with recent admission with moderate Heart failure - BNP 444. Last Echo May '24  Plan Continue furosemide but change to IV  Echo  Add ARB  Hypothyroidism TSH 6.9, FT4 1.9  Plan Continue present dose of levothyroxine  Adrenal insufficiency (HCC) Patient on florinef at home after last discharge 06/29/23. Now with  acute CAP-influenza A.  Plan Hold florinef  Stress dose hydrocortisone.   Continue midorine  K replacement  Hypokalemia Associated with adrenal insufficiency in the past. NOw with K 2.7  Plan Potassium replacement IV  F/ BMet  Atrial fibrillation (HCC) Patient in stable a fib/flutter with controlled rate  Plan Continue eliquis  Essential hypertension BP soft at admission. He did receive 1 liter IVF with improvement in BP  Plan Continue furosemide   Disposition - return to Rehab when stable.      DVT prophylaxis: Eliquis Code Status: Full Code Family Communication: spoke with son Arav Bannister  Disposition Plan: TBD  Consults called: none  Admission status: Inpatient, Step Down Unit   Illene Regulus, MD [facility] 07/03/2023, 12:14 AM

## 2023-07-04 ENCOUNTER — Inpatient Hospital Stay (HOSPITAL_COMMUNITY): Payer: 59

## 2023-07-04 ENCOUNTER — Other Ambulatory Visit (HOSPITAL_COMMUNITY): Payer: Self-pay | Admitting: *Deleted

## 2023-07-04 DIAGNOSIS — E039 Hypothyroidism, unspecified: Secondary | ICD-10-CM | POA: Diagnosis not present

## 2023-07-04 DIAGNOSIS — E876 Hypokalemia: Secondary | ICD-10-CM | POA: Diagnosis not present

## 2023-07-04 DIAGNOSIS — J9601 Acute respiratory failure with hypoxia: Secondary | ICD-10-CM | POA: Diagnosis not present

## 2023-07-04 DIAGNOSIS — J9602 Acute respiratory failure with hypercapnia: Secondary | ICD-10-CM

## 2023-07-04 DIAGNOSIS — I10 Essential (primary) hypertension: Secondary | ICD-10-CM

## 2023-07-04 DIAGNOSIS — I5031 Acute diastolic (congestive) heart failure: Secondary | ICD-10-CM | POA: Diagnosis not present

## 2023-07-04 DIAGNOSIS — I5033 Acute on chronic diastolic (congestive) heart failure: Secondary | ICD-10-CM

## 2023-07-04 LAB — BASIC METABOLIC PANEL
Anion gap: 11 (ref 5–15)
BUN: 14 mg/dL (ref 8–23)
CO2: 45 mmol/L — ABNORMAL HIGH (ref 22–32)
Calcium: 8.6 mg/dL — ABNORMAL LOW (ref 8.9–10.3)
Chloride: 87 mmol/L — ABNORMAL LOW (ref 98–111)
Creatinine, Ser: 0.67 mg/dL (ref 0.61–1.24)
GFR, Estimated: >60 mL/min (ref >60)
Glucose, Bld: 102 mg/dL — ABNORMAL HIGH (ref 70–99)
Potassium: 2.9 mmol/L — ABNORMAL LOW (ref 3.5–5.1)
Sodium: 143 mmol/L (ref 135–145)

## 2023-07-04 LAB — CBC
HCT: 47.7 % (ref 39.0–52.0)
Hemoglobin: 14.1 g/dL (ref 13.0–17.0)
MCH: 30.1 pg (ref 26.0–34.0)
MCHC: 29.6 g/dL — ABNORMAL LOW (ref 30.0–36.0)
MCV: 101.7 fL — ABNORMAL HIGH (ref 80.0–100.0)
Platelets: 186 10*3/uL (ref 150–400)
RBC: 4.69 MIL/uL (ref 4.22–5.81)
RDW: 15.9 % — ABNORMAL HIGH (ref 11.5–15.5)
WBC: 8.9 10*3/uL (ref 4.0–10.5)
nRBC: 0.2 % (ref 0.0–0.2)

## 2023-07-04 LAB — BLOOD GAS, ARTERIAL
Acid-Base Excess: 24.8 mmol/L — ABNORMAL HIGH (ref 0.0–2.0)
Acid-Base Excess: 31.6 mmol/L — ABNORMAL HIGH (ref 0.0–2.0)
Bicarbonate: 56.5 mmol/L — ABNORMAL HIGH (ref 20.0–28.0)
Bicarbonate: 61.9 mmol/L — ABNORMAL HIGH (ref 20.0–28.0)
Drawn by: 38235
O2 Saturation: 98.3 %
O2 Saturation: 94.6 %
Patient temperature: 37
Patient temperature: 36.6
pCO2 arterial: 100 mm[Hg] (ref 32–48)
pCO2 arterial: 83 mm[Hg] (ref 32–48)
pH, Arterial: 7.36 (ref 7.35–7.45)
pH, Arterial: 7.48 — ABNORMAL HIGH (ref 7.35–7.45)
pO2, Arterial: 88 mm[Hg] (ref 83–108)
pO2, Arterial: 59 mm[Hg] — ABNORMAL LOW (ref 83–108)

## 2023-07-04 LAB — ECHOCARDIOGRAM COMPLETE
AR max vel: 0.91 cm2
AV Area VTI: 0.96 cm2
AV Area mean vel: 0.9 cm2
AV Mean grad: 24 mmHg
AV Peak grad: 38.2 mmHg
Ao pk vel: 3.09 m/s
Area-P 1/2: 3.31 cm2
Height: 72 in
S' Lateral: 4 cm
Weight: 4021.19 [oz_av]

## 2023-07-04 MED ORDER — MAGNESIUM SULFATE IN D5W 1-5 GM/100ML-% IV SOLN
1.0000 g | Freq: Once | INTRAVENOUS | Status: AC
  Administered 2023-07-04: 1 g via INTRAVENOUS
  Filled 2023-07-04: qty 100

## 2023-07-04 MED ORDER — POTASSIUM CHLORIDE CRYS ER 20 MEQ PO TBCR
40.0000 meq | EXTENDED_RELEASE_TABLET | ORAL | Status: DC
  Administered 2023-07-04: 40 meq via ORAL
  Filled 2023-07-04: qty 2

## 2023-07-04 MED ORDER — FUROSEMIDE 10 MG/ML IJ SOLN
40.0000 mg | Freq: Two times a day (BID) | INTRAMUSCULAR | Status: DC
  Administered 2023-07-04 – 2023-07-07 (×8): 40 mg via INTRAVENOUS
  Filled 2023-07-04 (×8): qty 4

## 2023-07-04 MED ORDER — MIDODRINE HCL 5 MG PO TABS
10.0000 mg | ORAL_TABLET | Freq: Three times a day (TID) | ORAL | Status: DC
  Administered 2023-07-04 – 2023-07-10 (×17): 10 mg via ORAL
  Filled 2023-07-04 (×18): qty 2

## 2023-07-04 MED ORDER — POTASSIUM CHLORIDE CRYS ER 20 MEQ PO TBCR
40.0000 meq | EXTENDED_RELEASE_TABLET | ORAL | Status: AC
  Administered 2023-07-04 (×2): 40 meq via ORAL
  Filled 2023-07-04 (×2): qty 2

## 2023-07-04 MED ORDER — PERFLUTREN LIPID MICROSPHERE
1.0000 mL | INTRAVENOUS | Status: AC | PRN
  Administered 2023-07-04: 6 mL via INTRAVENOUS

## 2023-07-04 MED ORDER — HYDROCORTISONE SOD SUC (PF) 100 MG IJ SOLR
100.0000 mg | Freq: Two times a day (BID) | INTRAMUSCULAR | Status: DC
  Administered 2023-07-04 – 2023-07-08 (×9): 100 mg via INTRAVENOUS
  Filled 2023-07-04 (×9): qty 2

## 2023-07-04 NOTE — Progress Notes (Signed)
 Dentures removed and placed in cup.  Increased RR to 22 for pCO2=100 and exuderm applied to bridge of nose for skin breakdown.

## 2023-07-04 NOTE — Progress Notes (Signed)
 ABG results received from lab. Critical value of 100 on the PCO2. pH 7.36, PO2 88, HCO3 56.5, SPO2 98.3%. Patient remains on BIPAP at this time.

## 2023-07-04 NOTE — Progress Notes (Signed)
*  PRELIMINARY RESULTS* Echocardiogram 2D Echocardiogram has been performed with Definity.  Stacey Drain 07/04/2023, 3:14 PM

## 2023-07-04 NOTE — Progress Notes (Signed)
 Post bolus BP is 98/61 (72). On call provider notified.

## 2023-07-04 NOTE — Progress Notes (Signed)
   07/04/23 0800  ReDS Vest / Clip  Station Marker D  Ruler Value 25  ReDS Value Range < 36  ReDS Actual Value 25

## 2023-07-04 NOTE — Plan of Care (Signed)

## 2023-07-04 NOTE — Plan of Care (Signed)

## 2023-07-04 NOTE — Progress Notes (Signed)
 Progress Note   Patient: Roy [name] ZOX:[id number] DOB: Sep 27, 1950 DOA: 07/02/2023     2 DOS: the patient was seen and examined on 07/04/2023   Brief hospital admission narrative: As per H&P written by Dr. Debby Bud on 07/03/2023 Mr. Weide, a 73 y.o. year old male with medical history of atrial fibrillation maintained on Eliquis, adrenal insufficiency, diffuse large B-cell lymphoma, GERD, high cholesterol, hypertension, hospitalization Jan 2025 for posterior spinal fusion with a complicated course and recent admission 2/18-2/22/25 for acute hypoxic respiratory failure because of CHF.Per patient record he was discharged from the hospital 06/29/23 to SNF for rehab.  He has been taking his medication as prescribed. Today he feels weaker than usual.  At rehab He was being walked to the chair by his family, but his legs just gave out.  He was having profound shortness of breath.  He has had shortness of breath with exertion since discharge, unable to walk in his house. EMS activated and on arrival EMT report O2 sats in the 70's. [facility] was transported to [facility] for evaluation. ED Course: T 98.3  SBP in the 70's on arrival but after IVF bolus BP 103/621 HR 60 RR 24, O2 Sat 80's. He was put to high flow oxygen. At admission exam patient was somnolent. Stat VBG revealed hypercarbia with CO2 99. AFter being put to BiPAP patient was much more awake but could not give much hx due to BiPAP mask. Lab: K 2.7 Glucose 162  Alk phso 135 Albumin 3.2, T. Protein 5.8. BNP 255, WBC 9.5 with 90/5/4, Hgb 14.0, INR 1.6, TSH 6.9, FT$ 1.9. Respiratory panel positive for influenza A. CXR no progression in ASD from prior Xray. EKG a. Fib/flutter. TRH called to admit for CAP-influenza A with hypercarbic respiratory failure.   Assessment and Plan: Acute respiratory failure with hypoxia and hypercapnia  -Continue treatment with steroids, bronchodilator management and the use of BiPAP -Wean off oxygen supplementation as tolerated. -There  is component of patient's hypoxemia due to vascular congestion from CHF exacerbation and influenza A infection -Continue treatment with diuresis and Tamiflu -Follow clinical response.  Pneumonia due to influenza A -Continue Tamiflu -Continue steroids and follow clinical response.  Acute on chronic heart failure with preserved ejection fraction (HFpEF) (HCC) -BNP 444 -Follow echo results -Follow daily weights/strict I's and O's -Low-sodium diet discussed with patient -Continue IV diuresis.  Hypothyroidism -Recent TSH 6.9 -Continue Synthroid.  History of adrenal insufficiency/orthostatic hypotension -Continue treatment with Solu-Cortef and adjusted dose of midodrine -Follow vital signs.  Hypothyroidism TSH 6.9, FT4 1.9  Hypokalemia -Associated with adrenal insufficiency and continue use of diuretics -Magnesium stable -Continue electrolyte repletion and follow trend -Continue telemetry monitoring.  History of atrial fibrillation (HCC) -Rate controlled and stable -Continue Eliquis for secondary prevention.  Essential hypertension -Patient with soft blood pressure in the setting of adrenal insufficiency and orthostatic hypotension -Continue treatment with diuresis -Follow vital signs -Midodrine and Solu-Cortef as mentioned above -Holding any other antihypertensive agents at the moment.   Subjective:  Afebrile, overnight demonstrating soft blood pressure and continued need for BiPAP due to ongoing hypercapnia.  Physical Exam: Vitals:   07/04/23 1400 07/04/23 1500 07/04/23 1600 07/04/23 1608  BP: 115/69 107/82 (!) 103/53   Pulse: 61 (!) 58 61   Resp: 15 14 16    Temp:    98.3 F (36.8 C)  TempSrc:    Oral  SpO2: 94% 99% 97%   Weight:      Height:       General  exam: Somnolent but able to communicate with yes or no answers and follow simple commands.  BiPAP in place. Respiratory system: Positive scattered rhonchi; no using accessory muscles.  Good saturation on  current oxygen supplementation.  Decreased breath sounds at the bases. Cardiovascular system:RRR.  No rubs, no gallops, unable to assess JVD with body habitus. Gastrointestinal system: Abdomen is obese, nondistended, soft and nontender. No organomegaly or masses felt. Normal bowel sounds heard. Central nervous system: Moving 4 limbs spontaneously.  No focal neurological deficits. Extremities: No cyanosis or clubbing; trace to 1+ edema appreciated bilaterally. Skin: No petechiae. Psychiatry: Limited examination; flat affect appreciated on exam.  Data Reviewed: ABG: pH 7.36, pCO2 100, pO2 88, bicarb 56.5 and saturation 98.3 Basic metabolic panel: Sodium 143, potassium 2.9, chloride 87, bicarb 45, glucose 102, BUN 14, creatinine 0.67 and GFR >60 CBC: WBCs 8.9, hemoglobin 14.1 and platelet count 186K.  Family Communication: Son updated at bedside.  Disposition: Status is: Inpatient Remains inpatient appropriate because: Continue IV therapy.   Planned Discharge Destination: to be determined.  CRITICAL CARE Performed by: Vassie Loll   Total critical care time: 55 minutes  Critical care time was exclusive of separately billable procedures and treating other patients.  Critical care was necessary to treat or prevent imminent or life-threatening deterioration.  Critical care was time spent personally by me on the following activities: development of treatment plan with patient and/or surrogate as well as nursing, discussions with consultants, evaluation of patient's response to treatment, examination of patient, obtaining history from patient or surrogate, ordering and performing treatments and interventions, ordering and review of laboratory studies, ordering and review of radiographic studies, pulse oximetry and re-evaluation of patient's condition.   Author: Vassie Loll, MD 07/04/2023 6:00 PM  For on call review [email or web address].

## 2023-07-04 NOTE — Progress Notes (Signed)
 Patient hypotensive consistently through shift. On call provider notified order received for bolus. If bolus not effective, will move to levophed as recommended by MD. Will notify MD of status post bolus.

## 2023-07-04 NOTE — Progress Notes (Signed)
 Date and time results received: 07/04/23 1347 (use smartphrase ".now" to insert current time)  Test: Venous Blood Gas  Critical Value: PCO2 83  Name of Provider Notified: Dr Gwenlyn Perking  Orders Received? Or Actions Taken?:  No new order currently

## 2023-07-05 DIAGNOSIS — E876 Hypokalemia: Secondary | ICD-10-CM | POA: Diagnosis not present

## 2023-07-05 DIAGNOSIS — J9601 Acute respiratory failure with hypoxia: Secondary | ICD-10-CM | POA: Diagnosis not present

## 2023-07-05 DIAGNOSIS — I5033 Acute on chronic diastolic (congestive) heart failure: Secondary | ICD-10-CM | POA: Diagnosis not present

## 2023-07-05 DIAGNOSIS — E039 Hypothyroidism, unspecified: Secondary | ICD-10-CM | POA: Diagnosis not present

## 2023-07-05 LAB — BASIC METABOLIC PANEL
BUN: 17 mg/dL (ref 8–23)
CO2: >45 mmol/L — ABNORMAL HIGH (ref 22–32)
Calcium: 8.9 mg/dL (ref 8.9–10.3)
Chloride: 81 mmol/L — ABNORMAL LOW (ref 98–111)
Creatinine, Ser: 0.74 mg/dL (ref 0.61–1.24)
GFR, Estimated: >60 mL/min (ref >60)
Glucose, Bld: 131 mg/dL — ABNORMAL HIGH (ref 70–99)
Potassium: 3 mmol/L — ABNORMAL LOW (ref 3.5–5.1)
Sodium: 141 mmol/L (ref 135–145)

## 2023-07-05 NOTE — Evaluation (Signed)
 Physical Therapy Evaluation Patient Details Name: Roy [name] MRN: [id number] DOB: August 23, 1950 Today's Date: 07/05/2023  History of Present Illness  Roy [name], a 73 y.o. year old male with medical history of atrial fibrillation maintained on Eliquis, adrenal insufficiency, diffuse large B-cell lymphoma, GERD, high cholesterol, hypertension, hospitalization Jan 2025 for posterior spinal fusion with a complicated course and recent admission 2/18-2/22/25 for acute hypoxic respiratory failure because of CHF.Per patient record he was discharged from the hospital 06/29/23 to SNF for rehab.  He has been taking his medication as prescribed. Today he feels weaker than usual.  At rehab He was being walked to the chair by his family, but his legs just gave out.  He was having profound shortness of breath.  He has had shortness of breath with exertion since discharge, unable to walk in his house. EMS activated and on arrival EMT report O2 sats in the 70's. He was transported to [facility] for evaluation.   Clinical Impression  Patient agreeable to PT evaluation. Patient is a 73 y/o male presenting to physical therapy with decreased endurance, general weakness, SOB, and impaired balance. At baseline, patient is independent for ADLs and iADLs but had recent back surgery which he has been in and out of hospital/rehab to address these deficits. Trial of activity on RA on this date. Pt SpO2 level drops to ~83% on RA with bed mobility, requiring 2 LPM t/o remainder of session, SpO2 maintaining appropriate during remainder of session. On this date, patient required CGA-Min A to complete bed mobility, and functional transfers at bedside level. Patient ambulated 14' with RW and CGA assistance. He remains appropriate to be seen by PT acutely and continued skilled physical therapy in skilled nursing facility once discharged in order to address the above to return home safely.           If plan is discharge home, recommend the  following: A little help with walking and/or transfers   Can travel by private vehicle   Yes    Equipment Recommendations None recommended by PT  Recommendations for Other Services       Functional Status Assessment Patient has had a recent decline in their functional status and demonstrates the ability to make significant improvements in function in a reasonable and predictable amount of time.     Precautions / Restrictions Precautions Precautions: Fall Recall of Precautions/Restrictions: Intact Precaution/Restrictions Comments: Hx of spinal fusion 05/2023. Pt reported fall at home within last week. Restrictions Weight Bearing Restrictions Per Provider Order: No      Mobility  Bed Mobility Overal bed mobility: Needs Assistance Bed Mobility: Supine to Sit     Supine to sit: HOB elevated, Used rails, Min assist, Contact guard     General bed mobility comments: Min A-CGA for supine to sit. HOB elevated 2/2 home set up. Min A w/o use of bed railings, CGA with use of bed railings. Transient dizziness reported, recovers after a coouple minutes rest. Bed mobility performed on RA, SpO2 drop to 83%. Pt education on pursed lip breathing. 2 LPM administered. SpO2 returns to 90%+    Transfers Overall transfer level: Needs assistance Equipment used: Rolling walker (2 wheels) Transfers: Sit to/from Stand, Bed to chair/wheelchair/BSC Sit to Stand: Min assist, Mod assist Stand pivot transfers: Contact guard assist         General transfer comment: STS from hospital bed, recliner chair, and commode in room with RW and min/mod A. A level improved t/o session with repetition and pt education/verbal  cueing on proper STS mechanics. On [place] 2 LMP, SpO2 remains above 90%    Ambulation/Gait Ambulation/Gait assistance: Contact guard assist Gait Distance (Feet): 15 Feet Assistive device: Rolling walker (2 wheels) Gait Pattern/deviations: Step-through pattern, Decreased step length - right,  Decreased step length - left, Trunk flexed, Drifts right/left, Narrow base of support Gait velocity: Decreased     General Gait Details: Pt completes gait trial in room with RW, demos unsteadiness t/o requiring CGA. Varying step lengths and widths. No overt LOB occured but pt unsteady t/o entire trial. Decreased velocity. On [place] 2 LMP, SpO2 remains above 90%  Stairs            Wheelchair Mobility     Tilt Bed    Modified Rankin (Stroke Patients Only)       Balance Overall balance assessment: Needs assistance Sitting-balance support: Feet supported, Bilateral upper extremity supported Sitting balance-Leahy Scale: Fair Sitting balance - Comments: Pt able to maintain balance (I) with UE support EOB   Standing balance support: Bilateral upper extremity supported, During functional activity, Reliant on assistive device for balance Standing balance-Leahy Scale: Fair Standing balance comment: Static standing balance fair with support of RW. CGA for safety during dynamic standing balance, ie. gait.                             Pertinent Vitals/Pain Pain Assessment Pain Assessment: No/denies pain    Home Living Family/patient expects to be discharged to:: Private residence Living Arrangements: Alone Available Help at Discharge: Neighbor;Available PRN/intermittently Type of Home: House Home Access: Stairs to enter Entrance Stairs-Rails: Left;Right;Can reach both Entrance Stairs-Number of Steps: 3   Home Layout: One level Home Equipment: Agricultural consultant (2 wheels);Cane - single point;BSC/3in1 Additional Comments: No apparent immediate help available. Reports neighbor may be able to help some.    Prior Function Prior Level of Function : Independent/Modified Independent             Mobility Comments: RW at baseline. SPC available.       Extremity/Trunk Assessment   Upper Extremity Assessment Upper Extremity Assessment: Defer to OT evaluation    Lower  Extremity Assessment Lower Extremity Assessment: Overall WFL for tasks assessed;Generalized weakness    Cervical / Trunk Assessment Cervical / Trunk Assessment: Kyphotic;Back Surgery  Communication   Communication Communication: No apparent difficulties    Cognition Arousal: Alert Behavior During Therapy: WFL for tasks assessed/performed   PT - Cognitive impairments: No apparent impairments                         Following commands: Intact       Cueing Cueing Techniques: Verbal cues     General Comments      Exercises     Assessment/Plan    PT Assessment Patient needs continued PT services  PT Problem List Decreased strength;Decreased activity tolerance;Decreased balance;Decreased mobility       PT Treatment Interventions Gait training;DME instruction;Stair training;Functional mobility training;Therapeutic activities;Therapeutic exercise;Balance training;Patient/family education    PT Goals (Current goals can be found in the Care Plan section)  Acute Rehab PT Goals Patient Stated Goal: Return home safely with help from family/friends PT Goal Formulation: With patient Time For Goal Achievement: 07/12/23 Potential to Achieve Goals: Good    Frequency Min 3X/week     Co-evaluation               AM-PAC PT "6 Clicks" Mobility  Outcome Measure Help needed turning from your back to your side while in a flat bed without using bedrails?: A Little Help needed moving from lying on your back to sitting on the side of a flat bed without using bedrails?: A Lot Help needed moving to and from a bed to a chair (including a wheelchair)?: A Little Help needed standing up from a chair using your arms (e.g., wheelchair or bedside chair)?: A Little Help needed to walk in hospital room?: A Little Help needed climbing 3-5 steps with a railing? : A Little 6 Click Score: 17    End of Session   Activity Tolerance: Patient tolerated treatment well;No increased  pain Patient left: in chair;with call bell/phone within reach Nurse Communication: Mobility status PT Visit Diagnosis: Unsteadiness on feet (R26.81);Other abnormalities of gait and mobility (R26.89);Muscle weakness (generalized) (M62.81);History of falling (Z91.81);Difficulty in walking, not elsewhere classified (R26.2)    Time: 1610-9604 PT Time Calculation (min) (ACUTE ONLY): 30 min   Charges:   PT Evaluation $PT Eval Moderate Complexity: 1 Mod PT Treatments $Gait Training: 23-37 mins $Therapeutic Activity: 23-37 mins PT General Charges $$ ACUTE PT VISIT: 1 Visit        3:34 PM, 07/05/23 Chryl Heck, PT, DPT [place] with [facility]

## 2023-07-05 NOTE — TOC Progression Note (Signed)
 Transition of Care [facility]) - Progression Note    Patient Details  Name: Roy [name] MRN: [id number] Date of Birth: 05-21-1950  [name]) CM/SW Contact  Elliot Gault, LCSW Phone Number: 07/05/2023, 1:07 PM  Clinical Narrative:     TOC following. MD requesting home NIV for pt at dc. MD anticipating dc in two days.   Referral sent to [facility] at [facility]. Per Marylene Land, she will start insurance auth and she anticipates being able to provide the NIV to pt at dc. Updated MD and RN on requested documentation needed for the auth.  TOC will follow.  Expected Discharge Plan: Home w Home Health Services Barriers to Discharge: Continued Medical Work up  Expected Discharge Plan and Services In-house Referral: Clinical Social Work   Post Acute Care Choice: Durable Medical Equipment Living arrangements for the past 2 months: Single Family Home                 DME Arranged: NIV DME Agency: Other - Comment Retia Passe) Date DME Agency Contacted: 07/05/23   Representative spoke with at DME Agency: Marylene Land HH Arranged: PT, OT [facility] Agency: [facility] Date [facility] Agency Contacted: 07/03/23 Time HH Agency Contacted: [phone] Representative spoke with at [facility] Agency: Victorino Dike   Social Determinants of Health (SDOH) Interventions SDOH Screenings   Food Insecurity: No Food Insecurity (07/03/2023)  Housing: Low Risk  (07/03/2023)  Transportation Needs: No Transportation Needs (07/03/2023)  Utilities: Not At Risk (07/03/2023)  Alcohol Screen: Low Risk  (11/08/2020)  Depression (PHQ2-9): Low Risk  (11/08/2020)  Financial Resource Strain: Low Risk  (05/15/2023)   Received from [facility]  Physical Activity: Unknown (04/25/2023)   Received from [facility]  Recent Concern: Physical Activity - Inactive (04/25/2023)   Received from [facility]  Social Connections: Moderately Isolated (07/03/2023)  Stress: No Stress Concern Present (04/25/2023)   Received from [facility]  Tobacco Use: Low  Risk  (07/02/2023)    Readmission Risk Interventions    07/03/2023    8:45 AM 06/28/2023    7:56 AM 07/17/2021   12:49 PM  Readmission Risk Prevention Plan  Transportation Screening Complete Complete Complete  HRI or Home Care Consult Complete Complete Complete  Social Work Consult for Recovery Care Planning/Counseling Complete Complete Complete  Palliative Care Screening Not Applicable Not Applicable Not Applicable  Medication Review Oceanographer) Complete Complete Complete

## 2023-07-05 NOTE — Progress Notes (Signed)
   07/05/23 0800  ReDS Vest / Clip  Station Marker D  Ruler Value 43  ReDS Value Range < 36  ReDS Actual Value 23

## 2023-07-05 NOTE — Plan of Care (Signed)
  Problem: Acute Rehab PT Goals(only PT should resolve) Goal: Pt Will Go Supine/Side To Sit Outcome: Progressing Flowsheets (Taken 07/05/2023 1536) Pt will go Supine/Side to Sit: with modified independence Goal: Patient Will Transfer Sit To/From Stand Outcome: Progressing Flowsheets (Taken 07/05/2023 1536) Patient will transfer sit to/from stand: with modified independence Goal: Pt Will Transfer Bed To Chair/Chair To Bed Outcome: Progressing Flowsheets (Taken 07/05/2023 1536) Pt will Transfer Bed to Chair/Chair to Bed: with modified independence Goal: Pt Will Ambulate Outcome: Progressing Flowsheets (Taken 07/05/2023 1536) Pt will Ambulate:  50 feet  with modified independence  with rolling walker   3:39 PM, 07/05/23 Chryl Heck, PT, DPT [place] with [facility]

## 2023-07-05 NOTE — TOC Progression Note (Signed)
 Transition of Care [facility]) - Progression Note    Patient Details  Name: Roy [name] MRN: [id number] Date of Birth: 06/18/1950  [facility]) CM/SW Contact  Elliot Gault, LCSW Phone Number: 07/05/2023, 3:12 PM  Clinical Narrative:     PT now recommending SNF rehab at dc. Spoke with pt at bedside. Pt son on speaker phone at pt request. Reviewed PT recommendation. Pt and son agreeable to SNF rehab referrals. CMS provider options reviewed. Will refer as requested and start auth.  Updated Md. Will follow.  Expected Discharge Plan: Skilled Nursing Facility Barriers to Discharge: Continued Medical Work up  Expected Discharge Plan and Services In-house Referral: Clinical Social Work   Post Acute Care Choice: Skilled Nursing Facility Living arrangements for the past 2 months: Single Family Home                 DME Arranged: NIV DME Agency: Other - Comment Retia Passe) Date DME Agency Contacted: 07/05/23   Representative spoke with at DME Agency: Marylene Land HH Arranged: PT, OT HH Agency: [facility] Date [facility] Agency Contacted: 07/03/23 Time HH Agency Contacted: [phone] Representative spoke with at [facility] Agency: Victorino Dike   Social Determinants of Health (SDOH) Interventions SDOH Screenings   Food Insecurity: No Food Insecurity (07/03/2023)  Housing: Low Risk  (07/03/2023)  Transportation Needs: No Transportation Needs (07/03/2023)  Utilities: Not At Risk (07/03/2023)  Alcohol Screen: Low Risk  (11/08/2020)  Depression (PHQ2-9): Low Risk  (11/08/2020)  Financial Resource Strain: Low Risk  (05/15/2023)   Received from [facility]  Physical Activity: Unknown (04/25/2023)   Received from [facility]  Recent Concern: Physical Activity - Inactive (04/25/2023)   Received from [facility]  Social Connections: Moderately Isolated (07/03/2023)  Stress: No Stress Concern Present (04/25/2023)   Received from [facility]  Tobacco Use: Low Risk  (07/02/2023)    Readmission Risk  Interventions    07/03/2023    8:45 AM 06/28/2023    7:56 AM 07/17/2021   12:49 PM  Readmission Risk Prevention Plan  Transportation Screening Complete Complete Complete  HRI or Home Care Consult Complete Complete Complete  Social Work Consult for Recovery Care Planning/Counseling Complete Complete Complete  Palliative Care Screening Not Applicable Not Applicable Not Applicable  Medication Review Oceanographer) Complete Complete Complete

## 2023-07-05 NOTE — Progress Notes (Signed)
 Progress Note   Patient: Roy [name] ZOX:[id number] DOB: [birth year] DOA: 07/02/2023     3 DOS: the patient was seen and examined on 07/05/2023   Brief hospital admission narrative: As per H&P written by Dr. Debby Bud on 07/03/2023 Roy [name], a 73 y.o. year old male with medical history of atrial fibrillation maintained on Eliquis, adrenal insufficiency, diffuse large B-cell lymphoma, GERD, high cholesterol, hypertension, hospitalization Jan 2025 for posterior spinal fusion with a complicated course and recent admission 2/18-2/22/25 for acute hypoxic respiratory failure because of CHF.Per patient record he was discharged from the hospital 06/29/23 to SNF for rehab.  He has been taking his medication as prescribed. Today he feels weaker than usual.  At rehab He was being walked to the chair by his family, but his legs just gave out.  He was having profound shortness of breath.  He has had shortness of breath with exertion since discharge, unable to walk in his house. EMS activated and on arrival EMT report O2 sats in the 70's. He was transported to [facility] for evaluation. ED Course: T 98.3  SBP in the 70's on arrival but after IVF bolus BP 103/621 HR 60 RR 24, O2 Sat 80's. He was put to high flow oxygen. At admission exam patient was somnolent. Stat VBG revealed hypercarbia with CO2 99. AFter being put to BiPAP patient was much more awake but could not give much hx due to BiPAP mask. Lab: K 2.7 Glucose 162  Alk phso 135 Albumin 3.2, T. Protein 5.8. BNP 255, WBC 9.5 with 90/5/4, Hgb 14.0, INR 1.6, TSH 6.9, FT$ 1.9. Respiratory panel positive for influenza A. CXR no progression in ASD from prior Xray. EKG a. Fib/flutter. TRH called to admit for CAP-influenza A with hypercarbic respiratory failure.   Assessment and Plan: Acute respiratory failure with hypoxia and hypercapnia  -Continue treatment with steroids, bronchodilator management and the use of BiPAP -Wean off oxygen supplementation as tolerated. -There  is component of patient's hypoxemia due to vascular congestion from CHF exacerbation and influenza A infection -Continue treatment with diuresis and Tamiflu -Follow clinical response. -Ordering AVAPS-AE for acute respiratory failure with hypercapnia and OHS.  The addition of the auto titrating feature is required for this patient to decrease the PaCO2 more efficiently and rapidly due to the severity of his condition.  Patient continues with persistent critical illness with BiPAP use as prescribed during this episode of care.  Volume ventilation is indicated.  Ordering NIV with AVAPS-AE for use during hours of sleep as well during waking hours when symptomatic.  Home BiPAP/home BiPAP with AVAPS is not appropriate for meeting this patient's ventilatory requirement due to the recent below: BiPAP-AVAPS flow capability is extremely limited (one third of what they may have come the chief).  May have with AVAPS-AE is preferred due to our adjusting EPAP function following for continue upper airway patency throughout the use.  May have with AVAPS AE has dual prescription modality allowing for seamless transition between modes when changes in the patient's clinical condition requires.  Pneumonia due to influenza A -Continue Tamiflu and supportive care. -Continue steroids and follow clinical response.  Acute on chronic heart failure with preserved ejection fraction (HFpEF) (HCC) -BNP 444 -Follow echo results -Follow daily weights/strict I's and O's -Low-sodium diet discussed with patient -Continue IV diuresis.  Hypothyroidism -Recent TSH 6.9 -Continue Synthroid.  History of adrenal insufficiency/orthostatic hypotension -Continue treatment with Solu-Cortef and adjusted dose of midodrine -Continue to follow vital signs. -Blood pressure much better.  Hypokalemia -Associated with adrenal insufficiency and continue use of diuretics -Magnesium stable -Continue electrolyte repletion and follow  trend -Continue telemetry monitoring.  History of atrial fibrillation (HCC) -Rate controlled and stable;  -Continue Eliquis for secondary prevention.  Essential hypertension -Patient with soft blood pressure in the setting of adrenal insufficiency and orthostatic hypotension -Continue treatment with diuresis -Follow vital signs -Continue midodrine and Solu-Cortef as mentioned above -Continue holding any other antihypertensive agents at the moment.  Physical deconditioning -Patient has been seen by physical therapy with recommendation for skilled nursing facility at discharge. -Patient and family in agreement. -TOC aware and helping with discharge plans.   Subjective:  More interactive, no chest pain, no nausea, no vomiting.  At time of examination 2 L nasal cannula supplementation in place.  Reports breathing is better.  Physical Exam: Vitals:   07/05/23 1500 07/05/23 1530 07/05/23 1700 07/05/23 1748  BP: 106/75  (!) 129/59   Pulse: 68  61   Resp: 17  19   Temp:    97.7 F (36.5 C)  TempSrc:    Oral  SpO2: 95% 100% 95%   Weight:      Height:       General exam: Alert, awake, more interactive and reporting no chest pain, no nausea, no vomiting. Respiratory system: Improved air movement bilaterally; positive rhonchi at and fine crackles at the bases. Cardiovascular system: Rate controlled, no rubs, no gallops, unable to assess JVD with body habitus. Gastrointestinal system: Abdomen is obese, nondistended, soft and nontender. No organomegaly or masses felt. Normal bowel sounds heard. Central nervous system: Moving 4 limbs spontaneously.  No focal neurological deficits. Extremities: No cyanosis or clubbing; trace edema bilaterally. Skin: No petechiae. Psychiatry: Flat affect appreciated on exam.  Latest data Reviewed: ABG: pH 7.36, pCO2 100, pO2 88, bicarb 56.5 and saturation 98.3 Basic metabolic panel: Sodium 143, potassium 2.9, chloride 87, bicarb 45, glucose 102, BUN 14,  creatinine 0.67 and GFR >60 CBC: WBCs 8.9, hemoglobin 14.1 and platelet count 186K.  Family Communication: Son updated at bedside.  Disposition: Status is: Inpatient Remains inpatient appropriate because: Continue IV therapy.   Planned Discharge Destination: to be determined. CRITICAL CARE Performed by: Vassie Loll   Total critical care time: 55 minutes  Critical care time was exclusive of separately billable procedures and treating other patients.  Critical care was necessary to treat or prevent imminent or life-threatening deterioration.  Critical care was time spent personally by me on the following activities: development of treatment plan with patient and/or surrogate as well as nursing, discussions with consultants, evaluation of patient's response to treatment, examination of patient, obtaining history from patient or surrogate, ordering and performing treatments and interventions, ordering and review of laboratory studies, ordering and review of radiographic studies, pulse oximetry and re-evaluation of patient's condition.   Author: Vassie Loll, MD 07/05/2023 6:28 PM  For on call review [email or web address].

## 2023-07-05 NOTE — Progress Notes (Signed)
 Patient able to perform -40 NIF.

## 2023-07-05 NOTE — [place] FL2 (Signed)
 [place] MEDICAID FL2 LEVEL OF CARE FORM     IDENTIFICATION  Patient Name: Roy [name] Birthdate: 19-Dec-1950 Sex: male Admission Date (Current Location): 07/02/2023  [facility] and [place] Number:  [employer] and Address:  [facility],  [address], [place] [address]      Provider Number: [id number]  Attending Physician Name and Address:  Vassie Loll, MD  Relative Name and Phone Number:       Current Level of Care: Hospital Recommended Level of Care: Skilled Nursing Facility Prior Approval Number:    Date Approved/Denied:   PASRR Number: [id number]  Discharge Plan: SNF    Current Diagnoses: Patient Active Problem List   Diagnosis Date Noted   CAP (community acquired pneumonia) due to influenza A virus 07/02/2023   CAP (community acquired pneumonia) 07/02/2023   Acute hypoxic respiratory failure (HCC) 06/25/2023   Acute on chronic heart failure with preserved ejection fraction (HFpEF) (HCC) 06/25/2023   GI bleed 06/25/2023   Benign hypertension with coincident congestive heart failure (HCC) 06/10/2023   Chronic constipation 06/10/2023   Vitamin B 12 deficiency 06/10/2023   Hypomagnesuria 06/10/2023   Peripheral neuropathy 06/10/2023   Aortic atherosclerosis (HCC) 06/10/2023   T11 vertebral fracture (HCC) 05/24/2023   Urge incontinence 11/05/2022   Symptomatic bradycardia 09/05/2022   Acute congestive heart failure (HCC) 09/05/2022   Parotid neoplasm 12/28/2021   Mononeuropathy of left lower limb 12/28/2021   Neutropenic fever (HCC) 07/14/2021   Hypothyroidism 07/14/2021   Hypotension 07/14/2021   Hyperlipidemia 07/14/2021   BPH (benign prostatic hyperplasia) 07/14/2021   GERD (gastroesophageal reflux disease) 07/14/2021   Leukopenia    Thrombocytopenia (HCC)    Large B-cell lymphoma (HCC) 03/22/2021   Varicose veins of bilateral lower extremities with pain 02/02/2021   Venous (peripheral) insufficiency 02/02/2021   Subacute  thyroiditis 01/20/2021   Adrenal insufficiency (HCC) 01/10/2021   Hyponatremia 01/08/2021   Weakness 01/08/2021   Nonrheumatic aortic valve stenosis 12/20/2020   Hyperthyroidism 10/22/2020   Coagulopathy (HCC) 10/21/2020   Obesity (BMI 30-39.9) 05/04/2015   Chronic anticoagulation 05/04/2015   Trifascicular block 04/29/2015   LAFB (left anterior fascicular block) 04/29/2015   Essential hypertension    Hypokalemia 04/28/2015   RBBB 04/28/2015   First degree AV block 04/28/2015   Atrial fibrillation (HCC) 04/27/2015    Orientation RESPIRATION BLADDER Height & Weight     Self, Time, Situation, Place  O2 (see dc summary) Continent Weight: 251 lb 12.3 oz (114.2 kg) Height:  6' (182.9 cm)  BEHAVIORAL SYMPTOMS/MOOD NEUROLOGICAL BOWEL NUTRITION STATUS      Continent Diet (see dc summary)  AMBULATORY STATUS COMMUNICATION OF NEEDS Skin   Extensive Assist Verbally Normal                       Personal Care Assistance Level of Assistance  Bathing, Feeding, Dressing Bathing Assistance: Limited assistance Feeding assistance: Independent Dressing Assistance: Limited assistance     Functional Limitations Info  Sight, Hearing, Speech Sight Info: Adequate Hearing Info: Adequate Speech Info: Adequate    SPECIAL CARE FACTORS FREQUENCY  PT (By licensed PT), OT (By licensed OT)     PT Frequency: 5x week OT Frequency: 5x week            Contractures Contractures Info: Not present    Additional Factors Info  Code Status, Allergies Code Status Info: Full Allergies Info: Sulfa Antibiotics, Adhesive tape           Current  Medications (07/05/2023):  This is the current hospital active medication list Current Facility-Administered Medications  Medication Dose Route Frequency Provider Last Rate Last Admin   acetaminophen (TYLENOL) tablet 650 mg  650 mg Oral Q6H PRN Norins, Rosalyn Gess, MD       alfuzosin (UROXATRAL) 24 hr tablet 10 mg  10 mg Oral Q breakfast Norins, Rosalyn Gess,  MD   10 mg at 07/05/23 0810   apixaban (ELIQUIS) tablet 5 mg  5 mg Oral BID Norins, Rosalyn Gess, MD   5 mg at 07/05/23 0981   Chlorhexidine Gluconate Cloth 2 % PADS 6 each  6 each Topical Q0600 Vassie Loll, MD   6 each at 07/05/23 0615   ferrous sulfate tablet 325 mg  325 mg Oral Q breakfast Jacques Navy, MD   325 mg at 07/05/23 0810   furosemide (LASIX) injection 40 mg  40 mg Intravenous BID Vassie Loll, MD   40 mg at 07/05/23 1914   hydrocortisone sodium succinate (SOLU-CORTEF) 100 MG injection 100 mg  100 mg Intravenous Lawerance Cruel, MD   100 mg at 07/05/23 7829   levothyroxine (SYNTHROID) tablet 100 mcg  100 mcg Oral QAC breakfast Jacques Navy, MD   100 mcg at 07/05/23 0615   methocarbamol (ROBAXIN) tablet 500 mg  500 mg Oral Q6H PRN Jacques Navy, MD   500 mg at 07/04/23 2151   midodrine (PROAMATINE) tablet 10 mg  10 mg Oral TID Vassie Loll, MD   10 mg at 07/05/23 5621   oseltamivir (TAMIFLU) capsule 75 mg  75 mg Oral BID Vassie Loll, MD   75 mg at 07/05/23 0810   pantoprazole (PROTONIX) EC tablet 40 mg  40 mg Oral Daily Norins, Rosalyn Gess, MD   40 mg at 07/05/23 0810   polyethylene glycol (MIRALAX / GLYCOLAX) packet 17 g  17 g Oral Daily Jacques Navy, MD   17 g at 07/05/23 3086   pregabalin (LYRICA) capsule 200 mg  200 mg Oral BID Jacques Navy, MD   200 mg at 07/05/23 0810   rosuvastatin (CRESTOR) tablet 5 mg  5 mg Oral QHS Norins, Rosalyn Gess, MD   5 mg at 07/04/23 2152   senna-docusate (Senokot-S) tablet 2 tablet  2 tablet Oral QHS Jacques Navy, MD   2 tablet at 07/04/23 2152   valACYclovir (VALTREX) tablet 1,000 mg  1,000 mg Oral Daily Jacques Navy, MD   1,000 mg at 07/05/23 5784     Discharge Medications: Please see discharge summary for a list of discharge medications.  Relevant Imaging Results:  Relevant Lab Results:   Additional Information SSN: [id number], [place]

## 2023-07-06 DIAGNOSIS — J101 Influenza due to other identified influenza virus with other respiratory manifestations: Secondary | ICD-10-CM | POA: Diagnosis not present

## 2023-07-06 DIAGNOSIS — E039 Hypothyroidism, unspecified: Secondary | ICD-10-CM | POA: Diagnosis not present

## 2023-07-06 DIAGNOSIS — J9601 Acute respiratory failure with hypoxia: Secondary | ICD-10-CM | POA: Diagnosis not present

## 2023-07-06 DIAGNOSIS — I10 Essential (primary) hypertension: Secondary | ICD-10-CM | POA: Diagnosis not present

## 2023-07-06 LAB — BASIC METABOLIC PANEL
BUN: 21 mg/dL (ref 8–23)
CO2: >45 mmol/L — ABNORMAL HIGH (ref 22–32)
Calcium: 8.9 mg/dL (ref 8.9–10.3)
Chloride: 79 mmol/L — ABNORMAL LOW (ref 98–111)
Creatinine, Ser: 0.58 mg/dL — ABNORMAL LOW (ref 0.61–1.24)
GFR, Estimated: >60 mL/min (ref >60)
Glucose, Bld: 116 mg/dL — ABNORMAL HIGH (ref 70–99)
Potassium: 2.4 mmol/L — CL (ref 3.5–5.1)
Sodium: 139 mmol/L (ref 135–145)

## 2023-07-06 LAB — BLOOD GAS, ARTERIAL
Acid-Base Excess: 34.2 mmol/L — ABNORMAL HIGH (ref 0.0–2.0)
Bicarbonate: 62.7 mmol/L — ABNORMAL HIGH (ref 20.0–28.0)
Drawn by: 10555
FIO2: 28 %
O2 Saturation: 98.2 %
Patient temperature: 37
pCO2 arterial: 70 mmHg (ref 32–48)
pH, Arterial: 7.56 — ABNORMAL HIGH (ref 7.35–7.45)
pO2, Arterial: 76 mmHg — ABNORMAL LOW (ref 83–108)

## 2023-07-06 MED ORDER — POTASSIUM CHLORIDE CRYS ER 20 MEQ PO TBCR
40.0000 meq | EXTENDED_RELEASE_TABLET | ORAL | Status: AC
Start: 2023-07-06 — End: 2023-07-06
  Administered 2023-07-06 (×3): 40 meq via ORAL
  Filled 2023-07-06 (×3): qty 2

## 2023-07-06 MED ORDER — POTASSIUM CHLORIDE CRYS ER 20 MEQ PO TBCR
40.0000 meq | EXTENDED_RELEASE_TABLET | Freq: Once | ORAL | Status: AC
  Administered 2023-07-06: 40 meq via ORAL
  Filled 2023-07-06: qty 2

## 2023-07-06 NOTE — Plan of Care (Signed)
  Problem: Education: Goal: Knowledge of General Education information will improve Description: Including pain rating scale, medication(s)/side effects and non-pharmacologic comfort measures Outcome: Not Progressing   Problem: Health Behavior/Discharge Planning: Goal: Ability to manage health-related needs will improve Outcome: Progressing   Problem: Clinical Measurements: Goal: Ability to maintain clinical measurements within normal limits will improve Outcome: Progressing Goal: Will remain free from infection Outcome: Progressing Goal: Diagnostic test results will improve Outcome: Progressing Goal: Respiratory complications will improve Outcome: Progressing Goal: Cardiovascular complication will be avoided Outcome: Progressing   Problem: Activity: Goal: Risk for activity intolerance will decrease Outcome: Not Progressing   Problem: Nutrition: Goal: Adequate nutrition will be maintained Outcome: Progressing   Problem: Coping: Goal: Level of anxiety will decrease Outcome: Progressing   Problem: Elimination: Goal: Will not experience complications related to bowel motility Outcome: Progressing Goal: Will not experience complications related to urinary retention Outcome: Progressing   Problem: Pain Managment: Goal: General experience of comfort will improve and/or be controlled Outcome: Progressing   Problem: Safety: Goal: Ability to remain free from injury will improve Outcome: Progressing   Problem: Skin Integrity: Goal: Risk for impaired skin integrity will decrease Outcome: Progressing   Problem: Clinical Measurements: Goal: Ability to maintain a body temperature in the normal range will improve Outcome: Progressing   Problem: Activity: Goal: Ability to tolerate increased activity will improve Outcome: Not Progressing   Problem: Respiratory: Goal: Ability to maintain adequate ventilation will improve Outcome: Progressing Goal: Ability to maintain a  clear airway will improve Outcome: Progressing

## 2023-07-06 NOTE — Progress Notes (Signed)
 Progress Note   Patient: Roy [name] YNW:[id number] DOB: 1950-08-05 DOA: 07/02/2023     4 DOS: the patient was seen and examined on 07/06/2023   Brief hospital admission narrative: As per H&P written by Dr. Debby Bud on 07/03/2023 Roy [name], a 73 y.o. year old male with medical history of atrial fibrillation maintained on Eliquis, adrenal insufficiency, diffuse large B-cell lymphoma, GERD, high cholesterol, hypertension, hospitalization Jan 2025 for posterior spinal fusion with a complicated course and recent admission 2/18-2/22/25 for acute hypoxic respiratory failure because of CHF.Per patient record he was discharged from the hospital 06/29/23 to SNF for rehab.  He has been taking his medication as prescribed. Today he feels weaker than usual.  At rehab He was being walked to the chair by his family, but his legs just gave out.  He was having profound shortness of breath.  He has had shortness of breath with exertion since discharge, unable to walk in his house. EMS activated and on arrival EMT report O2 sats in the 70's. He was transported to [facility] for evaluation. ED Course: T 98.3  SBP in the 70's on arrival but after IVF bolus BP 103/621 HR 60 RR 24, O2 Sat 80's. He was put to high flow oxygen. At admission exam patient was somnolent. Stat VBG revealed hypercarbia with CO2 99. AFter being put to BiPAP patient was much more awake but could not give much hx due to BiPAP mask. Lab: K 2.7 Glucose 162  Alk phso 135 Albumin 3.2, T. Protein 5.8. BNP 255, WBC 9.5 with 90/5/4, Hgb 14.0, INR 1.6, TSH 6.9, FT$ 1.9. Respiratory panel positive for influenza A. CXR no progression in ASD from prior Xray. EKG a. Fib/flutter. TRH called to admit for CAP-influenza A with hypercarbic respiratory failure.   Assessment and Plan: Acute respiratory failure with hypoxia and hypercapnia  -Continue treatment with steroids, bronchodilator management and the use of BiPAP equivalent. -Wean off oxygen supplementation as  tolerated. -There is component of patient's hypoxemia due to vascular congestion from CHF exacerbation and influenza A infection -Continue treatment with diuresis and Tamiflu -Follow clinical response. -Ordering AVAPS-AE for acute respiratory failure with hypercapnia and OHS.  The addition of the auto titrating feature is required for this patient to decrease the PaCO2 more efficiently and rapidly due to the severity of his condition.  Patient continues with persistent critical illness with BiPAP use as prescribed during this episode of care.  Volume ventilation is indicated.  Ordering NIV with AVAPS-AE for use during hours of sleep as well during waking hours when symptomatic.  Home BiPAP/home BiPAP with AVAPS is not appropriate for meeting this patient's ventilatory requirement due to the recent below: BiPAP-AVAPS flow capability is extremely limited (one third of what they may have come the chief).  May have with AVAPS-AE is preferred due to our adjusting EPAP function following for continue upper airway patency throughout the use.  May have with AVAPS AE has dual prescription modality allowing for seamless transition between modes when changes in the patient's clinical condition requires. -CO2 down to the 70s and patient mentation significantly improved.  Pneumonia due to influenza A -Continue Tamiflu and supportive care. -Continue steroids and follow clinical response.  Acute on chronic heart failure with preserved ejection fraction (HFpEF) (HCC) -BNP 444 -Follow echo results -Follow daily weights/strict I's and O's -Low-sodium diet discussed with patient -Continue IV diuresis.  Hypothyroidism -Recent TSH 6.9 -Continue Synthroid.  History of adrenal insufficiency/orthostatic hypotension -Continue treatment with Solu-Cortef and adjusted dose of  midodrine -Continue to follow vital signs. -Blood pressure much better.  Hypokalemia -Associated with adrenal insufficiency and continue use  of diuretics -Magnesium stable -Continue electrolyte repletion and follow trend -Continue telemetry monitoring.  History of atrial fibrillation (HCC) -Rate controlled and stable;  -Continue Eliquis for secondary prevention.  Essential hypertension -Patient with soft blood pressure in the setting of adrenal insufficiency and orthostatic hypotension -Continue treatment with diuresis -Follow vital signs -Continue midodrine and Solu-Cortef as mentioned above -Continue holding any other antihypertensive agents at the moment.  Physical deconditioning -Patient has been seen by physical therapy with recommendation for skilled nursing facility at discharge. -Patient and family in agreement. -TOC aware and helping with discharge plans.   Subjective:  No chest pain, no nausea, no vomiting.  Reports feeling better.  Good saturation on 2 L supplementation.  After another night with compliance using BiPAP equivalent equipment CO2 down to the 70s.  Physical Exam: Vitals:   07/06/23 1400 07/06/23 1500 07/06/23 1600 07/06/23 1609  BP: 109/69 137/80 (!) 145/84   Pulse: 65 61 60   Resp: 16 17 18    Temp:    98.1 F (36.7 C)  TempSrc:    Oral  SpO2: 97% 97% 97%   Weight:      Height:       General exam: Alert, awake, oriented x 3; following commands appropriately and in no acute distress. Respiratory system: Improved air movement bilaterally; decreased breath sounds at the bases without frank crackles.  No using accessory muscle.  Good saturation on 2 L. Cardiovascular system: Rate controlled, no rubs, no gallops, but able to see JVD with body habitus. Gastrointestinal system: Abdomen is obese, nondistended, soft and nontender. No organomegaly or masses felt. Normal bowel sounds heard. Central nervous system: Moving 4 limbs spontaneously.  No focal neurological deficits. Extremities: No cyanosis or clubbing. Skin: No petechiae. Psychiatry: Flat affect appreciated on exam.  Latest data  Reviewed: ABG: pH 7.36, pCO2 100, pO2 88, bicarb 56.5 and saturation 98.3 Basic metabolic panel: Sodium 143, potassium 2.9, chloride 87, bicarb 45, glucose 102, BUN 14, creatinine 0.67 and GFR >60 CBC: WBCs 8.9, hemoglobin 14.1 and platelet count 186K.  Family Communication: Son updated at bedside.  Disposition: Status is: Inpatient Remains inpatient appropriate because: Continue IV therapy.  Author: Vassie Loll, MD 07/06/2023 6:11 PM  For on call review [email or web address].

## 2023-07-06 NOTE — Progress Notes (Signed)
 Critical Results Documentation   07/06/23 [phone]  Provider Notification  Provider Name/Title Dr. Thomes Dinning, MD  Date Provider Notified 07/06/23  Time Provider Notified [phone]  Method of Notification Page  Notification Reason Critical Result  Test performed and critical result PCO2 70  Date Critical Result Received 07/06/23  Time Critical Result Received 0652  Provider response No new orders

## 2023-07-07 DIAGNOSIS — E039 Hypothyroidism, unspecified: Secondary | ICD-10-CM | POA: Diagnosis not present

## 2023-07-07 DIAGNOSIS — I10 Essential (primary) hypertension: Secondary | ICD-10-CM | POA: Diagnosis not present

## 2023-07-07 DIAGNOSIS — J9601 Acute respiratory failure with hypoxia: Secondary | ICD-10-CM | POA: Diagnosis not present

## 2023-07-07 DIAGNOSIS — J101 Influenza due to other identified influenza virus with other respiratory manifestations: Secondary | ICD-10-CM | POA: Diagnosis not present

## 2023-07-07 LAB — BASIC METABOLIC PANEL
BUN: 24 mg/dL — ABNORMAL HIGH (ref 8–23)
CO2: >45 mmol/L — ABNORMAL HIGH (ref 22–32)
Calcium: 8.7 mg/dL — ABNORMAL LOW (ref 8.9–10.3)
Chloride: 74 mmol/L — ABNORMAL LOW (ref 98–111)
Creatinine, Ser: 0.72 mg/dL (ref 0.61–1.24)
GFR, Estimated: >60 mL/min (ref >60)
Glucose, Bld: 185 mg/dL — ABNORMAL HIGH (ref 70–99)
Potassium: 1.7 mmol/L — CL (ref 3.5–5.1)
Sodium: 137 mmol/L (ref 135–145)

## 2023-07-07 LAB — CULTURE, BLOOD (ROUTINE X 2)
Culture: NO GROWTH
Culture: NO GROWTH
Special Requests: ADEQUATE
Special Requests: ADEQUATE

## 2023-07-07 MED ORDER — POTASSIUM CHLORIDE 10 MEQ/100ML IV SOLN
10.0000 meq | INTRAVENOUS | Status: AC
Start: 2023-07-07 — End: 2023-07-08
  Administered 2023-07-07 – 2023-07-08 (×5): 10 meq via INTRAVENOUS
  Filled 2023-07-07: qty 100

## 2023-07-07 MED ORDER — MAGNESIUM SULFATE IN D5W 1-5 GM/100ML-% IV SOLN
1.0000 g | Freq: Once | INTRAVENOUS | Status: AC
  Administered 2023-07-07: 1 g via INTRAVENOUS
  Filled 2023-07-07: qty 100

## 2023-07-07 MED ORDER — GUAIFENESIN-DM 100-10 MG/5ML PO SYRP
5.0000 mL | ORAL_SOLUTION | ORAL | Status: DC | PRN
Start: 2023-07-07 — End: 2023-07-10
  Administered 2023-07-07: 5 mL via ORAL
  Filled 2023-07-07 (×2): qty 5

## 2023-07-07 MED ORDER — CHLORHEXIDINE GLUCONATE CLOTH 2 % EX PADS
6.0000 | MEDICATED_PAD | Freq: Every day | CUTANEOUS | Status: DC
  Administered 2023-07-07 – 2023-07-10 (×3): 6 via TOPICAL

## 2023-07-07 MED ORDER — POTASSIUM CHLORIDE CRYS ER 20 MEQ PO TBCR
40.0000 meq | EXTENDED_RELEASE_TABLET | ORAL | Status: AC
  Administered 2023-07-07 – 2023-07-08 (×2): 40 meq via ORAL
  Filled 2023-07-07 (×2): qty 2

## 2023-07-07 MED ORDER — ORAL CARE MOUTH RINSE: 15.0000 mL | OROMUCOSAL | Status: DC | PRN

## 2023-07-07 NOTE — Progress Notes (Signed)
 Pt was transferred from ICU/stepdown unit around 2200 last night. Pt was brought over on condom cath. Condom cath slipped off during night, around 3am rounding, pt was noticed to be wet. Pt was cleaned, linen changed and condom cath was replaced with external urinary cath and connected to suction.

## 2023-07-07 NOTE — Progress Notes (Signed)
 Progress Note   Patient: Roy [name] BJY:[id number] DOB: Aug 02, 1950 DOA: 07/02/2023     5 DOS: the patient was seen and examined on 07/07/2023   Brief hospital admission narrative: As per H&P written by Dr. Debby Bud on 07/03/2023 Mr. Villarruel, a 73 y.o. year old male with medical history of atrial fibrillation maintained on Eliquis, adrenal insufficiency, diffuse large B-cell lymphoma, GERD, high cholesterol, hypertension, hospitalization Jan 2025 for posterior spinal fusion with a complicated course and recent admission 2/18-2/22/25 for acute hypoxic respiratory failure because of CHF.Per patient record he was discharged from the hospital 06/29/23 to SNF for rehab.  He has been taking his medication as prescribed. Today he feels weaker than usual.  At rehab He was being walked to the chair by his family, but his legs just gave out.  He was having profound shortness of breath.  He has had shortness of breath with exertion since discharge, unable to walk in his house. EMS activated and on arrival EMT report O2 sats in the 70's. He was transported to [facility] for evaluation. ED Course: T 98.3  SBP in the 70's on arrival but after IVF bolus BP 103/621 HR 60 RR 24, O2 Sat 80's. He was put to high flow oxygen. At admission exam patient was somnolent. Stat VBG revealed hypercarbia with CO2 99. AFter being put to BiPAP patient was much more awake but could not give much hx due to BiPAP mask. Lab: K 2.7 Glucose 162  Alk phso 135 Albumin 3.2, T. Protein 5.8. BNP 255, WBC 9.5 with 90/5/4, Hgb 14.0, INR 1.6, TSH 6.9, FT$ 1.9. Respiratory panel positive for influenza A. CXR no progression in ASD from prior Xray. EKG a. Fib/flutter. TRH called to admit for CAP-influenza A with hypercarbic respiratory failure.   Assessment and Plan: Acute respiratory failure with hypoxia and hypercapnia  -Continue treatment with steroids, bronchodilator management and the use of BiPAP equivalent. -Wean off oxygen supplementation as  tolerated. -There is component of patient's hypoxemia due to vascular congestion from CHF exacerbation and influenza A infection -Continue treatment with diuresis and Tamiflu -Follow clinical response. -Ordering AVAPS-AE for acute respiratory failure with hypercapnia and OHS.  The addition of the auto titrating feature is required for this patient to decrease the PaCO2 more efficiently and rapidly due to the severity of his condition.  Patient continues with persistent critical illness with BiPAP use as prescribed during this episode of care.  Volume ventilation is indicated.  Ordering NIV with AVAPS-AE for use during hours of sleep as well during waking hours when symptomatic.  Home BiPAP/home BiPAP with AVAPS is not appropriate for meeting this patient's ventilatory requirement due to the recent below: BiPAP-AVAPS flow capability is extremely limited (one third of what they may have come the chief).  May have with AVAPS-AE is preferred due to our adjusting EPAP function following for continue upper airway patency throughout the use.  May have with AVAPS AE has dual prescription modality allowing for seamless transition between modes when changes in the patient's clinical condition requires. -CO2 down to the 70s and patient mentation significantly improved.  Pneumonia due to influenza A -Continue Tamiflu and supportive care. -Continue steroids and follow clinical response.  Acute on chronic heart failure with preserved ejection fraction (HFpEF) (HCC) -BNP 444 -Follow echo results -Follow daily weights/strict I's and O's -Low-sodium diet discussed with patient -Continue IV diuresis.  Hypothyroidism -Recent TSH 6.9 -Continue Synthroid.  History of adrenal insufficiency/orthostatic hypotension -Continue treatment with Solu-Cortef and adjusted dose of  midodrine -Continue to follow vital signs. -Blood pressure much better.  Hypokalemia -Associated with adrenal insufficiency and continue use  of diuretics -Magnesium stable -Continue electrolyte repletion and follow trend -Continue telemetry monitoring.  History of atrial fibrillation (HCC) -Rate controlled and stable;  -Continue Eliquis for secondary prevention.  Essential hypertension -Patient with soft blood pressure in the setting of adrenal insufficiency and orthostatic hypotension -Continue treatment with diuresis -Follow vital signs -Continue midodrine and Solu-Cortef as mentioned above -Continue holding any other antihypertensive agents at the moment.  Physical deconditioning -Patient has been seen by physical therapy with recommendation for skilled nursing facility at discharge. -Patient and family in agreement. -TOC aware and helping with discharge plans.   Subjective:  Feeling better, no chest pain, no nausea, no vomiting.  Patient reports good urine output.  2 L nasal cannula in place.  Following commands appropriately and very alert/interactive.  Physical Exam: Vitals:   07/06/23 2100 07/06/23 2321 07/07/23 0000 07/07/23 0626  BP: 118/64 (!) 118/90  116/60  Pulse: 66 69 71 (!) 58  Resp: 17 18 19    Temp:  98.2 F (36.8 C)  97.9 F (36.6 C)  TempSrc:  Oral  Oral  SpO2: 95% 97% 92% 92%  Weight:      Height:       General exam: Alert, awake, oriented x 3; no overnight events.  Denies chest pain, no nausea, no vomiting.  2 L nasal cannula supplementation in place with good saturation. Respiratory system: No wheezing, no using accessory muscles; decreased breath sounds at the bases. Cardiovascular system: Rate controlled, no rubs, no gallops, no JVD. Gastrointestinal system: Abdomen is obese, nondistended, soft and nontender. No organomegaly or masses felt. Normal bowel sounds heard. Central nervous system: No focal neurological deficits. Extremities: No cyanosis or clubbing. Skin: No petechiae. Psychiatry: Mood & affect appropriate.   Latest data Reviewed: ABG: pH 7.36, pCO2 100, pO2 88, bicarb 56.5  and saturation 98.3 Basic metabolic panel: Sodium 143, potassium 2.9, chloride 87, bicarb 45, glucose 102, BUN 14, creatinine 0.67 and GFR >60 CBC: WBCs 8.9, hemoglobin 14.1 and platelet count 186K.  Family Communication: Son updated at bedside.  Disposition: Status is: Inpatient Remains inpatient appropriate because: Continue IV therapy.  Author: Vassie Loll, MD 07/07/2023 3:54 PM  For on call review [email or web address].

## 2023-07-08 DIAGNOSIS — I10 Essential (primary) hypertension: Secondary | ICD-10-CM | POA: Diagnosis not present

## 2023-07-08 DIAGNOSIS — J9601 Acute respiratory failure with hypoxia: Secondary | ICD-10-CM | POA: Diagnosis not present

## 2023-07-08 DIAGNOSIS — J101 Influenza due to other identified influenza virus with other respiratory manifestations: Secondary | ICD-10-CM | POA: Diagnosis not present

## 2023-07-08 DIAGNOSIS — E039 Hypothyroidism, unspecified: Secondary | ICD-10-CM | POA: Diagnosis not present

## 2023-07-08 LAB — BASIC METABOLIC PANEL
BUN: 18 mg/dL (ref 8–23)
BUN: 22 mg/dL (ref 8–23)
CO2: >45 mmol/L — ABNORMAL HIGH (ref 22–32)
CO2: >45 mmol/L — ABNORMAL HIGH (ref 22–32)
Calcium: 8.9 mg/dL (ref 8.9–10.3)
Calcium: 8.9 mg/dL (ref 8.9–10.3)
Chloride: 81 mmol/L — ABNORMAL LOW (ref 98–111)
Chloride: 78 mmol/L — ABNORMAL LOW (ref 98–111)
Creatinine, Ser: 0.53 mg/dL — ABNORMAL LOW (ref 0.61–1.24)
Creatinine, Ser: 0.68 mg/dL (ref 0.61–1.24)
GFR, Estimated: >60 mL/min (ref >60)
GFR, Estimated: >60 mL/min (ref >60)
Glucose, Bld: 132 mg/dL — ABNORMAL HIGH (ref 70–99)
Glucose, Bld: 206 mg/dL — ABNORMAL HIGH (ref 70–99)
Potassium: 2.8 mmol/L — ABNORMAL LOW (ref 3.5–5.1)
Potassium: 2.7 mmol/L — CL (ref 3.5–5.1)
Sodium: 140 mmol/L (ref 135–145)
Sodium: 137 mmol/L (ref 135–145)

## 2023-07-08 LAB — MAGNESIUM
Magnesium: 2.3 mg/dL (ref 1.7–2.4)
Magnesium: 2.2 mg/dL (ref 1.7–2.4)

## 2023-07-08 MED ORDER — PREDNISONE 20 MG PO TABS
40.0000 mg | ORAL_TABLET | Freq: Every day | ORAL | Status: DC
  Administered 2023-07-09 – 2023-07-10 (×2): 40 mg via ORAL
  Filled 2023-07-08 (×2): qty 2

## 2023-07-08 MED ORDER — FLUDROCORTISONE ACETATE 0.1 MG PO TABS
0.1000 mg | ORAL_TABLET | Freq: Every day | ORAL | Status: DC
  Administered 2023-07-08 – 2023-07-10 (×3): 0.1 mg via ORAL
  Filled 2023-07-08 (×6): qty 1

## 2023-07-08 MED ORDER — POTASSIUM CHLORIDE CRYS ER 20 MEQ PO TBCR
40.0000 meq | EXTENDED_RELEASE_TABLET | Freq: Every day | ORAL | Status: DC
  Administered 2023-07-08: 40 meq via ORAL
  Filled 2023-07-08 (×2): qty 2

## 2023-07-08 MED ORDER — POTASSIUM CHLORIDE 10 MEQ/100ML IV SOLN
10.0000 meq | Freq: Once | INTRAVENOUS | Status: AC
  Administered 2023-07-08: 10 meq via INTRAVENOUS
  Filled 2023-07-08: qty 100

## 2023-07-08 MED ORDER — POTASSIUM CHLORIDE CRYS ER 20 MEQ PO TBCR
40.0000 meq | EXTENDED_RELEASE_TABLET | Freq: Once | ORAL | Status: AC
  Administered 2023-07-08: 40 meq via ORAL
  Filled 2023-07-08: qty 2

## 2023-07-08 MED ORDER — FUROSEMIDE 10 MG/ML IJ SOLN
40.0000 mg | Freq: Every day | INTRAMUSCULAR | Status: DC
  Administered 2023-07-08 – 2023-07-09 (×2): 40 mg via INTRAVENOUS
  Filled 2023-07-08 (×2): qty 4

## 2023-07-08 NOTE — Progress Notes (Signed)
 Patient having trouble sleeping with BiPAP -- requested to be taken off BiPAP placed on oxygen.

## 2023-07-08 NOTE — Progress Notes (Signed)
 Progress Note   Patient: Roy [name] EAV:[id number] DOB: [birth year] DOA: 07/02/2023     6 DOS: the patient was seen and examined on 07/08/2023   Brief hospital admission narrative: As per H&P written by Dr. Debby Bud on 07/03/2023 [name], a 73 y.o. year old male with medical history of atrial fibrillation maintained on Eliquis, adrenal insufficiency, diffuse large B-cell lymphoma, GERD, high cholesterol, hypertension, hospitalization Jan 2025 for posterior spinal fusion with a complicated course and recent admission 2/18-2/22/25 for acute hypoxic respiratory failure because of CHF.Per patient record he was discharged from the hospital 06/29/23 to SNF for rehab.  He has been taking his medication as prescribed. Today he feels weaker than usual.  At rehab He was being walked to the chair by his family, but his legs just gave out.  He was having profound shortness of breath.  He has had shortness of breath with exertion since discharge, unable to walk in his house. EMS activated and on arrival EMT report O2 sats in the 70's. He was transported to [facility]. ED Course: T 98.3  SBP in the 70's on arrival but after IVF bolus BP 103/621 HR 60 RR 24, O2 Sat 80's. He was put to high flow oxygen. At admission exam patient was somnolent. Stat VBG revealed hypercarbia with CO2 99. AFter being put to BiPAP patient was much more awake but could not give much hx due to BiPAP mask. Lab: K 2.7 Glucose 162  Alk phso 135 Albumin 3.2, T. Protein 5.8. BNP 255, WBC 9.5 with 90/5/4, Hgb 14.0, INR 1.6, TSH 6.9, FT$ 1.9. Respiratory panel positive for influenza A. CXR no progression in ASD from prior Xray. EKG a. Fib/flutter. TRH called to admit for CAP-influenza A with hypercarbic respiratory failure.   Assessment and Plan: Acute respiratory failure with hypoxia and hypercapnia  -Continue treatment with steroids, bronchodilator management and the use of BiPAP equivalent. -Wean off oxygen supplementation as  tolerated. -There is component of patient's hypoxemia due to vascular congestion from CHF exacerbation and influenza A infection -Continue treatment with diuresis and Tamiflu -Follow clinical response. -Ordering AVAPS-AE for acute respiratory failure with hypercapnia and OHS.  The addition of the auto titrating feature is required for this patient to decrease the PaCO2 more efficiently and rapidly due to the severity of his condition.  Patient continues with persistent critical illness with BiPAP use as prescribed during this episode of care.  Volume ventilation is indicated.  Ordering NIV with AVAPS-AE for use during hours of sleep as well during waking hours when symptomatic.  Home BiPAP/home BiPAP with AVAPS is not appropriate for meeting this patient's ventilatory requirement due to the recent below: BiPAP-AVAPS flow capability is extremely limited (one third of what they may have come the chief).  May have with AVAPS-AE is preferred due to our adjusting EPAP function following for continue upper airway patency throughout the use.  May have with AVAPS AE has dual prescription modality allowing for seamless transition between modes when changes in the patient's clinical condition requires. -CO2 down to the 70s and patient mentation significantly improved.  Pneumonia due to influenza A -Patient has completed treatment with Tamiflu. -Continue steroids; statin tapering down.  Acute on chronic heart failure with preserved ejection fraction (HFpEF) (HCC) -BNP 444 -Follow echo results -Follow daily weights/strict I's and O's -Low-sodium diet discussed with patient -Continue IV diuresis; with intention to transition to oral route on 07/09/2023.Marland Kitchen  Hypothyroidism -Recent TSH 6.9 -Continue Synthroid.  History of adrenal insufficiency/orthostatic  hypotension -Continue treatment with Solu-Cortef and adjusted dose of midodrine -Continue to follow vital signs. -Blood pressure much  better.  Hypokalemia -Associated with adrenal insufficiency and continue use of diuretics -Magnesium stable -Potassium down to 1.8; continue repletion and a start daily maintenance.  History of atrial fibrillation (HCC) -Rate controlled and stable;  -Continue Eliquis for secondary prevention.  Essential hypertension -Patient with soft blood pressure in the setting of adrenal insufficiency and orthostatic hypotension -Continue treatment with diuresis -Follow vital signs -Continue midodrine and Solu-Cortef as mentioned above -Continue holding any other antihypertensive agents at the moment.  Physical deconditioning -Patient has been seen by physical therapy with recommendation for skilled nursing facility at discharge. -Patient and family in agreement. -TOC aware and helping with discharge plans.   Subjective:  Weak and deconditioned; no chest pain, no nausea vomiting.  Good saturation on 2 L.  In no acute distress.  Physical Exam: Vitals:   07/08/23 1400 07/08/23 1500 07/08/23 1600 07/08/23 1705  BP: 134/78  128/74   Pulse: 60 65 61   Resp: 18 15 17    Temp:    (!) 96.7 F (35.9 C)  TempSrc:    Axillary  SpO2: 95% 95% 97%   Weight:      Height:       General exam: Alert, awake, oriented x 3; in no acute distress, no chest pain, no nausea or vomiting.  Reports breathing is stable. Respiratory system: Improved air movement bilaterally; no using accessory muscle.  2 L nasal cannula in place. Cardiovascular system: Rate controlled, no rubs, no gallops, no JVD on exam. Gastrointestinal system: Abdomen is obese, nondistended, soft and nontender. No organomegaly or masses felt. Normal bowel sounds heard. Central nervous system:No focal neurological deficits. Extremities: No cyanosis or clubbing. Skin: No petechiae. Psychiatry: Mood and affect appropriate.  Latest data Reviewed: ABG: pH 7.36, pCO2 100, pO2 88, bicarb 56.5 and saturation 98.3 Basic metabolic panel: Sodium 140,  potassium 2.8, chloride 1 BUN 18, creatinine 0.53 and GFR >60 CBC: WBCs 8.9, hemoglobin 14.1 and platelet count 186K.  Family Communication: Son updated at bedside.  Disposition: Status is: Inpatient Remains inpatient appropriate because: Continue IV therapy.  Author: Vassie Loll, MD 07/08/2023 6:33 PM  For on call review [email or web address].

## 2023-07-08 NOTE — TOC Progression Note (Signed)
 Transition of Care [facility]) - Progression Note    Patient Details  Name: Roy [name] MRN: [id number] Date of Birth: 03/23/1951  Transition of Care [facility]) CM/SW Contact  Karn Cassis, [place] Phone Number: 07/08/2023, 3:19 PM  Clinical Narrative:  LCSW discussed bed offers with pt at bedside. Pt's preference is to return to [facility]. He requests that LCSW also call his son. [facility] is reviewing. They are aware pt will need bipap at night. Pt's son updated. TOC will follow up in AM.      Expected Discharge Plan: Skilled Nursing Facility Barriers to Discharge: Continued Medical Work up  Expected Discharge Plan and Services In-house Referral: Clinical Social Work   Post Acute Care Choice: Skilled Nursing Facility Living arrangements for the past 2 months: Single Family Home                 DME Arranged: NIV DME Agency: Other - Comment Retia Passe) Date DME Agency Contacted: 07/05/23   Representative spoke with at DME Agency: Marylene Land HH Arranged: PT, OT HH Agency: [facility] Date [facility] Agency Contacted: 07/03/23 Time HH Agency Contacted: [phone] Representative spoke with at [facility] Agency: Victorino Dike   Social Determinants of Health (SDOH) Interventions SDOH Screenings   Food Insecurity: No Food Insecurity (07/03/2023)  Housing: Low Risk  (07/03/2023)  Transportation Needs: No Transportation Needs (07/03/2023)  Utilities: Not At Risk (07/03/2023)  Alcohol Screen: Low Risk  (11/08/2020)  Depression (PHQ2-9): Low Risk  (11/08/2020)  Financial Resource Strain: Low Risk  (05/15/2023)   Received from [facility]  Physical Activity: Unknown (04/25/2023)   Received from [facility]  Recent Concern: Physical Activity - Inactive (04/25/2023)   Received from [facility]  Social Connections: Moderately Isolated (07/03/2023)  Stress: No Stress Concern Present (04/25/2023)   Received from [facility]  Tobacco Use: Low Risk  (07/02/2023)    Readmission Risk Interventions    07/03/2023    8:45  AM 06/28/2023    7:56 AM 07/17/2021   12:49 PM  Readmission Risk Prevention Plan  Transportation Screening Complete Complete Complete  HRI or Home Care Consult Complete Complete Complete  Social Work Consult for Recovery Care Planning/Counseling Complete Complete Complete  Palliative Care Screening Not Applicable Not Applicable Not Applicable  Medication Review Oceanographer) Complete Complete Complete

## 2023-07-08 NOTE — Progress Notes (Signed)
 Critical results documentation  07/08/23 0600  Provider Notification  Provider Name/Title Dr. Marcial Pacas opyd  Date Provider Notified 07/08/23  Time Provider Notified 0602  Method of Notification Page  Notification Reason Critical Result  Test performed and critical result Potassium 2.8  Date Critical Result Received 07/08/23  Time Critical Result Received 0530  Provider response Other (Comment) (awaiting)

## 2023-07-09 DIAGNOSIS — J9601 Acute respiratory failure with hypoxia: Secondary | ICD-10-CM | POA: Diagnosis not present

## 2023-07-09 DIAGNOSIS — J101 Influenza due to other identified influenza virus with other respiratory manifestations: Secondary | ICD-10-CM | POA: Diagnosis not present

## 2023-07-09 DIAGNOSIS — E039 Hypothyroidism, unspecified: Secondary | ICD-10-CM | POA: Diagnosis not present

## 2023-07-09 DIAGNOSIS — I10 Essential (primary) hypertension: Secondary | ICD-10-CM | POA: Diagnosis not present

## 2023-07-09 LAB — BASIC METABOLIC PANEL
BUN: 20 mg/dL (ref 8–23)
CO2: >45 mmol/L — ABNORMAL HIGH (ref 22–32)
Calcium: 8.9 mg/dL (ref 8.9–10.3)
Chloride: 85 mmol/L — ABNORMAL LOW (ref 98–111)
Creatinine, Ser: 0.6 mg/dL — ABNORMAL LOW (ref 0.61–1.24)
GFR, Estimated: >60 mL/min (ref >60)
Glucose, Bld: 81 mg/dL (ref 70–99)
Potassium: 2.8 mmol/L — ABNORMAL LOW (ref 3.5–5.1)
Sodium: 140 mmol/L (ref 135–145)

## 2023-07-09 MED ORDER — TORSEMIDE 20 MG PO TABS
40.0000 mg | ORAL_TABLET | Freq: Every day | ORAL | Status: DC
  Administered 2023-07-10: 40 mg via ORAL
  Filled 2023-07-09: qty 2

## 2023-07-09 MED ORDER — POTASSIUM CHLORIDE 10 MEQ/100ML IV SOLN
10.0000 meq | Freq: Once | INTRAVENOUS | Status: AC
Start: 2023-07-09 — End: 2023-07-09
  Administered 2023-07-09: 10 meq via INTRAVENOUS
  Filled 2023-07-09: qty 100

## 2023-07-09 MED ORDER — POTASSIUM CHLORIDE CRYS ER 20 MEQ PO TBCR
40.0000 meq | EXTENDED_RELEASE_TABLET | Freq: Two times a day (BID) | ORAL | Status: DC
  Administered 2023-07-09 (×2): 40 meq via ORAL
  Filled 2023-07-09 (×3): qty 2

## 2023-07-09 MED ORDER — POTASSIUM CHLORIDE CRYS ER 20 MEQ PO TBCR
40.0000 meq | EXTENDED_RELEASE_TABLET | Freq: Once | ORAL | Status: AC
  Administered 2023-07-09: 40 meq via ORAL
  Filled 2023-07-09: qty 2

## 2023-07-09 NOTE — TOC Progression Note (Signed)
 Transition of Care [facility]) - Progression Note    Patient Details  Name: Roy [name] MRN: [id number] Date of Birth: [birth year]  Transition of Care [facility]) CM/SW Contact  Karn Cassis, [place] Phone Number: 07/09/2023, 10:06 AM  Clinical Narrative:  [facility] unable to offer bed. Bed accepted at [facility]. Debbie at [facility] notified. LCSW requested SNF order BiPAP. Insurance updated of bed selected for authorization.     Expected Discharge Plan: Skilled Nursing Facility Barriers to Discharge: Continued Medical Work up  Expected Discharge Plan and Services In-house Referral: Clinical Social Work   Post Acute Care Choice: Skilled Nursing Facility Living arrangements for the past 2 months: Single Family Home                 DME Arranged: NIV DME Agency: Other - Comment Retia Passe) Date DME Agency Contacted: 07/05/23   Representative spoke with at DME Agency: Marylene Land HH Arranged: PT, OT HH Agency: [facility] Date [facility] Agency Contacted: 07/03/23 Time HH Agency Contacted: [phone] Representative spoke with at [facility] Agency: Victorino Dike   Social Determinants of Health (SDOH) Interventions SDOH Screenings   Food Insecurity: No Food Insecurity (07/03/2023)  Housing: Low Risk  (07/03/2023)  Transportation Needs: No Transportation Needs (07/03/2023)  Utilities: Not At Risk (07/03/2023)  Alcohol Screen: Low Risk  (11/08/2020)  Depression (PHQ2-9): Low Risk  (11/08/2020)  Financial Resource Strain: Low Risk  (05/15/2023)   Received from [facility]  Physical Activity: Unknown (04/25/2023)   Received from [facility]  Recent Concern: Physical Activity - Inactive (04/25/2023)   Received from [facility]  Social Connections: Moderately Isolated (07/03/2023)  Stress: No Stress Concern Present (04/25/2023)   Received from [facility]  Tobacco Use: Low Risk  (07/02/2023)    Readmission Risk Interventions    07/03/2023    8:45 AM 06/28/2023    7:56 AM 07/17/2021   12:49 PM   Readmission Risk Prevention Plan  Transportation Screening Complete Complete Complete  HRI or Home Care Consult Complete Complete Complete  Social Work Consult for Recovery Care Planning/Counseling Complete Complete Complete  Palliative Care Screening Not Applicable Not Applicable Not Applicable  Medication Review Oceanographer) Complete Complete Complete

## 2023-07-09 NOTE — Progress Notes (Addendum)
 Progress Note   Patient: Roy [name] ION:[id number] DOB: February 04, 1951 DOA: 07/02/2023     7 DOS: the patient was seen and examined on 07/09/2023   Brief hospital admission narrative: As per H&P written by Dr. Debby Bud on 07/03/2023 Roy [name], a 73 y.o. year old male with medical history of atrial fibrillation maintained on Eliquis, adrenal insufficiency, diffuse large B-cell lymphoma, GERD, high cholesterol, hypertension, hospitalization Jan 2025 for posterior spinal fusion with a complicated course and recent admission 2/18-2/22/25 for acute hypoxic respiratory failure because of CHF.Per patient record he was discharged from the hospital 06/29/23 to SNF for rehab.  He has been taking his medication as prescribed. Today he feels weaker than usual.  At rehab He was being walked to the chair by his family, but his legs just gave out.  He was having profound shortness of breath.  He has had shortness of breath with exertion since discharge, unable to walk in his house. EMS activated and on arrival EMT report O2 sats in the 70's. He was transported to [facility]. ED Course: T 98.3  SBP in the 70's on arrival but after IVF bolus BP 103/621 HR 60 RR 24, O2 Sat 80's. He was put to high flow oxygen. At admission exam patient was somnolent. Stat VBG revealed hypercarbia with CO2 99. AFter being put to BiPAP patient was much more awake but could not give much hx due to BiPAP mask. Lab: K 2.7 Glucose 162  Alk phso 135 Albumin 3.2, T. Protein 5.8. BNP 255, WBC 9.5 with 90/5/4, Hgb 14.0, INR 1.6, TSH 6.9, FT$ 1.9. Respiratory panel positive for influenza A. CXR no progression in ASD from prior Xray. EKG a. Fib/flutter. TRH called to admit for CAP-influenza A with hypercarbic respiratory failure.   Assessment and Plan: Acute respiratory failure with hypoxia and hypercapnia  -Continue treatment with steroids, bronchodilator management and the use of BiPAP equivalent. -cotninue to Wean off oxygen supplementation  as tolerated. -There is component of patient's hypoxemia due to vascular congestion from CHF exacerbation and influenza A infection -Continue treatment with diuresis and Tamiflu -Follow clinical response. -Ordering AVAPS-AE for acute respiratory failure with hypercapnia and OHS.  The addition of the auto titrating feature is required for this patient to decrease the PaCO2 more efficiently and rapidly due to the severity of his condition.  Patient continues with persistent critical illness with BiPAP use as prescribed during this episode of care.  Volume ventilation is indicated.  Ordering NIV with AVAPS-AE for use during hours of sleep as well during waking hours when symptomatic.  Home BiPAP/home BiPAP with AVAPS is not appropriate for meeting this patient's ventilatory requirement due to the recent below: BiPAP-AVAPS flow capability is extremely limited (one third of what they may have come the chief).  May have with AVAPS-AE is preferred due to our adjusting EPAP function following for continue upper airway patency throughout the use.  May have with AVAPS AE has dual prescription modality allowing for seamless transition between modes when changes in the patient's clinical condition requires. -CO2 down to the 70s and patient mentation significantly improved.  Pneumonia due to influenza A -Patient has completed treatment with Tamiflu. -Continue steroids; statin tapering down.  Acute on chronic heart failure with preserved ejection fraction (HFpEF) (HCC) -BNP 444 -Follow echo results -Follow daily weights/strict I's and O's -Low-sodium diet discussed with patient -Continue diuresis, but transitioned to oral route.  Hypothyroidism -Recent TSH 6.9 -Continue Synthroid.  History of adrenal insufficiency/orthostatic hypotension -Continue treatment  with florinef and adjusted dose of midodrine -weaned off solucortef and planning steroid tapering. -Continue to follow vital signs. -Blood pressure  much better.  Hypokalemia -Associated with adrenal insufficiency and continue use of diuretics -Magnesium stable -Potassium 2.8 currently receiving acute repletion and starting maintenance.  History of atrial fibrillation (HCC) -Rate controlled and stable;  -Continue Eliquis for secondary prevention.  Essential hypertension -Patient with soft blood pressure in the setting of adrenal insufficiency and orthostatic hypotension -Continue treatment with diuresis -Follow vital signs -Continue midodrine and Solu-Cortef as mentioned above -Continue holding any other antihypertensive agents at the moment.  Physical deconditioning -Patient has been seen by physical therapy with recommendation for skilled nursing facility at discharge. -Patient and family in agreement. -TOC aware and helping with discharge plans.   Subjective:  Weak and deconditioned; in no acute distress.  Overall feeling better.  Euvolemic and demonstrating good saturation on 2 L supplementation.  Patient has been compliant with the use of BiPAP nightly.  Physical Exam: Vitals:   07/09/23 0500 07/09/23 0600 07/09/23 0800 07/09/23 1019  BP: (!) 139/90 138/79 139/83 118/76  Pulse: (!) 59 60 61 71  Resp: 14 12 18 17   Temp:   97.8 F (36.6 C)   TempSrc:   Oral   SpO2: 99% 98%  96%  Weight: 116 kg     Height:       General exam: Alert, awake, oriented x 3; sitting up bedside chair reporting no chest pain, no nausea, no vomiting, no lightheadedness.  Good saturation on 2 L.  No overnight events Respiratory system: Improved air movement bilaterally; no frank crackles.  No using accessory muscles Cardiovascular system: Rate controlled, no rubs, no gallops, no JVD. Gastrointestinal system: Abdomen is obese, nondistended, soft and nontender. No organomegaly or masses felt. Normal bowel sounds heard. Central nervous system: No focal neurological deficits. Extremities: No cyanosis or clubbing Skin: No petechiae. Psychiatry:  Judgement and insight appear normal. Mood & affect appropriate.   Latest data Reviewed: ABG: pH 7.36, pCO2 100, pO2 88, bicarb 56.5 and saturation 98.3 Basic metabolic panel: Sodium 140, potassium 2.8, chloride 85, CO2 45, glucose 81, BUN 20, creatinine 0.60 and GFR >60 CBC: WBCs 8.9, hemoglobin 14.1 and platelet count 186K.  Family Communication: no familyat bedside.  Disposition: Status is: Inpatient Remains inpatient appropriate because: transition off IV diuretics, replete electrolytes and stop solucortef.  Author: Vassie Loll, MD 07/09/2023 11:06 AM  For on call review [email or web address].

## 2023-07-09 NOTE — Plan of Care (Signed)
  Problem: Clinical Measurements: Goal: Respiratory complications will improve Outcome: Progressing   Problem: Nutrition: Goal: Adequate nutrition will be maintained Outcome: Progressing   Problem: Coping: Goal: Level of anxiety will decrease Outcome: Progressing   Problem: Pain Managment: Goal: General experience of comfort will improve and/or be controlled Outcome: Progressing

## 2023-07-10 ENCOUNTER — Encounter: Payer: Self-pay | Admitting: Internal Medicine

## 2023-07-10 DIAGNOSIS — J189 Pneumonia, unspecified organism: Secondary | ICD-10-CM | POA: Diagnosis not present

## 2023-07-10 DIAGNOSIS — I480 Paroxysmal atrial fibrillation: Secondary | ICD-10-CM

## 2023-07-10 DIAGNOSIS — E2740 Unspecified adrenocortical insufficiency: Secondary | ICD-10-CM | POA: Diagnosis not present

## 2023-07-10 DIAGNOSIS — E876 Hypokalemia: Secondary | ICD-10-CM | POA: Diagnosis not present

## 2023-07-10 LAB — BASIC METABOLIC PANEL
Anion gap: 8 (ref 5–15)
BUN: 20 mg/dL (ref 8–23)
CO2: 45 mmol/L — ABNORMAL HIGH (ref 22–32)
Calcium: 8.9 mg/dL (ref 8.9–10.3)
Chloride: 87 mmol/L — ABNORMAL LOW (ref 98–111)
Creatinine, Ser: 0.57 mg/dL — ABNORMAL LOW (ref 0.61–1.24)
GFR, Estimated: >60 mL/min (ref >60)
Glucose, Bld: 75 mg/dL (ref 70–99)
Potassium: 3 mmol/L — ABNORMAL LOW (ref 3.5–5.1)
Sodium: 140 mmol/L (ref 135–145)

## 2023-07-10 LAB — MAGNESIUM: Magnesium: 2.2 mg/dL (ref 1.7–2.4)

## 2023-07-10 MED ORDER — GUAIFENESIN ER 600 MG PO TB12: 600.0000 mg | ORAL_TABLET | Freq: Two times a day (BID) | ORAL | 2 refills | Status: AC

## 2023-07-10 MED ORDER — PREGABALIN 200 MG PO CAPS: 200.0000 mg | ORAL_CAPSULE | Freq: Two times a day (BID) | ORAL | 1 refills | Status: DC

## 2023-07-10 MED ORDER — POTASSIUM CHLORIDE CRYS ER 20 MEQ PO TBCR
40.0000 meq | EXTENDED_RELEASE_TABLET | ORAL | Status: AC
  Administered 2023-07-10 (×2): 40 meq via ORAL
  Filled 2023-07-10 (×2): qty 2

## 2023-07-10 MED ORDER — TORSEMIDE 20 MG PO TABS: 20.0000 mg | ORAL_TABLET | Freq: Every day | ORAL | 1 refills | Status: DC

## 2023-07-10 MED ORDER — POTASSIUM CHLORIDE ER 20 MEQ PO TBCR: 20.0000 meq | EXTENDED_RELEASE_TABLET | Freq: Every day | ORAL | 1 refills | Status: DC

## 2023-07-10 NOTE — TOC Transition Note (Signed)
 Transition of Care [facility]) - Discharge Note   Patient Details  Name: Roy [name] MRN: [id number] Date of Birth: 1950-12-12  [facility]) CM/SW Contact:  Karn Cassis, LCSW Phone Number: 07/10/2023, 2:45 PM   Clinical Narrative: Pt d/c today to [facility]. Auth received. Pt, son, and facility aware and agreeable. D/C summary sent to SNF. Family to provide transport this afternoon. RN given number to call report.       Final next level of care: Skilled Nursing Facility Barriers to Discharge: Barriers Resolved   Patient Goals and CMS Choice Patient states their goals for this hospitalization and ongoing recovery are:: rehab CMS Medicare.gov Compare Post Acute Care list provided to:: Patient Choice offered to / list presented to : Patient Mena ownership interest in [facility].provided to::  (n/a)    Discharge Placement              Patient chooses bed at: Other - please specify in the comment section below: [facility]) Patient to be transferred to facility by: family Name of family member notified: son- Viviann Spare Patient and family notified of of transfer: 07/10/23  Discharge Plan and Services Additional resources added to the After Visit Summary for   In-house Referral: Clinical Social Work   Post Acute Care Choice: Skilled Nursing Facility          DME Arranged: NIV DME Agency: Other - Comment Retia Passe) Date DME Agency Contacted: 07/05/23   Representative spoke with at DME Agency: Marylene Land HH Arranged: PT, OT [facility] Agency: [facility] Date [facility] Agency Contacted: 07/03/23 Time HH Agency Contacted: [phone] Representative spoke with at [facility] Agency: Victorino Dike  Social Drivers of Health (SDOH) Interventions SDOH Screenings   Food Insecurity: No Food Insecurity (07/03/2023)  Housing: Low Risk  (07/03/2023)  Transportation Needs: No Transportation Needs (07/03/2023)  Utilities: Not At Risk (07/03/2023)  Alcohol Screen: Low Risk   (11/08/2020)  Depression (PHQ2-9): Low Risk  (11/08/2020)  Financial Resource Strain: Low Risk  (05/15/2023)   Received from [facility]  Physical Activity: Unknown (04/25/2023)   Received from [facility]  Recent Concern: Physical Activity - Inactive (04/25/2023)   Received from [facility]  Social Connections: Moderately Isolated (07/03/2023)  Stress: No Stress Concern Present (04/25/2023)   Received from [facility]  Tobacco Use: Low Risk  (07/02/2023)     Readmission Risk Interventions    07/03/2023    8:45 AM 06/28/2023    7:56 AM 07/17/2021   12:49 PM  Readmission Risk Prevention Plan  Transportation Screening Complete Complete Complete  HRI or Home Care Consult Complete Complete Complete  Social Work Consult for Recovery Care Planning/Counseling Complete Complete Complete  Palliative Care Screening Not Applicable Not Applicable Not Applicable  Medication Review Oceanographer) Complete Complete Complete

## 2023-07-10 NOTE — Discharge Summary (Addendum)
 Roy [name], is a 73 y.o. male  DOB 23-Nov-1950  MRN [id number].  Admission date:  07/02/2023  Admitting Physician  Jacques Navy, MD  Discharge Date:  07/10/2023   Primary MD  Rebecka Apley, NP  Recommendations for primary care physician for things to follow:  1)You are taking Eliquis/Apixaban which is a blood thinner----so Please Avoid ibuprofen/Advil/Aleve/Motrin/Goody Powders/Naproxen/BC powders/Meloxicam/Diclofenac/Indomethacin and other Nonsteroidal anti-inflammatory medications as these will make you more likely to bleed and can cause stomach ulcers, can also cause Kidney problems.  2)Repeat BMP and CBC Blood Test around Monday on 07/15/2023  3)You need oxygen at home at 2 L via nasal cannula continuously while awake and while asleep--- smoking or having open fires around oxygen can cause fire, significant injury and death  4)Please use CPAP/BiPAP at bedtime and prn sleep/naps  5)Outpatient follow-up with cardiologist--due to wall motion abnormalities on echocardiogram (-Echo with EF of 55 to 60%, mild concentric LVH noted, moderate aortic Valve stenosis noted , left ventricular wall motion abnormalities noted, (The basal inferior segment is hypokinetic. The entire anterior wall, entire lateral wall, entire septum, entire apex, and mid and distal inferior wall are normal)  6)Very Low-salt diet advised---Less than 2 gm of Sodium per day advised----ok to use Mrs DASH salt substitute instead of Salt  7)Weigh yourself daily, call if you gain more than 3 pounds in 1 day or more than 5 pounds in 1 week as your diuretic medications may need to be adjusted    Admission Diagnosis  Acute hypokalemia [E87.6] Influenza A [J10.1] CAP (community acquired pneumonia) [J18.9] Acute hypoxemic respiratory failure (HCC) [J96.01]   Discharge Diagnosis  Acute hypokalemia [E87.6] Influenza A [J10.1] CAP  (community acquired pneumonia) [J18.9] Acute hypoxemic respiratory failure (HCC) [J96.01]    Principal Problem:   CAP (community acquired pneumonia) Active Problems:   Acute on chronic heart failure with preserved ejection fraction (HFpEF) (HCC)   CAP (community acquired pneumonia) due to influenza A virus   Atrial fibrillation (HCC)   Hypokalemia   Adrenal insufficiency (HCC)   Hypothyroidism   Essential hypertension      Past Medical History:  Diagnosis Date   Atrial fibrillation (HCC)    Cancer (HCC)    Diffuse Large B-Cell lymphoma   GERD (gastroesophageal reflux disease)    High cholesterol    Hypertension    Obesity    Pacemaker     Past Surgical History:  Procedure Laterality Date   APPENDECTOMY     BIOPSY  06/28/2023   Procedure: BIOPSY;  Surgeon: Corbin Ade, MD;  [facility];  Service: Endoscopy;;   CATARACT EXTRACTION Bilateral 2022   CHOLECYSTECTOMY     ESOPHAGEAL DILATION     multiple times   ESOPHAGOGASTRODUODENOSCOPY (EGD) WITH PROPOFOL N/A 06/28/2023   Procedure: ESOPHAGOGASTRODUODENOSCOPY (EGD) WITH PROPOFOL;  Surgeon: Corbin Ade, MD;  [facility];  Service: Endoscopy;  Laterality: N/A;   IR IMAGING GUIDED PORT INSERTION  03/31/2021   JOINT REPLACEMENT Left    hip  LUMBAR PERCUTANEOUS PEDICLE SCREW 3 LEVEL  05/27/2023   Procedure: POSTERIOR FIXATION AND FUSION OF THORACIC ELEVEN-THORACIC TWELVE FRACTURE DISLOCATION. FIXATION FROM THORACIC TEN-THORACIC ELEVEN METHACRYLATE AUGMENTATION WITH FLUOROSCOPIC GUIDANCE;  Surgeon: Barnett Abu, MD;  Location: [facility];  Service: Neurosurgery;;   Elease Hashimoto DILATION N/A 06/28/2023   Procedure: Alvy Beal;  Surgeon: Corbin Ade, MD;  [facility];  Service: Endoscopy;  Laterality: N/A;   PACEMAKER IMPLANT N/A 09/07/2022   Procedure: PACEMAKER IMPLANT;  Surgeon: Regan Lemming, MD;  Location: [facility];  Service: Cardiovascular;  Laterality: N/A;     HPI   from the history and physical done on the day of admission:    Patient coming from: SNF   I have personally briefly reviewed patient's old medical records in [facility]   Roy [name], a 73 y.o. year old male with medical history of atrial fibrillation maintained on Eliquis, adrenal insufficiency, diffuse large B-cell lymphoma, GERD, high cholesterol, hypertension, hospitalization Jan 2025 for posterior spinal fusion with a complicated course and recent admission 2/18-2/22/25 for acute hypoxic respiratory failure because of CHF.Per patient record he was discharged from the hospital 06/29/23 to SNF for rehab.  He has been taking his medication as prescribed. Today he feels weaker than usual.  At rehab He was being walked to the chair by his family, but his legs just gave out.  He was having profound shortness of breath.  He has had shortness of breath with exertion since discharge, unable to walk in his house. EMS activated and on arrival EMT report O2 sats in the 70's. [facility] was transported to [facility] for [facility].       ED Course: T 98.3  SBP in the 70's on arrival but after IVF bolus BP 103/621 HR 60 RR 24, O2 Sat 80's. He was put to high flow oxygen. At admission exam patient was somnolent. Stat VBG revealed hypercarbia with CO2 99. AFter being put to BiPAP patient was much more awake but could not give much hx due to BiPAP mask. Lab: K 2.7 Glucose 162  Alk phso 135 Albumin 3.2, T. Protein 5.8. BNP 255, WBC 9.5 with 90/5/4, Hgb 14.0, INR 1.6, TSH 6.9, FT$ 1.9. Respiratory panel positive for influenza A. CXR no progression in ASD from prior Xray. EKG a. Fib/flutter. TRH called to admit for CAP-influenza A with hypercarbic respiratory failure.      Hospital Course:   1)Acute Respiratory Failure with Hypoxia and Hypercapnia due to influenza A infection and CHF -Much improved after treatment with steroids, bronchodilator, Tamiflu and diuresis as well as BiPAP use --Oxygen requirement is down to 2 L  via nasal cannula --Ordering AVAPS-AE for acute respiratory failure with hypercapnia and OHS.  The addition of the auto titrating feature is required for this patient to decrease the PaCO2 more efficiently and rapidly due to the severity of his condition.  Patient continues with persistent critical illness with BiPAP use as prescribed during this episode of care.  Volume ventilation is indicated.  - Recommend NIV with AVAPS-AE for use during hours of sleep as well during waking hours when symptomatic.  - Home BiPAP/home BiPAP with AVAPS is not appropriate for meeting this patient's ventilatory requirement due to the recent below: BiPAP-AVAPS flow capability is extremely limited (one third of what they may have come the chief).  May have with AVAPS-AE is preferred due to our adjusting EPAP function following for continue upper airway patency throughout the use.  May have with  AVAPS AE has dual prescription modality allowing for seamless transition between modes when changes in the patient's clinical condition requires. -CO2 down to the 70s and patient mentation significantly improved.   2)Pneumonia due to influenza A -Patient has completed treatment with Tamiflu. -Steroids have been tapered   3)Acute on chronic heart failure with preserved ejection fraction (HFpEF) (HCC) -BNP 444 --Echo with EF of 55 to 60%, mild concentric LVH noted, moderate aortic Valve stenosis noted , left ventricular wall motion abnormalities noted, (The basal inferior segment is hypokinetic. The entire anterior wall, entire lateral wall, entire septum, entire apex, and mid and distal inferior wall  are normal) -Low-sodium diet discussed with patient -Treated with iv Lasix, ok to dc on oral Torsemide and potassium --Outpatient follow-up cardiologist for further evaluation of wall motion abnormalities advised--- May need stress test   4)Hypothyroidism -Recent TSH 6.9 -Continue Synthroid.   5)History of adrenal  insufficiency/orthostatic hypotension -Continue PTA florinef and adjusted dose of midodrine -weaned off solucortef and  steroid taper - 6)Hypokalemia -Associated with adrenal insufficiency and continue use of diuretics -Magnesium stable -replaced   7)History of atrial fibrillation (HCC) -Rate controlled and stable despite not being on negative chronotropic agents -Continue Eliquis for stroke prevention.   8)Essential hypertension -Patient was soft blood pressure in the setting of adrenal insufficiency and orthostatic hypotension -Midodrine and Florinef as above   9)Physical deconditioning -Patient has been seen by physical therapy with recommendation for skilled nursing facility at discharge. -Patient and family in agreement. -- Discharge Condition: stable  Follow UP   Contact information for follow-up providers     Mallipeddi, Vishnu P, MD. Schedule an appointment as soon as possible for a visit in 2 week(s).   Specialties: Cardiology, Internal Medicine Why: Outpatient follow-up with cardiologist--due to wall motion abnormalities on echocardiogram (-Echo with EF of 55 to 60%, mild concentric LVH noted, moderate aortic Valve stenosis noted , left ventricular wall motion abnormalities noted, (The basal inferior segment is hypokinetic. The entire anterior wall, entire lateral wall, entire septum, entire apex, and mid and distal inferior wall are normal) Contact information: [address] [place] [address] [phone]              Contact information for after-discharge care     Destination     [facility] .   Service: Skilled Nursing Contact information: [address] [place] [address] [phone]                    Diet and Activity recommendation:  As advised  Discharge Instructions    Discharge Instructions     Call MD for:  difficulty breathing, headache or visual disturbances    Complete by: As directed    Call MD for:  persistant dizziness or light-headedness   Complete by: As directed    Call MD for:  persistant nausea and vomiting   Complete by: As directed    Call MD for:  temperature >100.4   Complete by: As directed    Diet - low sodium heart healthy   Complete by: As directed    Discharge instructions   Complete by: As directed    1)You are taking Eliquis/Apixaban which is a blood thinner----so Please Avoid ibuprofen/Advil/Aleve/Motrin/Goody Powders/Naproxen/BC powders/Meloxicam/Diclofenac/Indomethacin and other Nonsteroidal anti-inflammatory medications as these will make you more likely to bleed and can cause stomach ulcers, can also cause Kidney problems.  2)Repeat BMP and CBC Blood Test around Monday on 07/15/2023  3)You need oxygen at home at 2 L via nasal cannula continuously while awake and while asleep--- smoking or having open fires around oxygen can cause fire, significant injury and death  4)Please use CPAP/BiPAP at bedtime and prn sleep/naps  5)Outpatient follow-up with cardiologist--due to wall motion abnormalities on echocardiogram (-Echo with EF of 55 to 60%, mild concentric LVH noted, moderate aortic Valve stenosis noted , left ventricular wall motion abnormalities noted, (The basal inferior segment is hypokinetic. The entire anterior wall, entire lateral wall, entire septum, entire apex, and mid and distal inferior wall are normal)  6)Very Low-salt diet advised---Less than 2 gm of Sodium per day advised----ok to use Mrs DASH salt substitute instead of Salt  7)Weigh yourself daily, call if you gain more than 3 pounds in 1 day or more than 5 pounds in 1 week as your diuretic medications may need to be adjusted   Increase activity slowly   Complete by: As directed        Discharge Medications     Allergies as of 07/10/2023       Reactions   Sulfa Antibiotics Other (See Comments)   Unknown childhood reaction   Adhesive [tape] Other  (See Comments)   Contact dermatitis        Medication List     STOP taking these medications    furosemide 20 MG tablet Commonly known as: Lasix       TAKE these medications    acetaminophen 325 MG tablet Commonly known as: TYLENOL Take 2 tablets (650 mg total) by mouth every 6 (six) hours as needed for mild pain (pain score 1-3), fever or headache (or Fever >/= 101).   alfuzosin 10 MG 24 hr tablet Commonly known as: UROXATRAL TAKE ONE TABLET DAILY WITH BREAKFAST   apixaban 5 MG Tabs tablet Commonly known as: Eliquis Take 1 tablet (5 mg total) by mouth 2 (two) times daily.   calcium carbonate 1250 (500 Ca) MG tablet Commonly known as: OS-CAL - dosed in mg of elemental calcium Take 1 tablet (1,250 mg total) by mouth 2 (two) times daily with a meal.   cyanocobalamin 1000 MCG tablet Commonly known as: VITAMIN B12 Take 1 tablet (1,000 mcg total) by mouth daily.   ferrous sulfate 325 (65 FE) MG tablet Take 1 tablet (325 mg total) by mouth daily with breakfast.   fludrocortisone 0.1 MG tablet Commonly known as: FLORINEF Take 1 tablet (0.1 mg total) by mouth daily.   guaiFENesin 600 MG 12 hr tablet Commonly known as: Mucinex Take 1 tablet (600 mg total) by mouth 2 (two) times daily.   levothyroxine 100 MCG tablet Commonly known as: SYNTHROID Take 1 tablet (100 mcg total) by mouth daily before breakfast.   magnesium oxide 400 (240 Mg) MG tablet Commonly known as: MAG-OX Take 1 tablet (400 mg total) by mouth daily.   methocarbamol 500 MG tablet Commonly known as: ROBAXIN Take 1 tablet (500 mg total) by mouth every 6 (six) hours as needed for muscle spasms.   midodrine 10 MG tablet Commonly known as: PROAMATINE Take 1 tablet (10 mg total) by mouth 2 (two) times daily with a meal.   pantoprazole 40 MG tablet Commonly known as: Protonix Take 1 tablet (40 mg total) by mouth daily.   polyethylene glycol 17 g packet Commonly known as: MiraLax Take 17 g by  mouth daily.   Potassium Chloride ER 20 MEQ Tbcr Take 1 tablet (20 mEq total) by mouth daily. 1 tab daily by mouth---  take while taking Lasix/furosemide   predniSONE 10 MG tablet Commonly known as: DELTASONE Take 1 tablet (10 mg total) by mouth daily with breakfast.   pregabalin 200 MG capsule Commonly known as: LYRICA Take 1 capsule (200 mg total) by mouth 2 (two) times daily.   rosuvastatin 5 MG tablet Commonly known as: CRESTOR Take 1 tablet (5 mg total) by mouth at bedtime.   senna-docusate 8.6-50 MG tablet Commonly known as: Senokot-S Take 2 tablets by mouth at bedtime.   sodium chloride 0.65 % Soln nasal spray Commonly known as: OCEAN Place 2 sprays into both nostrils as needed for congestion.   torsemide 20 MG tablet Commonly known as: DEMADEX Take 1 tablet (20 mg total) by mouth daily. Start taking on: July 11, 2023   valACYclovir 1000 MG tablet Commonly known as: VALTREX Take 1 tablet (1,000 mg total) by mouth daily.   Vitamin D (Ergocalciferol) 1.25 MG (50000 UNIT) Caps capsule Commonly known as: DRISDOL Take 1 capsule (50,000 Units total) by mouth every 7 (seven) days.       Major procedures and Radiology Reports - PLEASE review detailed and final reports for all details, in brief -   ECHOCARDIOGRAM COMPLETE Result Date: 07/04/2023    ECHOCARDIOGRAM REPORT   Patient Name:   Amadu JAMIL ARMWOOD Date of Exam: 07/04/2023 Medical Rec #:  [id number]      Height:       72.0 in Accession #:    [id number]     Weight:       251.3 lb Date of Birth:  1950/11/26     BSA:          2.348 m Patient Age:    72 years       BP:           101/65 mmHg Patient Gender: M              HR:           63 bpm. [facility] Procedure: 2D Echo, Cardiac Doppler, Color Doppler and Intracardiac            Opacification Agent (Both Spectral and Color Flow Doppler were            utilized during procedure). Indications:    CHF-Acute Diastolic I50.31  History:        Patient has prior  history of Echocardiogram examinations, most                 recent 09/06/2022. CHF, Pacemaker, Arrythmias:Atrial Fibrillation,                 Bradycardia, LAFB (left anterior fascicular block) and RBBB;                 Risk Factors:Hypertension and Dyslipidemia.  Sonographer:    Celesta Gentile RCS Referring Phys: [phone] MICHAEL E NORINS  Sonographer Comments: Technically difficult study due to poor echo windows. Image acquisition challenging due to patient body habitus. IMPRESSIONS  1. Left ventricular ejection fraction, by estimation, is 55 to 60%. The left ventricle has normal function. The left ventricle demonstrates regional wall motion abnormalities (see scoring diagram/findings for description). There is mild concentric left ventricular hypertrophy. Left ventricular diastolic parameters are indeterminate.  2. Right ventricular systolic function is low normal. The right ventricular size is normal. There is normal pulmonary artery systolic pressure. The estimated right ventricular systolic pressure is 20.6 mmHg.  3. Left atrial size was mild to moderately dilated.  4. Right atrial size was mild to moderately dilated.  5. The mitral valve is degenerative. Trivial mitral valve regurgitation.  6. The aortic valve is tricuspid. There is moderate calcification of the aortic valve. Aortic valve regurgitation is trivial. Moderate aortic valve stenosis. Aortic valve mean gradient measures 24.0 mmHg. Dimentionless index 0.28.  7. Aortic dilatation noted. There is borderline dilatation of the aortic root, measuring 39 mm.  8. The inferior vena cava is normal in size with greater than 50% respiratory variability, suggesting right atrial pressure of 3 mmHg. Comparison(s): Prior images reviewed side by side. LVEF 55-60% with indeterminate diastolic function. Moderate aortic stenosis. FINDINGS  Left Ventricle: Left ventricular ejection fraction, by estimation, is 55 to 60%. The left ventricle has normal function. The left  ventricle demonstrates regional wall motion abnormalities. Definity contrast agent was given IV to delineate the left ventricular endocardial borders. Strain imaging was not performed. The left ventricular internal cavity size was normal in size. There is mild concentric left ventricular hypertrophy. Left ventricular diastolic parameters are indeterminate.  LV Wall Scoring: The basal inferior segment is hypokinetic. The entire anterior wall, entire lateral wall, entire septum, entire apex, and mid and distal inferior wall are normal. Right Ventricle: The right ventricular size is normal. No increase in right ventricular wall thickness. Right ventricular systolic function is low normal. There is normal pulmonary artery systolic pressure. The tricuspid regurgitant velocity is 2.10 m/s,  and with an assumed right atrial pressure of 3 mmHg, the estimated right ventricular systolic pressure is 20.6 mmHg. Left Atrium: Left atrial size was mild to moderately dilated. Right Atrium: Right atrial size was mild to moderately dilated. Pericardium: There is no evidence of pericardial effusion. Presence of epicardial fat layer. Mitral Valve: The mitral valve is degenerative in appearance. There is mild calcification of the mitral valve leaflet(s). Mild to moderate mitral annular calcification. Trivial mitral valve regurgitation. Tricuspid Valve: The tricuspid valve is grossly normal. Tricuspid valve regurgitation is trivial. Aortic Valve: The aortic valve is tricuspid. There is moderate calcification of the aortic valve. There is moderate aortic valve annular calcification. Aortic valve regurgitation is trivial. Moderate aortic stenosis is present. Aortic valve mean gradient  measures 24.0 mmHg. Aortic valve peak gradient measures 38.2 mmHg. Aortic valve area, by VTI measures 0.96 cm. Pulmonic Valve: The pulmonic valve was grossly normal. Pulmonic valve regurgitation is trivial. Aorta: Aortic dilatation noted. There is  borderline dilatation of the aortic root, measuring 39 mm. Venous: The inferior vena cava is normal in size with greater than 50% respiratory variability, suggesting right atrial pressure of 3 mmHg. IAS/Shunts: No atrial level shunt detected by color flow Doppler. Additional Comments: 3D imaging was not performed. A device lead is visualized.  LEFT VENTRICLE PLAX 2D LVIDd:         4.70 cm LVIDs:         4.00 cm LV PW:         1.20 cm LV IVS:        1.30 cm LVOT diam:     2.10 cm LV SV:         63 LV SV Index:   27 LVOT Area:     3.46 cm  RIGHT VENTRICLE RV S prime:     11.50 cm/s TAPSE (M-mode): 2.0 cm LEFT ATRIUM              Index        RIGHT ATRIUM  Index LA diam:        4.10 cm  1.75 cm/m   RA Area:     29.20 cm LA Vol (A2C):   125.0 ml 53.24 ml/m  RA Volume:   104.00 ml 44.30 ml/m LA Vol (A4C):   86.8 ml  36.95 ml/m LA Biplane Vol: 111.0 ml 47.28 ml/m  AORTIC VALVE AV Area (Vmax):    0.91 cm AV Area (Vmean):   0.90 cm AV Area (VTI):     0.96 cm AV Vmax:           309.00 cm/s AV Vmean:          228.000 cm/s AV VTI:            0.663 m AV Peak Grad:      38.2 mmHg AV Mean Grad:      24.0 mmHg LVOT Vmax:         81.50 cm/s LVOT Vmean:        59.400 cm/s LVOT VTI:          0.183 m LVOT/AV VTI ratio: 0.28  AORTA Ao Root diam: 3.90 cm MITRAL VALVE               TRICUSPID VALVE MV Area (PHT): 3.31 cm    TR Peak grad:   17.6 mmHg MV Decel Time: 229 msec    TR Vmax:        210.00 cm/s MV E velocity: 89.10 cm/s                            SHUNTS                            Systemic VTI:  0.18 m                            Systemic Diam: 2.10 cm Nona Dell MD Electronically signed by Nona Dell MD Signature Date/Time: 07/04/2023/3:36:42 PM    Final    DG Chest Portable 1 View Result Date: 07/02/2023 CLINICAL DATA:  Shortness of breath with fall at home. Hypoxemia. Recent hospitalization for pneumonia. EXAM: PORTABLE CHEST 1 VIEW COMPARISON:  Radiographs 06/28/2023 and 06/25/2023.  CT  06/25/2023. FINDINGS: 1740 hours. Right IJ Port-A-Cath extends to the level of the mid right atrium. Left subclavian pacemaker leads are grossly unchanged, projecting over the right atrium and right ventricle. Stable cardiomegaly and vascular congestion. No significant change in residual left basilar opacity and probable small left pleural effusion. No progressive airspace disease, edema or pneumothorax demonstrated. The bones appear unchanged. IMPRESSION: No significant change in residual left basilar opacity and probable small left pleural effusion. No progressive airspace disease or edema. Electronically Signed   By: Carey Bullocks M.D.   On: 07/02/2023 18:17   DG CHEST PORT 1 VIEW Result Date: 06/28/2023 CLINICAL DATA:  Pleural effusion.  Follow-up exam. EXAM: PORTABLE CHEST 1 VIEW COMPARISON:  06/25/2023 and older studies.  CT, 06/25/2023. FINDINGS: Cardiac silhouette top-normal in size. No mediastinal or hilar masses. Mild, left greater than right lung base opacities are similar to the most recent prior exam consistent with a combination of atelectasis and small effusions. Remainder of the lungs is clear. No pneumothorax. Stable right anterior chest wall Port-A-Cath and left anterior chest wall dual lead pacemaker. IMPRESSION: 1. No significant change from the most recent prior study. 2.  Persistent lung base opacities consistent with a combination of atelectasis and small effusions. Electronically Signed   By: Amie Portland M.D.   On: 06/28/2023 10:02   CUP PACEART REMOTE DEVICE CHECK Result Date: 06/25/2023 Scheduled remote reviewed. Normal device function.  Known permanent AF, on OAC, good ventricular rate control Next remote 91 days. ML, CVRS  CT Angio Chest PE W and/or Wo Contrast Result Date: 06/25/2023 CLINICAL DATA:  Hypoxia.  Recently status post spine surgery EXAM: CT ANGIOGRAPHY CHEST WITH CONTRAST TECHNIQUE: Multidetector CT imaging of the chest was performed using the standard protocol  during bolus administration of intravenous contrast. Multiplanar CT image reconstructions and MIPs were obtained to evaluate the vascular anatomy. RADIATION DOSE REDUCTION: This exam was performed according to the departmental dose-optimization program which includes automated exposure control, adjustment of the mA and/or kV according to patient size and/or use of iterative reconstruction technique. CONTRAST:  75mL OMNIPAQUE IOHEXOL 350 MG/ML SOLN COMPARISON:  Same day chest radiograph, thoracic spine MRI dated 05/28/2023 FINDINGS: Decreased sensitivity and specificity for detailed findings due to motion artifact. Cardiovascular: Right chest wall port tip terminates at the superior cavoatrial junction. Left chest wall pacemaker leads terminate in the right atrium and ventricle. The study is adequate for the evaluation of pulmonary embolism to the level of the proximal segmental arteries. There are no filling defects in the central, lobar, proximal segmental segmental pulmonary artery branches to suggest acute pulmonary embolism. Great vessels are normal in course and caliber. Multichamber cardiomegaly. No significant pericardial fluid/thickening. Coronary artery calcifications and aortic atherosclerosis. Mediastinum/Nodes: Imaged thyroid gland without nodules meeting criteria for imaging follow-up by size. Normal esophagus. No pathologically enlarged axillary, supraclavicular, mediastinal, or hilar lymph nodes. Lungs/Pleura: The central airways are patent. Subsegmental mucous plugging in the lingula and left lower lobe. Bilateral lower lobe relaxation atelectasis. No pneumothorax. Moderate bilateral pleural effusions, increased compared to 05/28/2023. Upper abdomen: Normal. Musculoskeletal: No acute or abnormal lytic or blastic osseous lesions. Postsurgical changes of T10-12. Small foci of gas within the T11-12 intervertebral disc space. Review of the MIP images confirms the above findings. IMPRESSION: 1. Decreased  sensitivity and specificity for detailed findings due to motion artifact. No evidence of pulmonary embolism to the level of the proximal segmental arteries. 2. Moderate bilateral pleural effusions, increased compared to 05/28/2023. 3. Multichamber cardiomegaly. 4. Gas within the T11-12 intervertebral disc space, which may be postsurgical. 5. Aortic Atherosclerosis (ICD10-I70.0). Coronary artery calcifications. Assessment for potential risk factor modification, dietary therapy or pharmacologic therapy may be warranted, if clinically indicated. Electronically Signed   By: Agustin Cree M.D.   On: 06/25/2023 14:56   DG Chest Port 1 View Result Date: 06/25/2023 CLINICAL DATA:  Hypoxia EXAM: PORTABLE CHEST 1 VIEW COMPARISON:  Chest radiograph 06/04/2023 FINDINGS: Port-A-Cath tip projects over the superior vena cava. Dual lead pacer apparatus overlies the left hemithorax. Stable cardiomegaly. Similar bilateral lower lung heterogeneous opacities. No definite large pleural effusion or pneumothorax. Thoracic spine degenerative changes. Spinal fusion hardware. IMPRESSION: Similar bilateral lower lung heterogeneous opacities which may represent atelectasis or infection. Electronically Signed   By: Annia Belt M.D.   On: 06/25/2023 12:17    Micro Results   Recent Results (from the past 240 hours)  Resp panel by RT-PCR (RSV, Flu A&B, Covid) Anterior Nasal Swab     Status: Abnormal   Collection Time: 07/02/23  5:18 PM   Specimen: Anterior Nasal Swab  Result Value Ref Range Status   SARS Coronavirus 2 by RT PCR NEGATIVE NEGATIVE  Final    Comment: (NOTE) SARS-CoV-2 target nucleic acids are NOT DETECTED.  The SARS-CoV-2 RNA is generally detectable in upper respiratory specimens during the acute phase of infection. The lowest concentration of SARS-CoV-2 viral copies this assay can detect is 138 copies/mL. A negative result does not preclude SARS-Cov-2 infection and should not be used as the sole basis for treatment  or other patient management decisions. A negative result may occur with  improper specimen collection/handling, submission of specimen other than nasopharyngeal swab, presence of viral mutation(s) within the areas targeted by this assay, and inadequate number of viral copies(<138 copies/mL). A negative result must be combined with clinical observations, patient history, and epidemiological information. The expected result is Negative.  Fact Sheet for Patients:  BloggerCourse.com  Fact Sheet for Healthcare Providers:  SeriousBroker.it  This test is no t yet approved or cleared by the [place] FDA and  has been authorized for detection and/or diagnosis of SARS-CoV-2 by FDA under an Emergency Use Authorization (EUA). This EUA will remain  in effect (meaning this test can be used) for the duration of the COVID-19 declaration under Section 564(b)(1) of the Act, 21 U.S.C.section 360bbb-3(b)(1), unless the authorization is terminated  or revoked sooner.       Influenza A by PCR POSITIVE (A) NEGATIVE Final   Influenza B by PCR NEGATIVE NEGATIVE Final    Comment: (NOTE) The Xpert Xpress SARS-CoV-2/FLU/RSV plus assay is intended as an aid in the diagnosis of influenza from Nasopharyngeal swab specimens and should not be used as a sole basis for treatment. Nasal washings and aspirates are unacceptable for Xpert Xpress SARS-CoV-2/FLU/RSV testing.  Fact Sheet for Patients: BloggerCourse.com  Fact Sheet for Healthcare Providers: SeriousBroker.it  This test is not yet approved or cleared by the [place] FDA and has been authorized for detection and/or diagnosis of SARS-CoV-2 by FDA under an Emergency Use Authorization (EUA). This EUA will remain in effect (meaning this test can be used) for the duration of the COVID-19 declaration under Section 564(b)(1) of the Act, 21  U.S.C. section 360bbb-3(b)(1), unless the authorization is terminated or revoked.     Resp Syncytial Virus by PCR NEGATIVE NEGATIVE Final    Comment: (NOTE) Fact Sheet for Patients: BloggerCourse.com  Fact Sheet for Healthcare Providers: SeriousBroker.it  This test is not yet approved or cleared by the [place] FDA and has been authorized for detection and/or diagnosis of SARS-CoV-2 by FDA under an Emergency Use Authorization (EUA). This EUA will remain in effect (meaning this test can be used) for the duration of the COVID-19 declaration under Section 564(b)(1) of the Act, 21 U.S.C. section 360bbb-3(b)(1), unless the authorization is terminated or revoked.  Performed at [facility], [address] [place], [place] [address]   Culture, blood (Routine x 2)     Status: None   Collection Time: 07/02/23  5:22 PM   Specimen: BLOOD LEFT HAND  Result Value Ref Range Status   Specimen Description BLOOD LEFT HAND  Final   Special Requests   Final    BOTTLES DRAWN AEROBIC AND ANAEROBIC Blood Culture adequate volume   Culture   Final    NO GROWTH 5 DAYS Performed at [facility], [address], [place], [place] [address]    Report Status 07/07/2023 FINAL  Final  Culture, blood (Routine x 2)     Status: None   Collection Time: 07/02/23  6:00 PM   Specimen: BLOOD RIGHT ARM  Result Value Ref Range Status   Specimen  Description   Final    BLOOD RIGHT ARM R UPPER ARM BOTTLES DRAWN AEROBIC AND ANAEROBIC   Special Requests Blood Culture adequate volume  Final   Culture   Final    NO GROWTH 5 DAYS Performed at [facility], [address], [place], [place] [address]    Report Status 07/07/2023 FINAL  Final  MRSA Next Gen by PCR, Nasal     Status: None   Collection Time: 07/03/23 12:06 PM   Specimen: Nasal Mucosa; Nasal Swab  Result Value Ref Range Status   MRSA by PCR Next Gen NOT DETECTED NOT DETECTED Final    Comment:  (NOTE) The GeneXpert MRSA Assay (FDA approved for NASAL specimens only), is one component of a comprehensive MRSA colonization surveillance program. It is not intended to diagnose MRSA infection nor to guide or monitor treatment for MRSA infections. Test performance is not FDA approved in patients less than 73 years old. Performed at [facility], [address], [place], [place] [address]     Today   Subjective    Aaric Dolph today has no new complaints  Talking to son Viviann Spare on the phone-- No fever  Or chills  Cough and dyspnea is improving   No Nausea, Vomiting or Diarrhea   Patient has been seen and examined prior to discharge   Objective   Blood pressure 121/75, pulse 72, temperature 98.6 F (37 C), temperature source Oral, resp. rate 19, height 6' (1.829 m), weight 103 kg, SpO2 94%.   Intake/Output Summary (Last 24 hours) at 07/10/2023 1501 Last data filed at 07/10/2023 1428 Gross per 24 hour  Intake 240 ml  Output 1125 ml  Net -885 ml    Exam Gen:- Awake Alert, no acute distress  HEENT:- [place].AT, No sclera icterus Nose- [place] 2L/min Neck-Supple Neck,No JVD,.  Lungs-improved air movement, no wheezing  CV- S1, S2 normal, regular Abd-  +ve B.Sounds, Abd Soft, No tenderness,    Extremity/Skin:-Improved edema,   good pulses Psych-affect is appropriate, oriented x3 Neuro--generalized weakness, no new focal deficits, no tremors    Data Review   CBC w Diff:  Lab Results  Component Value Date   WBC 8.9 07/04/2023   HGB 14.1 07/04/2023   HCT 47.7 07/04/2023   PLT 186 07/04/2023   LYMPHOPCT 10 07/03/2023   BANDSPCT 3 09/27/2021   MONOPCT 5 07/03/2023   EOSPCT 0 07/03/2023   BASOPCT 0 07/03/2023   CMP:  Lab Results  Component Value Date   NA 140 07/10/2023   NA 143 10/04/2022   K 3.0 (L) 07/10/2023   CL 87 (L) 07/10/2023   CO2 45 (H) 07/10/2023   BUN 20 07/10/2023   BUN 11 10/04/2022   CREATININE 0.57 (L) 07/10/2023   CREATININE 0.82 11/11/2020   PROT  5.9 (L) 07/03/2023   PROT 6.1 10/04/2022   ALBUMIN 3.3 (L) 07/03/2023   ALBUMIN 4.1 10/04/2022   BILITOT 1.6 (H) 07/03/2023   BILITOT 0.9 10/04/2022   BILITOT 2.3 (H) 11/11/2020   ALKPHOS 134 (H) 07/03/2023   AST 25 07/03/2023   AST 74 (H) 11/11/2020   ALT 22 07/03/2023   ALT 21 11/11/2020   Total Discharge time is about 33 minutes  Shon Hale M.D on 07/10/2023 at 3:01 PM  Go to [email or web address] -  for contact info  [facility] - Office  [phone]

## 2023-07-10 NOTE — Plan of Care (Signed)
   Problem: Education: Goal: Knowledge of General Education information will improve Description: Including pain rating scale, medication(s)/side effects and non-pharmacologic comfort measures Outcome: Completed/Met   Problem: Health Behavior/Discharge Planning: Goal: Ability to manage health-related needs will improve Outcome: Completed/Met   Problem: Clinical Measurements: Goal: Ability to maintain clinical measurements within normal limits will improve Outcome: Completed/Met Goal: Will remain free from infection Outcome: Completed/Met Goal: Diagnostic test results will improve Outcome: Completed/Met Goal: Respiratory complications will improve Outcome: Completed/Met Goal: Cardiovascular complication will be avoided Outcome: Completed/Met   Problem: Activity: Goal: Risk for activity intolerance will decrease Outcome: Completed/Met   Problem: Nutrition: Goal: Adequate nutrition will be maintained Outcome: Completed/Met   Problem: Coping: Goal: Level of anxiety will decrease Outcome: Completed/Met   Problem: Elimination: Goal: Will not experience complications related to bowel motility Outcome: Completed/Met Goal: Will not experience complications related to urinary retention Outcome: Completed/Met   Problem: Pain Managment: Goal: General experience of comfort will improve and/or be controlled Outcome: Completed/Met   Problem: Safety: Goal: Ability to remain free from injury will improve Outcome: Completed/Met   Problem: Skin Integrity: Goal: Risk for impaired skin integrity will decrease Outcome: Completed/Met   Problem: Activity: Goal: Ability to tolerate increased activity will improve Outcome: Completed/Met   Problem: Clinical Measurements: Goal: Ability to maintain a body temperature in the normal range will improve Outcome: Completed/Met   Problem: Respiratory: Goal: Ability to maintain adequate ventilation will improve Outcome: Completed/Met Goal:  Ability to maintain a clear airway will improve Outcome: Completed/Met

## 2023-07-10 NOTE — Discharge Instructions (Addendum)
 1)You are taking Eliquis/Apixaban which is a blood thinner----so Please Avoid ibuprofen/Advil/Aleve/Motrin/Goody Powders/Naproxen/BC powders/Meloxicam/Diclofenac/Indomethacin and other Nonsteroidal anti-inflammatory medications as these will make you more likely to bleed and can cause stomach ulcers, can also cause Kidney problems.  2)Repeat BMP and CBC Blood Test around Monday on 07/15/2023  3)You need oxygen at home at 2 L via nasal cannula continuously while awake and while asleep--- smoking or having open fires around oxygen can cause fire, significant injury and death  4)Please use CPAP/BiPAP at bedtime and prn sleep/naps  5)Outpatient follow-up with cardiologist--due to wall motion abnormalities on echocardiogram (-Echo with EF of 55 to 60%, mild concentric LVH noted, moderate aortic Valve stenosis noted , left ventricular wall motion abnormalities noted, (The basal inferior segment is hypokinetic. The entire anterior wall, entire lateral wall, entire septum, entire apex, and mid and distal inferior wall are normal)  6)Very Low-salt diet advised---Less than 2 gm of Sodium per day advised----ok to use Mrs DASH salt substitute instead of Salt  7)Weigh yourself daily, call if you gain more than 3 pounds in 1 day or more than 5 pounds in 1 week as your diuretic medications may need to be adjusted

## 2023-07-10 NOTE — Evaluation (Signed)
 Occupational Therapy Evaluation Patient Details Name: Roy [name] MRN: [id number] DOB: 1950/10/17 Today's Date: 07/10/2023   History of Present Illness   Roy [name], a 73 y.o. year old male with medical history of atrial fibrillation maintained on Eliquis, adrenal insufficiency, diffuse large B-cell lymphoma, GERD, high cholesterol, hypertension, hospitalization Jan 2025 for posterior spinal fusion with a complicated course and recent admission 2/18-2/22/25 for acute hypoxic respiratory failure because of CHF.Per patient record he was discharged from the hospital 06/29/23 to SNF for rehab.  He has been taking his medication as prescribed. Today he feels weaker than usual.  At rehab He was being walked to the chair by his family, but his legs just gave out.  He was having profound shortness of breath.  He has had shortness of breath with exertion since discharge, unable to walk in his house. EMS activated and on arrival EMT report O2 sats in the 70's. He was transported to [facility] for evaluation.     Clinical Impressions Pt agreeable to OT evaluation. Pt removed from 2L supplemental O2 but desaturated upon sitting up in bed. Pt returned to 2 LPM with O2 comments noted in transfer section. Pt required CGA for bed mobility with HOB raised and CGA to min A for transfers with RW. Max A for peri-care after a bowel movement. Good B UE strength and functional use. Pt left in the chair with call bell within reach and nursing notified. Pt will benefit from continued OT in the hospital and recommended venue below to increase strength, balance, and endurance for safe ADL's.        If plan is discharge home, recommend the following:   A little help with walking and/or transfers;A lot of help with bathing/dressing/bathroom;Assistance with cooking/housework;Assist for transportation;Help with stairs or ramp for entrance     Functional Status Assessment   Patient has had a recent decline in their  functional status and demonstrates the ability to make significant improvements in function in a reasonable and predictable amount of time.     Equipment Recommendations   None recommended by OT             Precautions/Restrictions   Precautions Precautions: Fall Recall of Precautions/Restrictions: Intact Restrictions Weight Bearing Restrictions Per Provider Order: No     Mobility Bed Mobility Overal bed mobility: Needs Assistance Bed Mobility: Supine to Sit     Supine to sit: HOB elevated, Contact guard     General bed mobility comments: labored effort    Transfers Overall transfer level: Needs assistance Equipment used: Rolling walker (2 wheels) Transfers: Sit to/from Stand, Bed to chair/wheelchair/BSC Sit to Stand: Min assist     Step pivot transfers: Contact guard assist, Min assist     General transfer comment: unsteady movement; desatruation noted during transfers to a low of ~76% at one point while on 2LPM. Returned to ~96% SpO2 by the end of session.      Balance Overall balance assessment: Needs assistance Sitting-balance support: Feet supported, No upper extremity supported Sitting balance-Leahy Scale: Good Sitting balance - Comments: seated at EOB   Standing balance support: Bilateral upper extremity supported, During functional activity, Reliant on assistive device for balance Standing balance-Leahy Scale: Fair Standing balance comment: using RW                           ADL either performed or assessed with clinical judgement   ADL Overall ADL's : Needs assistance/impaired  Grooming: Set up;Sitting   Upper Body Bathing: Set up;Sitting   Lower Body Bathing: Moderate assistance;Maximal assistance;Sitting/lateral leans   Upper Body Dressing : Set up;Sitting   Lower Body Dressing: Maximal assistance;Moderate assistance;Sitting/lateral leans   Toilet Transfer: Minimal assistance;Ambulation;Rolling walker (2  wheels);BSC/3in1 Toilet Transfer Details (indicate cue type and reason): EOB to [facility] with RW Toileting- Clothing Manipulation and Hygiene: Maximal assistance;Sit to/from stand Toileting - Clothing Manipulation Details (indicate cue type and reason): assisted for peri-care after bowel movement     Functional mobility during ADLs: Minimal assistance;Contact guard assist;Rolling walker (2 wheels)       Vision Baseline Vision/History: 1 Wears glasses Ability to See in Adequate Light: 1 Impaired Patient Visual Report: No change from baseline Vision Assessment?: Wears glasses for reading     Perception Perception: Not tested       Praxis Praxis: Not tested       Pertinent Vitals/Pain Pain Assessment Pain Assessment: Faces Faces Pain Scale: No hurt     Extremity/Trunk Assessment Upper Extremity Assessment Upper Extremity Assessment: Overall WFL for tasks assessed   Lower Extremity Assessment Lower Extremity Assessment: Defer to PT evaluation   Cervical / Trunk Assessment Cervical / Trunk Assessment: Kyphotic;Back Surgery   Communication Communication Communication: No apparent difficulties   Cognition Arousal: Alert Behavior During Therapy: WFL for tasks assessed/performed Cognition: No apparent impairments                               Following commands: Intact       Cueing  General Comments   Cueing Techniques: Verbal cues                 Home Living Family/patient expects to be discharged to:: Private residence Living Arrangements: Alone Available Help at Discharge: Neighbor;Available PRN/intermittently Type of Home: House Home Access: Stairs to enter [employer] of Steps: 3 Entrance Stairs-Rails: Left;Right;Can reach both Home Layout: One level     Bathroom Shower/Tub: Producer, television/film/video: Standard Bathroom Accessibility: Yes   Home Equipment: Agricultural consultant (2 wheels);Cane - single point;BSC/3in1    Additional Comments: per chairt; pt came from SNF      Prior Functioning/Environment Prior Level of Function : Needs assist       Physical Assist : ADLs (physical)   ADLs (physical): Bathing;Dressing;Toileting;IADLs Mobility Comments: RW at baseline. SPC available. ADLs Comments: Independent prior but needing assit for bathing, dressing, and toileting at SNF    OT Problem List: Decreased activity tolerance;Impaired balance (sitting and/or standing);Cardiopulmonary status limiting activity   OT Treatment/Interventions: Self-care/ADL training;Therapeutic exercise;DME and/or AE instruction;Energy conservation;Therapeutic activities;Patient/family education;Balance training      OT Goals(Current goals can be found in the care plan section)   Acute Rehab OT Goals Patient Stated Goal: improve function OT Goal Formulation: With patient Time For Goal Achievement: 07/24/23 Potential to Achieve Goals: Good   OT Frequency:  Min 2X/week                                   End of Session Equipment Utilized During Treatment: Rolling walker (2 wheels) Nurse Communication: Other (comment) (notified pt was in the chair)  Activity Tolerance: Patient tolerated treatment well Patient left: in chair;with call bell/phone within reach  OT Visit Diagnosis: Unsteadiness on feet (R26.81);Other abnormalities of gait and mobility (R26.89);Muscle weakness (generalized) (M62.81)  Time: 4098-1191 OT Time Calculation (min): 28 min Charges:  OT General Charges $OT Visit: 1 Visit OT Evaluation $OT Eval Low Complexity: 1 Low  Emalyn Schou OT, MOT   Danie Chandler 07/10/2023, 9:48 AM

## 2023-07-10 NOTE — Progress Notes (Signed)
 Called report to CV and spoke with [place], LPN.

## 2023-07-10 NOTE — Care Management Important Message (Signed)
 Important Message  Patient Details  Name: Roy [name] MRN: [id number] Date of Birth: 1951-02-28   Important Message Given:  Yes - Medicare IM     Corey Harold 07/10/2023, 11:25 AM

## 2023-07-10 NOTE — Plan of Care (Signed)
  Problem: Acute Rehab OT Goals (only OT should resolve) Goal: Pt. Will Perform Grooming Flowsheets (Taken 07/10/2023 0950) Pt Will Perform Grooming: with modified independence Goal: Pt. Will Perform Upper Body Dressing Flowsheets (Taken 07/10/2023 0950) Pt Will Perform Upper Body Dressing: with modified independence Goal: Pt. Will Perform Lower Body Dressing Flowsheets (Taken 07/10/2023 0950) Pt Will Perform Lower Body Dressing: with modified independence Goal: Pt. Will Transfer To Toilet Flowsheets (Taken 07/10/2023 0950) Pt Will Transfer to Toilet: with modified independence Goal: Pt. Will Perform Toileting-Clothing Manipulation Flowsheets (Taken 07/10/2023 0950) Pt Will Perform Toileting - Clothing Manipulation and hygiene: with modified independence  Demetreus Lothamer OT, MOT

## 2023-07-12 ENCOUNTER — Emergency Department (HOSPITAL_COMMUNITY)
Admission: EM | Admit: 2023-07-12 | Discharge: 2023-07-13 | Disposition: A | Discharge status: Home / Self Care | Attending: Emergency Medicine | Admitting: Emergency Medicine

## 2023-07-12 ENCOUNTER — Emergency Department (HOSPITAL_COMMUNITY)

## 2023-07-12 ENCOUNTER — Other Ambulatory Visit: Payer: Self-pay

## 2023-07-12 DIAGNOSIS — J90 Pleural effusion, not elsewhere classified: Secondary | ICD-10-CM | POA: Diagnosis not present

## 2023-07-12 DIAGNOSIS — Z7901 Long term (current) use of anticoagulants: Secondary | ICD-10-CM | POA: Insufficient documentation

## 2023-07-12 DIAGNOSIS — R531 Weakness: Secondary | ICD-10-CM | POA: Diagnosis present

## 2023-07-12 DIAGNOSIS — E876 Hypokalemia: Secondary | ICD-10-CM | POA: Diagnosis not present

## 2023-07-12 DIAGNOSIS — R0602 Shortness of breath: Secondary | ICD-10-CM | POA: Diagnosis present

## 2023-07-12 DIAGNOSIS — R17 Unspecified jaundice: Secondary | ICD-10-CM | POA: Insufficient documentation

## 2023-07-12 LAB — URINALYSIS, W/ REFLEX TO CULTURE (INFECTION SUSPECTED)
Bacteria, UA: NONE SEEN
Bilirubin Urine: NEGATIVE
Glucose, UA: NEGATIVE mg/dL
Hgb urine dipstick: NEGATIVE
Ketones, ur: NEGATIVE mg/dL
Leukocytes,Ua: NEGATIVE
Nitrite: NEGATIVE
Protein, ur: NEGATIVE mg/dL
Specific Gravity, Urine: 1.009 (ref 1.005–1.030)
pH: 8 (ref 5.0–8.0)

## 2023-07-12 LAB — CBC WITH DIFFERENTIAL/PLATELET
Abs Immature Granulocytes: 0.06 10*3/uL (ref 0.00–0.07)
Basophils Absolute: 0 10*3/uL (ref 0.0–0.1)
Basophils Relative: 0 %
Eosinophils Absolute: 0.1 10*3/uL (ref 0.0–0.5)
Eosinophils Relative: 0 %
HCT: 49 % (ref 39.0–52.0)
Hemoglobin: 15.7 g/dL (ref 13.0–17.0)
Immature Granulocytes: 1 %
Lymphocytes Relative: 5 %
Lymphs Abs: 0.7 10*3/uL (ref 0.7–4.0)
MCH: 30.6 pg (ref 26.0–34.0)
MCHC: 32 g/dL (ref 30.0–36.0)
MCV: 95.5 fL (ref 80.0–100.0)
Monocytes Absolute: 0.6 10*3/uL (ref 0.1–1.0)
Monocytes Relative: 5 %
Neutro Abs: 11.6 10*3/uL — ABNORMAL HIGH (ref 1.7–7.7)
Neutrophils Relative %: 89 %
Platelets: 202 10*3/uL (ref 150–400)
RBC: 5.13 MIL/uL (ref 4.22–5.81)
RDW: 15.1 % (ref 11.5–15.5)
WBC: 13.1 10*3/uL — ABNORMAL HIGH (ref 4.0–10.5)
nRBC: 0 % (ref 0.0–0.2)

## 2023-07-12 LAB — LACTIC ACID, PLASMA: Lactic Acid, Venous: 1.5 mmol/L (ref 0.5–1.9)

## 2023-07-12 LAB — COMPREHENSIVE METABOLIC PANEL
ALT: 55 U/L — ABNORMAL HIGH (ref 0–44)
AST: 50 U/L — ABNORMAL HIGH (ref 15–41)
Albumin: 3.3 g/dL — ABNORMAL LOW (ref 3.5–5.0)
Alkaline Phosphatase: 124 U/L (ref 38–126)
Anion gap: 13 (ref 5–15)
BUN: 17 mg/dL (ref 8–23)
CO2: 43 mmol/L — ABNORMAL HIGH (ref 22–32)
Calcium: 9.3 mg/dL (ref 8.9–10.3)
Chloride: 83 mmol/L — ABNORMAL LOW (ref 98–111)
Creatinine, Ser: 0.6 mg/dL — ABNORMAL LOW (ref 0.61–1.24)
GFR, Estimated: >60 mL/min (ref >60)
Glucose, Bld: 110 mg/dL — ABNORMAL HIGH (ref 70–99)
Potassium: 2.5 mmol/L — CL (ref 3.5–5.1)
Sodium: 139 mmol/L (ref 135–145)
Total Bilirubin: 4.5 mg/dL — ABNORMAL HIGH (ref 0.0–1.2)
Total Protein: 6.3 g/dL — ABNORMAL LOW (ref 6.5–8.1)

## 2023-07-12 LAB — MAGNESIUM: Magnesium: 2 mg/dL (ref 1.7–2.4)

## 2023-07-12 LAB — TROPONIN I (HIGH SENSITIVITY)
Troponin I (High Sensitivity): 45 ng/L — ABNORMAL HIGH (ref <18)
Troponin I (High Sensitivity): 38 ng/L — ABNORMAL HIGH (ref <18)

## 2023-07-12 LAB — BRAIN NATRIURETIC PEPTIDE: B Natriuretic Peptide: 104 pg/mL — ABNORMAL HIGH (ref 0.0–100.0)

## 2023-07-12 MED ORDER — IOHEXOL 350 MG/ML SOLN
100.0000 mL | Freq: Once | INTRAVENOUS | Status: AC | PRN
  Administered 2023-07-12: 100 mL via INTRAVENOUS

## 2023-07-12 MED ORDER — SILVER NITRATE-POT NITRATE 75-25 % EX MISC
1.0000 | Freq: Once | CUTANEOUS | Status: AC
  Administered 2023-07-12: 1 via TOPICAL
  Filled 2023-07-12: qty 10

## 2023-07-12 MED ORDER — POTASSIUM CHLORIDE 10 MEQ/100ML IV SOLN
10.0000 meq | Freq: Once | INTRAVENOUS | Status: AC
  Administered 2023-07-12: 10 meq via INTRAVENOUS
  Filled 2023-07-12: qty 100

## 2023-07-12 MED ORDER — OXYMETAZOLINE HCL 0.05 % NA SOLN
1.0000 | Freq: Once | NASAL | Status: AC
  Administered 2023-07-12: 1 via NASAL
  Filled 2023-07-12: qty 30

## 2023-07-12 MED ORDER — LIDOCAINE VISCOUS HCL 2 % MT SOLN
15.0000 mL | Freq: Once | OROMUCOSAL | Status: AC
  Administered 2023-07-12: 15 mL via OROMUCOSAL
  Filled 2023-07-12: qty 15

## 2023-07-12 MED ORDER — LEVOFLOXACIN 500 MG PO TABS: 500.0000 mg | ORAL_TABLET | Freq: Every day | ORAL | 0 refills | Status: DC

## 2023-07-12 MED ORDER — POTASSIUM CHLORIDE ER 10 MEQ PO TBCR: 10.0000 meq | EXTENDED_RELEASE_TABLET | Freq: Every day | ORAL | 0 refills | Status: DC

## 2023-07-12 MED ORDER — SODIUM CHLORIDE 0.9 % IV BOLUS
500.0000 mL | Freq: Once | INTRAVENOUS | Status: AC
  Administered 2023-07-12: 500 mL via INTRAVENOUS

## 2023-07-12 MED ORDER — POTASSIUM CHLORIDE 20 MEQ PO PACK
60.0000 meq | PACK | Freq: Once | ORAL | Status: AC
  Administered 2023-07-12: 60 meq via ORAL
  Filled 2023-07-12: qty 3

## 2023-07-12 MED ORDER — LEVOFLOXACIN 500 MG PO TABS
500.0000 mg | ORAL_TABLET | Freq: Once | ORAL | Status: AC
  Administered 2023-07-12: 500 mg via ORAL
  Filled 2023-07-12: qty 1

## 2023-07-12 NOTE — ED Notes (Signed)
 Patient used urinal with no problem urine sample gotten.

## 2023-07-12 NOTE — ED Triage Notes (Signed)
 Pt arrived via [facility] from [facility], was discharged from the ICU here at [facility] 2 days ago, was admitted for the flu, c/o generalized weakness since getting out of hospital and cough that has lasted since he has been Dx with the flu, Per EMS, pts SpO2 on RA was 79%, placed on 2 L via [place] and SpO2 went back up to 96%. Now c/o urinary retention x 2 days, per ems urine is dark and onlt urinated a little amount in the past two days

## 2023-07-12 NOTE — ED Notes (Addendum)
 Transition of Care [facility]) - Emergency Department Mini Assessment   Patient Details  Name: Roy [name] MRN: [id number] Date of Birth: [birth year]  Transition of Care [facility]) CM/SW Contact:    Roy [name] Phone Number: 07/12/2023, 3:35 PM   Clinical Narrative:  CSW spoke with patient son Roy [name] and completed a ED mini. Patient was just here at the hospital 2-days ago and [facility] to [facility]. Son was at bedside earlier and shared that his dad does not want to return back to [facility]. Son did share that his dad sheets were soiled when he went to [facility], but that was all he had knew. CSW called Roy [name] with [facility] and shared with her that pt does not want to return, Roy [name] asked why since they have everything there for him and then said that after her meeting she would come talk to pt to convince him to come back.    Son updated , will follow to see after conversation if patient will return only for ST.    ED Mini Assessment: What brought you to the Emergency Department? : EMS  Barriers to Discharge:  (ED- Patient does not want to return back to [facility])  Barrier interventions: Roy [name] with [facility] is coming to visit patient to see why he does not want to return- CSW will see if PT can assess pt again for insurance to approve another facility; if pt still refuse to  return back to [facility] after conversation  Means of departure: Ambulance  Interventions which prevented an admission or readmission: SNF Placement    Patient Contact and Communications Key Contact 1: Roy [name]   Spoke with: Son Contact Date: 07/12/23,   Contact time: 1525 Contact Phone Number: [phone] Call outcome: reason for another placment  Patient states their goals for this hospitalization and ongoing recovery are:: another facility CMS Medicare.gov Compare Post Acute Care list provided to:: Patient Represenative (must comment) (Brogan-son) Choice offered to / list presented to : Adult Children  Admission diagnosis:  EMS  Urinary Retension Patient Active Problem List   Diagnosis Date Noted   CAP (community acquired pneumonia) due to influenza A virus 07/02/2023   CAP (community acquired pneumonia) 07/02/2023   Acute hypoxic respiratory failure (HCC) 06/25/2023   Acute on chronic heart failure with preserved ejection fraction (HFpEF) (HCC) 06/25/2023   GI bleed 06/25/2023   Benign hypertension with coincident congestive heart failure (HCC) 06/10/2023   Chronic constipation 06/10/2023   Vitamin B 12 deficiency 06/10/2023   Hypomagnesuria 06/10/2023   Peripheral neuropathy 06/10/2023   Aortic atherosclerosis (HCC) 06/10/2023   T11 vertebral fracture (HCC) 05/24/2023   Urge incontinence 11/05/2022   Symptomatic bradycardia 09/05/2022   Acute congestive heart failure (HCC) 09/05/2022   Parotid neoplasm 12/28/2021   Mononeuropathy of left lower limb 12/28/2021   Neutropenic fever (HCC) 07/14/2021   Hypothyroidism 07/14/2021   Hypotension 07/14/2021   Hyperlipidemia 07/14/2021   BPH (benign prostatic hyperplasia) 07/14/2021   GERD (gastroesophageal reflux disease) 07/14/2021   Leukopenia    Thrombocytopenia (HCC)    Large B-cell lymphoma (HCC) 03/22/2021   Varicose veins of bilateral lower extremities with pain 02/02/2021   Venous (peripheral) insufficiency 02/02/2021   Subacute thyroiditis 01/20/2021   Adrenal insufficiency (HCC) 01/10/2021   Hyponatremia 01/08/2021   Weakness 01/08/2021   Nonrheumatic aortic valve stenosis 12/20/2020   Hyperthyroidism 10/22/2020   Coagulopathy (HCC) 10/21/2020   Obesity (BMI 30-39.9) 05/04/2015   Chronic anticoagulation 05/04/2015   Trifascicular block 04/29/2015  LAFB (left anterior fascicular block) 04/29/2015   Essential hypertension    Hypokalemia 04/28/2015   RBBB 04/28/2015   First degree AV block 04/28/2015   Atrial fibrillation (HCC) 04/27/2015   PCP:  Rebecka Apley, NP Pharmacy:   [facility] [place], [place] - [address] [place] [address] Phone: [phone] Fax: [phone]

## 2023-07-12 NOTE — ED Provider Notes (Addendum)
 [place] EMERGENCY DEPARTMENT AT [facility] Provider Note   CSN: [id number] Arrival date & time: 07/12/23  1033     History  Chief Complaint  Patient presents with   Urinary Retention    Roy [name] is a 73 y.o. male.  Patient is a 73 year old male who presents to emergency department the chief complaint of worsening weakness and shortness of breath since this morning.  Patient also notes he has been experiencing some abdominal pain.  He was recently discharged from the hospital 2 days ago and has been at a skilled nursing facility.  Son does accompany him and notes that he did find the patient sitting in urine this morning.  Patient has had no associated fever or chills.  He did have a nosebleed on arrival.  He denies any associated dizziness, lightheadedness or syncope.  He denies any increased edema.        Home Medications Prior to Admission medications   Medication Sig Start Date End Date Taking? Authorizing Provider  acetaminophen (TYLENOL) 325 MG tablet Take 2 tablets (650 mg total) by mouth every 6 (six) hours as needed for mild pain (pain score 1-3), fever or headache (or Fever >/= 101). 06/29/23   Emokpae, Courage, MD  alfuzosin (UROXATRAL) 10 MG 24 hr tablet TAKE ONE TABLET DAILY WITH BREAKFAST 06/24/23   Sharee Holster, NP  apixaban (ELIQUIS) 5 MG TABS tablet Take 1 tablet (5 mg total) by mouth 2 (two) times daily. 06/24/23   Sharee Holster, NP  calcium carbonate (OS-CAL - DOSED IN MG OF ELEMENTAL CALCIUM) 1250 (500 Ca) MG tablet Take 1 tablet (1,250 mg total) by mouth 2 (two) times daily with a meal. 06/06/23   Osvaldo Shipper, MD  ferrous sulfate 325 (65 FE) MG tablet Take 1 tablet (325 mg total) by mouth daily with breakfast. 06/24/23   Sharee Holster, NP  fludrocortisone (FLORINEF) 0.1 MG tablet Take 1 tablet (0.1 mg total) by mouth daily. 06/29/23   Shon Hale, MD  guaiFENesin (MUCINEX) 600 MG 12 hr tablet Take 1 tablet (600 mg total) by mouth 2  (two) times daily. 07/10/23 07/09/24  Shon Hale, MD  levothyroxine (SYNTHROID) 100 MCG tablet Take 1 tablet (100 mcg total) by mouth daily before breakfast. 06/24/23   Sharee Holster, NP  magnesium oxide (MAG-OX) 400 (240 Mg) MG tablet Take 1 tablet (400 mg total) by mouth daily. 06/24/23   Sharee Holster, NP  methocarbamol (ROBAXIN) 500 MG tablet Take 1 tablet (500 mg total) by mouth every 6 (six) hours as needed for muscle spasms. 06/24/23   Sharee Holster, NP  midodrine (PROAMATINE) 10 MG tablet Take 1 tablet (10 mg total) by mouth 2 (two) times daily with a meal. 06/29/23   Emokpae, Courage, MD  pantoprazole (PROTONIX) 40 MG tablet Take 1 tablet (40 mg total) by mouth daily. 06/29/23 06/28/24  Shon Hale, MD  polyethylene glycol (MIRALAX) 17 g packet Take 17 g by mouth daily. 06/29/23   Shon Hale, MD  Potassium Chloride ER 20 MEQ TBCR Take 1 tablet (20 mEq total) by mouth daily. 1 tab daily by mouth--- take while taking Lasix/furosemide 07/10/23   Shon Hale, MD  predniSONE (DELTASONE) 10 MG tablet Take 1 tablet (10 mg total) by mouth daily with breakfast. 06/24/23   Sharee Holster, NP  pregabalin (LYRICA) 200 MG capsule Take 1 capsule (200 mg total) by mouth 2 (two) times daily. 07/10/23   Shon Hale, MD  rosuvastatin (  CRESTOR) 5 MG tablet Take 1 tablet (5 mg total) by mouth at bedtime. 06/24/23   Sharee Holster, NP  senna-docusate (SENOKOT-S) 8.6-50 MG tablet Take 2 tablets by mouth at bedtime. 06/29/23   Shon Hale, MD  sodium chloride (OCEAN) 0.65 % SOLN nasal spray Place 2 sprays into both nostrils as needed for congestion. 06/29/23   Shon Hale, MD  torsemide (DEMADEX) 20 MG tablet Take 1 tablet (20 mg total) by mouth daily. 07/11/23   Shon Hale, MD  valACYclovir (VALTREX) 1000 MG tablet Take 1 tablet (1,000 mg total) by mouth daily. 06/24/23   Sharee Holster, NP  vitamin B-12 (CYANOCOBALAMIN) 1000 MCG tablet Take 1 tablet (1,000 mcg total) by mouth  daily. 02/28/21   Carnella Guadalajara, PA-C  Vitamin D, Ergocalciferol, (DRISDOL) 1.25 MG (50000 UNIT) CAPS capsule Take 1 capsule (50,000 Units total) by mouth every 7 (seven) days. 06/24/23   Sharee Holster, NP      Allergies    Sulfa antibiotics and Adhesive [tape]    Review of Systems   Review of Systems  Constitutional:  Positive for fatigue.  Respiratory:  Positive for shortness of breath.   Neurological:  Positive for weakness.  All other systems reviewed and are negative.   Physical Exam Updated Vital Signs BP 127/83   Pulse 73   Temp 98.2 F (36.8 C) (Oral)   Resp 20   SpO2 96%  Physical Exam Vitals and nursing note reviewed.  Constitutional:      Appearance: Normal appearance.  HENT:     Head: Normocephalic and atraumatic.     Nose: Nose normal.     Comments: Bleeding noted from left nare    Mouth/Throat:     Mouth: Mucous membranes are moist.  Eyes:     Extraocular Movements: Extraocular movements intact.     Conjunctiva/sclera: Conjunctivae normal.     Pupils: Pupils are equal, round, and reactive to light.  Cardiovascular:     Rate and Rhythm: Normal rate and regular rhythm.     Pulses: Normal pulses.     Heart sounds: Normal heart sounds. No murmur heard. Pulmonary:     Effort: Pulmonary effort is normal.     Breath sounds: Normal breath sounds.     Comments: Diffuse Rales Abdominal:     General: Abdomen is flat. Bowel sounds are normal. There is no distension.     Palpations: Abdomen is soft. There is no mass.     Tenderness: There is no guarding or rebound.     Hernia: No hernia is present.     Comments: Mild diffuse tenderness  Musculoskeletal:        General: Normal range of motion.     Cervical back: Normal range of motion and neck supple.     Right lower leg: Edema present.     Left lower leg: Edema present.  Skin:    General: Skin is warm and dry.     Coloration: Skin is not jaundiced.     Findings: No rash.  Neurological:      General: No focal deficit present.     Mental Status: He is alert and oriented to person, place, and time. Mental status is at baseline.  Psychiatric:        Mood and Affect: Mood normal.        Behavior: Behavior normal.        Thought Content: Thought content normal.        Judgment: Judgment  normal.     ED Results / Procedures / Treatments   Labs (all labs ordered are listed, but only abnormal results are displayed) Labs Reviewed  COMPREHENSIVE METABOLIC PANEL - Abnormal; Notable for the following components:      Result Value   Potassium 2.5 (*)    Chloride 83 (*)    CO2 43 (*)    Glucose, Bld 110 (*)    Creatinine, Ser 0.60 (*)    Total Protein 6.3 (*)    Albumin 3.3 (*)    AST 50 (*)    ALT 55 (*)    Total Bilirubin 4.5 (*)    All other components within normal limits  CBC WITH DIFFERENTIAL/PLATELET - Abnormal; Notable for the following components:   WBC 13.1 (*)    Neutro Abs 11.6 (*)    All other components within normal limits  BRAIN NATRIURETIC PEPTIDE - Abnormal; Notable for the following components:   B Natriuretic Peptide 104.0 (*)    All other components within normal limits  TROPONIN I (HIGH SENSITIVITY) - Abnormal; Notable for the following components:   Troponin I (High Sensitivity) 45 (*)    All other components within normal limits  CULTURE, BLOOD (ROUTINE X 2)  CULTURE, BLOOD (ROUTINE X 2)  URINALYSIS, W/ REFLEX TO CULTURE (INFECTION SUSPECTED)  MAGNESIUM  TROPONIN I (HIGH SENSITIVITY)    EKG None  Radiology No results found.  Procedures Procedures    Medications Ordered in ED Medications  potassium chloride 10 mEq in 100 mL IVPB (10 mEq Intravenous New Bag/Given 07/12/23 1214)  oxymetazoline (AFRIN) 0.05 % nasal spray 1 spray (1 spray Each Nare Given by Other 07/12/23 1120)  lidocaine (XYLOCAINE) 2 % viscous mouth solution 15 mL (15 mLs Mouth/Throat Given by Other 07/12/23 1120)  silver nitrate applicators applicator 1 Application (1  Application Topical Given by Other 07/12/23 1201)  potassium chloride (KLOR-CON) packet 60 mEq (60 mEq Oral Given 07/12/23 1212)    ED Course/ Medical Decision Making/ A&P                                 Medical Decision Making Amount and/or Complexity of Data Reviewed Labs: ordered. Radiology: ordered.  Risk OTC drugs. Prescription drug management.   Patient does remain stable at this time.  Patient is currently receiving a bolus of fluids as he did have some associated orthostatic hypotension.  Blood work has demonstrated hypokalemia and patient was given oral as well as IV potassium in the emergency department.  He does have chronic hypokalemia.  EKG demonstrates a ventricular paced rhythm with no ischemic changes.  Serial troponins were flat and at his baseline.  Do not suspect ACS at this time.  BNP was only mildly elevated and patient has no indication for fluid overload.  Urinalysis demonstrates no indication for urinary tract infection and he has had no urinary retention in the emergency department.  Lactic acid was within normal limits as well.  Will sign patient out to Dr. Hyacinth Meeker at [phone] on 07/12/23 pending CT scan of the chest, abdomen and pelvis.  Patient does have an elevated bilirubin which will be further evaluated with CT scan of abdomen and pelvis.        Final Clinical Impression(s) / ED Diagnoses Final diagnoses:  None    Rx / DC Orders ED Discharge Orders     None         Huston Foley  D, PA-C 07/12/23 1918    Lelon Perla, PA-C 07/12/23 Chalmers Cater, MD 07/12/23 Corky Crafts

## 2023-07-12 NOTE — Discharge Instructions (Signed)
 Your testing today showed that there may be a small residual amount of pneumonia in your lung, I want you to take the medication called levofloxacin once a day for the next 7 days to treat this.  We gave you the first dose here.  Your potassium was also low and will need to be replaced over the next week, they will need to check your labs within 4 days at the facility where you are currently staying.  Thank you for allowing [place] to treat you in the emergency department today.  After reviewing your examination and potential testing that was done it appears that you are safe to go home.  I would like for you to follow-up with your doctor within the next several days, have them obtain your records and follow-up with them to review all potential tests and results from your visit.  If you should develop severe or worsening symptoms return to the emergency department immediately

## 2023-07-12 NOTE — ED Notes (Signed)
 PA notified of K+ result

## 2023-07-13 NOTE — ED Provider Notes (Signed)
  Provider Note MRN:  [id number]  Arrival date & time: 07/13/23    ED Course and Medical Decision Making  Assumed care of patient at sign-out or upon transfer.  Patient was awaiting transport back to facility.  During the attempted transition from patient's bed to the EMS stretcher he began having epistaxis again.  Fairly minimal dripping from the left nare.  I had patient blow his nose, we applied Afrin and applied firm pressure for 20 minutes.  The bleeding now seems to have resolved.  Will monitor briefly to ensure no return of bleeding but seems the patient is now appropriate for discharge.  Procedures  Final Clinical Impressions(s) / ED Diagnoses     ICD-10-CM   1. Weakness  R53.1     2. Hypokalemia  E87.6     3. Elevated bilirubin  R17     4. Chronic bilateral pleural effusions  J90       ED Discharge Orders          Ordered    potassium chloride (KLOR-CON) 10 MEQ tablet  Daily        07/12/23 1954    levofloxacin (LEVAQUIN) 500 MG tablet  Daily        07/12/23 1954              Discharge Instructions      Your testing today showed that there may be a small residual amount of pneumonia in your lung, I want you to take the medication called levofloxacin once a day for the next 7 days to treat this.  We gave you the first dose here.  Your potassium was also low and will need to be replaced over the next week, they will need to check your labs within 4 days at the facility where you are currently staying.  Thank you for allowing [place] to treat you in the emergency department today.  After reviewing your examination and potential testing that was done it appears that you are safe to go home.  I would like for you to follow-up with your doctor within the next several days, have them obtain your records and follow-up with them to review all potential tests and results from your visit.  If you should develop severe or worsening symptoms return to the emergency department  immediately    Roy [name]. Pilar Plate, MD [facility] [email or web address]    Sabas Sous, MD 07/13/23 [phone]

## 2023-07-13 NOTE — ED Notes (Signed)
 While assisting patient to EMS stretcher, patient's nose began bleeding. Bero, MD made aware and at bedside; administered Afrin and pressure applied

## 2023-07-13 NOTE — ED Notes (Signed)
Nose bleed controlled at this time

## 2023-07-17 ENCOUNTER — Other Ambulatory Visit: Payer: Self-pay

## 2023-07-17 ENCOUNTER — Emergency Department (HOSPITAL_COMMUNITY)
Admission: EM | Admit: 2023-07-17 | Discharge: 2023-07-18 | Disposition: A | Discharge status: Other Acute Inpatient Hospital | Attending: Emergency Medicine | Admitting: Emergency Medicine

## 2023-07-17 DIAGNOSIS — R040 Epistaxis: Secondary | ICD-10-CM | POA: Diagnosis present

## 2023-07-17 DIAGNOSIS — Z79899 Other long term (current) drug therapy: Secondary | ICD-10-CM | POA: Insufficient documentation

## 2023-07-17 DIAGNOSIS — Z7901 Long term (current) use of anticoagulants: Secondary | ICD-10-CM | POA: Insufficient documentation

## 2023-07-17 DIAGNOSIS — E876 Hypokalemia: Secondary | ICD-10-CM | POA: Insufficient documentation

## 2023-07-17 DIAGNOSIS — I5033 Acute on chronic diastolic (congestive) heart failure: Secondary | ICD-10-CM | POA: Diagnosis not present

## 2023-07-17 LAB — BASIC METABOLIC PANEL
Anion gap: 10 (ref 5–15)
BUN: 15 mg/dL (ref 8–23)
CO2: 42 mmol/L — ABNORMAL HIGH (ref 22–32)
Calcium: 8.7 mg/dL — ABNORMAL LOW (ref 8.9–10.3)
Chloride: 88 mmol/L — ABNORMAL LOW (ref 98–111)
Creatinine, Ser: 0.74 mg/dL (ref 0.61–1.24)
GFR, Estimated: >60 mL/min (ref >60)
Glucose, Bld: 121 mg/dL — ABNORMAL HIGH (ref 70–99)
Potassium: 2.9 mmol/L — ABNORMAL LOW (ref 3.5–5.1)
Sodium: 140 mmol/L (ref 135–145)

## 2023-07-17 LAB — CULTURE, BLOOD (ROUTINE X 2)
Culture: NO GROWTH
Culture: NO GROWTH

## 2023-07-17 LAB — CBC
HCT: 43.4 % (ref 39.0–52.0)
Hemoglobin: 13.6 g/dL (ref 13.0–17.0)
MCH: 30.4 pg (ref 26.0–34.0)
MCHC: 31.3 g/dL (ref 30.0–36.0)
MCV: 96.9 fL (ref 80.0–100.0)
Platelets: 265 10*3/uL (ref 150–400)
RBC: 4.48 MIL/uL (ref 4.22–5.81)
RDW: 14.5 % (ref 11.5–15.5)
WBC: 14 10*3/uL — ABNORMAL HIGH (ref 4.0–10.5)
nRBC: 0 % (ref 0.0–0.2)

## 2023-07-17 MED ORDER — LIDOCAINE-EPINEPHRINE (PF) 2 %-1:200000 IJ SOLN
10.0000 mL | Freq: Once | INTRAMUSCULAR | Status: DC
  Filled 2023-07-17: qty 20

## 2023-07-17 MED ORDER — LIDOCAINE-EPINEPHRINE 2 %-1:100000 IJ SOLN
20.0000 mL | Freq: Once | INTRAMUSCULAR | Status: DC
  Filled 2023-07-17: qty 20

## 2023-07-17 MED ORDER — MUPIROCIN 2 % EX OINT: 1.0000 | TOPICAL_OINTMENT | Freq: Two times a day (BID) | CUTANEOUS | 0 refills | Status: DC

## 2023-07-17 NOTE — Discharge Instructions (Addendum)
 Your testing today is unremarkable, you are not anemic, your nosebleed is stopped.  Part of the reason that you are having them is because you are on oxygen which dries out your nose.  Please apply the mupirocin ointment on the inside of your nostrils every day, you will need to see the ear nose and throat doctor I gave you the phone number, it is your responsibility to call to make an appointment to be seen within the week.  Your potassium level was low but it is always low, your family doctor will need to alter your medications to try to get this back to a normal range  If the bleeding occurs again you may use Afrin nasal spray and hold pressure, if this does not help he can come back to the ER  Emergency department for severe worsening symptoms

## 2023-07-17 NOTE — ED Provider Notes (Signed)
 [place] EMERGENCY DEPARTMENT AT [facility] Provider Note   CSN: [id number] Arrival date & time: 07/17/23  1546     History  Chief Complaint  Patient presents with   Epistaxis    Roy [name] is a 73 y.o. male.   Epistaxis    73 year old male on Eliquis presenting from nursing facility with epistaxis, this patient has been seen in the emergency department a couple of times over the last month, he was admitted for acute on chronic congestive heart failure then acute hypoxic respiratory failure within a week, it is now about 2-1/2 weeks later and the patient is presenting with epistaxis.  He states this has happened several times, he has left-sided anterior epistaxis which has been intermittent today, mild, but now seems to be going down his throat a little bit.  He has no coughing or fevers or shortness of breath, he does not feel like his legs are swollen, overall has been doing well and actually worked with physical therapy today.  Home Medications Prior to Admission medications   Medication Sig Start Date End Date Taking? Authorizing Provider  acetaminophen (TYLENOL) 325 MG tablet Take 2 tablets (650 mg total) by mouth every 6 (six) hours as needed for mild pain (pain score 1-3), fever or headache (or Fever >/= 101). 06/29/23  Yes Emokpae, Courage, MD  alfuzosin (UROXATRAL) 10 MG 24 hr tablet TAKE ONE TABLET DAILY WITH BREAKFAST 06/24/23  Yes Sharee Holster, NP  apixaban (ELIQUIS) 5 MG TABS tablet Take 1 tablet (5 mg total) by mouth 2 (two) times daily. 06/24/23  Yes Sharee Holster, NP  calcium carbonate (OS-CAL - DOSED IN MG OF ELEMENTAL CALCIUM) 1250 (500 Ca) MG tablet Take 1 tablet (1,250 mg total) by mouth 2 (two) times daily with a meal. 06/06/23  Yes Osvaldo Shipper, MD  ferrous sulfate 325 (65 FE) MG tablet Take 1 tablet (325 mg total) by mouth daily with breakfast. 06/24/23  Yes Sharee Holster, NP  fludrocortisone (FLORINEF) 0.1 MG tablet Take 1 tablet (0.1 mg  total) by mouth daily. 06/29/23  Yes Emokpae, Courage, MD  guaiFENesin (MUCINEX) 600 MG 12 hr tablet Take 1 tablet (600 mg total) by mouth 2 (two) times daily. 07/10/23 07/09/24 Yes Emokpae, Courage, MD  levofloxacin (LEVAQUIN) 500 MG tablet Take 1 tablet (500 mg total) by mouth daily. 07/12/23  Yes Eber Hong, MD  levothyroxine (SYNTHROID) 100 MCG tablet Take 1 tablet (100 mcg total) by mouth daily before breakfast. 06/24/23  Yes Sharee Holster, NP  magnesium oxide (MAG-OX) 400 (240 Mg) MG tablet Take 1 tablet (400 mg total) by mouth daily. 06/24/23  Yes Sharee Holster, NP  methocarbamol (ROBAXIN) 500 MG tablet Take 1 tablet (500 mg total) by mouth every 6 (six) hours as needed for muscle spasms. 06/24/23  Yes Sharee Holster, NP  midodrine (PROAMATINE) 10 MG tablet Take 1 tablet (10 mg total) by mouth 2 (two) times daily with a meal. 06/29/23  Yes Emokpae, Courage, MD  mupirocin ointment (BACTROBAN) 2 % Apply 1 Application topically 2 (two) times daily. 07/17/23  Yes Eber Hong, MD  pantoprazole (PROTONIX) 40 MG tablet Take 1 tablet (40 mg total) by mouth daily. 06/29/23 06/28/24 Yes Emokpae, Courage, MD  polyethylene glycol (MIRALAX) 17 g packet Take 17 g by mouth daily. 06/29/23  Yes Shon Hale, MD  Potassium Chloride ER 20 MEQ TBCR Take 1 tablet (20 mEq total) by mouth daily. 1 tab daily by mouth--- take while  taking Lasix/furosemide 07/10/23  Yes Emokpae, Courage, MD  predniSONE (DELTASONE) 10 MG tablet Take 1 tablet (10 mg total) by mouth daily with breakfast. 06/24/23  Yes Sharee Holster, NP  pregabalin (LYRICA) 200 MG capsule Take 1 capsule (200 mg total) by mouth 2 (two) times daily. 07/10/23  Yes Emokpae, Courage, MD  rosuvastatin (CRESTOR) 5 MG tablet Take 1 tablet (5 mg total) by mouth at bedtime. 06/24/23  Yes Sharee Holster, NP  senna-docusate (SENOKOT-S) 8.6-50 MG tablet Take 2 tablets by mouth at bedtime. 06/29/23  Yes Emokpae, Courage, MD  sodium chloride (OCEAN) 0.65 % SOLN nasal  spray Place 2 sprays into both nostrils as needed for congestion. 06/29/23  Yes Emokpae, Courage, MD  torsemide (DEMADEX) 20 MG tablet Take 1 tablet (20 mg total) by mouth daily. 07/11/23  Yes Emokpae, Courage, MD  valACYclovir (VALTREX) 1000 MG tablet Take 1 tablet (1,000 mg total) by mouth daily. 06/24/23  Yes Sharee Holster, NP  vitamin B-12 (CYANOCOBALAMIN) 1000 MCG tablet Take 1 tablet (1,000 mcg total) by mouth daily. 02/28/21  Yes Pennington, Rebekah M, PA-C  Vitamin D, Ergocalciferol, (DRISDOL) 1.25 MG (50000 UNIT) CAPS capsule Take 1 capsule (50,000 Units total) by mouth every 7 (seven) days. 06/24/23  Yes Sharee Holster, NP      Allergies    Sulfa antibiotics and Adhesive [tape]    Review of Systems   Review of Systems  HENT:  Positive for nosebleeds.   All other systems reviewed and are negative.   Physical Exam Updated Vital Signs BP 108/74   Pulse 64   Temp 97.9 F (36.6 C) (Oral)   Resp 17   Ht 1.829 m (6')   Wt 103 kg   SpO2 97%   BMI 30.80 kg/m  Physical Exam Vitals and nursing note reviewed.  Constitutional:      General: He is not in acute distress.    Appearance: He is well-developed.  HENT:     Head: Normocephalic and atraumatic.     Nose:     Comments: Right anterior nostril is clear of blood, left anterior nostril has some dried blood, there is a small amount of blood in the posterior oropharynx    Mouth/Throat:     Pharynx: No oropharyngeal exudate.  Eyes:     General: No scleral icterus.       Right eye: No discharge.        Left eye: No discharge.     Conjunctiva/sclera: Conjunctivae normal.     Pupils: Pupils are equal, round, and reactive to light.  Neck:     Thyroid: No thyromegaly.     Vascular: No JVD.  Cardiovascular:     Rate and Rhythm: Normal rate and regular rhythm.     Heart sounds: Normal heart sounds. No murmur heard.    No friction rub. No gallop.  Pulmonary:     Effort: Pulmonary effort is normal. No respiratory distress.      Breath sounds: Normal breath sounds. No wheezing or rales.  Abdominal:     General: Bowel sounds are normal. There is no distension.     Palpations: Abdomen is soft. There is no mass.     Tenderness: There is no abdominal tenderness.  Musculoskeletal:        General: No tenderness. Normal range of motion.     Cervical back: Normal range of motion and neck supple.     Right lower leg: Edema present.     Left  lower leg: Edema present.     Comments: Scant bilateral lower extremity edema  Lymphadenopathy:     Cervical: No cervical adenopathy.  Skin:    General: Skin is warm and dry.     Findings: No erythema or rash.  Neurological:     Mental Status: He is alert.     Coordination: Coordination normal.  Psychiatric:        Behavior: Behavior normal.     ED Results / Procedures / Treatments   Labs (all labs ordered are listed, but only abnormal results are displayed) Labs Reviewed  CBC - Abnormal; Notable for the following components:      Result Value   WBC 14.0 (*)    All other components within normal limits  BASIC METABOLIC PANEL - Abnormal; Notable for the following components:   Potassium 2.9 (*)    Chloride 88 (*)    CO2 42 (*)    Glucose, Bld 121 (*)    Calcium 8.7 (*)    All other components within normal limits    EKG EKG Interpretation Date/Time:  Wednesday July 17 2023 17:12:25 EDT Ventricular Rate:  65 PR Interval:    QRS Duration:  160 QT Interval:  464 QTC Calculation: 483 R Axis:   -33  Text Interpretation: Afib/flutter and ventricular-paced rhythm No further analysis attempted due to paced rhythm Baseline wander in lead(s) V2 Confirmed by Eber Hong ([id number]) on 07/17/2023 6:32:13 PM  Radiology No results found.  Procedures Procedures    Medications Ordered in ED Medications  lidocaine-EPINEPHrine (XYLOCAINE W/EPI) 2 %-1:200000 (PF) injection 10 mL (has no administration in time range)    ED Course/ Medical Decision Making/ A&P                                  Medical Decision Making Amount and/or Complexity of Data Reviewed Labs: ordered.  Risk Prescription drug management.   Normal phonation, vital signs unremarkable, patient will need to have his nose evacuated and potentially either cauterized or packed as this continues to happen.  He appears hemodynamically stable  Labs: I personally viewed and interpreted labs, no anemia, mild hypokalemia, the patient has had no recurrent bleeding, he has been without any bleeding for a couple of hours, vital signs are unremarkable, no hypotension or tachycardia, stable for discharge  Packing not required, repeat exam shows no evidence of bleeding  Meds: Will add mupirocin ointment, he is on oxygen at the nursing facility which is likely drying out his nose        Final Clinical Impression(s) / ED Diagnoses Final diagnoses:  Left-sided epistaxis    Rx / DC Orders ED Discharge Orders          Ordered    mupirocin ointment (BACTROBAN) 2 %  2 times daily        07/17/23 2018              Eber Hong, MD 07/17/23 2019

## 2023-07-17 NOTE — ED Notes (Signed)
Notified [place] [facility] of patient needing transportation back to [facility]. 

## 2023-07-17 NOTE — ED Triage Notes (Addendum)
 Pt arrive via EMS from [facility] due to nose bleed. Pt is on blood thinners.

## 2023-07-30 NOTE — Progress Notes (Signed)
 Remote pacemaker transmission.

## 2023-07-30 NOTE — Addendum Note (Signed)
 Addended by: Geralyn Flash D on: 07/30/2023 04:05 PM   Modules accepted: Orders

## 2023-08-07 ENCOUNTER — Other Ambulatory Visit: Payer: Self-pay | Admitting: Adult Health

## 2023-08-08 ENCOUNTER — Emergency Department (HOSPITAL_COMMUNITY)

## 2023-08-08 ENCOUNTER — Other Ambulatory Visit: Payer: Self-pay

## 2023-08-08 ENCOUNTER — Inpatient Hospital Stay (HOSPITAL_COMMUNITY)
Admission: EM | Admit: 2023-08-08 | Discharge: 2023-08-13 | DRG: 641 | Disposition: A | Discharge status: Skilled Nursing Facility | Attending: Family Medicine | Admitting: Family Medicine

## 2023-08-08 ENCOUNTER — Encounter (HOSPITAL_COMMUNITY): Payer: Self-pay

## 2023-08-08 DIAGNOSIS — M25551 Pain in right hip: Principal | ICD-10-CM

## 2023-08-08 DIAGNOSIS — Z8349 Family history of other endocrine, nutritional and metabolic diseases: Secondary | ICD-10-CM

## 2023-08-08 DIAGNOSIS — R531 Weakness: Secondary | ICD-10-CM

## 2023-08-08 DIAGNOSIS — I5032 Chronic diastolic (congestive) heart failure: Secondary | ICD-10-CM

## 2023-08-08 DIAGNOSIS — R54 Age-related physical debility: Secondary | ICD-10-CM | POA: Diagnosis present

## 2023-08-08 DIAGNOSIS — I4891 Unspecified atrial fibrillation: Secondary | ICD-10-CM | POA: Diagnosis present

## 2023-08-08 DIAGNOSIS — I482 Chronic atrial fibrillation, unspecified: Secondary | ICD-10-CM | POA: Diagnosis present

## 2023-08-08 DIAGNOSIS — E876 Hypokalemia: Principal | ICD-10-CM

## 2023-08-08 DIAGNOSIS — W19XXXA Unspecified fall, initial encounter: Secondary | ICD-10-CM

## 2023-08-08 DIAGNOSIS — Z8572 Personal history of non-Hodgkin lymphomas: Secondary | ICD-10-CM

## 2023-08-08 DIAGNOSIS — K219 Gastro-esophageal reflux disease without esophagitis: Secondary | ICD-10-CM

## 2023-08-08 DIAGNOSIS — Z91048 Other nonmedicinal substance allergy status: Secondary | ICD-10-CM

## 2023-08-08 DIAGNOSIS — K5900 Constipation, unspecified: Secondary | ICD-10-CM | POA: Diagnosis present

## 2023-08-08 DIAGNOSIS — Z79899 Other long term (current) drug therapy: Secondary | ICD-10-CM

## 2023-08-08 DIAGNOSIS — Z6835 Body mass index (BMI) 35.0-35.9, adult: Secondary | ICD-10-CM

## 2023-08-08 DIAGNOSIS — I350 Nonrheumatic aortic (valve) stenosis: Secondary | ICD-10-CM | POA: Diagnosis present

## 2023-08-08 DIAGNOSIS — Z7952 Long term (current) use of systemic steroids: Secondary | ICD-10-CM

## 2023-08-08 DIAGNOSIS — Z8679 Personal history of other diseases of the circulatory system: Secondary | ICD-10-CM

## 2023-08-08 DIAGNOSIS — Z9981 Dependence on supplemental oxygen: Secondary | ICD-10-CM

## 2023-08-08 DIAGNOSIS — S50311A Abrasion of right elbow, initial encounter: Secondary | ICD-10-CM | POA: Diagnosis not present

## 2023-08-08 DIAGNOSIS — R296 Repeated falls: Secondary | ICD-10-CM | POA: Diagnosis present

## 2023-08-08 DIAGNOSIS — Z6836 Body mass index (BMI) 36.0-36.9, adult: Secondary | ICD-10-CM

## 2023-08-08 DIAGNOSIS — J9611 Chronic respiratory failure with hypoxia: Secondary | ICD-10-CM | POA: Diagnosis present

## 2023-08-08 DIAGNOSIS — I951 Orthostatic hypotension: Secondary | ICD-10-CM | POA: Diagnosis present

## 2023-08-08 DIAGNOSIS — E2740 Unspecified adrenocortical insufficiency: Secondary | ICD-10-CM | POA: Diagnosis present

## 2023-08-08 DIAGNOSIS — E782 Mixed hyperlipidemia: Secondary | ICD-10-CM | POA: Diagnosis present

## 2023-08-08 DIAGNOSIS — Z8249 Family history of ischemic heart disease and other diseases of the circulatory system: Secondary | ICD-10-CM

## 2023-08-08 DIAGNOSIS — Z7901 Long term (current) use of anticoagulants: Secondary | ICD-10-CM

## 2023-08-08 DIAGNOSIS — Y92009 Unspecified place in unspecified non-institutional (private) residence as the place of occurrence of the external cause: Secondary | ICD-10-CM

## 2023-08-08 DIAGNOSIS — I110 Hypertensive heart disease with heart failure: Secondary | ICD-10-CM | POA: Diagnosis present

## 2023-08-08 DIAGNOSIS — M62838 Other muscle spasm: Secondary | ICD-10-CM | POA: Diagnosis present

## 2023-08-08 DIAGNOSIS — E669 Obesity, unspecified: Secondary | ICD-10-CM

## 2023-08-08 DIAGNOSIS — Z882 Allergy status to sulfonamides status: Secondary | ICD-10-CM

## 2023-08-08 DIAGNOSIS — Z79890 Hormone replacement therapy (postmenopausal): Secondary | ICD-10-CM

## 2023-08-08 DIAGNOSIS — E039 Hypothyroidism, unspecified: Secondary | ICD-10-CM

## 2023-08-08 DIAGNOSIS — Z8639 Personal history of other endocrine, nutritional and metabolic disease: Secondary | ICD-10-CM

## 2023-08-08 LAB — COMPREHENSIVE METABOLIC PANEL WITH GFR
ALT: 19 U/L (ref 0–44)
AST: 29 U/L (ref 15–41)
Albumin: 3.5 g/dL (ref 3.5–5.0)
Alkaline Phosphatase: 114 U/L (ref 38–126)
Anion gap: 16 — ABNORMAL HIGH (ref 5–15)
BUN: 19 mg/dL (ref 8–23)
CO2: 43 mmol/L — ABNORMAL HIGH (ref 22–32)
Calcium: 9.1 mg/dL (ref 8.9–10.3)
Chloride: 80 mmol/L — ABNORMAL LOW (ref 98–111)
Creatinine, Ser: 1.04 mg/dL (ref 0.61–1.24)
GFR, Estimated: >60 mL/min (ref >60)
Glucose, Bld: 137 mg/dL — ABNORMAL HIGH (ref 70–99)
Potassium: 2.2 mmol/L — CL (ref 3.5–5.1)
Sodium: 139 mmol/L (ref 135–145)
Total Bilirubin: 1.7 mg/dL — ABNORMAL HIGH (ref 0.0–1.2)
Total Protein: 6.7 g/dL (ref 6.5–8.1)

## 2023-08-08 LAB — LIPASE, BLOOD: Lipase: 24 U/L (ref 11–51)

## 2023-08-08 LAB — URINALYSIS, ROUTINE W REFLEX MICROSCOPIC
Bilirubin Urine: NEGATIVE
Glucose, UA: NEGATIVE mg/dL
Hgb urine dipstick: NEGATIVE
Ketones, ur: NEGATIVE mg/dL
Leukocytes,Ua: NEGATIVE
Nitrite: NEGATIVE
Protein, ur: NEGATIVE mg/dL
Specific Gravity, Urine: 1.014 (ref 1.005–1.030)
pH: 6 (ref 5.0–8.0)

## 2023-08-08 LAB — MAGNESIUM: Magnesium: 1.8 mg/dL (ref 1.7–2.4)

## 2023-08-08 LAB — CBC WITH DIFFERENTIAL/PLATELET
Abs Immature Granulocytes: 0.03 10*3/uL (ref 0.00–0.07)
Basophils Absolute: 0 10*3/uL (ref 0.0–0.1)
Basophils Relative: 0 %
Eosinophils Absolute: 0.1 10*3/uL (ref 0.0–0.5)
Eosinophils Relative: 1 %
HCT: 42.6 % (ref 39.0–52.0)
Hemoglobin: 13.2 g/dL (ref 13.0–17.0)
Immature Granulocytes: 0 %
Lymphocytes Relative: 9 %
Lymphs Abs: 1 10*3/uL (ref 0.7–4.0)
MCH: 29.7 pg (ref 26.0–34.0)
MCHC: 31 g/dL (ref 30.0–36.0)
MCV: 95.7 fL (ref 80.0–100.0)
Monocytes Absolute: 0.4 10*3/uL (ref 0.1–1.0)
Monocytes Relative: 4 %
Neutro Abs: 9.7 10*3/uL — ABNORMAL HIGH (ref 1.7–7.7)
Neutrophils Relative %: 86 %
Platelets: 214 10*3/uL (ref 150–400)
RBC: 4.45 MIL/uL (ref 4.22–5.81)
RDW: 15.3 % (ref 11.5–15.5)
WBC: 11.2 10*3/uL — ABNORMAL HIGH (ref 4.0–10.5)
nRBC: 0 % (ref 0.0–0.2)

## 2023-08-08 MED ORDER — POTASSIUM CHLORIDE 10 MEQ/100ML IV SOLN
10.0000 meq | Freq: Once | INTRAVENOUS | Status: AC
  Administered 2023-08-08: 10 meq via INTRAVENOUS
  Filled 2023-08-08: qty 100

## 2023-08-08 MED ORDER — OXYCODONE HCL 5 MG PO TABS
5.0000 mg | ORAL_TABLET | ORAL | Status: DC | PRN
  Administered 2023-08-09 – 2023-08-10 (×4): 5 mg via ORAL
  Filled 2023-08-08 (×4): qty 1

## 2023-08-08 MED ORDER — IOHEXOL 300 MG/ML  SOLN
100.0000 mL | Freq: Once | INTRAMUSCULAR | Status: AC | PRN
  Administered 2023-08-08: 100 mL via INTRAVENOUS

## 2023-08-08 MED ORDER — POTASSIUM CHLORIDE 10 MEQ/100ML IV SOLN
10.0000 meq | INTRAVENOUS | Status: AC
  Administered 2023-08-08 (×2): 10 meq via INTRAVENOUS
  Filled 2023-08-08 (×2): qty 100

## 2023-08-08 MED ORDER — POTASSIUM CHLORIDE CRYS ER 20 MEQ PO TBCR
40.0000 meq | EXTENDED_RELEASE_TABLET | Freq: Once | ORAL | Status: AC
  Administered 2023-08-08: 40 meq via ORAL
  Filled 2023-08-08: qty 2

## 2023-08-08 NOTE — ED Provider Notes (Signed)
 [place] EMERGENCY DEPARTMENT AT [facility] Provider Note   CSN: [id number] Arrival date & time: 08/08/23  1245     History  Chief Complaint  Patient presents with   Hip Pain    Roy CROM is a 73 y.o. male.  He has history of heart failure, aortic valve stenosis, atrial fibrillation, obesity, T11 vertebral fracture, chronic hypoxic respiratory failure on 1 L  [place] at baseline.   The ER today via EMS for right hip pain.  Patient reports he got his leg caught up in his oxygen tubing on Saturday, he was using his walker and stumbled and hit against the wall has been having right hip pain since then.  States normally that is his good leg usually put all his weight on the side but has not been able to put weight on this leg since then.  I spoke with his son on the phone who states that he got home from nursing home last week and was able to get a shower with minimal assistance.  He had been walking well but last night he went over to try to help him shower and he could not put weight on the right leg.  Decided today to come for the further evaluation to the ER.  He has been having difficulty with constipation as well.  Had an enema last week before leaving the nursing home is had several small bowel movements but not able to fully empty his bowels, complaining of lower abdominal pain today as well on the right side.  Denies fever or chills, no blood in the stool.  No urinary symptoms.   Hip Pain       Home Medications Prior to Admission medications   Medication Sig Start Date End Date Taking? Authorizing Provider  acetaminophen (TYLENOL) 325 MG tablet Take 2 tablets (650 mg total) by mouth every 6 (six) hours as needed for mild pain (pain score 1-3), fever or headache (or Fever >/= 101). 06/29/23   Emokpae, Courage, MD  alfuzosin (UROXATRAL) 10 MG 24 hr tablet TAKE ONE TABLET DAILY WITH BREAKFAST 06/24/23   Sharee Holster, NP  apixaban (ELIQUIS) 5 MG TABS tablet Take 1 tablet  (5 mg total) by mouth 2 (two) times daily. 06/24/23   Sharee Holster, NP  calcium carbonate (OS-CAL - DOSED IN MG OF ELEMENTAL CALCIUM) 1250 (500 Ca) MG tablet Take 1 tablet (1,250 mg total) by mouth 2 (two) times daily with a meal. 06/06/23   Osvaldo Shipper, MD  ferrous sulfate 325 (65 FE) MG tablet Take 1 tablet (325 mg total) by mouth daily with breakfast. 06/24/23   Sharee Holster, NP  fludrocortisone (FLORINEF) 0.1 MG tablet Take 1 tablet (0.1 mg total) by mouth daily. 06/29/23   Shon Hale, MD  guaiFENesin (MUCINEX) 600 MG 12 hr tablet Take 1 tablet (600 mg total) by mouth 2 (two) times daily. 07/10/23 07/09/24  Shon Hale, MD  levofloxacin (LEVAQUIN) 500 MG tablet Take 1 tablet (500 mg total) by mouth daily. 07/12/23   Eber Hong, MD  levothyroxine (SYNTHROID) 100 MCG tablet Take 1 tablet (100 mcg total) by mouth daily before breakfast. 06/24/23   Sharee Holster, NP  magnesium oxide (MAG-OX) 400 (240 Mg) MG tablet Take 1 tablet (400 mg total) by mouth daily. 06/24/23   Sharee Holster, NP  methocarbamol (ROBAXIN) 500 MG tablet Take 1 tablet (500 mg total) by mouth every 6 (six) hours as needed for muscle spasms. 06/24/23  Sharee Holster, NP  midodrine (PROAMATINE) 10 MG tablet Take 1 tablet (10 mg total) by mouth 2 (two) times daily with a meal. 06/29/23   Emokpae, Courage, MD  mupirocin ointment (BACTROBAN) 2 % Apply 1 Application topically 2 (two) times daily. 07/17/23   Eber Hong, MD  pantoprazole (PROTONIX) 40 MG tablet Take 1 tablet (40 mg total) by mouth daily. 06/29/23 06/28/24  Shon Hale, MD  polyethylene glycol (MIRALAX) 17 g packet Take 17 g by mouth daily. 06/29/23   Shon Hale, MD  Potassium Chloride ER 20 MEQ TBCR Take 1 tablet (20 mEq total) by mouth daily. 1 tab daily by mouth--- take while taking Lasix/furosemide 07/10/23   Shon Hale, MD  predniSONE (DELTASONE) 10 MG tablet Take 1 tablet (10 mg total) by mouth daily with breakfast. 06/24/23   Sharee Holster, NP  pregabalin (LYRICA) 200 MG capsule Take 1 capsule (200 mg total) by mouth 2 (two) times daily. 07/10/23   Shon Hale, MD  rosuvastatin (CRESTOR) 5 MG tablet Take 1 tablet (5 mg total) by mouth at bedtime. 06/24/23   Sharee Holster, NP  senna-docusate (SENOKOT-S) 8.6-50 MG tablet Take 2 tablets by mouth at bedtime. 06/29/23   Shon Hale, MD  sodium chloride (OCEAN) 0.65 % SOLN nasal spray Place 2 sprays into both nostrils as needed for congestion. 06/29/23   Shon Hale, MD  torsemide (DEMADEX) 20 MG tablet Take 1 tablet (20 mg total) by mouth daily. 07/11/23   Shon Hale, MD  valACYclovir (VALTREX) 1000 MG tablet Take 1 tablet (1,000 mg total) by mouth daily. 06/24/23   Sharee Holster, NP  vitamin B-12 (CYANOCOBALAMIN) 1000 MCG tablet Take 1 tablet (1,000 mcg total) by mouth daily. 02/28/21   Carnella Guadalajara, PA-C  Vitamin D, Ergocalciferol, (DRISDOL) 1.25 MG (50000 UNIT) CAPS capsule Take 1 capsule (50,000 Units total) by mouth every 7 (seven) days. 06/24/23   Sharee Holster, NP      Allergies    Sulfa antibiotics and Adhesive [tape]    Review of Systems   Review of Systems  Physical Exam Updated Vital Signs BP 91/72 (BP Location: Right Arm)   Pulse 65   Temp 97.6 F (36.4 C) (Temporal)   Resp 16   Ht 6' (1.829 m)   Wt 103 kg   SpO2 99%   BMI 30.80 kg/m  Physical Exam Vitals and nursing note reviewed.  Constitutional:      General: He is not in acute distress.    Appearance: He is well-developed.  HENT:     Head: Normocephalic and atraumatic.  Eyes:     Conjunctiva/sclera: Conjunctivae normal.  Cardiovascular:     Rate and Rhythm: Normal rate and regular rhythm.     Heart sounds: No murmur heard. Pulmonary:     Effort: Pulmonary effort is normal. No respiratory distress.     Breath sounds: Normal breath sounds.  Abdominal:     Palpations: Abdomen is soft.     Tenderness: There is abdominal tenderness in the right lower quadrant.   Musculoskeletal:        General: No swelling.     Cervical back: Neck supple.  Skin:    General: Skin is warm and dry.     Capillary Refill: Capillary refill takes less than 2 seconds.  Neurological:     Mental Status: He is alert.  Psychiatric:        Mood and Affect: Mood normal.     ED Results /  Procedures / Treatments   Labs (all labs ordered are listed, but only abnormal results are displayed) Labs Reviewed  CBC WITH DIFFERENTIAL/PLATELET  COMPREHENSIVE METABOLIC PANEL WITH GFR  LIPASE, BLOOD  URINALYSIS, ROUTINE W REFLEX MICROSCOPIC    EKG None  Radiology DG HIP UNILAT WITH PELVIS 2-3 VIEWS RIGHT Result Date: 08/08/2023 CLINICAL DATA:  Status post fall with worsening hip pain EXAM: DG HIP (WITH OR WITHOUT PELVIS) 3V RIGHT COMPARISON:  None Available. FINDINGS: Partially imaged left hip arthroplasty. Hardware appears intact and well seated. There is no evidence of hip fracture or dislocation. Degenerative changes of the right hip and partially imaged lumbar spine. Single surgical clip projects over the right lower quadrant. Gas-filled dilation of the presumed sigmoid colon. IMPRESSION: 1. No acute displaced fracture or dislocation. 2. Gas-filled dilation of the presumed sigmoid colon, likely ileus. Electronically Signed   By: Agustin Cree M.D.   On: 08/08/2023 14:36    Procedures Procedures    Medications Ordered in ED Medications - No data to display  ED Course/ Medical Decision Making/ A&P                                 Medical Decision Making Differential diagnosis includes but limited to fracture, contusion, dislocation, electrolyte disturbance, constipation, bowel obstruction, ileus, other ED course: Patient had fall at home, now able to ambulate since the fall, normally his right leg is a strong leg but now he cannot bear weight on this.  X-ray did not show any fracture, he is having right lower quadrant abdominal pain as well, also having constipation.  Labs and  CT ordered, labs are significant for severe hypokalemia, IV potassium ordered, pending CT abdomen pelvis at this time, signed out to [employer], [employer].  Dissipate hospitalization for hypokalemia and possible placement.   Amount and/or Complexity of Data Reviewed Labs: ordered. Radiology: ordered.  Risk Prescription drug management.           Final Clinical Impression(s) / ED Diagnoses Final diagnoses:  None    Rx / DC Orders ED Discharge Orders     None         Josem Kaufmann 08/08/23 1858    Terrilee Files, MD 08/09/23 [phone]

## 2023-08-08 NOTE — ED Provider Notes (Signed)
  Physical Exam  BP 108/69   Pulse 66   Temp 97.8 F (36.6 C) (Oral)   Resp 16   Ht 6' (1.829 m)   Wt 103 kg   SpO2 98%   BMI 30.80 kg/m   Physical Exam  Procedures  Procedures  ED Course / MDM    Medical Decision Making Amount and/or Complexity of Data Reviewed Labs: ordered. Radiology: ordered.  Risk Prescription drug management. Decision regarding hospitalization.   Patient was signed out to myself by Carmel Sacramento, PA-C pending CT scan of the abdomen and pelvis.  CT scan of the abdomen and pelvis demonstrated no acute surgical process.  Patient does have hypokalemia.  EKG was ordered for further evaluation as well.  Did add on oral potassium at this point.  Have discussed patient case with Dr. Thomes Dinning with the hospitalist service for admission given the patient's worsening weakness as well as hypokalemia.  He has been accepted at this time.       Lelon Perla, PA-C 08/08/23 2019    Lonell Grandchild, MD 08/09/23 [phone]

## 2023-08-08 NOTE — ED Triage Notes (Signed)
 Pt arrived via [facility] c/o right hip pain. Pt reports he fell at home recently.

## 2023-08-08 NOTE — H&P (Incomplete)
 History and Physical    Patient: Roy [name]:[id number] DOB: 1950/06/23 DOA: 08/08/2023 DOS: the patient was seen and examined on 08/09/2023 PCP: Rebecka Apley, NP  Patient coming from: Home  Chief Complaint:  Chief Complaint  Patient presents with   Hip Pain   HPI: Roy [name] is a 73 y.o. male with medical history significant of atrial fibrillation maintained on Eliquis, adrenal insufficiency, diffuse large B-cell lymphoma, GERD, hyperlipidemia, hypertension who presents to the emergency department due to complaints of right hip pain.  Patient endorsed sustaining a fall at home on Saturday (4/12), he states that he stumbled and hit against the wall while using his walker, he sustained right elbow abrasion and has been complaining of right hip pain since then. Patient was recently admitted from 2/25 to 3/5 due to community-acquired pneumonia and acute on chronic HFpEF, was discharged to SNF was just recently discharged from SNF to home last week and had been requiring minimal assistance.  However, yesterday, he had difficulty in being able to lift his right leg or put weight on it, so he decided to go to the ED for further evaluation and management..  Patient denies chest pain, shortness of breath, fever or chills.  ED Course:  In the emergency department, BP was 91/72, other vital signs are within normal range.  Workup in the ED showed normal CBC except for WBC of 11.2.  BMP showed sodium 139, potassium 2.2, chloride 88, bicarb 43, blood glucose 137, BUN 19, creatinine 1.04, total bilirubin 1.7, anion gap 16.  Magnesium 1.8, lipase 24, urinalysis was normal. CT abdomen pelvis with contrast showed no acute intra-abdominal or intrapelvic process.  Trace left pleural effusion with minimal left lower lobe atelectasis. Right hip x-ray showed no acute displaced fracture or dislocation Potassium was replenished and patient showed improvement in being able to extend right lower  extremity. Hospitalist was asked to admit patient for further evaluation and management.  Review of Systems: Review of systems as noted in the HPI. All other systems reviewed and are negative.   Past Medical History:  Diagnosis Date   Atrial fibrillation (HCC)    Cancer (HCC)    Diffuse Large B-Cell lymphoma   GERD (gastroesophageal reflux disease)    High cholesterol    Hypertension    Obesity    Pacemaker    Past Surgical History:  Procedure Laterality Date   APPENDECTOMY     BIOPSY  06/28/2023   Procedure: BIOPSY;  Surgeon: Corbin Ade, MD;  [facility]: [facility];  Service: Endoscopy;;   CATARACT EXTRACTION Bilateral 2022   CHOLECYSTECTOMY     ESOPHAGEAL DILATION     multiple times   ESOPHAGOGASTRODUODENOSCOPY (EGD) WITH PROPOFOL N/A 06/28/2023   Procedure: ESOPHAGOGASTRODUODENOSCOPY (EGD) WITH PROPOFOL;  Surgeon: Corbin Ade, MD;  [facility]: [facility];  Service: Endoscopy;  Laterality: N/A;   IR IMAGING GUIDED PORT INSERTION  03/31/2021   JOINT REPLACEMENT Left    hip   LUMBAR PERCUTANEOUS PEDICLE SCREW 3 LEVEL  05/27/2023   Procedure: POSTERIOR FIXATION AND FUSION OF THORACIC ELEVEN-THORACIC TWELVE FRACTURE DISLOCATION. FIXATION FROM THORACIC TEN-THORACIC ELEVEN METHACRYLATE AUGMENTATION WITH FLUOROSCOPIC GUIDANCE;  Surgeon: Barnett Abu, MD;  Location: [facility] OR;  Service: Neurosurgery;;   Elease Hashimoto DILATION N/A 06/28/2023   Procedure: Alvy Beal;  Surgeon: Corbin Ade, MD;  [facility]: [facility];  Service: Endoscopy;  Laterality: N/A;   PACEMAKER IMPLANT N/A 09/07/2022   Procedure: PACEMAKER IMPLANT;  Surgeon: Regan Lemming, MD;  Location: [facility];  Service: Cardiovascular;  Laterality: N/A;    Social History:  reports that he has never smoked. He has never been exposed to tobacco smoke. He has never used smokeless tobacco. He reports that he does not drink alcohol and does not use drugs.   Allergies  Allergen Reactions   Sulfa  Antibiotics Other (See Comments)    Unknown childhood reaction   Adhesive [Tape] Other (See Comments)    Contact dermatitis    Family History  Problem Relation Age of Onset   Heart failure Father    Heart attack Father        Deceased    Thyroid disease Sister      Prior to Admission medications   Medication Sig Start Date End Date Taking? Authorizing Provider  acetaminophen (TYLENOL) 325 MG tablet Take 2 tablets (650 mg total) by mouth every 6 (six) hours as needed for mild pain (pain score 1-3), fever or headache (or Fever >/= 101). 06/29/23  Yes Emokpae, Courage, MD  apixaban (ELIQUIS) 5 MG TABS tablet Take 1 tablet (5 mg total) by mouth 2 (two) times daily. 06/24/23  Yes Sharee Holster, NP  calcium carbonate (OS-CAL - DOSED IN MG OF ELEMENTAL CALCIUM) 1250 (500 Ca) MG tablet Take 1 tablet (1,250 mg total) by mouth 2 (two) times daily with a meal. 06/06/23  Yes Osvaldo Shipper, MD  ferrous sulfate 325 (65 FE) MG tablet Take 1 tablet (325 mg total) by mouth daily with breakfast. 06/24/23  Yes Sharee Holster, NP  fludrocortisone (FLORINEF) 0.1 MG tablet Take 1 tablet (0.1 mg total) by mouth daily. 06/29/23  Yes Emokpae, Courage, MD  furosemide (LASIX) 20 MG tablet Take 20 mg by mouth daily. 07/26/23  Yes [provider]  GNP ZINC OXIDE 20 % ointment Apply 1 Application topically every 8 (eight) hours as needed for irritation or diaper changes. 08/02/23  Yes [provider]  guaiFENesin (MUCINEX) 600 MG 12 hr tablet Take 1 tablet (600 mg total) by mouth 2 (two) times daily. 07/10/23 07/09/24 Yes Shon Hale, MD  levothyroxine (SYNTHROID) 100 MCG tablet Take 1 tablet (100 mcg total) by mouth daily before breakfast. 06/24/23  Yes Sharee Holster, NP  magnesium oxide (MAG-OX) 400 (240 Mg) MG tablet Take 1 tablet (400 mg total) by mouth daily. 06/24/23  Yes Sharee Holster, NP  methocarbamol (ROBAXIN) 500 MG tablet Take 1 tablet (500 mg total) by mouth every 6 (six) hours as  needed for muscle spasms. 06/24/23  Yes Sharee Holster, NP  midodrine (PROAMATINE) 10 MG tablet Take 1 tablet (10 mg total) by mouth 2 (two) times daily with a meal. 06/29/23  Yes Emokpae, Courage, MD  pantoprazole (PROTONIX) 40 MG tablet Take 1 tablet (40 mg total) by mouth daily. 06/29/23 06/28/24 Yes Emokpae, Courage, MD  polyethylene glycol (MIRALAX) 17 g packet Take 17 g by mouth daily. 06/29/23  Yes Shon Hale, MD  Potassium Chloride ER 20 MEQ TBCR Take 1 tablet (20 mEq total) by mouth daily. 1 tab daily by mouth--- take while taking Lasix/furosemide Patient taking differently: Take 20 mEq by mouth in the morning and at bedtime. 1 tab daily by mouth--- take while taking Lasix/furosemide 07/10/23  Yes Emokpae, Courage, MD  predniSONE (DELTASONE) 10 MG tablet Take 1 tablet (10 mg total) by mouth daily with breakfast. 06/24/23  Yes Sharee Holster, NP  rosuvastatin (CRESTOR) 5 MG tablet Take 1 tablet (5 mg total) by mouth at bedtime.  06/24/23  Yes Sharee Holster, NP  torsemide (DEMADEX) 20 MG tablet Take 1 tablet (20 mg total) by mouth daily. 07/11/23  Yes Emokpae, Courage, MD  valACYclovir (VALTREX) 1000 MG tablet Take 1 tablet (1,000 mg total) by mouth daily. 06/24/23  Yes Sharee Holster, NP  vitamin B-12 (CYANOCOBALAMIN) 1000 MCG tablet Take 1 tablet (1,000 mcg total) by mouth daily. 02/28/21  Yes Pennington, Rebekah M, PA-C  Vitamin D, Ergocalciferol, (DRISDOL) 1.25 MG (50000 UNIT) CAPS capsule Take 1 capsule (50,000 Units total) by mouth every 7 (seven) days. 06/24/23  Yes Sharee Holster, NP    Physical Exam: BP 120/64   Pulse 65   Temp 97.9 F (36.6 C)   Resp 15   Ht 6' (1.829 m)   Wt 103 kg   SpO2 100%   BMI 30.80 kg/m   General: 73 y.o. year-old male well developed well nourished in no acute distress.  Alert and oriented x3. HEENT: NCAT, EOMI Neck: Supple, trachea medial Cardiovascular: Regular rate and rhythm with no rubs or gallops.  No thyromegaly or JVD noted.  No lower  extremity edema. 2/4 pulses in all 4 extremities. Respiratory: Clear to auscultation with no wheezes or rales. Good inspiratory effort. Abdomen: Soft, nontender nondistended with normal bowel sounds x4 quadrants. Muskuloskeletal: No cyanosis, clubbing or edema noted bilaterally Neuro: CN II-XII intact, strength 5/5 x 4, sensation, reflexes intact Skin: Right elbow abrasion. Psychiatry: Judgement and insight appear normal. Mood is appropriate for condition and setting          Labs on Admission:  Basic Metabolic Panel: Recent Labs  Lab 08/08/23 1505  NA 139  K 2.2*  CL 80*  CO2 43*  GLUCOSE 137*  BUN 19  CREATININE 1.04  CALCIUM 9.1  MG 1.8   Liver Function Tests: Recent Labs  Lab 08/08/23 1505  AST 29  ALT 19  ALKPHOS 114  BILITOT 1.7*  PROT 6.7  ALBUMIN 3.5   Recent Labs  Lab 08/08/23 1505  LIPASE 24   No results for input(s): "AMMONIA" in the last 168 hours. CBC: Recent Labs  Lab 08/08/23 1505  WBC 11.2*  NEUTROABS 9.7*  HGB 13.2  HCT 42.6  MCV 95.7  PLT 214   Cardiac Enzymes: No results for input(s): "CKTOTAL", "CKMB", "CKMBINDEX", "TROPONINI" in the last 168 hours.  BNP (last 3 results) Recent Labs    06/25/23 1005 07/02/23 1800 07/12/23 1113  BNP 461.0* 255.0* 104.0*    ProBNP (last 3 results) No results for input(s): "PROBNP" in the last 8760 hours.  CBG: No results for input(s): "GLUCAP" in the last 168 hours.  Radiological Exams on Admission: CT ABDOMEN PELVIS W CONTRAST Result Date: 08/08/2023 CLINICAL DATA:  Bilateral abdominal pain for 6 months, fell, hip pain EXAM: CT ABDOMEN AND PELVIS WITH CONTRAST TECHNIQUE: Multidetector CT imaging of the abdomen and pelvis was performed using the standard protocol following bolus administration of intravenous contrast. RADIATION DOSE REDUCTION: This exam was performed according to the departmental dose-optimization program which includes automated exposure control, adjustment of the mA and/or kV  according to patient size and/or use of iterative reconstruction technique. CONTRAST:  OMNIPAQUE IOHEXOL 300 MG/ML  SOLN COMPARISON:  08/08/2023, 07/12/2023 FINDINGS: Lower chest: Trace left pleural effusion with minimal left lower lobe atelectasis. No acute pleural or parenchymal lung disease. Hepatobiliary: No focal liver abnormality is seen. Status post cholecystectomy. No biliary dilatation. Pancreas: Unremarkable. No pancreatic ductal dilatation or surrounding inflammatory changes. Spleen: Normal in size without  focal abnormality. Adrenals/Urinary Tract: Adrenal glands are unremarkable. Kidneys are normal, without renal calculi, focal solid lesion, or hydronephrosis. Bladder is unremarkable. Stomach/Bowel: No bowel obstruction or ileus. Sigmoid diverticulosis without diverticulitis. No bowel wall thickening or inflammatory change. Vascular/Lymphatic: Aortic atherosclerosis. No enlarged abdominal or pelvic lymph nodes. Reproductive: Prostate is unremarkable. Other: No free fluid or free intraperitoneal gas. No abdominal wall hernia. Musculoskeletal: Unremarkable left hip arthroplasty. Stable findings of ankylosing spondylitis. Stable T10-L1 posterior fusion. Chronic fracture extending through the T11-T12 disc space and posterior elements, unchanged. No acute bony abnormalities. IMPRESSION: 1. No acute intra-abdominal or intrapelvic process. 2. Trace left pleural effusion with minimal left lower lobe atelectasis. 3. Sigmoid diverticulosis without diverticulitis. 4. Stable ankylosing spondylitis, with posterior spinal fusion spanning T10-L2. Chronic T11-T12 fracture through the fused disc space and posterior elements, unchanged since prior exam. 5.  Aortic Atherosclerosis (ICD10-I70.0). Electronically Signed   By: Sharlet Salina M.D.   On: 08/08/2023 19:04   DG HIP UNILAT WITH PELVIS 2-3 VIEWS RIGHT Result Date: 08/08/2023 CLINICAL DATA:  Status post fall with worsening hip pain EXAM: DG HIP (WITH OR  WITHOUT PELVIS) 3V RIGHT COMPARISON:  None Available. FINDINGS: Partially imaged left hip arthroplasty. Hardware appears intact and well seated. There is no evidence of hip fracture or dislocation. Degenerative changes of the right hip and partially imaged lumbar spine. Single surgical clip projects over the right lower quadrant. Gas-filled dilation of the presumed sigmoid colon. IMPRESSION: 1. No acute displaced fracture or dislocation. 2. Gas-filled dilation of the presumed sigmoid colon, likely ileus. Electronically Signed   By: Agustin Cree M.D.   On: 08/08/2023 14:36    EKG: I independently viewed the EKG done and my findings are as followed: EKG was not done in the ED  Assessment/Plan Present on Admission:  Hypokalemia  Atrial fibrillation (HCC)  Obesity (BMI 30-39.9)  Adrenal insufficiency (HCC)  Acquired hypothyroidism  Principal Problem:   Hypokalemia Active Problems:   Atrial fibrillation (HCC)   Adrenal insufficiency (HCC)   Acquired hypothyroidism   Obesity (BMI 30-39.9)   Right hip pain   Abrasion of right elbow  Hypokalemia K+ 2.2, this was replenished  Right hip pain due to a fall Continue to follow question Continue PT eval and treat  Right elbow abrasion Continue wound care  Chronic HFpEF Continue total input/output, daily weights  Continue heart healthy diet  Continue Lasix per home regimen Echocardiogram done on 07/04/2023 showed LVEF of 50 to 60%.+ RWMA.  Mild concentric LVH.  LV diastolic parameters are indeterminate  Acquired hypothyroidism Continue Synthroid  History of adrenal insufficiency/orthostatic hypotension Continue florinef and midodrine   History of atrial fibrillation Continue Eliquis for stroke prevention Patient is not on rate control medication  Obesity (BMI 30.80) Diet and lifestyle modification  GERD Continue Protonix  Mixed hyperlipidemia Continue Crestor   DVT prophylaxis: Eliquis  Code Status: Full code  Family  Communication: None at bedside  Consults: None  Severity of Illness: The appropriate patient status for this patient is OBSERVATION. Observation status is judged to be reasonable and necessary in order to provide the required intensity of service to ensure the patient's safety. The patient's presenting symptoms, physical exam findings, and initial radiographic and laboratory data in the context of their medical condition is felt to place them at decreased risk for further clinical deterioration. Furthermore, it is anticipated that the patient will be medically stable for discharge from the hospital within 2 midnights of admission.   Author: Frankey Shown, DO 08/09/2023  1:54 AM  For on call review [email or web address].

## 2023-08-09 DIAGNOSIS — S50311A Abrasion of right elbow, initial encounter: Secondary | ICD-10-CM | POA: Insufficient documentation

## 2023-08-09 DIAGNOSIS — E876 Hypokalemia: Secondary | ICD-10-CM | POA: Diagnosis not present

## 2023-08-09 DIAGNOSIS — M25551 Pain in right hip: Principal | ICD-10-CM | POA: Insufficient documentation

## 2023-08-09 LAB — COMPREHENSIVE METABOLIC PANEL WITH GFR
ALT: 16 U/L (ref 0–44)
AST: 26 U/L (ref 15–41)
Albumin: 3 g/dL — ABNORMAL LOW (ref 3.5–5.0)
Alkaline Phosphatase: 98 U/L (ref 38–126)
Anion gap: 8 (ref 5–15)
BUN: 17 mg/dL (ref 8–23)
CO2: 44 mmol/L — ABNORMAL HIGH (ref 22–32)
Calcium: 8.9 mg/dL (ref 8.9–10.3)
Chloride: 84 mmol/L — ABNORMAL LOW (ref 98–111)
Creatinine, Ser: 0.88 mg/dL (ref 0.61–1.24)
GFR, Estimated: >60 mL/min (ref >60)
Glucose, Bld: 122 mg/dL — ABNORMAL HIGH (ref 70–99)
Potassium: 2.7 mmol/L — CL (ref 3.5–5.1)
Sodium: 136 mmol/L (ref 135–145)
Total Bilirubin: 1.3 mg/dL — ABNORMAL HIGH (ref 0.0–1.2)
Total Protein: 5.8 g/dL — ABNORMAL LOW (ref 6.5–8.1)

## 2023-08-09 LAB — PHOSPHORUS: Phosphorus: 2.3 mg/dL — ABNORMAL LOW (ref 2.5–4.6)

## 2023-08-09 LAB — CBC
HCT: 36.7 % — ABNORMAL LOW (ref 39.0–52.0)
Hemoglobin: 11.7 g/dL — ABNORMAL LOW (ref 13.0–17.0)
MCH: 30.3 pg (ref 26.0–34.0)
MCHC: 31.9 g/dL (ref 30.0–36.0)
MCV: 95.1 fL (ref 80.0–100.0)
Platelets: 190 10*3/uL (ref 150–400)
RBC: 3.86 MIL/uL — ABNORMAL LOW (ref 4.22–5.81)
RDW: 15.4 % (ref 11.5–15.5)
WBC: 9.2 10*3/uL (ref 4.0–10.5)
nRBC: 0.2 % (ref 0.0–0.2)

## 2023-08-09 LAB — MAGNESIUM: Magnesium: 2 mg/dL (ref 1.7–2.4)

## 2023-08-09 LAB — MRSA NEXT GEN BY PCR, NASAL: MRSA by PCR Next Gen: NOT DETECTED

## 2023-08-09 MED ORDER — ONDANSETRON HCL 4 MG/2ML IJ SOLN: 4.0000 mg | Freq: Four times a day (QID) | INTRAMUSCULAR | Status: DC | PRN

## 2023-08-09 MED ORDER — FLUDROCORTISONE ACETATE 0.1 MG PO TABS
0.1000 mg | ORAL_TABLET | Freq: Every day | ORAL | Status: DC
  Administered 2023-08-09 – 2023-08-13 (×5): 0.1 mg via ORAL
  Filled 2023-08-09 (×7): qty 1

## 2023-08-09 MED ORDER — POTASSIUM CHLORIDE CRYS ER 20 MEQ PO TBCR
40.0000 meq | EXTENDED_RELEASE_TABLET | ORAL | Status: AC
  Administered 2023-08-09 (×2): 40 meq via ORAL
  Filled 2023-08-09 (×2): qty 2

## 2023-08-09 MED ORDER — LEVOTHYROXINE SODIUM 100 MCG PO TABS
100.0000 ug | ORAL_TABLET | Freq: Every day | ORAL | Status: DC
  Administered 2023-08-09 – 2023-08-13 (×5): 100 ug via ORAL
  Filled 2023-08-09 (×5): qty 1

## 2023-08-09 MED ORDER — ONDANSETRON HCL 4 MG PO TABS: 4.0000 mg | ORAL_TABLET | Freq: Four times a day (QID) | ORAL | Status: DC | PRN

## 2023-08-09 MED ORDER — POTASSIUM CHLORIDE CRYS ER 20 MEQ PO TBCR
40.0000 meq | EXTENDED_RELEASE_TABLET | Freq: Once | ORAL | Status: AC
  Administered 2023-08-09: 40 meq via ORAL
  Filled 2023-08-09: qty 2

## 2023-08-09 MED ORDER — ROSUVASTATIN CALCIUM 10 MG PO TABS
5.0000 mg | ORAL_TABLET | Freq: Every day | ORAL | Status: DC
  Administered 2023-08-09 – 2023-08-12 (×4): 5 mg via ORAL
  Filled 2023-08-09 (×4): qty 1

## 2023-08-09 MED ORDER — SENNOSIDES-DOCUSATE SODIUM 8.6-50 MG PO TABS
2.0000 | ORAL_TABLET | Freq: Every day | ORAL | Status: DC
  Administered 2023-08-09 – 2023-08-12 (×4): 2 via ORAL
  Filled 2023-08-09 (×4): qty 2

## 2023-08-09 MED ORDER — PANTOPRAZOLE SODIUM 40 MG PO TBEC
40.0000 mg | DELAYED_RELEASE_TABLET | Freq: Every day | ORAL | Status: DC
  Administered 2023-08-09 – 2023-08-13 (×5): 40 mg via ORAL
  Filled 2023-08-09 (×5): qty 1

## 2023-08-09 MED ORDER — MIDODRINE HCL 5 MG PO TABS
10.0000 mg | ORAL_TABLET | Freq: Two times a day (BID) | ORAL | Status: DC
  Administered 2023-08-09 – 2023-08-12 (×8): 10 mg via ORAL
  Filled 2023-08-09 (×9): qty 2

## 2023-08-09 MED ORDER — POLYETHYLENE GLYCOL 3350 17 G PO PACK
17.0000 g | PACK | Freq: Two times a day (BID) | ORAL | Status: DC
  Administered 2023-08-09 – 2023-08-11 (×5): 17 g via ORAL
  Filled 2023-08-09 (×7): qty 1

## 2023-08-09 MED ORDER — ACETAMINOPHEN 650 MG RE SUPP: 650.0000 mg | Freq: Four times a day (QID) | RECTAL | Status: DC | PRN

## 2023-08-09 MED ORDER — APIXABAN 5 MG PO TABS
5.0000 mg | ORAL_TABLET | Freq: Two times a day (BID) | ORAL | Status: DC
  Administered 2023-08-09 – 2023-08-13 (×9): 5 mg via ORAL
  Filled 2023-08-09 (×9): qty 1

## 2023-08-09 MED ORDER — POTASSIUM PHOSPHATES 15 MMOLE/5ML IV SOLN
15.0000 mmol | Freq: Once | INTRAVENOUS | Status: AC
  Administered 2023-08-09: 15 mmol via INTRAVENOUS
  Filled 2023-08-09: qty 5

## 2023-08-09 MED ORDER — CHLORHEXIDINE GLUCONATE CLOTH 2 % EX PADS
6.0000 | MEDICATED_PAD | Freq: Every day | CUTANEOUS | Status: DC
  Administered 2023-08-09 – 2023-08-13 (×5): 6 via TOPICAL

## 2023-08-09 MED ORDER — ACETAMINOPHEN 325 MG PO TABS
650.0000 mg | ORAL_TABLET | Freq: Four times a day (QID) | ORAL | Status: DC | PRN
  Administered 2023-08-10: 650 mg via ORAL
  Filled 2023-08-09: qty 2

## 2023-08-09 MED ORDER — VITAMIN B-12 1000 MCG PO TABS
1000.0000 ug | ORAL_TABLET | Freq: Every day | ORAL | Status: DC
  Administered 2023-08-09 – 2023-08-13 (×5): 1000 ug via ORAL
  Filled 2023-08-09 (×6): qty 1

## 2023-08-09 MED ORDER — FUROSEMIDE 20 MG PO TABS
20.0000 mg | ORAL_TABLET | Freq: Every day | ORAL | Status: DC
  Administered 2023-08-09 – 2023-08-13 (×5): 20 mg via ORAL
  Filled 2023-08-09 (×5): qty 1

## 2023-08-09 NOTE — Plan of Care (Signed)

## 2023-08-09 NOTE — Plan of Care (Signed)
  Problem: Acute Rehab PT Goals(only PT should resolve) Goal: Pt Will Go Supine/Side To Sit Outcome: Progressing Flowsheets (Taken 08/09/2023 1226) Pt will go Supine/Side to Sit: Independently Goal: Patient Will Transfer Sit To/From Stand Outcome: Progressing Flowsheets (Taken 08/09/2023 1226) Patient will transfer sit to/from stand: with modified independence Goal: Pt Will Transfer Bed To Chair/Chair To Bed Outcome: Progressing Flowsheets (Taken 08/09/2023 1226) Pt will Transfer Bed to Chair/Chair to Bed: with supervision Goal: Pt Will Ambulate Outcome: Progressing Flowsheets (Taken 08/09/2023 1226) Pt will Ambulate:  50 feet  with rolling walker  with supervision Goal: Pt Will Go Up/Down Stairs Outcome: Progressing Flowsheets (Taken 08/09/2023 1226) Pt will Go Up / Down Stairs:  with contact guard assist  3-5 stairs  with rail(s)    12:27 PM, 08/09/23 Chryl Heck, PT, DPT [place] with [facility]

## 2023-08-09 NOTE — TOC Initial Note (Signed)
 Transition of Care [facility]) - Initial/Assessment Note    Patient Details  Name: Roy [name] MRN: [id number] Date of Birth: 10/07/1950  [facility]) CM/SW Contact:    Villa Herb, LCSWA Phone Number: 08/09/2023, 11:57 AM  Clinical Narrative:                 CSW updated that PT is recommending SNF for pt at D/C. CSW spoke with pt at bedside about SNF referral being sent out, pt is agreeable. He prefers [facility] if possible but is also agreeable to [facility] rehab. CSW to complete referral and send out for review. TOC to follow.   Expected Discharge Plan: Skilled Nursing Facility Barriers to Discharge: Continued Medical Work up   Patient Goals and CMS Choice Patient states their goals for this hospitalization and ongoing recovery are:: go to SNF CMS Medicare.gov Compare Post Acute Care list provided to:: Patient Choice offered to / list presented to : Patient      Expected Discharge Plan and Services In-house Referral: Clinical Social Work Discharge Planning Services: CM Consult Post Acute Care Choice: Skilled Nursing Facility Living arrangements for the past 2 months: Single Family Home                                      Prior Living Arrangements/Services Living arrangements for the past 2 months: Single Family Home Lives with:: Self Patient language and need for interpreter reviewed:: Yes Do you feel safe going back to the place where you live?: Yes      Need for Family Participation in Patient Care: Yes (Comment) Care giver support system in place?: Yes (comment) Current home services: DME Criminal Activity/Legal Involvement Pertinent to Current Situation/Hospitalization: No - Comment as needed  Activities of Daily Living   ADL Screening (condition at time of admission) Independently performs ADLs?: Yes (appropriate for developmental age) Is the patient deaf or have difficulty hearing?: No Does the patient have difficulty seeing, even when wearing  glasses/contacts?: No Does the patient have difficulty concentrating, remembering, or making decisions?: No  Permission Sought/Granted                  Emotional Assessment Appearance:: Appears stated age Attitude/Demeanor/Rapport: Engaged Affect (typically observed): Accepting Orientation: : Oriented to Self, Oriented to Place, Oriented to  Time, Oriented to Situation Alcohol / Substance Use: Not Applicable Psych Involvement: No (comment)  Admission diagnosis:  Hypokalemia [E87.6] Weakness [R53.1] Right hip pain [M25.551] Patient Active Problem List   Diagnosis Date Noted   Right hip pain 08/09/2023   Abrasion of right elbow 08/09/2023   CAP (community acquired pneumonia) due to influenza A virus 07/02/2023   CAP (community acquired pneumonia) 07/02/2023   Acute hypoxic respiratory failure (HCC) 06/25/2023   Acute on chronic heart failure with preserved ejection fraction (HFpEF) (HCC) 06/25/2023   GI bleed 06/25/2023   Benign hypertension with coincident congestive heart failure (HCC) 06/10/2023   Chronic constipation 06/10/2023   Vitamin B 12 deficiency 06/10/2023   Hypomagnesuria 06/10/2023   Peripheral neuropathy 06/10/2023   Aortic atherosclerosis (HCC) 06/10/2023   T11 vertebral fracture (HCC) 05/24/2023   Urge incontinence 11/05/2022   Symptomatic bradycardia 09/05/2022   Acute congestive heart failure (HCC) 09/05/2022   Parotid neoplasm 12/28/2021   Mononeuropathy of left lower limb 12/28/2021   Neutropenic fever (HCC) 07/14/2021   Acquired hypothyroidism 07/14/2021   Hypotension 07/14/2021   Hyperlipidemia  07/14/2021   BPH (benign prostatic hyperplasia) 07/14/2021   GERD (gastroesophageal reflux disease) 07/14/2021   Leukopenia    Thrombocytopenia (HCC)    Large B-cell lymphoma (HCC) 03/22/2021   Varicose veins of bilateral lower extremities with pain 02/02/2021   Venous (peripheral) insufficiency 02/02/2021   Subacute thyroiditis 01/20/2021   Adrenal  insufficiency (HCC) 01/10/2021   Hyponatremia 01/08/2021   Weakness 01/08/2021   Nonrheumatic aortic valve stenosis 12/20/2020   Hyperthyroidism 10/22/2020   Coagulopathy (HCC) 10/21/2020   Obesity (BMI 30-39.9) 05/04/2015   Chronic anticoagulation 05/04/2015   Trifascicular block 04/29/2015   LAFB (left anterior fascicular block) 04/29/2015   Essential hypertension    Hypokalemia 04/28/2015   RBBB 04/28/2015   First degree AV block 04/28/2015   Atrial fibrillation (HCC) 04/27/2015   PCP:  Rebecka Apley, NP Pharmacy:   [facility] [place], [place] - [address] [place] [address] Phone: [phone] Fax: [phone]  [facility] - [place], [place] - [address] [place] [address] Phone: [phone] Fax: [phone]     Social Drivers of Health (SDOH) Social History: SDOH Screenings   Food Insecurity: No Food Insecurity (08/08/2023)  Housing: Low Risk  (08/08/2023)  Transportation Needs: No Transportation Needs (08/08/2023)  Utilities: Not At Risk (08/08/2023)  Alcohol Screen: Low Risk  (11/08/2020)  Depression (PHQ2-9): Low Risk  (11/08/2020)  Financial Resource Strain: Low Risk  (05/15/2023)   Received from [facility]  Physical Activity: Unknown (04/25/2023)   Received from [facility]  Recent Concern: Physical Activity - Inactive (04/25/2023)   Received from [facility]  Social Connections: Moderately Isolated (08/08/2023)  Stress: No Stress Concern Present (04/25/2023)   Received from [facility]  Tobacco Use: Low Risk  (08/08/2023)   SDOH Interventions:     Readmission Risk Interventions    07/03/2023    8:45 AM 06/28/2023    7:56 AM 07/17/2021   12:49 PM  Readmission Risk Prevention Plan  Transportation Screening Complete Complete Complete  HRI or Home Care Consult Complete Complete Complete  Social Work Consult for Recovery Care Planning/Counseling Complete Complete  Complete  Palliative Care Screening Not Applicable Not Applicable Not Applicable  Medication Review Oceanographer) Complete Complete Complete

## 2023-08-09 NOTE — [place] FL2 (Signed)
 [place] MEDICAID FL2 LEVEL OF CARE FORM     IDENTIFICATION  Patient Name: Roy [name] Birthdate: 12-28-1950 Sex: male Admission Date (Current Location): 08/08/2023  [facility] and [place] Number:  [employer] and Address:  [facility],  [address], [place] [address]      Provider Number: [id number]  Attending Physician Name and Address:  Shon Hale, MD  Relative Name and Phone Number:       Current Level of Care: Hospital Recommended Level of Care: Skilled Nursing Facility Prior Approval Number:    Date Approved/Denied:   PASRR Number: [id number]  Discharge Plan: SNF    Current Diagnoses: Patient Active Problem List   Diagnosis Date Noted   Right hip pain 08/09/2023   Abrasion of right elbow 08/09/2023   CAP (community acquired pneumonia) due to influenza A virus 07/02/2023   CAP (community acquired pneumonia) 07/02/2023   Acute hypoxic respiratory failure (HCC) 06/25/2023   Acute on chronic heart failure with preserved ejection fraction (HFpEF) (HCC) 06/25/2023   GI bleed 06/25/2023   Benign hypertension with coincident congestive heart failure (HCC) 06/10/2023   Chronic constipation 06/10/2023   Vitamin B 12 deficiency 06/10/2023   Hypomagnesuria 06/10/2023   Peripheral neuropathy 06/10/2023   Aortic atherosclerosis (HCC) 06/10/2023   T11 vertebral fracture (HCC) 05/24/2023   Urge incontinence 11/05/2022   Symptomatic bradycardia 09/05/2022   Acute congestive heart failure (HCC) 09/05/2022   Parotid neoplasm 12/28/2021   Mononeuropathy of left lower limb 12/28/2021   Neutropenic fever (HCC) 07/14/2021   Acquired hypothyroidism 07/14/2021   Hypotension 07/14/2021   Hyperlipidemia 07/14/2021   BPH (benign prostatic hyperplasia) 07/14/2021   GERD (gastroesophageal reflux disease) 07/14/2021   Leukopenia    Thrombocytopenia (HCC)    Large B-cell lymphoma (HCC) 03/22/2021   Varicose veins of bilateral lower extremities  with pain 02/02/2021   Venous (peripheral) insufficiency 02/02/2021   Subacute thyroiditis 01/20/2021   Adrenal insufficiency (HCC) 01/10/2021   Hyponatremia 01/08/2021   Weakness 01/08/2021   Nonrheumatic aortic valve stenosis 12/20/2020   Hyperthyroidism 10/22/2020   Coagulopathy (HCC) 10/21/2020   Obesity (BMI 30-39.9) 05/04/2015   Chronic anticoagulation 05/04/2015   Trifascicular block 04/29/2015   LAFB (left anterior fascicular block) 04/29/2015   Essential hypertension    Hypokalemia 04/28/2015   RBBB 04/28/2015   First degree AV block 04/28/2015   Atrial fibrillation (HCC) 04/27/2015    Orientation RESPIRATION BLADDER Height & Weight     Self, Time, Situation, Place  O2 (1L) Continent Weight: 258 lb 9.6 oz (117.3 kg) Height:  6' (182.9 cm)  BEHAVIORAL SYMPTOMS/MOOD NEUROLOGICAL BOWEL NUTRITION STATUS      Continent Diet (heart healthy)  AMBULATORY STATUS COMMUNICATION OF NEEDS Skin   Extensive Assist Verbally Other (Comment) (Sacrum medial)                       Personal Care Assistance Level of Assistance  Bathing, Feeding, Dressing Bathing Assistance: Limited assistance Feeding assistance: Independent Dressing Assistance: Limited assistance     Functional Limitations Info  Sight, Hearing, Speech Sight Info: Adequate Hearing Info: Adequate Speech Info: Adequate    SPECIAL CARE FACTORS FREQUENCY  PT (By licensed PT), OT (By licensed OT)     PT Frequency: 5 times weekly OT Frequency: 5 times weekly            Contractures Contractures Info: Not present    Additional Factors Info  Code Status, Allergies Code Status Info: FULL Allergies  Info: Sulfa Antibiotics, Adhesive (Tape)           Current Medications (08/09/2023):  This is the current hospital active medication list Current Facility-Administered Medications  Medication Dose Route Frequency Provider Last Rate Last Admin   acetaminophen (TYLENOL) tablet 650 mg  650 mg Oral Q6H PRN  Adefeso, Oladapo, DO       Or   acetaminophen (TYLENOL) suppository 650 mg  650 mg Rectal Q6H PRN Adefeso, Oladapo, DO       apixaban (ELIQUIS) tablet 5 mg  5 mg Oral BID Adefeso, Oladapo, DO   5 mg at 08/09/23 1018   Chlorhexidine Gluconate Cloth 2 % PADS 6 each  6 each Topical Daily Adefeso, Oladapo, DO   6 each at 08/09/23 1018   cyanocobalamin (VITAMIN B12) tablet 1,000 mcg  1,000 mcg Oral Daily Adefeso, Oladapo, DO   1,000 mcg at 08/09/23 1018   fludrocortisone (FLORINEF) tablet 0.1 mg  0.1 mg Oral Daily Adefeso, Oladapo, DO   0.1 mg at 08/09/23 1018   furosemide (LASIX) tablet 20 mg  20 mg Oral Daily Adefeso, Oladapo, DO   20 mg at 08/09/23 1018   levothyroxine (SYNTHROID) tablet 100 mcg  100 mcg Oral Q0600 Adefeso, Oladapo, DO   100 mcg at 08/09/23 0623   midodrine (PROAMATINE) tablet 10 mg  10 mg Oral BID WC Adefeso, Oladapo, DO   10 mg at 08/09/23 0744   ondansetron (ZOFRAN) tablet 4 mg  4 mg Oral Q6H PRN Adefeso, Oladapo, DO       Or   ondansetron (ZOFRAN) injection 4 mg  4 mg Intravenous Q6H PRN Adefeso, Oladapo, DO       oxyCODONE (Oxy IR/ROXICODONE) immediate release tablet 5 mg  5 mg Oral Q4H PRN Adefeso, Oladapo, DO   5 mg at 08/09/23 0029   pantoprazole (PROTONIX) EC tablet 40 mg  40 mg Oral Daily Adefeso, Oladapo, DO   40 mg at 08/09/23 1018   potassium chloride SA (KLOR-CON M) CR tablet 40 mEq  40 mEq Oral Q3H Emokpae, Courage, MD   40 mEq at 08/09/23 1610   potassium PHOSPHATE 15 mmol in dextrose 5 % 250 mL infusion  15 mmol Intravenous Once Emokpae, Courage, MD 43 mL/hr at 08/09/23 1057 15 mmol at 08/09/23 1057   rosuvastatin (CRESTOR) tablet 5 mg  5 mg Oral QHS Adefeso, Oladapo, DO         Discharge Medications: Please see discharge summary for a list of discharge medications.  Relevant Imaging Results:  Relevant Lab Results:   Additional Information SSN: [id number], [place]

## 2023-08-09 NOTE — Care Management Obs Status (Signed)
 MEDICARE OBSERVATION STATUS NOTIFICATION   Patient Details  Name: Roy [name] MRN: [id number] Date of Birth: August 20, 1950   Medicare Observation Status Notification Given:  Yes    Corey Harold 08/09/2023, 5:09 PM

## 2023-08-09 NOTE — Progress Notes (Signed)
 Report given to [employer]. Pt progressing. Did well with PT. Replaced potassium x2.

## 2023-08-09 NOTE — TOC Progression Note (Signed)
 Transition of Care [facility]) - Progression Note    Patient Details  Name: RISHI VICARIO MRN: [id number] Date of Birth: 1950/12/28  [facility]) CM/SW Contact  Elliot Gault, LCSW Phone Number: 08/09/2023, 3:34 PM  Clinical Narrative:     Reviewed SNF bed offer with pt who states he just wants to go home with [facility]. Per pt he was set up with [facility] when he left [facility] but he does not remember what agency.  Called Debbie at [facility] who states it was set up with [facility]. Updated Clydie Braun at [employer] [facility]. They can continue to follow pt at dc.   Updated MD. Will need new HH orders at dc. Weekend TOC will follow.  Expected Discharge Plan: Skilled Nursing Facility Barriers to Discharge: Continued Medical Work up  Expected Discharge Plan and Services In-house Referral: Clinical Social Work Discharge Planning Services: CM Consult Post Acute Care Choice: Skilled Nursing Facility Living arrangements for the past 2 months: Single Family Home                                       Social Determinants of Health (SDOH) Interventions SDOH Screenings   Food Insecurity: No Food Insecurity (08/08/2023)  Housing: Low Risk  (08/08/2023)  Transportation Needs: No Transportation Needs (08/08/2023)  Utilities: Not At Risk (08/08/2023)  Alcohol Screen: Low Risk  (11/08/2020)  Depression (PHQ2-9): Low Risk  (11/08/2020)  Financial Resource Strain: Low Risk  (05/15/2023)   Received from [facility]  Physical Activity: Unknown (04/25/2023)   Received from [facility]  Recent Concern: Physical Activity - Inactive (04/25/2023)   Received [facility]  Social Connections: Moderately Isolated (08/08/2023)  Stress: No Stress Concern Present (04/25/2023)   Received from [facility]  Tobacco Use: Low Risk  (08/08/2023)    Readmission Risk Interventions    07/03/2023    8:45 AM 06/28/2023    7:56 AM 07/17/2021   12:49 PM  Readmission Risk Prevention Plan  Transportation Screening  Complete Complete Complete  HRI or Home Care Consult Complete Complete Complete  Social Work Consult for Recovery Care Planning/Counseling Complete Complete Complete  Palliative Care Screening Not Applicable Not Applicable Not Applicable  Medication Review Ocean[employer]) Complete Complete Complete

## 2023-08-09 NOTE — Evaluation (Signed)
 Physical Therapy Evaluation Patient Details Name: Roy [name] MRN: [id number] DOB: 1950/12/21 Today's Date: 08/09/2023  History of Present Illness  Roy [name] is a 73 y.o. male with medical history significant of atrial fibrillation maintained on Eliquis, adrenal insufficiency, diffuse large B-cell lymphoma, GERD, hyperlipidemia, hypertension who presents to the emergency department due to complaints of right hip pain.  Patient endorsed sustaining a fall at home on Saturday (4/12), he states that he stumbled and hit against the wall while using his walker, he sustained right elbow abrasion and has been complaining of right hip pain since then.  Patient was recently admitted from 2/25 to 3/5 due to community-acquired pneumonia and acute on chronic  HFpEF, was discharged to SNF was just recently discharged from SNF to home last week and had been requiring minimal assistance.  However, yesterday, he had difficulty in being able to lift his right leg or put weight on it, so he decided to go to the ED for further evaluation and management..  Patient denies chest pain, shortness of breath, fever or chills.   Clinical Impression  Patient tolerated PT evaluation fairly well. Limited mostly by pain and fatigue. Patient with recent discharge from SNF and returned home, where he reports a fall from tripping over Oxygen wiring. Patient reports once home, he was requiring assistance with ADLs and iADLs and use of RW for ambulation as well as O2 1 Lpm at baseline. On this date, he required min A-CGA with RW for functional transfers and ambulation. Mod(I) for bed mobility. Patient with very labored movement, dec velocity, and impaired balance and antalgic gait due to increased pain putting patient at increased fall risk. Patient will benefit from continued skilled physical therapy acutely and in SNF setting prior to returning home in order to address the above.        If plan is discharge home, recommend the  following: A little help with walking and/or transfers;A lot of help with bathing/dressing/bathroom   Can travel by private vehicle   Yes    Equipment Recommendations None recommended by PT  Recommendations for Other Services       Functional Status Assessment Patient has had a recent decline in their functional status and demonstrates the ability to make significant improvements in function in a reasonable and predictable amount of time.     Precautions / Restrictions Precautions Precautions: Fall Restrictions Weight Bearing Restrictions Per Provider Order: No      Mobility  Bed Mobility Overal bed mobility: Modified Independent             General bed mobility comments: HOB elevated and bed height elevated. Pt reporting unable to sleep in bed the past few days has been in recliner. Use of Railing and inc time to complete.    Transfers Overall transfer level: Needs assistance Equipment used: Rolling walker (2 wheels) Transfers: Sit to/from Stand, Bed to chair/wheelchair/BSC Sit to Stand: Min assist, Contact guard assist   Step pivot transfers: Contact guard assist       General transfer comment: Pt w/ min A for STS from elevated hospital bed and BSC 2/2 pain w/ WB. Labored movement and inc time needed    Ambulation/Gait Ambulation/Gait assistance: Contact guard assist Gait Distance (Feet): 15 Feet Assistive device: Rolling walker (2 wheels) Gait Pattern/deviations: Step-to pattern, Decreased stance time - right, Decreased step length - left, Decreased step length - right, Trunk flexed, Antalgic Gait velocity: Decreased     General Gait Details: Pt demo antalgic  gait pattern w/ the above deficits. Reports increased pain with every step on RLE. Very reliant on RW and LLE.  Stairs            Wheelchair Mobility     Tilt Bed    Modified Rankin (Stroke Patients Only)       Balance Overall balance assessment: Mild deficits observed, not formally  tested (Pt demo good sitting balance once EOB with both feet supported and with varying UE support. Reliant on AD, and CGA for standing balance during functional ambulation)           Pertinent Vitals/Pain Pain Assessment Pain Assessment: No/denies pain (Denies pain at start of session but reports 5/10 pain in R groin region, R knee and down R shin area.)    Home Living Family/patient expects to be discharged to:: Private residence Living Arrangements: Alone Available Help at Discharge: Neighbor;Available PRN/intermittently Type of Home: House Home Access: Stairs to enter Entrance Stairs-Rails: Left;Right;Can reach both Entrance Stairs-Number of Steps: 3   Home Layout: One level Home Equipment: Agricultural consultant (2 wheels);Cane - single point;BSC/3in1 Additional Comments: Pt reporting from home. was just d/c from SNF days prior to    Prior Function Prior Level of Function : Needs assist       Physical Assist : ADLs (physical)   ADLs (physical): Bathing;Dressing;Toileting;IADLs Mobility Comments: Using RW for ambulation. ADLs Comments: Reports he was receiving some help at home from son and daughter in law for ADLs since d/c from SNF.     Extremity/Trunk Assessment   Upper Extremity Assessment Upper Extremity Assessment: Overall WFL for tasks assessed;Generalized weakness    Lower Extremity Assessment Lower Extremity Assessment: Overall WFL for tasks assessed;Generalized weakness;RLE deficits/detail RLE Deficits / Details: Pt unable to fully bear weight on RLE 2/2 pain. Adequate strength and ROM for functional tasks but generally weak. RLE Coordination: decreased gross motor;decreased fine motor    Cervical / Trunk Assessment Cervical / Trunk Assessment: Kyphotic  Communication   Communication Communication: No apparent difficulties    Cognition Arousal: Alert Behavior During Therapy: WFL for tasks assessed/performed   PT - Cognitive impairments: No apparent  impairments           Following commands: Intact       Cueing Cueing Techniques: Verbal cues     General Comments      Exercises     Assessment/Plan    PT Assessment Patient needs continued PT services;All further PT needs can be met in the next venue of care  PT Problem List Decreased strength;Decreased range of motion;Decreased activity tolerance;Decreased balance;Decreased mobility;Pain       PT Treatment Interventions Gait training;DME instruction;Stair training;Functional mobility training;Therapeutic activities;Therapeutic exercise;Balance training;Patient/family education    PT Goals (Current goals can be found in the Care Plan section)  Acute Rehab PT Goals Patient Stated Goal: Return home with appropriate support PT Goal Formulation: With patient Time For Goal Achievement: 08/23/23 Potential to Achieve Goals: Good    Frequency Min 3X/week     Co-evaluation               AM-PAC PT "6 Clicks" Mobility  Outcome Measure Help needed turning from your back to your side while in a flat bed without using bedrails?: None Help needed moving from lying on your back to sitting on the side of a flat bed without using bedrails?: A Little Help needed moving to and from a bed to a chair (including a wheelchair)?: A Little Help needed standing up  from a chair using your arms (e.g., wheelchair or bedside chair)?: A Little Help needed to walk in hospital room?: A Little Help needed climbing 3-5 steps with a railing? : A Lot 6 Click Score: 18    End of Session Equipment Utilized During Treatment: Gait belt;Oxygen ([place] 1 Lpm at start and end of session. 3 Lpm during moblity.) Activity Tolerance: Patient tolerated treatment well;Patient limited by pain;Patient limited by fatigue Patient left: in chair;with call bell/phone within reach Nurse Communication: Mobility status PT Visit Diagnosis: Unsteadiness on feet (R26.81);Other abnormalities of gait and mobility  (R26.89);History of falling (Z91.81);Difficulty in walking, not elsewhere classified (R26.2);Pain Pain - Right/Left: Right Pain - part of body: Leg;Knee    Time: 1308-6578 PT Time Calculation (min) (ACUTE ONLY): 35 min   Charges:   PT Evaluation $PT Eval Moderate Complexity: 1 Mod PT Treatments $Gait Training: 8-22 mins $Therapeutic Activity: 8-22 mins PT General Charges $$ ACUTE PT VISIT: 1 Visit         12:24 PM, 08/09/23 Milee Qualls Powell-Butler, PT, DPT [place] with [facility]

## 2023-08-09 NOTE — Progress Notes (Signed)
 PROGRESS NOTE     Roy [name], is a 73 y.o. male, DOB - 17-Mar-1951, [id number]  Admit date - 08/08/2023   Admitting Physician Roy Shown, DO  Outpatient Primary MD for the patient is Hemberg, Roy Cola, NP  LOS - 0  Chief Complaint  Patient presents with   Hip Pain        Brief Narrative:  73 y.o. male with medical history significant of atrial fibrillation maintained on Eliquis, adrenal insufficiency, diffuse large B-cell lymphoma, GERD, hyperlipidemia, hypertension admitted after falling and found to have A-fib in the setting of severe electrolyte abnormalities including severe hypokalemia and hypophosphatemia    -Assessment and Plan: 1) status post fall with right hip pain--- syncope unlikely -Physical therapy eval appreciated recommends SNF rehab  2) persistent severe hypokalemia and hypophosphatemia--- despite replacement potassium remains low -Suspect this is due to Florinef and furosemide use PTA -- Continue to replace and recheck electrolytes  3)History of adrenal insufficiency/orthostatic hypotension -Orthostatic hypotension may have contributed to the fall Continue florinef and midodrine   4) chronic diastolic dysfunction CHF--stable, -Echo from 07/04/2023 with EF of 55 to 60% -Continue Lasix  5) chronic atrial fibrillation--continue Eliquis.  Prophylaxis -Currently not on rate control medications  6)Hypothyroidism--continue levothyroxine  7)GERD--continue Protonix  8)HLD--stable, continue Cresto  Status is: Inpatient   Disposition: The patient is from: Home              Anticipated d/c is to: SNF              Anticipated d/c date is: 1 day              Patient currently is not medically stable to d/c. Barriers: Not Clinically Stable-   Code Status :  -  Code Status: Full Code   Family Communication:    NA (patient is alert, awake and coherent)   DVT Prophylaxis  :   - SCDs   SCDs Start: 08/09/23 0209 apixaban (ELIQUIS) tablet 5 mg    Lab Results  Component Value Date   PLT 190 08/09/2023    Inpatient Medications  Scheduled Meds:  apixaban  5 mg Oral BID   Chlorhexidine Gluconate Cloth  6 each Topical Daily   cyanocobalamin  1,000 mcg Oral Daily   fludrocortisone  0.1 mg Oral Daily   furosemide  20 mg Oral Daily   levothyroxine  100 mcg Oral Q0600   midodrine  10 mg Oral BID WC   pantoprazole  40 mg Oral Daily   polyethylene glycol  17 g Oral BID   rosuvastatin  5 mg Oral QHS   senna-docusate  2 tablet Oral QHS   Continuous Infusions: PRN Meds:.acetaminophen **OR** acetaminophen, ondansetron **OR** ondansetron (ZOFRAN) IV, oxyCODONE   Anti-infectives (From admission, onward)    None      Subjective: [name] today has no fevers, no emesis,  No chest pain,   - Voiding okay -Oral intake is fair -Denies dizziness   Objective: Vitals:   08/09/23 1300 08/09/23 1400 08/09/23 1507 08/09/23 1543  BP: (!) 105/57 (!) 93/59 (!) 96/59   Pulse: (!) 59 60 60   Resp: 14 15 18    Temp:    97.9 F (36.6 C)  TempSrc:    Oral  SpO2: 100% 98% 97%   Weight:      Height:        Intake/Output Summary (Last 24 hours) at 08/09/2023 1915 Last data filed at 08/09/2023 1745 Gross per 24 hour  Intake 432.83  ml  Output 700 ml  Net -267.17 ml   Filed Weights   08/08/23 1304 08/09/23 0500  Weight: 103 kg 117.3 kg    Physical Exam Gen:- Awake Alert, in no acute distress HEENT:- [place].AT, No sclera icterus Neck-Supple Neck,No JVD,.  Lungs-  CTAB , fair symmetrical air movement CV- S1, S2 normal, irregularly irregular Abd-  +ve B.Sounds, Abd Soft, No tenderness,    Extremity/Skin:- No significant edema, pedal pulses present  Psych-affect is appropriate, oriented x3 Neuro-no new focal deficits, no tremors  Data Reviewed: I have personally reviewed following labs and imaging studies  CBC: Recent Labs  Lab 08/08/23 1505 08/09/23 0412  WBC 11.2* 9.2  NEUTROABS 9.7*  --   HGB 13.2 11.7*  HCT 42.6 36.7*   MCV 95.7 95.1  PLT 214 190   Basic Metabolic Panel: Recent Labs  Lab 08/08/23 1505 08/09/23 0412  NA 139 136  K 2.2* 2.7*  CL 80* 84*  CO2 43* 44*  GLUCOSE 137* 122*  BUN 19 17  CREATININE 1.04 0.88  CALCIUM 9.1 8.9  MG 1.8 2.0  PHOS  --  2.3*   GFR: Estimated Creatinine Clearance: 98.9 mL/min (by C-G formula based on SCr of 0.88 mg/dL). Liver Function Tests: Recent Labs  Lab 08/08/23 1505 08/09/23 0412  AST 29 26  ALT 19 16  ALKPHOS 114 98  BILITOT 1.7* 1.3*  PROT 6.7 5.8*  ALBUMIN 3.5 3.0*   Recent Results (from the past 240 hours)  MRSA Next Gen by PCR, Nasal     Status: None   Collection Time: 08/09/23 12:02 AM   Specimen: Nasal Mucosa; Nasal Swab  Result Value Ref Range Status   MRSA by PCR Next Gen NOT DETECTED NOT DETECTED Final    Comment: (NOTE) The GeneXpert MRSA Assay (FDA approved for NASAL specimens only), is one component of a comprehensive MRSA colonization surveillance program. It is not intended to diagnose MRSA infection nor to guide or monitor treatment for MRSA infections. Test performance is not FDA approved in patients less than 31 years old. Performed at [facility], [address], [place], [place] [address]     Radiology Studies: CT ABDOMEN PELVIS W CONTRAST Result Date: 08/08/2023 CLINICAL DATA:  Bilateral abdominal pain for 6 months, fell, hip pain EXAM: CT ABDOMEN AND PELVIS WITH CONTRAST TECHNIQUE: Multidetector CT imaging of the abdomen and pelvis was performed using the standard protocol following bolus administration of intravenous contrast. RADIATION DOSE REDUCTION: This exam was performed according to the departmental dose-optimization program which includes automated exposure control, adjustment of the mA and/or kV according to patient size and/or use of iterative reconstruction technique. CONTRAST:  OMNIPAQUE IOHEXOL 300 MG/ML  SOLN COMPARISON:  08/08/2023, 07/12/2023 FINDINGS: Lower chest: Trace left pleural effusion with  minimal left lower lobe atelectasis. No acute pleural or parenchymal lung disease. Hepatobiliary: No focal liver abnormality is seen. Status post cholecystectomy. No biliary dilatation. Pancreas: Unremarkable. No pancreatic ductal dilatation or surrounding inflammatory changes. Spleen: Normal in size without focal abnormality. Adrenals/Urinary Tract: Adrenal glands are unremarkable. Kidneys are normal, without renal calculi, focal solid lesion, or hydronephrosis. Bladder is unremarkable. Stomach/Bowel: No bowel obstruction or ileus. Sigmoid diverticulosis without diverticulitis. No bowel wall thickening or inflammatory change. Vascular/Lymphatic: Aortic atherosclerosis. No enlarged abdominal or pelvic lymph nodes. Reproductive: Prostate is unremarkable. Other: No free fluid or free intraperitoneal gas. No abdominal wall hernia. Musculoskeletal: Unremarkable left hip arthroplasty. Stable findings of ankylosing spondylitis. Stable T10-L1 posterior fusion. Chronic fracture extending through the  T11-T12 disc space and posterior elements, unchanged. No acute bony abnormalities. IMPRESSION: 1. No acute intra-abdominal or intrapelvic process. 2. Trace left pleural effusion with minimal left lower lobe atelectasis. 3. Sigmoid diverticulosis without diverticulitis. 4. Stable ankylosing spondylitis, with posterior spinal fusion spanning T10-L2. Chronic T11-T12 fracture through the fused disc space and posterior elements, unchanged since prior exam. 5.  Aortic Atherosclerosis (ICD10-I70.0). Electronically Signed   By: Sharlet Salina M.D.   On: 08/08/2023 19:04   DG HIP UNILAT WITH PELVIS 2-3 VIEWS RIGHT Result Date: 08/08/2023 CLINICAL DATA:  Status post fall with worsening hip pain EXAM: DG HIP (WITH OR WITHOUT PELVIS) 3V RIGHT COMPARISON:  None Available. FINDINGS: Partially imaged left hip arthroplasty. Hardware appears intact and well seated. There is no evidence of hip fracture or dislocation. Degenerative changes of  the right hip and partially imaged lumbar spine. Single surgical clip projects over the right lower quadrant. Gas-filled dilation of the presumed sigmoid colon. IMPRESSION: 1. No acute displaced fracture or dislocation. 2. Gas-filled dilation of the presumed sigmoid colon, likely ileus. Electronically Signed   By: Agustin Cree M.D.   On: 08/08/2023 14:36   Scheduled Meds:  apixaban  5 mg Oral BID   Chlorhexidine Gluconate Cloth  6 each Topical Daily   cyanocobalamin  1,000 mcg Oral Daily   fludrocortisone  0.1 mg Oral Daily   furosemide  20 mg Oral Daily   levothyroxine  100 mcg Oral Q0600   midodrine  10 mg Oral BID WC   pantoprazole  40 mg Oral Daily   polyethylene glycol  17 g Oral BID   rosuvastatin  5 mg Oral QHS   senna-docusate  2 tablet Oral QHS   Continuous Infusions:   LOS: 0 days   Shon Hale M.D on 08/09/2023 at 7:15 PM  Go to [email or web address] - for contact info  [facility] - Office  [phone]  If 7PM-7AM, please contact night-coverage [email or web address] 08/09/2023, 7:15 PM

## 2023-08-10 DIAGNOSIS — I350 Nonrheumatic aortic (valve) stenosis: Secondary | ICD-10-CM | POA: Diagnosis present

## 2023-08-10 DIAGNOSIS — Z515 Encounter for palliative care: Secondary | ICD-10-CM | POA: Diagnosis not present

## 2023-08-10 DIAGNOSIS — I482 Chronic atrial fibrillation, unspecified: Secondary | ICD-10-CM | POA: Diagnosis present

## 2023-08-10 DIAGNOSIS — R296 Repeated falls: Secondary | ICD-10-CM | POA: Diagnosis present

## 2023-08-10 DIAGNOSIS — E039 Hypothyroidism, unspecified: Secondary | ICD-10-CM | POA: Diagnosis present

## 2023-08-10 DIAGNOSIS — Y92009 Unspecified place in unspecified non-institutional (private) residence as the place of occurrence of the external cause: Secondary | ICD-10-CM | POA: Diagnosis not present

## 2023-08-10 DIAGNOSIS — E876 Hypokalemia: Secondary | ICD-10-CM | POA: Diagnosis present

## 2023-08-10 DIAGNOSIS — K5900 Constipation, unspecified: Secondary | ICD-10-CM | POA: Diagnosis present

## 2023-08-10 DIAGNOSIS — Z7952 Long term (current) use of systemic steroids: Secondary | ICD-10-CM | POA: Diagnosis not present

## 2023-08-10 DIAGNOSIS — Z9981 Dependence on supplemental oxygen: Secondary | ICD-10-CM | POA: Diagnosis not present

## 2023-08-10 DIAGNOSIS — Z7189 Other specified counseling: Secondary | ICD-10-CM | POA: Diagnosis not present

## 2023-08-10 DIAGNOSIS — S50311A Abrasion of right elbow, initial encounter: Secondary | ICD-10-CM | POA: Diagnosis present

## 2023-08-10 DIAGNOSIS — Z8249 Family history of ischemic heart disease and other diseases of the circulatory system: Secondary | ICD-10-CM | POA: Diagnosis not present

## 2023-08-10 DIAGNOSIS — E782 Mixed hyperlipidemia: Secondary | ICD-10-CM | POA: Diagnosis present

## 2023-08-10 DIAGNOSIS — E669 Obesity, unspecified: Secondary | ICD-10-CM | POA: Diagnosis present

## 2023-08-10 DIAGNOSIS — I951 Orthostatic hypotension: Secondary | ICD-10-CM | POA: Diagnosis present

## 2023-08-10 DIAGNOSIS — K219 Gastro-esophageal reflux disease without esophagitis: Secondary | ICD-10-CM | POA: Diagnosis present

## 2023-08-10 DIAGNOSIS — I5032 Chronic diastolic (congestive) heart failure: Secondary | ICD-10-CM | POA: Diagnosis present

## 2023-08-10 DIAGNOSIS — E2740 Unspecified adrenocortical insufficiency: Secondary | ICD-10-CM | POA: Diagnosis present

## 2023-08-10 DIAGNOSIS — I110 Hypertensive heart disease with heart failure: Secondary | ICD-10-CM | POA: Diagnosis present

## 2023-08-10 DIAGNOSIS — Z7901 Long term (current) use of anticoagulants: Secondary | ICD-10-CM | POA: Diagnosis not present

## 2023-08-10 DIAGNOSIS — Z6836 Body mass index (BMI) 36.0-36.9, adult: Secondary | ICD-10-CM | POA: Diagnosis not present

## 2023-08-10 DIAGNOSIS — Z79890 Hormone replacement therapy (postmenopausal): Secondary | ICD-10-CM | POA: Diagnosis not present

## 2023-08-10 DIAGNOSIS — W19XXXA Unspecified fall, initial encounter: Secondary | ICD-10-CM | POA: Diagnosis present

## 2023-08-10 DIAGNOSIS — J9611 Chronic respiratory failure with hypoxia: Secondary | ICD-10-CM | POA: Diagnosis present

## 2023-08-10 DIAGNOSIS — Z8349 Family history of other endocrine, nutritional and metabolic diseases: Secondary | ICD-10-CM | POA: Diagnosis not present

## 2023-08-10 DIAGNOSIS — M25551 Pain in right hip: Secondary | ICD-10-CM | POA: Diagnosis present

## 2023-08-10 LAB — RENAL FUNCTION PANEL
Albumin: 2.5 g/dL — ABNORMAL LOW (ref 3.5–5.0)
Anion gap: 9 (ref 5–15)
BUN: 11 mg/dL (ref 8–23)
CO2: 33 mmol/L — ABNORMAL HIGH (ref 22–32)
Calcium: 7.8 mg/dL — ABNORMAL LOW (ref 8.9–10.3)
Chloride: 97 mmol/L — ABNORMAL LOW (ref 98–111)
Creatinine, Ser: 0.59 mg/dL — ABNORMAL LOW (ref 0.61–1.24)
GFR, Estimated: >60 mL/min (ref >60)
Glucose, Bld: 75 mg/dL (ref 70–99)
Phosphorus: 3 mg/dL (ref 2.5–4.6)
Potassium: 2.6 mmol/L — CL (ref 3.5–5.1)
Sodium: 139 mmol/L (ref 135–145)

## 2023-08-10 MED ORDER — POTASSIUM CHLORIDE CRYS ER 20 MEQ PO TBCR
40.0000 meq | EXTENDED_RELEASE_TABLET | ORAL | Status: AC
  Administered 2023-08-10 (×3): 40 meq via ORAL
  Filled 2023-08-10 (×3): qty 2

## 2023-08-10 MED ORDER — POTASSIUM CHLORIDE 10 MEQ/100ML IV SOLN
10.0000 meq | INTRAVENOUS | Status: AC
  Administered 2023-08-10 (×4): 10 meq via INTRAVENOUS
  Filled 2023-08-10 (×4): qty 100

## 2023-08-10 MED ORDER — METHOCARBAMOL 500 MG PO TABS
500.0000 mg | ORAL_TABLET | Freq: Four times a day (QID) | ORAL | Status: DC | PRN
  Administered 2023-08-10 – 2023-08-12 (×4): 500 mg via ORAL
  Filled 2023-08-10 (×4): qty 1

## 2023-08-10 NOTE — Progress Notes (Signed)
 Messaged low potassium level to Dr. Mariea Clonts.

## 2023-08-10 NOTE — Progress Notes (Signed)
   08/10/23 1113  Vitals  BP 116/72  MAP (mmHg) 83  BP Location Left Arm  BP Method Automatic  Patient Position (if appropriate) Lying  Pulse Rate 63  Pulse Rate Source Monitor  MEWS COLOR  MEWS Score Color Green  Oxygen Therapy  SpO2 98 %  O2 Device Room Air  MEWS Score  MEWS Temp 0  MEWS Systolic 0  MEWS Pulse 0  MEWS RR 0  MEWS LOC 0  MEWS Score 0   Received from ICU into room 30

## 2023-08-10 NOTE — Progress Notes (Signed)
 Mobility Specialist Progress Note:    08/10/23 1130  Mobility  Activity Stood at bedside;Transferred to/from [facility];Ambulated with assistance in room;Dangled on edge of bed  Level of Assistance Minimal assist, patient does 75% or more  Assistive Device Front wheel walker  Distance Ambulated (ft) 3 ft  Range of Motion/Exercises Active;All extremities  Activity Response Tolerated well  Mobility Referral Yes  Mobility visit 1 Mobility  Mobility Specialist Start Time (ACUTE ONLY) 1130  Mobility Specialist Stop Time (ACUTE ONLY) 1215  Mobility Specialist Time Calculation (min) (ACUTE ONLY) 45 min   Pt received in bed, agreeable to mobility. Required MinA to stand and ambulate with RW. Tolerated well, c/o back pain. Returned pt supine, alarm on. All needs met.  Miki Labuda Mobility Specialist Please contact via Special educational needs teacher or  Rehab office at [phone]

## 2023-08-10 NOTE — Progress Notes (Signed)
 PROGRESS NOTE     Roy [name], is a 73 y.o. male, DOB - June 09, 1950, [id number]  Admit date - 08/08/2023   Admitting Physician Jozelynn Danielson Mariea Clonts, MD  Outpatient Primary MD for the patient is Hemberg, Ruby Cola, NP  LOS - 0  Chief Complaint  Patient presents with   Hip Pain        Brief Narrative:  72 y.o. male with medical history significant of atrial fibrillation maintained on Eliquis, adrenal insufficiency, diffuse large B-cell lymphoma, GERD, hyperlipidemia, hypertension admitted after falling and found to have A-fib in the setting of severe electrolyte abnormalities including severe hypokalemia and hypophosphatemia    -Assessment and Plan: 1) status post fall with right hip pain--- syncope unlikely -Physical therapy eval appreciated recommends SNF rehab 08/10/23- --patient is agreeable to go to rehab -Prior to admission patient lived alone Recurrent falls---PTA pt lived alone and did very poorly, patient has significant limitations with mobility related ADLs- this patient needs to continue to be monitored in the hospital until a SNF bed is obtained as she is not safe to go home with her current physcical limitations   2) persistent severe hypokalemia and hypophosphatemia--- despite replacement potassium remains low -Suspect this is due to Florinef and furosemide use PTA 08/10/23 -Phosphorus normalized after replacement -Potassium remains quite low at 2.6 despite aggressive replacement --- Continue to replace and recheck electrolytes  3)History of adrenal insufficiency/orthostatic hypotension -Orthostatic hypotension may have contributed to the fall -Consider discontinuation of Florinef due to persistent electrolyte abnormalities -Consider increasing midodrine for orthostatic hypotension  4) chronic diastolic dysfunction CHF--stable, -Echo from 07/04/2023 with EF of 55 to 60% -Continue Lasix  5) chronic atrial fibrillation--continue Eliquis.  Prophylaxis -Currently  not on rate control medications  6)Hypothyroidism--continue levothyroxine  7)GERD--continue Protonix  8)HLD--stable, continue Cresto  Status is: Inpatient   Disposition: The patient is from: Home              Anticipated d/c is to: SNF              Anticipated d/c date is: 1 day              Patient currently is not medically stable to d/c. Barriers: Not Clinically Stable-   Code Status :  -  Code Status: Full Code   Family Communication:    NA (patient is alert, awake and coherent)   DVT Prophylaxis  :   - SCDs   SCDs Start: 08/09/23 0209 apixaban (ELIQUIS) tablet 5 mg   Lab Results  Component Value Date   PLT 190 08/09/2023    Inpatient Medications  Scheduled Meds:  apixaban  5 mg Oral BID   Chlorhexidine Gluconate Cloth  6 each Topical Daily   cyanocobalamin  1,000 mcg Oral Daily   fludrocortisone  0.1 mg Oral Daily   furosemide  20 mg Oral Daily   levothyroxine  100 mcg Oral Q0600   midodrine  10 mg Oral BID WC   pantoprazole  40 mg Oral Daily   polyethylene glycol  17 g Oral BID   rosuvastatin  5 mg Oral QHS   senna-docusate  2 tablet Oral QHS   Continuous Infusions: PRN Meds:.acetaminophen **OR** acetaminophen, methocarbamol, ondansetron **OR** ondansetron (ZOFRAN) IV, oxyCODONE   Anti-infectives (From admission, onward)    None      Subjective: [name] today has no fevers, no emesis,  No chest pain,   - -Patient continues to have generalized weakness -Patient is now agreeable to go to rehab -  Voiding okay -Patient was agreeable to work with mobility specialist today   Objective: Vitals:   08/10/23 0800 08/10/23 0810 08/10/23 1000 08/10/23 1113  BP: 118/67  120/72 116/72  Pulse: 65  69 63  Resp: 18  14   Temp:  97.9 F (36.6 C)    TempSrc:  Oral    SpO2: 98%  97% 98%  Weight:      Height:        Intake/Output Summary (Last 24 hours) at 08/10/2023 1720 Last data filed at 08/10/2023 1300 Gross per 24 hour  Intake 797.27 ml  Output  425 ml  Net 372.27 ml   Filed Weights   08/08/23 1304 08/09/23 0500 08/10/23 0650  Weight: 103 kg 117.3 kg 117.4 kg    Physical Exam Gen:- Awake Alert, in no acute distress HEENT:- [place].AT, No sclera icterus Neck-Supple Neck,No JVD,.  Lungs-  CTAB , fair symmetrical air movement CV- S1, S2 normal, irregularly irregular Abd-  +ve B.Sounds, Abd Soft, No tenderness,    Extremity/Skin:- No significant edema, pedal pulses present  Psych-affect is appropriate, oriented x3 Neuro-generalized weakness, no new focal deficits, no tremors  Data Reviewed: I have personally reviewed following labs and imaging studies  CBC: Recent Labs  Lab 08/08/23 1505 08/09/23 0412  WBC 11.2* 9.2  NEUTROABS 9.7*  --   HGB 13.2 11.7*  HCT 42.6 36.7*  MCV 95.7 95.1  PLT 214 190   Basic Metabolic Panel: Recent Labs  Lab 08/08/23 1505 08/09/23 0412 08/10/23 0625  NA 139 136 139  K 2.2* 2.7* 2.6*  CL 80* 84* 97*  CO2 43* 44* 33*  GLUCOSE 137* 122* 75  BUN 19 17 11   CREATININE 1.04 0.88 5.46*  CALCIUM 9.1 8.9 7.8*  MG 1.8 2.0  --   PHOS  --  2.3* 3.0   GFR: Estimated Creatinine Clearance: 108.8 mL/min (A) (by C-G formula based on SCr of 0.59 mg/dL (L)). Liver Function Tests: Recent Labs  Lab 08/08/23 1505 08/09/23 0412 08/10/23 0625  AST 29 26  --   ALT 19 16  --   ALKPHOS 114 98  --   BILITOT 1.7* 1.3*  --   PROT 6.7 5.8*  --   ALBUMIN 3.5 3.0* 2.5*   Recent Results (from the past 240 hours)  MRSA Next Gen by PCR, Nasal     Status: None   Collection Time: 08/09/23 12:02 AM   Specimen: Nasal Mucosa; Nasal Swab  Result Value Ref Range Status   MRSA by PCR Next Gen NOT DETECTED NOT DETECTED Final    Comment: (NOTE) The GeneXpert MRSA Assay (FDA approved for NASAL specimens only), is one component of a comprehensive MRSA colonization surveillance program. It is not intended to diagnose MRSA infection nor to guide or monitor treatment for MRSA infections. Test performance is not  FDA approved in patients less than 74 years old. Performed at [facility], [address] [place], [place] [address]     Radiology Studies: CT ABDOMEN PELVIS W CONTRAST Result Date: 08/08/2023 CLINICAL DATA:  Bilateral abdominal pain for 6 months, fell, hip pain EXAM: CT ABDOMEN AND PELVIS WITH CONTRAST TECHNIQUE: Multidetector CT imaging of the abdomen and pelvis was performed using the standard protocol following bolus administration of intravenous contrast. RADIATION DOSE REDUCTION: This exam was performed according to the departmental dose-optimization program which includes automated exposure control, adjustment of the mA and/or kV according to patient size and/or use of iterative reconstruction technique. CONTRAST:  OMNIPAQUE IOHEXOL  300 MG/ML  SOLN COMPARISON:  08/08/2023, 07/12/2023 FINDINGS: Lower chest: Trace left pleural effusion with minimal left lower lobe atelectasis. No acute pleural or parenchymal lung disease. Hepatobiliary: No focal liver abnormality is seen. Status post cholecystectomy. No biliary dilatation. Pancreas: Unremarkable. No pancreatic ductal dilatation or surrounding inflammatory changes. Spleen: Normal in size without focal abnormality. Adrenals/Urinary Tract: Adrenal glands are unremarkable. Kidneys are normal, without renal calculi, focal solid lesion, or hydronephrosis. Bladder is unremarkable. Stomach/Bowel: No bowel obstruction or ileus. Sigmoid diverticulosis without diverticulitis. No bowel wall thickening or inflammatory change. Vascular/Lymphatic: Aortic atherosclerosis. No enlarged abdominal or pelvic lymph nodes. Reproductive: Prostate is unremarkable. Other: No free fluid or free intraperitoneal gas. No abdominal wall hernia. Musculoskeletal: Unremarkable left hip arthroplasty. Stable findings of ankylosing spondylitis. Stable T10-L1 posterior fusion. Chronic fracture extending through the T11-T12 disc space and posterior elements, unchanged. No acute bony  abnormalities. IMPRESSION: 1. No acute intra-abdominal or intrapelvic process. 2. Trace left pleural effusion with minimal left lower lobe atelectasis. 3. Sigmoid diverticulosis without diverticulitis. 4. Stable ankylosing spondylitis, with posterior spinal fusion spanning T10-L2. Chronic T11-T12 fracture through the fused disc space and posterior elements, unchanged since prior exam. 5.  Aortic Atherosclerosis (ICD10-I70.0). Electronically Signed   By: Sharlet Salina M.D.   On: 08/08/2023 19:04   Scheduled Meds:  apixaban  5 mg Oral BID   Chlorhexidine Gluconate Cloth  6 each Topical Daily   cyanocobalamin  1,000 mcg Oral Daily   fludrocortisone  0.1 mg Oral Daily   furosemide  20 mg Oral Daily   levothyroxine  100 mcg Oral Q0600   midodrine  10 mg Oral BID WC   pantoprazole  40 mg Oral Daily   polyethylene glycol  17 g Oral BID   rosuvastatin  5 mg Oral QHS   senna-docusate  2 tablet Oral QHS   Continuous Infusions:   LOS: 0 days   Shon Hale M.D on 08/10/2023 at 5:20 PM  Go to [email or web address] - for contact info  [facility] - Office  [phone]  If 7PM-7AM, please contact night-coverage [email or web address] 08/10/2023, 5:20 PM

## 2023-08-10 NOTE — Plan of Care (Signed)
  Problem: Education: Goal: Knowledge of General Education information will improve Description: Including pain rating scale, medication(s)/side effects and non-pharmacologic comfort measures Outcome: Progressing   Problem: Coping: Goal: Level of anxiety will decrease Outcome: Progressing   Problem: Elimination: Goal: Will not experience complications related to urinary retention Outcome: Progressing   Problem: Safety: Goal: Ability to remain free from injury will improve Outcome: Progressing

## 2023-08-11 DIAGNOSIS — E876 Hypokalemia: Secondary | ICD-10-CM | POA: Diagnosis not present

## 2023-08-11 LAB — RENAL FUNCTION PANEL
Albumin: 2.9 g/dL — ABNORMAL LOW (ref 3.5–5.0)
Anion gap: 10 (ref 5–15)
BUN: 9 mg/dL (ref 8–23)
CO2: 34 mmol/L — ABNORMAL HIGH (ref 22–32)
Calcium: 9.3 mg/dL (ref 8.9–10.3)
Chloride: 92 mmol/L — ABNORMAL LOW (ref 98–111)
Creatinine, Ser: 0.71 mg/dL (ref 0.61–1.24)
GFR, Estimated: >60 mL/min (ref >60)
Glucose, Bld: 90 mg/dL (ref 70–99)
Phosphorus: 3.2 mg/dL (ref 2.5–4.6)
Potassium: 3.4 mmol/L — ABNORMAL LOW (ref 3.5–5.1)
Sodium: 136 mmol/L (ref 135–145)

## 2023-08-11 MED ORDER — POTASSIUM CHLORIDE CRYS ER 20 MEQ PO TBCR
40.0000 meq | EXTENDED_RELEASE_TABLET | ORAL | Status: AC
  Administered 2023-08-11 (×2): 40 meq via ORAL
  Filled 2023-08-11 (×2): qty 2

## 2023-08-11 MED ORDER — LACTULOSE 10 GM/15ML PO SOLN
30.0000 g | Freq: Once | ORAL | Status: AC
  Administered 2023-08-11: 30 g via ORAL
  Filled 2023-08-11: qty 60

## 2023-08-11 NOTE — Plan of Care (Signed)
  Problem: Clinical Measurements: Goal: Respiratory complications will improve Outcome: Progressing   Problem: Nutrition: Goal: Adequate nutrition will be maintained Outcome: Progressing   Problem: Coping: Goal: Level of anxiety will decrease Outcome: Progressing   Problem: Pain Managment: Goal: General experience of comfort will improve and/or be controlled Outcome: Progressing

## 2023-08-11 NOTE — Progress Notes (Signed)
 PROGRESS NOTE     Roy [name], is a 73 y.o. male, DOB - 12-May-1950, ZOX:[id number]  Admit date - 08/08/2023   Admitting Physician Carlyle Mcelrath Mariea Clonts, MD  Outpatient Primary MD for the patient is [name], Roy Cola, NP  LOS - 1  Chief Complaint  Patient presents with   Hip Pain        Brief Narrative:  73 y.o. male with medical history significant of atrial fibrillation maintained on Eliquis, adrenal insufficiency, diffuse large B-cell lymphoma, GERD, hyperlipidemia, hypertension admitted after falling and found to have A-fib in the setting of severe electrolyte abnormalities including severe hypokalemia and hypophosphatemia    -Assessment and Plan: 1) status post fall with right hip pain--- syncope unlikely -Physical therapy eval appreciated recommends SNF rehab 08/11/23- --patient is agreeable to go to rehab -Prior to admission patient lived alone Recurrent falls---PTA pt lived alone and did very poorly, patient has significant limitations with mobility related ADLs- this patient needs to continue to be monitored in the hospital until a SNF bed is obtained as she is not safe to go home with her current physcical limitations -Awaiting SNF bed and insurance authorization for SNF rehab  2) persistent severe hypokalemia and hypophosphatemia--- despite replacement potassium remains low -Suspect this is due to Florinef and furosemide use PTA 08/11/23 -Phosphorus normalized after replacement -Potassium has improved okay to give additional potassium replacements  3)History of adrenal insufficiency/orthostatic hypotension -Orthostatic hypotension may have contributed to the fall -Consider discontinuation of Florinef due to persistent electrolyte abnormalities -Consider increasing midodrine for orthostatic hypotension  4) chronic diastolic dysfunction CHF--stable, -Echo from 07/04/2023 with EF of 55 to 60% -Continue Lasix  5) chronic atrial fibrillation--continue Eliquis.   Prophylaxis -Currently not on rate control medications  6)Hypothyroidism--continue levothyroxine  7)GERD--continue Protonix  8)HLD--stable, continue Crestor  Status is: Inpatient   Disposition: The patient is from: Home              Anticipated d/c is to: SNF              Anticipated d/c date is: 1 day              Patient currently is medically stable to d/c. Barriers: Awaiting SNF bed and insurance authorization for SNF rehab  Code Status :  -  Code Status: Full Code   Family Communication:    NA (patient is alert, awake and coherent)   DVT Prophylaxis  :   - SCDs   SCDs Start: 08/09/23 0209 apixaban (ELIQUIS) tablet 5 mg   Lab Results  Component Value Date   PLT 190 08/09/2023    Inpatient Medications  Scheduled Meds:  apixaban  5 mg Oral BID   Chlorhexidine Gluconate Cloth  6 each Topical Daily   cyanocobalamin  1,000 mcg Oral Daily   fludrocortisone  0.1 mg Oral Daily   furosemide  20 mg Oral Daily   levothyroxine  100 mcg Oral Q0600   midodrine  10 mg Oral BID WC   pantoprazole  40 mg Oral Daily   polyethylene glycol  17 g Oral BID   rosuvastatin  5 mg Oral QHS   senna-docusate  2 tablet Oral QHS   Continuous Infusions: PRN Meds:.acetaminophen **OR** acetaminophen, methocarbamol, ondansetron **OR** ondansetron (ZOFRAN) IV, oxyCODONE   Anti-infectives (From admission, onward)    None      Subjective: [name] today has no fevers, no emesis,  No chest pain,   - -Generalized weakness and fatigue persist -Voiding well  Objective: Vitals:   08/10/23 1937 08/11/23 0500 08/11/23 0513 08/11/23 1329  BP: 116/76  114/67 130/73  Pulse: 61  61 62  Resp: 18  17 18   Temp: 98.9 F (37.2 C)  98.3 F (36.8 C) 98.3 F (36.8 C)  TempSrc: Oral  Oral Oral  SpO2: 97%  99% 94%  Weight:  118.6 kg    Height:        Intake/Output Summary (Last 24 hours) at 08/11/2023 1753 Last data filed at 08/11/2023 0546 Gross per 24 hour  Intake 240 ml  Output 550 ml   Net -310 ml   Filed Weights   08/09/23 0500 08/10/23 0650 08/11/23 0500  Weight: 117.3 kg 117.4 kg 118.6 kg    Physical Exam Gen:- Awake Alert, in no acute distress HEENT:- [place].AT, No sclera icterus Neck-Supple Neck,No JVD,.  Lungs-  CTAB , fair symmetrical air movement CV- S1, S2 normal, irregularly irregular Abd-  +ve B.Sounds, Abd Soft, No tenderness,    Extremity/Skin:- No significant edema, pedal pulses present  Psych-affect is appropriate, oriented x3 Neuro-generalized weakness, no new focal deficits, no tremors  Data Reviewed: I have personally reviewed following labs and imaging studies  CBC: Recent Labs  Lab 08/08/23 1505 08/09/23 0412  WBC 11.2* 9.2  NEUTROABS 9.7*  --   HGB 13.2 11.7*  HCT 42.6 36.7*  MCV 95.7 95.1  PLT 214 190   Basic Metabolic Panel: Recent Labs  Lab 08/08/23 1505 08/09/23 0412 08/10/23 0625 08/11/23 0433  NA 139 136 139 136  K 2.2* 2.7* 2.6* 3.4*  CL 80* 84* 97* 92*  CO2 43* 44* 33* 34*  GLUCOSE 137* 122* 75 90  BUN 19 17 11 9   CREATININE 1.04 0.88 0.59* 0.71  CALCIUM 9.1 8.9 7.8* 9.3  MG 1.8 2.0  --   --   PHOS  --  2.3* 3.0 3.2   GFR: Estimated Creatinine Clearance: 109.3 mL/min (by C-G formula based on SCr of 0.71 mg/dL). Liver Function Tests: Recent Labs  Lab 08/08/23 1505 08/09/23 0412 08/10/23 0625 08/11/23 0433  AST 29 26  --   --   ALT 19 16  --   --   ALKPHOS 114 98  --   --   BILITOT 1.7* 1.3*  --   --   PROT 6.7 5.8*  --   --   ALBUMIN 3.5 3.0* 2.5* 2.9*   Recent Results (from the past 240 hours)  MRSA Next Gen by PCR, Nasal     Status: None   Collection Time: 08/09/23 12:02 AM   Specimen: Nasal Mucosa; Nasal Swab  Result Value Ref Range Status   MRSA by PCR Next Gen NOT DETECTED NOT DETECTED Final    Comment: (NOTE) The GeneXpert MRSA Assay (FDA approved for NASAL specimens only), is one component of a comprehensive MRSA colonization surveillance program. It is not intended to diagnose MRSA  infection nor to guide or monitor treatment for MRSA infections. Test performance is not FDA approved in patients less than 57 years old. Performed at [facility], [address] [place], [place] [address]     Radiology Studies: No results found.  Scheduled Meds:  apixaban  5 mg Oral BID   Chlorhexidine Gluconate Cloth  6 each Topical Daily   cyanocobalamin  1,000 mcg Oral Daily   fludrocortisone  0.1 mg Oral Daily   furosemide  20 mg Oral Daily   levothyroxine  100 mcg Oral Q0600   midodrine  10 mg Oral BID  WC   pantoprazole  40 mg Oral Daily   polyethylene glycol  17 g Oral BID   rosuvastatin  5 mg Oral QHS   senna-docusate  2 tablet Oral QHS   Continuous Infusions:   LOS: 1 day   Shon Hale M.D on 08/11/2023 at 5:53 PM  Go to [email or web address] - for contact info  [facility] - Office  [phone]  If 7PM-7AM, please contact night-coverage [email or web address] 08/11/2023, 5:53 PM

## 2023-08-12 ENCOUNTER — Encounter (HOSPITAL_COMMUNITY): Payer: Self-pay | Admitting: Family Medicine

## 2023-08-12 DIAGNOSIS — Z7189 Other specified counseling: Secondary | ICD-10-CM | POA: Diagnosis not present

## 2023-08-12 DIAGNOSIS — Z515 Encounter for palliative care: Secondary | ICD-10-CM

## 2023-08-12 DIAGNOSIS — E876 Hypokalemia: Secondary | ICD-10-CM | POA: Diagnosis not present

## 2023-08-12 LAB — BASIC METABOLIC PANEL WITH GFR
Anion gap: 11 (ref 5–15)
BUN: 6 mg/dL — ABNORMAL LOW (ref 8–23)
CO2: 30 mmol/L (ref 22–32)
Calcium: 9.1 mg/dL (ref 8.9–10.3)
Chloride: 92 mmol/L — ABNORMAL LOW (ref 98–111)
Creatinine, Ser: 0.83 mg/dL (ref 0.61–1.24)
GFR, Estimated: >60 mL/min (ref >60)
Glucose, Bld: 86 mg/dL (ref 70–99)
Potassium: 3.3 mmol/L — ABNORMAL LOW (ref 3.5–5.1)
Sodium: 133 mmol/L — ABNORMAL LOW (ref 135–145)

## 2023-08-12 MED ORDER — MIDODRINE HCL 5 MG PO TABS
10.0000 mg | ORAL_TABLET | Freq: Three times a day (TID) | ORAL | Status: DC
  Administered 2023-08-12 – 2023-08-13 (×3): 10 mg via ORAL
  Filled 2023-08-12 (×3): qty 2

## 2023-08-12 MED ORDER — POTASSIUM CHLORIDE CRYS ER 20 MEQ PO TBCR
40.0000 meq | EXTENDED_RELEASE_TABLET | ORAL | Status: AC
  Administered 2023-08-12 (×2): 40 meq via ORAL
  Filled 2023-08-12 (×2): qty 2

## 2023-08-12 NOTE — Progress Notes (Signed)
 Physical Therapy Treatment Patient Details Name: Roy [name] MRN: [id number] DOB: 08-13-1950 Today's Date: 08/12/2023   History of Present Illness Roy [name] is a 73 y.o. male with medical history significant of atrial fibrillation maintained on Eliquis, adrenal insufficiency, diffuse large B-cell lymphoma, GERD, hyperlipidemia, hypertension who presents to the emergency department due to complaints of right hip pain.  Patient endorsed sustaining a fall at home on Saturday (4/12), he states that he stumbled and hit against the wall while using his walker, he sustained right elbow abrasion and has been complaining of right hip pain since then.  Patient was recently admitted from 2/25 to 3/5 due to community-acquired pneumonia and acute on chronic  HFpEF, was discharged to SNF was just recently discharged from SNF to home last week and had been requiring minimal assistance.  However, yesterday, he had difficulty in being able to lift his right leg or put weight on it, so he decided to go to the ED for further evaluation and management..  Patient denies chest pain, shortness of breath, fever or chills.    PT Comments  Patient limited during session on this date due to pain, fatigue, and bowel. Session began with patient requiring mod A for bed mobility, including supine<>sit and rolling onto R side. Patient able to perform STS from raised bed and BSC with RW and CGA for safety. Very short steps and labored movement during transfer. No apparent SOB and/or dyspnea to note as Show Low/supplemental O2 has been removed. Patient continues to have decreased strength, decreased endurance, pain, and impaired balance and/or coordination which he will benefit from continued skilled physical therapy acutely and in recommended venue to address in order to return home with PLOF.     If plan is discharge home, recommend the following: A little help with walking and/or transfers;A lot of help with bathing/dressing/bathroom    Can travel by private vehicle     Yes  Equipment Recommendations  None recommended by PT    Recommendations for Other Services       Precautions / Restrictions Precautions Precautions: Fall Restrictions Weight Bearing Restrictions Per Provider Order: No     Mobility  Bed Mobility Overal bed mobility: Needs Assistance Bed Mobility: Supine to Sit, Sit to Supine     Supine to sit: Mod assist Sit to supine: Mod assist   General bed mobility comments: HOB slightly elevated 2/2 back pain. Pt needing mod A pull to sit during supine>sit and LE handling A for sit>supine    Transfers Overall transfer level: Modified independent Equipment used: Rolling walker (2 wheels) Transfers: Sit to/from Stand, Bed to chair/wheelchair/BSC Sit to Stand: Contact guard assist   Step pivot transfers: Contact guard assist       General transfer comment: CGA for safety. Inc time/multiple attempts and rise in bed height needed for STS. Inc height on BSC as well    Ambulation/Gait               General Gait Details: Deferred on this date 2/2 bowel issues   Stairs             Wheelchair Mobility     Tilt Bed    Modified Rankin (Stroke Patients Only)       Balance Overall balance assessment: Mild deficits observed, not formally tested  Communication Communication Communication: No apparent difficulties  Cognition Arousal: Alert Behavior During Therapy: WFL for tasks assessed/performed   PT - Cognitive impairments: No apparent impairments                         Following commands: Intact      Cueing Cueing Techniques: Verbal cues  Exercises      General Comments        Pertinent Vitals/Pain Pain Assessment Pain Assessment: No/denies pain (Denies pain. Endorses muscle spasms in back)    Home Living                          Prior Function            PT Goals  (current goals can now be found in the care plan section) Acute Rehab PT Goals Patient Stated Goal: Return home with appropriate support PT Goal Formulation: With patient Time For Goal Achievement: 08/23/23 Potential to Achieve Goals: Good Progress towards PT goals: Progressing toward goals    Frequency    Min 3X/week      PT Plan      Co-evaluation              AM-PAC PT "6 Clicks" Mobility   Outcome Measure  Help needed turning from your back to your side while in a flat bed without using bedrails?: A Lot Help needed moving from lying on your back to sitting on the side of a flat bed without using bedrails?: A Lot Help needed moving to and from a bed to a chair (including a wheelchair)?: A Little Help needed standing up from a chair using your arms (e.g., wheelchair or bedside chair)?: A Little Help needed to walk in hospital room?: A Little Help needed climbing 3-5 steps with a railing? : A Lot 6 Click Score: 15    End of Session Equipment Utilized During Treatment: Gait belt Activity Tolerance: Patient tolerated treatment well;Patient limited by pain;Patient limited by fatigue Patient left: in bed;with call bell/phone within reach   PT Visit Diagnosis: Unsteadiness on feet (R26.81);Other abnormalities of gait and mobility (R26.89);History of falling (Z91.81);Difficulty in walking, not elsewhere classified (R26.2);Pain     Time: 1610-9604 PT Time Calculation (min) (ACUTE ONLY): 22 min  Charges:    $Therapeutic Activity: 8-22 mins PT General Charges $$ ACUTE PT VISIT: 1 Visit                        4:21 PM, 08/12/23 Deandre Stansel Powell-Butler, PT, DPT [place] with [facility]

## 2023-08-12 NOTE — Consult Note (Signed)
 Consultation Note Date: 08/12/2023   Patient Name: Roy [name]  DOB: 1950-06-14  MRN: [id number]  Age / Sex: 73 y.o., male  PCP: Rebecka Apley, NP Referring Physician: Shon Hale, MD  Reason for Consultation: Establishing goals of care  HPI/Patient Profile: 73 y.o. male  with past medical history of atrial fibrillation maintained on Eliquis, adrenal insufficiency, diffuse large B-cell lymphoma, GERD, hyperlipidemia, hypertension admitted on 08/08/2023 with status post fall with right hip pain, persistent severe hypokalemia.   Clinical Assessment and Goals of Care: Face-to-face discussion with transitional care team and bedside nursing staff related to patient condition, needs, goals of care, disposition.    I have reviewed medical records including EPIC notes, labs and imaging, received report from RN, assessed the patient.  Roy [name] is lying quietly in bed.  He appears acutely/chronically ill and somewhat frail.  He greets me, making and somewhat keeping eye contact.  He is alert and oriented x 3, able to make his needs known.  There is no family at bedside at this time.  We meet at the bedside to discuss diagnosis prognosis, GOC, EOL wishes, disposition and options. I introduced Palliative Medicine as specialized medical care for people living with serious illness. It focuses on providing relief from the symptoms and stress of a serious illness. The goal is to improve quality of life for both the patient and the family.  We discussed a brief life review of the patient.  Roy [name] states that he is a widower.  He retired from [employer].  He shares that he had been living independently prior to his recent acute illnesses.  He was able to do his own bathing and dressing, but his son helped with getting groceries.  He tells me that he recently went to rehab for 10 to 12 days and feels that he  could benefit from more rehab.  We then focused on their current illness.  We talked about his acute illness and the treatment plan.  He tells me that he would like to return to short-term rehab, [facility].  He states that he felt he may progress while there.  We talked about his cancer treatment.  He tells me that he has not had an oncology follow-up in quite some time.  I encouraged him to call for an appointment.  The natural disease trajectory and expectations at EOL were discussed.  Advanced directives, concepts specific to code status, artifical feeding and hydration, and rehospitalization were considered and discussed.  We talked about the concept of "treat the treatable, but allow a natural passing".  Roy [name] states that he would like attempted resuscitation.  I ask him if he has considered how long he would accept life support.  He tells me he has not considered this.  I encouraged him to do so, and let his HCPOA know of his wishes.  Discussed the importance of continued conversation with family and the medical providers regarding overall plan of care and treatment options, ensuring decisions are within the  context of the patient's values and GOCs.  Questions and concerns were addressed.  The family was encouraged to call with questions or concerns.  PMT will continue to support holistically.  Conference with attending, bedside nursing staff, transitional care team related to patient condition, needs, goals of care, disposition.   HCPOA  NEXT OF KIN -Roy [name] states that his son, Roy [name], would make healthcare choices if he were unable.    SUMMARY OF RECOMMENDATIONS   Full scope/full code Considering length of time on life support Short-term rehab with ultimate goal of returning home To self schedule follow-up with oncology   Code Status/Advance Care Planning: Full code -  We talked about the concept of "treat the treatable, but allow a natural passing".  Roy [name]  states that he would like attempted resuscitation.  I ask him if he has considered how long he would accept life support.  He tells me he has not considered this.  I encouraged him to do so, and let his HCPOA know of his wishes.  Symptom Management:  Per hospitalist, no additional needs at this time.  Palliative Prophylaxis:  Frequent Pain Assessment, Oral Care, and Turn Reposition  Additional Recommendations (Limitations, Scope, Preferences): Full Scope Treatment  Psycho-social/Spiritual:  Desire for further Chaplaincy support:no Additional Recommendations: Caregiving  Support/Resources  Prognosis:  Unable to determine, based on outcomes.  Somewhat guarded at this time.  Discharge Planning:  Short term rehab at [facility] with goal of returning home.        Primary Diagnoses: Present on Admission:  Hypokalemia  Atrial fibrillation (HCC)  Obesity (BMI 30-39.9)  Adrenal insufficiency (HCC)  Acquired hypothyroidism   I have reviewed the medical record, interviewed the patient and family, and examined the patient. The following aspects are pertinent.  Past Medical History:  Diagnosis Date   Atrial fibrillation (HCC)    Cancer (HCC)    Diffuse Large B-Cell lymphoma   GERD (gastroesophageal reflux disease)    High cholesterol    Hypertension    Obesity    Pacemaker    Social History   Socioeconomic History   Marital status: Widowed    Spouse name: Not on file   Number of children: 3   Years of education: Not on file   Highest education level: Not on file  Occupational History   Occupation: Retired  Tobacco Use   Smoking status: Never    Passive exposure: Never   Smokeless tobacco: Never  Vaping Use   Vaping status: Never Used  Substance and Sexual Activity   Alcohol use: No   Drug use: No   Sexual activity: Not Currently  Other Topics Concern   Not on file  Social History Narrative   Not on file   Social Drivers of Health   Financial Resource  Strain: Low Risk  (05/15/2023)   Received from [facility]   Overall Financial Resource Strain (CARDIA)    Difficulty of Paying Living Expenses: Not very hard  Food Insecurity: No Food Insecurity (08/08/2023)   Hunger Vital Sign    Worried About Running Out of Food in the Last Year: Never true    Ran Out of Food in the Last Year: Never true  Transportation Needs: No Transportation Needs (08/08/2023)   PRAPARE - Administrator, Civil Service (Medical): No    Lack of Transportation (Non-Medical): No  Physical Activity: Unknown (04/25/2023)   Received from [facility]   Exercise Vital Sign    Days of  Exercise per Week: 0 days    Minutes of Exercise per Session: Not on file  Recent Concern: Physical Activity - Inactive (04/25/2023)   Received from [facility]   Exercise Vital Sign    Days of Exercise per Week: 0 days    Minutes of Exercise per Session: 60 min  Stress: No Stress Concern Present (04/25/2023)   Received from [facility] - Occupational Stress Questionnaire    Feeling of Stress : Not at all  Social Connections: Moderately Isolated (08/08/2023)   Social Connection and Isolation Panel [NHANES]    Frequency of Communication with Friends and Family: Three times a week    Frequency of Social Gatherings with Friends and Family: Once a week    Attends Religious Services: More than 4 times per year    Active Member of [employer] or Organizations: No    Attends Banker Meetings: Never    Marital Status: Widowed   Family History  Problem Relation Age of Onset   Heart failure Father    Heart attack Father        Deceased    Thyroid disease Sister    Scheduled Meds:  apixaban  5 mg Oral BID   Chlorhexidine Gluconate Cloth  6 each Topical Daily   cyanocobalamin  1,000 mcg Oral Daily   fludrocortisone  0.1 mg Oral Daily   furosemide  20 mg Oral Daily   levothyroxine  100 mcg Oral Q0600   midodrine  10 mg Oral BID  WC   pantoprazole  40 mg Oral Daily   polyethylene glycol  17 g Oral BID   potassium chloride  40 mEq Oral Q3H   rosuvastatin  5 mg Oral QHS   senna-docusate  2 tablet Oral QHS   Continuous Infusions: PRN Meds:.acetaminophen **OR** acetaminophen, methocarbamol, ondansetron **OR** ondansetron (ZOFRAN) IV, oxyCODONE Medications Prior to Admission:  Prior to Admission medications   Medication Sig Start Date End Date Taking? Authorizing Provider  acetaminophen (TYLENOL) 325 MG tablet Take 2 tablets (650 mg total) by mouth every 6 (six) hours as needed for mild pain (pain score 1-3), fever or headache (or Fever >/= 101). 06/29/23  Yes Emokpae, Courage, MD  apixaban (ELIQUIS) 5 MG TABS tablet Take 1 tablet (5 mg total) by mouth 2 (two) times daily. 06/24/23  Yes Sharee Holster, NP  calcium carbonate (OS-CAL - DOSED IN MG OF ELEMENTAL CALCIUM) 1250 (500 Ca) MG tablet Take 1 tablet (1,250 mg total) by mouth 2 (two) times daily with a meal. 06/06/23  Yes Osvaldo Shipper, MD  ferrous sulfate 325 (65 FE) MG tablet Take 1 tablet (325 mg total) by mouth daily with breakfast. 06/24/23  Yes Sharee Holster, NP  fludrocortisone (FLORINEF) 0.1 MG tablet Take 1 tablet (0.1 mg total) by mouth daily. 06/29/23  Yes Emokpae, Courage, MD  furosemide (LASIX) 20 MG tablet Take 20 mg by mouth daily. 07/26/23  Yes [provider]  GNP ZINC OXIDE 20 % ointment Apply 1 Application topically every 8 (eight) hours as needed for irritation or diaper changes. 08/02/23  Yes [provider]  guaiFENesin (MUCINEX) 600 MG 12 hr tablet Take 1 tablet (600 mg total) by mouth 2 (two) times daily. 07/10/23 07/09/24 Yes Shon Hale, MD  levothyroxine (SYNTHROID) 100 MCG tablet Take 1 tablet (100 mcg total) by mouth daily before breakfast. 06/24/23  Yes Sharee Holster, NP  magnesium oxide (MAG-OX) 400 (240 Mg) MG tablet Take 1  tablet (400 mg total) by mouth daily. 06/24/23  Yes Sharee Holster, NP  methocarbamol (ROBAXIN)  500 MG tablet Take 1 tablet (500 mg total) by mouth every 6 (six) hours as needed for muscle spasms. 06/24/23  Yes Sharee Holster, NP  midodrine (PROAMATINE) 10 MG tablet Take 1 tablet (10 mg total) by mouth 2 (two) times daily with a meal. 06/29/23  Yes Emokpae, Courage, MD  pantoprazole (PROTONIX) 40 MG tablet Take 1 tablet (40 mg total) by mouth daily. 06/29/23 06/28/24 Yes Emokpae, Courage, MD  polyethylene glycol (MIRALAX) 17 g packet Take 17 g by mouth daily. 06/29/23  Yes Shon Hale, MD  Potassium Chloride ER 20 MEQ TBCR Take 1 tablet (20 mEq total) by mouth daily. 1 tab daily by mouth--- take while taking Lasix/furosemide Patient taking differently: Take 20 mEq by mouth in the morning and at bedtime. 1 tab daily by mouth--- take while taking Lasix/furosemide 07/10/23  Yes Emokpae, Courage, MD  predniSONE (DELTASONE) 10 MG tablet Take 1 tablet (10 mg total) by mouth daily with breakfast. 06/24/23  Yes Sharee Holster, NP  rosuvastatin (CRESTOR) 5 MG tablet Take 1 tablet (5 mg total) by mouth at bedtime. 06/24/23  Yes Sharee Holster, NP  torsemide (DEMADEX) 20 MG tablet Take 1 tablet (20 mg total) by mouth daily. 07/11/23  Yes Emokpae, Courage, MD  valACYclovir (VALTREX) 1000 MG tablet Take 1 tablet (1,000 mg total) by mouth daily. 06/24/23  Yes Sharee Holster, NP  vitamin B-12 (CYANOCOBALAMIN) 1000 MCG tablet Take 1 tablet (1,000 mcg total) by mouth daily. 02/28/21  Yes Pennington, Rebekah M, PA-C  Vitamin D, Ergocalciferol, (DRISDOL) 1.25 MG (50000 UNIT) CAPS capsule Take 1 capsule (50,000 Units total) by mouth every 7 (seven) days. 06/24/23  Yes Sharee Holster, NP   Allergies  Allergen Reactions   Sulfa Antibiotics Other (See Comments)    Unknown childhood reaction   Adhesive [Tape] Other (See Comments)    Contact dermatitis   Review of Systems  Unable to perform ROS: Other    Physical Exam Vitals and nursing note reviewed.  Constitutional:      General: He is not in acute  distress.    Appearance: He is ill-appearing.  Cardiovascular:     Rate and Rhythm: Normal rate.  Pulmonary:     Effort: Pulmonary effort is normal. No respiratory distress.  Skin:    General: Skin is warm and dry.  Neurological:     Mental Status: He is alert and oriented to person, place, and time.  Psychiatric:        Mood and Affect: Mood normal.        Behavior: Behavior normal.     Vital Signs: BP 128/74 (BP Location: Left Arm)   Pulse (!) 56   Temp 98.6 F (37 C) (Oral)   Resp 18   Ht 6' (1.829 m)   Wt 122.5 kg   SpO2 98%   BMI 36.63 kg/m  Pain Scale: 0-10 POSS *See Group Information*: 1-Acceptable,Awake and alert Pain Score: 0-No pain   SpO2: SpO2: 98 % O2 Device:SpO2: 98 % O2 Flow Rate: .O2 Flow Rate (L/min): 1 L/min  IO: Intake/output summary:  Intake/Output Summary (Last 24 hours) at 08/12/2023 0919 Last data filed at 08/12/2023 0536 Gross per 24 hour  Intake --  Output 500 ml  Net -500 ml    LBM: Last BM Date : 08/09/23 Baseline Weight: Weight: 103 kg Most recent weight: Weight: 122.5 kg  Palliative Assessment/Data:     Time In: 1000  Time Out: 1055 Time Total: 55 minutes  Greater than 50%  of this time was spent counseling and coordinating care related to the above assessment and plan.  Signed by: Katheran Awe, NP   Please contact Palliative Medicine Team phone at [phone] for questions and concerns.  For individual provider: See Loretha Stapler

## 2023-08-12 NOTE — Progress Notes (Signed)
 Mobility Specialist Progress Note:    08/12/23 1430  Mobility  Activity Transferred to/from [facility]  Level of Assistance Contact guard assist, steadying assist  Assistive Device Front wheel walker  Activity Response Tolerated well  Mobility Referral Yes  Mobility visit 1 Mobility  Mobility Specialist Start Time (ACUTE ONLY) 1430  Mobility Specialist Stop Time (ACUTE ONLY) 1445  Mobility Specialist Time Calculation (min) (ACUTE ONLY) 15 min   Assisted Physical Therapy with session. Required CGA to stand and transfer with RW. Tolerated well, see PT note for session details. Returned pt supine, all needs met.  Lawerance Bach Mobility Specialist Please contact via Special educational needs teacher or  Rehab office at [phone]

## 2023-08-12 NOTE — Plan of Care (Signed)
 One very large bowel movement during the shift. Robaxin given for muscle spasm in right hip.  Problem: Education: Goal: Knowledge of General Education information will improve Description: Including pain rating scale, medication(s)/side effects and non-pharmacologic comfort measures Outcome: Not Progressing   Problem: Health Behavior/Discharge Planning: Goal: Ability to manage health-related needs will improve Outcome: Not Progressing   Problem: Clinical Measurements: Goal: Ability to maintain clinical measurements within normal limits will improve Outcome: Not Progressing Goal: Will remain free from infection Outcome: Not Progressing Goal: Diagnostic test results will improve Outcome: Not Progressing Goal: Respiratory complications will improve Outcome: Not Progressing Goal: Cardiovascular complication will be avoided Outcome: Not Progressing   Problem: Activity: Goal: Risk for activity intolerance will decrease Outcome: Not Progressing   Problem: Nutrition: Goal: Adequate nutrition will be maintained Outcome: Not Progressing   Problem: Coping: Goal: Level of anxiety will decrease Outcome: Not Progressing   Problem: Elimination: Goal: Will not experience complications related to bowel motility Outcome: Not Progressing Goal: Will not experience complications related to urinary retention Outcome: Not Progressing   Problem: Pain Managment: Goal: General experience of comfort will improve and/or be controlled Outcome: Not Progressing   Problem: Safety: Goal: Ability to remain free from injury will improve Outcome: Not Progressing   Problem: Skin Integrity: Goal: Risk for impaired skin integrity will decrease Outcome: Not Progressing

## 2023-08-12 NOTE — Care Management Important Message (Signed)
 Important Message  Patient Details  Name: Roy [name] MRN: [id number] Date of Birth: Dec 15, 1950   Important Message Given:  Yes - Medicare IM     Corey Harold 08/12/2023, 12:16 PM

## 2023-08-12 NOTE — TOC Progression Note (Signed)
 Transition of Care [facility]) - Progression Note    Patient Details  Name: Roy [name] MRN: [id number] Date of Birth: 1950-07-17  Transition of Care [facility]) CM/SW Contact  Karn Cassis, [place] Phone Number: 08/12/2023, 9:33 AM  Clinical Narrative: Pt confirms he feels he needs SNF at d/c. Pt requests [facility]. Bed offer made. CMA starting auth.       Expected Discharge Plan: Skilled Nursing Facility Barriers to Discharge: Continued Medical Work up  Expected Discharge Plan and Services In-house Referral: Clinical Social Work Discharge Planning Services: CM Consult Post Acute Care Choice: Skilled Nursing Facility Living arrangements for the past 2 months: Single Family Home Expected Discharge Date: 08/12/23                                     Social Determinants of Health (SDOH) Interventions SDOH Screenings   Food Insecurity: No Food Insecurity (08/08/2023)  Housing: Low Risk  (08/08/2023)  Transportation Needs: No Transportation Needs (08/08/2023)  Utilities: Not At Risk (08/08/2023)  Alcohol Screen: Low Risk  (11/08/2020)  Depression (PHQ2-9): Low Risk  (11/08/2020)  Financial Resource Strain: Low Risk  (05/15/2023)   Received from [facility]  Physical Activity: Unknown (04/25/2023)   Received from [facility]  Recent Concern: Physical Activity - Inactive (04/25/2023)   Received from [facility]  Social Connections: Moderately Isolated (08/08/2023)  Stress: No Stress Concern Present (04/25/2023)   Received from [facility]  Tobacco Use: Low Risk  (08/12/2023)    Readmission Risk Interventions    07/03/2023    8:45 AM 06/28/2023    7:56 AM 07/17/2021   12:49 PM  Readmission Risk Prevention Plan  Transportation Screening Complete Complete Complete  HRI or Home Care Consult Complete Complete Complete  Social Work Consult for Recovery Care Planning/Counseling Complete Complete Complete  Palliative Care Screening Not Applicable Not Applicable Not  Applicable  Medication Review Oceanographer) Complete Complete Complete

## 2023-08-12 NOTE — Progress Notes (Signed)
 PROGRESS NOTE  Roy [name], is a 73 y.o. male, DOB - 1950/09/28, ZOX:[id number]  Admit date - 08/08/2023   Admitting Physician Maxime Beckner Mariea Clonts, MD  Outpatient Primary MD for the patient is Hemberg, Ruby Cola, NP  LOS - 2  Chief Complaint  Patient presents with   Hip Pain      Brief Narrative:  73 y.o. male with medical history significant of atrial fibrillation maintained on Eliquis, adrenal insufficiency, diffuse large B-cell lymphoma, GERD, hyperlipidemia, hypertension admitted after falling and found to have A-fib in the setting of severe electrolyte abnormalities including severe hypokalemia and hypophosphatemia    -Assessment and Plan: 1) status post fall with right hip pain--- syncope unlikely -Physical therapy eval appreciated recommends SNF rehab 08/12/23- --patient is agreeable to go to rehab -Continue midodrine -Prior to admission patient lived alone Recurrent falls---PTA pt lived alone and did very poorly, patient has significant limitations with mobility related ADLs- this patient needs to continue to be monitored in the hospital until a SNF bed is obtained as she is not safe to go home with her current physcical limitations -Awaiting SNF bed and insurance authorization for SNF rehab  2) persistent severe hypokalemia and hypophosphatemia--- despite replacement potassium remains low -Suspect this is due to Florinef and furosemide use PTA 08/12/23 -Phosphorus normalized after replacement -Give further potassium replacement  3)History of adrenal insufficiency/orthostatic hypotension -Orthostatic hypotension may have contributed to the fall -Consider discontinuation of Florinef due to persistent electrolyte abnormalities -Increase midodrine to 10 mg TID for orthostatic hypotension  4) chronic diastolic dysfunction CHF--stable, -Echo from 07/04/2023 with EF of 55 to 60% -Continue Lasix  5) chronic atrial fibrillation--continue Eliquis for stroke Prophylaxis -Currently  not on rate control medications  6)Hypothyroidism--continue levothyroxine  7)GERD--continue Protonix  8)HLD--stable, continue Crestor  Status is: Inpatient   Disposition: The patient is from: Home              Anticipated d/c is to: SNF              Anticipated d/c date is: 1 day              Patient currently is medically stable to d/c. Barriers: Awaiting SNF bed and insurance authorization for SNF rehab  Code Status :  -  Code Status: Full Code   Family Communication:    NA (patient is alert, awake and coherent)   DVT Prophylaxis  :   - SCDs   SCDs Start: 08/09/23 0209 apixaban (ELIQUIS) tablet 5 mg   Lab Results  Component Value Date   PLT 190 08/09/2023    Inpatient Medications  Scheduled Meds:  apixaban  5 mg Oral BID   Chlorhexidine Gluconate Cloth  6 each Topical Daily   cyanocobalamin  1,000 mcg Oral Daily   fludrocortisone  0.1 mg Oral Daily   furosemide  20 mg Oral Daily   levothyroxine  100 mcg Oral Q0600   midodrine  10 mg Oral TID WC   pantoprazole  40 mg Oral Daily   polyethylene glycol  17 g Oral BID   rosuvastatin  5 mg Oral QHS   senna-docusate  2 tablet Oral QHS   Continuous Infusions: PRN Meds:.acetaminophen **OR** acetaminophen, methocarbamol, ondansetron **OR** ondansetron (ZOFRAN) IV, oxyCODONE   Anti-infectives (From admission, onward)    None      Subjective: [name] today has no fevers, no emesis,  No chest pain,   - -Generalized weakness and fatigue persist -Voiding well   Objective: Vitals:  08/11/23 1329 08/11/23 2025 08/12/23 0327 08/12/23 1657  BP: 130/73 125/79 128/74 133/71  Pulse: 62 73 (!) 56 66  Resp: 18 19 18 20   Temp: 98.3 F (36.8 C) 98 F (36.7 C) 98.6 F (37 C) 98.4 F (36.9 C)  TempSrc: Oral Oral Oral Oral  SpO2: 94% 96% 98% 95%  Weight:   122.5 kg   Height:        Intake/Output Summary (Last 24 hours) at 08/12/2023 1752 Last data filed at 08/12/2023 1434 Gross per 24 hour  Intake --  Output  1150 ml  Net -1150 ml   Filed Weights   08/10/23 0650 08/11/23 0500 08/12/23 0327  Weight: 117.4 kg 118.6 kg 122.5 kg    Physical Exam Gen:- Awake Alert, in no acute distress HEENT:- [place].AT, No sclera icterus Neck-Supple Neck,No JVD,.  Lungs-  CTAB , fair symmetrical air movement CV- S1, S2 normal, irregularly irregular Abd-  +ve B.Sounds, Abd Soft, No tenderness,    Extremity/Skin:- No significant edema, pedal pulses present  Psych-affect is appropriate, oriented x3 Neuro-generalized weakness, no new focal deficits, no tremors  Data Reviewed: I have personally reviewed following labs and imaging studies  CBC: Recent Labs  Lab 08/08/23 1505 08/09/23 0412  WBC 11.2* 9.2  NEUTROABS 9.7*  --   HGB 13.2 11.7*  HCT 42.6 36.7*  MCV 95.7 95.1  PLT 214 190   Basic Metabolic Panel: Recent Labs  Lab 08/08/23 1505 08/09/23 0412 08/10/23 0625 08/11/23 0433 08/12/23 0414  NA 139 136 139 136 133*  K 2.2* 2.7* 2.6* 3.4* 3.3*  CL 80* 84* 97* 92* 92*  CO2 43* 44* 33* 34* 30  GLUCOSE 137* 122* 75 90 86  BUN 19 17 11 9  6*  CREATININE 1.04 0.88 0.59* 0.71 0.83  CALCIUM 9.1 8.9 7.8* 9.3 9.1  MG 1.8 2.0  --   --   --   PHOS  --  2.3* 3.0 3.2  --    GFR: Estimated Creatinine Clearance: 107.2 mL/min (by C-G formula based on SCr of 0.83 mg/dL). Liver Function Tests: Recent Labs  Lab 08/08/23 1505 08/09/23 0412 08/10/23 0625 08/11/23 0433  AST 29 26  --   --   ALT 19 16  --   --   ALKPHOS 114 98  --   --   BILITOT 1.7* 1.3*  --   --   PROT 6.7 5.8*  --   --   ALBUMIN 3.5 3.0* 2.5* 2.9*   Recent Results (from the past 240 hours)  MRSA Next Gen by PCR, Nasal     Status: None   Collection Time: 08/09/23 12:02 AM   Specimen: Nasal Mucosa; Nasal Swab  Result Value Ref Range Status   MRSA by PCR Next Gen NOT DETECTED NOT DETECTED Final    Comment: (NOTE) The GeneXpert MRSA Assay (FDA approved for NASAL specimens only), is one component of a comprehensive MRSA colonization  surveillance program. It is not intended to diagnose MRSA infection nor to guide or monitor treatment for MRSA infections. Test performance is not FDA approved in patients less than 33 years old. Performed at [facility], [address] [place], [place] [address]     Radiology Studies: No results found.  Scheduled Meds:  apixaban  5 mg Oral BID   Chlorhexidine Gluconate Cloth  6 each Topical Daily   cyanocobalamin  1,000 mcg Oral Daily   fludrocortisone  0.1 mg Oral Daily   furosemide  20 mg Oral Daily  levothyroxine  100 mcg Oral Q0600   midodrine  10 mg Oral TID WC   pantoprazole  40 mg Oral Daily   polyethylene glycol  17 g Oral BID   rosuvastatin  5 mg Oral QHS   senna-docusate  2 tablet Oral QHS   Continuous Infusions:   LOS: 2 days   Shon Hale M.D on 08/12/2023 at 5:52 PM  Go to [email or web address] - for contact info  [facility] - Office  [phone]  If 7PM-7AM, please contact night-coverage [email or web address] 08/12/2023, 5:52 PM

## 2023-08-13 DIAGNOSIS — Z7189 Other specified counseling: Secondary | ICD-10-CM | POA: Diagnosis not present

## 2023-08-13 DIAGNOSIS — E876 Hypokalemia: Secondary | ICD-10-CM | POA: Diagnosis not present

## 2023-08-13 DIAGNOSIS — Z515 Encounter for palliative care: Secondary | ICD-10-CM | POA: Diagnosis not present

## 2023-08-13 LAB — RENAL FUNCTION PANEL
Albumin: 2.9 g/dL — ABNORMAL LOW (ref 3.5–5.0)
Anion gap: 10 (ref 5–15)
BUN: 7 mg/dL — ABNORMAL LOW (ref 8–23)
CO2: 28 mmol/L (ref 22–32)
Calcium: 9.1 mg/dL (ref 8.9–10.3)
Chloride: 97 mmol/L — ABNORMAL LOW (ref 98–111)
Creatinine, Ser: 0.88 mg/dL (ref 0.61–1.24)
GFR, Estimated: >60 mL/min (ref >60)
Glucose, Bld: 86 mg/dL (ref 70–99)
Phosphorus: 3.2 mg/dL (ref 2.5–4.6)
Potassium: 3.6 mmol/L (ref 3.5–5.1)
Sodium: 135 mmol/L (ref 135–145)

## 2023-08-13 MED ORDER — POTASSIUM CHLORIDE CRYS ER 20 MEQ PO TBCR
40.0000 meq | EXTENDED_RELEASE_TABLET | Freq: Once | ORAL | Status: AC
  Administered 2023-08-13: 40 meq via ORAL
  Filled 2023-08-13: qty 2

## 2023-08-13 MED ORDER — MIDODRINE HCL 10 MG PO TABS: 10.0000 mg | ORAL_TABLET | Freq: Three times a day (TID) | ORAL | 3 refills | Status: AC

## 2023-08-13 MED ORDER — SENNOSIDES-DOCUSATE SODIUM 8.6-50 MG PO TABS: 2.0000 | ORAL_TABLET | Freq: Every day | ORAL | 1 refills | Status: AC

## 2023-08-13 MED ORDER — POTASSIUM CHLORIDE ER 20 MEQ PO TBCR: 20.0000 meq | EXTENDED_RELEASE_TABLET | Freq: Two times a day (BID) | ORAL | 1 refills | Status: AC

## 2023-08-13 MED ORDER — HEPARIN SOD (PORK) LOCK FLUSH 100 UNIT/ML IV SOLN
500.0000 [IU] | Freq: Once | INTRAVENOUS | Status: AC
  Administered 2023-08-13: 500 [IU] via INTRAVENOUS
  Filled 2023-08-13: qty 5

## 2023-08-13 NOTE — Discharge Summary (Signed)
 Roy [name], is a 73 y.o. male  DOB 01/17/1951  MRN [id number].  Admission date:  08/08/2023  Admitting Physician  Shon Hale, MD  Discharge Date:  08/13/2023   Primary MD  Rebecka Apley, NP  Recommendations for primary care physician for things to follow:  1)Avoid ibuprofen/Advil/Aleve/Motrin/Goody Powders/Naproxen/BC powders/Meloxicam/Diclofenac/Indomethacin and other Nonsteroidal anti-inflammatory medications as these will make you more likely to bleed and can cause stomach ulcers, can also cause Kidney problems.   2)Very Low-salt diet advised---Less than 2 gm of Sodium per day advised----ok to use Mrs DASH salt substitute instead of Salt 3)Weigh yourself daily, call if you gain more than 3 pounds in 1 day or more than 5 pounds in 1 week as your diuretic medications may need to be adjusted 4)Limit your Fluid  intake to no more than 50 ounces (1.5 Liters) per day 5)Repeat CBC and BMP Blood Tests around Monday 08/19/23  Admission Diagnosis  Hypokalemia [E87.6] Weakness [R53.1] Right hip pain [M25.551]   Discharge Diagnosis  Hypokalemia [E87.6] Weakness [R53.1] Right hip pain [M25.551]    Principal Problem:   Hypokalemia Active Problems:   Atrial fibrillation (HCC)   Adrenal insufficiency (HCC)   Acquired hypothyroidism   Obesity (BMI 30-39.9)   Right hip pain   Abrasion of right elbow      Past Medical History:  Diagnosis Date   Atrial fibrillation (HCC)    Cancer (HCC)    Diffuse Large B-Cell lymphoma   GERD (gastroesophageal reflux disease)    High cholesterol    Hypertension    Obesity    Pacemaker     Past Surgical History:  Procedure Laterality Date   APPENDECTOMY     BIOPSY  06/28/2023   Procedure: BIOPSY;  Surgeon: Corbin Ade, MD;  Location: [facility];  Service: Endoscopy;;   CATARACT EXTRACTION Bilateral 2022   CHOLECYSTECTOMY     ESOPHAGEAL DILATION      multiple times   ESOPHAGOGASTRODUODENOSCOPY (EGD) WITH PROPOFOL N/A 06/28/2023   Procedure: ESOPHAGOGASTRODUODENOSCOPY (EGD) WITH PROPOFOL;  Surgeon: Corbin Ade, MD;  Location: [facility];  Service: Endoscopy;  Laterality: N/A;   IR IMAGING GUIDED PORT INSERTION  03/31/2021   JOINT REPLACEMENT Left    hip   LUMBAR PERCUTANEOUS PEDICLE SCREW 3 LEVEL  05/27/2023   Procedure: POSTERIOR FIXATION AND FUSION OF THORACIC ELEVEN-THORACIC TWELVE FRACTURE DISLOCATION. FIXATION FROM THORACIC TEN-THORACIC ELEVEN METHACRYLATE AUGMENTATION WITH FLUOROSCOPIC GUIDANCE;  Surgeon: Barnett Abu, MD;  Location: [facility] OR;  Service: Neurosurgery;;   Elease Hashimoto DILATION N/A 06/28/2023   Procedure: Alvy Beal;  Surgeon: Corbin Ade, MD;  [facility]: [facility];  Service: Endoscopy;  Laterality: N/A;   PACEMAKER IMPLANT N/A 09/07/2022   Procedure: PACEMAKER IMPLANT;  Surgeon: Regan Lemming, MD;  Location: [facility];  Service: Cardiovascular;  Laterality: N/A;     HPI  from the history and physical done on the day of admission:   HPI: Roy [name] is a 73 y.o. male with medical history significant of atrial  fibrillation maintained on Eliquis, adrenal insufficiency, diffuse large B-cell lymphoma, GERD, hyperlipidemia, hypertension who presents to the emergency department due to complaints of right hip pain.  Patient endorsed sustaining a fall at home on Saturday (4/12), he states that he stumbled and hit against the wall while using his walker, he sustained right elbow abrasion and has been complaining of right hip pain since then. Patient was recently admitted from 2/25 to 3/5 due to community-acquired pneumonia and acute on chronic HFpEF, was discharged to SNF was just recently discharged from SNF to home last week and had been requiring minimal assistance.  However, yesterday, he had difficulty in being able to lift his right leg or put weight on it, so he decided to go to the ED for further  evaluation and management..  Patient denies chest pain, shortness of breath, fever or chills.   ED Course:  In the emergency department, BP was 91/72, other vital signs are within normal range.  Workup in the ED showed normal CBC except for WBC of 11.2.  BMP showed sodium 139, potassium 2.2, chloride 88, bicarb 43, blood glucose 137, BUN 19, creatinine 1.04, total bilirubin 1.7, anion gap 16.  Magnesium 1.8, lipase 24, urinalysis was normal. CT abdomen pelvis with contrast showed no acute intra-abdominal or intrapelvic process.  Trace left pleural effusion with minimal left lower lobe atelectasis. Right hip x-ray showed no acute displaced fracture or dislocation Potassium was replenished and patient showed improvement in being able to extend right lower extremity. Hospitalist was asked to admit patient for further evaluation and management.   Review of Systems: Review of systems as noted in the HPI. All other systems reviewed and are negative.    Hospital Course:     Brief Narrative:  73 y.o. male with medical history significant of atrial fibrillation maintained on Eliquis, adrenal insufficiency, diffuse large B-cell lymphoma, GERD, hyperlipidemia, hypertension admitted after falling and found to have A-fib in the setting of severe electrolyte abnormalities including severe hypokalemia and hypophosphatemia     -Assessment and Plan: 1)Status post fall with right hip pain--- syncope unlikely -Physical therapy eval appreciated recommends SNF rehab --patient is agreeable to go to rehab -Increase midodrine to 10 mg 3 times daily, continue prednisone -Stop Florinef due to significant electrolyte derangements    2) persistent severe hypokalemia and hypophosphatemia--- despite replacement potassium remains low -Suspect this is due to Florinef and furosemide use PTA -Phosphorus and potassium normalized after replacement -Florinef discontinued as above #1 -Discharge on furosemide and potassium  replacements   3)History of adrenal insufficiency/orthostatic hypotension -Orthostatic hypotension may have contributed to the fall --Discontinue Florinef due to persistent electrolyte abnormalities -Increase midodrine to 10 mg 3 times daily, continue prednisone for orthostatic hypotension   4) chronic diastolic dysfunction CHF--stable, -Echo from 07/04/2023 with EF of 55 to 60% -Continue Lasix   5) chronic atrial fibrillation--continue Eliquis for stroke Prophylaxis -Currently not on rate control medications   6)Hypothyroidism--continue levothyroxine   7)GERD--continue Protonix   8)HLD--stable, continue Crestor  Disposition: The patient is from: Home              Anticipated d/c is to: SNF  Discharge Condition: stable  Follow UP   Contact information for after-discharge care     Destination     [facility] .   Service: Skilled Paramedic information: [address] [place] [address] [phone]  Diet and Activity recommendation:  As advised  Discharge Instructions    Discharge Instructions     Call MD for:  difficulty breathing, headache or visual disturbances   Complete by: As directed    Call MD for:  persistant dizziness or light-headedness   Complete by: As directed    Call MD for:  persistant nausea and vomiting   Complete by: As directed    Call MD for:  severe uncontrolled pain   Complete by: As directed    Call MD for:  temperature >100.4   Complete by: As directed    Diet - low sodium heart healthy   Complete by: As directed    Discharge instructions   Complete by: As directed    1)Avoid ibuprofen/Advil/Aleve/Motrin/Goody Powders/Naproxen/BC powders/Meloxicam/Diclofenac/Indomethacin and other Nonsteroidal anti-inflammatory medications as these will make you more likely to bleed and can cause stomach ulcers, can also cause Kidney problems.   2)Very  Low-salt diet advised---Less than 2 gm of Sodium per day advised----ok to use Mrs DASH salt substitute instead of Salt 3)Weigh yourself daily, call if you gain more than 3 pounds in 1 day or more than 5 pounds in 1 week as your diuretic medications may need to be adjusted 4)Limit your Fluid  intake to no more than 50 ounces (1.5 Liters) per day 5)Repeat CBC and BMP Blood Tests around Monday 08/19/23   Discharge wound care:   Complete by: As directed    Pressure Injury prevention measures   Increase activity slowly   Complete by: As directed          Discharge Medications     Allergies as of 08/13/2023       Reactions   Sulfa Antibiotics Other (See Comments)   Unknown childhood reaction   Adhesive [tape] Other (See Comments)   Contact dermatitis        Medication List     STOP taking these medications    fludrocortisone 0.1 MG tablet Commonly known as: FLORINEF   torsemide 20 MG tablet Commonly known as: DEMADEX       TAKE these medications    acetaminophen 325 MG tablet Commonly known as: TYLENOL Take 2 tablets (650 mg total) by mouth every 6 (six) hours as needed for mild pain (pain score 1-3), fever or headache (or Fever >/= 101).   apixaban 5 MG Tabs tablet Commonly known as: Eliquis Take 1 tablet (5 mg total) by mouth 2 (two) times daily.   calcium carbonate 1250 (500 Ca) MG tablet Commonly known as: OS-CAL - dosed in mg of elemental calcium Take 1 tablet (1,250 mg total) by mouth 2 (two) times daily with a meal.   cyanocobalamin 1000 MCG tablet Commonly known as: VITAMIN B12 Take 1 tablet (1,000 mcg total) by mouth daily.   ferrous sulfate 325 (65 FE) MG tablet Take 1 tablet (325 mg total) by mouth daily with breakfast.   furosemide 20 MG tablet Commonly known as: LASIX Take 20 mg by mouth daily.   GNP Zinc Oxide 20 % ointment Generic drug: zinc oxide Apply 1 Application topically every 8 (eight) hours as needed for irritation or diaper  changes.   guaiFENesin 600 MG 12 hr tablet Commonly known as: Mucinex Take 1 tablet (600 mg total) by mouth 2 (two) times daily.   levothyroxine 100 MCG tablet Commonly known as: SYNTHROID Take 1 tablet (100 mcg total) by mouth daily before breakfast.   magnesium oxide 400 (240 Mg) MG tablet Commonly known as: MAG-OX Take  1 tablet (400 mg total) by mouth daily.   methocarbamol 500 MG tablet Commonly known as: ROBAXIN Take 1 tablet (500 mg total) by mouth every 6 (six) hours as needed for muscle spasms.   midodrine 10 MG tablet Commonly known as: PROAMATINE Take 1 tablet (10 mg total) by mouth 3 (three) times daily. What changed: when to take this   pantoprazole 40 MG tablet Commonly known as: Protonix Take 1 tablet (40 mg total) by mouth daily.   polyethylene glycol 17 g packet Commonly known as: MiraLax Take 17 g by mouth daily.   Potassium Chloride ER 20 MEQ Tbcr Take 1 tablet (20 mEq total) by mouth 2 (two) times daily. take while taking Lasix/furosemide What changed:  when to take this additional instructions   predniSONE 10 MG tablet Commonly known as: DELTASONE Take 1 tablet (10 mg total) by mouth daily with breakfast.   rosuvastatin 5 MG tablet Commonly known as: CRESTOR Take 1 tablet (5 mg total) by mouth at bedtime.   senna-docusate 8.6-50 MG tablet Commonly known as: Senokot-S Take 2 tablets by mouth at bedtime.   valACYclovir 1000 MG tablet Commonly known as: VALTREX Take 1 tablet (1,000 mg total) by mouth daily.   Vitamin D (Ergocalciferol) 1.25 MG (50000 UNIT) Caps capsule Commonly known as: DRISDOL Take 1 capsule (50,000 Units total) by mouth every 7 (seven) days.               Discharge Care Instructions  (From admission, onward)           Start     Ordered   08/13/23 0000  Discharge wound care:       Comments: Pressure Injury prevention measures   08/13/23 1146            Major procedures and Radiology Reports - PLEASE  review detailed and final reports for all details, in brief -   CT ABDOMEN PELVIS W CONTRAST Result Date: 08/08/2023 CLINICAL DATA:  Bilateral abdominal pain for 6 months, fell, hip pain EXAM: CT ABDOMEN AND PELVIS WITH CONTRAST TECHNIQUE: Multidetector CT imaging of the abdomen and pelvis was performed using the standard protocol following bolus administration of intravenous contrast. RADIATION DOSE REDUCTION: This exam was performed according to the departmental dose-optimization program which includes automated exposure control, adjustment of the mA and/or kV according to patient size and/or use of iterative reconstruction technique. CONTRAST:  OMNIPAQUE IOHEXOL 300 MG/ML  SOLN COMPARISON:  08/08/2023, 07/12/2023 FINDINGS: Lower chest: Trace left pleural effusion with minimal left lower lobe atelectasis. No acute pleural or parenchymal lung disease. Hepatobiliary: No focal liver abnormality is seen. Status post cholecystectomy. No biliary dilatation. Pancreas: Unremarkable. No pancreatic ductal dilatation or surrounding inflammatory changes. Spleen: Normal in size without focal abnormality. Adrenals/Urinary Tract: Adrenal glands are unremarkable. Kidneys are normal, without renal calculi, focal solid lesion, or hydronephrosis. Bladder is unremarkable. Stomach/Bowel: No bowel obstruction or ileus. Sigmoid diverticulosis without diverticulitis. No bowel wall thickening or inflammatory change. Vascular/Lymphatic: Aortic atherosclerosis. No enlarged abdominal or pelvic lymph nodes. Reproductive: Prostate is unremarkable. Other: No free fluid or free intraperitoneal gas. No abdominal wall hernia. Musculoskeletal: Unremarkable left hip arthroplasty. Stable findings of ankylosing spondylitis. Stable T10-L1 posterior fusion. Chronic fracture extending through the T11-T12 disc space and posterior elements, unchanged. No acute bony abnormalities. IMPRESSION: 1. No acute intra-abdominal or intrapelvic process. 2.  Trace left pleural effusion with minimal left lower lobe atelectasis. 3. Sigmoid diverticulosis without diverticulitis. 4. Stable ankylosing spondylitis, with posterior spinal fusion spanning  T10-L2. Chronic T11-T12 fracture through the fused disc space and posterior elements, unchanged since prior exam. 5.  Aortic Atherosclerosis (ICD10-I70.0). Electronically Signed   By: Sharlet Salina M.D.   On: 08/08/2023 19:04   DG HIP UNILAT WITH PELVIS 2-3 VIEWS RIGHT Result Date: 08/08/2023 CLINICAL DATA:  Status post fall with worsening hip pain EXAM: DG HIP (WITH OR WITHOUT PELVIS) 3V RIGHT COMPARISON:  None Available. FINDINGS: Partially imaged left hip arthroplasty. Hardware appears intact and well seated. There is no evidence of hip fracture or dislocation. Degenerative changes of the right hip and partially imaged lumbar spine. Single surgical clip projects over the right lower quadrant. Gas-filled dilation of the presumed sigmoid colon. IMPRESSION: 1. No acute displaced fracture or dislocation. 2. Gas-filled dilation of the presumed sigmoid colon, likely ileus. Electronically Signed   By: Agustin Cree M.D.   On: 08/08/2023 14:36    Micro Results   Recent Results (from the past 240 hours)  MRSA Next Gen by PCR, Nasal     Status: None   Collection Time: 08/09/23 12:02 AM   Specimen: Nasal Mucosa; Nasal Swab  Result Value Ref Range Status   MRSA by PCR Next Gen NOT DETECTED NOT DETECTED Final    Comment: (NOTE) The GeneXpert MRSA Assay (FDA approved for NASAL specimens only), is one component of a comprehensive MRSA colonization surveillance program. It is not intended to diagnose MRSA infection nor to guide or monitor treatment for MRSA infections. Test performance is not FDA approved in patients less than 43 years old. Performed at [facility], [address] [place], [place] [address]     Today   Subjective    Yaacov Koziol today has no new complaints No fever  Or chills   No Nausea,  Vomiting or Diarrhea  Eating and drinking well, voiding well - Sitting up in the chair          Patient has been seen and examined prior to discharge   Objective   Blood pressure 115/66, pulse 62, temperature 98.3 F (36.8 C), temperature source Oral, resp. rate 18, height 6' (1.829 m), weight 118.4 kg, SpO2 94%.   Intake/Output Summary (Last 24 hours) at 08/13/2023 1148 Last data filed at 08/13/2023 1109 Gross per 24 hour  Intake 0 ml  Output 1900 ml  Net -1900 ml    Exam Gen:- Awake Alert, no acute distress  HEENT:- [place].AT, No sclera icterus Neck-Supple Neck,No JVD,.  Lungs-  CTAB , good air movement bilaterally CV- S1, S2 normal, irregularly irregular  abd-  +ve B.Sounds, Abd Soft, No tenderness,    Extremity/Skin:- No significant edema,   good pulses Psych-affect is appropriate, oriented x3 Neuro- generalized weakness, No new focal deficits, no tremors    Data Review   CBC w Diff:  Lab Results  Component Value Date   WBC 9.2 08/09/2023   HGB 11.7 (L) 08/09/2023   HCT 36.7 (L) 08/09/2023   PLT 190 08/09/2023   LYMPHOPCT 9 08/08/2023   BANDSPCT 3 09/27/2021   MONOPCT 4 08/08/2023   EOSPCT 1 08/08/2023   BASOPCT 0 08/08/2023    CMP:  Lab Results  Component Value Date   NA 135 08/13/2023   NA 143 10/04/2022   K 3.6 08/13/2023   CL 97 (L) 08/13/2023   CO2 28 08/13/2023   BUN 7 (L) 08/13/2023   BUN 11 10/04/2022   CREATININE 0.88 08/13/2023   CREATININE 0.82 11/11/2020   PROT 5.8 (L) 08/09/2023   PROT 6.1  10/04/2022   ALBUMIN 2.9 (L) 08/13/2023   ALBUMIN 4.1 10/04/2022   BILITOT 1.3 (H) 08/09/2023   BILITOT 0.9 10/04/2022   BILITOT 2.3 (H) 11/11/2020   ALKPHOS 98 08/09/2023   AST 26 08/09/2023   AST 74 (H) 11/11/2020   ALT 16 08/09/2023   ALT 21 11/11/2020  .  Total Discharge time is about 33 minutes  Shon Hale M.D on 08/13/2023 at 11:48 AM  Go to [email or web address] -  for contact info  [facility] - Office  [phone]

## 2023-08-13 NOTE — Progress Notes (Signed)
 Palliative:   Roy [name] is lying quietly in bed.  He appears chronically ill, weak and obese.  He greets me, making but not keeping eye contact.  He is alert and oriented, able to make his needs known.  There is no family at bedside at this time.    We talked about his plan for short-term rehab.  Roy [name] states that he felt he did quite well at rehab at [facility].  I encouraged him to work closely with Child psychotherapist for discharge options and plan.  No further questions at this time.  Conference with attending, bedside nursing staff, transition of care team related to patient condition, needs, goals of care, disposition.  Plan: At this point continue full scope/full code.  Short-term rehab at [facility] with ultimate goal of returning home.  Needs to self schedule oncology follow-up.       35 minutes  Lillia Carmel, NP Palliative medicine team Team phone [phone]

## 2023-08-13 NOTE — Progress Notes (Signed)
 Mobility Specialist Progress Note:    08/13/23 1015  Mobility  Activity Transferred to/from [facility]  Level of Assistance Minimal assist, patient does 75% or more  Assistive Device None;Front wheel walker  Distance Ambulated (ft) 5 ft  Range of Motion/Exercises Active;All extremities  Activity Response Tolerated well  Mobility Referral Yes  Mobility visit 1 Mobility  Mobility Specialist Start Time (ACUTE ONLY) 1015  Mobility Specialist Stop Time (ACUTE ONLY) 1030  Mobility Specialist Time Calculation (min) (ACUTE ONLY) 15 min   Pt received in bed, requesting assistance to [facility]. Required MinA to stand and transfer with no AD. Tolerated well, deferred further mobility d/t bowel movements and stool incontinence. Left pt with NT, all needs met.  Lawerance Bach Mobility Specialist Please contact via Special educational needs teacher or  Rehab office at [phone]

## 2023-08-13 NOTE — Plan of Care (Signed)

## 2023-08-13 NOTE — Discharge Instructions (Signed)
 1)Avoid ibuprofen/Advil/Aleve/Motrin/Goody Powders/Naproxen/BC powders/Meloxicam/Diclofenac/Indomethacin and other Nonsteroidal anti-inflammatory medications as these will make you more likely to bleed and can cause stomach ulcers, can also cause Kidney problems.   2)Very Low-salt diet advised---Less than 2 gm of Sodium per day advised----ok to use Mrs DASH salt substitute instead of Salt 3)Weigh yourself daily, call if you gain more than 3 pounds in 1 day or more than 5 pounds in 1 week as your diuretic medications may need to be adjusted 4)Limit your Fluid  intake to no more than 50 ounces (1.5 Liters) per day 5)Repeat CBC and BMP Blood Tests around Monday 08/19/23

## 2023-08-13 NOTE — Progress Notes (Signed)
 Report given to Blanchie Serve, LPN at [facility].

## 2023-08-13 NOTE — TOC Transition Note (Addendum)
 Transition of Care [facility]) - Discharge Note   Patient Details  Name: Roy [name] MRN: [id number] Date of Birth: 1951-01-02  Transition of Care [facility]) CM/SW Contact:  Karn Cassis, LCSW Phone Number: 08/13/2023, 12:48 PM   Clinical Narrative:  Pt d/c today to [facility]. SNF auth received. Pt and facility aware and agreeable. He reports he will notify family. Pt requests [facility] transport. LCSW to arrange. Rider waiver on chart. RN given number to call report. D/C summary sent to SNF.      Final next level of care: Skilled Nursing Facility Barriers to Discharge: Barriers Resolved   Patient Goals and CMS Choice Patient states their goals for this hospitalization and ongoing recovery are:: go to SNF CMS Medicare.gov Compare Post Acute Care list provided to:: Patient Choice offered to / list presented to : Patient      Discharge Placement                Patient to be transferred to facility by: [facility] Name of family member notified: pt only Patient and family notified of of transfer: 08/13/23  Discharge Plan and Services Additional resources added to the After Visit Summary for   In-house Referral: Clinical Social Work Discharge Planning Services: CM Consult Post Acute Care Choice: Skilled Nursing Facility                               Social Drivers of Health (SDOH) Interventions SDOH Screenings   Food Insecurity: No Food Insecurity (08/08/2023)  Housing: Low Risk  (08/08/2023)  Transportation Needs: No Transportation Needs (08/08/2023)  Utilities: Not At Risk (08/08/2023)  Alcohol Screen: Low Risk  (11/08/2020)  Depression (PHQ2-9): Low Risk  (11/08/2020)  Financial Resource Strain: Low Risk  (05/15/2023)   Received from [facility]  Physical Activity: Unknown (04/25/2023)   Received from [facility]  Recent Concern: Physical Activity - Inactive (04/25/2023)   Received from [facility]  Social Connections: Moderately Isolated (08/08/2023)  Stress:  No Stress Concern Present (04/25/2023)   Received from [facility]  Tobacco Use: Low Risk  (08/12/2023)     Readmission Risk Interventions    07/03/2023    8:45 AM 06/28/2023    7:56 AM 07/17/2021   12:49 PM  Readmission Risk Prevention Plan  Transportation Screening Complete Complete Complete  HRI or Home Care Consult Complete Complete Complete  Social Work Consult for Recovery Care Planning/Counseling Complete Complete Complete  Palliative Care Screening Not Applicable Not Applicable Not Applicable  Medication Review Oceanographer) Complete Complete Complete

## 2023-09-10 NOTE — Progress Notes (Signed)
 [facility] [address], [place] [address]    Clinic Day:  09/11/2023  Referring physician: Fonda Hymen, NP  Patient Care Team: Fonda Hymen, NP as PCP - General (Adult Health Nurse Practitioner) Lei Pump, MD as PCP - Electrophysiology (Cardiology) Paulett Boros, MD as Medical Oncologist (Medical Oncology)   ASSESSMENT & PLAN:   Assessment: 1.  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  High-grade lymphoma panel was negative. - Weight loss of 40 pounds in the last 4 months, stable weight over the last 1 month.  No fevers or night sweats.  -PET CT scan on 03/16/2021 which showed hypermetabolic enlarged spleen.  There are hypermetabolic neck lymph nodes.  Diffuse hypermetabolic activity within the marrow space of the spine.  Diffuse groundglass density within the lungs with moderate activity. - 6 cycles of R-CHOP from 04/13/2021 through 08/14/2021 - PET scan (09/22/2021): No evidence of metabolically active lymphoma, Deauville 1.    2.  Social/family history: - He lives at home by himself.  His son is present today with him. - He is independent of ADLs and IADLs.  Son lives within 30 minutes.  He was never smoker. - He has work-related exposure to dyes.  No pesticide exposure. - Brother died of bone cancer.  Another brother also died of bone cancer.    Plan: 1.  Diffuse large B-cell lymphoma, stage IVb: - Denies any fevers or night sweats.  He had weight fluctuation from recent hospitalizations and steroids.  He was hospitalized almost every month since January. - Physical exam: Did not reveal any palpable adenopathy. - Labs today: Normal LFTs and LDH.  CBC grossly normal with leukocytosis from steroids. - I have reviewed imaging studies from recent  hospitalization from 08/08/2023 and 07/12/2023: No evidence of lymphadenopathy.  Spleen size was normal. - He has a ulcer in front of the left tragus about 2 cm which is not healing.  He was told to follow-up with his dermatologist for biopsy. - RTC 6 months for follow-up with repeat labs and exam.    2.  Parotid masses: - PET scan showed bilateral parotid masses clinically consistent with Warthin's tumors. - He had to reschedule multiple biopsy appointments due to recurrent hospitalizations. - He would like to hold off on biopsies at this time.  He will reschedule them once he starts feeling stronger.    3.  Peripheral neuropathy: - Continue Lyrica  200 mg at bedtime.  4.  Adrenal insufficiency: - Continue prednisone  10 mg daily and fludrocortisone  0.1 mg daily.    No orders of the defined types were placed in this encounter.     I,Katie Daubenspeck,acting as a Neurosurgeon for Paulett Boros, MD.,have documented all relevant documentation on the behalf of Paulett Boros, MD,as directed by  Paulett Boros, MD while in the presence of Paulett Boros, MD.   I, Paulett Boros MD, have reviewed the above documentation for accuracy and completeness, and I agree with the above.   Paulett Boros, MD   5/7/20254:04 PM  CHIEF COMPLAINT:   Diagnosis: diffuse large B-cell lymphoma, stage IVb    Cancer Staging  Large B-cell lymphoma (HCC) Staging form: Hodgkin and Non-Hodgkin Lymphoma, AJCC 8th Edition - Clinical stage from 03/22/2021: Stage IV (Diffuse large B-cell lymphoma) - Signed by Paulett Boros, MD on 03/22/2021    Prior Therapy: 6  cycles of R-CHOP completed on 08/14/2021   Current Therapy:  Surveillance    HISTORY OF PRESENT ILLNESS:   Oncology History  Large B-cell lymphoma (HCC)  03/22/2021 Initial Diagnosis   DLBCL (diffuse large B cell lymphoma) (HCC)   03/22/2021 Cancer Staging   Staging form: Hodgkin and Non-Hodgkin Lymphoma, AJCC 8th  Edition - Clinical stage from 03/22/2021: Stage IV (Diffuse large B-cell lymphoma) - Signed by Paulett Boros, MD on 03/22/2021   04/12/2021 - 08/16/2021 Chemotherapy   Patient is on Treatment Plan : NON-HODGKINS LYMPHOMA R-CHOP q21d        INTERVAL HISTORY:   Roy [name] is a 73 y.o. male presenting to clinic today for follow up of diffuse large B-cell lymphoma, stage IVb. He was last seen by me on 04/08/23.  Since his last visit, he has had a few falls and several hospitalizations for various reasons. During his most recent visit on 08/08/23 for abdominal and hip pain, he underwent CT A/P, which showed normal spleen and no enlarged lymph nodes.  Today, he states that he is doing well overall. His appetite level is at 90%. His energy level is at 50%.  PAST MEDICAL HISTORY:   Past Medical History: Past Medical History:  Diagnosis Date   Atrial fibrillation (HCC)    Cancer (HCC)    Diffuse Large B-Cell lymphoma   GERD (gastroesophageal reflux disease)    High cholesterol    Hypertension    Obesity    Pacemaker     Surgical History: Past Surgical History:  Procedure Laterality Date   APPENDECTOMY     BIOPSY  06/28/2023   Procedure: BIOPSY;  Surgeon: Suzette Espy, MD;  Location: [facility];  Service: Endoscopy;;   CATARACT EXTRACTION Bilateral 2022   CHOLECYSTECTOMY     ESOPHAGEAL DILATION     multiple times   ESOPHAGOGASTRODUODENOSCOPY (EGD) WITH PROPOFOL  N/A 06/28/2023   Procedure: ESOPHAGOGASTRODUODENOSCOPY (EGD) WITH PROPOFOL ;  Surgeon: Suzette Espy, MD;  Location: [facility];  Service: Endoscopy;  Laterality: N/A;   IR IMAGING GUIDED PORT INSERTION  03/31/2021   JOINT REPLACEMENT Left    hip   LUMBAR PERCUTANEOUS PEDICLE SCREW 3 LEVEL  05/27/2023   Procedure: POSTERIOR FIXATION AND FUSION OF THORACIC ELEVEN-THORACIC TWELVE FRACTURE DISLOCATION. FIXATION FROM THORACIC TEN-THORACIC ELEVEN METHACRYLATE AUGMENTATION WITH FLUOROSCOPIC GUIDANCE;  Surgeon: Elna Haggis,  MD;  Location: [facility] OR;  Service: Neurosurgery;;   Londa Rival DILATION N/A 06/28/2023   Procedure: Dorothyann Gather;  Surgeon: Suzette Espy, MD;  Location: [facility];  Service: Endoscopy;  Laterality: N/A;   PACEMAKER IMPLANT N/A 09/07/2022   Procedure: PACEMAKER IMPLANT;  Surgeon: Lei Pump, MD;  Location: [facility] INVASIVE CV LAB;  Service: Cardiovascular;  Laterality: N/A;    Social History: Social History   Socioeconomic History   Marital status: Widowed    Spouse name: Not on file   Number of children: 3   Years of education: Not on file   Highest education level: Not on file  Occupational History   Occupation: Retired  Tobacco Use   Smoking status: Never    Passive exposure: Never   Smokeless tobacco: Never  Vaping Use   Vaping status: Never Used  Substance and Sexual Activity   Alcohol use: No   Drug use: No   Sexual activity: Not Currently  Other Topics Concern   Not on file  Social History Narrative   Not on file   Social Drivers of Health   Financial Resource Strain: Low  Risk  (05/15/2023)   Received from [facility]   Overall Financial Resource Strain (CARDIA)    Difficulty of Paying Living Expenses: Not very hard  Food Insecurity: No Food Insecurity (08/08/2023)   Hunger Vital Sign    Worried About Running Out of Food in the Last Year: Never true    Ran Out of Food in the Last Year: Never true  Transportation Needs: No Transportation Needs (08/08/2023)   PRAPARE - Administrator, Civil Service (Medical): No    Lack of Transportation (Non-Medical): No  Physical Activity: Unknown (04/25/2023)   Received from [facility]   Exercise Vital Sign    Days of Exercise per Week: 0 days    Minutes of Exercise per Session: Not on file  Recent Concern: Physical Activity - Inactive (04/25/2023)   Received from [facility]   Exercise Vital Sign    Days of Exercise per Week: 0 days    Minutes of Exercise per Session: 60 min  Stress: No Stress  Concern Present (04/25/2023)   Received from [facility] - Occupational Stress Questionnaire    Feeling of Stress : Not at all  Social Connections: Moderately Isolated (08/08/2023)   Social Connection and Isolation Panel [NHANES]    Frequency of Communication with Friends and Family: Three times a week    Frequency of Social Gatherings with Friends and Family: Once a week    Attends Religious Services: More than 4 times per year    Active Member of [employer] or Organizations: No    Attends Banker Meetings: Never    Marital Status: Widowed  Intimate Partner Violence: Not At Risk (08/08/2023)   Humiliation, Afraid, Rape, and Kick questionnaire    Fear of Current or Ex-Partner: No    Emotionally Abused: No    Physically Abused: No    Sexually Abused: No    Family History: Family History  Problem Relation Age of Onset   Heart failure Father    Heart attack Father        Deceased    Thyroid  disease Sister     Current Medications:  Current Outpatient Medications:    ELIQUIS  2.5 MG TABS tablet, Take 2.5 mg by mouth 2 (two) times daily., Disp: , Rfl:    traMADol  (ULTRAM ) 50 MG tablet, Take 50 mg by mouth every 6 (six) hours as needed., Disp: , Rfl:    acetaminophen  (TYLENOL ) 325 MG tablet, Take 2 tablets (650 mg total) by mouth every 6 (six) hours as needed for mild pain (pain score 1-3), fever or headache (or Fever >/= 101)., Disp: , Rfl:    calcium  carbonate (OS-CAL - DOSED IN MG OF ELEMENTAL CALCIUM ) 1250 (500 Ca) MG tablet, Take 1 tablet (1,250 mg total) by mouth 2 (two) times daily with a meal., Disp: , Rfl:    ferrous sulfate  325 (65 FE) MG tablet, Take 1 tablet (325 mg total) by mouth daily with breakfast., Disp: 30 tablet, Rfl: 0   furosemide  (LASIX ) 20 MG tablet, Take 20 mg by mouth daily., Disp: , Rfl:    GNP ZINC OXIDE 20 % ointment, Apply 1 Application topically every 8 (eight) hours as needed for irritation or diaper  changes., Disp: , Rfl:    guaiFENesin  (MUCINEX ) 600 MG 12 hr tablet, Take 1 tablet (600 mg total) by mouth 2 (two) times daily., Disp: 60 tablet, Rfl: 2   levothyroxine  (SYNTHROID ) 100 MCG tablet, Take 1 tablet (100  mcg total) by mouth daily before breakfast., Disp: 30 tablet, Rfl: 0   magnesium  oxide (MAG-OX) 400 (240 Mg) MG tablet, Take 1 tablet (400 mg total) by mouth daily., Disp: 30 tablet, Rfl: 0   methocarbamol  (ROBAXIN ) 500 MG tablet, Take 1 tablet (500 mg total) by mouth every 6 (six) hours as needed for muscle spasms., Disp: 20 tablet, Rfl: 0   midodrine  (PROAMATINE ) 10 MG tablet, Take 1 tablet (10 mg total) by mouth 3 (three) times daily., Disp: 90 tablet, Rfl: 3   pantoprazole  (PROTONIX ) 40 MG tablet, Take 1 tablet (40 mg total) by mouth daily., Disp: 30 tablet, Rfl: 5   polyethylene glycol (MIRALAX ) 17 g packet, Take 17 g by mouth daily., Disp: 30 each, Rfl: 4   Potassium Chloride  ER 20 MEQ TBCR, Take 1 tablet (20 mEq total) by mouth 2 (two) times daily. take while taking Lasix /furosemide , Disp: 60 tablet, Rfl: 1   predniSONE  (DELTASONE ) 10 MG tablet, Take 1 tablet (10 mg total) by mouth daily with breakfast., Disp: 30 tablet, Rfl: 0   rosuvastatin  (CRESTOR ) 5 MG tablet, Take 1 tablet (5 mg total) by mouth at bedtime., Disp: 30 tablet, Rfl: 0   senna-docusate (SENOKOT-S) 8.6-50 MG tablet, Take 2 tablets by mouth at bedtime., Disp: 60 tablet, Rfl: 1   valACYclovir  (VALTREX ) 1000 MG tablet, Take 1 tablet (1,000 mg total) by mouth daily., Disp: 30 tablet, Rfl: 0   vitamin B-12 (CYANOCOBALAMIN ) 1000 MCG tablet, Take 1 tablet (1,000 mcg total) by mouth daily., Disp: 30 tablet, Rfl: 3   Vitamin D , Ergocalciferol , (DRISDOL ) 1.25 MG (50000 UNIT) CAPS capsule, Take 1 capsule (50,000 Units total) by mouth every 7 (seven) days., Disp: 4 capsule, Rfl: 0   Allergies: Allergies  Allergen Reactions   Sulfa Antibiotics Other (See Comments)    Unknown childhood reaction   Adhesive [Tape] Other (See  Comments)    Contact dermatitis    REVIEW OF SYSTEMS:   Review of Systems  Constitutional:  Positive for fatigue. Negative for chills and fever.  HENT:   Negative for lump/mass, mouth sores, nosebleeds, sore throat and trouble swallowing.   Eyes:  Negative for eye problems.  Respiratory:  Positive for cough and shortness of breath.   Cardiovascular:  Negative for chest pain, leg swelling and palpitations.  Gastrointestinal:  Positive for constipation. Negative for abdominal pain, diarrhea, nausea and vomiting.  Genitourinary:  Negative for bladder incontinence, difficulty urinating, dysuria, frequency, hematuria and nocturia.   Musculoskeletal:  Negative for arthralgias, back pain, flank pain, myalgias and neck pain.  Skin:  Negative for itching and rash.  Neurological:  Negative for dizziness, headaches and numbness.  Hematological:  Does not bruise/bleed easily.  Psychiatric/Behavioral:  Positive for sleep disturbance. Negative for depression and suicidal ideas. The patient is not nervous/anxious.   All other systems reviewed and are negative.    VITALS:   Blood pressure (!) 133/90, height 6' (1.829 m).  Wt Readings from Last 3 Encounters:  09/11/23 255 lb 1.2 oz (115.7 kg)  08/13/23 261 lb 0.4 oz (118.4 kg)  07/17/23 227 lb 1.2 oz (103 kg)    Body mass index is 34.59 kg/m.  Performance status (ECOG): 1 - Symptomatic but completely ambulatory  PHYSICAL EXAM:   Physical Exam Vitals and nursing note reviewed. Exam conducted with a chaperone present.  Constitutional:      Appearance: Normal appearance.  Cardiovascular:     Rate and Rhythm: Normal rate and regular rhythm.     Pulses: Normal  pulses.     Heart sounds: Normal heart sounds.  Pulmonary:     Effort: Pulmonary effort is normal.     Breath sounds: Normal breath sounds.  Abdominal:     Palpations: Abdomen is soft. There is no hepatomegaly, splenomegaly or mass.     Tenderness: There is no abdominal tenderness.   Musculoskeletal:     Right lower leg: No edema.     Left lower leg: No edema.  Lymphadenopathy:     Cervical: No cervical adenopathy.     Right cervical: No superficial, deep or posterior cervical adenopathy.    Left cervical: No superficial, deep or posterior cervical adenopathy.     Upper Body:     Right upper body: No supraclavicular or axillary adenopathy.     Left upper body: No supraclavicular or axillary adenopathy.  Neurological:     General: No focal deficit present.     Mental Status: He is alert and oriented to person, place, and time.  Psychiatric:        Mood and Affect: Mood normal.        Behavior: Behavior normal.     LABS:   CBC     Component Value Date/Time   WBC 15.0 (H) 09/11/2023 0920   RBC 5.31 09/11/2023 0920   HGB 15.1 09/11/2023 0920   HCT 47.7 09/11/2023 0920   PLT 254 09/11/2023 0920   MCV 89.8 09/11/2023 0920   MCH 28.4 09/11/2023 0920   MCHC 31.7 09/11/2023 0920   RDW 14.5 09/11/2023 0920   LYMPHSABS 3.2 09/11/2023 0920   MONOABS 0.9 09/11/2023 0920   EOSABS 0.3 09/11/2023 0920   BASOSABS 0.1 09/11/2023 0920    CMP      Component Value Date/Time   NA 136 09/11/2023 0920   NA 143 10/04/2022 1308   K 3.8 09/11/2023 0920   CL 96 (L) 09/11/2023 0920   CO2 28 09/11/2023 0920   GLUCOSE 103 (H) 09/11/2023 0920   BUN 11 09/11/2023 0920   BUN 11 10/04/2022 1308   CREATININE 0.60 (L) 09/11/2023 0920   CREATININE 0.82 11/11/2020 1046   CALCIUM  9.5 09/11/2023 0920   PROT 6.9 09/11/2023 0920   PROT 6.1 10/04/2022 1308   ALBUMIN 3.7 09/11/2023 0920   ALBUMIN 4.1 10/04/2022 1308   AST 29 09/11/2023 0920   AST 74 (H) 11/11/2020 1046   ALT 23 09/11/2023 0920   ALT 21 11/11/2020 1046   ALKPHOS 122 09/11/2023 0920   BILITOT 1.0 09/11/2023 0920   BILITOT 0.9 10/04/2022 1308   BILITOT 2.3 (H) 11/11/2020 1046   GFRNONAA >60 09/11/2023 0920   GFRNONAA >60 11/11/2020 1046   GFRAA >60 04/29/2019 1033     No results found for: "CEA1",  "CEA" / No results found for: "CEA1", "CEA" No results found for: "PSA1" No results found for: "CAN199" No results found for: "CAN125"  Lab Results  Component Value Date   TOTALPROTELP 6.1 02/28/2021   ALBUMINELP 3.5 02/21/2021   A1GS 0.3 02/21/2021   A2GS 0.8 02/21/2021   BETS 1.0 02/21/2021   GAMS 0.8 02/21/2021   MSPIKE Not Observed 02/21/2021   SPEI Comment 02/21/2021   Lab Results  Component Value Date   TIBC 416 02/21/2021   TIBC 369 11/11/2020   FERRITIN 152 02/21/2021   IRONPCTSAT 17 (L) 02/21/2021   IRONPCTSAT 41 (H) 11/11/2020   Lab Results  Component Value Date   LDH 139 09/11/2023   LDH 146 04/01/2023   LDH 173 11/20/2022  STUDIES:   No results found.

## 2023-09-11 ENCOUNTER — Other Ambulatory Visit

## 2023-09-11 ENCOUNTER — Inpatient Hospital Stay: Attending: Hematology | Admitting: Hematology

## 2023-09-11 ENCOUNTER — Inpatient Hospital Stay

## 2023-09-11 VITALS — BP 131/95 | HR 87 | Temp 97.0°F | Resp 20 | Wt 255.1 lb

## 2023-09-11 VITALS — BP 133/90 | Ht 72.0 in

## 2023-09-11 DIAGNOSIS — G629 Polyneuropathy, unspecified: Secondary | ICD-10-CM | POA: Diagnosis not present

## 2023-09-11 DIAGNOSIS — C8338 Diffuse large B-cell lymphoma, lymph nodes of multiple sites: Secondary | ICD-10-CM | POA: Insufficient documentation

## 2023-09-11 DIAGNOSIS — Z79899 Other long term (current) drug therapy: Secondary | ICD-10-CM | POA: Insufficient documentation

## 2023-09-11 DIAGNOSIS — Z7952 Long term (current) use of systemic steroids: Secondary | ICD-10-CM | POA: Diagnosis not present

## 2023-09-11 DIAGNOSIS — C8510 Unspecified B-cell lymphoma, unspecified site: Secondary | ICD-10-CM

## 2023-09-11 DIAGNOSIS — Z8349 Family history of other endocrine, nutritional and metabolic diseases: Secondary | ICD-10-CM | POA: Diagnosis not present

## 2023-09-11 DIAGNOSIS — K118 Other diseases of salivary glands: Secondary | ICD-10-CM | POA: Insufficient documentation

## 2023-09-11 DIAGNOSIS — E2740 Unspecified adrenocortical insufficiency: Secondary | ICD-10-CM | POA: Diagnosis not present

## 2023-09-11 DIAGNOSIS — Z95828 Presence of other vascular implants and grafts: Secondary | ICD-10-CM

## 2023-09-11 LAB — LACTATE DEHYDROGENASE: LDH: 139 U/L (ref 98–192)

## 2023-09-11 LAB — COMPREHENSIVE METABOLIC PANEL WITH GFR
ALT: 23 U/L (ref 0–44)
AST: 29 U/L (ref 15–41)
Albumin: 3.7 g/dL (ref 3.5–5.0)
Alkaline Phosphatase: 122 U/L (ref 38–126)
Anion gap: 12 (ref 5–15)
BUN: 11 mg/dL (ref 8–23)
CO2: 28 mmol/L (ref 22–32)
Calcium: 9.5 mg/dL (ref 8.9–10.3)
Chloride: 96 mmol/L — ABNORMAL LOW (ref 98–111)
Creatinine, Ser: 0.6 mg/dL — ABNORMAL LOW (ref 0.61–1.24)
GFR, Estimated: >60 mL/min (ref >60)
Glucose, Bld: 103 mg/dL — ABNORMAL HIGH (ref 70–99)
Potassium: 3.8 mmol/L (ref 3.5–5.1)
Sodium: 136 mmol/L (ref 135–145)
Total Bilirubin: 1 mg/dL (ref 0.0–1.2)
Total Protein: 6.9 g/dL (ref 6.5–8.1)

## 2023-09-11 LAB — CBC WITH DIFFERENTIAL/PLATELET
Abs Immature Granulocytes: 0.07 10*3/uL (ref 0.00–0.07)
Basophils Absolute: 0.1 10*3/uL (ref 0.0–0.1)
Basophils Relative: 1 %
Eosinophils Absolute: 0.3 10*3/uL (ref 0.0–0.5)
Eosinophils Relative: 2 %
HCT: 47.7 % (ref 39.0–52.0)
Hemoglobin: 15.1 g/dL (ref 13.0–17.0)
Immature Granulocytes: 1 %
Lymphocytes Relative: 22 %
Lymphs Abs: 3.2 10*3/uL (ref 0.7–4.0)
MCH: 28.4 pg (ref 26.0–34.0)
MCHC: 31.7 g/dL (ref 30.0–36.0)
MCV: 89.8 fL (ref 80.0–100.0)
Monocytes Absolute: 0.9 10*3/uL (ref 0.1–1.0)
Monocytes Relative: 6 %
Neutro Abs: 10.4 10*3/uL — ABNORMAL HIGH (ref 1.7–7.7)
Neutrophils Relative %: 68 %
Platelets: 254 10*3/uL (ref 150–400)
RBC: 5.31 MIL/uL (ref 4.22–5.81)
RDW: 14.5 % (ref 11.5–15.5)
WBC: 15 10*3/uL — ABNORMAL HIGH (ref 4.0–10.5)
nRBC: 0 % (ref 0.0–0.2)

## 2023-09-11 MED ORDER — SODIUM CHLORIDE 0.9% FLUSH
10.0000 mL | INTRAVENOUS | Status: DC | PRN
  Administered 2023-09-11: 10 mL via INTRAVENOUS

## 2023-09-11 MED ORDER — HEPARIN SOD (PORK) LOCK FLUSH 100 UNIT/ML IV SOLN
500.0000 [IU] | Freq: Once | INTRAVENOUS | Status: AC
  Administered 2023-09-11: 500 [IU] via INTRAVENOUS

## 2023-09-11 NOTE — Progress Notes (Signed)
 Patients port flushed without difficulty.  Good blood return noted with no bruising or swelling noted at site.  Band aid applied.  VSS with discharge and left in satisfactory condition with no s/s of distress noted.

## 2023-09-11 NOTE — Patient Instructions (Addendum)
 [facility] Discharge Instructions   You were seen and examined today by Dr. Cheree Cords.  He reviewed the results of your lab work which are normal/stable.   Please see your dermatologist regarding the spot on the left side of your face.   We will see you back in 6 months. We will repeat lab work prior to this visit.   Return as scheduled.    Thank you for choosing [facility] to provide your oncology and hematology care.  To afford each patient quality time with our provider, please arrive at least 15 minutes before your scheduled appointment time.   If you have a lab appointment with the Cancer Center please come in thru the Main Entrance and check in at the main information desk.  You need to re-schedule your appointment should you arrive 10 or more minutes late.  We strive to give you quality time with our providers, and arriving late affects you and other patients whose appointments are after yours.  Also, if you no show three or more times for appointments you may be dismissed from the clinic at the providers discretion.     Again, thank you for choosing [facility].  Our hope is that these requests will decrease the amount of time that you wait before being seen by our physicians.       _____________________________________________________________  Should you have questions after your visit to [facility], please contact our office at [phone] and follow the prompts.  Our office hours are 8:00 a.m. and 4:30 p.m. Monday - Friday.  Please note that voicemails left after 4:00 p.m. may not be returned until the following business day.  We are closed weekends and major holidays.  You do have access to a nurse 24-7, just call the main number to the clinic [phone] and do not press any options, hold on the line and a nurse will answer the phone.    For prescription refill requests,  have your pharmacy contact our office and allow 72 hours.    Due to Covid, you will need to wear a mask upon entering the hospital. If you do not have a mask, a mask will be given to you at the Main Entrance upon arrival. For doctor visits, patients may have 1 support person age 23 or older with them. For treatment visits, patients can not have anyone with them due to social distancing guidelines and our immunocompromised population.

## 2023-09-23 ENCOUNTER — Ambulatory Visit: Payer: 59

## 2023-09-23 DIAGNOSIS — I442 Atrioventricular block, complete: Secondary | ICD-10-CM | POA: Diagnosis not present

## 2023-09-24 LAB — CUP PACEART REMOTE DEVICE CHECK
Battery Remaining Longevity: 137 mo
Battery Voltage: 3.04 V
Brady Statistic RA Percent Paced: 0 %
Brady Statistic RV Percent Paced: 97.48 %
Date Time Interrogation Session: 20250519052957
Implantable Lead Connection Status: 753985
Implantable Lead Connection Status: 753985
Implantable Lead Implant Date: 20240503
Implantable Lead Implant Date: 20240503
Implantable Lead Location: 753859
Implantable Lead Location: 753860
Implantable Lead Model: 3830
Implantable Lead Model: 5076
Implantable Pulse Generator Implant Date: 20240503
Lead Channel Impedance Value: 380 Ohm
Lead Channel Impedance Value: 399 Ohm
Lead Channel Impedance Value: 570 Ohm
Lead Channel Impedance Value: 627 Ohm
Lead Channel Pacing Threshold Amplitude: 1 V
Lead Channel Pacing Threshold Pulse Width: 0.4 ms
Lead Channel Sensing Intrinsic Amplitude: 1.25 mV
Lead Channel Sensing Intrinsic Amplitude: 1.25 mV
Lead Channel Sensing Intrinsic Amplitude: 13.375 mV
Lead Channel Sensing Intrinsic Amplitude: 13.375 mV
Lead Channel Setting Pacing Amplitude: 2.5 V
Lead Channel Setting Pacing Amplitude: 3.5 V
Lead Channel Setting Pacing Pulse Width: 0.4 ms
Lead Channel Setting Sensing Sensitivity: 2.8 mV
Zone Setting Status: 755011
Zone Setting Status: 755011

## 2023-09-26 ENCOUNTER — Ambulatory Visit: Payer: Self-pay | Admitting: Cardiology

## 2023-10-12 LAB — COMPREHENSIVE METABOLIC PANEL WITH GFR
ALT: 15 IU/L (ref 0–44)
AST: 28 IU/L (ref 0–40)
Albumin: 4.3 g/dL (ref 3.8–4.8)
Alkaline Phosphatase: 165 IU/L — ABNORMAL HIGH (ref 44–121)
BUN/Creatinine Ratio: 16 (ref 10–24)
BUN: 14 mg/dL (ref 8–27)
Bilirubin Total: 0.8 mg/dL (ref 0.0–1.2)
CO2: 25 mmol/L (ref 20–29)
Calcium: 10.3 mg/dL — ABNORMAL HIGH (ref 8.6–10.2)
Chloride: 98 mmol/L (ref 96–106)
Creatinine, Ser: 0.89 mg/dL (ref 0.76–1.27)
Globulin, Total: 2.4 g/dL (ref 1.5–4.5)
Glucose: 103 mg/dL — ABNORMAL HIGH (ref 70–99)
Potassium: 4 mmol/L (ref 3.5–5.2)
Sodium: 141 mmol/L (ref 134–144)
Total Protein: 6.7 g/dL (ref 6.0–8.5)
eGFR: 90 mL/min/{1.73_m2} (ref >59)

## 2023-10-12 LAB — T4, FREE: Free T4: 1.5 ng/dL (ref 0.82–1.77)

## 2023-10-12 LAB — TSH: TSH: 1.34 u[IU]/mL (ref 0.450–4.500)

## 2023-10-15 ENCOUNTER — Ambulatory Visit (INDEPENDENT_AMBULATORY_CARE_PROVIDER_SITE_OTHER): Payer: 59 | Admitting: "Endocrinology

## 2023-10-15 ENCOUNTER — Encounter: Payer: Self-pay | Admitting: "Endocrinology

## 2023-10-15 VITALS — BP 132/88 | HR 76 | Ht 72.0 in | Wt 251.8 lb

## 2023-10-15 DIAGNOSIS — E782 Mixed hyperlipidemia: Secondary | ICD-10-CM

## 2023-10-15 DIAGNOSIS — E039 Hypothyroidism, unspecified: Secondary | ICD-10-CM

## 2023-10-15 DIAGNOSIS — E876 Hypokalemia: Secondary | ICD-10-CM | POA: Diagnosis not present

## 2023-10-15 DIAGNOSIS — E2740 Unspecified adrenocortical insufficiency: Secondary | ICD-10-CM

## 2023-10-15 NOTE — Progress Notes (Signed)
 10/15/2023, 11:19 AM   Subjective:    Patient ID: Roy [name], male    DOB: [birth year], PCP Fonda Hymen, NP   Past Medical History:  Diagnosis Date   Atrial fibrillation (HCC)    Cancer (HCC)    Diffuse Large B-Cell lymphoma   GERD (gastroesophageal reflux disease)    High cholesterol    Hypertension    Obesity    Pacemaker    Past Surgical History:  Procedure Laterality Date   APPENDECTOMY     BIOPSY  06/28/2023   Procedure: BIOPSY;  Surgeon: Suzette Espy, MD;  Location: [facility];  Service: Endoscopy;;   CATARACT EXTRACTION Bilateral 2022   CHOLECYSTECTOMY     ESOPHAGEAL DILATION     multiple times   ESOPHAGOGASTRODUODENOSCOPY (EGD) WITH PROPOFOL  N/A 06/28/2023   Procedure: ESOPHAGOGASTRODUODENOSCOPY (EGD) WITH PROPOFOL ;  Surgeon: Suzette Espy, MD;  Location: [facility];  Service: Endoscopy;  Laterality: N/A;   IR IMAGING GUIDED PORT INSERTION  03/31/2021   JOINT REPLACEMENT Left    hip   LUMBAR PERCUTANEOUS PEDICLE SCREW 3 LEVEL  05/27/2023   Procedure: POSTERIOR FIXATION AND FUSION OF THORACIC ELEVEN-THORACIC TWELVE FRACTURE DISLOCATION. FIXATION FROM THORACIC TEN-THORACIC ELEVEN METHACRYLATE AUGMENTATION WITH FLUOROSCOPIC GUIDANCE;  Surgeon: Elna Haggis, MD;  Location: [facility] OR;  Service: Neurosurgery;;   Londa Rival DILATION N/A 06/28/2023   Procedure: Dorothyann Gather;  Surgeon: Suzette Espy, MD;  Location: [facility];  Service: Endoscopy;  Laterality: N/A;   PACEMAKER IMPLANT N/A 09/07/2022   Procedure: PACEMAKER IMPLANT;  Surgeon: Lei Pump, MD;  Location: [facility] INVASIVE CV LAB;  Service: Cardiovascular;  Laterality: N/A;   Social History   Socioeconomic History   Marital status: Widowed    Spouse name: Not on file   Number of children: 3   Years of education: Not on file   Highest education level: Not on file  Occupational History   Occupation: Retired  Tobacco Use   Smoking  status: Never    Passive exposure: Never   Smokeless tobacco: Never  Vaping Use   Vaping status: Never Used  Substance and Sexual Activity   Alcohol use: No   Drug use: No   Sexual activity: Not Currently  Other Topics Concern   Not on file  Social History Narrative   Not on file   Social Drivers of Health   Financial Resource Strain: Low Risk  (05/15/2023)   Received from [facility]   Overall Financial Resource Strain (CARDIA)    Difficulty of Paying Living Expenses: Not very hard  Food Insecurity: No Food Insecurity (08/08/2023)   Hunger Vital Sign    Worried About Running Out of Food in the Last Year: Never true    Ran Out of Food in the Last Year: Never true  Transportation Needs: No Transportation Needs (08/08/2023)   PRAPARE - Administrator, Civil Service (Medical): No    Lack of Transportation (Non-Medical): No  Physical Activity: Unknown (04/25/2023)   Received from [facility]   Exercise Vital Sign    Days of Exercise per Week: 0 days    Minutes of Exercise per Session: Not on file  Recent Concern: Physical Activity - Inactive (04/25/2023)  Received from [facility]   Exercise Vital Sign    Days of Exercise per Week: 0 days    Minutes of Exercise per Session: 60 min  Stress: No Stress Concern Present (04/25/2023)   Received from [facility] of Occupational Health - Occupational Stress Questionnaire    Feeling of Stress : Not at all  Social Connections: Moderately Isolated (08/08/2023)   Social Connection and Isolation Panel [NHANES]    Frequency of Communication with Friends and Family: Three times a week    Frequency of Social Gatherings with Friends and Family: Once a week    Attends Religious Services: More than 4 times per year    Active Member of [employer] or Organizations: No    Attends Banker Meetings: Never    Marital Status: Widowed   Family History  Problem Relation Age of Onset   Heart failure Father     Heart attack Father        Deceased    Thyroid  disease Sister    Outpatient Encounter Medications as of 10/15/2023  Medication Sig   acetaminophen  (TYLENOL ) 325 MG tablet Take 2 tablets (650 mg total) by mouth every 6 (six) hours as needed for mild pain (pain score 1-3), fever or headache (or Fever >/= 101).   calcium  carbonate (OS-CAL - DOSED IN MG OF ELEMENTAL CALCIUM ) 1250 (500 Ca) MG tablet Take 1 tablet (1,250 mg total) by mouth 2 (two) times daily with a meal.   ELIQUIS  2.5 MG TABS tablet Take 2.5 mg by mouth 2 (two) times daily.   ferrous sulfate  325 (65 FE) MG tablet Take 1 tablet (325 mg total) by mouth daily with breakfast.   furosemide  (LASIX ) 20 MG tablet Take 20 mg by mouth daily.   GNP ZINC OXIDE 20 % ointment Apply 1 Application topically every 8 (eight) hours as needed for irritation or diaper changes.   guaiFENesin  (MUCINEX ) 600 MG 12 hr tablet Take 1 tablet (600 mg total) by mouth 2 (two) times daily.   levothyroxine  (SYNTHROID ) 100 MCG tablet Take 1 tablet (100 mcg total) by mouth daily before breakfast.   magnesium  oxide (MAG-OX) 400 (240 Mg) MG tablet Take 1 tablet (400 mg total) by mouth daily.   methocarbamol  (ROBAXIN ) 500 MG tablet Take 1 tablet (500 mg total) by mouth every 6 (six) hours as needed for muscle spasms.   midodrine  (PROAMATINE ) 10 MG tablet Take 1 tablet (10 mg total) by mouth 3 (three) times daily.   pantoprazole  (PROTONIX ) 40 MG tablet Take 1 tablet (40 mg total) by mouth daily.   polyethylene glycol (MIRALAX ) 17 g packet Take 17 g by mouth daily.   Potassium Chloride  ER 20 MEQ TBCR Take 1 tablet (20 mEq total) by mouth 2 (two) times daily. take while taking Lasix /furosemide    predniSONE  (DELTASONE ) 10 MG tablet Take 1 tablet (10 mg total) by mouth daily with breakfast.   rosuvastatin  (CRESTOR ) 5 MG tablet Take 1 tablet (5 mg total) by mouth at bedtime.   senna-docusate (SENOKOT-S) 8.6-50 MG tablet Take 2 tablets by mouth at bedtime.   traMADol   (ULTRAM ) 50 MG tablet Take 50 mg by mouth every 6 (six) hours as needed.   valACYclovir  (VALTREX ) 1000 MG tablet Take 1 tablet (1,000 mg total) by mouth daily.   vitamin B-12 (CYANOCOBALAMIN ) 1000 MCG tablet Take 1 tablet (1,000 mcg total) by mouth daily.   Vitamin D , Ergocalciferol , (DRISDOL ) 1.25 MG (50000 UNIT) CAPS capsule Take 1 capsule (50,000  Units total) by mouth every 7 (seven) days.   No facility-administered encounter medications on file as of 10/15/2023.   ALLERGIES: Allergies  Allergen Reactions   Sulfa Antibiotics Other (See Comments)    Unknown childhood reaction   Adhesive [Tape] Other (See Comments)    Contact dermatitis    VACCINATION STATUS: Immunization History  Administered Date(s) Administered   Influenza,inj,Quad PF,6+ Mos 04/28/2015   Moderna Sars-Covid-2 Vaccination 07/03/2019, 07/31/2019   PFIZER(Purple Top)SARS-COV-2 Vaccination 03/25/2020   Pfizer Covid-19 Vaccine Bivalent Booster 70yrs & up 08/24/2021   Pneumococcal Polysaccharide-23 04/28/2015   Tdap 11/10/2022    HPI JAMESYN LINDELL is 73 y.o. male who presents today with a medical history as above. he is being seen in follow-up after he was seen in consultation for adrenal insufficiency requested by Fonda Hymen, NP.    He also has hypothyroidism on levothyroxine .  His previsit thyroid  function tests are consistent with appropriate replacement.    His previous ACTH  stimulation test confirmed primary adrenal insufficiency.    He remains on prednisone  10 mg p.o. daily for breakfast.  In the interval, he was taken off of fludrocortisone  due to hypokalemia.     More recently, he has seen his blood pressure stabilizing off of midodrine .      He did receive cardiac pacemaker recently.      He denies any history of head injury, visual field deficits, no headaches.  He denies any prior history of abdominal surgery, injury, nor infections.  He presents with 40+ pounds of weight loss since last  visit.  This happened during his recent hospitalization for  a fall and hip pain. He still ambulates with a walker.  His other medical problems include hyperlipidemia on treatment with Crestor .  He has paroxysmal atrial fibrillation, left anterior fascicular block, currently on anticoagulation using Eliquis  2.5 mg twice a day.      Review of Systems  Constitutional: + Recent weight loss, + fatigue, no subjective hyperthermia, no subjective hypothermia  Objective:       10/15/2023    9:13 AM 09/11/2023    9:43 AM 09/11/2023    9:39 AM  Vitals with BMI  Height 6\' 0"   6\' 0"   Weight 251 lbs 13 oz    BMI 34.14    Systolic 132 133   Diastolic 88 90   Pulse 76      BP 132/88   Pulse 76   Ht 6' (1.829 m)   Wt 251 lb 12.8 oz (114.2 kg)   BMI 34.15 kg/m   Wt Readings from Last 3 Encounters:  10/15/23 251 lb 12.8 oz (114.2 kg)  09/11/23 255 lb 1.2 oz (115.7 kg)  08/13/23 261 lb 0.4 oz (118.4 kg)    Physical Exam  Constitutional:  Body mass index is 34.15 kg/m.,  not in acute distress, normal state of mind, +  walks with a cane   CMP ( most recent) CMP     Component Value Date/Time   NA 141 10/10/2023 1131   K 4.0 10/10/2023 1131   CL 98 10/10/2023 1131   CO2 25 10/10/2023 1131   GLUCOSE 103 (H) 10/10/2023 1131   GLUCOSE 103 (H) 09/11/2023 0920   BUN 14 10/10/2023 1131   CREATININE 0.89 10/10/2023 1131   CREATININE 0.82 11/11/2020 1046   CALCIUM  10.3 (H) 10/10/2023 1131   PROT 6.7 10/10/2023 1131   ALBUMIN 4.3 10/10/2023 1131   AST 28 10/10/2023 1131   AST 74 (H) 11/11/2020 1046  ALT 15 10/10/2023 1131   ALT 21 11/11/2020 1046   ALKPHOS 165 (H) 10/10/2023 1131   BILITOT 0.8 10/10/2023 1131   BILITOT 2.3 (H) 11/11/2020 1046   GFRNONAA >60 09/11/2023 0920   GFRNONAA >60 11/11/2020 1046   GFRAA >60 04/29/2019 1033     Diabetic Labs (most recent): Lab Results  Component Value Date   HGBA1C 5.6 10/11/2022   HGBA1C 5.1 01/08/2021     Lipid Panel ( most  recent) Lipid Panel     Component Value Date/Time   CHOL 125 04/11/2023 1331   TRIG 107 04/11/2023 1331   HDL 45 04/11/2023 1331   CHOLHDL 2.8 04/11/2023 1331   CHOLHDL 7.7 04/29/2015 0426   VLDL 31 04/29/2015 0426   LDLCALC 60 04/11/2023 1331   LABVLDL 20 04/11/2023 1331      Lab Results  Component Value Date   TSH 1.340 10/10/2023   TSH 6.886 (H) 06/25/2023   TSH 1.870 04/11/2023   TSH 1.770 10/04/2022   TSH 1.541 09/05/2022   TSH 1.863 02/20/2022   TSH 10.200 (H) 10/03/2021   TSH 8.100 (H) 05/30/2021   TSH 0.770 02/23/2021   TSH 0.791 02/21/2021   FREET4 1.50 10/10/2023   FREET4 1.32 04/11/2023   FREET4 1.10 10/04/2022   FREET4 0.88 02/20/2022   FREET4 1.13 10/03/2021   FREET4 0.75 (L) 05/30/2021   FREET4 0.31 (L) 02/21/2021   FREET4 0.74 01/09/2021   FREET4 1.12 11/09/2020   FREET4 1.75 (H) 10/21/2020       Assessment & Plan:   1. Adrenal insufficiency (HCC) 2.  Hypothyroidism 3.  Hyperlipidemia  -He does have previously confirmed renal insufficiency which will need long-term glucocorticoid replacement.  He is advised to continue prednisone  10 mg p.o. daily with breakfast. - Fludrocortisone  was discontinued due to recent hypokalemia. - His blood pressure is stabilizing at 132/88 mmHg, back on midodrine  10 mg p.o. 3 times daily.   We discussed about sick day rules on prednisone .  -I have emphasized the absolute need for steroid replacement on a daily basis for long-term.  Regarding his hypothyroidism: He is previsit thyroid  function tests are consistent with appropriate replacement.  He is advised to continue levothyroxine  100 mcg p.o. daily before breakfast.     - We discussed about the correct intake of his thyroid  hormone, on empty stomach at fasting, with water, separated by at least 30 minutes from breakfast and other medications,  and separated by more than 4 hours from calcium , iron, multivitamins, acid reflux medications (PPIs). -Patient is made  aware of the fact that thyroid  hormone replacement is needed for life, dose to be adjusted by periodic monitoring of thyroid  function tests.  His previsit labs showed normal CMP with sodium 141, potassium 4.0.  -He is advised on fall precaution, encouraged to use his walker at all times for equilibrium.   Regarding his hyperlipidemia: His recent LDL is 97.  He is advised to continue Crestor  5 mg p.o. nightly.  His recent A1c was 5.6% indicating absence of prediabetes/diabetes.     - he is advised to maintain close follow up with Roy [name], Roy Shines, NP for primary care needs.   I spent  26  minutes in the care of the patient today including review of labs from Thyroid  Function, CMP, and other relevant labs ; imaging/biopsy records (current and previous including abstractions from other facilities); face-to-face time discussing  his lab results and symptoms, medications doses, his options of short and long term treatment based  on the latest standards of care / guidelines;   and documenting the encounter.  Roy [name]  participated in the discussions, expressed understanding, and voiced agreement with the above plans.  All questions were answered to his satisfaction. he is encouraged to contact clinic should he have any questions or concerns prior to his return visit.   Follow up plan: Return in about 6 months (around 04/15/2024) for F/U with Pre-visit Labs.   Kalvin Orf, MD [facility] [address] [place], [place] [address] Phone: [phone]  Fax: [phone]     10/15/2023, 11:19 AM  This note was partially dictated with voice recognition software. Similar sounding words can be transcribed inadequately or may not  be corrected upon review.  Dexamethasone 

## 2023-10-17 IMAGING — CT CT NECK W/ CM
3 of 4 series · 11 of 35 positions shown, 13 images · IV contrast (Omnipaque or Isovue)
Comparison: PET scan 03/16/2021

CLINICAL DATA: Parotid mass.

EXAM:
CT NECK WITH CONTRAST
TECHNIQUE: Multidetector CT imaging of the neck was performed using the
standard protocol following the bolus administration of intravenous
contrast.
CONTRAST:  75mL OMNIPAQUE IOHEXOL 300 MG/ML  SOLN

[Series 4: cor neck · coronal · 0.46mm/px · 3 of 198 slices shown]
[im 40/198  bone]
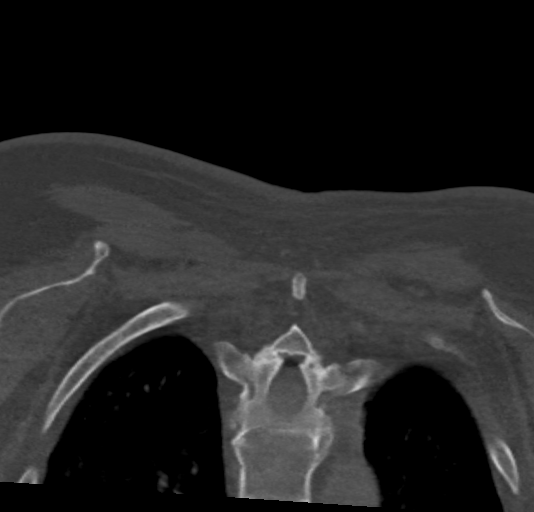
[im 79/198  bone]
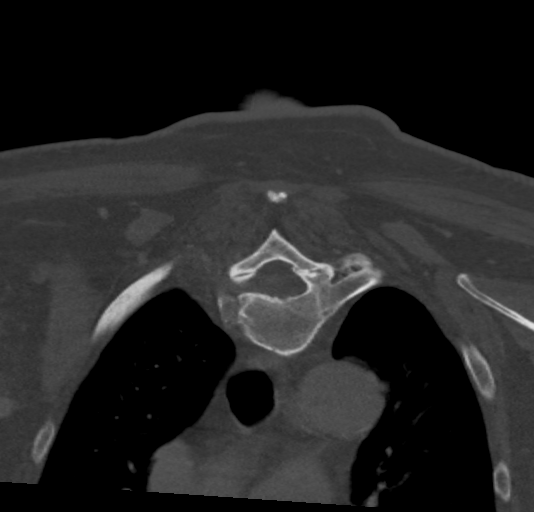
[im 119/198  bone]
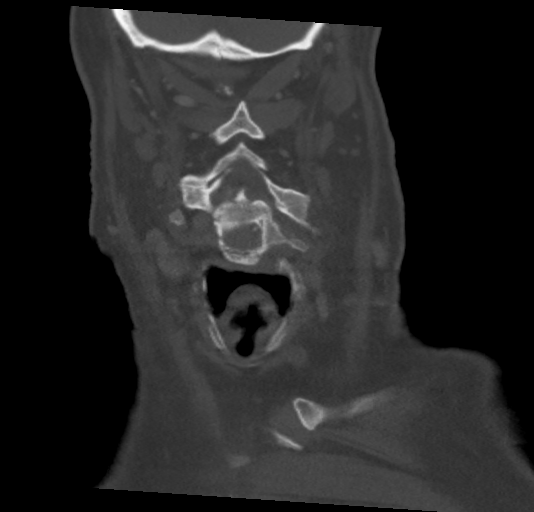

[Series 5: sag neck · sagittal · 0.48mm/px · 5 of 101 slices shown, 6 images]
[im 34/101  bone]
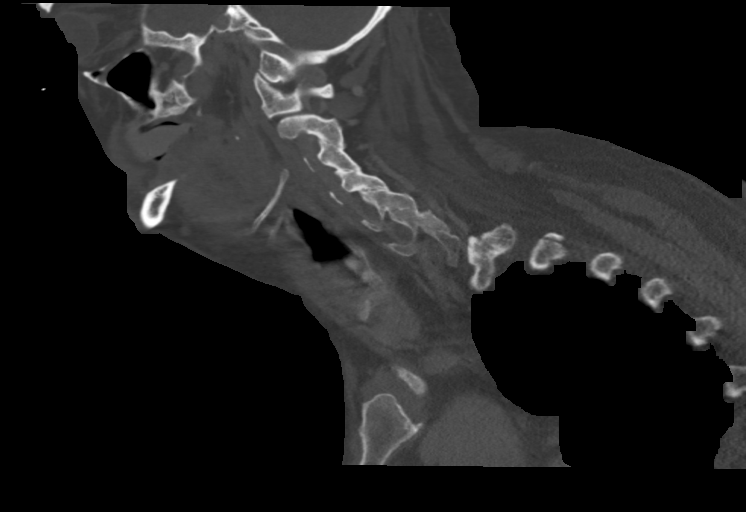
[im 42/101  bone]
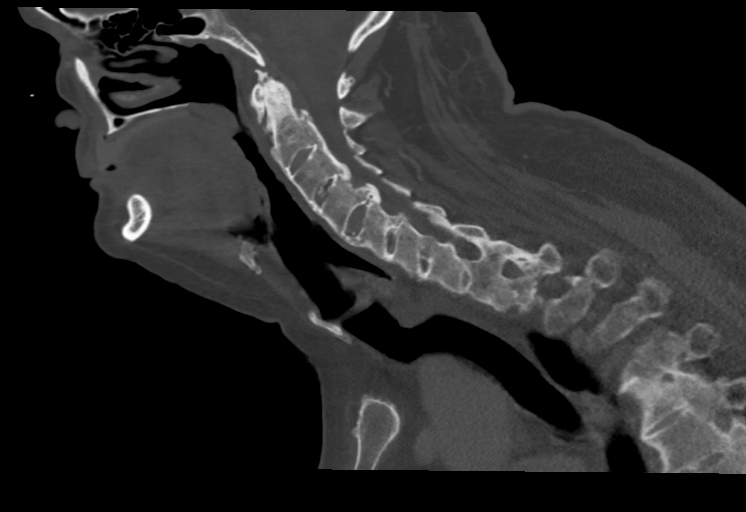
[im 51/101  soft-tissue]
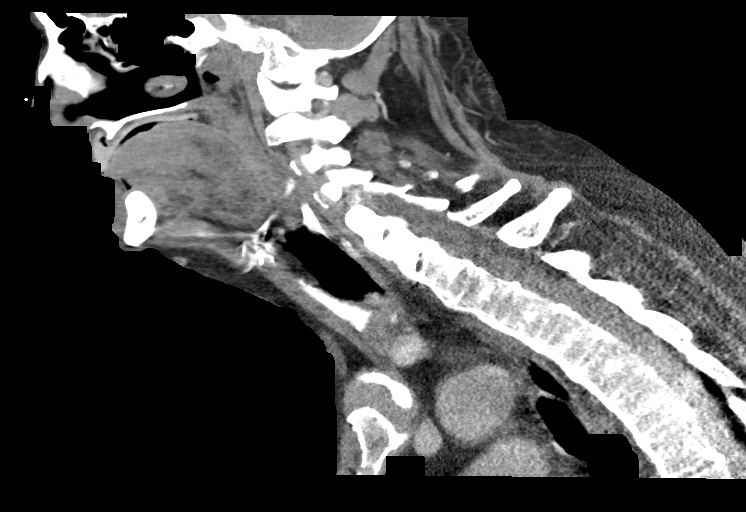
[im 51/101  bone]
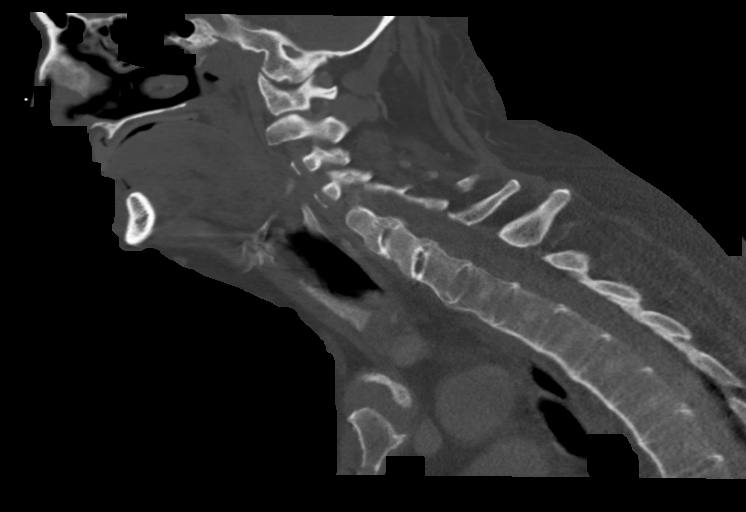
[im 59/101  bone]
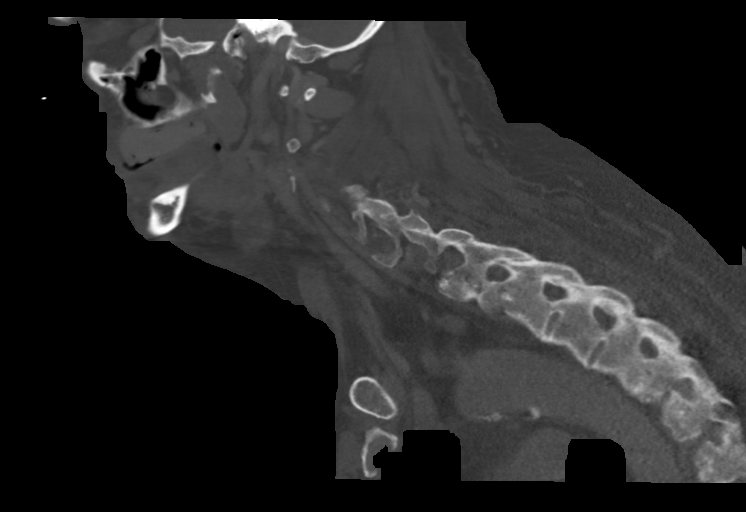
[im 67/101  bone]
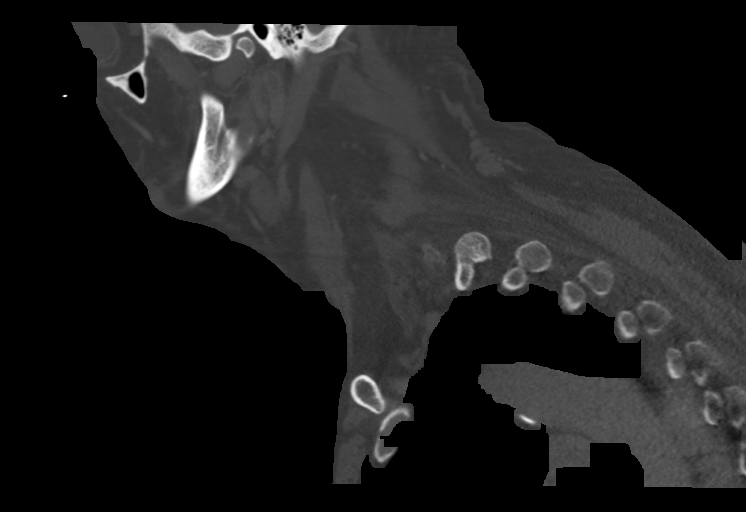

[Series 6: ax oropharynx · axial · 0.39mm/px · z∈[-163,-11]mm · 3 of 114 slices shown, 4 images]
[im 19/114  soft-tissue]
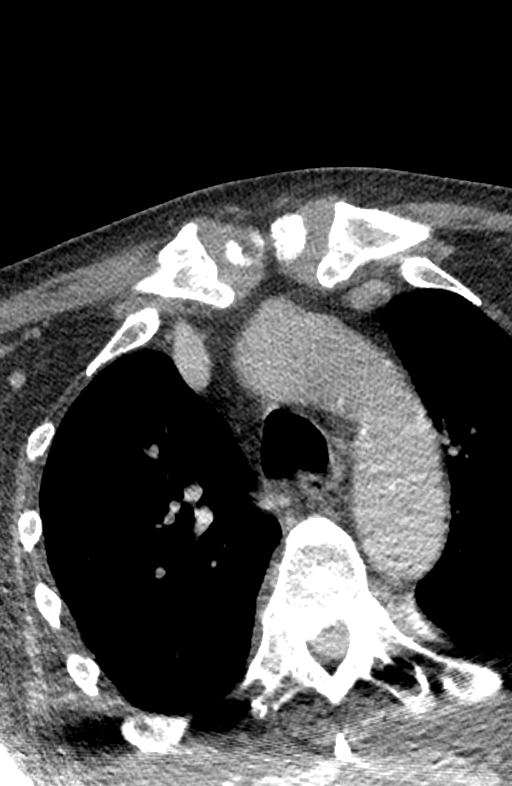
[im 19/114  bone]
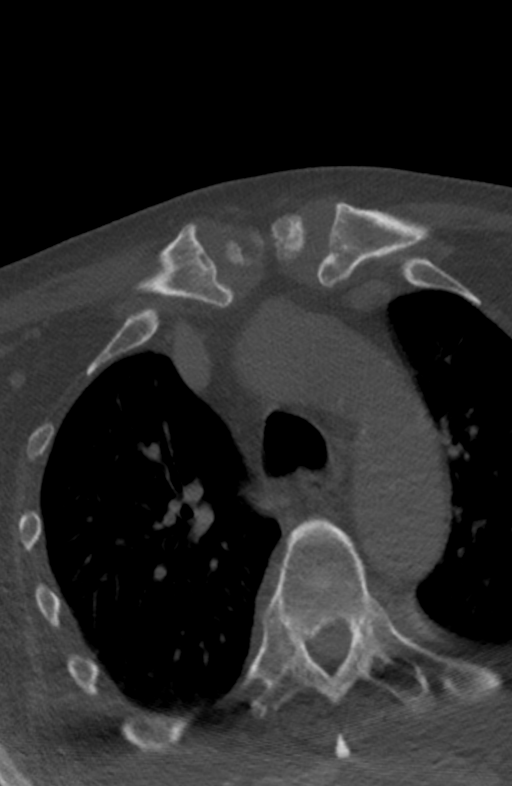
[im 57/114  bone]
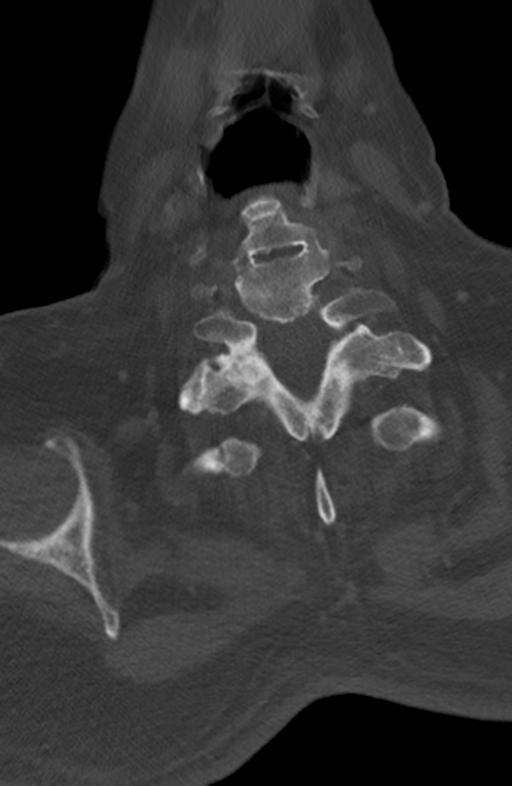
[im 95/114  bone]
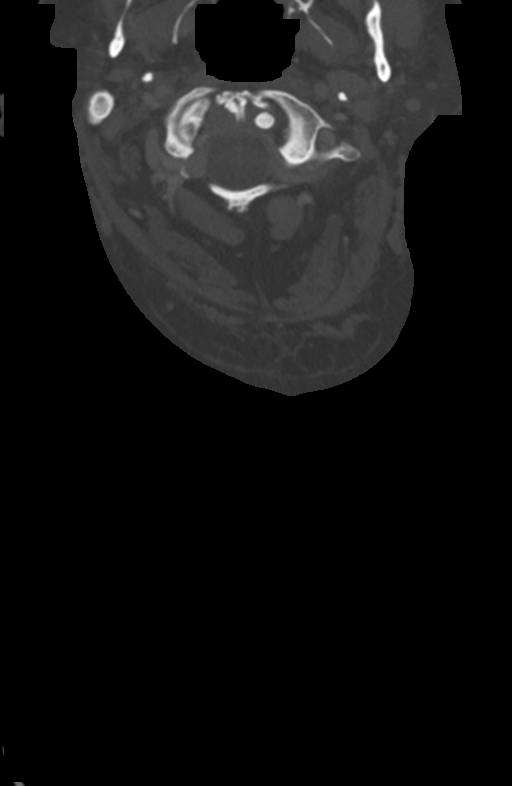

[11 of 35 positions shown; findings below may reference images not displayed]

FINDINGS: Pharynx and larynx: Focal mucosal or submucosal lesions are present.
The nasopharynx clear. Soft palate and tongue base are within normal
limits. Palatine tonsils are within normal limits. Vallecula and
epiglottis are within normal limits. Aryepiglottic folds and
piriform sinuses are clear. Vocal cords are midline and symmetric.
Trachea is clear.

Salivary glands: 2 distinct lesions are present in the posterior
right parotid gland. More superior lesion measures 10 x 10 x 11 mm.
More inferior and medial the lesion measures 14 x 10 by 17 mm.

In the deep lobe of the left parotid gland is a similar
homogeneously enhancing lesion measuring 2.2 x 1.1 x 1.7 cm. Medial
and superior is a more peripherally enhancing and slightly less
distinct lesion measuring 12 x 8 x 7 mm.

Submandibular glands and ducts are within normal limits.

Thyroid: Negative.

Lymph nodes: No significant cervical adenopathy is present.

Vascular: Atherosclerotic calcifications are present at the carotid
bifurcations bilaterally without significant stenosis.

Limited intracranial: Normal limits.

Visualized orbits: Bilateral lens replacements are noted. A remote
inferior orbital blowout fracture is present left. Globes and orbits
are otherwise unremarkable.

Mastoids and visualized paranasal sinuses: Left maxillary sinuses
chronically shrunken. No active disease is present. The paranasal
sinuses and mastoid air cells are otherwise clear.

Skeleton: Neck listhesis of the cervical and thoracic spine noted.
No focal osseous lesions are present. Extensive ossification of
posterior longitudinal ligament results in central canal stenosis is
greatest at C4-5 and C5-6. The patient is edentulous.

Upper chest: The lung apices are clear. Thoracic inlet is within
normal limits. Atherosclerotic calcifications are noted at the
aortic arch.
IMPRESSION: 1. Three homogeneously enhancing lesions within the posterior right
parotid gland and deep lobe of the left parotid gland are most
concerning for Warthin's tumors. Lesions appear benign.
2. More aggressive lesion in the left parotid gland measures 12 mm
maximally. This may represent additional Warthin's tumor. Parotid
malignancy metastatic disease not excluded.
3. No significant cervical adenopathy.
4. Ankylosis and extensive ossification of posterior longitudinal
ligament results in central canal stenosis greatest at C4-5 and
C5-6.
5. Remote inferior left orbital blowout fracture.
6. Aortic Atherosclerosis ([id number]).

## 2023-10-20 IMAGING — US IR IMAGING GUIDED PORT INSERTION
1 series · 1 of 1 positions shown · non-contrast
Comparison: none

INDICATION: Lymphoma.

[Series 1: ir fluoro/shunt/fist · 1 of 1 slices shown]
[im 1/1]
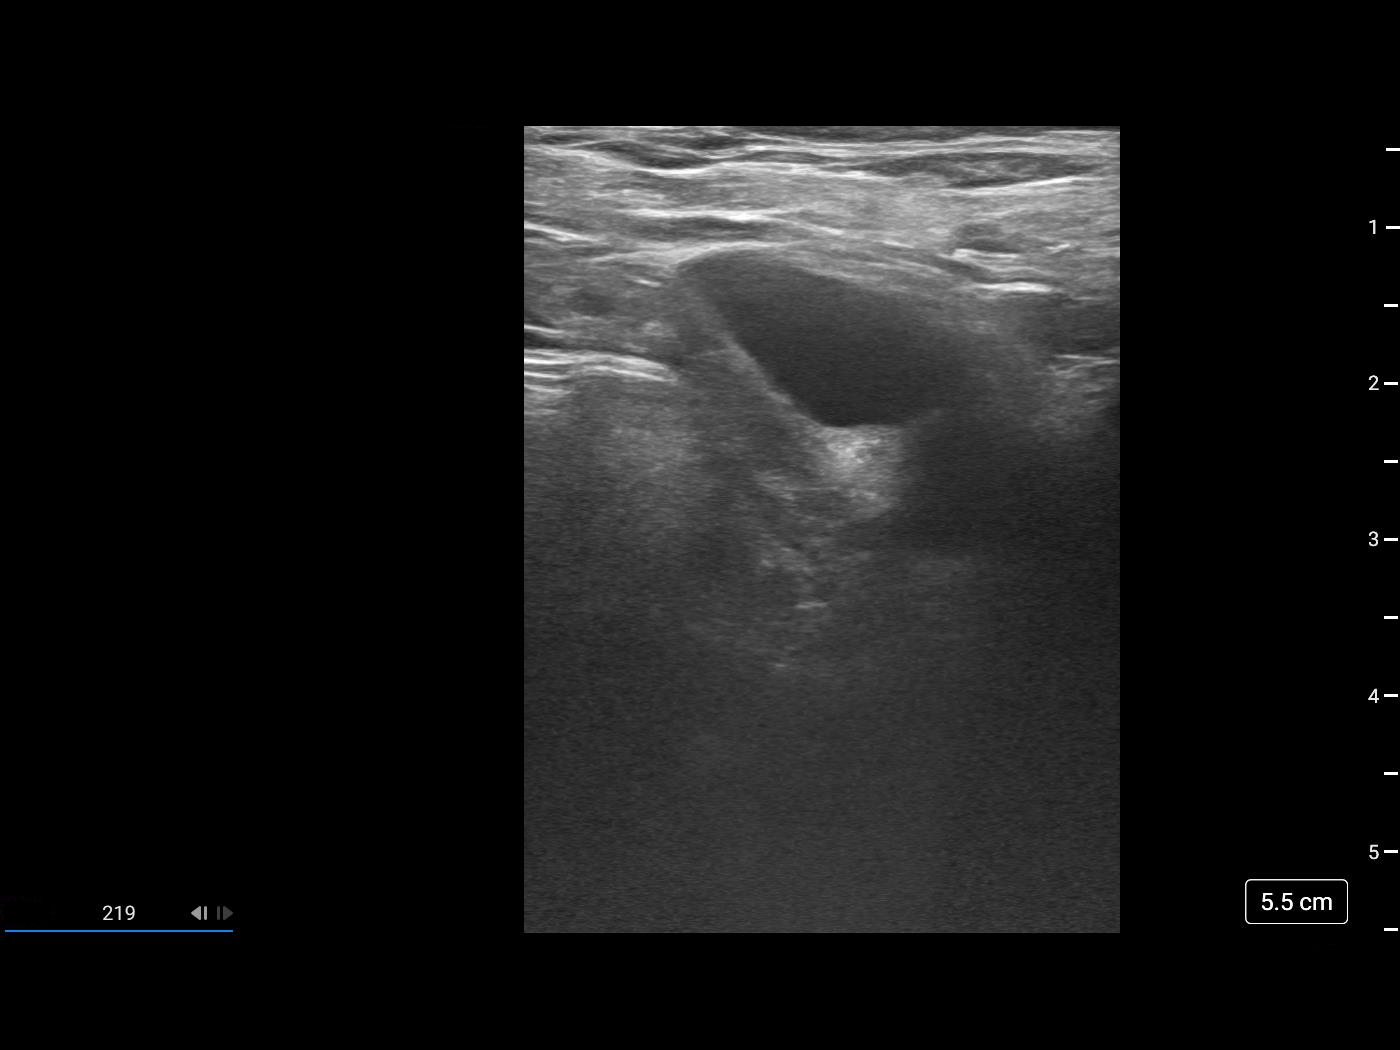

[1 of 1 positions shown; findings below may reference images not displayed]

EXAM:
IMPLANTED PORT A CATH PLACEMENT WITH ULTRASOUND AND FLUOROSCOPIC
GUIDANCE

MEDICATIONS:
None.

ANESTHESIA/SEDATION:
Moderate (conscious) sedation was employed during this procedure. A
total of Versed 1.5 mg and Fentanyl 75 mcg was administered
intravenously.

Moderate Sedation Time: 29 minutes. The patient's level of
consciousness and vital signs were monitored continuously by
radiology nursing throughout the procedure under my direct
supervision.

FLUOROSCOPY TIME:  0 minutes, 13 seconds (1 mGy)

COMPLICATIONS:
None immediate.

PROCEDURE:
The procedure, risks, benefits, and alternatives were explained to
the patient. Questions regarding the procedure were encouraged and
answered. The patient understands and consents to the procedure.

The neck and chest were prepped with chlorhexidine in a sterile
fashion, and a sterile drape was applied covering the operative
field. Maximum barrier sterile technique with sterile gowns and
gloves were used for the procedure. A timeout was performed prior to
the initiation of the procedure. Local anesthesia was provided with
1% lidocaine with epinephrine.

After creating a small venotomy incision, a micropuncture kit was
utilized to access the internal jugular vein under direct, real-time
ultrasound guidance. Ultrasound image documentation was performed.
The microwire was kinked to measure appropriate catheter length.

A subcutaneous port pocket was then created along the upper chest
wall utilizing a combination of sharp and blunt dissection. The
pocket was irrigated with sterile saline. A single lumen ISP power
injectable port was chosen for placement. The 8 Fr catheter was
tunneled from the port pocket site to the venotomy incision. The
port was placed in the pocket. The external catheter was trimmed to
appropriate length. At the venotomy, an 8 Fr peel-away sheath was
placed over a guidewire under fluoroscopic guidance. The catheter
was then placed through the sheath and the sheath was removed. Final
catheter positioning was confirmed and documented with a
fluoroscopic spot radiograph. The port was accessed with Une [place]
needle, aspirated and flushed with heparinized saline.

The port pocket incision was closed with interrupted 3-0 Vicryl
suture then Dermabond was applied, including at the venotomy
incision. Dressings were placed. The patient tolerated the procedure
well without immediate post procedural complication.
IMPRESSION: Successful placement of a right internal jugular approach power
injectable Port-A-Cath.

The catheter is ready for immediate use.

## 2023-11-07 NOTE — Progress Notes (Signed)
 Remote pacemaker transmission.

## 2023-11-07 NOTE — Addendum Note (Signed)
 Addended by: TAWNI DRILLING D on: 11/07/2023 05:06 PM   Modules accepted: Orders

## 2023-12-11 ENCOUNTER — Inpatient Hospital Stay: Attending: Hematology

## 2023-12-11 DIAGNOSIS — C8338 Diffuse large B-cell lymphoma, lymph nodes of multiple sites: Secondary | ICD-10-CM | POA: Insufficient documentation

## 2023-12-11 DIAGNOSIS — Z452 Encounter for adjustment and management of vascular access device: Secondary | ICD-10-CM | POA: Insufficient documentation

## 2023-12-11 MED ORDER — SODIUM CHLORIDE 0.9% FLUSH
10.0000 mL | Freq: Once | INTRAVENOUS | Status: AC
  Administered 2023-12-11: 10 mL via INTRAVENOUS

## 2023-12-11 NOTE — Progress Notes (Signed)
 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  Past/Anticipated interventions by patient's surgeon/dermatologist for current problematic lesion, if any:  Biopsy by shave method  Mohs Surgery  History of Blistering sunburns, if any:  Yes, Patient had a history of sunburns, works most of the time outside. patient used to work as a Music therapist and put up roofs SAFETY ISSUES: Prior radiation? None Pacemaker/ICD? Yes Possible current pregnancy? None Is the patient on methotrexate? None  Current Complaints / other details:   None BP (!) 138/97 (BP Location: Left Arm, Patient Position: Sitting)   Pulse 92   Temp (!) 96.6 F (35.9 C) (Temporal)   Resp 18   Ht 6' (1.829 m)   Wt 257 lb 2 oz (116.6 kg)   SpO2 96%   BMI 34.87 kg/m   Wt Readings from Last 3 Encounters:  12/17/23 257 lb 2 oz (116.6 kg)  10/15/23 251 lb 12.8 oz (114.2 kg)  09/11/23 255 lb 1.2 oz (115.7 kg)

## 2023-12-11 NOTE — Progress Notes (Signed)
 Patient's port flushed without difficulty.  Good blood return noted with no bruising or swelling noted at site.  No labs were ordered. Gauze dressing applied.  VSS with discharge and left in satisfactory condition with no s/s of distress noted. All follow ups as scheduled.       Roy [name]

## 2023-12-16 NOTE — Progress Notes (Addendum)
 Radiation Oncology         [phone] ________________________________  Initial outpatient Consultation  Name: Roy [name] MRN: [id number]  Date: 12/17/2023  DOB: Sep 10, 1950  RR:[id number], Roy [name], NP  Roy Drivers, MD   REFERRING PHYSICIAN: Robina Drivers, MD  DIAGNOSIS:    ICD-10-CM   1. 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  C44.329      Cancer Staging  Large B-cell lymphoma (HCC) Staging form: [name] and Non-[name] Lymphoma, AJCC 8th Edition - Clinical stage from 03/22/2021: Stage IV (Diffuse large B-cell lymphoma) - Signed by Roy Hai, MD on 03/22/2021  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 Staging form: Cutaneous Carcinoma of the Head and Neck, AJCC 8th Edition - Pathologic stage from 12/17/2023: Stage III (pT3, pN0, cM0) - Signed by Roy Domino, MD on 12/18/2023 Stage prefix: Initial diagnosis Extraosseous extension: Absent   Squamous cell carcinoma the skin of the left cheek; s/p MOHS with positive perineural invasion  CHIEF COMPLAINT: Here to discuss management of skin cancer  HISTORY OF PRESENT ILLNESS::Roy [name] is a 73 y.o. male who presented to his dermatologist on 11/05/23 complaining of an ulcer located in front of the left tragus measuring about 2 cm that had not been healing over the past few week.   A shave biopsy was performed at that time with surgical pathology indicating squamous cell carcinoma. Recommendation at that time was excision of lesion.   Subsequently, he underwent a Mohs procedure on 11/29/23 under the care of Roy [name]. Surgical pathology of left lateral malar cheek biopsy  indicated moderately differentiated invasive squamous cell carcinoma with perineural invasion, greatest diameter of the involved nerve is 0.3 mm. Roy [name] recommended undergoing radiation treatment to reduce risk of recurrence.   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       Patient reports to be doing well overall today. He denies any pain at the surgical incision. He has no follow-up plans with Roy [name] at this time.      ADDENDUM 12/20/2023: (pictures provided by Roy [name])     PREVIOUS RADIATION THERAPY: No  PAST MEDICAL HISTORY:  has a past medical history of Atrial fibrillation (HCC), Cancer (HCC), GERD (gastroesophageal reflux disease), High cholesterol, Hypertension, Obesity, and Pacemaker.    PAST SURGICAL HISTORY: Past Surgical History:  Procedure Laterality Date   APPENDECTOMY     BIOPSY  06/28/2023   Procedure: BIOPSY;  Surgeon: Roy Lamar HERO, MD;  [facility];  Service: Endoscopy;;   CATARACT EXTRACTION Bilateral 2022   CHOLECYSTECTOMY     ESOPHAGEAL DILATION     multiple times   ESOPHAGOGASTRODUODENOSCOPY (EGD) WITH PROPOFOL  N/A 06/28/2023   Procedure: ESOPHAGOGASTRODUODENOSCOPY (EGD) WITH PROPOFOL ;  Surgeon: Roy Lamar HERO, MD;  [facility];  Service: Endoscopy;  Laterality: N/A;   IR IMAGING GUIDED PORT INSERTION  03/31/2021   JOINT REPLACEMENT Left    hip   LUMBAR PERCUTANEOUS PEDICLE SCREW 3 LEVEL  05/27/2023   Procedure: POSTERIOR FIXATION AND FUSION OF THORACIC ELEVEN-THORACIC TWELVE FRACTURE DISLOCATION. FIXATION FROM THORACIC TEN-THORACIC ELEVEN METHACRYLATE AUGMENTATION WITH FLUOROSCOPIC GUIDANCE;  Surgeon: Roy Shove, MD;  Location: [facility] OR;  Service: Neurosurgery;;   Roy DILATION N/A 06/28/2023   Procedure: Roy [name];  Surgeon: Roy Lamar HERO, MD;  [facility];  Service: Endoscopy;  Laterality: N/A;  PACEMAKER IMPLANT N/A 09/07/2022   Procedure: PACEMAKER IMPLANT;  Surgeon: Roy Soyla Lunger, MD;  [facility];  Service: Cardiovascular;  Laterality: N/A;    FAMILY HISTORY: family history includes Heart attack in his father; Heart failure in his father; Thyroid  disease in his  sister.  SOCIAL HISTORY:  reports that he has never smoked. He has never been exposed to tobacco smoke. He has never used smokeless tobacco. He reports that he does not drink alcohol and does not use drugs.  ALLERGIES: Sulfa antibiotics and Adhesive [tape]  MEDICATIONS:  Current Outpatient Medications  Medication Sig Dispense Refill   acetaminophen  (TYLENOL ) 325 MG tablet Take 2 tablets (650 mg total) by mouth every 6 (six) hours as needed for mild pain (pain score 1-3), fever or headache (or Fever >/= 101).     calcium  carbonate (OS-CAL - DOSED IN MG OF ELEMENTAL CALCIUM ) 1250 (500 Ca) MG tablet Take 1 tablet (1,250 mg total) by mouth 2 (two) times daily with a meal.     ELIQUIS  2.5 MG TABS tablet Take 2.5 mg by mouth 2 (two) times daily.     ferrous sulfate  325 (65 FE) MG tablet Take 1 tablet (325 mg total) by mouth daily with breakfast. 30 tablet 0   furosemide  (LASIX ) 20 MG tablet Take 20 mg by mouth daily.     levothyroxine  (SYNTHROID ) 100 MCG tablet Take 1 tablet (100 mcg total) by mouth daily before breakfast. 30 tablet 0   magnesium  oxide (MAG-OX) 400 (240 Mg) MG tablet Take 1 tablet (400 mg total) by mouth daily. 30 tablet 0   midodrine  (PROAMATINE ) 10 MG tablet Take 1 tablet (10 mg total) by mouth 3 (three) times daily. 90 tablet 3   pantoprazole  (PROTONIX ) 40 MG tablet Take 1 tablet (40 mg total) by mouth daily. 30 tablet 5   polyethylene glycol (MIRALAX ) 17 g packet Take 17 g by mouth daily. 30 each 4   Potassium Chloride  ER 20 MEQ TBCR Take 1 tablet (20 mEq total) by mouth 2 (two) times daily. take while taking Lasix /furosemide  60 tablet 1   predniSONE  (DELTASONE ) 10 MG tablet Take 1 tablet (10 mg total) by mouth daily with breakfast. 30 tablet 0   rosuvastatin  (CRESTOR ) 5 MG tablet Take 1 tablet (5 mg total) by mouth at bedtime. 30 tablet 0   senna-docusate (SENOKOT-S) 8.6-50 MG tablet Take 2 tablets by mouth at bedtime. 60 tablet 1   traMADol  (ULTRAM ) 50 MG tablet Take 50 mg by  mouth every 6 (six) hours as needed.     vitamin B-12 (CYANOCOBALAMIN ) 1000 MCG tablet Take 1 tablet (1,000 mcg total) by mouth daily. 30 tablet 3   Vitamin D , Ergocalciferol , (DRISDOL ) 1.25 MG (50000 UNIT) CAPS capsule Take 1 capsule (50,000 Units total) by mouth every 7 (seven) days. 4 capsule 0   GNP ZINC OXIDE 20 % ointment Apply 1 Application topically every 8 (eight) hours as needed for irritation or diaper changes. (Patient not taking: Reported on 12/17/2023)     guaiFENesin  (MUCINEX ) 600 MG 12 hr tablet Take 1 tablet (600 mg total) by mouth 2 (two) times daily. (Patient not taking: Reported on 12/17/2023) 60 tablet 2   methocarbamol  (ROBAXIN ) 500 MG tablet Take 1 tablet (500 mg total) by mouth every 6 (six) hours as needed for muscle spasms. (Patient not taking: Reported on 12/17/2023) 20 tablet 0   valACYclovir  (VALTREX ) 1000 MG tablet Take 1 tablet (1,000 mg total) by mouth daily. (Patient not taking: Reported  on 12/17/2023) 30 tablet 0   No current facility-administered medications for this encounter.    REVIEW OF SYSTEMS:  Notable for that above.   PHYSICAL EXAM:  height is 6' (1.829 m) and weight is 257 lb 2 oz (116.6 kg). His temporal temperature is 96.6 F (35.9 C) (abnormal). His blood pressure is 138/97 (abnormal) and his pulse is 92. His respiration is 18 and oxygen saturation is 96%.   General: Alert and oriented, in no acute distress  HEENT: Head is normocephalic. Extraocular movements are intact. Oropharynx is clear. Burow's skin graft at the left cheek with surrounding erythema. Approximately 5 cm in greatest dimension (see below). Mild odor noted from the surgical site.  Neck: Neck is supple, no palpable cervical or supraclavicular lymphadenopathy. Heart: Regular in rate and rhythm with no murmurs, rubs, or gallops. Chest: Clear to auscultation bilaterally, with no rhonchi, wheezes, or rales. Abdomen: Soft, nontender, nondistended, with no rigidity or guarding. Extremities: No  cyanosis or edema. Lymphatics: see Neck Exam. No axillary lymphadenopathy.  Skin: No concerning lesions. Musculoskeletal: symmetric strength and muscle tone throughout. Neurologic: Cranial nerves II through XII are grossly intact. No obvious focalities. Speech is fluent. Coordination is intact. Psychiatric: Judgment and insight are intact. Affect is appropriate.     ECOG = 1  0 - Asymptomatic (Fully active, able to carry on all predisease activities without restriction)  1 - Symptomatic but completely ambulatory (Restricted in physically strenuous activity but ambulatory and able to carry out work of a light or sedentary nature. For example, light housework, office work)  2 - Symptomatic, <50% in bed during the day (Ambulatory and capable of all self care but unable to carry out any work activities. Up and about more than 50% of waking hours)  3 - Symptomatic, >50% in bed, but not bedbound (Capable of only limited self-care, confined to bed or chair 50% or more of waking hours)  4 - Bedbound (Completely disabled. Cannot carry on any self-care. Totally confined to bed or chair)  5 - Death   Raylene MM, Creech RH, Tormey DC, et al. [phone]). Toxicity and response criteria of the [facility]. Am. DOROTHA Bridges. Oncol. 5 (6): 649-55   LABORATORY DATA:  Lab Results  Component Value Date   WBC 15.0 (H) 09/11/2023   HGB 15.1 09/11/2023   HCT 47.7 09/11/2023   MCV 89.8 09/11/2023   PLT 254 09/11/2023   CMP     Component Value Date/Time   NA 141 10/10/2023 1131   K 4.0 10/10/2023 1131   CL 98 10/10/2023 1131   CO2 25 10/10/2023 1131   GLUCOSE 103 (H) 10/10/2023 1131   GLUCOSE 103 (H) 09/11/2023 0920   BUN 14 10/10/2023 1131   CREATININE 0.89 10/10/2023 1131   CREATININE 0.82 11/11/2020 1046   CALCIUM  10.3 (H) 10/10/2023 1131   PROT 6.7 10/10/2023 1131   ALBUMIN 4.3 10/10/2023 1131   AST 28 10/10/2023 1131   AST 74 (H) 11/11/2020 1046   ALT 15 10/10/2023 1131    ALT 21 11/11/2020 1046   ALKPHOS 165 (H) 10/10/2023 1131   BILITOT 0.8 10/10/2023 1131   BILITOT 2.3 (H) 11/11/2020 1046   EGFR 90 10/10/2023 1131   GFRNONAA >60 09/11/2023 0920   GFRNONAA >60 11/11/2020 1046        RADIOGRAPHY: No results found.    IMPRESSION/PLAN: Squamous cell carcinoma of the left cheek; s/p MOHS with significant positive perineural invasion on 11/29/2023   We reviewed this  patient's current work-up. He presents today after Moh's incision of the left cheek. Pathology confirms squamous cell carcinoma with positive perineural invasion identified (largest diameter of the involved nerve is 0.3 mm). Dr. Izell recommends adjuvant radiation to reduce the risk of recurrence   We discussed the natural history of squamous cell carcinoma involving the nerves and general treatment, highlighting the role of radiotherapy in the management with the patient today.  We discussed the available radiation techniques, and focused on the details of logistics and delivery.  We reviewed the anticipated acute and late sequelae associated with radiation in this setting.  The patient was encouraged to ask questions that I answered to the best of my ability. A patient consent form was discussed and signed.  We retained a copy for our records.  The patient would like to proceed with radiation and will be scheduled for CT simulation.   Simulation will be scheduled approximately 4 weeks after his surgery to allow for adequate healing. Dr. Izell anticipates a 4-week course of treatment directed at the surgical bed and surrounding margins. We look forward to participating in this patient's care.   Of note, a mild odor was noted at the surgical site today and based on smell/appearance there is some concern for necrosis/infection. Patient has no scheduled follow-up plans with Roy [name] to our knowledge. Dr. Izell will personally reach out to him to determine if he should be revaluated postoperatively for  these findings.  Patient also mentioned that Roy [name] informed him of a cancerous mole adjacent to the resection site, and would like this to be considered for radiation treatment. Dr. Izell will also discuss the details of this with Roy [name] to determine the role for radiotherapy and ensure optimal targeting of tissue.  On date of service, in total, we spent 60 minutes on this encounter. Patient was seen in person. Note signed after encounter date; minutes pertain to date of service, only.    __________________________________________    Leeroy Due, PA-C   Lauraine Izell, MD    [facility]  Radiation Oncology Direct Dial: [phone]  Fax: [phone] [email or web address]    This document serves as a record of services personally performed by Lauraine Izell, MD. It was created on her behalf by Reymundo Cartwright, a trained medical scribe. The creation of this record is based on the scribe's personal observations and the provider's statements to them. This document has been checked and approved by the attending provider.

## 2023-12-17 ENCOUNTER — Encounter: Payer: Self-pay | Admitting: Radiation Oncology

## 2023-12-17 ENCOUNTER — Ambulatory Visit
Admission: RE | Admit: 2023-12-17 | Discharge: 2023-12-17 | Disposition: A | Discharge status: Home / Self Care | Source: Ambulatory Visit | Attending: Radiation Oncology | Admitting: Radiation Oncology

## 2023-12-17 ENCOUNTER — Ambulatory Visit

## 2023-12-17 VITALS — BP 138/97 | HR 92 | Temp 96.6°F | Resp 18 | Ht 72.0 in | Wt 257.1 lb

## 2023-12-17 DIAGNOSIS — Z79624 Long term (current) use of inhibitors of nucleotide synthesis: Secondary | ICD-10-CM | POA: Diagnosis not present

## 2023-12-17 DIAGNOSIS — I4891 Unspecified atrial fibrillation: Secondary | ICD-10-CM | POA: Insufficient documentation

## 2023-12-17 DIAGNOSIS — E7800 Pure hypercholesterolemia, unspecified: Secondary | ICD-10-CM | POA: Insufficient documentation

## 2023-12-17 DIAGNOSIS — Z79890 Hormone replacement therapy (postmenopausal): Secondary | ICD-10-CM | POA: Diagnosis not present

## 2023-12-17 DIAGNOSIS — Z7952 Long term (current) use of systemic steroids: Secondary | ICD-10-CM | POA: Diagnosis not present

## 2023-12-17 DIAGNOSIS — C44329 Squamous cell carcinoma of skin of other parts of face: Secondary | ICD-10-CM | POA: Diagnosis present

## 2023-12-17 DIAGNOSIS — Z79899 Other long term (current) drug therapy: Secondary | ICD-10-CM | POA: Insufficient documentation

## 2023-12-17 DIAGNOSIS — Z950 Presence of cardiac pacemaker: Secondary | ICD-10-CM | POA: Diagnosis not present

## 2023-12-17 DIAGNOSIS — E669 Obesity, unspecified: Secondary | ICD-10-CM | POA: Diagnosis not present

## 2023-12-17 DIAGNOSIS — Z7901 Long term (current) use of anticoagulants: Secondary | ICD-10-CM | POA: Insufficient documentation

## 2023-12-17 DIAGNOSIS — I10 Essential (primary) hypertension: Secondary | ICD-10-CM | POA: Insufficient documentation

## 2023-12-17 NOTE — Progress Notes (Signed)
 Oncology Nurse Navigator Documentation   Met with patient during before his initial consult with Izell. I introduced myself as his Navigator, explained my role as a member of the Care Team. He verbalized understanding of information provided. I encouraged him to call with questions/concerns moving forward.  Roy Jefferson, RN, BSN, OCN Head & Neck Oncology Nurse Navigator [facility] at [place] [phone]

## 2023-12-20 NOTE — Addendum Note (Signed)
 Encounter addended by: Wyatt Leeroy HERO, PA-C on: 12/20/2023 10:10 AM  Actions taken: Clinical Note Signed

## 2023-12-23 ENCOUNTER — Ambulatory Visit (INDEPENDENT_AMBULATORY_CARE_PROVIDER_SITE_OTHER): Payer: 59

## 2023-12-23 DIAGNOSIS — I442 Atrioventricular block, complete: Secondary | ICD-10-CM

## 2023-12-25 LAB — CUP PACEART REMOTE DEVICE CHECK
Battery Remaining Longevity: 133 mo
Battery Voltage: 3.02 V
Brady Statistic RA Percent Paced: 0 %
Brady Statistic RV Percent Paced: 97.8 %
Date Time Interrogation Session: 20250817220614
Implantable Lead Connection Status: 753985
Implantable Lead Connection Status: 753985
Implantable Lead Implant Date: 20240503
Implantable Lead Implant Date: 20240503
Implantable Lead Location: 753859
Implantable Lead Location: 753860
Implantable Lead Model: 3830
Implantable Lead Model: 5076
Implantable Pulse Generator Implant Date: 20240503
Lead Channel Impedance Value: 380 Ohm
Lead Channel Impedance Value: 399 Ohm
Lead Channel Impedance Value: 570 Ohm
Lead Channel Impedance Value: 684 Ohm
Lead Channel Pacing Threshold Amplitude: 0.875 V
Lead Channel Pacing Threshold Pulse Width: 0.4 ms
Lead Channel Sensing Intrinsic Amplitude: 1.75 mV
Lead Channel Sensing Intrinsic Amplitude: 1.75 mV
Lead Channel Sensing Intrinsic Amplitude: 11 mV
Lead Channel Sensing Intrinsic Amplitude: 11 mV
Lead Channel Setting Pacing Amplitude: 2.5 V
Lead Channel Setting Pacing Amplitude: 3.5 V
Lead Channel Setting Pacing Pulse Width: 0.4 ms
Lead Channel Setting Sensing Sensitivity: 2.8 mV
Zone Setting Status: 755011
Zone Setting Status: 755011

## 2023-12-26 ENCOUNTER — Ambulatory Visit: Payer: Self-pay | Admitting: Cardiology

## 2024-01-02 ENCOUNTER — Encounter: Payer: Self-pay | Admitting: Pulmonary Disease

## 2024-01-02 ENCOUNTER — Ambulatory Visit: Attending: Pulmonary Disease | Admitting: Pulmonary Disease

## 2024-01-02 VITALS — BP 134/86 | HR 65 | Ht 72.0 in | Wt 258.0 lb

## 2024-01-02 DIAGNOSIS — I4819 Other persistent atrial fibrillation: Secondary | ICD-10-CM | POA: Diagnosis not present

## 2024-01-02 DIAGNOSIS — D6869 Other thrombophilia: Secondary | ICD-10-CM

## 2024-01-02 DIAGNOSIS — I10 Essential (primary) hypertension: Secondary | ICD-10-CM

## 2024-01-02 DIAGNOSIS — I5032 Chronic diastolic (congestive) heart failure: Secondary | ICD-10-CM

## 2024-01-02 DIAGNOSIS — I442 Atrioventricular block, complete: Secondary | ICD-10-CM

## 2024-01-02 DIAGNOSIS — Z950 Presence of cardiac pacemaker: Secondary | ICD-10-CM | POA: Diagnosis not present

## 2024-01-02 LAB — CUP PACEART INCLINIC DEVICE CHECK
Date Time Interrogation Session: 20250828164534
Implantable Lead Connection Status: 753985
Implantable Lead Connection Status: 753985
Implantable Lead Implant Date: 20240503
Implantable Lead Implant Date: 20240503
Implantable Lead Location: 753859
Implantable Lead Location: 753860
Implantable Lead Model: 3830
Implantable Lead Model: 5076
Implantable Pulse Generator Implant Date: 20240503

## 2024-01-02 NOTE — Patient Instructions (Signed)
 Medication Instructions:  Your physician recommends that you continue on your current medications as directed. Please refer to the Current Medication list given to you today.  *If you need a refill on your cardiac medications before your next appointment, please call your pharmacy*  Lab Work: None ordered If you have labs (blood work) drawn today and your tests are completely normal, you will receive your results only by: MyChart Message (if you have MyChart) OR A paper copy in the mail If you have any lab test that is abnormal or we need to change your treatment, we will call you to review the results.  Follow-Up: At [facility], you and your health needs are our priority.  As part of our continuing mission to provide you with exceptional heart care, our providers are all part of one team.  This team includes your primary Cardiologist (physician) and Advanced Practice Providers or APPs (Physician Assistants and Nurse Practitioners) who all work together to provide you with the care you need, when you need it.  Your next appointment:   End of October with Dr Waddell in [place] or here with Daphne

## 2024-01-02 NOTE — Progress Notes (Signed)
  Electrophysiology Office Note:   Date:  01/02/2024  ID:  Roy [name], DOB 1951-04-10, MRN [id number]  Primary Cardiologist: None Primary Heart Failure: None Electrophysiologist: Will Gladis Norton, MD       History of Present Illness:   Roy [name] is a 73 y.o. male with h/o AF, CHB s/p PPM, LAFB, HTN, HLD, aortic atherosclerosis, HFpEF, hypothyroidism, obesity, diffuse B-Cell Lymphoma, adrenal insufficiency (chronic prednisone ) seen today for routine electrophysiology followup.   Since last being seen in our clinic the patient reports doing ok overall. He has no device related concerns. Reports he is to have radiation to his face from 9/16 - 02/20/24. He asks if he can be seen in [place] as it is easier on him and his family.     He denies chest pain, palpitations, dyspnea, PND, orthopnea, nausea, vomiting, dizziness, syncope, edema, weight gain, or early satiety.   Review of systems complete and found to be negative unless listed in HPI.    EP Information / Studies Reviewed:    EKG is not ordered today. EKG from 08/09/23 reviewed which showed AF, VP 62 bpm       PPM Interrogation-  reviewed in detail today,  See PACEART report.  Device History: Medtronic Dual Chamber PPM implanted 09/07/22 for CHB  Risk Assessment/Calculations:    CHA2DS2-VASc Score = 4   This indicates a 4.8% annual risk of stroke. The patient's score is based upon: CHF History: 1 HTN History: 1 Diabetes History: 0 Stroke History: 0 Vascular Disease History: 1 Age Score: 1 Gender Score: 0             Physical Exam:   VS:  BP 134/86   Pulse 65   Ht 6' (1.829 m)   Wt 258 lb (117 kg)   SpO2 94%   BMI 34.99 kg/m    Wt Readings from Last 3 Encounters:  01/02/24 258 lb (117 kg)  12/17/23 257 lb 2 oz (116.6 kg)  10/15/23 251 lb 12.8 oz (114.2 kg)     GEN: Well nourished, well developed in no acute distress NECK: No JVD; No carotid bruits CARDIAC: Regular rate and rhythm, no murmurs,  rubs, gallops RESPIRATORY:  Clear to auscultation without rales, wheezing or rhonchi  ABDOMEN: Soft, non-tender, non-distended EXTREMITIES:  No edema; No deformity   ASSESSMENT AND PLAN:    CHB s/p Medtronic PPM  -Normal PPM function -See Pace Art report -No changes today -VVIR in setting of AF, V rates well controlled -plan to follow up at the end of October after radiation complete to recheck device.  He requests to be seen in [place] for ease of travel  Persistent Atrial Fibrillation  CHA2DS2-VASc 4 -OAC for stroke prophylaxis  -VVIR as above  Secondary Hypercoagulable State  -continue Eliquis  5mg  BID, dose reviewed and appropriate by age / wt  Hypertension  -well controlled on current regimen   HFpEF -euvolemic on exam    Disposition:   Follow up with Dr. Norton / EP APP at the end of Oct 2025 post radiation  . Ok for him to be seen in [place] for follow up if available.   Signed, Daphne Barrack, NP-C, AGACNP-BC  [facility] - Electrophysiology  01/02/2024, 3:23 PM

## 2024-01-03 ENCOUNTER — Encounter: Payer: Self-pay | Admitting: Cardiology

## 2024-01-03 NOTE — Progress Notes (Signed)
 TO BE COMPLETED BY RADIATION ONCOLOGIST OFFICE:   Patient Name: Roy [name]   Date of Birth: 1950-09-28   Radiation Oncologist:  Lauraine Golden, MD  Site to be Treated: Left Cheek  Will x-rays >10 MV be used? No  Will the radiation be >10 cm from the device? No  Planned Treatment Start Date: TBD  TO BE COMPLETED BY CARDIOLOGIST OFFICE:   Device Information:  Pacemaker [x]      ICD []    Brand: Medtronic: [phone] Model #: NELWYN HEWS DR MRI  [id number]  Serial Number: [id number] Date of Placement: 09/07/2022  Site of Placement: LEFT CHEST  Remote Device Check--Frequency: every 3 months - remote/ yearly in office Last Check: Remote 12/22/23, in office 01/02/24  Is the Patient Pacer Dependent?:  Yes [x]   No []   Does cardiologist request Radiation Oncology to schedule device testing by vendor for the following:  Prior to the Initiation of Treatments?  Yes []  No [x]  During Treatments?  Yes [x]  No []  Post Radiation Treatments?  Yes [x]  No []   Is device monitoring necessary by vendor/cardiologist team during treatments?  Yes [x]   No []   Is cardiac monitoring by Radiation Oncology nursing necessary during treatments? Yes [x]   No []   Do you recommend device be relocated prior to Radiation Treatment? Yes []   No [x]   **PLEASE LIST ANY NOTES OR SPECIAL REQUESTS: Patient will need in office follow up with EP APP or provider in office following treatment series. We will schedule.      CARDIOLOGIST SIGNATURE:  Dr. Soyla Norton Per Device Clinic Standing Orders, Alan JAYSON Fees  01/03/2024 1:30 PM  **Please route completed form back to Radiation Oncology Nursing and P [facility] RAD ONC ADMIN, OR send an update if there will be a delay in having form completed by expected start date.  **Call [phone] if you have any questions or do not get an in-basket response from a Radiation Oncology staff member

## 2024-01-03 NOTE — Telephone Encounter (Signed)
Erroneous enc

## 2024-01-07 ENCOUNTER — Telehealth: Payer: Self-pay | Admitting: "Endocrinology

## 2024-01-07 DIAGNOSIS — E2740 Unspecified adrenocortical insufficiency: Secondary | ICD-10-CM

## 2024-01-07 MED ORDER — PREDNISONE 10 MG PO TABS: 10.0000 mg | ORAL_TABLET | Freq: Every day | ORAL | 0 refills | Status: DC

## 2024-01-07 NOTE — Telephone Encounter (Signed)
 Pt states that Dr. Lenis is supposed to be responsible for his prednisone  refills.  Needs called into [facility].

## 2024-01-07 NOTE — Telephone Encounter (Signed)
 Rx refill for Prednisone  10mg  daily 90 day supply sent to [facility].

## 2024-01-08 ENCOUNTER — Ambulatory Visit: Admitting: Radiation Oncology

## 2024-01-08 ENCOUNTER — Ambulatory Visit: Admission: RE | Admit: 2024-01-08 | Source: Ambulatory Visit | Admitting: Radiation Oncology

## 2024-01-09 ENCOUNTER — Ambulatory Visit: Payer: Self-pay | Admitting: Cardiology

## 2024-01-17 ENCOUNTER — Ambulatory Visit
Admission: RE | Admit: 2024-01-17 | Discharge: 2024-01-17 | Disposition: A | Discharge status: Home / Self Care | Source: Ambulatory Visit | Attending: Radiation Oncology | Admitting: Radiation Oncology

## 2024-01-17 DIAGNOSIS — Z510 Encounter for antineoplastic radiation therapy: Secondary | ICD-10-CM | POA: Diagnosis present

## 2024-01-17 DIAGNOSIS — C44329 Squamous cell carcinoma of skin of other parts of face: Secondary | ICD-10-CM | POA: Diagnosis present

## 2024-01-21 ENCOUNTER — Ambulatory Visit

## 2024-01-21 ENCOUNTER — Ambulatory Visit: Admitting: Radiation Oncology

## 2024-01-21 DIAGNOSIS — C44329 Squamous cell carcinoma of skin of other parts of face: Secondary | ICD-10-CM | POA: Diagnosis not present

## 2024-01-22 ENCOUNTER — Ambulatory Visit
Admission: RE | Admit: 2024-01-22 | Discharge: 2024-01-22 | Disposition: A | Discharge status: Home / Self Care | Source: Ambulatory Visit | Attending: Radiation Oncology | Admitting: Radiation Oncology

## 2024-01-22 ENCOUNTER — Other Ambulatory Visit: Payer: Self-pay

## 2024-01-22 ENCOUNTER — Ambulatory Visit

## 2024-01-22 DIAGNOSIS — C44329 Squamous cell carcinoma of skin of other parts of face: Secondary | ICD-10-CM | POA: Diagnosis not present

## 2024-01-22 LAB — RAD ONC ARIA SESSION SUMMARY
Course Elapsed Days: 0
Plan Fractions Treated to Date: 1
Plan Prescribed Dose Per Fraction: 2.5 Gy
Plan Total Fractions Prescribed: 20
Plan Total Prescribed Dose: 50 Gy
Reference Point Dosage Given to Date: 2.5 Gy
Reference Point Session Dosage Given: 2.5 Gy
Session Number: 1

## 2024-01-23 ENCOUNTER — Other Ambulatory Visit: Payer: Self-pay

## 2024-01-23 ENCOUNTER — Ambulatory Visit
Admission: RE | Admit: 2024-01-23 | Discharge: 2024-01-23 | Disposition: A | Discharge status: Home / Self Care | Source: Ambulatory Visit | Attending: Radiation Oncology

## 2024-01-23 DIAGNOSIS — C44329 Squamous cell carcinoma of skin of other parts of face: Secondary | ICD-10-CM | POA: Diagnosis not present

## 2024-01-23 LAB — RAD ONC ARIA SESSION SUMMARY
Course Elapsed Days: 1
Plan Fractions Treated to Date: 2
Plan Prescribed Dose Per Fraction: 2.5 Gy
Plan Total Fractions Prescribed: 20
Plan Total Prescribed Dose: 50 Gy
Reference Point Dosage Given to Date: 5 Gy
Reference Point Session Dosage Given: 2.5 Gy
Session Number: 2

## 2024-01-24 ENCOUNTER — Ambulatory Visit
Admission: RE | Admit: 2024-01-24 | Discharge: 2024-01-24 | Disposition: A | Discharge status: Home / Self Care | Source: Ambulatory Visit | Attending: Radiation Oncology | Admitting: Radiation Oncology

## 2024-01-24 ENCOUNTER — Other Ambulatory Visit: Payer: Self-pay

## 2024-01-24 DIAGNOSIS — C44329 Squamous cell carcinoma of skin of other parts of face: Secondary | ICD-10-CM | POA: Diagnosis not present

## 2024-01-24 LAB — RAD ONC ARIA SESSION SUMMARY
Course Elapsed Days: 2
Plan Fractions Treated to Date: 3
Plan Prescribed Dose Per Fraction: 2.5 Gy
Plan Total Fractions Prescribed: 20
Plan Total Prescribed Dose: 50 Gy
Reference Point Dosage Given to Date: 7.5 Gy
Reference Point Session Dosage Given: 2.5 Gy
Session Number: 3

## 2024-01-27 ENCOUNTER — Ambulatory Visit
Admission: RE | Admit: 2024-01-27 | Discharge: 2024-01-27 | Disposition: A | Discharge status: Home / Self Care | Source: Ambulatory Visit | Attending: Radiation Oncology

## 2024-01-27 ENCOUNTER — Ambulatory Visit
Admission: RE | Admit: 2024-01-27 | Discharge: 2024-01-27 | Disposition: A | Discharge status: Home / Self Care | Source: Ambulatory Visit | Attending: Radiation Oncology | Admitting: Radiation Oncology

## 2024-01-27 ENCOUNTER — Other Ambulatory Visit: Payer: Self-pay

## 2024-01-27 DIAGNOSIS — C44329 Squamous cell carcinoma of skin of other parts of face: Secondary | ICD-10-CM | POA: Diagnosis not present

## 2024-01-27 LAB — RAD ONC ARIA SESSION SUMMARY
Course Elapsed Days: 5
Plan Fractions Treated to Date: 4
Plan Prescribed Dose Per Fraction: 2.5 Gy
Plan Total Fractions Prescribed: 20
Plan Total Prescribed Dose: 50 Gy
Reference Point Dosage Given to Date: 10 Gy
Reference Point Session Dosage Given: 2.5 Gy
Session Number: 4

## 2024-01-28 ENCOUNTER — Other Ambulatory Visit: Payer: Self-pay

## 2024-01-28 ENCOUNTER — Ambulatory Visit
Admission: RE | Admit: 2024-01-28 | Discharge: 2024-01-28 | Disposition: A | Discharge status: Home / Self Care | Source: Ambulatory Visit | Attending: Radiation Oncology

## 2024-01-28 DIAGNOSIS — C44329 Squamous cell carcinoma of skin of other parts of face: Secondary | ICD-10-CM | POA: Diagnosis not present

## 2024-01-28 LAB — RAD ONC ARIA SESSION SUMMARY
Course Elapsed Days: 6
Plan Fractions Treated to Date: 5
Plan Prescribed Dose Per Fraction: 2.5 Gy
Plan Total Fractions Prescribed: 20
Plan Total Prescribed Dose: 50 Gy
Reference Point Dosage Given to Date: 12.5 Gy
Reference Point Session Dosage Given: 2.5 Gy
Session Number: 5

## 2024-01-29 ENCOUNTER — Ambulatory Visit
Admission: RE | Admit: 2024-01-29 | Discharge: 2024-01-29 | Disposition: A | Discharge status: Home / Self Care | Source: Ambulatory Visit | Attending: Radiation Oncology | Admitting: Radiation Oncology

## 2024-01-29 ENCOUNTER — Other Ambulatory Visit: Payer: Self-pay

## 2024-01-29 DIAGNOSIS — C44329 Squamous cell carcinoma of skin of other parts of face: Secondary | ICD-10-CM | POA: Diagnosis not present

## 2024-01-29 LAB — RAD ONC ARIA SESSION SUMMARY
Course Elapsed Days: 7
Plan Fractions Treated to Date: 6
Plan Prescribed Dose Per Fraction: 2.5 Gy
Plan Total Fractions Prescribed: 20
Plan Total Prescribed Dose: 50 Gy
Reference Point Dosage Given to Date: 15 Gy
Reference Point Session Dosage Given: 2.5 Gy
Session Number: 6

## 2024-01-29 NOTE — Progress Notes (Signed)
 Remote PPM Transmission

## 2024-01-30 ENCOUNTER — Ambulatory Visit

## 2024-01-31 ENCOUNTER — Ambulatory Visit

## 2024-02-03 ENCOUNTER — Ambulatory Visit
Admission: RE | Admit: 2024-02-03 | Discharge: 2024-02-03 | Disposition: A | Discharge status: Home / Self Care | Source: Ambulatory Visit | Attending: Radiation Oncology | Admitting: Radiation Oncology

## 2024-02-03 ENCOUNTER — Ambulatory Visit
Admission: RE | Admit: 2024-02-03 | Discharge: 2024-02-03 | Disposition: A | Source: Ambulatory Visit | Attending: Radiation Oncology

## 2024-02-03 ENCOUNTER — Other Ambulatory Visit: Payer: Self-pay

## 2024-02-03 DIAGNOSIS — C44329 Squamous cell carcinoma of skin of other parts of face: Secondary | ICD-10-CM | POA: Diagnosis not present

## 2024-02-03 LAB — RAD ONC ARIA SESSION SUMMARY
Course Elapsed Days: 12
Plan Fractions Treated to Date: 7
Plan Prescribed Dose Per Fraction: 2.5 Gy
Plan Total Fractions Prescribed: 20
Plan Total Prescribed Dose: 50 Gy
Reference Point Dosage Given to Date: 17.5 Gy
Reference Point Session Dosage Given: 2.5 Gy
Session Number: 7

## 2024-02-04 ENCOUNTER — Other Ambulatory Visit: Payer: Self-pay

## 2024-02-04 ENCOUNTER — Ambulatory Visit
Admission: RE | Admit: 2024-02-04 | Discharge: 2024-02-04 | Disposition: A | Source: Ambulatory Visit | Attending: Radiation Oncology

## 2024-02-04 DIAGNOSIS — C44329 Squamous cell carcinoma of skin of other parts of face: Secondary | ICD-10-CM | POA: Diagnosis not present

## 2024-02-04 LAB — RAD ONC ARIA SESSION SUMMARY
Course Elapsed Days: 13
Plan Fractions Treated to Date: 8
Plan Prescribed Dose Per Fraction: 2.5 Gy
Plan Total Fractions Prescribed: 20
Plan Total Prescribed Dose: 50 Gy
Reference Point Dosage Given to Date: 20 Gy
Reference Point Session Dosage Given: 2.5 Gy
Session Number: 8

## 2024-02-05 ENCOUNTER — Ambulatory Visit
Admission: RE | Admit: 2024-02-05 | Discharge: 2024-02-05 | Disposition: A | Discharge status: Home / Self Care | Source: Ambulatory Visit | Attending: Radiation Oncology | Admitting: Radiation Oncology

## 2024-02-05 ENCOUNTER — Other Ambulatory Visit: Payer: Self-pay

## 2024-02-05 DIAGNOSIS — C44329 Squamous cell carcinoma of skin of other parts of face: Secondary | ICD-10-CM | POA: Insufficient documentation

## 2024-02-05 DIAGNOSIS — Z510 Encounter for antineoplastic radiation therapy: Secondary | ICD-10-CM | POA: Insufficient documentation

## 2024-02-05 LAB — RAD ONC ARIA SESSION SUMMARY
Course Elapsed Days: 14
Plan Fractions Treated to Date: 9
Plan Prescribed Dose Per Fraction: 2.5 Gy
Plan Total Fractions Prescribed: 20
Plan Total Prescribed Dose: 50 Gy
Reference Point Dosage Given to Date: 22.5 Gy
Reference Point Session Dosage Given: 2.5 Gy
Session Number: 9

## 2024-02-06 ENCOUNTER — Ambulatory Visit
Admission: RE | Admit: 2024-02-06 | Discharge: 2024-02-06 | Disposition: A | Discharge status: Home / Self Care | Source: Ambulatory Visit | Attending: Radiation Oncology | Admitting: Radiation Oncology

## 2024-02-06 ENCOUNTER — Other Ambulatory Visit: Payer: Self-pay

## 2024-02-06 DIAGNOSIS — Z510 Encounter for antineoplastic radiation therapy: Secondary | ICD-10-CM | POA: Diagnosis not present

## 2024-02-06 LAB — RAD ONC ARIA SESSION SUMMARY
Course Elapsed Days: 15
Plan Fractions Treated to Date: 10
Plan Prescribed Dose Per Fraction: 2.5 Gy
Plan Total Fractions Prescribed: 20
Plan Total Prescribed Dose: 50 Gy
Reference Point Dosage Given to Date: 25 Gy
Reference Point Session Dosage Given: 2.5 Gy
Session Number: 10

## 2024-02-07 ENCOUNTER — Ambulatory Visit
Admission: RE | Admit: 2024-02-07 | Discharge: 2024-02-07 | Disposition: A | Discharge status: Home / Self Care | Source: Ambulatory Visit | Attending: Radiation Oncology | Admitting: Radiation Oncology

## 2024-02-07 ENCOUNTER — Other Ambulatory Visit: Payer: Self-pay

## 2024-02-07 DIAGNOSIS — Z510 Encounter for antineoplastic radiation therapy: Secondary | ICD-10-CM | POA: Diagnosis not present

## 2024-02-07 LAB — RAD ONC ARIA SESSION SUMMARY
Course Elapsed Days: 16
Plan Fractions Treated to Date: 11
Plan Prescribed Dose Per Fraction: 2.5 Gy
Plan Total Fractions Prescribed: 20
Plan Total Prescribed Dose: 50 Gy
Reference Point Dosage Given to Date: 27.5 Gy
Reference Point Session Dosage Given: 2.5 Gy
Session Number: 11

## 2024-02-10 ENCOUNTER — Ambulatory Visit
Admission: RE | Admit: 2024-02-10 | Discharge: 2024-02-10 | Disposition: A | Discharge status: Home / Self Care | Source: Ambulatory Visit | Attending: Radiation Oncology | Admitting: Radiation Oncology

## 2024-02-10 ENCOUNTER — Ambulatory Visit
Admission: RE | Admit: 2024-02-10 | Discharge: 2024-02-10 | Disposition: A | Source: Ambulatory Visit | Attending: Radiation Oncology

## 2024-02-10 ENCOUNTER — Other Ambulatory Visit: Payer: Self-pay

## 2024-02-10 DIAGNOSIS — Z510 Encounter for antineoplastic radiation therapy: Secondary | ICD-10-CM | POA: Diagnosis not present

## 2024-02-10 LAB — RAD ONC ARIA SESSION SUMMARY
Course Elapsed Days: 19
Plan Fractions Treated to Date: 12
Plan Prescribed Dose Per Fraction: 2.5 Gy
Plan Total Fractions Prescribed: 20
Plan Total Prescribed Dose: 50 Gy
Reference Point Dosage Given to Date: 30 Gy
Reference Point Session Dosage Given: 2.5 Gy
Session Number: 12

## 2024-02-11 ENCOUNTER — Ambulatory Visit
Admission: RE | Admit: 2024-02-11 | Discharge: 2024-02-11 | Disposition: A | Discharge status: Home / Self Care | Source: Ambulatory Visit | Attending: Radiation Oncology | Admitting: Radiation Oncology

## 2024-02-11 ENCOUNTER — Other Ambulatory Visit: Payer: Self-pay

## 2024-02-11 DIAGNOSIS — Z510 Encounter for antineoplastic radiation therapy: Secondary | ICD-10-CM | POA: Diagnosis not present

## 2024-02-11 LAB — RAD ONC ARIA SESSION SUMMARY
Course Elapsed Days: 20
Plan Fractions Treated to Date: 13
Plan Prescribed Dose Per Fraction: 2.5 Gy
Plan Total Fractions Prescribed: 20
Plan Total Prescribed Dose: 50 Gy
Reference Point Dosage Given to Date: 32.5 Gy
Reference Point Session Dosage Given: 2.5 Gy
Session Number: 13

## 2024-02-12 ENCOUNTER — Ambulatory Visit
Admission: RE | Admit: 2024-02-12 | Discharge: 2024-02-12 | Disposition: A | Discharge status: Home / Self Care | Source: Ambulatory Visit | Attending: Radiation Oncology | Admitting: Radiation Oncology

## 2024-02-12 ENCOUNTER — Other Ambulatory Visit: Payer: Self-pay

## 2024-02-12 DIAGNOSIS — Z510 Encounter for antineoplastic radiation therapy: Secondary | ICD-10-CM | POA: Diagnosis not present

## 2024-02-12 LAB — RAD ONC ARIA SESSION SUMMARY
Course Elapsed Days: 21
Plan Fractions Treated to Date: 14
Plan Prescribed Dose Per Fraction: 2.5 Gy
Plan Total Fractions Prescribed: 20
Plan Total Prescribed Dose: 50 Gy
Reference Point Dosage Given to Date: 35 Gy
Reference Point Session Dosage Given: 2.5 Gy
Session Number: 14

## 2024-02-13 ENCOUNTER — Ambulatory Visit
Admission: RE | Admit: 2024-02-13 | Discharge: 2024-02-13 | Disposition: A | Discharge status: Home / Self Care | Source: Ambulatory Visit | Attending: Radiation Oncology | Admitting: Radiation Oncology

## 2024-02-13 ENCOUNTER — Other Ambulatory Visit: Payer: Self-pay

## 2024-02-13 DIAGNOSIS — Z510 Encounter for antineoplastic radiation therapy: Secondary | ICD-10-CM | POA: Diagnosis not present

## 2024-02-13 LAB — RAD ONC ARIA SESSION SUMMARY
Course Elapsed Days: 22
Plan Fractions Treated to Date: 15
Plan Prescribed Dose Per Fraction: 2.5 Gy
Plan Total Fractions Prescribed: 20
Plan Total Prescribed Dose: 50 Gy
Reference Point Dosage Given to Date: 37.5 Gy
Reference Point Session Dosage Given: 2.5 Gy
Session Number: 15

## 2024-02-14 ENCOUNTER — Other Ambulatory Visit: Payer: Self-pay

## 2024-02-14 ENCOUNTER — Ambulatory Visit
Admission: RE | Admit: 2024-02-14 | Discharge: 2024-02-14 | Disposition: A | Discharge status: Home / Self Care | Source: Ambulatory Visit | Attending: Radiation Oncology | Admitting: Radiation Oncology

## 2024-02-14 DIAGNOSIS — Z510 Encounter for antineoplastic radiation therapy: Secondary | ICD-10-CM | POA: Diagnosis not present

## 2024-02-14 LAB — RAD ONC ARIA SESSION SUMMARY
Course Elapsed Days: 23
Plan Fractions Treated to Date: 16
Plan Prescribed Dose Per Fraction: 2.5 Gy
Plan Total Fractions Prescribed: 20
Plan Total Prescribed Dose: 50 Gy
Reference Point Dosage Given to Date: 40 Gy
Reference Point Session Dosage Given: 2.5 Gy
Session Number: 16

## 2024-02-17 ENCOUNTER — Ambulatory Visit

## 2024-02-17 ENCOUNTER — Ambulatory Visit
Admission: RE | Admit: 2024-02-17 | Discharge: 2024-02-17 | Disposition: A | Discharge status: Home / Self Care | Source: Ambulatory Visit | Attending: Radiation Oncology | Admitting: Radiation Oncology

## 2024-02-17 ENCOUNTER — Ambulatory Visit
Admission: RE | Admit: 2024-02-17 | Discharge: 2024-02-17 | Disposition: A | Source: Ambulatory Visit | Attending: Radiation Oncology

## 2024-02-17 ENCOUNTER — Other Ambulatory Visit: Payer: Self-pay

## 2024-02-17 DIAGNOSIS — Z510 Encounter for antineoplastic radiation therapy: Secondary | ICD-10-CM | POA: Diagnosis not present

## 2024-02-17 LAB — RAD ONC ARIA SESSION SUMMARY
Course Elapsed Days: 26
Plan Fractions Treated to Date: 17
Plan Prescribed Dose Per Fraction: 2.5 Gy
Plan Total Fractions Prescribed: 20
Plan Total Prescribed Dose: 50 Gy
Reference Point Dosage Given to Date: 42.5 Gy
Reference Point Session Dosage Given: 2.5 Gy
Session Number: 17

## 2024-02-18 ENCOUNTER — Ambulatory Visit

## 2024-02-19 ENCOUNTER — Ambulatory Visit

## 2024-02-20 ENCOUNTER — Ambulatory Visit

## 2024-02-21 ENCOUNTER — Ambulatory Visit

## 2024-02-24 ENCOUNTER — Ambulatory Visit

## 2024-02-24 NOTE — Progress Notes (Signed)
 Oncology Nurse Navigator Documentation   I called Mr. Tift at the request of Dr. Izell. He has missed multiple radiation appointments due to an ear infection on the same side of his face that he is receiving radiation treatments for a skin cancer to his left cheek. Dr. Izell would like to see him tomorrow to evaluate the area and determine if it's safe to resume radiation. Mr. Drumwright has agreed to come tomorrow at 11:00 to see Dr. Izell in clinic.  Delon Jefferson RN, BSN, OCN Head & Neck Oncology Nurse Navigator [facility] Phone # [phone]  Fax # [phone]

## 2024-02-25 ENCOUNTER — Other Ambulatory Visit: Payer: Self-pay

## 2024-02-25 ENCOUNTER — Ambulatory Visit
Admission: RE | Admit: 2024-02-25 | Discharge: 2024-02-25 | Disposition: A | Discharge status: Home / Self Care | Source: Ambulatory Visit | Attending: Radiation Oncology | Admitting: Radiation Oncology

## 2024-02-25 DIAGNOSIS — Z510 Encounter for antineoplastic radiation therapy: Secondary | ICD-10-CM | POA: Diagnosis not present

## 2024-02-25 LAB — RAD ONC ARIA SESSION SUMMARY
Course Elapsed Days: 34
Plan Fractions Treated to Date: 18
Plan Prescribed Dose Per Fraction: 2.5 Gy
Plan Total Fractions Prescribed: 20
Plan Total Prescribed Dose: 50 Gy
Reference Point Dosage Given to Date: 45 Gy
Reference Point Session Dosage Given: 2.5 Gy
Session Number: 18

## 2024-02-26 ENCOUNTER — Ambulatory Visit
Admission: RE | Admit: 2024-02-26 | Discharge: 2024-02-26 | Disposition: A | Discharge status: Home / Self Care | Source: Ambulatory Visit | Attending: Radiation Oncology | Admitting: Radiation Oncology

## 2024-02-26 ENCOUNTER — Other Ambulatory Visit: Payer: Self-pay

## 2024-02-26 DIAGNOSIS — Z510 Encounter for antineoplastic radiation therapy: Secondary | ICD-10-CM | POA: Diagnosis not present

## 2024-02-26 LAB — RAD ONC ARIA SESSION SUMMARY
Course Elapsed Days: 35
Plan Fractions Treated to Date: 19
Plan Prescribed Dose Per Fraction: 2.5 Gy
Plan Total Fractions Prescribed: 20
Plan Total Prescribed Dose: 50 Gy
Reference Point Dosage Given to Date: 47.5 Gy
Reference Point Session Dosage Given: 2.5 Gy
Session Number: 19

## 2024-02-27 ENCOUNTER — Ambulatory Visit
Admission: RE | Admit: 2024-02-27 | Discharge: 2024-02-27 | Disposition: A | Discharge status: Home / Self Care | Source: Ambulatory Visit | Attending: Radiation Oncology | Admitting: Radiation Oncology

## 2024-02-27 ENCOUNTER — Other Ambulatory Visit: Payer: Self-pay

## 2024-02-27 DIAGNOSIS — Z510 Encounter for antineoplastic radiation therapy: Secondary | ICD-10-CM | POA: Diagnosis not present

## 2024-02-27 LAB — RAD ONC ARIA SESSION SUMMARY
Course Elapsed Days: 36
Plan Fractions Treated to Date: 20
Plan Prescribed Dose Per Fraction: 2.5 Gy
Plan Total Fractions Prescribed: 20
Plan Total Prescribed Dose: 50 Gy
Reference Point Dosage Given to Date: 50 Gy
Reference Point Session Dosage Given: 2.5 Gy
Session Number: 20

## 2024-02-28 ENCOUNTER — Ambulatory Visit: Admitting: Internal Medicine

## 2024-02-28 NOTE — Radiation Completion Notes (Signed)
 Patient Name: Roy [name], Roy [name] MRN: [id number] Date of Birth: 1950/09/27 Referring Physician: KEVIN SHI, M.D. Date of Service: 2024-02-28 Radiation Oncologist: Lauraine Golden, M.D.  [facility] [place]                             RADIATION ONCOLOGY END OF TREATMENT NOTE     Diagnosis: C44.329 Squamous cell carcinoma of skin of other parts of face Staging on 2023-12-17: 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 T=pT3, N=pN0, M=cM0 Intent: Curative     ==========DELIVERED PLANS==========  First Treatment Date: 2024-01-22 Last Treatment Date: 2024-02-27   Plan Name: [id number] Site: Cheek, Left Technique: Electron Mode: Electron Dose Per Fraction: 2.5 Gy Prescribed Dose (Delivered / Prescribed): 50 Gy / 50 Gy Prescribed Fxs (Delivered / Prescribed): 20 / 20     ==========ON TREATMENT VISIT DATES========== 2024-01-27, 2024-02-03, 2024-02-10, 2024-02-17, 2024-02-25     ==========UPCOMING VISITS========== 05/14/2024 [facility]-UROLOGY [facility] OFFICE VISIT Watt Rush, MD  04/14/2024 [facility]-ENDOCRINOLOGY OFFICE VISIT SPECIALTY Lenis Ethelle ORN, MD  03/24/2024 [facility]-RADIATION ONC FOLLOW UP 30 Wyatt Leeroy HERO, [place]  03/23/2024 [facility] AT [facility] HOME REMOTE PACER [facility] HVT DEVICE REMOTES  03/18/2024 [facility] OFFICE VISIT Geofm Delon BRAVO, NP  03/13/2024 [facility] AT [facility] PHYS PACER CK Aniceto Daphne CROME, NP  03/11/2024 [facility] PORT FLUSH W/LAB AP-ACAPA NURSE        ==========APPENDIX - ON TREATMENT VISIT NOTES==========   See weekly On Treatment Notes in Epic for details in the Media tab (listed as Progress notes on the On Treatment Visit Dates listed above).

## 2024-03-11 ENCOUNTER — Inpatient Hospital Stay: Attending: Hematology

## 2024-03-11 VITALS — BP 115/71 | HR 66 | Temp 97.9°F | Resp 16

## 2024-03-11 DIAGNOSIS — C8338 Diffuse large B-cell lymphoma, lymph nodes of multiple sites: Secondary | ICD-10-CM | POA: Insufficient documentation

## 2024-03-11 DIAGNOSIS — Z8349 Family history of other endocrine, nutritional and metabolic diseases: Secondary | ICD-10-CM | POA: Insufficient documentation

## 2024-03-11 DIAGNOSIS — K5900 Constipation, unspecified: Secondary | ICD-10-CM | POA: Insufficient documentation

## 2024-03-11 DIAGNOSIS — Z7952 Long term (current) use of systemic steroids: Secondary | ICD-10-CM | POA: Insufficient documentation

## 2024-03-11 DIAGNOSIS — R0602 Shortness of breath: Secondary | ICD-10-CM | POA: Insufficient documentation

## 2024-03-11 DIAGNOSIS — R519 Headache, unspecified: Secondary | ICD-10-CM | POA: Insufficient documentation

## 2024-03-11 DIAGNOSIS — Z95828 Presence of other vascular implants and grafts: Secondary | ICD-10-CM

## 2024-03-11 DIAGNOSIS — Z452 Encounter for adjustment and management of vascular access device: Secondary | ICD-10-CM | POA: Insufficient documentation

## 2024-03-11 DIAGNOSIS — Z79899 Other long term (current) drug therapy: Secondary | ICD-10-CM | POA: Insufficient documentation

## 2024-03-11 DIAGNOSIS — E2740 Unspecified adrenocortical insufficiency: Secondary | ICD-10-CM | POA: Insufficient documentation

## 2024-03-11 DIAGNOSIS — R053 Chronic cough: Secondary | ICD-10-CM | POA: Insufficient documentation

## 2024-03-11 DIAGNOSIS — D119 Benign neoplasm of major salivary gland, unspecified: Secondary | ICD-10-CM | POA: Insufficient documentation

## 2024-03-11 DIAGNOSIS — G629 Polyneuropathy, unspecified: Secondary | ICD-10-CM | POA: Insufficient documentation

## 2024-03-11 LAB — CBC WITH DIFFERENTIAL/PLATELET
Abs Immature Granulocytes: 0.04 K/uL (ref 0.00–0.07)
Basophils Absolute: 0.1 K/uL (ref 0.0–0.1)
Basophils Relative: 1 %
Eosinophils Absolute: 0.1 K/uL (ref 0.0–0.5)
Eosinophils Relative: 1 %
HCT: 55 % — ABNORMAL HIGH (ref 39.0–52.0)
Hemoglobin: 18.3 g/dL — ABNORMAL HIGH (ref 13.0–17.0)
Immature Granulocytes: 0 %
Lymphocytes Relative: 18 %
Lymphs Abs: 2.3 K/uL (ref 0.7–4.0)
MCH: 30.2 pg (ref 26.0–34.0)
MCHC: 33.3 g/dL (ref 30.0–36.0)
MCV: 90.9 fL (ref 80.0–100.0)
Monocytes Absolute: 0.7 K/uL (ref 0.1–1.0)
Monocytes Relative: 6 %
Neutro Abs: 9.4 K/uL — ABNORMAL HIGH (ref 1.7–7.7)
Neutrophils Relative %: 74 %
Platelets: 252 K/uL (ref 150–400)
RBC: 6.05 MIL/uL — ABNORMAL HIGH (ref 4.22–5.81)
RDW: 15.3 % (ref 11.5–15.5)
WBC: 12.6 K/uL — ABNORMAL HIGH (ref 4.0–10.5)
nRBC: 0 % (ref 0.0–0.2)

## 2024-03-11 LAB — COMPREHENSIVE METABOLIC PANEL WITH GFR
ALT: 29 U/L (ref 0–44)
AST: 35 U/L (ref 15–41)
Albumin: 4.1 g/dL (ref 3.5–5.0)
Alkaline Phosphatase: 100 U/L (ref 38–126)
Anion gap: 13 (ref 5–15)
BUN: 13 mg/dL (ref 8–23)
CO2: 29 mmol/L (ref 22–32)
Calcium: 9.9 mg/dL (ref 8.9–10.3)
Chloride: 98 mmol/L (ref 98–111)
Creatinine, Ser: 0.8 mg/dL (ref 0.61–1.24)
GFR, Estimated: >60 mL/min (ref >60)
Glucose, Bld: 113 mg/dL — ABNORMAL HIGH (ref 70–99)
Potassium: 4 mmol/L (ref 3.5–5.1)
Sodium: 141 mmol/L (ref 135–145)
Total Bilirubin: 1.2 mg/dL (ref 0.0–1.2)
Total Protein: 7 g/dL (ref 6.5–8.1)

## 2024-03-11 LAB — LACTATE DEHYDROGENASE: LDH: 193 U/L — ABNORMAL HIGH (ref 98–192)

## 2024-03-11 NOTE — Progress Notes (Signed)
 Patients port flushed without difficulty.  Good blood return noted with no bruising or swelling noted at site.  Gauze dressing applied and labs drawn per orders.  VSS with discharge and left in satisfactory condition with no s/s of distress noted. All follow ups as scheduled.       Gerrald Basu

## 2024-03-13 ENCOUNTER — Ambulatory Visit: Admitting: Pulmonary Disease

## 2024-03-15 NOTE — Progress Notes (Deleted)
  Electrophysiology Office Note:   Date:  03/15/2024  ID:  Roy [name], DOB [birth year], MRN [id number]  Primary Cardiologist: None Primary Heart Failure: None Electrophysiologist: Will Gladis Norton, MD   {Click to update primary MD,subspecialty MD or APP then REFRESH:1}    History of Present Illness:   Roy [name] is a 73 y.o. 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    Since last being seen in our clinic the patient reports doing ***.    He***denies chest pain, palpitations, dyspnea, PND, orthopnea, nausea, vomiting, dizziness, syncope, edema, weight gain, or early satiety.   Review of systems complete and found to be negative unless listed in HPI.   EP Information / Studies Reviewed:    EKG is not ordered today. EKG from 08/09/23 reviewed which showed AF, VP 62 bpm      PPM Interrogation-  reviewed in detail today,  See PACEART report.  Device History: Medtronic Dual Chamber PPM implanted 09/07/22 for CHB  Risk Assessment/Calculations:    CHA2DS2-VASc Score = 4  {Confirm score is correct.  If not, click here to update score.  REFRESH note.  :1} This indicates a 4.8% annual risk of stroke. The patient's score is based upon: CHF History: 1 HTN History: 1 Diabetes History: 0 Stroke History: 0 Vascular Disease History: 1 Age Score: 1 Gender Score: 0   {This patient has a significant risk of stroke if diagnosed with atrial fibrillation.  Please consider VKA or DOAC agent for anticoagulation if the bleeding risk is acceptable.   You can also use the SmartPhrase .[email or web address] for documentation.   :[id number]} No BP recorded.  {Refresh Note OR Click here to enter BP  :1}***        Physical Exam:   VS:  There were no vitals taken for this visit.    Wt Readings from Last 3 Encounters:  01/02/24 258 lb (117 kg)  12/17/23 257 lb 2 oz (116.6 kg)  10/15/23 251 lb 12.8 oz (114.2 kg)     GEN: Well nourished, well developed in no acute distress NECK: No JVD; No carotid bruits CARDIAC: {EPRHYTHM:28826}, no murmurs, rubs, gallops RESPIRATORY:  Clear to auscultation without rales, wheezing or rhonchi  ABDOMEN: Soft, non-tender, non-distended EXTREMITIES:  No edema; No deformity   ASSESSMENT AND PLAN:    CHB s/p Medtronic PPM  -Normal PPM function -See Pace Art report -No changes today -VVIR in setting of AF  -device check post radiation with normal device function ***  Persistent Atrial Fibrillation  CHA2DS2-VASc 4  -OAC for stroke prophylaxis   Secondary Hypercoagulable State  -continue Eliquis  5mg  BID, dose reviewed and appropriate by age / wt   Hypertension  -well controlled on current regimen ***  HFpEF  -euvolemic on exam ***  Disposition:   Follow up with {EPPROVIDERS:28135::EP Team} {EPFOLLOW UP:28173} *** Pt prefers to follow up in [place] if possible   Signed, Daphne Barrack, NP-C, AGACNP-BC [facility] - Electrophysiology  03/15/2024, 9:32 AM

## 2024-03-18 ENCOUNTER — Inpatient Hospital Stay (HOSPITAL_BASED_OUTPATIENT_CLINIC_OR_DEPARTMENT_OTHER): Admitting: Oncology

## 2024-03-18 VITALS — BP 138/88 | HR 85 | Temp 98.0°F | Resp 18 | Ht 72.0 in | Wt 260.0 lb

## 2024-03-18 DIAGNOSIS — C8510 Unspecified B-cell lymphoma, unspecified site: Secondary | ICD-10-CM

## 2024-03-18 MED ORDER — GABAPENTIN 100 MG PO CAPS: 100.0000 mg | ORAL_CAPSULE | Freq: Three times a day (TID) | ORAL | 2 refills | Status: AC

## 2024-03-18 NOTE — Progress Notes (Signed)
 [facility] [address], [place] [address]    Clinic Day:  03/18/2024  Referring physician: Baird Comer [name], NP  Patient Care Team: Baird Comer [name], NP as PCP - General (Adult Health Nurse Practitioner) Inocencio Soyla Lunger, MD as PCP - Electrophysiology (Cardiology) Malmfelt, Delon CROME, RN as Oncology Nurse Navigator Izell Domino, MD as Consulting Physician (Radiation Oncology) Robina Drivers, MD as Referring Physician (Dermatology)   ASSESSMENT & PLAN:   Assessment: 1.  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  High-grade lymphoma panel was negative. - Weight loss of 40 pounds in the last 4 months, stable weight over the last 1 month.  No fevers or night sweats.  -PET CT scan on 03/16/2021 which showed hypermetabolic enlarged spleen.  There are hypermetabolic neck lymph nodes.  Diffuse hypermetabolic activity within the marrow space of the spine.  Diffuse groundglass density within the lungs with moderate activity. - 6 cycles of R-CHOP from 04/13/2021 through 08/14/2021 - PET scan (09/22/2021): No evidence of metabolically active lymphoma, Deauville 1.    2.  Social/family history: - He lives at home by himself.  His son is present today with him. - He is independent of ADLs and IADLs.  Son lives within 30 minutes.  He was never smoker. - He has work-related exposure to dyes.  No pesticide exposure. - Brother died of bone cancer.  Another brother also died of bone cancer.    Plan: 1.  Diffuse large B-cell lymphoma, stage IVb: - Denies any fevers or night sweats.  - Physical exam: Did not reveal any palpable adenopathy. - Labs today: Normal LFTs.  Slight elevation to LDH.  CBC shows leukocytosis likely secondary to steroids, erythrocytosis likely due to recent infection and  dehydration.  Differential is grossly unremarkable. - Imaging studies from hospitalization from 08/08/2023 and 07/12/2023: No evidence of lymphadenopathy.  Spleen size was normal. - RTC 6 months for follow-up with repeat labs and exam.    2.  Parotid masses: - PET scan showed bilateral parotid masses clinically consistent with Warthin's tumors. - He had to reschedule multiple biopsy appointments due to recurrent hospitalizations. - He would like to hold off on biopsies at this time.      3.  Peripheral neuropathy: - Reportedly tried Lyrica  in the past but prefers gabapentin . -He is wondering if he can get a refill on gabapentin  100 mg tablets.  New Rx sent.  4.  Adrenal insufficiency: - Continue prednisone  10 mg daily and fludrocortisone  0.1 mg daily.    Orders Placed This Encounter  Procedures   CBC with Differential    Standing Status:   Future    Expected Date:   09/15/2024    Expiration Date:   12/14/2024   Comprehensive metabolic panel    Standing Status:   Future    Expected Date:   09/15/2024    Expiration Date:   12/14/2024   Lactate dehydrogenase    Standing Status:   Future    Expected Date:   09/15/2024    Expiration Date:   12/14/2024    Delon FORBES Hope, NP   11/12/202512:52 PM  CHIEF COMPLAINT:   Diagnosis: diffuse large B-cell lymphoma, stage IVb    Cancer Staging  Large B-cell lymphoma (HCC) Staging form: Hodgkin and Non-Hodgkin Lymphoma, AJCC 8th Edition - Clinical stage from 03/22/2021: Stage IV (Diffuse  large B-cell lymphoma) - Signed by Rogers Hai, MD on 03/22/2021  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 Staging form: Cutaneous Carcinoma of the Head and Neck, AJCC 8th Edition - Pathologic stage from 12/17/2023: Stage III (pT3, pN0, cM0) - Signed by Izell Domino, MD on 12/18/2023    Prior Therapy: 6 cycles of R-CHOP completed on 08/14/2021   Current Therapy:  Surveillance    HISTORY OF PRESENT ILLNESS:   Oncology History  Large B-cell  lymphoma (HCC)  03/22/2021 Initial Diagnosis   DLBCL (diffuse large B cell lymphoma) (HCC)   03/22/2021 Cancer Staging   Staging form: Hodgkin and Non-Hodgkin Lymphoma, AJCC 8th Edition - Clinical stage from 03/22/2021: Stage IV (Diffuse large B-cell lymphoma) - Signed by Rogers Hai, MD on 03/22/2021   04/12/2021 - 08/16/2021 Chemotherapy   Patient is on Treatment Plan : NON-HODGKINS LYMPHOMA R-CHOP q21d     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 male presenting to clinic today for follow up of diffuse large B-cell lymphoma, stage IVb.   Recently treated for left ear infection with a Z-Pak.  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 Izell.  Last treatment was on 02/27/2024.  Reports there is another spot on his right ear that he is scheduled for the first of the year to have it looked at.  Reports he will likely need it surgically removed and possible radiation.  Reports he was hospitalized for several months at the beginning of the year but denies any recent hospitalizations.  Denies any recent falls.  He continues to have left facial pain where the radiation and surgery took place.  Reports he has gained about 4 pounds.  Reports he is not interested in additional radiation as it made him feel really tired and weak all the time.  Today, he states that he is doing well overall. His appetite level is at 100%. His energy level is at 75%.  Reports chronic cough and shortness of breath.  He does a walker for ambulation especially  for long distances.  He has constipation at times.  Has headaches.  Has numbness and tingling in left foot and leg.  Overall is feeling stronger.  He denies any new lumps or bumps.  PAST MEDICAL HISTORY:   Past Medical History: Past Medical History:  Diagnosis Date   Atrial fibrillation (HCC)    Cancer (HCC)    Diffuse Large B-Cell lymphoma   GERD (gastroesophageal reflux disease)    High cholesterol    Hypertension    Obesity    Pacemaker     Surgical History: Past Surgical History:  Procedure Laterality Date   APPENDECTOMY     BIOPSY  06/28/2023   Procedure: BIOPSY;  Surgeon: Shaaron Lamar HERO, MD;  Location: [facility];  Service: Endoscopy;;   CATARACT EXTRACTION Bilateral 2022   CHOLECYSTECTOMY     ESOPHAGEAL DILATION     multiple times   ESOPHAGOGASTRODUODENOSCOPY (EGD) WITH PROPOFOL  N/A 06/28/2023   Procedure: ESOPHAGOGASTRODUODENOSCOPY (EGD) WITH PROPOFOL ;  Surgeon: Shaaron,  Lamar HERO, MD;  Location: [facility];  Service: Endoscopy;  Laterality: N/A;   IR IMAGING GUIDED PORT INSERTION  03/31/2021   JOINT REPLACEMENT Left    hip   LUMBAR PERCUTANEOUS PEDICLE SCREW 3 LEVEL  05/27/2023   Procedure: POSTERIOR FIXATION AND FUSION OF THORACIC ELEVEN-THORACIC TWELVE FRACTURE DISLOCATION. FIXATION FROM THORACIC TEN-THORACIC ELEVEN METHACRYLATE AUGMENTATION WITH FLUOROSCOPIC GUIDANCE;  Surgeon: Colon Shove, MD;  Location: [facility] OR;  Service: Neurosurgery;;   AGAPITO DILATION N/A 06/28/2023   Procedure: AGAPITO HODGKIN;  Surgeon: Shaaron Lamar HERO, MD;  Location: [facility];  Service: Endoscopy;  Laterality: N/A;   PACEMAKER IMPLANT N/A 09/07/2022   Procedure: PACEMAKER IMPLANT;  Surgeon: Inocencio Soyla Lunger, MD;  Location: [facility];  Service: Cardiovascular;  Laterality: N/A;    Social History: Social History   Socioeconomic History   Marital status: Widowed    Spouse name: Not on file   Number of children: 3   Years of education: Not on file   Highest education  level: Not on file  Occupational History   Occupation: Retired  Tobacco Use   Smoking status: Never    Passive exposure: Never   Smokeless tobacco: Never  Vaping Use   Vaping status: Never Used  Substance and Sexual Activity   Alcohol use: No   Drug use: No   Sexual activity: Not Currently  Other Topics Concern   Not on file  Social History Narrative   Not on file   Social Drivers of Health   Financial Resource Strain: Low Risk  (05/15/2023)   Received from [facility]   Overall Financial Resource Strain (CARDIA)    Difficulty of Paying Living Expenses: Not very hard  Food Insecurity: No Food Insecurity (12/17/2023)   Hunger Vital Sign    Worried About Running Out of Food in the Last Year: Never true    Ran Out of Food in the Last Year: Never true  Transportation Needs: No Transportation Needs (12/17/2023)   PRAPARE - Administrator, Civil Service (Medical): No    Lack of Transportation (Non-Medical): No  Physical Activity: Unknown (04/25/2023)   Received from [facility]   Exercise Vital Sign    On average, how many days per week do you engage in moderate to strenuous exercise (like a brisk walk)?: 0 days    Minutes of Exercise per Session: Not on file  Recent Concern: Physical Activity - Inactive (04/25/2023)   Received from [facility]   Exercise Vital Sign    Days of Exercise per Week: 0 days    Minutes of Exercise per Session: 60 min  Stress: No Stress Concern Present (04/25/2023)   Received from [facility] - Occupational Stress Questionnaire    Feeling of Stress : Not at all  Social Connections: Moderately Isolated (08/08/2023)   Social Connection and Isolation Panel    Frequency of Communication with Friends and Family: Three times a week    Frequency of Social Gatherings with Friends and Family: Once a week    Attends Religious Services: More than 4 times per year    Active Member of [employer] or  Organizations: No    Attends Banker Meetings: Never    Marital Status: Widowed  Intimate Partner Violence: Not At Risk (12/17/2023)   Humiliation, Afraid, Rape, and Kick questionnaire    Fear of Current or Ex-Partner: No    Emotionally Abused: No    Physically  Abused: No    Sexually Abused: No    Family History: Family History  Problem Relation Age of Onset   Heart failure Father    Heart attack Father        Deceased    Thyroid  disease Sister     Current Medications:  Current Outpatient Medications:    acetaminophen  (TYLENOL ) 325 MG tablet, Take 2 tablets (650 mg total) by mouth every 6 (six) hours as needed for mild pain (pain score 1-3), fever or headache (or Fever >/= 101)., Disp: , Rfl:    calcium  carbonate (OS-CAL - DOSED IN MG OF ELEMENTAL CALCIUM ) 1250 (500 Ca) MG tablet, Take 1 tablet (1,250 mg total) by mouth 2 (two) times daily with a meal., Disp: , Rfl:    colchicine 0.6 MG tablet, Take 0.6 mg by mouth 2 (two) times daily., Disp: , Rfl:    ELIQUIS  2.5 MG TABS tablet, Take 2.5 mg by mouth 2 (two) times daily. (Patient taking differently: Take 2.5 mg by mouth 2 (two) times daily. 1.5 twice a day), Disp: , Rfl:    ferrous sulfate  325 (65 FE) MG tablet, Take 1 tablet (325 mg total) by mouth daily with breakfast., Disp: 30 tablet, Rfl: 0   furosemide  (LASIX ) 20 MG tablet, Take 20 mg by mouth daily., Disp: , Rfl:    gabapentin  (NEURONTIN ) 100 MG capsule, Take 1 capsule (100 mg total) by mouth 3 (three) times daily., Disp: 90 capsule, Rfl: 2   GNP ZINC OXIDE 20 % ointment, Apply 1 Application topically every 8 (eight) hours as needed for irritation or diaper changes., Disp: , Rfl:    levothyroxine  (SYNTHROID ) 100 MCG tablet, Take 1 tablet (100 mcg total) by mouth daily before breakfast., Disp: 30 tablet, Rfl: 0   magnesium  oxide (MAG-OX) 400 (240 Mg) MG tablet, Take 1 tablet (400 mg total) by mouth daily., Disp: 30 tablet, Rfl: 0   meclizine  (ANTIVERT ) 25 MG tablet,  Take 25 mg by mouth 3 (three) times daily as needed., Disp: , Rfl:    methocarbamol  (ROBAXIN ) 500 MG tablet, Take 1 tablet (500 mg total) by mouth every 6 (six) hours as needed for muscle spasms., Disp: 20 tablet, Rfl: 0   midodrine  (PROAMATINE ) 10 MG tablet, Take 1 tablet (10 mg total) by mouth 3 (three) times daily., Disp: 90 tablet, Rfl: 3   pantoprazole  (PROTONIX ) 40 MG tablet, Take 1 tablet (40 mg total) by mouth daily., Disp: 30 tablet, Rfl: 5   polyethylene glycol (MIRALAX ) 17 g packet, Take 17 g by mouth daily., Disp: 30 each, Rfl: 4   Potassium Chloride  ER 20 MEQ TBCR, Take 1 tablet (20 mEq total) by mouth 2 (two) times daily. take while taking Lasix /furosemide , Disp: 60 tablet, Rfl: 1   predniSONE  (DELTASONE ) 10 MG tablet, Take 1 tablet (10 mg total) by mouth daily with breakfast., Disp: 90 tablet, Rfl: 0   rosuvastatin  (CRESTOR ) 5 MG tablet, Take 1 tablet (5 mg total) by mouth at bedtime., Disp: 30 tablet, Rfl: 0   senna-docusate (SENOKOT-S) 8.6-50 MG tablet, Take 2 tablets by mouth at bedtime., Disp: 60 tablet, Rfl: 1   vitamin B-12 (CYANOCOBALAMIN ) 1000 MCG tablet, Take 1 tablet (1,000 mcg total) by mouth daily., Disp: 30 tablet, Rfl: 3   Vitamin D , Ergocalciferol , (DRISDOL ) 1.25 MG (50000 UNIT) CAPS capsule, Take 1 capsule (50,000 Units total) by mouth every 7 (seven) days., Disp: 4 capsule, Rfl: 0   guaiFENesin  (MUCINEX ) 600 MG 12 hr tablet, Take 1 tablet (600 mg  total) by mouth 2 (two) times daily. (Patient not taking: Reported on 03/18/2024), Disp: 60 tablet, Rfl: 2   pregabalin  (LYRICA ) 150 MG capsule, 2 (two) times daily. (Patient not taking: Reported on 03/18/2024), Disp: , Rfl:    valACYclovir  (VALTREX ) 1000 MG tablet, Take 1 tablet (1,000 mg total) by mouth daily. (Patient not taking: Reported on 03/18/2024), Disp: 30 tablet, Rfl: 0   Allergies: Allergies  Allergen Reactions   Sulfa Antibiotics Other (See Comments)    Unknown childhood reaction   Adhesive [Tape] Other (See  Comments)    Contact dermatitis    REVIEW OF SYSTEMS:   Review of Systems  Constitutional:  Positive for fatigue.  Respiratory:  Positive for cough and shortness of breath.   Gastrointestinal:  Positive for constipation.  Skin:  Positive for wound.  Neurological:  Positive for headaches.     VITALS:   Blood pressure 138/88, pulse 85, temperature 98 F (36.7 C), temperature source Tympanic, resp. rate 18, height 6' (1.829 m), weight 260 lb (117.9 kg), SpO2 99%.  Wt Readings from Last 3 Encounters:  03/18/24 260 lb (117.9 kg)  01/02/24 258 lb (117 kg)  12/17/23 257 lb 2 oz (116.6 kg)    Body mass index is 35.26 kg/m.  Performance status (ECOG): 1 - Symptomatic but completely ambulatory  PHYSICAL EXAM:   Physical Exam Vitals and nursing note reviewed. Exam conducted with a chaperone present.  Constitutional:      Appearance: Normal appearance.  Cardiovascular:     Rate and Rhythm: Normal rate and regular rhythm.     Pulses: Normal pulses.     Heart sounds: Normal heart sounds.  Pulmonary:     Effort: Pulmonary effort is normal.     Breath sounds: Normal breath sounds.  Abdominal:     General: Bowel sounds are normal.     Palpations: Abdomen is soft. There is no hepatomegaly, splenomegaly or mass.     Tenderness: There is no abdominal tenderness.  Musculoskeletal:        General: No swelling. Normal range of motion.     Right lower leg: No edema.     Left lower leg: No edema.  Lymphadenopathy:     Cervical: No cervical adenopathy.     Right cervical: No superficial, deep or posterior cervical adenopathy.    Left cervical: No superficial, deep or posterior cervical adenopathy.     Upper Body:     Right upper body: No supraclavicular or axillary adenopathy.     Left upper body: No supraclavicular or axillary adenopathy.  Neurological:     General: No focal deficit present.     Mental Status: He is alert and oriented to person, place, and time. Mental status is at  baseline.  Psychiatric:        Mood and Affect: Mood normal.        Behavior: Behavior normal.     LABS:   CBC     Component Value Date/Time   WBC 12.6 (H) 03/11/2024 1127   RBC 6.05 (H) 03/11/2024 1127   HGB 18.3 (H) 03/11/2024 1127   HCT 55.0 (H) 03/11/2024 1127   PLT 252 03/11/2024 1127   MCV 90.9 03/11/2024 1127   MCH 30.2 03/11/2024 1127   MCHC 33.3 03/11/2024 1127   RDW 15.3 03/11/2024 1127   LYMPHSABS 2.3 03/11/2024 1127   MONOABS 0.7 03/11/2024 1127   EOSABS 0.1 03/11/2024 1127   BASOSABS 0.1 03/11/2024 1127    CMP  Component Value Date/Time   NA 141 03/11/2024 1127   NA 141 10/10/2023 1131   K 4.0 03/11/2024 1127   CL 98 03/11/2024 1127   CO2 29 03/11/2024 1127   GLUCOSE 113 (H) 03/11/2024 1127   BUN 13 03/11/2024 1127   BUN 14 10/10/2023 1131   CREATININE 0.80 03/11/2024 1127   CREATININE 0.82 11/11/2020 1046   CALCIUM  9.9 03/11/2024 1127   PROT 7.0 03/11/2024 1127   PROT 6.7 10/10/2023 1131   ALBUMIN 4.1 03/11/2024 1127   ALBUMIN 4.3 10/10/2023 1131   AST 35 03/11/2024 1127   AST 74 (H) 11/11/2020 1046   ALT 29 03/11/2024 1127   ALT 21 11/11/2020 1046   ALKPHOS 100 03/11/2024 1127   BILITOT 1.2 03/11/2024 1127   BILITOT 0.8 10/10/2023 1131   BILITOT 2.3 (H) 11/11/2020 1046   GFRNONAA >60 03/11/2024 1127   GFRNONAA >60 11/11/2020 1046   GFRAA >60 04/29/2019 1033     No results found for: CEA1, CEA / No results found for: CEA1, CEA No results found for: PSA1 No results found for: CAN199 No results found for: RJW874  Lab Results  Component Value Date   TOTALPROTELP 6.1 02/28/2021   ALBUMINELP 3.5 02/21/2021   A1GS 0.3 02/21/2021   A2GS 0.8 02/21/2021   BETS 1.0 02/21/2021   GAMS 0.8 02/21/2021   MSPIKE Not Observed 02/21/2021   SPEI Comment 02/21/2021   Lab Results  Component Value Date   TIBC 416 02/21/2021   TIBC 369 11/11/2020   FERRITIN 152 02/21/2021   IRONPCTSAT 17 (L) 02/21/2021   IRONPCTSAT 41 (H)  11/11/2020   Lab Results  Component Value Date   LDH 193 (H) 03/11/2024   LDH 139 09/11/2023   LDH 146 04/01/2023     STUDIES:   No results found.

## 2024-03-22 LAB — CUP PACEART REMOTE DEVICE CHECK
Battery Remaining Longevity: 132 mo
Battery Voltage: 3.02 V
Brady Statistic RA Percent Paced: 0 %
Brady Statistic RV Percent Paced: 98.94 %
Date Time Interrogation Session: 20251116212026
Implantable Lead Connection Status: 753985
Implantable Lead Connection Status: 753985
Implantable Lead Implant Date: 20240503
Implantable Lead Implant Date: 20240503
Implantable Lead Location: 753859
Implantable Lead Location: 753860
Implantable Lead Model: 3830
Implantable Lead Model: 5076
Implantable Pulse Generator Implant Date: 20240503
Lead Channel Impedance Value: 399 Ohm
Lead Channel Impedance Value: 399 Ohm
Lead Channel Impedance Value: 608 Ohm
Lead Channel Impedance Value: 646 Ohm
Lead Channel Pacing Threshold Amplitude: 1 V
Lead Channel Pacing Threshold Pulse Width: 0.4 ms
Lead Channel Sensing Intrinsic Amplitude: 2 mV
Lead Channel Sensing Intrinsic Amplitude: 2 mV
Lead Channel Sensing Intrinsic Amplitude: 24.25 mV
Lead Channel Sensing Intrinsic Amplitude: 24.25 mV
Lead Channel Setting Pacing Amplitude: 2.5 V
Lead Channel Setting Pacing Amplitude: 2.5 V
Lead Channel Setting Pacing Pulse Width: 0.4 ms
Lead Channel Setting Sensing Sensitivity: 2.8 mV
Zone Setting Status: 755011
Zone Setting Status: 755011

## 2024-03-23 ENCOUNTER — Ambulatory Visit (INDEPENDENT_AMBULATORY_CARE_PROVIDER_SITE_OTHER): Payer: 59

## 2024-03-23 DIAGNOSIS — I4819 Other persistent atrial fibrillation: Secondary | ICD-10-CM

## 2024-03-23 NOTE — Progress Notes (Incomplete)
 Radiation Oncology         [phone] ________________________________  Name: Roy [name] MRN: [id number]  Date: 03/24/2024  DOB: Aug 09, 1950  Follow-Up Visit Note  CC: Baird Comer [name], NP  Baird Comer [name], NP  Diagnosis and Prior Radiotherapy:    No diagnosis found. ***   Cancer Staging  Large B-cell lymphoma (HCC) Staging form: Hodgkin and Non-Hodgkin Lymphoma, AJCC 8th Edition - Clinical stage from 03/22/2021: Stage IV (Diffuse large B-cell lymphoma) - Signed by Rogers Hai, MD on 03/22/2021  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 Staging form: Cutaneous Carcinoma of the Head and Neck, AJCC 8th Edition - Pathologic stage from 12/17/2023: Stage III (pT3, pN0, cM0) - Signed by Izell Domino, MD on 12/18/2023 Stage prefix: Initial diagnosis Extraosseous extension: Absent    ==========DELIVERED PLANS==========  First Treatment Date: 2024-01-22 Last Treatment Date: 2024-02-27   Plan Name: [id number]_Cheek_BO Site: Cheek, Left Technique: Electron Mode: Electron Dose Per Fraction: 2.5 Gy Prescribed Dose (Delivered / Prescribed): 50 Gy / 50 Gy Prescribed Fxs (Delivered / Prescribed): 20 / 20  Squamous cell carcinoma the skin of the left cheek; s/p MOHS with positive perineural invasion and adjuvant radiation completed on 02/27/2024  CHIEF COMPLAINT:  Here for follow-up and surveillance of squamous cell carcinoma  Narrative:  The patient returns today for routine follow-up.  ***                    ALLERGIES:  is allergic to sulfa antibiotics and adhesive [tape].  Meds: Current Outpatient Medications  Medication Sig Dispense Refill   acetaminophen  (TYLENOL ) 325 MG tablet Take 2 tablets (650 mg total) by mouth every 6 (six) hours as needed for mild pain (pain score 1-3), fever or headache (or Fever >/= 101).     calcium  carbonate (OS-CAL - DOSED IN MG OF ELEMENTAL CALCIUM ) 1250 (500 Ca) MG tablet Take 1 tablet (1,250 mg total) by mouth 2 (two) times daily  with a meal.     colchicine 0.6 MG tablet Take 0.6 mg by mouth 2 (two) times daily.     ELIQUIS  2.5 MG TABS tablet Take 2.5 mg by mouth 2 (two) times daily. (Patient taking differently: Take 2.5 mg by mouth 2 (two) times daily. 1.5 twice a day)     ferrous sulfate  325 (65 FE) MG tablet Take 1 tablet (325 mg total) by mouth daily with breakfast. 30 tablet 0   furosemide  (LASIX ) 20 MG tablet Take 20 mg by mouth daily.     gabapentin  (NEURONTIN ) 100 MG capsule Take 1 capsule (100 mg total) by mouth 3 (three) times daily. 90 capsule 2   GNP ZINC OXIDE 20 % ointment Apply 1 Application topically every 8 (eight) hours as needed for irritation or diaper changes.     guaiFENesin  (MUCINEX ) 600 MG 12 hr tablet Take 1 tablet (600 mg total) by mouth 2 (two) times daily. (Patient not taking: Reported on 03/18/2024) 60 tablet 2   levothyroxine  (SYNTHROID ) 100 MCG tablet Take 1 tablet (100 mcg total) by mouth daily before breakfast. 30 tablet 0   magnesium  oxide (MAG-OX) 400 (240 Mg) MG tablet Take 1 tablet (400 mg total) by mouth daily. 30 tablet 0   meclizine  (ANTIVERT ) 25 MG tablet Take 25 mg by mouth 3 (three) times daily as needed.     methocarbamol  (ROBAXIN ) 500 MG tablet Take 1 tablet (500 mg total) by mouth every 6 (six) hours as needed for muscle spasms. 20 tablet 0   midodrine  (  PROAMATINE ) 10 MG tablet Take 1 tablet (10 mg total) by mouth 3 (three) times daily. 90 tablet 3   pantoprazole  (PROTONIX ) 40 MG tablet Take 1 tablet (40 mg total) by mouth daily. 30 tablet 5   polyethylene glycol (MIRALAX ) 17 g packet Take 17 g by mouth daily. 30 each 4   Potassium Chloride  ER 20 MEQ TBCR Take 1 tablet (20 mEq total) by mouth 2 (two) times daily. take while taking Lasix /furosemide  60 tablet 1   predniSONE  (DELTASONE ) 10 MG tablet Take 1 tablet (10 mg total) by mouth daily with breakfast. 90 tablet 0   pregabalin  (LYRICA ) 150 MG capsule 2 (two) times daily. (Patient not taking: Reported on 03/18/2024)      rosuvastatin  (CRESTOR ) 5 MG tablet Take 1 tablet (5 mg total) by mouth at bedtime. 30 tablet 0   senna-docusate (SENOKOT-S) 8.6-50 MG tablet Take 2 tablets by mouth at bedtime. 60 tablet 1   valACYclovir  (VALTREX ) 1000 MG tablet Take 1 tablet (1,000 mg total) by mouth daily. (Patient not taking: Reported on 03/18/2024) 30 tablet 0   vitamin B-12 (CYANOCOBALAMIN ) 1000 MCG tablet Take 1 tablet (1,000 mcg total) by mouth daily. 30 tablet 3   Vitamin D , Ergocalciferol , (DRISDOL ) 1.25 MG (50000 UNIT) CAPS capsule Take 1 capsule (50,000 Units total) by mouth every 7 (seven) days. 4 capsule 0   No current facility-administered medications for this visit.    Physical Findings: The patient is in no acute distress. Patient is alert and oriented. Wt Readings from Last 3 Encounters:  03/18/24 260 lb (117.9 kg)  01/02/24 258 lb (117 kg)  12/17/23 257 lb 2 oz (116.6 kg)    vitals were not taken for this visit. .  General: Alert and oriented, in no acute distress HEENT: Head is normocephalic. Extraocular movements are intact. Oropharynx is notable for *** Neck: Neck is notable for *** Skin: Skin in treatment fields shows satisfactory healing *** Heart: Regular in rate and rhythm with no murmurs, rubs, or gallops. Chest: Clear to auscultation bilaterally, with no rhonchi, wheezes, or rales. Abdomen: Soft, nontender, nondistended, with no rigidity or guarding. Extremities: No cyanosis or edema. Lymphatics: see Neck Exam Psychiatric: Judgment and insight are intact. Affect is appropriate.   Lab Findings: Lab Results  Component Value Date   WBC 12.6 (H) 03/11/2024   HGB 18.3 (H) 03/11/2024   HCT 55.0 (H) 03/11/2024   MCV 90.9 03/11/2024   PLT 252 03/11/2024    Lab Results  Component Value Date   TSH 1.340 10/10/2023    Radiographic Findings: No results found.  Impression/Plan:  Squamous cell carcinoma the skin of the left cheek; s/p MOHS with positive perineural invasion and adjuvant  radiation completed on 02/27/2024  1) Head and Neck Cancer Status: ***  2) Nutritional Status: *** PEG tube: ***  3) Risk Factors: The patient has been educated about risk factors including alcohol and tobacco abuse; they understand that avoidance of alcohol and tobacco is important to prevent recurrences as well as other cancers  4) Swallowing: ***  5) Dental: Encouraged to continue regular followup with dentistry, and dental hygiene including fluoride rinses. ***  6) Thyroid  function:  Lab Results  Component Value Date   TSH 1.340 10/10/2023    7) Other: ***  8) Follow-up in *** months. The patient was encouraged to call with any issues or questions before then.  On date of service, in total, I spent *** minutes on this encounter. Patient was seen in person. _____________________________________  Leeroy Due, PA-C

## 2024-03-23 NOTE — Progress Notes (Signed)
 Patient was rescheduled.

## 2024-03-24 ENCOUNTER — Ambulatory Visit
Admission: RE | Admit: 2024-03-24 | Discharge: 2024-03-24 | Disposition: A | Discharge status: Home / Self Care | Source: Ambulatory Visit | Attending: Radiology | Admitting: Radiology

## 2024-03-24 ENCOUNTER — Ambulatory Visit: Admitting: Pulmonary Disease

## 2024-03-24 ENCOUNTER — Ambulatory Visit: Payer: Self-pay | Admitting: Cardiology

## 2024-03-24 DIAGNOSIS — I10 Essential (primary) hypertension: Secondary | ICD-10-CM

## 2024-03-24 DIAGNOSIS — C44329 Squamous cell carcinoma of skin of other parts of face: Secondary | ICD-10-CM | POA: Insufficient documentation

## 2024-03-24 DIAGNOSIS — Z950 Presence of cardiac pacemaker: Secondary | ICD-10-CM

## 2024-03-24 DIAGNOSIS — I5032 Chronic diastolic (congestive) heart failure: Secondary | ICD-10-CM

## 2024-03-24 DIAGNOSIS — D6869 Other thrombophilia: Secondary | ICD-10-CM

## 2024-03-24 DIAGNOSIS — I4819 Other persistent atrial fibrillation: Secondary | ICD-10-CM

## 2024-03-24 DIAGNOSIS — Z510 Encounter for antineoplastic radiation therapy: Secondary | ICD-10-CM | POA: Insufficient documentation

## 2024-03-24 DIAGNOSIS — I442 Atrioventricular block, complete: Secondary | ICD-10-CM

## 2024-03-25 NOTE — Progress Notes (Signed)
 Remote PPM Transmission

## 2024-03-27 NOTE — Progress Notes (Incomplete)
 Radiation Oncology         [phone] ________________________________  Name: Roy [name] MRN: [id number]  Date: 03/31/2024  DOB: June 14, 1950  Follow-Up Visit Note  CC: Baird Comer [name], NP  Baird Comer [name], NP  Diagnosis and Prior Radiotherapy:    No diagnosis found. ***   Cancer Staging  Large B-cell lymphoma (HCC) Staging form: Hodgkin and Non-Hodgkin Lymphoma, AJCC 8th Edition - Clinical stage from 03/22/2021: Stage IV (Diffuse large B-cell lymphoma) - Signed by Rogers Hai, MD on 03/22/2021  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 Staging form: Cutaneous Carcinoma of the Head and Neck, AJCC 8th Edition - Pathologic stage from 12/17/2023: Stage III (pT3, pN0, cM0) - Signed by Izell Domino, MD on 12/18/2023 Stage prefix: Initial diagnosis Extraosseous extension: Absent    ==========DELIVERED PLANS==========  First Treatment Date: 2024-01-22 Last Treatment Date: 2024-02-27   Plan Name: HN_L_Cheek_BO Site: Cheek, Left Technique: Electron Mode: Electron Dose Per Fraction: 2.5 Gy Prescribed Dose (Delivered / Prescribed): 50 Gy / 50 Gy Prescribed Fxs (Delivered / Prescribed): 20 / 20  Squamous cell carcinoma the skin of the left cheek; s/p MOHS with positive perineural invasion and adjuvant radiation completed on 02/27/2024  CHIEF COMPLAINT:  Here for follow-up and surveillance of squamous cell carcinoma  Narrative:  The patient returns today for routine follow-up.  ***                    ALLERGIES:  is allergic to sulfa antibiotics and adhesive [tape].  Meds: Current Outpatient Medications  Medication Sig Dispense Refill   acetaminophen  (TYLENOL ) 325 MG tablet Take 2 tablets (650 mg total) by mouth every 6 (six) hours as needed for mild pain (pain score 1-3), fever or headache (or Fever >/= 101).     calcium  carbonate (OS-CAL - DOSED IN MG OF ELEMENTAL CALCIUM ) 1250 (500 Ca) MG tablet Take 1 tablet (1,250 mg total) by mouth 2 (two) times daily  with a meal.     colchicine 0.6 MG tablet Take 0.6 mg by mouth 2 (two) times daily.     ELIQUIS  2.5 MG TABS tablet Take 2.5 mg by mouth 2 (two) times daily. (Patient taking differently: Take 2.5 mg by mouth 2 (two) times daily. 1.5 twice a day)     ferrous sulfate  325 (65 FE) MG tablet Take 1 tablet (325 mg total) by mouth daily with breakfast. 30 tablet 0   furosemide  (LASIX ) 20 MG tablet Take 20 mg by mouth daily.     gabapentin  (NEURONTIN ) 100 MG capsule Take 1 capsule (100 mg total) by mouth 3 (three) times daily. 90 capsule 2   GNP ZINC OXIDE 20 % ointment Apply 1 Application topically every 8 (eight) hours as needed for irritation or diaper changes.     guaiFENesin  (MUCINEX ) 600 MG 12 hr tablet Take 1 tablet (600 mg total) by mouth 2 (two) times daily. (Patient not taking: Reported on 03/18/2024) 60 tablet 2   levothyroxine  (SYNTHROID ) 100 MCG tablet Take 1 tablet (100 mcg total) by mouth daily before breakfast. 30 tablet 0   magnesium  oxide (MAG-OX) 400 (240 Mg) MG tablet Take 1 tablet (400 mg total) by mouth daily. 30 tablet 0   meclizine  (ANTIVERT ) 25 MG tablet Take 25 mg by mouth 3 (three) times daily as needed.     methocarbamol  (ROBAXIN ) 500 MG tablet Take 1 tablet (500 mg total) by mouth every 6 (six) hours as needed for muscle spasms. 20 tablet 0   midodrine  (  PROAMATINE ) 10 MG tablet Take 1 tablet (10 mg total) by mouth 3 (three) times daily. 90 tablet 3   pantoprazole  (PROTONIX ) 40 MG tablet Take 1 tablet (40 mg total) by mouth daily. 30 tablet 5   polyethylene glycol (MIRALAX ) 17 g packet Take 17 g by mouth daily. 30 each 4   Potassium Chloride  ER 20 MEQ TBCR Take 1 tablet (20 mEq total) by mouth 2 (two) times daily. take while taking Lasix /furosemide  60 tablet 1   predniSONE  (DELTASONE ) 10 MG tablet Take 1 tablet (10 mg total) by mouth daily with breakfast. 90 tablet 0   pregabalin  (LYRICA ) 150 MG capsule 2 (two) times daily. (Patient not taking: Reported on 03/18/2024)      rosuvastatin  (CRESTOR ) 5 MG tablet Take 1 tablet (5 mg total) by mouth at bedtime. 30 tablet 0   senna-docusate (SENOKOT-S) 8.6-50 MG tablet Take 2 tablets by mouth at bedtime. 60 tablet 1   valACYclovir  (VALTREX ) 1000 MG tablet Take 1 tablet (1,000 mg total) by mouth daily. (Patient not taking: Reported on 03/18/2024) 30 tablet 0   vitamin B-12 (CYANOCOBALAMIN ) 1000 MCG tablet Take 1 tablet (1,000 mcg total) by mouth daily. 30 tablet 3   Vitamin D , Ergocalciferol , (DRISDOL ) 1.25 MG (50000 UNIT) CAPS capsule Take 1 capsule (50,000 Units total) by mouth every 7 (seven) days. 4 capsule 0   No current facility-administered medications for this visit.    Physical Findings: The patient is in no acute distress. Patient is alert and oriented. Wt Readings from Last 3 Encounters:  03/18/24 260 lb (117.9 kg)  01/02/24 258 lb (117 kg)  12/17/23 257 lb 2 oz (116.6 kg)    vitals were not taken for this visit. .  General: Alert and oriented, in no acute distress HEENT: Head is normocephalic. Extraocular movements are intact. Oropharynx is notable for *** Neck: Neck is notable for *** Skin: Skin in treatment fields shows satisfactory healing *** Heart: Regular in rate and rhythm with no murmurs, rubs, or gallops. Chest: Clear to auscultation bilaterally, with no rhonchi, wheezes, or rales. Abdomen: Soft, nontender, nondistended, with no rigidity or guarding. Extremities: No cyanosis or edema. Lymphatics: see Neck Exam Psychiatric: Judgment and insight are intact. Affect is appropriate.   Lab Findings: Lab Results  Component Value Date   WBC 12.6 (H) 03/11/2024   HGB 18.3 (H) 03/11/2024   HCT 55.0 (H) 03/11/2024   MCV 90.9 03/11/2024   PLT 252 03/11/2024    Lab Results  Component Value Date   TSH 1.340 10/10/2023    Radiographic Findings: CUP PACEART REMOTE DEVICE CHECK Result Date: 03/23/2024 Pacemaker: Scheduled remote reviewed. Normal device function.  Presenting rhythm: AF/VP Known  permanent AF, good V-rate control, Eliquis  per EPIC EMR 1 fast A&V event, c/w NSVT VT, 18 beats, V-rate 182 bpm Next remote transmission per protocol. ML, CVRS   Impression/Plan:  Squamous cell carcinoma the skin of the left cheek; s/p MOHS with positive perineural invasion and adjuvant radiation completed on 02/27/2024  Patient has healed well from the effects of his radiation treatment ***.  He will continue to follow-up with Dr. Robina on ***.  Radiation follow-up as needed.  We appreciate the opportunity to take part in this patient's care.  He was encouraged to call back with any questions or concerns.  On date of service, in total, I spent *** minutes on this encounter. Patient was seen in person. _____________________________________    Leeroy Due, PA-C

## 2024-03-31 ENCOUNTER — Ambulatory Visit: Admitting: Radiology

## 2024-04-07 ENCOUNTER — Ambulatory Visit: Admitting: Radiology

## 2024-04-09 LAB — COMPREHENSIVE METABOLIC PANEL WITH GFR
ALT: 18 IU/L (ref 0–44)
AST: 21 IU/L (ref 0–40)
Albumin: 4.1 g/dL (ref 3.8–4.8)
Alkaline Phosphatase: 114 IU/L (ref 47–123)
BUN/Creatinine Ratio: 13 (ref 10–24)
BUN: 12 mg/dL (ref 8–27)
Bilirubin Total: 1.2 mg/dL (ref 0.0–1.2)
CO2: 30 mmol/L — ABNORMAL HIGH (ref 20–29)
Calcium: 10 mg/dL (ref 8.6–10.2)
Chloride: 97 mmol/L (ref 96–106)
Creatinine, Ser: 0.9 mg/dL (ref 0.76–1.27)
Globulin, Total: 2.6 g/dL (ref 1.5–4.5)
Glucose: 113 mg/dL — ABNORMAL HIGH (ref 70–99)
Potassium: 4.1 mmol/L (ref 3.5–5.2)
Sodium: 143 mmol/L (ref 134–144)
Total Protein: 6.7 g/dL (ref 6.0–8.5)
eGFR: 90 mL/min/1.73 (ref >59)

## 2024-04-09 LAB — T4, FREE: Free T4: 1.03 ng/dL (ref 0.82–1.77)

## 2024-04-09 LAB — TSH: TSH: 2.28 u[IU]/mL (ref 0.450–4.500)

## 2024-04-14 ENCOUNTER — Ambulatory Visit: Admitting: "Endocrinology

## 2024-04-17 IMAGING — CT CT ABD-PELV W/ CM
2 of 5 series · 16 of 46 positions shown, 18 images · IV contrast (Omnipaque or Isovue)
Comparison: Multiple priors including most recent PET-CT September 22, 2018 CT abdomen pelvis October 21, 2020

CLINICAL DATA: Left upper quadrant abdominal pain with vomiting.

EXAM:
CT ABDOMEN AND PELVIS WITH CONTRAST
TECHNIQUE: Multidetector CT imaging of the abdomen and pelvis was performed
using the standard protocol following bolus administration of
intravenous contrast.

[Series 2: axial st · axial · 0.87mm/px · z∈[+961,+1416]mm · 13 of 103 slices shown, 15 images]
[im 6/103  soft-tissue]
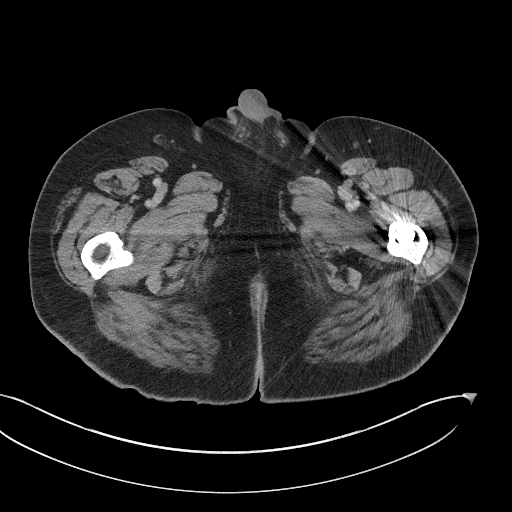
[im 6/103  bone]
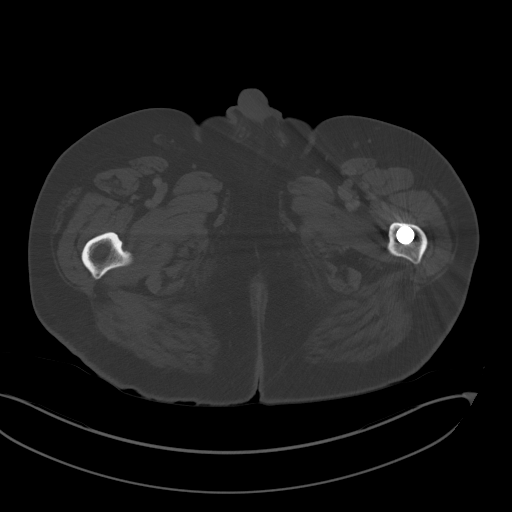
[im 17/103  soft-tissue]
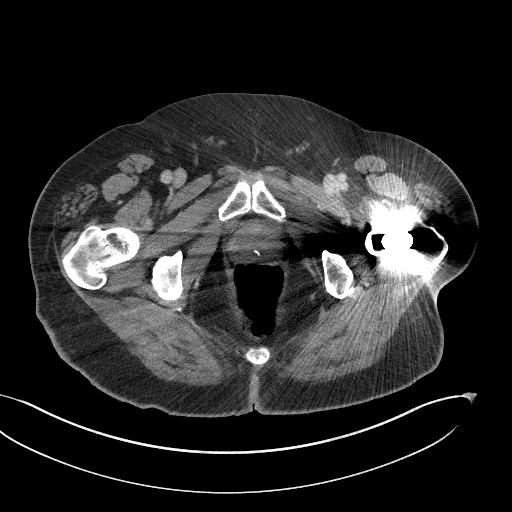
[im 22/103  soft-tissue]
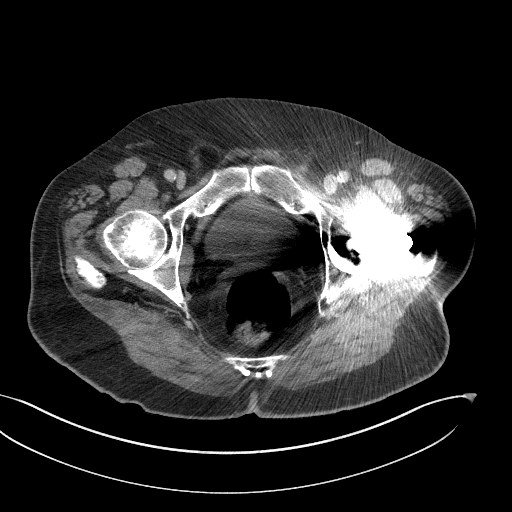
[im 27/103  soft-tissue]
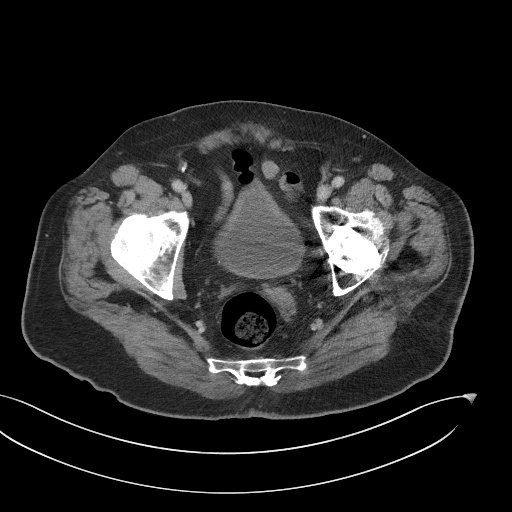
[im 38/103  soft-tissue]
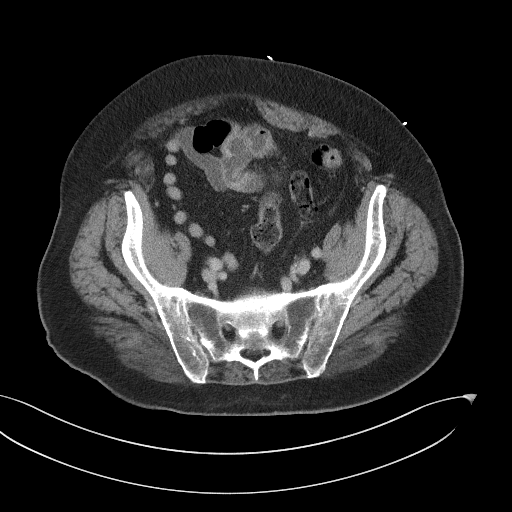
[im 43/103  soft-tissue]
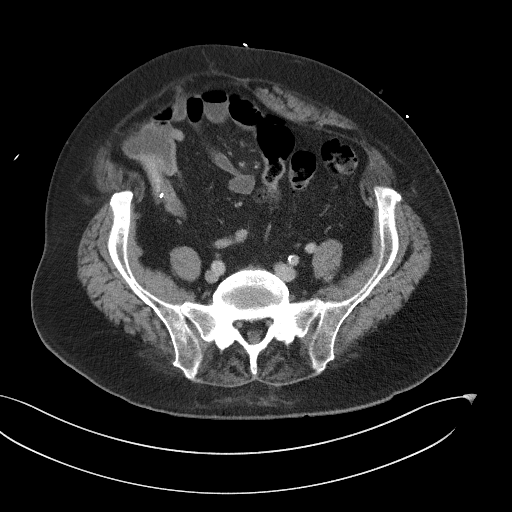
[im 54/103  soft-tissue]
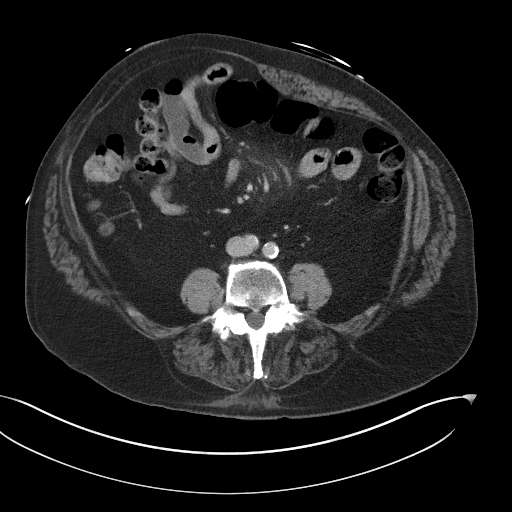
[im 60/103  soft-tissue]
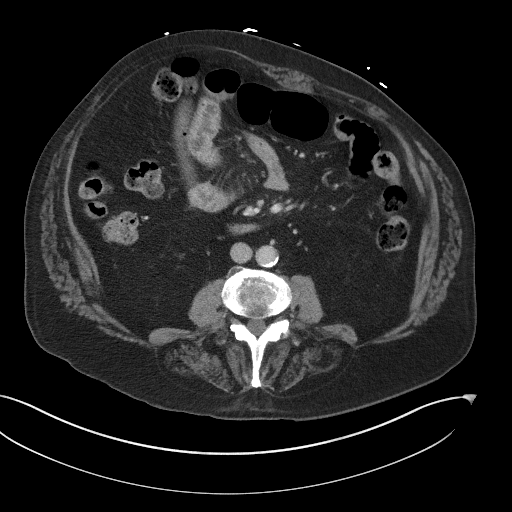
[im 65/103  soft-tissue]
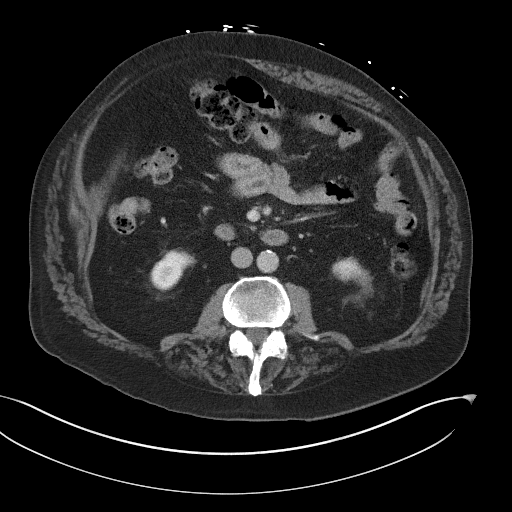
[im 65/103  bone]
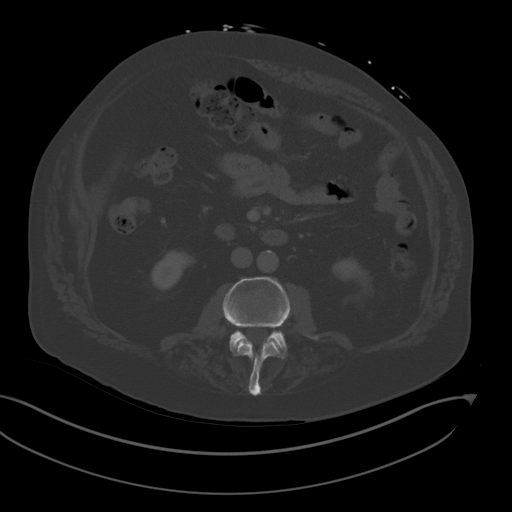
[im 76/103  soft-tissue]
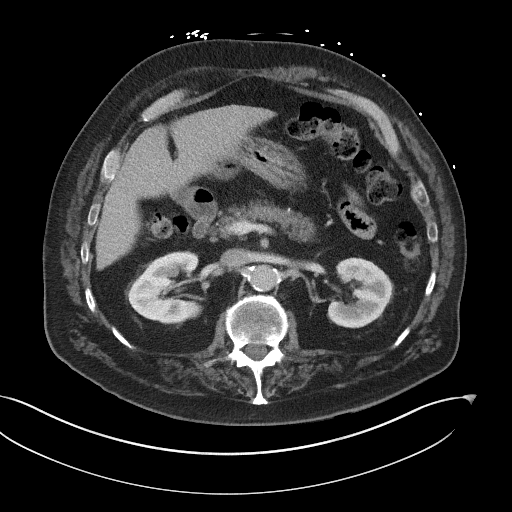
[im 81/103  soft-tissue]
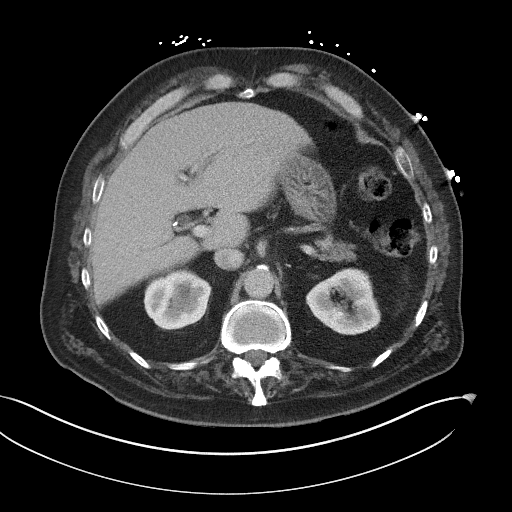
[im 86/103  soft-tissue]
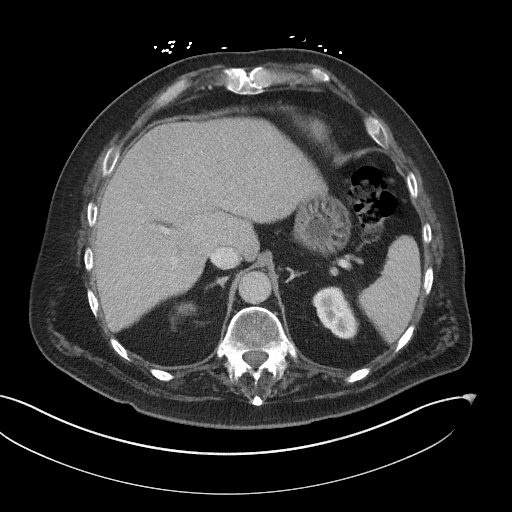
[im 97/103  soft-tissue]
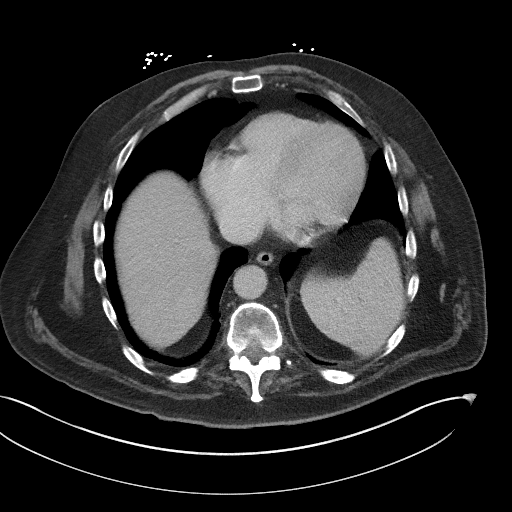

[Series 5: coronal st · coronal · 0.89mm/px · 3 of 130 slices shown]
[im 44/130  soft-tissue]
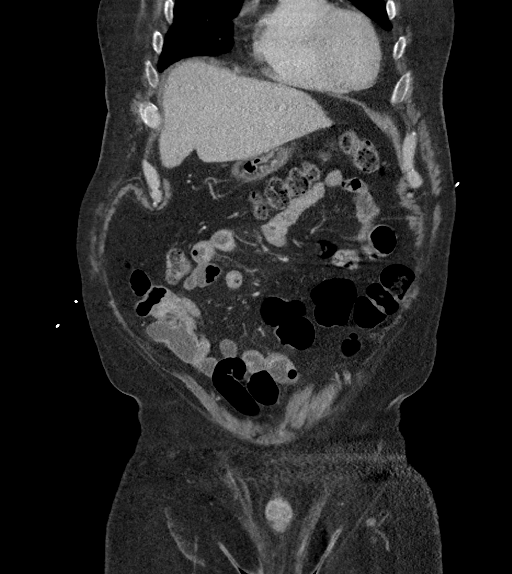
[im 58/130  soft-tissue]
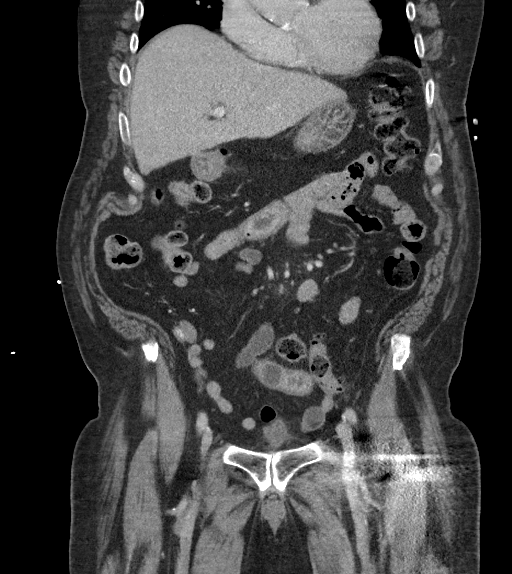
[im 72/130  soft-tissue]
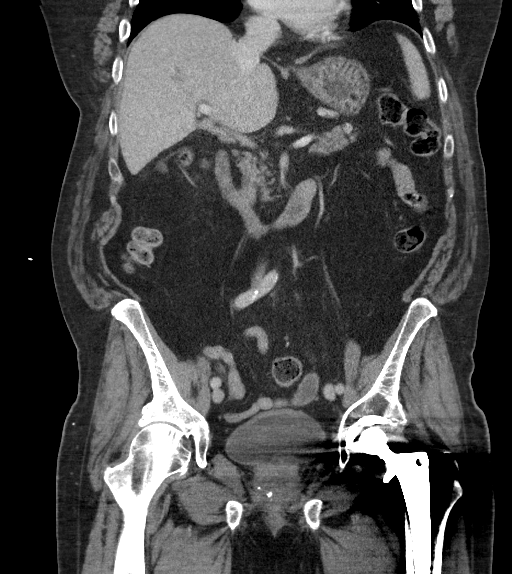

[16 of 46 positions shown; findings below may reference images not displayed]

RADIATION DOSE REDUCTION: This exam was performed according to the
departmental dose-optimization program which includes automated
exposure control, adjustment of the mA and/or kV according to
patient size and/or use of iterative reconstruction technique.

CONTRAST:  100mL OMNIPAQUE IOHEXOL 300 MG/ML  SOLN
FINDINGS: Lower chest: No acute abnormality.

Hepatobiliary: Cirrhotic hepatic morphology. No suspicious hepatic
lesion. Gallbladder surgically absent. Similar mild prominence of
the biliary tree favored reservoir effect post cholecystectomy.

Pancreas: No pancreatic ductal dilation or evidence of acute
inflammation.

Spleen: Splenomegaly or focal splenic lesion.

Adrenals/Urinary Tract: Bilateral adrenal glands appear normal.
Hypodense left upper pole renal lesion is technically too small to
accurately characterize but stable from prior and statistically
likely to reflect a cyst which in the absence of clinically
indicated signs/symptoms require no independent follow-up. No
suspicious renal mass. Urinary bladder is unremarkable for degree of
distension.

Stomach/Bowel: No radiopaque enteric contrast material was
administered. Stomach is unremarkable for degree of distension. No
pathologic dilation of small or large bowel. No evidence of acute
bowel inflammation. Sigmoid colonic diverticulosis without findings
of acute diverticulitis.

Vascular/Lymphatic: Aortic and branch vessel atherosclerosis without
abdominal aortic aneurysm.

Reproductive: Prostatomegaly.

Other: Similar paucity of right abdominal wall musculature. No
significant abdominopelvic free fluid.

Musculoskeletal: Ankylosis of the lower thoracic and lumbar spine
with bilateral sacroiliitis, unchanged from prior. Left total hip
arthroplasty. No acute osseous abnormality.
IMPRESSION: 1. No acute abnormality within the abdomen or pelvis.
2. Sigmoid colonic diverticulosis without findings of acute
diverticulitis.
3. Cirrhotic hepatic morphology with evidence of portal
hypertension.
4. Ankylosis of the lower thoracic and lumbar spine with bilateral
sacroiliitis, unchanged from prior.
5. Aortic Atherosclerosis ([id number]).

## 2024-04-19 NOTE — Progress Notes (Unsigned)
 Cardiology Office Note:  .   Date:  04/19/2024  ID:  Roy [name], DOB 24-Jan-1951, MRN [id number] PCP: Baird [name] [name], NP  [facility] Providers Cardiologist:  None Electrophysiologist:  Will Gladis Norton, MD {  History of Present Illness: .   Roy [name] is a 73 y.o. male w/PMHx of  Hypothyroidism, B cell lymphoma, adrenal insufficiency (prednisone )  HTN CHB w/PPM HFpEF AFib  Most recently saw Roy [name] 01/02/24, was pending radiation tx to his face from 9/16 - 02/20/24 (Squamous cell carcinoma of skin of left cheek) Also requested transition to the [place] office for proximity to their home   Today's visit is scheduled for post XRT pacer check up ROS:   Happy to have completed XRT, mad him sick to his stomach. Done now about a month and feels much better, starting to get the feeling back to that side of his face again. Follows closely with his oncology team Sees his PMD next month  No CP, palpitations or cardiac awareness No bleeding or signs of bleeding, unclear why his Eliquis  dose was reduced, he recalls sometime ~ April, about the time he was in rehab.  He has pretty terrible b/l LE neuropathy, ambulates at home with walker, wheelchair when out/about, has fallen, but this is nt frequent and none in months  No near syncope or syncope No rest SOB, some DOE   Device information MDT dual chamber PPM implanted 09/07/22  Arrhythmia/AAD hx AFib  Studies Reviewed: SABRA    EKG not done today  DEVICE interrogation done today and reviewed by myself Battery and lead measurements are good 100% AF Essentially device dependent Very slow escape today only   07/04/23: TTE 1. Left ventricular ejection fraction, by estimation, is 55 to 60%. The  left ventricle has normal function. The left ventricle demonstrates  regional wall motion abnormalities (see scoring diagram/findings for  description). There is mild concentric left  ventricular hypertrophy.  Left ventricular diastolic parameters are  indeterminate.   2. Right ventricular systolic function is low normal. The right  ventricular size is normal. There is normal pulmonary artery systolic  pressure. The estimated right ventricular systolic pressure is 20.6 mmHg.   3. Left atrial size was mild to moderately dilated.   4. Right atrial size was mild to moderately dilated.   5. The mitral valve is degenerative. Trivial mitral valve regurgitation.   6. The aortic valve is tricuspid. There is moderate calcification of the  aortic valve. Aortic valve regurgitation is trivial. Moderate aortic valve  stenosis. Aortic valve mean gradient measures 24.0 mmHg. Dimentionless  index 0.28.   7. Aortic dilatation noted. There is borderline dilatation of the aortic  root, measuring 39 mm.   8. The inferior vena cava is normal in size with greater than 50%  respiratory variability, suggesting right atrial pressure of 3 mmHg.   Comparison(s): Prior images reviewed side by side. LVEF 55-60% with  indeterminate diastolic function. Moderate aortic stenosis.    09/06/22: TTE 1. Left ventricular ejection fraction, by estimation, is 60 to 65%. The  left ventricle has normal function. The left ventricle has no regional  wall motion abnormalities. There is mild left ventricular hypertrophy.  Left ventricular diastolic function  could not be evaluated.   2. Right ventricular systolic function is normal. The right ventricular  size is mildly enlarged.   3. Left atrial size was mildly dilated.   4. The mitral valve is abnormal. Trivial mitral valve regurgitation.  Moderate mitral annular  calcification.   5. The aortic valve is tricuspid. There is moderate calcification of the  aortic valve. Aortic valve regurgitation is trivial. Mild aortic valve  stenosis. Aortic valve mean gradient measures 26.0 mmHg. Aortic valve Vmax  measures 3.15 m/s.   6. Aortic dilatation noted. There is mild dilatation of the  ascending  aorta, measuring 41 mm.   7. Rhythm strip during this exam demonstrates afib with slow ventricular  response - HR 30.   Comparison(s): No significant change from prior study. 01/09/2021: LVEF  55-60%, dilated aorta to 40 mm.    Risk Assessment/Calculations:    Physical Exam:   VS:  There were no vitals taken for this visit.   Wt Readings from Last 3 Encounters:  03/18/24 260 lb (117.9 kg)  01/02/24 258 lb (117 kg)  12/17/23 257 lb 2 oz (116.6 kg)    GEN: Well nourished, well developed in no acute distress NECK: No JVD; No carotid bruits CARDIAC: RRR (paced), no murmurs, rubs, gallops RESPIRATORY:  CTA b/l without rales, wheezing or rhonchi  ABDOMEN: Soft, non-tender, non-distended EXTREMITIES: No edema; No deformity   PPM site: is stable, no thinning, fluctuation, tethering  ASSESSMENT AND PLAN: .    PPM intact function no programming changes made Still has port left side, he says they clean it out when he goes to the cancer center and they draw blood from it > no chemo/use otherwise  Discussed infection risks elevated with the port, if not using, we would prefer it be removed. He will discuss with his oncology team Ill send my note as well  Longstanding persistent AFib CHA2DS2Vasc is 2, on eliquis , in-appropriately dosed controlled rates/paced Asymptomatic  Increase Eliquis  to 5mg  BID   VHD Mod AS on his feb 2025 echo   Secondary hypercoagulable state 2/2 AFib    Dispo: [name] office is much more convenient for him, asks about going there, will transition to [name], have him see him there in 45mo, remotes otherwise as usual  Signed, Areebah Meinders [name], PA-C

## 2024-04-21 ENCOUNTER — Ambulatory Visit: Attending: Physician Assistant | Admitting: Physician Assistant

## 2024-04-21 ENCOUNTER — Ambulatory Visit: Admitting: Radiology

## 2024-04-21 VITALS — BP 128/98 | HR 84 | Ht 72.0 in | Wt 263.0 lb

## 2024-04-21 DIAGNOSIS — Z950 Presence of cardiac pacemaker: Secondary | ICD-10-CM

## 2024-04-21 DIAGNOSIS — I4811 Longstanding persistent atrial fibrillation: Secondary | ICD-10-CM

## 2024-04-21 DIAGNOSIS — D6869 Other thrombophilia: Secondary | ICD-10-CM

## 2024-04-21 LAB — CUP PACEART INCLINIC DEVICE CHECK
Battery Remaining Longevity: 133 mo
Battery Voltage: 3.02 V
Brady Statistic RA Percent Paced: 0 %
Brady Statistic RV Percent Paced: 98.97 %
Date Time Interrogation Session: 20251216113826
Implantable Lead Connection Status: 753985
Implantable Lead Connection Status: 753985
Implantable Lead Implant Date: 20240503
Implantable Lead Implant Date: 20240503
Implantable Lead Location: 753859
Implantable Lead Location: 753860
Implantable Lead Model: 3830
Implantable Lead Model: 5076
Implantable Pulse Generator Implant Date: 20240503
Lead Channel Impedance Value: 437 Ohm
Lead Channel Impedance Value: 456 Ohm
Lead Channel Impedance Value: 665 Ohm
Lead Channel Impedance Value: 665 Ohm
Lead Channel Pacing Threshold Amplitude: 1 V
Lead Channel Pacing Threshold Pulse Width: 0.4 ms
Lead Channel Sensing Intrinsic Amplitude: 1.875 mV
Lead Channel Sensing Intrinsic Amplitude: 2.5 mV
Lead Channel Sensing Intrinsic Amplitude: 24.25 mV
Lead Channel Sensing Intrinsic Amplitude: 24.25 mV
Lead Channel Setting Pacing Amplitude: 2.5 V
Lead Channel Setting Pacing Amplitude: 2.5 V
Lead Channel Setting Pacing Pulse Width: 0.4 ms
Lead Channel Setting Sensing Sensitivity: 2.8 mV
Zone Setting Status: 755011
Zone Setting Status: 755011

## 2024-04-21 MED ORDER — APIXABAN 5 MG PO TABS: 5.0000 mg | ORAL_TABLET | Freq: Two times a day (BID) | ORAL | 3 refills | Status: AC

## 2024-04-21 NOTE — Patient Instructions (Signed)
 Medication Instructions:   START TAKING :   ELIQUIS  5 MG TWICE  A  DAY    *If you need a refill on your cardiac medications before your next appointment, please call your pharmacy*    Lab Work: NONE ORDERED  TODAY     If you have labs (blood work) drawn today and your tests are completely normal, you will receive your results only by: MyChart Message (if you have MyChart) OR A paper copy in the mail If you have any lab test that is abnormal or we need to change your treatment, we will call you to review the results.   Testing/Procedures: NONE ORDERED  TODAY     Follow-Up:  At [facility], you and your health needs are our priority.  As part of our continuing mission to provide you with exceptional heart care, our providers are all part of one team.  This team includes your primary Cardiologist (physician) and Advanced Practice Providers or APPs (Physician Assistants and Nurse Practitioners) who all work together to provide you with the care you need, when you need it.  Your next appointment:    6 month(s)   Provider:    Dr Almetta    We recommend signing up for the patient portal called MyChart.  Sign up information is provided on this After Visit Summary.  MyChart is used to connect with patients for Virtual Visits (Telemedicine).  Patients are able to view lab/test results, encounter notes, upcoming appointments, etc.  Non-urgent messages can be sent to your provider as well.   To learn more about what you can do with MyChart, go to [email or web address].   Other Instructions

## 2024-04-21 NOTE — Addendum Note (Signed)
 Addended by: RUBBIE KERRI HERO on: 04/21/2024 10:21 AM   Modules accepted: Orders

## 2024-05-04 ENCOUNTER — Encounter: Payer: Self-pay | Admitting: *Deleted

## 2024-05-05 ENCOUNTER — Other Ambulatory Visit: Payer: Self-pay | Admitting: "Endocrinology

## 2024-05-05 DIAGNOSIS — E2740 Unspecified adrenocortical insufficiency: Secondary | ICD-10-CM

## 2024-05-05 NOTE — Progress Notes (Incomplete)
 Pain issues, if any: *** Skin changes, if any: Weight changes, if any: *** Swallowing issues, if any: *** Smoking or chewing tobacco? *** Using fluoride toothpaste daily? *** Last ENT visit was on: *** Other notable issues, if any: ***

## 2024-05-14 ENCOUNTER — Ambulatory Visit: Payer: 59 | Admitting: Urology

## 2024-05-19 ENCOUNTER — Ambulatory Visit: Admitting: Radiation Oncology

## 2024-05-19 ENCOUNTER — Ambulatory Visit: Admitting: Radiology

## 2024-05-28 NOTE — Progress Notes (Incomplete)
 Pain issues, if any: *** Skin changes, if any: Weight changes, if any: *** Swallowing issues, if any: *** Smoking or chewing tobacco? *** Using fluoride toothpaste daily? *** Last ENT visit was on: *** Other notable issues, if any: ***

## 2024-06-08 ENCOUNTER — Ambulatory Visit: Admitting: Urology

## 2024-06-09 ENCOUNTER — Telehealth: Payer: Self-pay | Admitting: Radiation Oncology

## 2024-06-09 ENCOUNTER — Ambulatory Visit: Admitting: Radiation Oncology

## 2024-06-10 ENCOUNTER — Other Ambulatory Visit: Payer: Self-pay | Admitting: Oncology

## 2024-06-10 NOTE — Progress Notes (Signed)
 Pain issues, if any: *** Skin changes, if any: Weight changes, if any: *** Swallowing issues, if any: *** Smoking or chewing tobacco? *** Using fluoride toothpaste daily? *** Last ENT visit was on: *** Other notable issues, if any: ***

## 2024-06-15 ENCOUNTER — Ambulatory Visit: Admission: RE | Admit: 2024-06-15 | Admitting: Radiation Oncology

## 2024-09-16 ENCOUNTER — Inpatient Hospital Stay

## 2024-09-21 ENCOUNTER — Ambulatory Visit: Admitting: Urology

## 2024-09-21 ENCOUNTER — Ambulatory Visit

## 2024-09-23 ENCOUNTER — Inpatient Hospital Stay: Admitting: Oncology

## 2024-12-21 ENCOUNTER — Ambulatory Visit

## 2025-03-22 ENCOUNTER — Ambulatory Visit

## 2025-06-21 ENCOUNTER — Ambulatory Visit

## 2025-09-20 ENCOUNTER — Ambulatory Visit

## 2025-12-20 ENCOUNTER — Ambulatory Visit

## 2026-03-21 ENCOUNTER — Ambulatory Visit

## 2026-06-20 ENCOUNTER — Ambulatory Visit
